# Patient Record
Sex: Male | Born: 1975 | Race: Black or African American | Hispanic: No | Marital: Single | Smoking: Former smoker
Health system: Southern US, Community
[De-identification: ages and names within clinical notes are randomized; demographics above are authoritative.]

## PROBLEM LIST (undated history)

## (undated) DIAGNOSIS — I639 Cerebral infarction, unspecified: Secondary | ICD-10-CM

## (undated) DIAGNOSIS — E119 Type 2 diabetes mellitus without complications: Secondary | ICD-10-CM

## (undated) DIAGNOSIS — I10 Essential (primary) hypertension: Secondary | ICD-10-CM

---

## 2019-08-06 ENCOUNTER — Inpatient Hospital Stay: Admission: RE | Admit: 2019-08-06 | Payer: Self-pay | Source: Ambulatory Visit

## 2019-08-21 ENCOUNTER — Encounter (INDEPENDENT_AMBULATORY_CARE_PROVIDER_SITE_OTHER): Payer: Self-pay | Admitting: Primary Care

## 2019-08-21 ENCOUNTER — Telehealth (INDEPENDENT_AMBULATORY_CARE_PROVIDER_SITE_OTHER): Payer: Self-pay | Admitting: Primary Care

## 2019-08-21 ENCOUNTER — Other Ambulatory Visit: Payer: Self-pay

## 2019-08-21 ENCOUNTER — Ambulatory Visit (INDEPENDENT_AMBULATORY_CARE_PROVIDER_SITE_OTHER): Payer: Self-pay | Admitting: Primary Care

## 2019-08-21 ENCOUNTER — Other Ambulatory Visit (INDEPENDENT_AMBULATORY_CARE_PROVIDER_SITE_OTHER): Payer: Self-pay | Admitting: Primary Care

## 2019-08-21 VITALS — BP 201/145 | HR 97 | Temp 98.2°F | Ht 72.0 in | Wt 273.2 lb

## 2019-08-21 DIAGNOSIS — Z6835 Body mass index (BMI) 35.0-35.9, adult: Secondary | ICD-10-CM

## 2019-08-21 DIAGNOSIS — Z13220 Encounter for screening for lipoid disorders: Secondary | ICD-10-CM

## 2019-08-21 DIAGNOSIS — Z125 Encounter for screening for malignant neoplasm of prostate: Secondary | ICD-10-CM

## 2019-08-21 DIAGNOSIS — Z23 Encounter for immunization: Secondary | ICD-10-CM

## 2019-08-21 DIAGNOSIS — Z7689 Persons encountering health services in other specified circumstances: Secondary | ICD-10-CM

## 2019-08-21 DIAGNOSIS — Z716 Tobacco abuse counseling: Secondary | ICD-10-CM

## 2019-08-21 DIAGNOSIS — I10 Essential (primary) hypertension: Secondary | ICD-10-CM

## 2019-08-21 DIAGNOSIS — F329 Major depressive disorder, single episode, unspecified: Secondary | ICD-10-CM

## 2019-08-21 DIAGNOSIS — Z0000 Encounter for general adult medical examination without abnormal findings: Secondary | ICD-10-CM

## 2019-08-21 DIAGNOSIS — E6609 Other obesity due to excess calories: Secondary | ICD-10-CM

## 2019-08-21 DIAGNOSIS — F32A Depression, unspecified: Secondary | ICD-10-CM

## 2019-08-21 DIAGNOSIS — E669 Obesity, unspecified: Secondary | ICD-10-CM | POA: Insufficient documentation

## 2019-08-21 MED ORDER — AMLODIPINE BESYLATE 10 MG PO TABS: 10.0000 mg | ORAL_TABLET | Freq: Every day | ORAL | 3 refills | Status: DC

## 2019-08-21 MED ORDER — LOSARTAN POTASSIUM-HCTZ 100-25 MG PO TABS: 1.0000 | ORAL_TABLET | Freq: Every day | ORAL | 3 refills | Status: DC

## 2019-08-21 NOTE — Patient Instructions (Signed)
   Managing Your Hypertension Hypertension is commonly called high blood pressure. This is when the force of your blood pressing against the walls of your arteries is too strong. Arteries are blood vessels that carry blood from your heart throughout your body. Hypertension forces the heart to work harder to pump blood, and may cause the arteries to become narrow or stiff. Having untreated or uncontrolled hypertension can cause heart attack, stroke, kidney disease, and other problems. What are blood pressure readings? A blood pressure reading consists of a higher number over a lower number. Ideally, your blood pressure should be below 120/80. The first ("top") number is called the systolic pressure. It is a measure of the pressure in your arteries as your heart beats. The second ("bottom") number is called the diastolic pressure. It is a measure of the pressure in your arteries as the heart relaxes. What does my blood pressure reading mean? Blood pressure is classified into four stages. Based on your blood pressure reading, your health care provider may use the following stages to determine what type of treatment you need, if any. Systolic pressure and diastolic pressure are measured in a unit called mm Hg. Normal  Systolic pressure: below 120.  Diastolic pressure: below 80. Elevated  Systolic pressure: 120-129.  Diastolic pressure: below 80. Hypertension stage 1  Systolic pressure: 130-139.  Diastolic pressure: 80-89. Hypertension stage 2  Systolic pressure: 140 or above.  Diastolic pressure: 90 or above. What health risks are associated with hypertension? Managing your hypertension is an important responsibility. Uncontrolled hypertension can lead to:  A heart attack.  A stroke.  A weakened blood vessel (aneurysm).  Heart failure.  Kidney damage.  Eye damage.  Metabolic syndrome.  Memory and concentration problems. What changes can I make to manage my  hypertension? Hypertension can be managed by making lifestyle changes and possibly by taking medicines. Your health care provider will help you make a plan to bring your blood pressure within a normal range. Eating and drinking   Eat a diet that is high in fiber and potassium, and low in salt (sodium), added sugar, and fat. An example eating plan is called the DASH (Dietary Approaches to Stop Hypertension) diet. To eat this way: ? Eat plenty of fresh fruits and vegetables. Try to fill half of your plate at each meal with fruits and vegetables. ? Eat whole grains, such as whole wheat pasta, brown rice, or whole grain bread. Fill about one quarter of your plate with whole grains. ? Eat low-fat diary products. ? Avoid fatty cuts of meat, processed or cured meats, and poultry with skin. Fill about one quarter of your plate with lean proteins such as fish, chicken without skin, beans, eggs, and tofu. ? Avoid premade and processed foods. These tend to be higher in sodium, added sugar, and fat.  Reduce your daily sodium intake. Most people with hypertension should eat less than 1,500 mg of sodium a day.  Limit alcohol intake to no more than 1 drink a day for nonpregnant women and 2 drinks a day for men. One drink equals 12 oz of beer, 5 oz of wine, or 1 oz of hard liquor. Lifestyle  Work with your health care provider to maintain a healthy body weight, or to lose weight. Ask what an ideal weight is for you.  Get at least 30 minutes of exercise that causes your heart to beat faster (aerobic exercise) most days of the week. Activities may include walking, swimming, or biking.    Include exercise to strengthen your muscles (resistance exercise), such as weight lifting, as part of your weekly exercise routine. Try to do these types of exercises for 30 minutes at least 3 days a week.  Do not use any products that contain nicotine or tobacco, such as cigarettes and e-cigarettes. If you need help quitting,  ask your health care provider.  Control any long-term (chronic) conditions you have, such as high cholesterol or diabetes. Monitoring  Monitor your blood pressure at home as told by your health care provider. Your personal target blood pressure may vary depending on your medical conditions, your age, and other factors.  Have your blood pressure checked regularly, as often as told by your health care provider. Working with your health care provider  Review all the medicines you take with your health care provider because there may be side effects or interactions.  Talk with your health care provider about your diet, exercise habits, and other lifestyle factors that may be contributing to hypertension.  Visit your health care provider regularly. Your health care provider can help you create and adjust your plan for managing hypertension. Will I need medicine to control my blood pressure? Your health care provider may prescribe medicine if lifestyle changes are not enough to get your blood pressure under control, and if:  Your systolic blood pressure is 130 or higher.  Your diastolic blood pressure is 80 or higher. Take medicines only as told by your health care provider. Follow the directions carefully. Blood pressure medicines must be taken as prescribed. The medicine does not work as well when you skip doses. Skipping doses also puts you at risk for problems. Contact a health care provider if:  You think you are having a reaction to medicines you have taken.  You have repeated (recurrent) headaches.  You feel dizzy.  You have swelling in your ankles.  You have trouble with your vision. Get help right away if:  You develop a severe headache or confusion.  You have unusual weakness or numbness, or you feel faint.  You have severe pain in your chest or abdomen.  You vomit repeatedly.  You have trouble breathing. Summary  Hypertension is when the force of blood pumping  through your arteries is too strong. If this condition is not controlled, it may put you at risk for serious complications.  Your personal target blood pressure may vary depending on your medical conditions, your age, and other factors. For most people, a normal blood pressure is less than 120/80.  Hypertension is managed by lifestyle changes, medicines, or both. Lifestyle changes include weight loss, eating a healthy, low-sodium diet, exercising more, and limiting alcohol. This information is not intended to replace advice given to you by your health care provider. Make sure you discuss any questions you have with your health care provider. Document Revised: 04/26/2018 Document Reviewed: 12/01/2015 Elsevier Patient Education  2020 [employer]  

## 2019-08-21 NOTE — Telephone Encounter (Signed)
Medications too expensive  for patient .

## 2019-08-21 NOTE — Telephone Encounter (Signed)
Copied from CRM [id number]. Topic: General - Other >> Aug 21, 2019 10:14 AM [name] wrote: Reason for CRM: Patient called to inform the nurse or doctor that when he went to get his medication from the pharmacy, it was over $300.  He stated he has never paid that much for his medication before.  Please advise and call patient to discuss at [phone]

## 2019-08-21 NOTE — Progress Notes (Signed)
New Patient Office Visit  Subjective:  Patient ID: Nathaniel Spears, male    DOB: 03/03/1975  Age: 44 y.o. MRN: [id number]  CC:  Chief Complaint  Patient presents with  . New Patient (Initial Visit)    hypertension   . Cough    HPI [name] is 44 year old obese male who presents for establishment of care and management of high blood pressure. He denies shortness of breath, headaches, chest pain or lower extremity edema History reviewed. No pertinent past medical history.  History reviewed. No pertinent family history.  Social History   Socioeconomic History  . Marital status: Divorced    Spouse name: Not on file  . Number of children: Not on file  . Years of education: Not on file  . Highest education level: Not on file  Occupational History  . Not on file  Tobacco Use  . Smoking status: Current Every Day Smoker  . Smokeless tobacco: Never Used  Substance and Sexual Activity  . Alcohol use: Not Currently  . Drug use: Never  . Sexual activity: Not Currently  Other Topics Concern  . Not on file  Social History Narrative  . Not on file   Social Determinants of Health   Financial Resource Strain:   . Difficulty of Paying Living Expenses:   Food Insecurity:   . Worried About Charity fundraiser in the Last Year:   . Arboriculturist in the Last Year:   Transportation Needs:   . Film/video editor (Medical):   Marland Kitchen Lack of Transportation (Non-Medical):   Physical Activity:   . Days of Exercise per Week:   . Minutes of Exercise per Session:   Stress:   . Feeling of Stress :   Social Connections:   . Frequency of Communication with Friends and Family:   . Frequency of Social Gatherings with Friends and Family:   . Attends Religious Services:   . Active Member of Clubs or Organizations:   . Attends Archivist Meetings:   Marland Kitchen Marital Status:   Intimate Partner Violence:   . Fear of Current or Ex-Partner:   . Emotionally Abused:   Marland Kitchen Physically  Abused:   . Sexually Abused:     ROS Review of Systems  All other systems reviewed and are negative.   Objective:  BP (!) 201/145 (BP Location: Right Arm, Patient Position: Sitting, Cuff Size: Large)   Pulse 97   Temp 98.2 F (36.8 C) (Oral)   Ht 6' (1.829 m)   Wt 273 lb 3.2 oz (123.9 kg)   SpO2 94%   BMI 37.05 kg/m    Physical Exam Vitals reviewed.  Constitutional:      Appearance: He is obese.  HENT:     Head: Normocephalic.     Right Ear: Tympanic membrane normal.     Left Ear: Tympanic membrane normal.     Nose: Nose normal.  Cardiovascular:     Rate and Rhythm: Normal rate and regular rhythm.     Pulses: Normal pulses.     Heart sounds: Normal heart sounds.  Pulmonary:     Effort: Pulmonary effort is normal.     Breath sounds: Normal breath sounds.  Abdominal:     General: Bowel sounds are normal. There is distension.  Musculoskeletal:        General: Normal range of motion.     Cervical back: Normal range of motion and neck supple.  Skin:    General:  Skin is warm and dry.  Neurological:     Mental Status: He is alert and oriented to person, place, and time.    Assessment & Plan:  Nathaniel Spears was seen today for new patient (initial visit) and cough.  Diagnoses and all orders for this visit:  Encounter to establish care Juluis Mire, NP-C will be your  (PCP) she is mastered prepared . Able to diagnosed and treatment also  answer health concern as well as continuing care of varied medical conditions, not limited by cause, organ system, or diagnosis.   Need for Tdap vaccination -     Cancel: Tdap vaccine greater than or equal to 7yo IM  Depression, unspecified depression type Recently divorces and moved from [place]. Transition alone and new environment . He has a job interview this week he is looking forward too   Office Visit from 08/21/2019 in [facility]  PHQ-9 Total Score 18      Lipid screening -     Lipid Panel;  Future   Screening PSA (prostate specific antigen Prostrate Cancer Screening For men aged 44 to 56 years, the decision to undergo periodic prostate-specific antigen (PSA)-based screening for prostate cancer  -     PSA; Future   Essential hypertension Counseled on blood pressure goal of less than 130/80, low-sodium, DASH diet, medication compliance, 150 minutes of moderate intensity exercise per week. Discussed medication compliance, adverse effects. -     CBC with Differential; Future -     CMP14+EGFR; Future -     amLODipine (NORVASC) 10 MG tablet; Take 1 tablet (10 mg total) by mouth daily. -     losartan-hydrochlorothiazide (HYZAAR) 100-25 MG tablet; Take 1 tablet by mouth daily. -     CMP14+EGFR -     CBC with Differential  Tobacco abuse counseling  He is aware of Increased risk for lung cancer and other respiratory diseases recommend cessation.  This will be reminded at each clinical visit. Not helping with uncontrolled Bp either   Healthcare maintenance -     Hepatitis C Antibody; Future -     HIV Antibody (routine testing w rflx); Future -     HIV Antibody (routine testing w rflx) -     Hepatitis C Antibody  Class 2 obesity due to excess calories without serious comorbidity with body mass index (BMI) of 35.0 to 35.9 in adult Obesity is 30-39 indicating an excess in caloric intake or underlining conditions. This may lead to other co-morbidities. Lifestyle modifications of diet and exercise may reduce obesity.  AA, male, obese uncontrolled HTN risk for heart attack or stroke or both    Follow-up: Return in about 8 weeks (around 10/16/2019) for in person BP/fasting labs.   Kerin Perna, NP

## 2019-08-22 ENCOUNTER — Other Ambulatory Visit (INDEPENDENT_AMBULATORY_CARE_PROVIDER_SITE_OTHER): Payer: Self-pay | Admitting: Primary Care

## 2019-08-22 DIAGNOSIS — I10 Essential (primary) hypertension: Secondary | ICD-10-CM

## 2019-08-22 MED ORDER — HYDROCHLOROTHIAZIDE 25 MG PO TABS: 25.0000 mg | ORAL_TABLET | Freq: Every day | ORAL | 3 refills | Status: DC

## 2019-08-22 MED ORDER — LOSARTAN POTASSIUM 100 MG PO TABS: 100.0000 mg | ORAL_TABLET | Freq: Every day | ORAL | 3 refills | Status: DC

## 2019-08-25 MED FILL — AMLODIPINE BESYLATE 10 MG T: 10 | 30 days supply | Qty: 30 | Fill #0

## 2019-08-25 MED FILL — LOSARTAN-HCTZ 100-25 MG TAB: 100-25 | 30 days supply | Qty: 30 | Fill #0

## 2019-08-25 NOTE — Telephone Encounter (Signed)
Patient contacted [facility] and had them transfer prescriptions to [facility].

## 2019-10-01 MED FILL — LOSARTAN-HCTZ 100-25 MG TAB: 100-25 | 30 days supply | Qty: 30 | Fill #1

## 2019-10-01 MED FILL — AMLODIPINE BESYLATE 10 MG T: 10 | 30 days supply | Qty: 30 | Fill #1

## 2019-10-08 MED FILL — LOSARTAN-HCTZ 100-25 MG TAB: 100-25 | 30 days supply | Qty: 30 | Fill #1

## 2019-10-08 MED FILL — AMLODIPINE BESYLATE 10 MG T: 10 | 30 days supply | Qty: 30 | Fill #1

## 2019-10-16 ENCOUNTER — Ambulatory Visit (INDEPENDENT_AMBULATORY_CARE_PROVIDER_SITE_OTHER): Payer: Self-pay | Admitting: Primary Care

## 2019-10-28 ENCOUNTER — Ambulatory Visit (INDEPENDENT_AMBULATORY_CARE_PROVIDER_SITE_OTHER): Payer: Self-pay | Admitting: Primary Care

## 2019-12-16 MED FILL — LOSARTAN POTASSIUM-HCTZ 100: 100-25 | 30 days supply | Qty: 30 | Fill #2

## 2019-12-16 MED FILL — AMLODIPINE BESYLATE 10 MG T: 10 | 30 days supply | Qty: 30 | Fill #2

## 2020-01-27 MED FILL — LOSARTAN POTASSIUM-HCTZ 100: 100-25 | 30 days supply | Qty: 30 | Fill #3

## 2020-01-27 MED FILL — AMLODIPINE BESYLATE 10 MG T: 10 | 30 days supply | Qty: 30 | Fill #3

## 2020-02-10 ENCOUNTER — Emergency Department (HOSPITAL_COMMUNITY): Payer: No Typology Code available for payment source

## 2020-02-10 ENCOUNTER — Emergency Department (HOSPITAL_COMMUNITY)
Admission: EM | Admit: 2020-02-10 | Discharge: 2020-02-11 | Disposition: A | Discharge status: Home / Self Care | Payer: No Typology Code available for payment source | Attending: Emergency Medicine | Admitting: Emergency Medicine

## 2020-02-10 ENCOUNTER — Other Ambulatory Visit: Payer: Self-pay

## 2020-02-10 DIAGNOSIS — Z5321 Procedure and treatment not carried out due to patient leaving prior to being seen by health care provider: Secondary | ICD-10-CM | POA: Insufficient documentation

## 2020-02-10 DIAGNOSIS — R42 Dizziness and giddiness: Secondary | ICD-10-CM | POA: Diagnosis not present

## 2020-02-10 DIAGNOSIS — R519 Headache, unspecified: Secondary | ICD-10-CM | POA: Diagnosis not present

## 2020-02-10 LAB — CBC
HCT: 47.1 % (ref 39.0–52.0)
Hemoglobin: 14.9 g/dL (ref 13.0–17.0)
MCH: 26.2 pg (ref 26.0–34.0)
MCHC: 31.6 g/dL (ref 30.0–36.0)
MCV: 82.9 fL (ref 80.0–100.0)
Platelets: 219 10*3/uL (ref 150–400)
RBC: 5.68 MIL/uL (ref 4.22–5.81)
RDW: 12.9 % (ref 11.5–15.5)
WBC: 4.9 10*3/uL (ref 4.0–10.5)
nRBC: 0 % (ref 0.0–0.2)

## 2020-02-10 LAB — COMPREHENSIVE METABOLIC PANEL
ALT: 20 U/L (ref 0–44)
AST: 20 U/L (ref 15–41)
Albumin: 3.8 g/dL (ref 3.5–5.0)
Alkaline Phosphatase: 74 U/L (ref 38–126)
Anion gap: 12 (ref 5–15)
BUN: 8 mg/dL (ref 6–20)
CO2: 25 mmol/L (ref 22–32)
Calcium: 9 mg/dL (ref 8.9–10.3)
Chloride: 103 mmol/L (ref 98–111)
Creatinine, Ser: 1.08 mg/dL (ref 0.61–1.24)
GFR, Estimated: >60 mL/min (ref >60)
Glucose, Bld: 191 mg/dL — ABNORMAL HIGH (ref 70–99)
Potassium: 3.3 mmol/L — ABNORMAL LOW (ref 3.5–5.1)
Sodium: 140 mmol/L (ref 135–145)
Total Bilirubin: 0.5 mg/dL (ref 0.3–1.2)
Total Protein: 7.4 g/dL (ref 6.5–8.1)

## 2020-02-10 LAB — APTT: aPTT: 27 seconds (ref 24–36)

## 2020-02-10 LAB — DIFFERENTIAL
Abs Immature Granulocytes: 0.01 10*3/uL (ref 0.00–0.07)
Basophils Absolute: 0 10*3/uL (ref 0.0–0.1)
Basophils Relative: 0 %
Eosinophils Absolute: 0.1 10*3/uL (ref 0.0–0.5)
Eosinophils Relative: 2 %
Immature Granulocytes: 0 %
Lymphocytes Relative: 54 %
Lymphs Abs: 2.6 10*3/uL (ref 0.7–4.0)
Monocytes Absolute: 0.3 10*3/uL (ref 0.1–1.0)
Monocytes Relative: 7 %
Neutro Abs: 1.8 10*3/uL (ref 1.7–7.7)
Neutrophils Relative %: 37 %

## 2020-02-10 LAB — PROTIME-INR
INR: 1 (ref 0.8–1.2)
Prothrombin Time: 12.5 seconds (ref 11.4–15.2)

## 2020-02-10 IMAGING — CT CT HEAD W/O CM
3 series · 15 of 47 positions shown, 18 images · non-contrast
Comparison: None.

CLINICAL DATA: Headache, dizziness

EXAM:
CT HEAD WITHOUT CONTRAST
TECHNIQUE: Contiguous axial images were obtained from the base of the skull
through the vertex without intravenous contrast.

[Series 3: head 5.0 h30s · axial · 0.44mm/px · z∈[-344,-209]mm · 9 of 33 slices shown, 12 images]
[im 3/33  brain]
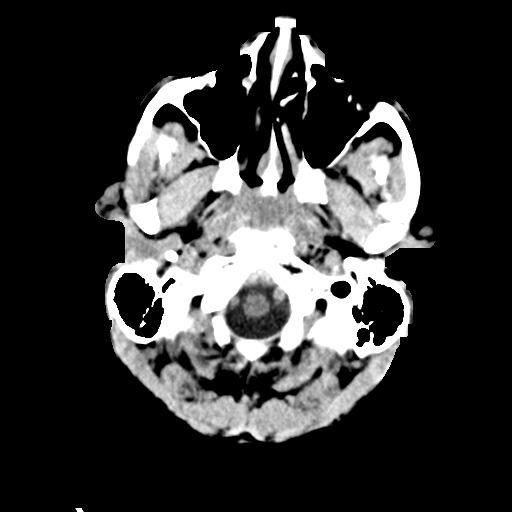
[im 3/33  bone]
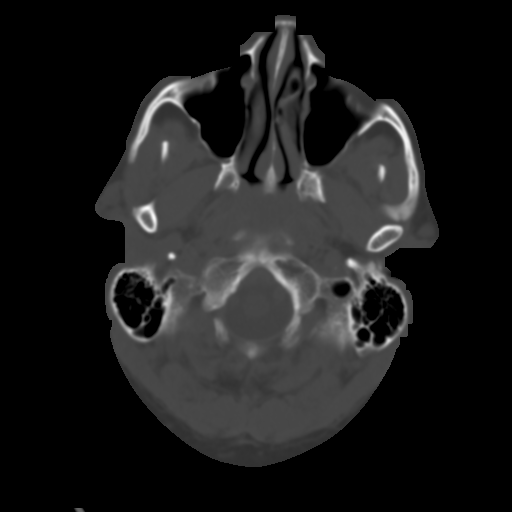
[im 6/33  brain]
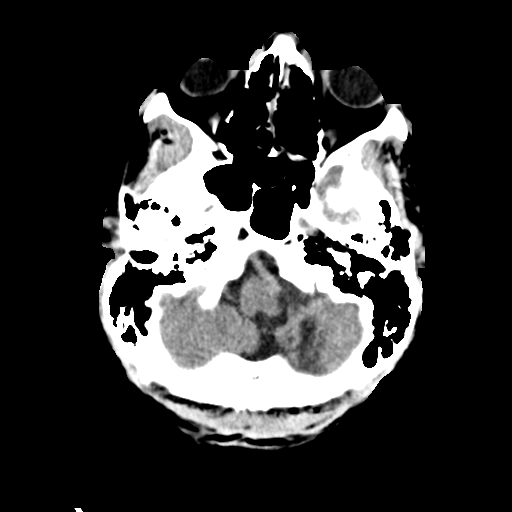
[im 9/33  brain]
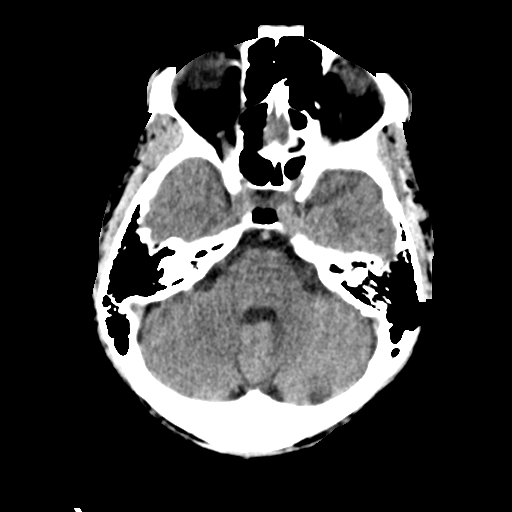
[im 13/33  brain]
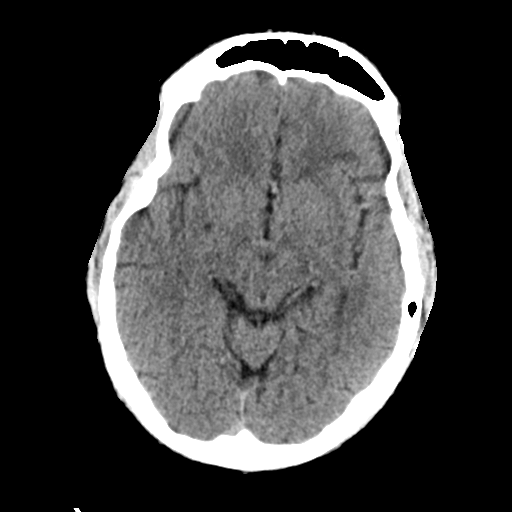
[im 17/33  brain]
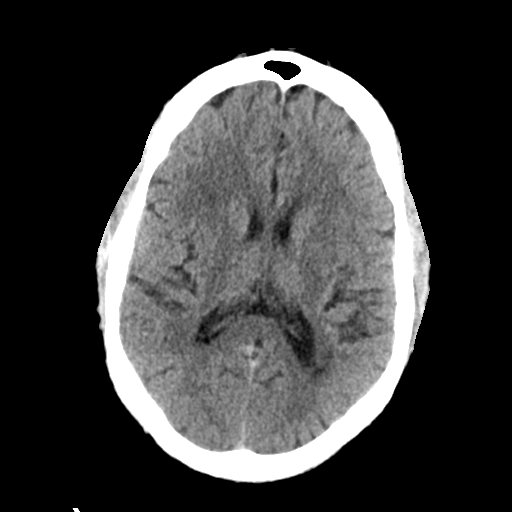
[im 17/33  bone]
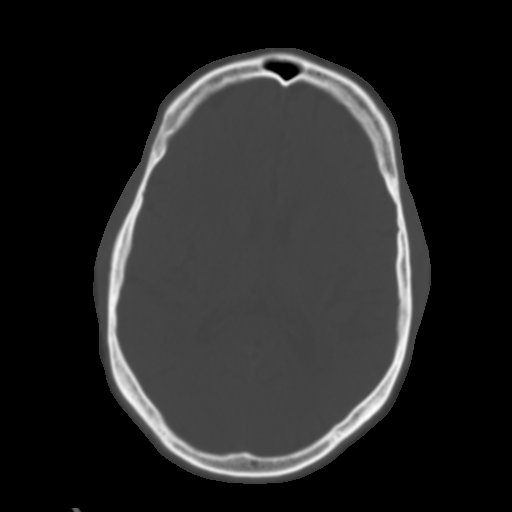
[im 20/33  brain]
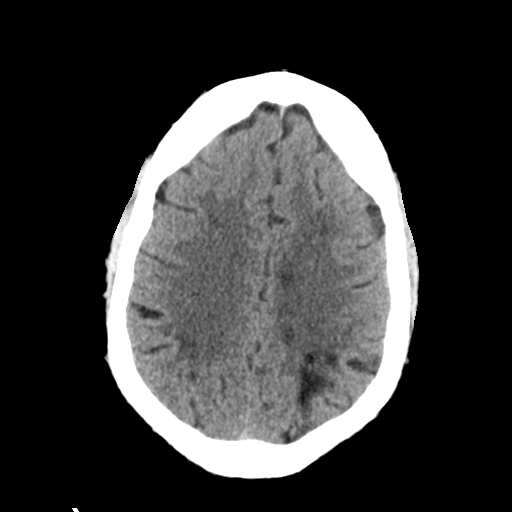
[im 24/33  brain]
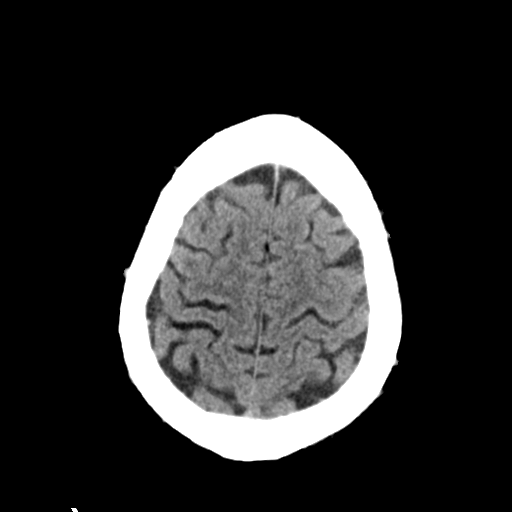
[im 27/33  brain]
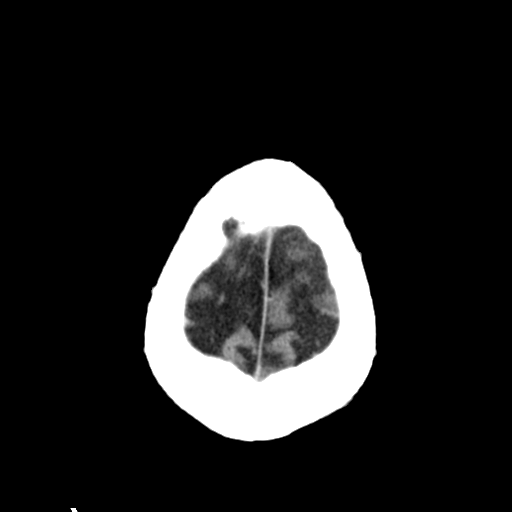
[im 30/33  brain]
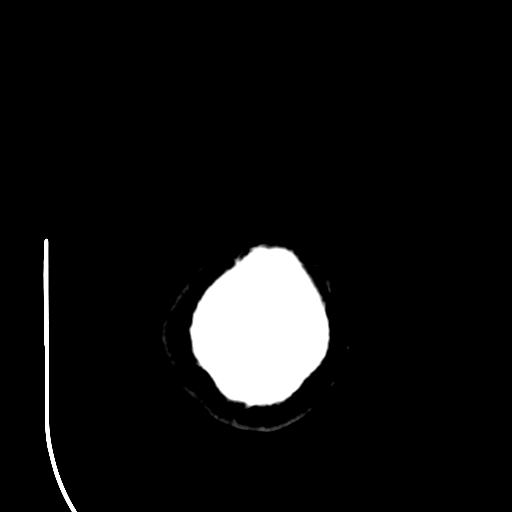
[im 30/33  bone]
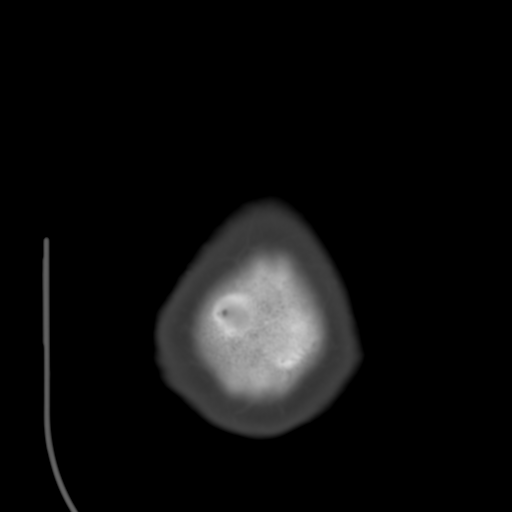

[Series 5: head 3.0 mpr cor · coronal · 0.31mm/px · 3 of 75 slices shown]
[im 25/75  brain]
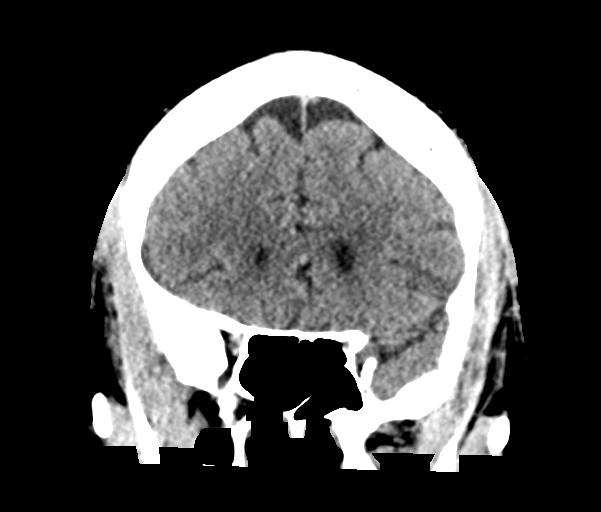
[im 33/75  brain]
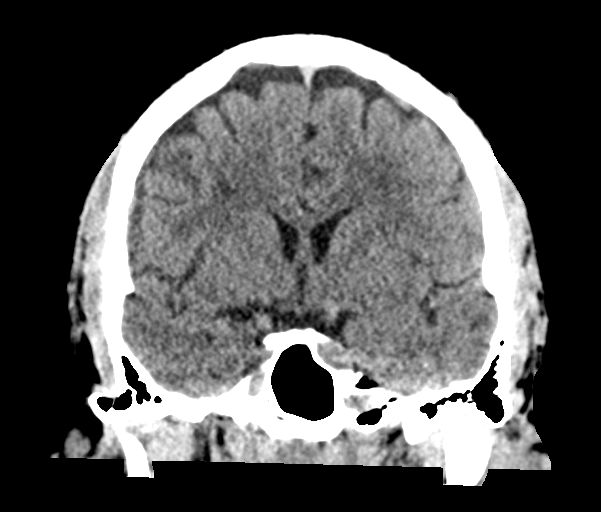
[im 42/75  brain]
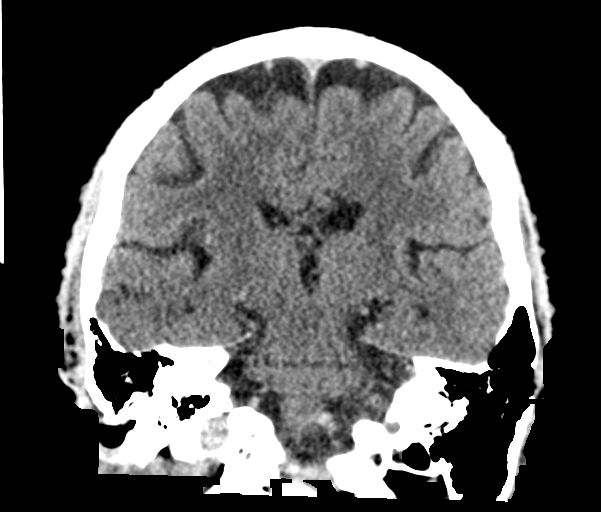

[Series 6: head 3.0 mpr sag · sagittal · 0.31mm/px · 3 of 64 slices shown]
[im 22/64  brain]
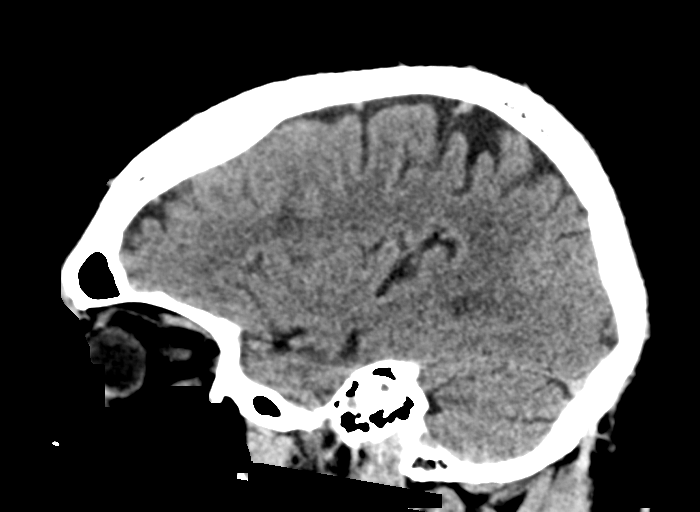
[im 32/64  brain]
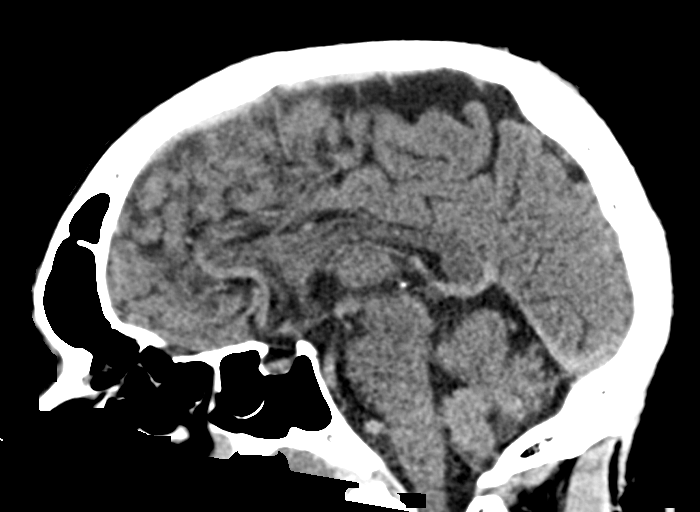
[im 43/64  brain]
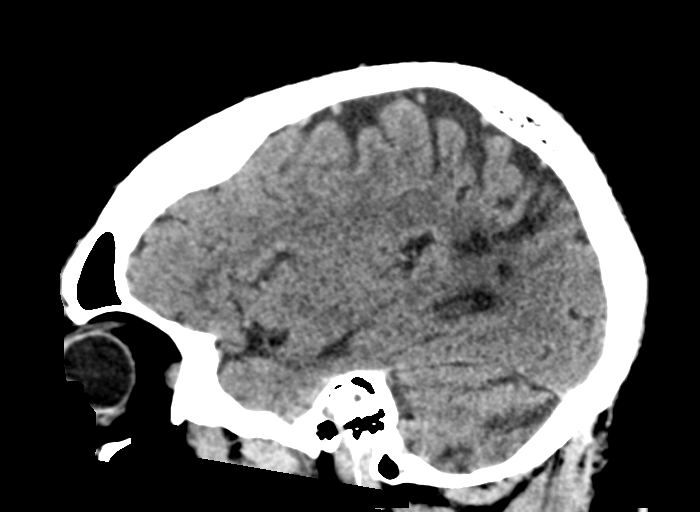

[15 of 47 positions shown; findings below may reference images not displayed]

FINDINGS: Brain: Old left posterior parietal infarct. Bilateral
periventricular lacunar infarcts. Old left cerebellar infarct.
Chronic small vessel disease throughout the deep white matter. No
acute intracranial abnormality. Specifically, no hemorrhage,
hydrocephalus, mass lesion, acute infarction, or significant
intracranial injury.

Vascular: No hyperdense vessel or unexpected calcification.

Skull: No acute calvarial abnormality.

Sinuses/Orbits: Visualized paranasal sinuses and mastoids clear.
Orbital soft tissues unremarkable.

Other: None
IMPRESSION: Old left posterior parietal infarct, left cerebellar infarct and
chronic bilateral periventricular lacunar infarcts.

Chronic small vessel disease.

No acute intracranial abnormality.

## 2020-02-10 MED ORDER — SODIUM CHLORIDE 0.9% FLUSH: 3.0000 mL | Freq: Once | INTRAVENOUS | Status: DC

## 2020-02-10 NOTE — ED Triage Notes (Addendum)
Pt c/o headache and dizziness that started last night before going to bed. Pt also states he feels like his speech is abnormal for him and that he bit his tongue at some point in the night. A&O x 4, no weakness, facial droop noted at this time. Hypertensive in triage, denies hx seizures.

## 2020-02-11 NOTE — ED Notes (Signed)
Patient called twice for vitals recheck with no response

## 2020-02-13 ENCOUNTER — Encounter (INDEPENDENT_AMBULATORY_CARE_PROVIDER_SITE_OTHER): Payer: Self-pay | Admitting: Primary Care

## 2020-02-13 ENCOUNTER — Telehealth (INDEPENDENT_AMBULATORY_CARE_PROVIDER_SITE_OTHER): Payer: No Typology Code available for payment source | Admitting: Primary Care

## 2020-02-13 ENCOUNTER — Other Ambulatory Visit: Payer: Self-pay

## 2020-02-13 DIAGNOSIS — R519 Headache, unspecified: Secondary | ICD-10-CM | POA: Diagnosis not present

## 2020-02-13 NOTE — Progress Notes (Signed)
Telephone Note  I connected with Nathaniel Spears on 02/13/20 at 10:30 AM EST by telephone and verified that I am speaking with the correct person using two identifiers.  Location: Patient: home Provider: Grayce Sessions @[facility]    I discussed the limitations, risks, security and privacy concerns of performing an evaluation and management service by telephone and the availability of in person appointments. I also discussed with the patient that there may be a patient responsible charge related to this service. The patient expressed understanding and agreed to proceed.   History of Present Illness: [name] is a 45 year old male who has been having intense headaches frontal the worst ever had he presented to the ED stayed 16 hrs and left. Asked patient who was he hurting he still has the same problem. He has complaints for 3 days of headache, dizzy , new resent symptoms started with headache drooling on self and continue of intentional biting of his tongue.   Location of pain is frontal 5/10 sent to ED. He has a hx of HTN.   No past medical history on file.  Current Outpatient Medications on File Prior to Visit  Medication Sig Dispense Refill  . amLODipine (NORVASC) 10 MG tablet Take 1 tablet (10 mg total) by mouth daily. 90 tablet 3  . losartan-hydrochlorothiazide (HYZAAR) 100-25 MG tablet Take 1 tablet by mouth daily.     No current facility-administered medications on file prior to visit.   Observations/Objective: There were no vitals taken for this visit. Pertinent positive and negative are noted in HPI   Assessment and Plan: Nathaniel Spears was seen today for aphasia.  Diagnoses and all orders for this visit:  Nonintractable headache, unspecified chronicity pattern, unspecified headache type New onset of headaches for 3 days with new symptoms of drooling on self and biting of his tone. Unclear if neurological underlying causes TIA/Stroke. SENT TO ED and voiced concerns of  TIA/Strokes/seizures new onset of symptoms.   Follow Up Instructions:    I discussed the assessment and treatment plan with the patient. The patient was provided an opportunity to ask questions and all were answered. The patient agreed with the plan and demonstrated an understanding of the instructions.   The patient was advised to call back or seek an in-person evaluation if the symptoms worsen or if the condition fails to improve as anticipated.  I provided 10 minutes of non-face-to-face time during this encounter.   59, NP

## 2020-02-13 NOTE — Progress Notes (Signed)
Drooling on self  Keeps biting tongue  Denies pain

## 2020-02-25 MED FILL — AMLODIPINE BESYLATE 10 MG T: 10 | 30 days supply | Qty: 30 | Fill #3

## 2020-02-25 MED FILL — LOSARTAN POTASSIUM-HCTZ 100: 100-25 | 30 days supply | Qty: 30 | Fill #3

## 2020-04-17 ENCOUNTER — Other Ambulatory Visit: Payer: Self-pay

## 2020-04-20 ENCOUNTER — Encounter (HOSPITAL_COMMUNITY): Payer: Self-pay

## 2020-04-20 ENCOUNTER — Emergency Department (HOSPITAL_COMMUNITY): Payer: Medicaid Other

## 2020-04-20 ENCOUNTER — Inpatient Hospital Stay (HOSPITAL_COMMUNITY)
Admission: EM | Admit: 2020-04-20 | Discharge: 2020-04-23 | DRG: 065 | Disposition: A | Discharge status: Home Health | Payer: Medicaid Other | Attending: Internal Medicine | Admitting: Internal Medicine

## 2020-04-20 ENCOUNTER — Other Ambulatory Visit: Payer: Self-pay

## 2020-04-20 DIAGNOSIS — R29708 NIHSS score 8: Secondary | ICD-10-CM | POA: Diagnosis not present

## 2020-04-20 DIAGNOSIS — I639 Cerebral infarction, unspecified: Secondary | ICD-10-CM | POA: Diagnosis not present

## 2020-04-20 DIAGNOSIS — E119 Type 2 diabetes mellitus without complications: Secondary | ICD-10-CM

## 2020-04-20 DIAGNOSIS — E669 Obesity, unspecified: Secondary | ICD-10-CM | POA: Diagnosis not present

## 2020-04-20 DIAGNOSIS — E1165 Type 2 diabetes mellitus with hyperglycemia: Secondary | ICD-10-CM | POA: Diagnosis not present

## 2020-04-20 DIAGNOSIS — E785 Hyperlipidemia, unspecified: Secondary | ICD-10-CM | POA: Diagnosis not present

## 2020-04-20 DIAGNOSIS — Z20822 Contact with and (suspected) exposure to covid-19: Secondary | ICD-10-CM | POA: Diagnosis not present

## 2020-04-20 DIAGNOSIS — R29705 NIHSS score 5: Secondary | ICD-10-CM | POA: Diagnosis present

## 2020-04-20 DIAGNOSIS — Z6832 Body mass index (BMI) 32.0-32.9, adult: Secondary | ICD-10-CM

## 2020-04-20 DIAGNOSIS — F1410 Cocaine abuse, uncomplicated: Secondary | ICD-10-CM | POA: Diagnosis not present

## 2020-04-20 DIAGNOSIS — F17210 Nicotine dependence, cigarettes, uncomplicated: Secondary | ICD-10-CM | POA: Diagnosis not present

## 2020-04-20 DIAGNOSIS — E876 Hypokalemia: Secondary | ICD-10-CM | POA: Diagnosis present

## 2020-04-20 DIAGNOSIS — I63512 Cerebral infarction due to unspecified occlusion or stenosis of left middle cerebral artery: Secondary | ICD-10-CM | POA: Diagnosis not present

## 2020-04-20 DIAGNOSIS — I161 Hypertensive emergency: Secondary | ICD-10-CM | POA: Diagnosis present

## 2020-04-20 DIAGNOSIS — I10 Essential (primary) hypertension: Secondary | ICD-10-CM | POA: Diagnosis present

## 2020-04-20 DIAGNOSIS — E1169 Type 2 diabetes mellitus with other specified complication: Secondary | ICD-10-CM

## 2020-04-20 DIAGNOSIS — F1910 Other psychoactive substance abuse, uncomplicated: Secondary | ICD-10-CM

## 2020-04-20 DIAGNOSIS — R4701 Aphasia: Secondary | ICD-10-CM | POA: Diagnosis present

## 2020-04-20 DIAGNOSIS — Z79899 Other long term (current) drug therapy: Secondary | ICD-10-CM

## 2020-04-20 DIAGNOSIS — R29810 Facial weakness: Secondary | ICD-10-CM | POA: Diagnosis not present

## 2020-04-20 DIAGNOSIS — R29706 NIHSS score 6: Secondary | ICD-10-CM | POA: Diagnosis not present

## 2020-04-20 DIAGNOSIS — Z794 Long term (current) use of insulin: Secondary | ICD-10-CM

## 2020-04-20 HISTORY — DX: Essential (primary) hypertension: I10

## 2020-04-20 LAB — CBC
HCT: 46.2 % (ref 39.0–52.0)
HCT: 44.4 % (ref 39.0–52.0)
Hemoglobin: 15.2 g/dL (ref 13.0–17.0)
Hemoglobin: 14.5 g/dL (ref 13.0–17.0)
MCH: 26.9 pg (ref 26.0–34.0)
MCH: 26.3 pg (ref 26.0–34.0)
MCHC: 32.9 g/dL (ref 30.0–36.0)
MCHC: 32.7 g/dL (ref 30.0–36.0)
MCV: 81.8 fL (ref 80.0–100.0)
MCV: 80.6 fL (ref 80.0–100.0)
Platelets: 206 10*3/uL (ref 150–400)
Platelets: 179 10*3/uL (ref 150–400)
RBC: 5.65 MIL/uL (ref 4.22–5.81)
RBC: 5.51 MIL/uL (ref 4.22–5.81)
RDW: 12.5 % (ref 11.5–15.5)
RDW: 12.3 % (ref 11.5–15.5)
WBC: 7.9 10*3/uL (ref 4.0–10.5)
WBC: 8 10*3/uL (ref 4.0–10.5)
nRBC: 0 % (ref 0.0–0.2)
nRBC: 0 % (ref 0.0–0.2)

## 2020-04-20 LAB — URINALYSIS, ROUTINE W REFLEX MICROSCOPIC
Bacteria, UA: NONE SEEN
Bilirubin Urine: NEGATIVE
Glucose, UA: >=500 mg/dL — AB
Hgb urine dipstick: NEGATIVE
Ketones, ur: 80 mg/dL — AB
Leukocytes,Ua: NEGATIVE
Nitrite: NEGATIVE
Protein, ur: 30 mg/dL — AB
Specific Gravity, Urine: 1.026 (ref 1.005–1.030)
pH: 6 (ref 5.0–8.0)

## 2020-04-20 LAB — BASIC METABOLIC PANEL
Anion gap: 8 (ref 5–15)
BUN: 9 mg/dL (ref 6–20)
CO2: 25 mmol/L (ref 22–32)
Calcium: 8.5 mg/dL — ABNORMAL LOW (ref 8.9–10.3)
Chloride: 102 mmol/L (ref 98–111)
Creatinine, Ser: 0.86 mg/dL (ref 0.61–1.24)
GFR, Estimated: >60 mL/min (ref >60)
Glucose, Bld: 195 mg/dL — ABNORMAL HIGH (ref 70–99)
Potassium: 3.1 mmol/L — ABNORMAL LOW (ref 3.5–5.1)
Sodium: 135 mmol/L (ref 135–145)

## 2020-04-20 LAB — COMPREHENSIVE METABOLIC PANEL
ALT: 18 U/L (ref 0–44)
AST: 18 U/L (ref 15–41)
Albumin: 4.1 g/dL (ref 3.5–5.0)
Alkaline Phosphatase: 86 U/L (ref 38–126)
Anion gap: 12 (ref 5–15)
BUN: 11 mg/dL (ref 6–20)
CO2: 28 mmol/L (ref 22–32)
Calcium: 8.8 mg/dL — ABNORMAL LOW (ref 8.9–10.3)
Chloride: 99 mmol/L (ref 98–111)
Creatinine, Ser: 1.1 mg/dL (ref 0.61–1.24)
GFR, Estimated: >60 mL/min (ref >60)
Glucose, Bld: 267 mg/dL — ABNORMAL HIGH (ref 70–99)
Potassium: 3.3 mmol/L — ABNORMAL LOW (ref 3.5–5.1)
Sodium: 139 mmol/L (ref 135–145)
Total Bilirubin: 1.2 mg/dL (ref 0.3–1.2)
Total Protein: 7.7 g/dL (ref 6.5–8.1)

## 2020-04-20 LAB — LIPID PANEL
Cholesterol: 103 mg/dL (ref 0–200)
HDL: 26 mg/dL — ABNORMAL LOW (ref >40)
LDL Cholesterol: 53 mg/dL (ref 0–99)
Total CHOL/HDL Ratio: 4 RATIO
Triglycerides: 118 mg/dL (ref <150)
VLDL: 24 mg/dL (ref 0–40)

## 2020-04-20 LAB — DIFFERENTIAL
Abs Immature Granulocytes: 0.02 10*3/uL (ref 0.00–0.07)
Basophils Absolute: 0 10*3/uL (ref 0.0–0.1)
Basophils Relative: 0 %
Eosinophils Absolute: 0.1 10*3/uL (ref 0.0–0.5)
Eosinophils Relative: 2 %
Immature Granulocytes: 0 %
Lymphocytes Relative: 30 %
Lymphs Abs: 2.4 10*3/uL (ref 0.7–4.0)
Monocytes Absolute: 0.6 10*3/uL (ref 0.1–1.0)
Monocytes Relative: 8 %
Neutro Abs: 4.8 10*3/uL (ref 1.7–7.7)
Neutrophils Relative %: 60 %

## 2020-04-20 LAB — HEMOGLOBIN A1C
Hgb A1c MFr Bld: 11.1 % — ABNORMAL HIGH (ref 4.8–5.6)
Mean Plasma Glucose: 271.87 mg/dL

## 2020-04-20 LAB — RAPID URINE DRUG SCREEN, HOSP PERFORMED
Amphetamines: NOT DETECTED
Barbiturates: NOT DETECTED
Benzodiazepines: NOT DETECTED
Cocaine: POSITIVE — AB
Opiates: NOT DETECTED
Tetrahydrocannabinol: NOT DETECTED

## 2020-04-20 LAB — ETHANOL: Alcohol, Ethyl (B): <10 mg/dL (ref <10)

## 2020-04-20 LAB — RESP PANEL BY RT-PCR (FLU A&B, COVID) ARPGX2
Influenza A by PCR: NEGATIVE
Influenza B by PCR: NEGATIVE
SARS Coronavirus 2 by RT PCR: NEGATIVE

## 2020-04-20 LAB — PROTIME-INR
INR: 1.1 (ref 0.8–1.2)
Prothrombin Time: 13.3 seconds (ref 11.4–15.2)

## 2020-04-20 LAB — APTT: aPTT: 28 seconds (ref 24–36)

## 2020-04-20 LAB — AMMONIA: Ammonia: 20 umol/L (ref 9–35)

## 2020-04-20 LAB — MAGNESIUM: Magnesium: 1.8 mg/dL (ref 1.7–2.4)

## 2020-04-20 IMAGING — MR MR MRA HEAD W/O CM
3 series · 22 of 48 positions shown · non-contrast
Comparison: CT head [DATE]

CLINICAL DATA: Acute neuro deficit.  Aphasia

EXAM:
MRI HEAD WITHOUT CONTRAST
MRA HEAD WITHOUT CONTRAST
TECHNIQUE: Multiplanar, multiecho pulse sequences of the brain and surrounding
structures were obtained without intravenous contrast. Angiographic
images of the head were obtained using MRA technique without
contrast.

[Series 12: vessel_scout_head_msum · sagittal · 6.0mm · 0.59mm/px · 3 of 14 slices shown]
[im 1/14]
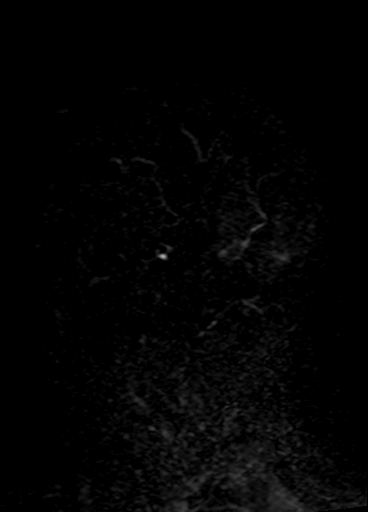
[im 7/14]
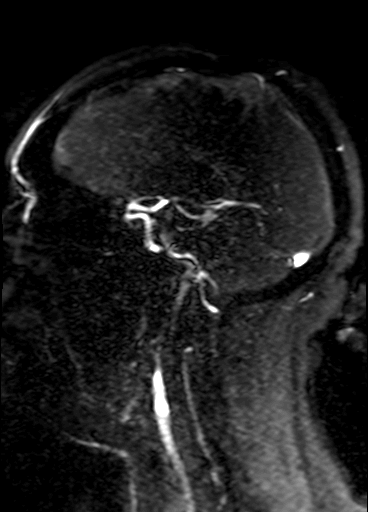
[im 14/14]
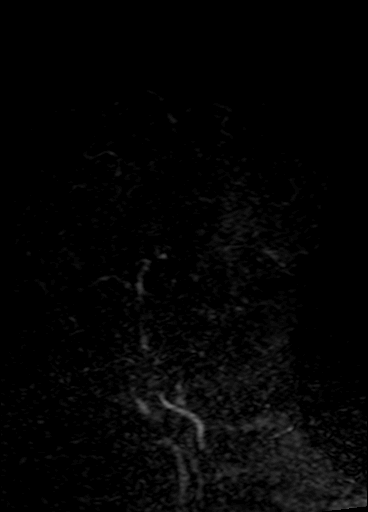

[Series 15: TOF · axial · 0.6mm · 0.35mm/px · z∈[-22,+76]mm · 18 of 172 slices shown (1 of 2)]
[im 1/172]
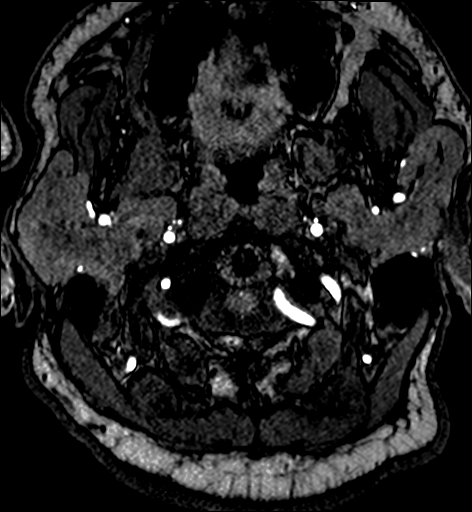
[im 4/172]
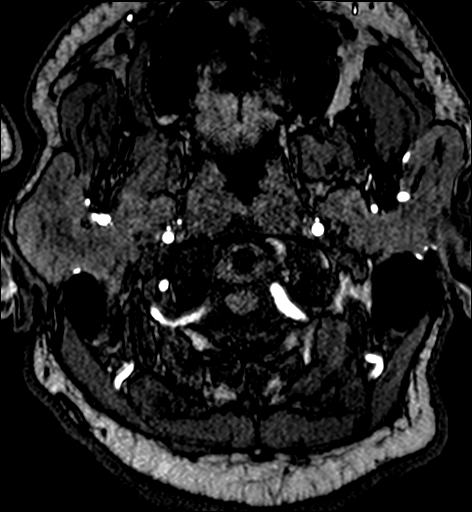
[im 8/172]
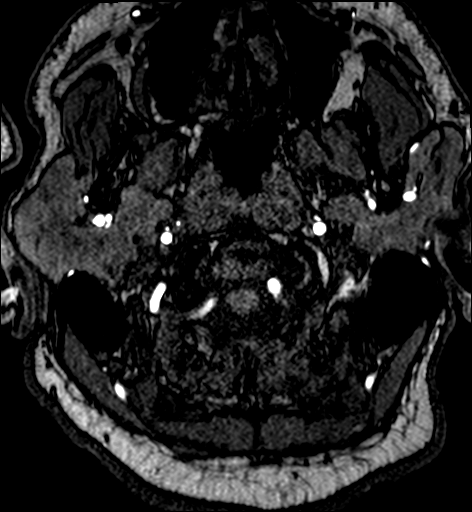
[im 12/172]
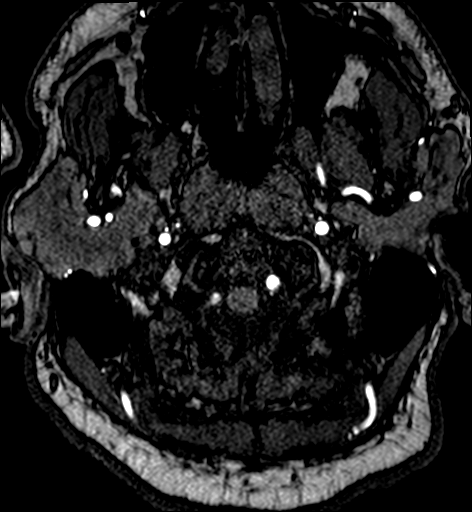
[im 16/172]
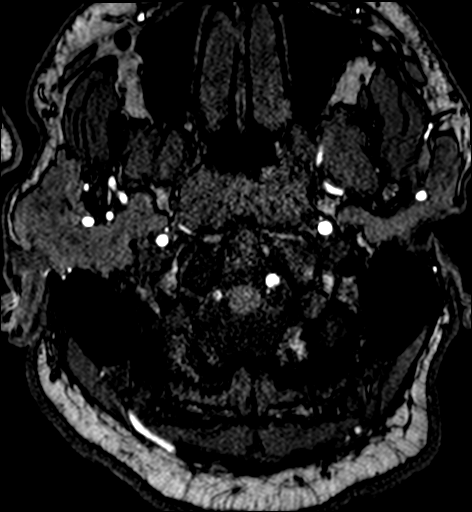
[im 20/172]
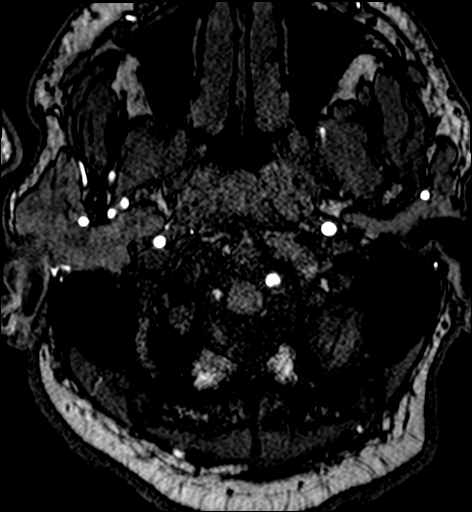
[im 24/172]
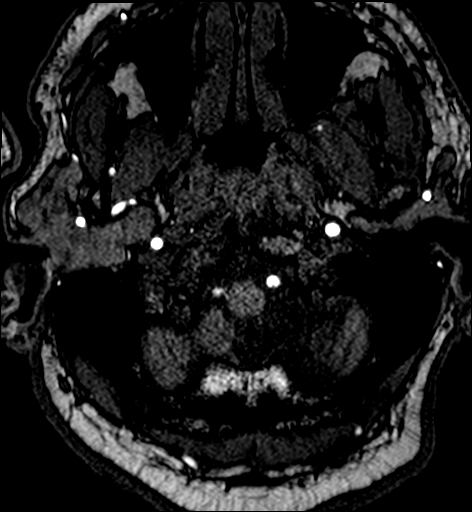
[im 28/172]
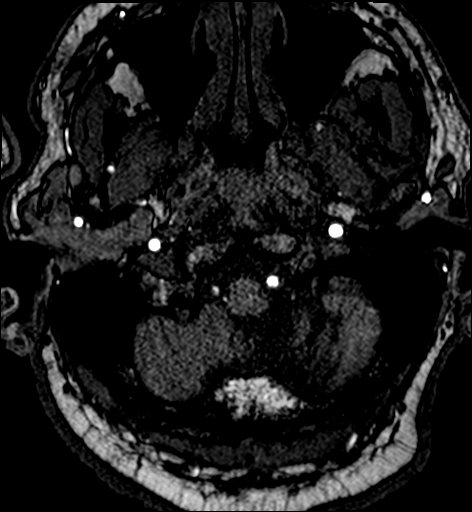
[im 32/172]
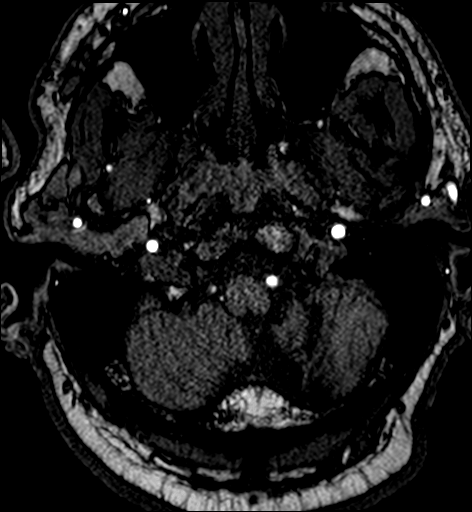
[im 36/172]
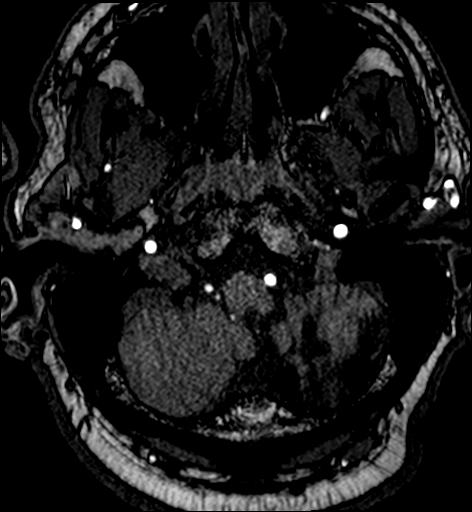
[im 52/172]
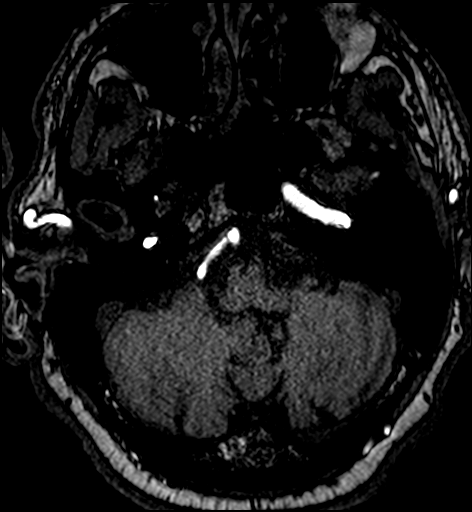
[im 76/172]
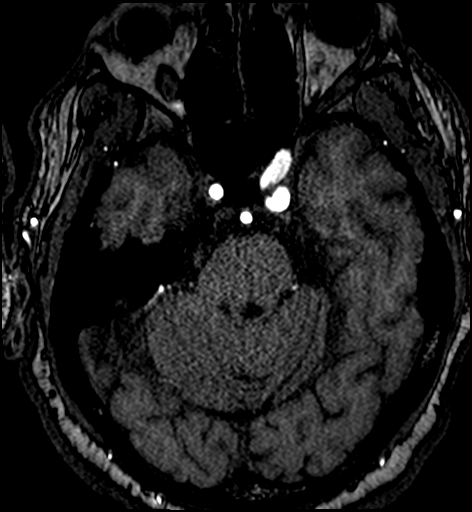
[im 88/172]
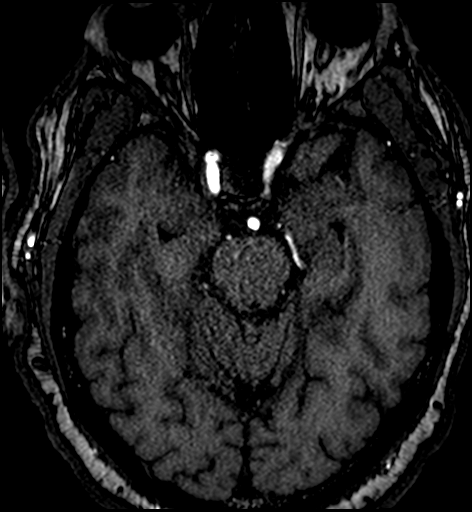
[im 96/172]
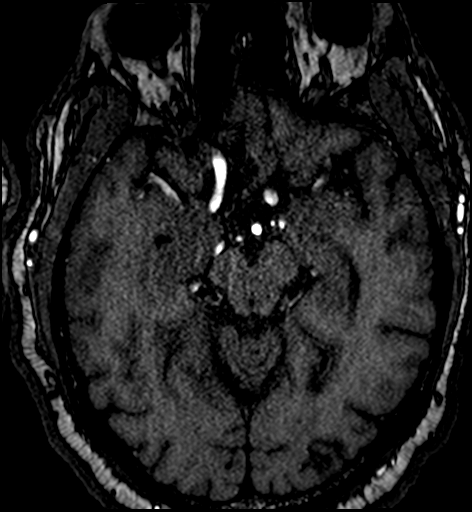
[im 120/172]
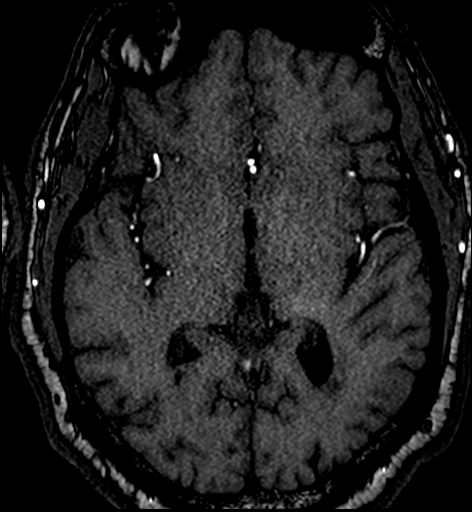
[im 140/172]
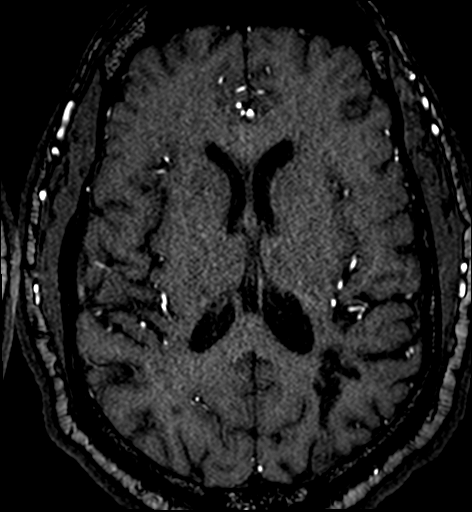
[im 144/172]
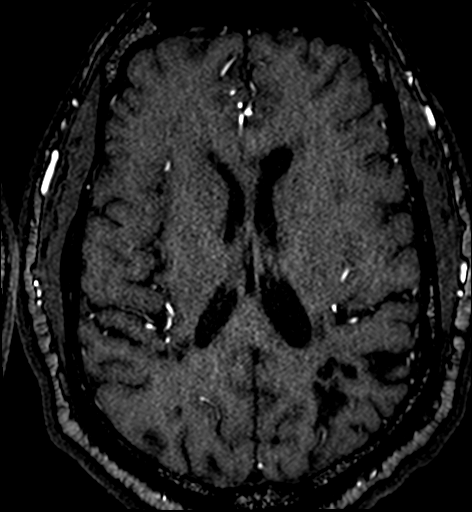
[im 164/172]
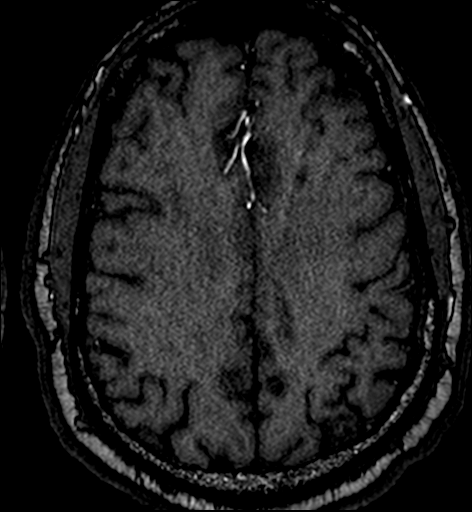

[Series 18: TOF · axial · 103.2mm · 0.35mm/px · 1 of 1 slices shown (2 of 2)]
[im 1/1]
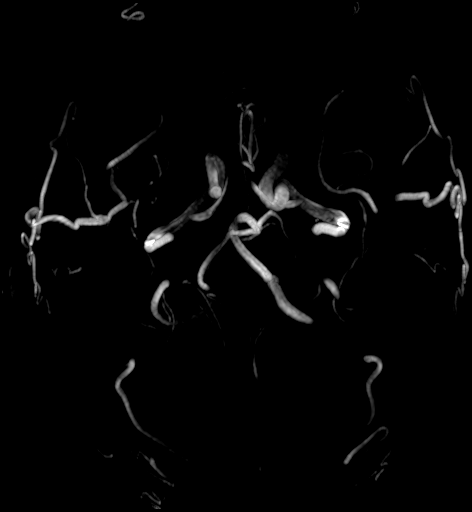

[22 of 48 positions shown; findings below may reference images not displayed]

FINDINGS: MRI HEAD FINDINGS

Brain: Acute infarct in the left middle frontal lobe extending to
the operculum. Multiple small areas of infarct are present in the
white matter and cortex in this area. No other acute infarct

Chronic infarct left cerebellum. Chronic infarct left parietal lobe.
Patchy white matter hyperintensity bilaterally compatible chronic
microvascular ischemia. Negative for hemorrhage, mass, or
hydrocephalus.

Vascular: Normal arterial flow voids.

Skull and upper cervical spine: Negative

Sinuses/Orbits: Negative

Other: None

MRA HEAD FINDINGS

Tortuosity of the intracranial circulation causes loss of signal
particularly in the middle cerebral arteries bilaterally. This is
symmetric and is felt to be artifact. Similar findings are present
in the proximal anterior cerebral arteries bilaterally.

No intracranial stenosis or large vessel occlusion.  No aneurysm.
IMPRESSION: Acute infarct left middle frontal lobe without hemorrhage.

Chronic infarct left cerebellum, left parietal lobe and moderate
chronic microvascular ischemic change in the white matter

Tortuous intracranial circulation causing artifact in the circle
Willis. Allowing for this, no intracranial stenosis or large vessel
occlusion is identified.

## 2020-04-20 IMAGING — CT CT HEAD W/O CM
2 of 6 series · 13 of 47 positions shown, 16 images · non-contrast
Comparison: [DATE]

CLINICAL DATA: Aphasia, left-sided facial droop starting yesterday.

EXAM:
CT HEAD WITHOUT CONTRAST
TECHNIQUE: Contiguous axial images were obtained from the base of the skull
through the vertex without intravenous contrast.

[Series 2: head wo · axial · 0.47mm/px · z∈[-103,+32]mm · 10 of 32 slices shown, 13 images]
[im 3/32  brain]
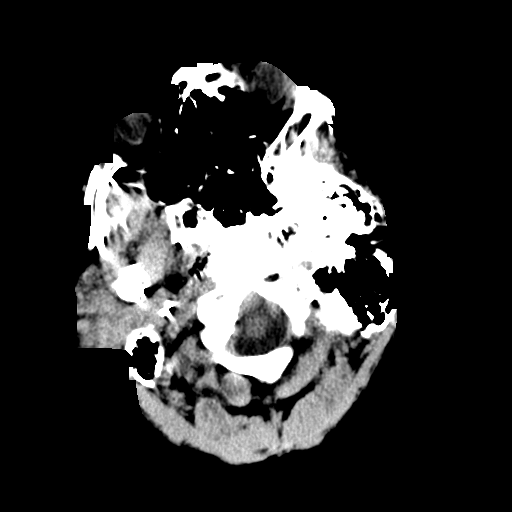
[im 3/32  bone]
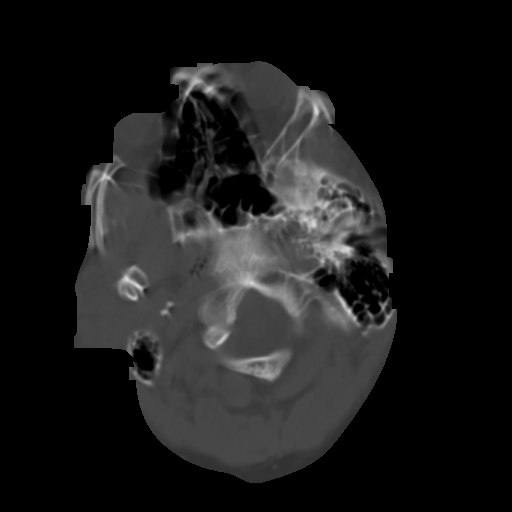
[im 6/32  brain]
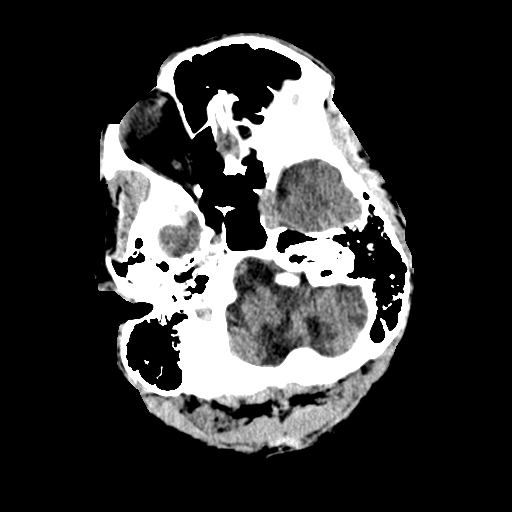
[im 9/32  brain]
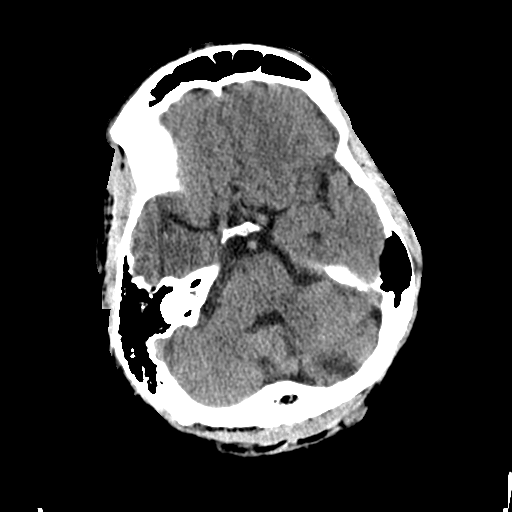
[im 12/32  brain]
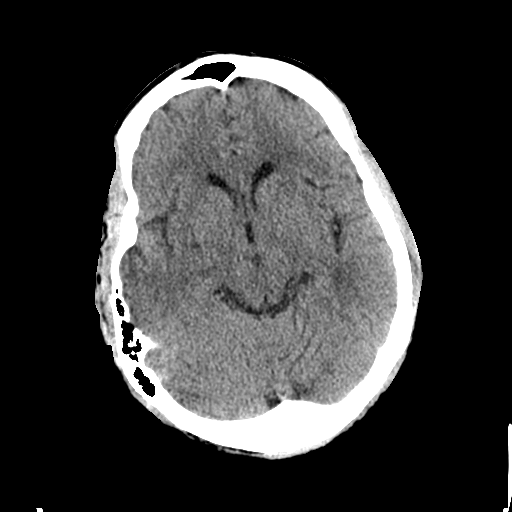
[im 15/32  brain]
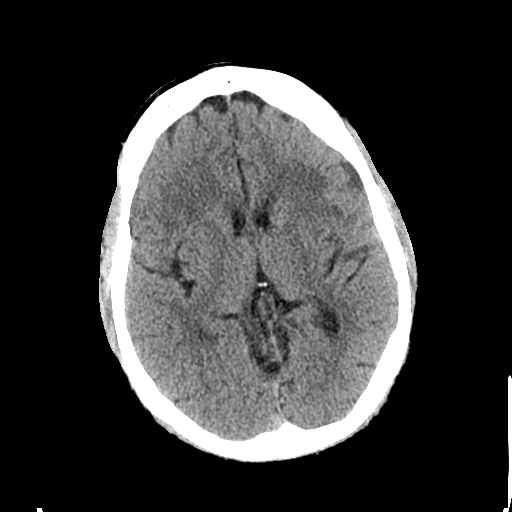
[im 15/32  bone]
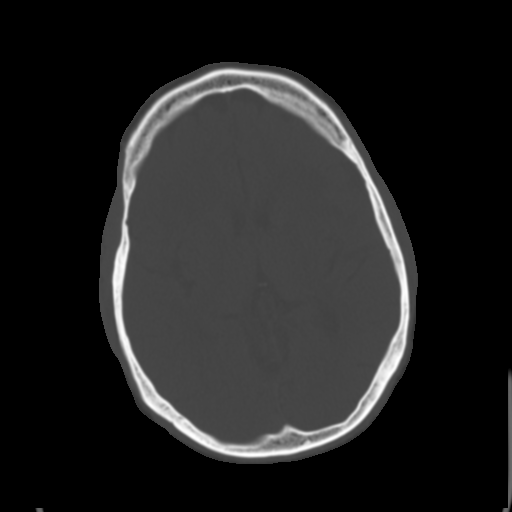
[im 18/32  brain]
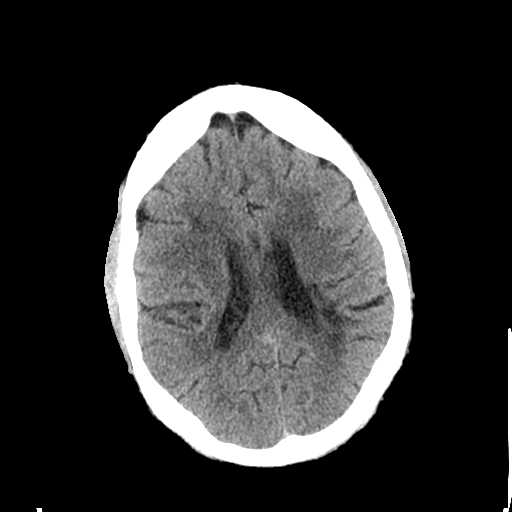
[im 21/32  brain]
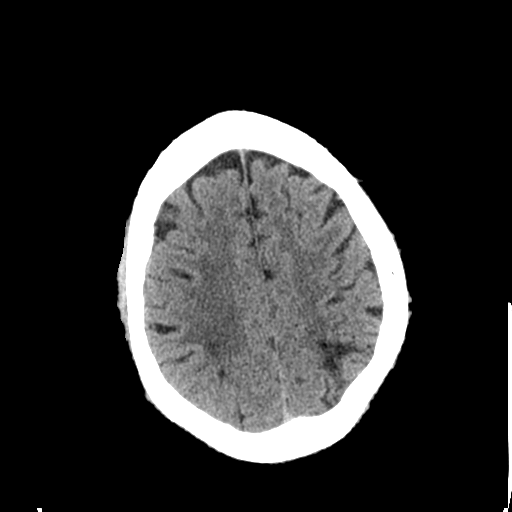
[im 24/32  brain]
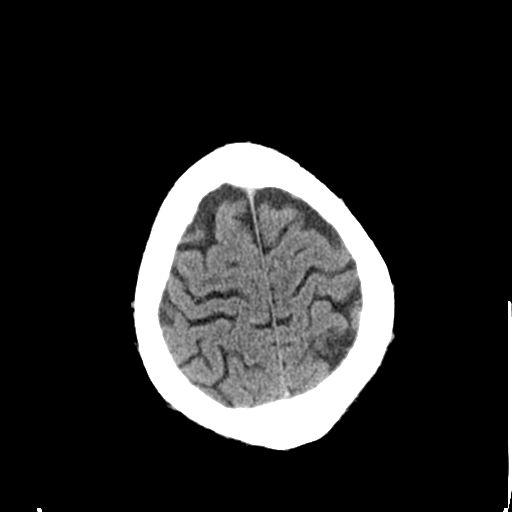
[im 27/32  brain]
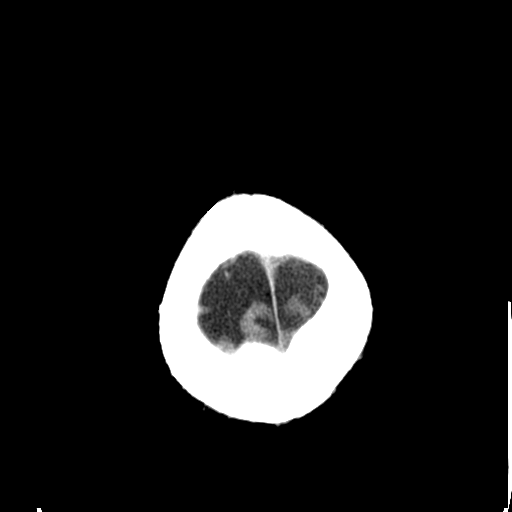
[im 27/32  bone]
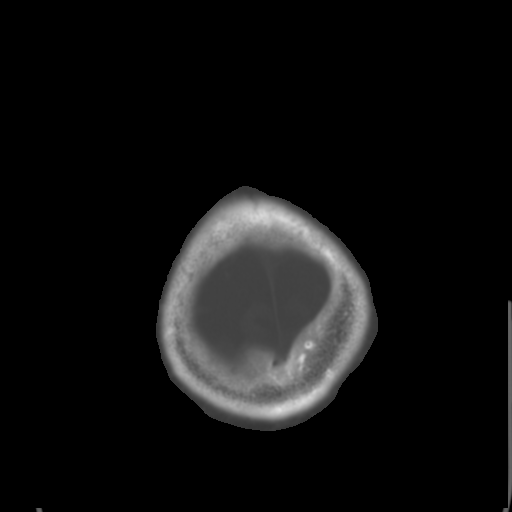
[im 30/32  brain]
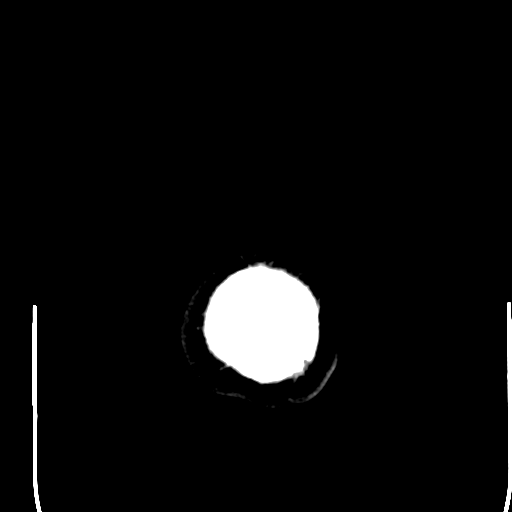

[Series 11: sagittal soft tissue · coronal · 0.18mm/px · 3 of 84 slices shown]
[im 21/84  brain]
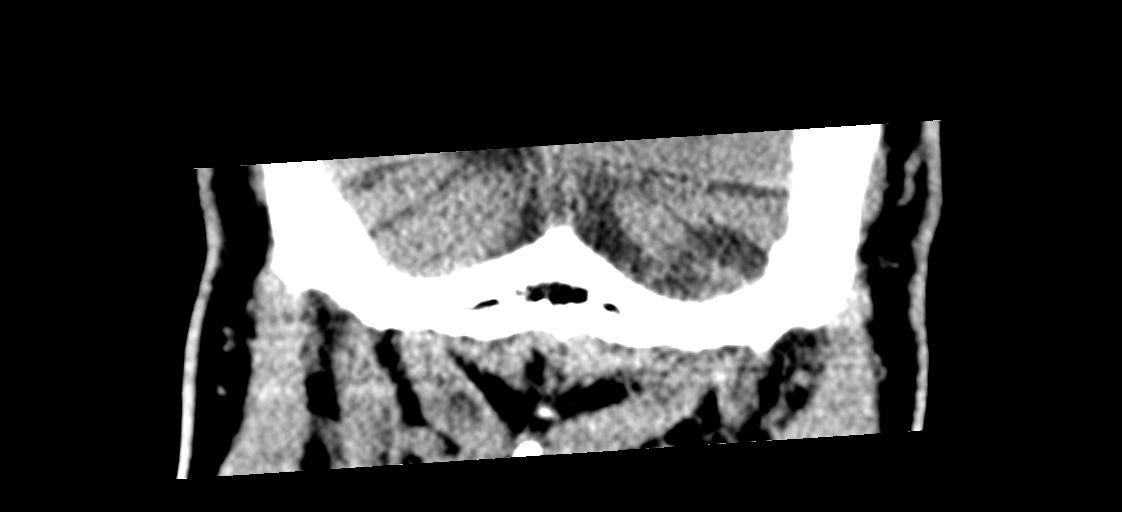
[im 42/84  brain]
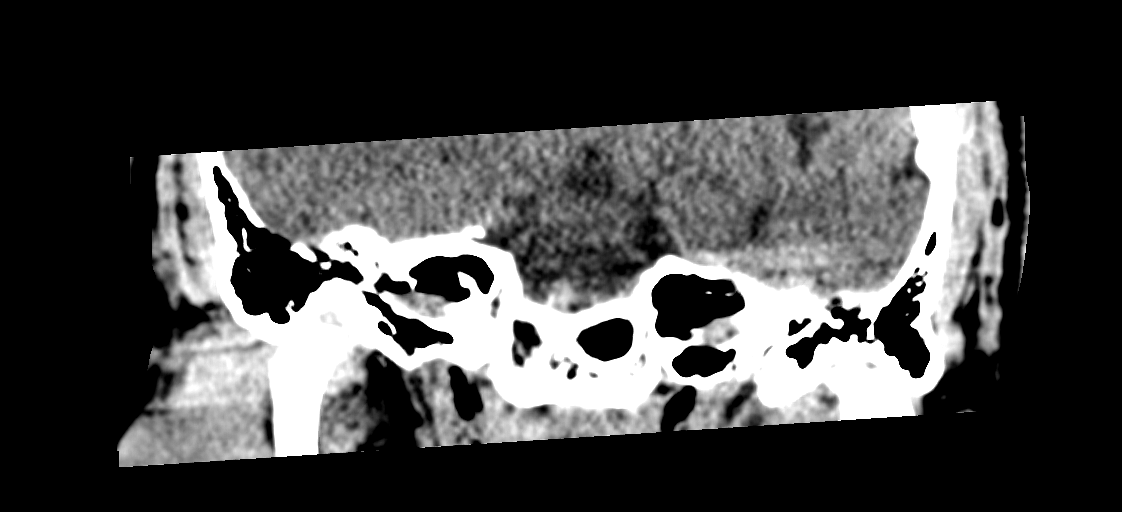
[im 63/84  brain]
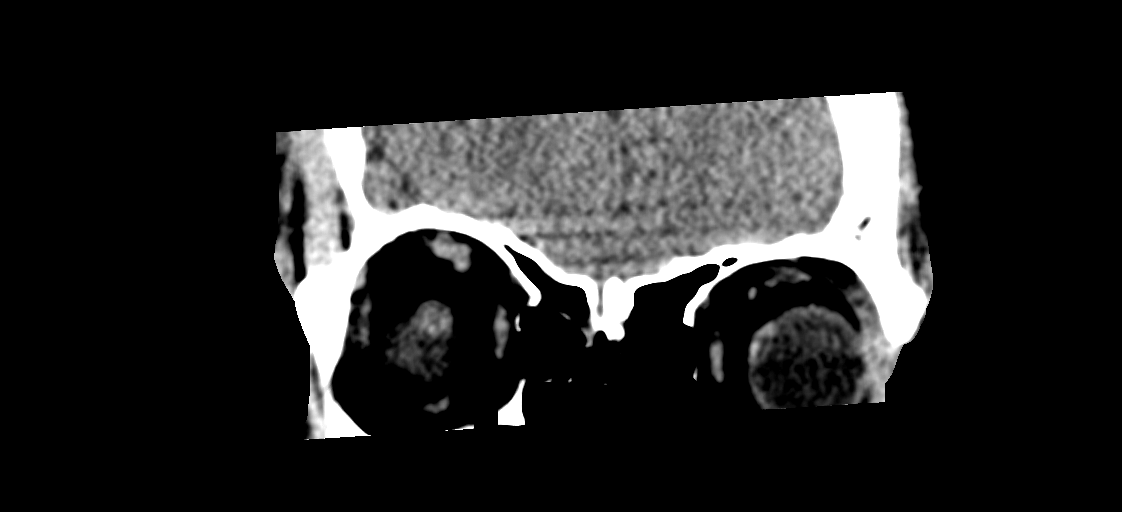

[13 of 47 positions shown; findings below may reference images not displayed]

FINDINGS: Brain: Remote infarcts of the left cerebellum and left parietal
lobe. Periventricular white matter and corona radiata hypodensities
favor chronic ischemic microvascular white matter disease.

Otherwise, the brainstem, cerebellum, cerebral peduncles, thalamus,
basal ganglia, basilar cisterns, and ventricular system appear
within normal limits. No intracranial hemorrhage, mass lesion, or
acute CVA.

Vascular: Unremarkable

Skull: Unremarkable

Sinuses/Orbits: Suspected mucosal swelling in the nasal cavity.
Otherwise unremarkable.

Other: No supplemental non-categorized findings.
IMPRESSION: 1. Remote infarcts of the left cerebellum and left parietal lobe.
Periventricular white matter and corona radiata hypodensities favor
chronic ischemic microvascular white matter disease.
2. No acute intracranial findings.

## 2020-04-20 IMAGING — MR MR HEAD W/O CM
10 series · 48 of 48 positions shown · non-contrast
Comparison: CT head [DATE]

CLINICAL DATA: Acute neuro deficit.  Aphasia

EXAM:
MRI HEAD WITHOUT CONTRAST
MRA HEAD WITHOUT CONTRAST
TECHNIQUE: Multiplanar, multiecho pulse sequences of the brain and surrounding
structures were obtained without intravenous contrast. Angiographic
images of the head were obtained using MRA technique without
contrast.

[Series 5: DWI · axial · 3.0mm · 1.36mm/px · z∈[-13,+128]mm · 9 of 96 slices shown (1 of 2)]
[im 1/96]
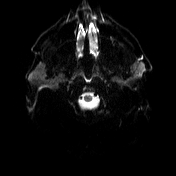
[im 12/96]
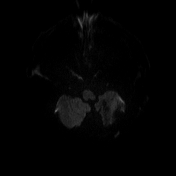
[im 24/96]
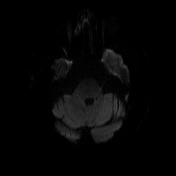
[im 36/96]
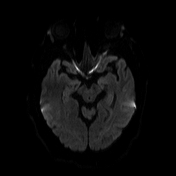
[im 48/96]
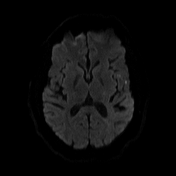
[im 60/96]
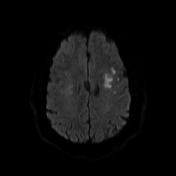
[im 72/96]
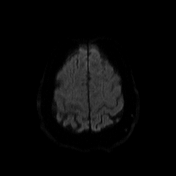
[im 84/96]
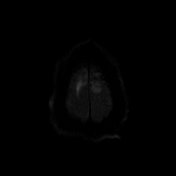
[im 96/96]
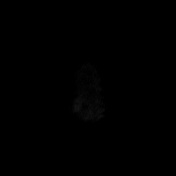

[Series 6: DWI · axial · 3.0mm · 1.36mm/px · z∈[-13,+128]mm · 4 of 48 slices shown (2 of 2)]
[im 1/48]
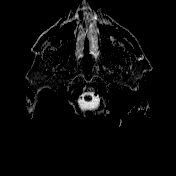
[im 16/48]
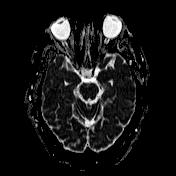
[im 32/48]
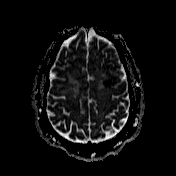
[im 48/48]
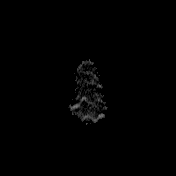

[Series 7: T1 · sagittal · 5.0mm · 0.75mm/px · 2 of 24 slices shown (1 of 2)]
[im 1/24]
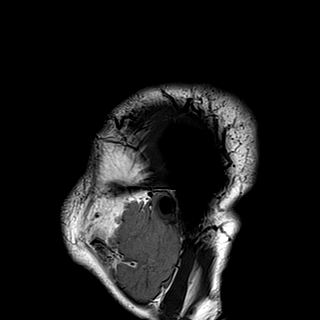
[im 24/24]
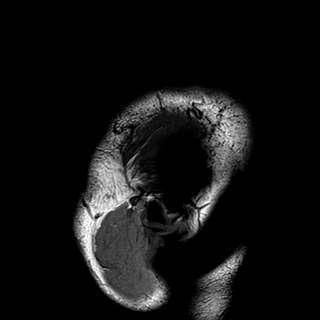

[Series 8: T2 · axial · 5.0mm · 0.62mm/px · z∈[-23,+139]mm · 2 of 26 slices shown (1 of 2)]
[im 1/26]
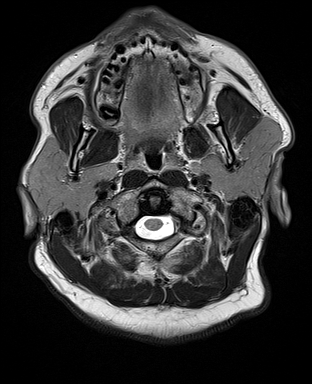
[im 26/26]
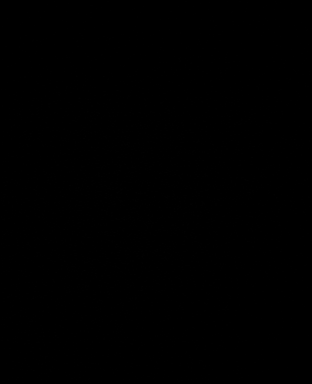

[Series 10: swi_images · axial · 3.0mm · 0.75mm/px · z∈[-48,+164]mm · 6 of 72 slices shown]
[im 1/72]
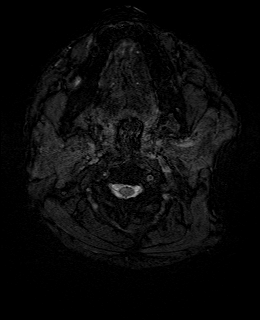
[im 15/72]
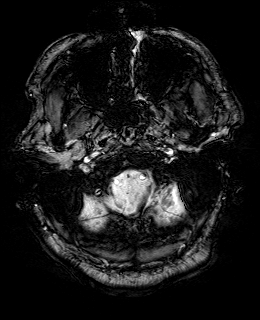
[im 29/72]
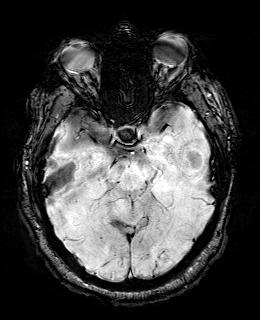
[im 43/72]
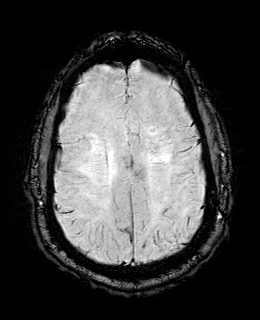
[im 57/72]
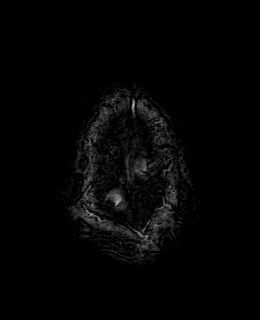
[im 72/72]
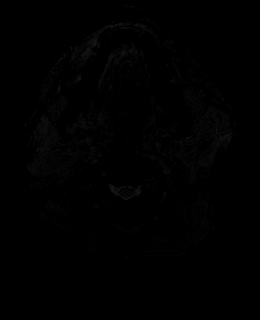

[Series 11: FLAIR · axial · 3.0mm · 0.75mm/px · z∈[-18,+134]mm · 4 of 52 slices shown]
[im 1/52]
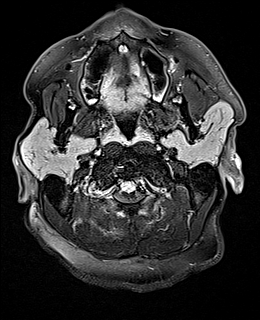
[im 18/52]
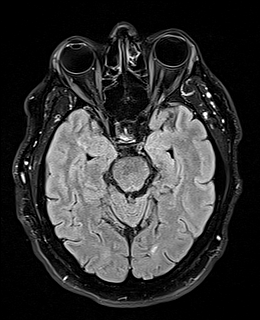
[im 35/52]
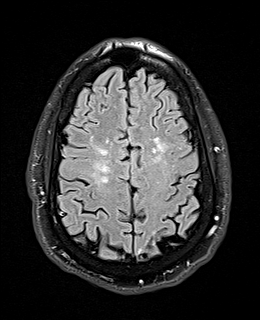
[im 52/52]
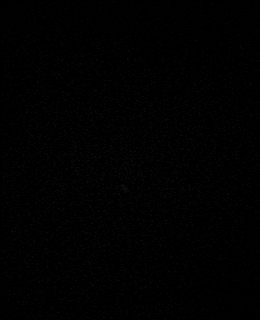

[Series 20: T1 · axial · 1.0mm · 0.94mm/px · z∈[-13,+129]mm · 12 of 144 slices shown (2 of 2)]
[im 1/144]
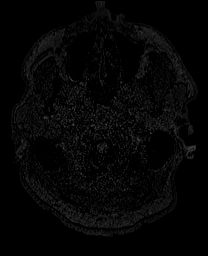
[im 14/144]
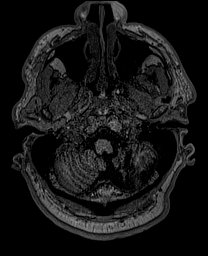
[im 27/144]
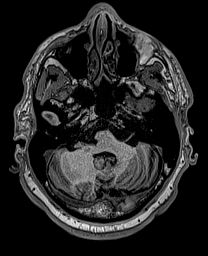
[im 40/144]
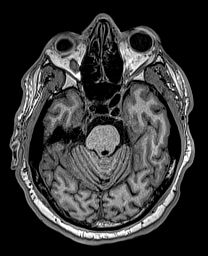
[im 53/144]
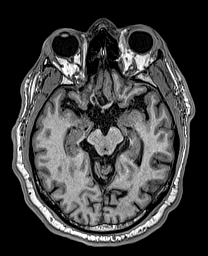
[im 66/144]
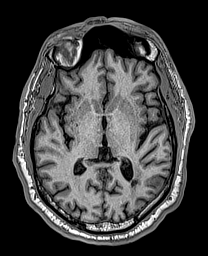
[im 79/144]
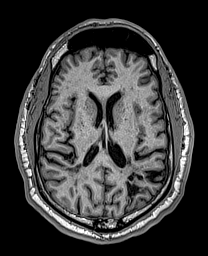
[im 92/144]
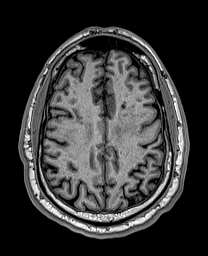
[im 105/144]
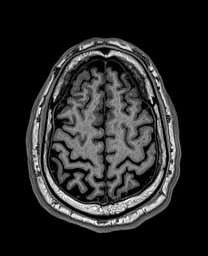
[im 118/144]
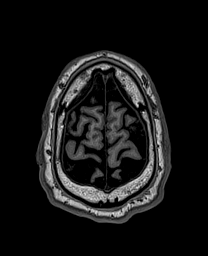
[im 131/144]
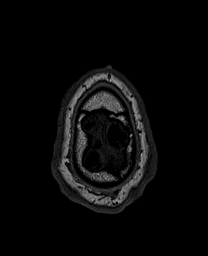
[im 144/144]
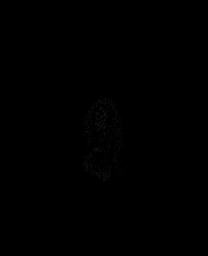

[Series 21: cor dwi_tracew · coronal · 5.0mm · 1.53mm/px · 5 of 54 slices shown]
[im 1/54]
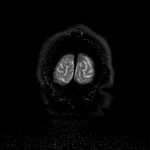
[im 14/54]
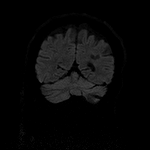
[im 27/54]
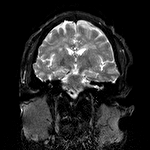
[im 40/54]
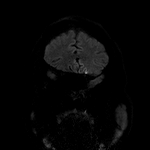
[im 54/54]
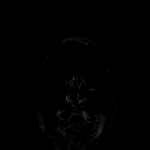

[Series 22: cor dwi_adc · coronal · 5.0mm · 1.53mm/px · 2 of 27 slices shown]
[im 1/27]
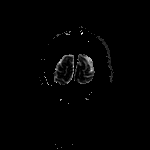
[im 27/27]
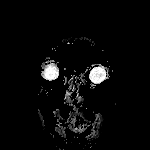

[Series 23: T2 · coronal · 5.0mm · 0.57mm/px · 2 of 27 slices shown (2 of 2)]
[im 1/27]
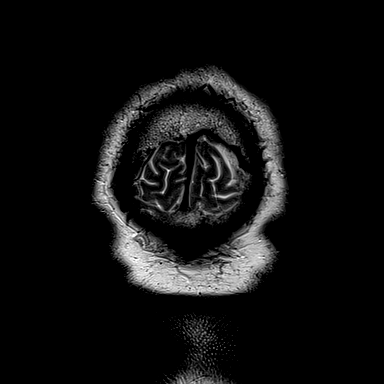
[im 27/27]
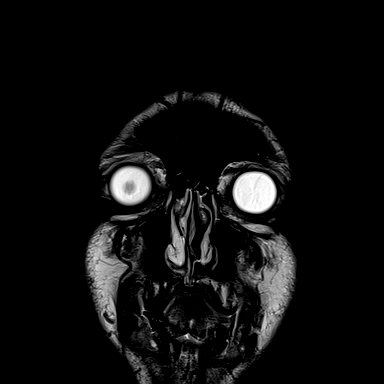

[48 of 48 positions shown; findings below may reference images not displayed]

FINDINGS: MRI HEAD FINDINGS

Brain: Acute infarct in the left middle frontal lobe extending to
the operculum. Multiple small areas of infarct are present in the
white matter and cortex in this area. No other acute infarct

Chronic infarct left cerebellum. Chronic infarct left parietal lobe.
Patchy white matter hyperintensity bilaterally compatible chronic
microvascular ischemia. Negative for hemorrhage, mass, or
hydrocephalus.

Vascular: Normal arterial flow voids.

Skull and upper cervical spine: Negative

Sinuses/Orbits: Negative

Other: None

MRA HEAD FINDINGS

Tortuosity of the intracranial circulation causes loss of signal
particularly in the middle cerebral arteries bilaterally. This is
symmetric and is felt to be artifact. Similar findings are present
in the proximal anterior cerebral arteries bilaterally.

No intracranial stenosis or large vessel occlusion.  No aneurysm.
IMPRESSION: Acute infarct left middle frontal lobe without hemorrhage.

Chronic infarct left cerebellum, left parietal lobe and moderate
chronic microvascular ischemic change in the white matter

Tortuous intracranial circulation causing artifact in the circle
Willis. Allowing for this, no intracranial stenosis or large vessel
occlusion is identified.

## 2020-04-20 MED ORDER — ASPIRIN 325 MG PO TABS
325.0000 mg | ORAL_TABLET | Freq: Every day | ORAL | Status: DC
  Administered 2020-04-20: 325 mg via ORAL
  Filled 2020-04-20: qty 1

## 2020-04-20 MED ORDER — ONDANSETRON HCL 4 MG/2ML IJ SOLN: 4.0000 mg | Freq: Four times a day (QID) | INTRAMUSCULAR | Status: DC | PRN

## 2020-04-20 MED ORDER — INSULIN ASPART 100 UNIT/ML ~~LOC~~ SOLN
0.0000 [IU] | Freq: Every day | SUBCUTANEOUS | Status: DC
  Administered 2020-04-22: 2 [IU] via SUBCUTANEOUS

## 2020-04-20 MED ORDER — LABETALOL HCL 5 MG/ML IV SOLN: 10.0000 mg | INTRAVENOUS | Status: DC | PRN

## 2020-04-20 MED ORDER — SENNOSIDES-DOCUSATE SODIUM 8.6-50 MG PO TABS: 1.0000 | ORAL_TABLET | Freq: Every evening | ORAL | Status: DC | PRN

## 2020-04-20 MED ORDER — ASPIRIN 300 MG RE SUPP: 300.0000 mg | Freq: Every day | RECTAL | Status: DC

## 2020-04-20 MED ORDER — STROKE: EARLY STAGES OF RECOVERY BOOK
Freq: Once | Status: AC
  Filled 2020-04-20: qty 1

## 2020-04-20 MED ORDER — LORAZEPAM 2 MG/ML IJ SOLN: 0.5000 mg | INTRAMUSCULAR | Status: DC | PRN

## 2020-04-20 MED ORDER — ENOXAPARIN SODIUM 40 MG/0.4ML ~~LOC~~ SOLN
40.0000 mg | SUBCUTANEOUS | Status: DC
  Administered 2020-04-20 – 2020-04-22 (×3): 40 mg via SUBCUTANEOUS
  Filled 2020-04-20 (×3): qty 0.4

## 2020-04-20 MED ORDER — POTASSIUM CHLORIDE IN NACL 40-0.9 MEQ/L-% IV SOLN
INTRAVENOUS | Status: AC
  Filled 2020-04-20 (×2): qty 1000

## 2020-04-20 MED ORDER — SODIUM CHLORIDE 0.9 % IV SOLN
100.0000 mL/h | INTRAVENOUS | Status: DC
  Administered 2020-04-20: 100 mL/h via INTRAVENOUS

## 2020-04-20 MED ORDER — ACETAMINOPHEN 160 MG/5ML PO SOLN: 650.0000 mg | ORAL | Status: DC | PRN

## 2020-04-20 MED ORDER — MAGNESIUM SULFATE IN D5W 1-5 GM/100ML-% IV SOLN
1.0000 g | Freq: Once | INTRAVENOUS | Status: AC
  Administered 2020-04-21: 1 g via INTRAVENOUS
  Filled 2020-04-20: qty 100

## 2020-04-20 MED ORDER — ACETAMINOPHEN 325 MG PO TABS
650.0000 mg | ORAL_TABLET | ORAL | Status: DC | PRN
  Administered 2020-04-20: 650 mg via ORAL
  Filled 2020-04-20: qty 2

## 2020-04-20 MED ORDER — INSULIN ASPART 100 UNIT/ML ~~LOC~~ SOLN
0.0000 [IU] | Freq: Three times a day (TID) | SUBCUTANEOUS | Status: DC
  Administered 2020-04-21: 2 [IU] via SUBCUTANEOUS
  Administered 2020-04-21: 3 [IU] via SUBCUTANEOUS
  Administered 2020-04-21 – 2020-04-22 (×2): 5 [IU] via SUBCUTANEOUS
  Administered 2020-04-22: 3 [IU] via SUBCUTANEOUS
  Administered 2020-04-22 – 2020-04-23 (×2): 5 [IU] via SUBCUTANEOUS
  Administered 2020-04-23: 2 [IU] via SUBCUTANEOUS

## 2020-04-20 MED ORDER — NICOTINE 21 MG/24HR TD PT24
21.0000 mg | MEDICATED_PATCH | Freq: Every day | TRANSDERMAL | Status: DC
  Administered 2020-04-20 – 2020-04-23 (×4): 21 mg via TRANSDERMAL
  Filled 2020-04-20 (×4): qty 1

## 2020-04-20 MED ORDER — POTASSIUM CHLORIDE 20 MEQ PO PACK
60.0000 meq | PACK | Freq: Once | ORAL | Status: AC
  Administered 2020-04-21: 60 meq via ORAL
  Filled 2020-04-20: qty 3

## 2020-04-20 MED ORDER — SODIUM CHLORIDE 0.9 % IV BOLUS
500.0000 mL | Freq: Once | INTRAVENOUS | Status: AC
  Administered 2020-04-20: 500 mL via INTRAVENOUS

## 2020-04-20 MED ORDER — ACETAMINOPHEN 650 MG RE SUPP: 650.0000 mg | RECTAL | Status: DC | PRN

## 2020-04-20 MED ORDER — HYDRALAZINE HCL 20 MG/ML IJ SOLN: 10.0000 mg | INTRAMUSCULAR | Status: DC | PRN

## 2020-04-20 MED ORDER — LORAZEPAM 1 MG PO TABS: 1.0000 mg | ORAL_TABLET | ORAL | Status: DC | PRN

## 2020-04-20 NOTE — ED Notes (Signed)
Report given to [facility]

## 2020-04-20 NOTE — ED Notes (Signed)
Assumed care of patient.

## 2020-04-20 NOTE — ED Provider Notes (Signed)
[facility]-EMERGENCY DEPT Provider Note   CSN: [id number] Arrival date & time: 04/20/20  1234     History Chief Complaint  Patient presents with  . Aphasia    Aphasia, L sided facial droop since yesterday, as per girlfriend pts last known well 9am yesterday.     Nathaniel Spears is a 45 y.o. male.  HPI   Patient presented to the emergency room for evaluation of inability to speak.  History is primarily primarily by the patient's girlfriend.  Patient is having difficulty speaking to me.  Patient was last known normal about 9 AM yesterday.  Ever since then he has had gradually worsening symptoms throughout the day until today.  He is having difficulty speaking at all.  She also notes somewhat of a left-sided facial droop.  No known trouble with weakness in his arms or legs.  Patient is able to look at me and follow commands but he is not able to speak and answer any questions  History reviewed. No pertinent past medical history.  Patient Active Problem List   Diagnosis Date Noted  . Essential hypertension 08/21/2019  . Obesity 08/21/2019    History reviewed. No pertinent surgical history.     History reviewed. No pertinent family history.  Social History   Tobacco Use  . Smoking status: Current Every Day Smoker  . Smokeless tobacco: Never Used  Substance Use Topics  . Alcohol use: Not Currently  . Drug use: Never    Home Medications Prior to Admission medications   Medication Sig Start Date End Date Taking? Authorizing Provider  amLODipine (NORVASC) 10 MG tablet TAKE 1 TABLET BY MOUTH DAILY. 08/21/19 08/20/20  Grayce Sessions, NP  losartan-hydrochlorothiazide (HYZAAR) 100-25 MG tablet Take 1 tablet by mouth daily. 01/27/20   [provider]  losartan-hydrochlorothiazide (HYZAAR) 100-25 MG tablet TAKE 1 TABLET BY MOUTH DAILY. 08/21/19 08/20/20  Grayce Sessions, NP    Allergies    Patient has no known allergies.  Review of Systems    Review of Systems  All other systems reviewed and are negative.   Physical Exam Updated Vital Signs BP (!) 159/101   Pulse 87   Temp 98.8 F (37.1 C) (Oral)   Resp (!) 23   Ht 1.854 m (6\' 1" )   Wt 113.4 kg   SpO2 99%   BMI 32.98 kg/m   Physical Exam Vitals and nursing note reviewed.  Constitutional:      General: He is not in acute distress.    Appearance: He is well-developed.  HENT:     Head: Normocephalic and atraumatic.     Right Ear: External ear normal.     Left Ear: External ear normal.  Eyes:     General: No scleral icterus.       Right eye: No discharge.        Left eye: No discharge.     Conjunctiva/sclera: Conjunctivae normal.  Neck:     Trachea: No tracheal deviation.  Cardiovascular:     Rate and Rhythm: Normal rate and regular rhythm.  Pulmonary:     Effort: Pulmonary effort is normal. No respiratory distress.     Breath sounds: Normal breath sounds. No stridor. No wheezing or rales.  Abdominal:     General: Bowel sounds are normal. There is no distension.     Palpations: Abdomen is soft.     Tenderness: There is no abdominal tenderness. There is no guarding or rebound.  Musculoskeletal:  General: No tenderness.     Cervical back: Neck supple.  Skin:    General: Skin is warm and dry.     Findings: No rash.  Neurological:     Mental Status: He is alert and oriented to person, place, and time.     Cranial Nerves: No cranial nerve deficit ( tongue midline  , aphasia).     Sensory: No sensory deficit.     Motor: No abnormal muscle tone or seizure activity.     Coordination: Coordination normal.     Comments: No pronator drift bilateral upper extrem, able to hold both legs off bed for 5 seconds, sensation intact in all extremities,  no left or right sided neglect,  ?left facial droop, , no nystagmus noted      ED Results / Procedures / Treatments   Labs (all labs ordered are listed, but only abnormal results are displayed) Labs Reviewed   COMPREHENSIVE METABOLIC PANEL - Abnormal; Notable for the following components:      Result Value   Potassium 3.3 (*)    Glucose, Bld 267 (*)    Calcium 8.8 (*)    All other components within normal limits  RESP PANEL BY RT-PCR (FLU A&B, COVID) ARPGX2  ETHANOL  PROTIME-INR  APTT  CBC  DIFFERENTIAL  RAPID URINE DRUG SCREEN, HOSP PERFORMED  URINALYSIS, ROUTINE W REFLEX MICROSCOPIC  AMMONIA    EKG EKG Interpretation  Date/Time:  Tuesday April 20 2020 12:57:35 EDT Ventricular Rate:  89 PR Interval:  156 QRS Duration: 97 QT Interval:  403 QTC Calculation: 491 R Axis:   11 Text Interpretation: Sinus rhythm Probable left atrial enlargement Nonspecific T abnormalities, lateral leads ST elev, probable normal early repol pattern Borderline prolonged QT interval No significant change since last tracing Confirmed by Linwood Dibbles [phone]) on 04/20/2020 1:34:42 PM   Radiology CT HEAD WO CONTRAST  Result Date: 04/20/2020 CLINICAL DATA:  Aphasia, left-sided facial droop starting yesterday. EXAM: CT HEAD WITHOUT CONTRAST TECHNIQUE: Contiguous axial images were obtained from the base of the skull through the vertex without intravenous contrast. COMPARISON:  02/10/2020 FINDINGS: Brain: Remote infarcts of the left cerebellum and left parietal lobe. Periventricular white matter and corona radiata hypodensities favor chronic ischemic microvascular white matter disease. Otherwise, the brainstem, cerebellum, cerebral peduncles, thalamus, basal ganglia, basilar cisterns, and ventricular system appear within normal limits. No intracranial hemorrhage, mass lesion, or acute CVA. Vascular: Unremarkable Skull: Unremarkable Sinuses/Orbits: Suspected mucosal swelling in the nasal cavity. Otherwise unremarkable. Other: No supplemental non-categorized findings. IMPRESSION: 1. Remote infarcts of the left cerebellum and left parietal lobe. Periventricular white matter and corona radiata hypodensities favor chronic ischemic  microvascular white matter disease. 2. No acute intracranial findings. Electronically Signed   By: Gaylyn Rong M.D.   On: 04/20/2020 14:48    Procedures Procedures   Medications Ordered in ED Medications  sodium chloride 0.9 % bolus 500 mL (0 mLs Intravenous Stopped 04/20/20 1444)    Followed by  0.9 %  sodium chloride infusion (100 mL/hr Intravenous New Bag/Given 04/20/20 1333)  LORazepam (ATIVAN) tablet 1 mg (has no administration in time range)    ED Course  I have reviewed the triage vital signs and the nursing notes.  Pertinent labs & imaging results that were available during my care of the patient were reviewed by me and considered in my medical decision making (see chart for details).  Clinical Course as of 04/20/20 1528  Tue Apr 20, 2020  1425 Initial labs are unremarkable.  CBC and metabolic panel are normal. [JK]  1425 Covid panel is negative.  Hyperglycemia is noted but doubt clinically significant [JK]  1511 CT scan shows remote infarcts but no acute findings [JK]    Clinical Course User Index [JK] Linwood Dibbles, MD   MDM Rules/Calculators/A&P                          Patient presented to the ED for evaluation of aphasia.  On exam has findings concerning for possible stroke.  He does have some left-sided subtle facial droop and clear aphasia.  [name] CT scan does not show any acute stroke but does show evidence of prior strokes.  His laboratory tests are otherwise unremarkable.  Patient is outside of acute TPA window.  He is also outside of any vascular intervention window.  I have ordered an MRI.  Care will be turned over to oncoming team. Final Clinical Impression(s) / ED Diagnoses Final diagnoses:  Aphasia      Linwood Dibbles, MD 04/20/20 1528

## 2020-04-20 NOTE — ED Notes (Signed)
[facility] transport is here.

## 2020-04-20 NOTE — ED Triage Notes (Addendum)
This Clinical research associate did a quick assessment.  Facial symmetry noted (smile was more of a cringe).  No weakness noted. Last seen normal was last night.  Pt crying during assessment. Denies pain.

## 2020-04-20 NOTE — ED Notes (Signed)
Report given to [facility] RN.  

## 2020-04-20 NOTE — Progress Notes (Signed)
Pt Nathaniel Spears girlfriend of pt contact  tel # [phone]

## 2020-04-20 NOTE — ED Notes (Signed)
Notified MD Jeraldine Loots of pts symptoms and LKW time.

## 2020-04-20 NOTE — ED Provider Notes (Signed)
Patient care assumed at 1530. Patient with history of CVA here for evaluation of dense aphasia. Last known well at 9 AM yesterday. MRI is pending.  MRI is significant for acute CVA. Discussed with patient findings of studies. Discussed with Dr. Wilford Corner with neurology. He recommends permissive hypertension with transfer to [facility] and admission to the hospitalist service. Hospitalist consulted for admission.   Tilden Fossa, MD 04/21/20 0003

## 2020-04-20 NOTE — ED Notes (Signed)
Patient cannot verbalize some words, but can answer yes and no questions appropriately. Patient swallowed small sips of water without difficulty.

## 2020-04-20 NOTE — ED Notes (Signed)
Patient transported with [facility] 

## 2020-04-20 NOTE — H&P (Signed)
History and Physical    Nathaniel Spears PHX:[id number] DOB: 1975/09/07 DOA: 04/20/2020  PCP: Grayce Sessions, NP  Patient coming from: Home  I have personally briefly reviewed patient's old medical records in [facility]  Chief Complaint: Aphasia  HPI: Nathaniel Spears is a 45 y.o. male with medical history significant for hypertension and tobacco use who presents to the ED for evaluation of aphasia.  History is limited from patient due to expressive aphasia and is otherwise obtained from EDP and chart review.  Patient was reportedly last known normal 9 AM yesterday.  Patient indicates that yesterday around 5-6 PM is when his symptoms of aphasia began.  He is now having difficulty speaking at all.  He does appear to understand when spoken to and nods his head yes/no on questioning.  He denied any associated headache, change in vision, nausea, vomiting, new weakness in his extremities, numbness/tingling, chest pain, palpitations, or dyspnea.  He is a current smoker and indicates that he smokes 1 pack/day, he cannot express how long he has been smoking.  ED Course:  Initial vitals showed BP 167/105, pulse 88, RR 23, temp 98.8 F, SPO2 100% on room air.  Labs show sodium 139, potassium 3.3, bicarb 28, BUN 11, creatinine 1.10, serum glucose 267, LFTs within normal limits, WBC 7.9, hemoglobin 15.2, platelets 206,000, serum ethanol <10.  SARS-CoV-2 PCR negative.  Influenza A/B PCR negative.  CT head without contrast shows remote infarcts of the left cerebellum and left parietal lobe.  No acute intracranial findings.  MRI brain and MRA head without contrast show acute infarct left middle frontal lobe without hemorrhage.  Chronic infarct left cerebellum, left parietal lobe and moderate chronic microvascular ischemic changes in the white matter noted.  Tortuous intracranial circulation causing artifact in the circle of Willis is reported without evidence of intracranial stenosis or large vessel  occlusion.  Patient was given 500 cc normal saline.  EDP discussed with on-call neurology who recommended allowing permissive hypertension and medical admission to [facility].  The hospitalist service was consulted to admit for further evaluation and management.  Review of Systems:  All systems reviewed and are negative except as documented in history of present illness above.   Past Medical History:  Diagnosis Date  . Hypertension     History reviewed. No pertinent surgical history.  Social History:  reports that he has been smoking. He has never used smokeless tobacco. He reports previous alcohol use. He reports that he does not use drugs.  No Known Allergies  History reviewed. No pertinent family history.   Prior to Admission medications   Medication Sig Start Date End Date Taking? Authorizing Provider  amLODipine (NORVASC) 10 MG tablet TAKE 1 TABLET BY MOUTH DAILY. 08/21/19 08/20/20 Yes Grayce Sessions, NP  losartan-hydrochlorothiazide (HYZAAR) 100-25 MG tablet Take 1 tablet by mouth daily. 01/27/20  Yes [provider]  losartan-hydrochlorothiazide (HYZAAR) 100-25 MG tablet TAKE 1 TABLET BY MOUTH DAILY. Patient not taking: No sig reported 08/21/19 08/20/20  Grayce Sessions, NP    Physical Exam: Vitals:   04/20/20 1530 04/20/20 1600 04/20/20 1733 04/20/20 1830  BP: (!) 167/106 (!) 174/111 (!) 181/101 (!) 175/108  Pulse: 88 89 95 92  Resp: (!) 26 18 (!) 27 (!) 24  Temp:      TempSrc:      SpO2: 96% 100% 100% 98%  Weight:      Height:       Constitutional: Resting supine in bed, NAD, calm,  comfortable Eyes: Right pupil slightly smaller when compared to left otherwise reactive to light, EOMI, lids and conjunctivae normal ENMT: Mucous membranes are dry, halitosis. Posterior pharynx clear of any exudate or lesions.Normal dentition.  Neck: normal, supple, no masses. Respiratory: clear to auscultation bilaterally, no wheezing, no crackles. Normal  respiratory effort. No accessory muscle use.  Cardiovascular: Regular rate and rhythm, no murmurs / rubs / gallops. No extremity edema. 2+ pedal pulses. Abdomen: no tenderness, no masses palpated. No hepatosplenomegaly. Bowel sounds positive.  Musculoskeletal: no clubbing / cyanosis. No joint deformity upper and lower extremities. Good ROM, no contractures. Normal muscle tone.  Skin: no rashes, lesions, ulcers. No induration Neurologic: Dense expressive aphasia otherwise CN 2-12 grossly intact. Sensation intact. Strength 5/5 in all 4.  Psychiatric: Awake, alert, and appears to understand when spoken to but cannot communicate verbally.  Labs on Admission: I have personally reviewed following labs and imaging studies  CBC: Recent Labs  Lab 04/20/20 1321  WBC 7.9  NEUTROABS 4.8  HGB 15.2  HCT 46.2  MCV 81.8  PLT 206   Basic Metabolic Panel: Recent Labs  Lab 04/20/20 1321  NA 139  K 3.3*  CL 99  CO2 28  GLUCOSE 267*  BUN 11  CREATININE 1.10  CALCIUM 8.8*   GFR: Estimated Creatinine Clearance: 111.9 mL/min (by C-G formula based on SCr of 1.1 mg/dL). Liver Function Tests: Recent Labs  Lab 04/20/20 1321  AST 18  ALT 18  ALKPHOS 86  BILITOT 1.2  PROT 7.7  ALBUMIN 4.1   No results for input(s): LIPASE, AMYLASE in the last 168 hours. No results for input(s): AMMONIA in the last 168 hours. Coagulation Profile: Recent Labs  Lab 04/20/20 1321  INR 1.1   Cardiac Enzymes: No results for input(s): CKTOTAL, CKMB, CKMBINDEX, TROPONINI in the last 168 hours. BNP (last 3 results) No results for input(s): PROBNP in the last 8760 hours. HbA1C: No results for input(s): HGBA1C in the last 72 hours. CBG: No results for input(s): GLUCAP in the last 168 hours. Lipid Profile: No results for input(s): CHOL, HDL, LDLCALC, TRIG, CHOLHDL, LDLDIRECT in the last 72 hours. Thyroid Function Tests: No results for input(s): TSH, T4TOTAL, FREET4, T3FREE, THYROIDAB in the last 72  hours. Anemia Panel: No results for input(s): VITAMINB12, FOLATE, FERRITIN, TIBC, IRON, RETICCTPCT in the last 72 hours. Urine analysis: No results found for: COLORURINE, APPEARANCEUR, LABSPEC, PHURINE, GLUCOSEU, HGBUR, BILIRUBINUR, KETONESUR, PROTEINUR, UROBILINOGEN, NITRITE, LEUKOCYTESUR  Radiological Exams on Admission: CT HEAD WO CONTRAST  Result Date: 04/20/2020 CLINICAL DATA:  Aphasia, left-sided facial droop starting yesterday. EXAM: CT HEAD WITHOUT CONTRAST TECHNIQUE: Contiguous axial images were obtained from the base of the skull through the vertex without intravenous contrast. COMPARISON:  02/10/2020 FINDINGS: Brain: Remote infarcts of the left cerebellum and left parietal lobe. Periventricular white matter and corona radiata hypodensities favor chronic ischemic microvascular white matter disease. Otherwise, the brainstem, cerebellum, cerebral peduncles, thalamus, basal ganglia, basilar cisterns, and ventricular system appear within normal limits. No intracranial hemorrhage, mass lesion, or acute CVA. Vascular: Unremarkable Skull: Unremarkable Sinuses/Orbits: Suspected mucosal swelling in the nasal cavity. Otherwise unremarkable. Other: No supplemental non-categorized findings. IMPRESSION: 1. Remote infarcts of the left cerebellum and left parietal lobe. Periventricular white matter and corona radiata hypodensities favor chronic ischemic microvascular white matter disease. 2. No acute intracranial findings. Electronically Signed   By: Gaylyn Rong M.D.   On: 04/20/2020 14:48   MR ANGIO HEAD WO CONTRAST  Result Date: 04/20/2020 CLINICAL DATA:  Acute neuro  deficit.  Aphasia EXAM: MRI HEAD WITHOUT CONTRAST MRA HEAD WITHOUT CONTRAST TECHNIQUE: Multiplanar, multiecho pulse sequences of the brain and surrounding structures were obtained without intravenous contrast. Angiographic images of the head were obtained using MRA technique without contrast. COMPARISON:  CT head 04/20/2020 FINDINGS: MRI  HEAD FINDINGS Brain: Acute infarct in the left middle frontal lobe extending to the operculum. Multiple small areas of infarct are present in the white matter and cortex in this area. No other acute infarct 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 Patchy white matter hyperintensity bilaterally compatible chronic microvascular ischemia. Negative for hemorrhage, mass, or hydrocephalus. Vascular: Normal arterial flow voids. Skull and upper cervical spine: Negative Sinuses/Orbits: Negative Other: None MRA HEAD FINDINGS Tortuosity of the intracranial circulation causes loss of signal particularly in the middle cerebral arteries bilaterally. This is symmetric and is felt to be artifact. Similar findings are present in the proximal anterior cerebral arteries bilaterally. No intracranial stenosis or large vessel occlusion.  No aneurysm. IMPRESSION: Acute infarct left middle frontal lobe without hemorrhage. Chronic infarct left cerebellum, left parietal lobe and moderate chronic microvascular ischemic change in the white matter Tortuous intracranial circulation causing artifact in the circle Willis. Allowing for this, no intracranial stenosis or large vessel occlusion is identified. Electronically Signed   By: Marlan Palau M.D.   On: 04/20/2020 17:37   MR BRAIN WO CONTRAST  Result Date: 04/20/2020 CLINICAL DATA:  Acute neuro deficit.  Aphasia EXAM: MRI HEAD WITHOUT CONTRAST MRA HEAD WITHOUT CONTRAST TECHNIQUE: Multiplanar, multiecho pulse sequences of the brain and surrounding structures were obtained without intravenous contrast. Angiographic images of the head were obtained using MRA technique without contrast. COMPARISON:  CT head 04/20/2020 FINDINGS: MRI HEAD FINDINGS Brain: Acute infarct in the left middle frontal lobe extending to the operculum. Multiple small areas of infarct are present in the white matter and cortex in this area. No other acute infarct Chronic infarct left  cerebellum. Chronic infarct left parietal lobe. Patchy white matter hyperintensity bilaterally compatible chronic microvascular ischemia. Negative for hemorrhage, mass, or hydrocephalus. Vascular: Normal arterial flow voids. Skull and upper cervical spine: Negative Sinuses/Orbits: Negative Other: None MRA HEAD FINDINGS Tortuosity of the intracranial circulation causes loss of signal particularly in the middle cerebral arteries bilaterally. This is symmetric and is felt to be artifact. Similar findings are present in the proximal anterior cerebral arteries bilaterally. No intracranial stenosis or large vessel occlusion.  No aneurysm. IMPRESSION: Acute infarct left middle frontal lobe without hemorrhage. Chronic infarct left cerebellum, left parietal lobe and moderate chronic microvascular ischemic change in the white matter Tortuous intracranial circulation causing artifact in the circle Willis. Allowing for this, no intracranial stenosis or large vessel occlusion is identified. Electronically Signed   By: Marlan Palau M.D.   On: 04/20/2020 17:37    EKG: Personally reviewed. Sinus rhythm, no acute ischemic changes, QTC 491.  Not significantly changed when compared to prior.  Assessment/Plan Principal Problem:   Acute CVA (cerebrovascular accident) [facility]) Active Problems:   Essential hypertension   Nathaniel Spears is a 45 y.o. male with medical history significant for hypertension and tobacco use who is admitted with dense expressive aphasia due to acute CVA.  Expressive aphasia due to acute infarct left middle frontal lobe Chronic infarct left cerebellum, left parietal lobe: Above findings seen on MRI brain.  MRA head limited by artifact without identified intracranial stenosis or LVO.  Neurology were consulted and recommended admission to [facility] for further stroke work-up. -Start aspirin 300  mg rectally or 325 mg orally if passes stroke swallow screen -Obtain echocardiogram, carotid  Dopplers -Check A1c, lipid panel -Monitor on telemetry, continue neurochecks -PT/OT/SLP eval -Allow permissive hypertension up to 220/120 for now, use IV labetalol as needed  Hypertension: Home amlodipine and losartan-HCTZ on hold to allow for permissive hypertension as above.  Hypokalemia: Add supplemental potassium to IV fluids.  Check magnesium.  Tobacco use: Reports smoking 1 pack/day.  Nicotine patch ordered.  DVT prophylaxis: Lovenox Code Status: Full code Family Communication: Discussed with patient Disposition Plan: From home, dispo pending further stroke work-up and PT/OT/SLP eval Consults called: Neurology Level of care: Telemetry Medical Admission status:  Status is: Observation  The patient remains OBS appropriate and will d/c before 2 midnights.  Dispo: The patient is from: Home              Anticipated d/c is to: Home versus SNF versus CIR              Patient currently is not medically stable to d/c.   Nathaniel McleanVishal Trayvond Viets MD [facility]  If 7PM-7AM, please contact night-coverage [email or web address]  04/20/2020, 7:18 PM

## 2020-04-20 NOTE — Consult Note (Signed)
Neurology Consultation Reason for Consult: L MCA stroke on MRI  Requesting Physician: Zada Finders  CC:   History is obtained from:   HPI: Nathaniel Spears is a 45 y.o. male with a past medical history significant for hypertension, ongoing smoking  Per chart review he had symptoms of headache, dizziness, abnormal speech on 02/10/2020 and was noted to be hypertensive but left without being seen due to long wait time.  He has been evaluated via telephone by internal medicine nurse practitioner on 1/28 and was noted to be having the same persistent symptoms x3 days additionally with "intentional biting of his tongue."  He was instructed to present to the ED for evaluation but there are no records of him having presented. Per girlfriend, he was having difficulty with his walking but this improved after a few days.   Monday night he was starting to slur his words but wanted to watch the [employer]. Tuesday morning he was still talking okay, Tuesday evening he could shake his head (yes/no) but longer sentences were blurred.  Words were mumble-jumbled last night  At this time history from the patient is limited due to severe aphasia. Per girlfriend, he has been stressed as girlfriend is supporting the family, he is helpful at home but stressed about not having a job. Drinks alcohol (not daily, 1-2 beers a few times a week). Smokes cigarettes (1-1.5 ppd), no other drug use known to his partner.   Has been complaining of blurry vision (had to get close to read cereal labels at the grocery store). Bites tongue when eating (worsening since January). Left leg weakness hasn't been noticed by partner but gait has been off -- last 1 week or so.   Partner is unsure if the following is true but states he reported diagnosis of cancer at [facility] and told her he was taking a pill for chemo but he won't give her information on this. She notes no infusion chemotherapy type visits.  No supportive records for  this found in the EMR  LKW: Monday morning (04/19/2020) tPA given?: No, due to out of the window Premorbid modified rankin scale:     1 - No significant disability. Able to carry out all usual activities, despite some symptoms.  ROS: Unable to obtain due to aphasia, but obtained from wife as above  Past Medical History:  Diagnosis Date  . Hypertension    History reviewed. No pertinent surgical history.  Current Outpatient Medications  Medication Instructions  . amLODipine (NORVASC) 10 MG tablet TAKE 1 TABLET BY MOUTH DAILY.  Marland Kitchen losartan-hydrochlorothiazide (HYZAAR) 100-25 MG tablet 1 tablet, Oral, Daily  . losartan-hydrochlorothiazide (HYZAAR) 100-25 MG tablet TAKE 1 TABLET BY MOUTH DAILY.   History reviewed. No pertinent family history. Unable to reliably obtain given aphasia  Social History:  reports that he has been smoking. He has never used smokeless tobacco. He reports previous alcohol use. He reports that he does not use drugs.  Initially when asked about cocaine use he denies it, but when told urine was positive he endorses use and endorses understanding that he needs to stop using this to reduce the risk of future stroke  Exam: Current vital signs: BP (!) 173/107 (BP Location: Left Arm)   Pulse 91   Temp 98.2 F (36.8 C) (Oral)   Resp 20   Ht '6\' 1"'  (1.854 m)   Wt 113.3 kg   SpO2 99%   BMI 32.95 kg/m  Vital signs in last 24 hours:  Temp:  [98.2 F (36.8 C)-98.8 F (37.1 C)] 98.2 F (36.8 C) (04/05 2210) Pulse Rate:  [84-96] 91 (04/05 2210) Resp:  [15-39] 20 (04/05 2210) BP: (146-181)/(87-122) 173/107 (04/05 2210) SpO2:  [96 %-100 %] 99 % (04/05 2210) Weight:  [113.3 kg-113.4 kg] 113.3 kg (04/05 2210)   Physical Exam  Constitutional: Appears well-developed and well-nourished.  Psych: Affect appropriate to situation, calm and cooperative, interestingly minimally frustrated by his speech issues Eyes: No scleral injection HENT: No oropharyngeal obstruction.   MSK: no joint deformities.  Cardiovascular: Perfusing extremities well, regular rate Respiratory: Effort normal, non-labored breathing GI: Soft.  No distension. There is no tenderness.  Skin: Warm dry and intact visible skin  Neuro: Mental Status: Patient is awake, alert, oriented to person, month by choice Patient is nonverbal but appropriately shakes his head yes/no to most questions.  Occasionally have some difficulty following a command or is slow to process the command but overall correctly perform all tasks requested No evidence of neglect Cranial Nerves: II: Visual Fields are full to orienting to wiggling fingers. Pupils are equal, round, and reactive to light 5 to 3 mm bilaterally III,IV, VI: EOMI without ptosis or diploplia though pursuits are saccadic.  V: Facial sensation is symmetric to temperature VII: Facial movement is symmetric.  VIII: hearing is intact to voice X: Uvula elevates symmetrically XI: Head turn is symmetric. XII: tongue is midline without atrophy or fasciculations.  Motor: Bulk is normal. 5/5 strength was present in all four extremities other than left hip flexor weakness 4/5 which she reports is his baseline Sensory: Sensation is symmetric to light touch and temperature in the arms and legs.  There is mild length dependent loss of temperature sensation in legs Deep Tendon Reflexes: Subtle but 2+ symmetric in the biceps and patellae.  Cerebellar: FNF and HKS are intact bilaterally  NIHSS total 7 Score breakdown: 2 points for not answering questions correctly, 3 points for being mute: 2 points for severe dysarthria   I have reviewed labs in epic and the results pertinent to this consultation are: Normal CBC.  CMP notable for mild hypokalemia at 3.3, hypocalcemia at 8.8, normal creatinine at 0.86  UDS positive for cocaine Lab Results  Component Value Date   CHOL 103 04/20/2020   HDL 26 (L) 04/20/2020   LDLCALC 53 04/20/2020   TRIG 118 04/20/2020    CHOLHDL 4.0 04/20/2020   Lab Results  Component Value Date   HGBA1C 11.1 (H) 04/20/2020     I have personally reviewed the images obtained: Head CT without clear acute intracranial process though multiple remote infarcts and significant chronic microvascular changes MRI brain with left MCA distribution patchy strokes MRA with bilateral narrowing of the middle cerebral arteries felt to be artifactual secondary to vessel tortuosity by radiology   Impression: This is a 45 year old male with a past medical history significant for hypertension, new diagnosis of diabetes, ongoing tobacco abuse, cocaine abuse, presenting with a left MCA territory stroke.  Suspect cardioembolic versus atheroembolic given pattern of prior strokes.  Cocaine induced vasculopathy is also possibility.  Given possible history of malignancy will additionally obtain an ESR and if this is elevated would consider CT chest abdomen pelvis for malignancy screening  Recommendations:  # Left MCA territory stroke - Stroke labs TSH, ESR, HgbA1c, fasting lipid panel - MRI brain  - MRA limited secondary to artifact so will obtain CTA  - Frequent neuro checks - Echocardiogram - Prophylactic therapy-Antiplatelet med: Aspirin - dose 367m PO  or 354m PR, followed by 81 mg daily  - Consider Plavix 300 mg load with 75 mg daily for 21 - 90 day course pending [facility]CHO [facility] - Risk factor modification, counseling on cocaine and tobacco cessation, diet, exercise, weight loss, medication compliance - Telemetry monitoring - Blood pressure goal   - Permissive hypertension overnight while awaiting further vessel imaging; systolic blood pressure less than 180 given patient has been tolerating this without worsening examination - PT consult, OT consult, Speech consult - Stroke team to follow  # New diagnosis of diabetes -Appreciate primary team management, given high A1c may need insulin on discharge and  significant education which will be extra challenging in the setting of his aphasia   SLesleigh NoeMD-PhD [facility] [phone] 7 PM to 7 AM, outside of these hours please call Neurologist on call as listed on Amion.

## 2020-04-20 NOTE — ED Notes (Signed)
[facility] ETA 15 minutes

## 2020-04-20 NOTE — ED Triage Notes (Signed)
Aphasia, L sided facial droop since yesterday, as per girlfriend pts last known well 9am yesterday.

## 2020-04-21 ENCOUNTER — Observation Stay (HOSPITAL_COMMUNITY): Payer: Medicaid Other

## 2020-04-21 DIAGNOSIS — R4701 Aphasia: Secondary | ICD-10-CM

## 2020-04-21 DIAGNOSIS — I161 Hypertensive emergency: Secondary | ICD-10-CM | POA: Diagnosis present

## 2020-04-21 DIAGNOSIS — R29810 Facial weakness: Secondary | ICD-10-CM | POA: Diagnosis present

## 2020-04-21 DIAGNOSIS — F1910 Other psychoactive substance abuse, uncomplicated: Secondary | ICD-10-CM

## 2020-04-21 DIAGNOSIS — I63512 Cerebral infarction due to unspecified occlusion or stenosis of left middle cerebral artery: Secondary | ICD-10-CM | POA: Diagnosis present

## 2020-04-21 DIAGNOSIS — F17210 Nicotine dependence, cigarettes, uncomplicated: Secondary | ICD-10-CM | POA: Diagnosis present

## 2020-04-21 DIAGNOSIS — R29706 NIHSS score 6: Secondary | ICD-10-CM | POA: Diagnosis not present

## 2020-04-21 DIAGNOSIS — E876 Hypokalemia: Secondary | ICD-10-CM | POA: Diagnosis not present

## 2020-04-21 DIAGNOSIS — Z20822 Contact with and (suspected) exposure to covid-19: Secondary | ICD-10-CM | POA: Diagnosis present

## 2020-04-21 DIAGNOSIS — E119 Type 2 diabetes mellitus without complications: Secondary | ICD-10-CM | POA: Diagnosis not present

## 2020-04-21 DIAGNOSIS — I6389 Other cerebral infarction: Secondary | ICD-10-CM

## 2020-04-21 DIAGNOSIS — E1165 Type 2 diabetes mellitus with hyperglycemia: Secondary | ICD-10-CM

## 2020-04-21 DIAGNOSIS — I10 Essential (primary) hypertension: Secondary | ICD-10-CM

## 2020-04-21 DIAGNOSIS — F1410 Cocaine abuse, uncomplicated: Secondary | ICD-10-CM | POA: Diagnosis present

## 2020-04-21 DIAGNOSIS — R29705 NIHSS score 5: Secondary | ICD-10-CM | POA: Diagnosis present

## 2020-04-21 DIAGNOSIS — I639 Cerebral infarction, unspecified: Secondary | ICD-10-CM

## 2020-04-21 DIAGNOSIS — Z79899 Other long term (current) drug therapy: Secondary | ICD-10-CM | POA: Diagnosis not present

## 2020-04-21 DIAGNOSIS — E785 Hyperlipidemia, unspecified: Secondary | ICD-10-CM | POA: Diagnosis present

## 2020-04-21 DIAGNOSIS — Z6832 Body mass index (BMI) 32.0-32.9, adult: Secondary | ICD-10-CM | POA: Diagnosis not present

## 2020-04-21 DIAGNOSIS — K5901 Slow transit constipation: Secondary | ICD-10-CM | POA: Diagnosis not present

## 2020-04-21 DIAGNOSIS — R29708 NIHSS score 8: Secondary | ICD-10-CM | POA: Diagnosis not present

## 2020-04-21 DIAGNOSIS — E669 Obesity, unspecified: Secondary | ICD-10-CM | POA: Diagnosis present

## 2020-04-21 LAB — ECHOCARDIOGRAM COMPLETE
AR max vel: 1.76 cm2
AV Area VTI: 1.72 cm2
AV Area mean vel: 1.78 cm2
AV Mean grad: 4 mmHg
AV Peak grad: 6.9 mmHg
Ao pk vel: 1.31 m/s
Area-P 1/2: 3.46 cm2
Height: 73 in
MV VTI: 1.34 cm2
S' Lateral: 3.5 cm
Weight: 3996.5 oz

## 2020-04-21 LAB — GLUCOSE, CAPILLARY
Glucose-Capillary: 180 mg/dL — ABNORMAL HIGH (ref 70–99)
Glucose-Capillary: 190 mg/dL — ABNORMAL HIGH (ref 70–99)
Glucose-Capillary: 200 mg/dL — ABNORMAL HIGH (ref 70–99)
Glucose-Capillary: 215 mg/dL — ABNORMAL HIGH (ref 70–99)
Glucose-Capillary: 290 mg/dL — ABNORMAL HIGH (ref 70–99)

## 2020-04-21 LAB — BASIC METABOLIC PANEL
Anion gap: 9 (ref 5–15)
BUN: 6 mg/dL (ref 6–20)
CO2: 26 mmol/L (ref 22–32)
Calcium: 8.4 mg/dL — ABNORMAL LOW (ref 8.9–10.3)
Chloride: 103 mmol/L (ref 98–111)
Creatinine, Ser: 0.9 mg/dL (ref 0.61–1.24)
GFR, Estimated: >60 mL/min (ref >60)
Glucose, Bld: 197 mg/dL — ABNORMAL HIGH (ref 70–99)
Potassium: 3.5 mmol/L (ref 3.5–5.1)
Sodium: 138 mmol/L (ref 135–145)

## 2020-04-21 LAB — HIV ANTIBODY (ROUTINE TESTING W REFLEX): HIV Screen 4th Generation wRfx: NONREACTIVE

## 2020-04-21 LAB — MAGNESIUM: Magnesium: 1.9 mg/dL (ref 1.7–2.4)

## 2020-04-21 LAB — TSH: TSH: 1.156 u[IU]/mL (ref 0.350–4.500)

## 2020-04-21 LAB — SEDIMENTATION RATE: Sed Rate: 3 mm/hr (ref 0–16)

## 2020-04-21 IMAGING — CT CT ANGIO HEAD
2 of 11 series · 7 of 33 positions shown · IV contrast (OMNI 350)
Comparison: Previous MRI from [DATE]

CLINICAL DATA: Follow-up examination for acute stroke.



[Series 7: cta neck · axial · 0.51mm/px · z∈[+1151,+1279]mm · 2 of 192 slices shown]
[im 64/192  soft-tissue]
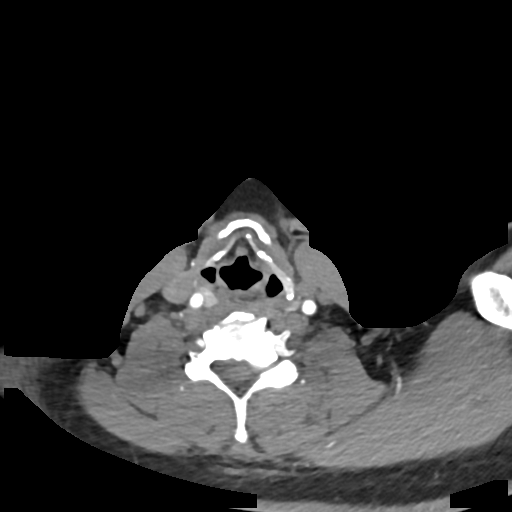
[im 128/192  soft-tissue]
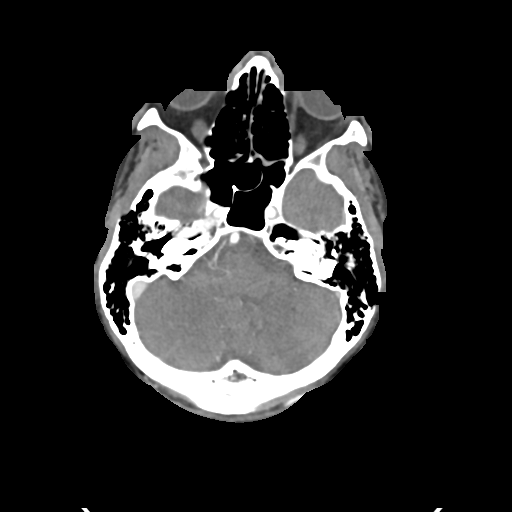

[Series 9: cta neck axial · axial · 0.39mm/px · z∈[+1089,+1344]mm · 5 of 383 slices shown]
[im 64/383  soft-tissue]
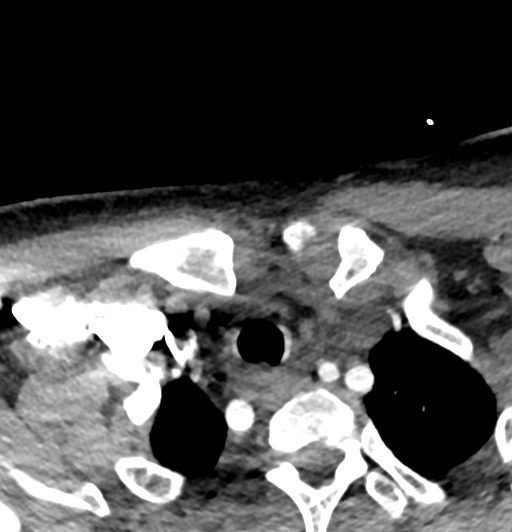
[im 128/383  bone]
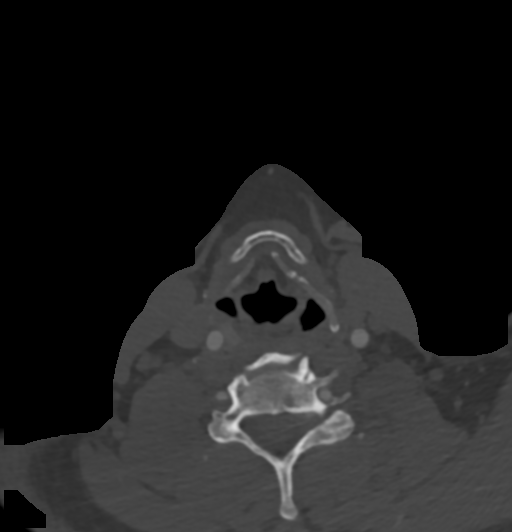
[im 192/383  soft-tissue]
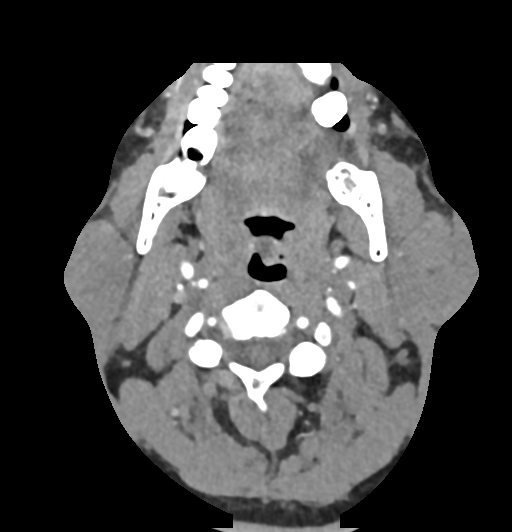
[im 255/383  bone]
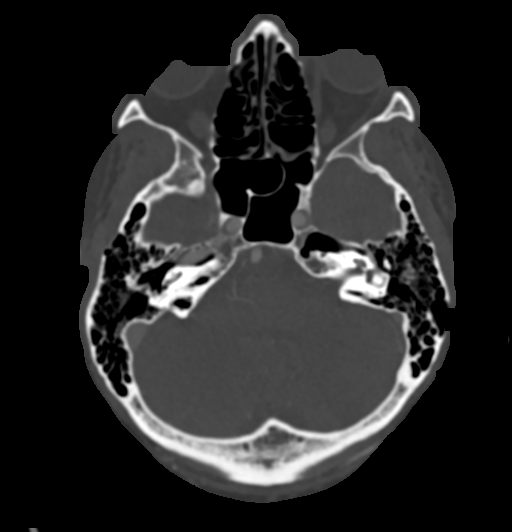
[im 319/383  soft-tissue]
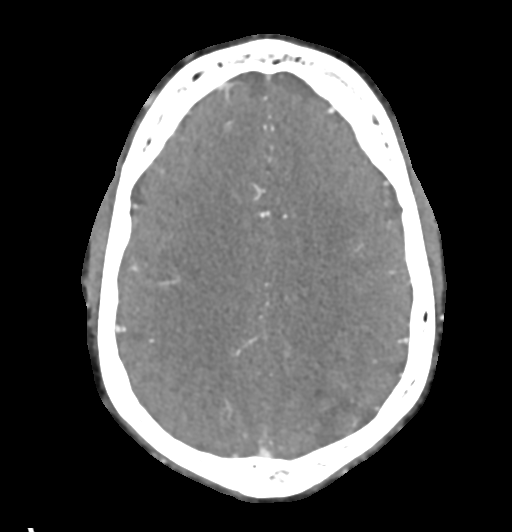

[7 of 33 positions shown; findings below may reference images not displayed]

FINDINGS: CT HEAD FINDINGS

Brain: Age-related cerebral atrophy with chronic small vessel
ischemic disease. Chronic ischemic infarcts involving the left
parietal lobe and left cerebellum noted. Previously identified
patchy small volume infarcts involving the left frontal operculum
are grossly stable in size and distribution from previous, better
evaluated on prior brain MRI. No evidence for hemorrhagic
transformation, mass effect, or other complication. No other acute
large vessel territory infarct or hemorrhage. No mass lesion or
midline shift. No hydrocephalus or extra-axial fluid collection.

Vascular: No hyperdense vessel.

Skull: Scalp soft tissues and calvarium within normal limits.

Sinuses: Paranasal sinuses and mastoid air cells are clear.

Orbits: Globes and orbital soft tissues demonstrate no acute
finding.

Review of the MIP images confirms the above findings

CTA NECK FINDINGS

Aortic arch: Visualized aortic arch normal in caliber. Aberrant
right subclavian artery coursing posteriorly to the esophagus noted.
No hemodynamically significant stenosis about the origin of the
great vessels.

Right carotid system: Right common and internal carotid arteries
widely patent without stenosis, dissection or occlusion.

Left carotid system: Left common and internal carotid arteries
widely patent without stenosis, dissection or occlusion.

Vertebral arteries: Both vertebral arteries arise from the
subclavian arteries. No proximal subclavian artery stenosis. Both
vertebral arteries widely patent without stenosis, dissection or
occlusion.

Skeleton: No visible acute osseous abnormality. No discrete or
worrisome osseous lesions. Moderate cervical spondylosis noted at
C5-6 and C6-7 without high-grade stenosis.

Other neck: No other acute soft tissue abnormality within the neck.
8 mm nodule present at the inferior left thyroid lobe, felt to be of
doubtful significance given size and patient age, no follow-up
imaging recommended (ref: [HOSPITAL]. [DATE]):
143-50).No other mass or adenopathy. Esophageal prominence at the
level of the aberrant right subclavian artery noted.

Upper chest: Visualized upper chest demonstrates no acute finding.
Partially visualized lungs are clear.

Review of the MIP images confirms the above findings

CTA HEAD FINDINGS

Anterior circulation: Petrous, cavernous, and supraclinoid segments
widely patent without stenosis or other abnormality. A1 segments
widely patent. Normal anterior communicating artery complex.
Anterior cerebral arteries patent to their distal aspects without
stenosis. No M1 stenosis or occlusion. Normal MCA bifurcations.
There is age advanced severe atheromatous disease involving the MCA
branches bilaterally. Associated multifocal moderate to severe
proximal M2 stenoses (series 9, images 112, 114 on the left, series
9, images 105, 101 on the right. Additional moderate to severe
atheromatous changes seen distally within the MCA branches as well,
with particular note made of a severe distal left M3 stenosis
(series 9, image 85) as well as a severe distal right M2/M3 stenosis
(series 9, image 98). MCA branches remain perfused and grossly
symmetric at this time.

Posterior circulation: Both V4 segments patent to the
vertebrobasilar junction without stenosis. Both PICA origins patent
and normal. Basilar patent to its distal aspect without stenosis.
Superior cerebellar arteries patent bilaterally. Both PCAs primarily
supplied via the basilar. Mild atheromatous irregularity within the
P2 segments without high-grade stenosis.

Venous sinuses: Grossly patent allowing for timing the contrast
bolus.

Anatomic variants: None significant.  No aneurysm.

Review of the MIP images confirms the above findings
IMPRESSION: CT HEAD IMPRESSION:

1. Continued interval evolution in patchy small volume acute left
MCA distribution infarcts, grossly stable and better evaluated on
recent MRI from [DATE]. No evidence for hemorrhagic
transformation or other complication.
2. No other new acute intracranial abnormality.
3. Chronic left parietal and left cerebellar infarcts.
4. Underlying moderate to severe chronic microvascular ischemic
disease for age.

CTA HEAD AND NECK IMPRESSION:

1. Negative CTA for large vessel occlusion.
2. Severe atheromatous disease involving the distal MCA branches
bilaterally with associated moderate to severe multifocal bilateral
M2 and M3 stenoses as above. The severely diseased MCA branches
remain perfused and grossly symmetric at this time.
3. Otherwise wide patency of the major arterial vasculature of the
head and neck. No other proximal high-grade or correctable stenosis.
4. Aberrant right subclavian artery.

## 2020-04-21 MED ORDER — ASPIRIN 300 MG RE SUPP: 300.0000 mg | Freq: Every day | RECTAL | Status: DC

## 2020-04-21 MED ORDER — CLOPIDOGREL BISULFATE 75 MG PO TABS
75.0000 mg | ORAL_TABLET | Freq: Every day | ORAL | Status: DC
  Administered 2020-04-21 – 2020-04-23 (×3): 75 mg via ORAL
  Filled 2020-04-21 (×3): qty 1

## 2020-04-21 MED ORDER — ATORVASTATIN CALCIUM 80 MG PO TABS
80.0000 mg | ORAL_TABLET | Freq: Every day | ORAL | Status: DC
  Administered 2020-04-21: 80 mg via ORAL
  Filled 2020-04-21: qty 1

## 2020-04-21 MED ORDER — INSULIN STARTER KIT- PEN NEEDLES (ENGLISH)
1.0000 | Freq: Once | Status: AC
  Administered 2020-04-21: 1
  Filled 2020-04-21: qty 1

## 2020-04-21 MED ORDER — ASPIRIN 81 MG PO CHEW: 81.0000 mg | CHEWABLE_TABLET | Freq: Every day | ORAL | Status: DC

## 2020-04-21 MED ORDER — PERFLUTREN LIPID MICROSPHERE
1.0000 mL | INTRAVENOUS | Status: AC | PRN
  Administered 2020-04-21: 3 mL via INTRAVENOUS
  Filled 2020-04-21: qty 10

## 2020-04-21 MED ORDER — LIVING WELL WITH DIABETES BOOK
Freq: Once | Status: AC
  Filled 2020-04-21: qty 1

## 2020-04-21 MED ORDER — IOHEXOL 350 MG/ML SOLN
80.0000 mL | Freq: Once | INTRAVENOUS | Status: AC | PRN
  Administered 2020-04-21: 80 mL via INTRAVENOUS

## 2020-04-21 MED ORDER — ASPIRIN EC 325 MG PO TBEC
325.0000 mg | DELAYED_RELEASE_TABLET | Freq: Every day | ORAL | Status: DC
  Administered 2020-04-21 – 2020-04-23 (×3): 325 mg via ORAL
  Filled 2020-04-21 (×3): qty 1

## 2020-04-21 MED ORDER — ATORVASTATIN CALCIUM 40 MG PO TABS
40.0000 mg | ORAL_TABLET | Freq: Every day | ORAL | Status: DC
  Administered 2020-04-22 – 2020-04-23 (×2): 40 mg via ORAL
  Filled 2020-04-21 (×2): qty 1

## 2020-04-21 NOTE — Progress Notes (Signed)
Rehab Admissions Coordinator Note:  Patient was screened by Clois Dupes for appropriateness for an Inpatient Acute Rehab Consult per therapy recs. .  At this time, we are recommending Inpatient Rehab consult. I will place order per protocol.  Clois Dupes RN MSN 04/21/2020, 2:08 PM  I can be reached at [phone].

## 2020-04-21 NOTE — Evaluation (Signed)
Occupational Therapy Evaluation Patient Details Name: Nathaniel Spears MRN: [id number] DOB: [birth year] Today's Date: 04/21/2020    History of Present Illness This is a 45 year old male with a past medical history significant for hypertension, new diagnosis of diabetes, ongoing tobacco abuse, cocaine abuse, presenting with a left MCA territory stroke.   Clinical Impression   Pt is typically Independent in ADL and mobility. Today presents with expressive difficulties, apraxia, visual deficits, balance deficits, and decreased independence. Overall min guard to mod A for ADL. Pt encouraged to attempt to vocalize during session - largely communicating through head nods/shakes. Did better vocalizing when given two choices. "Is your favorite food meatloaf or pizza?" When at the sink required cues for finding grooming tools. Pt reporting blurred vision and double vision. (see visual assessment below). Pt will benefit from skilled OT in the acute setting as well as afterwards at the CIR level to maximize safety and independence in ADL and functional transfers, as well as vision and expressive language. Next session to focus on apraxia during functional ADL tasks and vision.     Follow Up Recommendations  CIR    Equipment Recommendations  Other (comment) (defer to next venue of care)    Recommendations for Other Services Rehab consult     Precautions / Restrictions Precautions Precautions: Fall Restrictions Weight Bearing Restrictions: No      Mobility Bed Mobility               General bed mobility comments: OOB in chair at beginning and end of session    Transfers Overall transfer level: Needs assistance Equipment used: 1 person hand held assist Transfers: Sit to/from Stand;Stand Pivot Transfers Sit to Stand: Min assist Stand pivot transfers: Min assist       General transfer comment: Min A for balance    Balance Overall balance assessment: Needs assistance Sitting-balance  support: No upper extremity supported;Feet supported Sitting balance-Leahy Scale: Good Sitting balance - Comments: unchallenged   Standing balance support: Single extremity supported;No upper extremity supported;During functional activity Standing balance-Leahy Scale: Fair Standing balance comment: better with at least SUE support                           ADL either performed or assessed with clinical judgement   ADL Overall ADL's : Needs assistance/impaired Eating/Feeding: Minimal assistance;Sitting Eating/Feeding Details (indicate cue type and reason): to open some containers Grooming: Minimal assistance;Sitting;Wash/dry face;Cueing for sequencing Grooming Details (indicate cue type and reason): cues to visually find tools, sequence, remember to turn off water Upper Body Bathing: Minimal assistance   Lower Body Bathing: Minimal assistance   Upper Body Dressing : Min guard;Sitting Upper Body Dressing Details (indicate cue type and reason): extra time and effort due to apraxic movements Lower Body Dressing: Moderate assistance;Sit to/from stand Lower Body Dressing Details (indicate cue type and reason): extra time and effort due to apraxic movements Toilet Transfer: Minimal assistance;Ambulation   Toileting- Clothing Manipulation and Hygiene: Minimal assistance;Sit to/from stand       Functional mobility during ADLs: Minimal assistance;Cueing for safety       Vision Baseline Vision/History: No visual deficits Patient Visual Report: Blurring of vision;Diplopia Vision Assessment?: Yes Eye Alignment: Impaired (comment) (slight disconjugate gaze) Ocular Range of Motion: Within Functional Limits Alignment/Gaze Preference: Within Defined Limits Tracking/Visual Pursuits: Impaired - to be further tested in functional context (inconsistent, sometimes turns head (to R)) Convergence: Within functional limits Diplopia Assessment: Disappears with one eye closed Depth  Perception: Overshoots (also with apraxia - so mixture of vision/motor?) Additional Comments: difficult to assess with expression difficulties     Perception     Praxis      Pertinent Vitals/Pain Pain Assessment: No/denies pain     Hand Dominance Left   Extremity/Trunk Assessment Upper Extremity Assessment Upper Extremity Assessment: Generalized weakness (Notable apraxic and dyscoordinated movements)   Lower Extremity Assessment Lower Extremity Assessment: Defer to PT evaluation   Cervical / Trunk Assessment Cervical / Trunk Assessment: Normal   Communication Communication Communication: Expressive difficulties   Cognition Arousal/Alertness: Awake/alert Behavior During Therapy: WFL for tasks assessed/performed Overall Cognitive Status: Difficult to assess                                 General Comments: encouraged Pt to use voice/words throughout (tends to want to shake head yes or no) unable to word find when asked open ended questions, but can choose given 2 choices.   General Comments  girlfriend present during session    Exercises     Shoulder Instructions      Home Living Family/patient expects to be discharged to:: Private residence Living Arrangements: Spouse/significant other Available Help at Discharge: Family Type of Home: House Home Access: Stairs to enter [employer] of Steps:  (Not entirely clear)   Home Layout: Two level;Bed/bath upstairs Alternate Level Stairs-Number of Steps: Flight                 Additional Comments: impaired communication      Prior Functioning/Environment Level of Independence: Independent                 OT Problem List: Impaired balance (sitting and/or standing);Impaired vision/perception;Decreased coordination;Decreased cognition;Decreased safety awareness;Obesity;Impaired UE functional use      OT Treatment/Interventions: Self-care/ADL training;Therapeutic exercise;DME and/or  AE instruction;Therapeutic activities;Cognitive remediation/compensation;Visual/perceptual remediation/compensation;Balance training;Patient/family education    OT Goals(Current goals can be found in the care plan section) Acute Rehab OT Goals Patient Stated Goal: talk better OT Goal Formulation: With patient/family Time For Goal Achievement: 05/05/20 Potential to Achieve Goals: Good ADL Goals Pt Will Perform Grooming: with modified independence;standing Pt Will Perform Upper Body Dressing: with modified independence;sitting Pt Will Perform Lower Body Dressing: with modified independence;sit to/from stand Pt Will Transfer to Toilet: with modified independence;ambulating Pt Will Perform Toileting - Clothing Manipulation and hygiene: with modified independence;sit to/from stand  OT Frequency: Min 2X/week   Barriers to D/C:            Co-evaluation              AM-PAC OT "6 Clicks" Daily Activity     Outcome Measure Help from another person eating meals?: A Little Help from another person taking care of personal grooming?: A Little Help from another person toileting, which includes using toliet, bedpan, or urinal?: A Little Help from another person bathing (including washing, rinsing, drying)?: A Little Help from another person to put on and taking off regular upper body clothing?: A Little Help from another person to put on and taking off regular lower body clothing?: A Little 6 Click Score: 18   End of Session Equipment Utilized During Treatment: Gait belt Nurse Communication: Mobility status;Precautions  Activity Tolerance: Patient tolerated treatment well Patient left: in chair;with call bell/phone within reach;with family/visitor present  OT Visit Diagnosis: Other abnormalities of gait and mobility (R26.89);Muscle weakness (generalized) (M62.81);Apraxia (R48.2);Other symptoms and signs involving the nervous system (  R29.898);Cognitive communication deficit  (R41.841) Symptoms and signs involving cognitive functions: Cerebral infarction                Time: 0109-3235 OT Time Calculation (min): 30 min Charges:  OT General Charges $OT Visit: 1 Visit OT Evaluation $OT Eval Moderate Complexity: 1 Mod OT Treatments $Self Care/Home Management : 8-22 mins  Nyoka Cowden OTR/L Acute Rehabilitation Services Pager: [phone] Office: [phone]  Evern Bio Roberto Hlavaty 04/21/2020, 1:33 PM

## 2020-04-21 NOTE — Evaluation (Signed)
Physical Therapy Evaluation Patient Details Name: Nathaniel Spears MRN: [id number] DOB: 1975-06-28 Today's Date: 04/21/2020   History of Present Illness  This is a 45 year old male with a past medical history significant for hypertension, new diagnosis of diabetes, ongoing tobacco abuse, cocaine abuse, presenting with a left MCA territory stroke.  Clinical Impression   Pt admitted with above diagnosis. Lives at home with significant other in a two level home with bed and bathroom upstairs; Independent at baseline; Presents to PT with decr mobility, decr balance, increased fall risk, and decr coordination; Expressive deficits as well; Following simple, one-step commands well; Notable apraxic and dyscoordinated movements with attempts at MMT; able to verbalize "bathroom", and "tar"/"tarheels", in a low voice, otherwise nodded or shook head, and used facial expressions for communication; Pt currently with functional limitations due to the deficits listed below (see PT Problem List). Pt will benefit from skilled PT to increase their independence and safety with mobility to allow discharge to the venue listed below.    Pt is young, is participating well with PT, and showing good motivation to work back to independence; Highly recommend CIR for intensive post-acute therapies.     Follow Up Recommendations CIR    Equipment Recommendations  Other (comment) (to be determined)    Recommendations for Other Services Rehab consult     Precautions / Restrictions Precautions Precautions: Fall Restrictions Weight Bearing Restrictions: No      Mobility  Bed Mobility Overal bed mobility: Modified Independent             General bed mobility comments: Incr time and use of rails    Transfers Overall transfer level: Needs assistance Equipment used: 1 person hand held assist Transfers: Sit to/from Stand Sit to Stand: Min assist Stand pivot transfers: Min assist       General transfer  comment: Min A for balance  Ambulation/Gait Ambulation/Gait assistance: Min guard (with and without physical contact) Gait Distance (Feet):  (Progressive amb in hallway) Assistive device: 1 person hand held assist;None Gait Pattern/deviations: Decreased step length - right;Decreased step length - left;Decreased stride length;Wide base of support     General Gait Details: Overall stiff gait with wide BOS/step width; notably decr trunk movement and decr arm swing noted, especially with RUE  Stairs            Wheelchair Mobility    Modified Rankin (Stroke Patients Only)       Balance Overall balance assessment: Needs assistance Sitting-balance support: No upper extremity supported;Feet supported Sitting balance-Leahy Scale: Good Sitting balance - Comments: unchallenged   Standing balance support: Single extremity supported;No upper extremity supported;During functional activity Standing balance-Leahy Scale: Fair Standing balance comment: better with at least SUE support                             Pertinent Vitals/Pain Pain Assessment: No/denies pain    Home Living Family/patient expects to be discharged to:: Private residence Living Arrangements: Spouse/significant other Available Help at Discharge: Family Type of Home: House Home Access: Stairs to enter   [employer] of Steps:  (Not entirely clear) Home Layout: Two level;Bed/bath upstairs   Additional Comments: impaired communication    Prior Function Level of Independence: Independent               Hand Dominance   Dominant Hand: Left    Extremity/Trunk Assessment   Upper Extremity Assessment Upper Extremity Assessment: Generalized weakness (Notable apraxic and dyscoordinated  movements)    Lower Extremity Assessment Lower Extremity Assessment: Generalized weakness;RLE deficits/detail;LLE deficits/detail RLE Deficits / Details: Notably slightly weaker than LLE with hip  flexion, knee flexion, ankle dorsiflexion; dedr coordination with heel to shin RLE Coordination: decreased gross motor LLE Deficits / Details: decr coordination with heel to shin LLE Coordination: decreased gross motor    Cervical / Trunk Assessment Cervical / Trunk Assessment: Normal  Communication   Communication: Expressive difficulties  Cognition Arousal/Alertness: Awake/alert Behavior During Therapy: WFL for tasks assessed/performed Overall Cognitive Status: Difficult to assess                                 General Comments: encouraged Pt to use voice/words throughout (tends to want to shake head yes or no) unable to word find when asked open ended questions, but can choose given 2 choices.      General Comments General comments (skin integrity, edema, etc.): girlfriend present during session    Exercises     Assessment/Plan    PT Assessment Patient needs continued PT services  PT Problem List Decreased strength;Decreased balance;Decreased mobility;Decreased coordination;Decreased knowledge of use of DME;Decreased safety awareness;Decreased knowledge of precautions       PT Treatment Interventions DME instruction;Gait training;Stair training;Functional mobility training;Therapeutic activities;Balance training;Therapeutic exercise;Neuromuscular re-education;Cognitive remediation;Patient/family education    PT Goals (Current goals can be found in the Care Plan section)  Acute Rehab PT Goals Patient Stated Goal: talk better PT Goal Formulation: With patient Time For Goal Achievement: 05/05/20 Potential to Achieve Goals: Good Additional Goals Additional Goal #1: Pt will score more than 49/56 on teh Berg Balance Assessment and more than 21/24 on DGI to demontraed decr fall risk    Frequency Min 4X/week   Barriers to discharge        Co-evaluation               AM-PAC PT "6 Clicks" Mobility  Outcome Measure Help needed turning from your back to  your side while in a flat bed without using bedrails?: None Help needed moving from lying on your back to sitting on the side of a flat bed without using bedrails?: None Help needed moving to and from a bed to a chair (including a wheelchair)?: A Little Help needed standing up from a chair using your arms (e.g., wheelchair or bedside chair)?: A Little Help needed to walk in hospital room?: A Lot Help needed climbing 3-5 steps with a railing? : Total 6 Click Score: 17    End of Session Equipment Utilized During Treatment: Gait belt Activity Tolerance: Patient tolerated treatment well Patient left: in chair;with call bell/phone within reach;with chair alarm set Nurse Communication: Mobility status;Other (comment) (REc for CIR) PT Visit Diagnosis: Unsteadiness on feet (R26.81);Other abnormalities of gait and mobility (R26.89);Ataxic gait (R26.0);Apraxia (R48.2)    Time: 9242-6834 PT Time Calculation (min) (ACUTE ONLY): 29 min   Charges:   PT Evaluation $PT Eval Moderate Complexity: 1 Mod PT Treatments $Gait Training: 8-22 mins        Van Clines, PT  Acute Rehabilitation Services Pager [phone] Office [phone]   Levi Aland 04/21/2020, 2:22 PM

## 2020-04-21 NOTE — Progress Notes (Signed)
Inpatient Diabetes Program Recommendations  AACE/ADA: New Consensus Statement on Inpatient Glycemic Control   Target Ranges:  Prepandial:   less than 140 mg/dL      Peak postprandial:   less than 180 mg/dL (1-2 hours)      Critically ill patients:  140 - 180 mg/dL  Results for ANTWAIN, CALIENDO (MRN [id number]) as of 04/21/2020 09:36  Ref. Range 04/21/2020 00:49 04/21/2020 06:15  Glucose-Capillary Latest Ref Range: 70 - 99 mg/dL 190 (H) 180 (H)   Results for KELLIE, CHISOLM (MRN [id number]) as of 04/21/2020 09:36  Ref. Range 02/10/2020 18:58 04/20/2020 13:21 04/20/2020 22:47  Glucose Latest Ref Range: 70 - 99 mg/dL 191 (H) 267 (H) 195 (H)  Hemoglobin A1C Latest Ref Range: 4.8 - 5.6 %   11.1 (H)   Review of Glycemic Control  Diabetes history: NO Outpatient Diabetes medications: NA Current orders for Inpatient glycemic control: Novolog 0-9 units TID with meals, Novolog 0-5 units QHS  Inpatient Diabetes Program Recommendations:    HbgA1C: A1C 11.1% on 04/20/20 indicating an average glucose of 272 mg/dl over the past 2-3 months.  NOTE: Diabetes Coordinator working remotely from [employer] today. Per chart, patient has no prior DM hx. Noted patient came to ED on 02/10/20 with complaints of headache, dizziness, and abnormal speech but he left without being seen. Labs on 02/10/20 with glucose of 191 mg/dl.  Patient admitted 04/20/20 with acute CVA with expressive aphasia and hypertension. Per chart, patient is being newly dx with DM this admission. Ordered Living Well with DM book, insulin starter kit (pen), RD consult for diet education, and patient education by bedside RNs. Will ask diabetes coordinator on [place] to see patient today.  Thanks, Barnie Alderman, RN, MSN, CDE Diabetes Coordinator Inpatient Diabetes Program [phone] (Team Pager from 8am to 5pm)

## 2020-04-21 NOTE — Progress Notes (Addendum)
Inpatient Diabetes Program Recommendations  AACE/ADA: New Consensus Statement on Inpatient Glycemic Control (2015)  Target Ranges:  Prepandial:   less than 140 mg/dL      Peak postprandial:   less than 180 mg/dL (1-2 hours)      Critically ill patients:  140 - 180 mg/dL   New Diagnosis of Diabetes made this admission    Met with pt and his Girlfriend (GF) [name] at bedside.  Pt with aphasia but able to say some words.  Has some weakness on right side.  Pt was Alert and oriented and able to follow conversation.  GF at bedside very supportive and told me she has Type 2 DM (diet controlled and has lost 200#) and GF also has experience with giving insulin as she had a Daughter with Type 1 Diabetes (daughter has since passed away) and GF told me she went to diabetes classes with her daughter when her daughter was diagnosed.    Reviewed several pages of the Living well with diabetes book that was at bedside.  Note RD has met w/ pt.  Plan to ask RNs working with pt to begin insulin education and CBG meter teaching.  Spoke with pt and his GF about new diagnosis.  Discussed A1C results with them and explained what an A1C is, basic pathophysiology of DM Type 2, basic home care, basic diabetes diet nutrition principles, importance of checking CBGs and maintaining good CBG control to prevent long-term and short-term complications.  Reviewed signs and symptoms of hyperglycemia and hypoglycemia and how to treat hypoglycemia at home.  Also reviewed blood sugar goals and A1c goals for home.    Also demonstrated how to use Insulin pen at home with both GF and patient.  GF very familiar with Insulin pen use and was able to verbalize all steps while I demo'ed the pen.  Insulin Pen teaching kit at bedside.    Per GF, pt may go to [facility] for further care.  Have ordered OP DM education consult, however, I have asked the OP center to wait to make an appt until after pt discharged home.    --Will follow patient  during hospitalization--  Wyn Quaker RN, MSN, CDE Diabetes Coordinator Inpatient Glycemic Control Team Team Pager: [phone] ([id number])

## 2020-04-21 NOTE — Progress Notes (Addendum)
STROKE TEAM PROGRESS NOTE   INTERVAL HISTORY No acute events since arrival Sitting up in chair at bedside eating breakfast in NAD.  Obvious frustration with word finding difficulties. Not able to state name but able to name objects and appropriately indicate yes/no with head nods/shakes.   Stroke diagnosis and ongoing work up explained. Risk factors discussed. Advised to stop smoking, using cocaine, THC. He indicated he did not have PCP. Advised to closely follow with PCP to control multiple stroke risk factors. He nodded understanding.   Vitals:   04/21/20 0410 04/21/20 0614 04/21/20 0809 04/21/20 1227  BP: (!) 149/103 (!) 153/103 (!) 155/103 (!) 143/97  Pulse: 83 86 84 99  Resp: Temp: 98.7 F (37.1 C) 98.7 F (37.1 C) 98.4 F (36.9 C) 98.8 F (37.1 C)  TempSrc: Oral Oral Oral Oral  SpO2: 98% 94% 95% 97%  Weight:      Height:       CBC:  Recent Labs  Lab 04/20/20 1321 04/20/20 2247  WBC 7.9 8.0  NEUTROABS 4.8  --   HGB 15.2 14.5  HCT 46.2 44.4  MCV 81.8 80.6  PLT 206 179   Basic Metabolic Panel:  Recent Labs  Lab 04/20/20 2247 04/21/20 0842  NA 135 138  K 3.1* 3.5  CL 102 103  CO2 25 26  GLUCOSE 195* 197*  BUN 9 6  CREATININE 0.86 0.90  CALCIUM 8.5* 8.4*  MG 1.8 1.9   Lipid Panel:  Recent Labs  Lab 04/20/20 2247  CHOL 103  TRIG 118  HDL 26*  CHOLHDL 4.0  VLDL 24  LDLCALC 53   HgbA1c:  Recent Labs  Lab 04/20/20 2247  HGBA1C 11.1*   Urine Drug Screen:  Recent Labs  Lab 04/20/20 1939  LABOPIA NONE DETECTED  COCAINSCRNUR POSITIVE*  LABBENZ NONE DETECTED  AMPHETMU NONE DETECTED  THCU NONE DETECTED  LABBARB NONE DETECTED    Alcohol Level  Recent Labs  Lab 04/20/20 1321  ETH <10    IMAGING past 24 hours CT ANGIO HEAD W OR WO CONTRAST  Result Date: 04/21/2020 CLINICAL DATA:  Follow-up examination for acute stroke. EXAM: CT ANGIOGRAPHY HEAD AND NECK TECHNIQUE: Multidetector CT imaging of the head and neck was performed using  the standard protocol during bolus administration of intravenous contrast. Multiplanar CT image reconstructions and MIPs were obtained to evaluate the vascular anatomy. Carotid stenosis measurements (when applicable) are obtained utilizing NASCET criteria, using the distal internal carotid diameter as the denominator. CONTRAST:  80mL OMNIPAQUE IOHEXOL 350 MG/ML SOLN COMPARISON:  Previous MRI from 04/20/2020 FINDINGS: CT HEAD FINDINGS Brain: Age-related cerebral atrophy with chronic small vessel ischemic disease. Chronic ischemic infarcts involving the left parietal lobe and left cerebellum noted. Previously identified patchy small volume infarcts involving the left frontal operculum are grossly stable in size and distribution from previous, better evaluated on prior brain MRI. No evidence for hemorrhagic transformation, mass effect, or other complication. No other acute large vessel territory infarct or hemorrhage. No mass lesion or midline shift. No hydrocephalus or extra-axial fluid collection. Vascular: No hyperdense vessel. Skull: Scalp soft tissues and calvarium within normal limits. Sinuses: Paranasal sinuses and mastoid air cells are clear. Orbits: Globes and orbital soft tissues demonstrate no acute finding. Review of the MIP images confirms the above findings CTA NECK FINDINGS Aortic arch: Visualized aortic arch normal in caliber. Aberrant right subclavian artery coursing posteriorly to the esophagus noted. No hemodynamically significant stenosis about the origin of the great  vessels. Right carotid system: Right common and internal carotid arteries widely patent without stenosis, dissection or occlusion. Left carotid system: Left common and internal carotid arteries widely patent without stenosis, dissection or occlusion. Vertebral arteries: Both vertebral arteries arise from the subclavian arteries. No proximal subclavian artery stenosis. Both vertebral arteries widely patent without stenosis, dissection  or occlusion. Skeleton: No visible acute osseous abnormality. No discrete or worrisome osseous lesions. Moderate cervical spondylosis noted at C5-6 and C6-7 without high-grade stenosis. Other neck: No other acute soft tissue abnormality within the neck. 8 mm nodule present at the inferior left thyroid lobe, felt to be of doubtful significance given size and patient age, no follow-up imaging recommended (ref: J Am Coll Radiol. 2015 Feb;12(2): 143-50).No other mass or adenopathy. Esophageal prominence at the level of the aberrant right subclavian artery noted. Upper chest: Visualized upper chest demonstrates no acute finding. Partially visualized lungs are clear. Review of the MIP images confirms the above findings CTA HEAD FINDINGS Anterior circulation: Petrous, cavernous, and supraclinoid segments widely patent without stenosis or other abnormality. A1 segments widely patent. Normal anterior communicating artery complex. Anterior cerebral arteries patent to their distal aspects without stenosis. No M1 stenosis or occlusion. Normal MCA bifurcations. There is age advanced severe atheromatous disease involving the MCA branches bilaterally. Associated multifocal moderate to severe proximal M2 stenoses (series 9, images 112, 114 on the left, series 9, images 105, 101 on the right. Additional moderate to severe atheromatous changes seen distally within the MCA branches as well, with particular note made of a severe distal left M3 stenosis (series 9, image 85) as well as a severe distal right M2/M3 stenosis (series 9, image 98). MCA branches remain perfused and grossly symmetric at this time. Posterior circulation: Both V4 segments patent to the vertebrobasilar junction without stenosis. Both PICA origins patent and normal. Basilar patent to its distal aspect without stenosis. Superior cerebellar arteries patent bilaterally. Both PCAs primarily supplied via the basilar. Mild atheromatous irregularity within the P2 segments  without high-grade stenosis. Venous sinuses: Grossly patent allowing for timing the contrast bolus. Anatomic variants: None significant.  No aneurysm. Review of the MIP images confirms the above findings IMPRESSION: CT HEAD IMPRESSION: 1. Continued interval evolution in patchy small volume acute left MCA distribution infarcts, grossly stable and better evaluated on recent MRI from 04/20/2020. No evidence for hemorrhagic transformation or other complication. 2. No other new acute intracranial abnormality. 3. 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 4. Underlying moderate to severe chronic microvascular ischemic disease for age. CTA HEAD AND NECK IMPRESSION: 1. Negative CTA for large vessel occlusion. 2. 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 The severely diseased MCA branches remain perfused and grossly symmetric at this time. 3. Otherwise wide patency of the major arterial vasculature of the head and neck. No other proximal high-grade or correctable stenosis. 4. Aberrant right subclavian artery. Electronically Signed   By: Rise Mu M.D.   On: 04/21/2020 04:12   CT HEAD WO CONTRAST  Result Date: 04/20/2020 CLINICAL DATA:  Aphasia, left-sided facial droop starting yesterday. EXAM: CT HEAD WITHOUT CONTRAST TECHNIQUE: Contiguous axial images were obtained from the base of the skull through the vertex without intravenous contrast. COMPARISON:  02/10/2020 FINDINGS: Brain: Remote infarcts of the left cerebellum and left parietal lobe. Periventricular white matter and corona radiata hypodensities favor chronic ischemic microvascular white matter disease. Otherwise, the brainstem, cerebellum, cerebral peduncles, thalamus, basal  ganglia, basilar cisterns, and ventricular system appear within normal limits. No intracranial hemorrhage, mass lesion, or acute CVA. Vascular: Unremarkable  Skull: Unremarkable Sinuses/Orbits: Suspected mucosal swelling in the nasal cavity. Otherwise unremarkable. Other: No supplemental non-categorized findings. IMPRESSION: 1. Remote infarcts of the left cerebellum and left parietal lobe. Periventricular white matter and corona radiata hypodensities favor chronic ischemic microvascular white matter disease. 2. No acute intracranial findings. Electronically Signed   By: Gaylyn Rong M.D.   On: 04/20/2020 14:48   CT ANGIO NECK W OR WO CONTRAST  Result Date: 04/21/2020 CLINICAL DATA:  Follow-up examination for acute stroke. EXAM: CT ANGIOGRAPHY HEAD AND NECK TECHNIQUE: Multidetector CT imaging of the head and neck was performed using the standard protocol during bolus administration of intravenous contrast. Multiplanar CT image reconstructions and MIPs were obtained to evaluate the vascular anatomy. Carotid stenosis measurements (when applicable) are obtained utilizing NASCET criteria, using the distal internal carotid diameter as the denominator. CONTRAST:  12mL OMNIPAQUE IOHEXOL 350 MG/ML SOLN COMPARISON:  Previous MRI from 04/20/2020 FINDINGS: CT HEAD FINDINGS Brain: Age-related cerebral atrophy with chronic small vessel ischemic disease. Chronic ischemic infarcts involving the left parietal lobe and left cerebellum noted. Previously identified patchy small volume infarcts involving the left frontal operculum are grossly stable in size and distribution from previous, better evaluated on prior brain MRI. No evidence for hemorrhagic transformation, mass effect, or other complication. No other acute large vessel territory infarct or hemorrhage. No mass lesion or midline shift. No hydrocephalus or extra-axial fluid collection. Vascular: No hyperdense vessel. Skull: Scalp soft tissues and calvarium within normal limits. Sinuses: Paranasal sinuses and mastoid air cells are clear. Orbits: Globes and orbital soft tissues demonstrate no acute finding. Review of the MIP  images confirms the above findings CTA NECK FINDINGS Aortic arch: Visualized aortic arch normal in caliber. Aberrant right subclavian artery coursing posteriorly to the esophagus noted. No hemodynamically significant stenosis about the origin of the great vessels. Right carotid system: Right common and internal carotid arteries widely patent without stenosis, dissection or occlusion. Left carotid system: Left common and internal carotid arteries widely patent without stenosis, dissection or occlusion. Vertebral arteries: Both vertebral arteries arise from the subclavian arteries. No proximal subclavian artery stenosis. Both vertebral arteries widely patent without stenosis, dissection or occlusion. Skeleton: No visible acute osseous abnormality. No discrete or worrisome osseous lesions. Moderate cervical spondylosis noted at C5-6 and C6-7 without high-grade stenosis. Other neck: No other acute soft tissue abnormality within the neck. 8 mm nodule present at the inferior left thyroid lobe, felt to be of doubtful significance given size and patient age, no follow-up imaging recommended (ref: J Am Coll Radiol. 2015 Feb;12(2): 143-50).No other mass or adenopathy. Esophageal prominence at the level of the aberrant right subclavian artery noted. Upper chest: Visualized upper chest demonstrates no acute finding. Partially visualized lungs are clear. Review of the MIP images confirms the above findings CTA HEAD FINDINGS Anterior circulation: Petrous, cavernous, and supraclinoid segments widely patent without stenosis or other abnormality. A1 segments widely patent. Normal anterior communicating artery complex. Anterior cerebral arteries patent to their distal aspects without stenosis. No M1 stenosis or occlusion. Normal MCA bifurcations. There is age advanced severe atheromatous disease involving the MCA branches bilaterally. Associated multifocal moderate to severe proximal M2 stenoses (series 9, images 112, 114 on the  left, series 9, images 105, 101 on the right. Additional moderate to severe atheromatous changes seen distally within the MCA branches as well, with particular note made of a severe distal  left M3 stenosis (series 9, image 85) as well as a severe distal right M2/M3 stenosis (series 9, image 98). MCA branches remain perfused and grossly symmetric at this time. Posterior circulation: Both V4 segments patent to the vertebrobasilar junction without stenosis. Both PICA origins patent and normal. Basilar patent to its distal aspect without stenosis. Superior cerebellar arteries patent bilaterally. Both PCAs primarily supplied via the basilar. Mild atheromatous irregularity within the P2 segments without high-grade stenosis. Venous sinuses: Grossly patent allowing for timing the contrast bolus. Anatomic variants: None significant.  No aneurysm. Review of the MIP images confirms the above findings IMPRESSION: CT HEAD IMPRESSION: 1. Continued interval evolution in patchy small volume acute left MCA distribution infarcts, grossly stable and better evaluated on recent MRI from 04/20/2020. No evidence for hemorrhagic transformation or other complication. 2. No other new acute intracranial abnormality. 3. 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 4. Underlying moderate to severe chronic microvascular ischemic disease for age. CTA HEAD AND NECK IMPRESSION: 1. Negative CTA for large vessel occlusion. 2. 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 The severely diseased MCA branches remain perfused and grossly symmetric at this time. 3. Otherwise wide patency of the major arterial vasculature of the head and neck. No other proximal high-grade or correctable stenosis. 4. Aberrant right subclavian artery. Electronically Signed   By: Rise Mu M.D.   On: 04/21/2020 04:12   MR ANGIO HEAD WO  CONTRAST  Result Date: 04/20/2020 CLINICAL DATA:  Acute neuro deficit.  Aphasia EXAM: MRI HEAD WITHOUT CONTRAST MRA HEAD WITHOUT CONTRAST TECHNIQUE: Multiplanar, multiecho pulse sequences of the brain and surrounding structures were obtained without intravenous contrast. Angiographic images of the head were obtained using MRA technique without contrast. COMPARISON:  CT head 04/20/2020 FINDINGS: MRI HEAD FINDINGS Brain: Acute infarct in the left middle frontal lobe extending to the operculum. Multiple small areas of infarct are present in the white matter and cortex in this area. No other acute infarct 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 Patchy white matter hyperintensity bilaterally compatible chronic microvascular ischemia. Negative for hemorrhage, mass, or hydrocephalus. Vascular: Normal arterial flow voids. Skull and upper cervical spine: Negative Sinuses/Orbits: Negative Other: None MRA HEAD FINDINGS Tortuosity of the intracranial circulation causes loss of signal particularly in the middle cerebral arteries bilaterally. This is symmetric and is felt to be artifact. Similar findings are present in the proximal anterior cerebral arteries bilaterally. No intracranial stenosis or large vessel occlusion.  No aneurysm. IMPRESSION: Acute infarct left middle frontal lobe without hemorrhage. Chronic infarct left cerebellum, left parietal lobe and moderate chronic microvascular ischemic change in the white matter Tortuous intracranial circulation causing artifact in the circle Willis. Allowing for this, no intracranial stenosis or large vessel occlusion is identified. Electronically Signed   By: Marlan Palau M.D.   On: 04/20/2020 17:37   MR BRAIN WO CONTRAST  Result Date: 04/20/2020 CLINICAL DATA:  Acute neuro deficit.  Aphasia EXAM: MRI HEAD WITHOUT CONTRAST MRA HEAD WITHOUT CONTRAST TECHNIQUE: Multiplanar, multiecho pulse sequences of the brain and surrounding structures were  obtained without intravenous contrast. Angiographic images of the head were obtained using MRA technique without contrast. COMPARISON:  CT head 04/20/2020 FINDINGS: MRI HEAD FINDINGS Brain: Acute infarct in the left middle frontal lobe extending to the operculum. Multiple small areas of infarct are present in the white matter and cortex in this area. No other acute infarct Chronic  infarct left cerebellum. Chronic infarct left parietal lobe. Patchy white matter hyperintensity bilaterally compatible chronic microvascular ischemia. Negative for hemorrhage, mass, or hydrocephalus. Vascular: Normal arterial flow voids. Skull and upper cervical spine: Negative Sinuses/Orbits: Negative Other: None MRA HEAD FINDINGS Tortuosity of the intracranial circulation causes loss of signal particularly in the middle cerebral arteries bilaterally. This is symmetric and is felt to be artifact. Similar findings are present in the proximal anterior cerebral arteries bilaterally. No intracranial stenosis or large vessel occlusion.  No aneurysm. IMPRESSION: Acute infarct left middle frontal lobe without hemorrhage. Chronic infarct left cerebellum, left parietal lobe and moderate chronic microvascular ischemic change in the white matter Tortuous intracranial circulation causing artifact in the circle Willis. Allowing for this, no intracranial stenosis or large vessel occlusion is identified. Electronically Signed   By: Marlan Palauharles  Clark M.D.   On: 04/20/2020 17:37   PHYSICAL EXAM Constitutional: Appears well-developed and well-nourished.  Psych: Affect appropriate to situation, mildly anxious and frustrated with speaking attempts.   Eyes: No scleral injection HENT: No oropharyngeal obstruction.  MSK: no joint deformities.  Cardiovascular: Perfusing extremities well, regular rate Respiratory: Effort normal, non-labored breathing GI: Soft.  No distension. There is no tenderness.  Skin: Warm dry and intact visible  skin  Neuro: Mental Status: Patient is awake, alert, oriented to place, month but not year by choice. Attempts to write responses were not appropriate. For the year he wrote "5".  Reliably shakes his head yes/no to most questions.  ccasionally have some difficulty following a command or is slow to process the command but overall correctly perform all tasks requested. He names 2/3 object with comprehensible but dysarthric speech. Unable to repeat 3 word phrase.  No evidence of neglect Cranial Nerves: II: Visual Fields Full Pupils are equal, round, and reactive to light 5 to 3 mm bilaterally III,IV, VI: EOMI intact V: Facial sensation is symmetric to light touch V1-V3 VII: Facial movement is symmetric.  VIII: hearing is intact to voice X: Uvula elevates symmetrically XI: Head turn is symmetric. XII: tongue is midline without atrophy or fasciculations.  Motor: Bulk is normal. 5/5 strength was present in all four extremities other than left hip flexor weakness 4/5 which she reports is his baseline Sensory: Sensation is symmetric to light touch and temperature in the arms and legs.   Cerebellar: FNF and HKS are intact bilaterally   ASSESSMENT/PLAN Nathaniel Spears is a 45 y.o. male with history of smoking, HTN, obesity with new DM2, cocaine abuse, who presented to the ED with slurring words x2 days, biting tongue x 2 days, blurred vision of uncertain duration, left leg weakness with walking difficulty x 1 week. Code stroke work up undertaken.   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  Head CT multiple remote infarcts and significant chronic microvascular changes  MRI left MCA distribution patchy strokes  MRA bilateral narrowing of the middle  cerebral arteries  CTA head & neck - Severe atheromatous disease involving the distal MCA branches bilaterally with associated moderate to severe multifocal bilateral M2 and M3 stenoses. The severely diseased MCA branches remain perfused and grossly symmetric at this time.  2D Echo EF 60-65% +regional wall motion abnormalities, Moderate LVH   LDL 53  HgbA1c 11.1  VTE prophylaxis - Lovenox    No antiplatelet/anticoagulatn prior  to admission, now on ASA 325mg  and Plavix 75mg  x 3 months and then ASA alone.  Therapy recommendations:  CIR, pending progress per PM&R team  Disposition:  pending  Multifocal intracranial stenosis  MRA bilateral narrowing of the middle cerebral arteries  CTA head & neck - Severe atheromatous disease involving the distal MCA branches bilaterally with associated moderate to severe multifocal bilateral M2 and M3 stenoses. The severely diseased MCA branches remain perfused and grossly symmetric at this time.  The current left MCA infarct likely related to left MCA branch stenosis  On DAPT  Hypertension  Stable . Avoid low BP . Long-term BP goal 130-150 given severe intracranial stenosis  Hyperlipidemia  Home meds: None  LDL 53, at goal < 70  Lipitor 40mg  added   Continue statin at discharge  Diabetes type II Uncontrolled  New diagnosis   HgbA1c 11.1, goal < 7.0  CBGs  SSI  Management per primary team.   DM education appreciated  Will need to establish with PCP if not already on board  and have close follow up.  Tobacco abuse  Current smoker  Smoking cessation counseling provided  Nicotine patch provided  Pt is willing to quit  Cocaine abuse   UDS positive for cocaine  Cessation education provided  Pt is willing to quit  Other Stroke Risk Factors  Advanced Age >/= 65   ETOH use, alcohol level <10, advised to drink no more than 1 drink(s) a day  Obesity, Body mass index is 32.95 kg/m., BMI >/= 30 associated with  increased stroke risk, recommend weight loss, diet and exercise as appropriate   Hx stroke/TIA, chronic old left cerebellar and left parietooccipital on imaging   Other Active Problems  Hypokalemia - K 3.1- supplement   Hospital day # 0 This plan of care was directed by Dr. .  , NP-C   ATTENDING NOTE: I reviewed above note and agree with the assessment and plan. Pt was seen and examined.   45 yo M with Hx of HTN, smoker admitted for aphasia.  For the last 1 to 2 months, patient had intermittent symptoms of headache, dizziness and difficulty walking.  CT head showed chronic left cerebellum and left parieto-occipital infarcts.  MRI showed left MCA patchy infarcts.  MRA showed bilateral narrowing of MCA branches, which confirmed by CTA head and neck with multifocal bilateral MCA branches high-grade stenosis.  EF 60 to 65%, LDL 53, A1c 11.1.  UDS showed positive cocaine.  Creatinine 0.90.  Diabetes diagnosis is new, currently diabetic educator is working with him for diabetic diet and home management.  On exam, patient sitting in chair, girlfriend at bedside, patient eyes open, awake alert, orientated to self, but difficulty answer questions of place, time or people.  Intermittent become tearful.  However, patient was able to name and repeat with intermittent paraphasic errors.  Decreased spontaneous speech, however follow simple commands.  Visual field full, no gaze palsy, right mild facial droop.  Bilateral upper and lower extremity equal strengths, no ataxia, sensation symmetrical.  Etiology for patient stroke likely due to multifocal intracranial severe stenosis.  The stroke risk factor including smoking, new diagnosis of diabetes, uncontrolled hypertension, cocaine use and obesity.  Recommend aspirin 325 and Plavix 75 DAPT for 3 months and then aspirin alone.  Continue Lipitor 40.  Stroke risk factor modification.  Smoking and cocaine cessation education provided.  PT/OT  evaluation.  For detailed assessment and plan, please refer to above as I have made  changes wherever appropriate.   Neurology will sign off. Please call with questions. Pt will follow up with stroke clinic NP at [facility] in about 4 weeks. Thanks for the consult.   Marvel Plan, MD PhD Stroke Neurology 04/21/2020 5:24 PM  I discussed with Dr. Ella Jubilee. I spent  35 minutes in total face-to-face time with the patient, more than 50% of which was spent in counseling and coordination of care, reviewing test results, images and medication, and discussing the diagnosis, treatment plan and potential prognosis. This patient's care requiresreview of multiple databases, neurological assessment, discussion with family, other specialists and medical decision making of high complexity. I had long discussion with girlfriend and patient at bedside, updated pt current condition, treatment plan and potential prognosis, and answered all the questions.  They expressed understanding and [email or web address]          To contact Stroke Continuity provider, please refer to [email or web address]. After hours, contact General Neurology

## 2020-04-21 NOTE — Consult Note (Signed)
Physical Medicine and Rehabilitation Consult Reason for Consult: Dizziness headache with gait disorder and aphasia Referring Physician: Dr.Arrien   HPI: Nathaniel Spears is a 45 y.o. right-handed male with history of hypertension tobacco use as well as alcohol.  History taken from chart review and patient due to aphasia.  Patient lives with his girlfriend, who is available 24/7 at discharge.  Two-level home bed and bath upstairs independent prior to admission.  He presented on 04/20/2020 with aphasia as well as reported headache and dizziness.  CT/MRI showed acute infarct left middle frontal lobe without hemorrhage.  Chronic infarct left cerebellum, left parietal lobe and moderate chronic microvascular ischemic change in the white matter.  MRA with no intracranial stenosis or large vessel occlusion identified.  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  CTA of neck negative.  Admission chemistries unremarkable except potassium 3.3, glucose 267, urine drug screen positive cocaine, hemoglobin A1c 11.1.  Currently maintained on aspirin for CVA prophylaxis.  Subcutaneous Lovenox for DVT prophylaxis.  Insulin therapy initiated for new diagnosis of diabetes mellitus.  Tolerating a regular consistency diet.  Therapy evaluations completed due to patient's aphasia decreased functional ability recommendations of physical medicine rehab consult.   Review of Systems  Constitutional: Negative for chills and fever.  HENT: Negative for hearing loss.   Eyes: Positive for blurred vision. Negative for double vision.  Respiratory: Negative for cough.   Cardiovascular: Negative for chest pain and palpitations.  Gastrointestinal: Negative for heartburn.  Genitourinary: Negative for dysuria, flank pain and hematuria.  Skin: Negative for rash.  Neurological: Positive  for dizziness, speech change and headaches. Negative for sensory change and weakness.  All other systems reviewed and are negative.  Past Medical History:  Diagnosis Date  . Hypertension    Past medical history: Hypertension Family history: Unable to obtain due to patient's aphasia Social History:  reports that he has been smoking. He has never used smokeless tobacco. He reports previous alcohol use. He reports that he does not use drugs. Allergies: No Known Allergies Medications Prior to Admission  Medication Sig Dispense Refill  . amLODipine (NORVASC) 10 MG tablet TAKE 1 TABLET BY MOUTH DAILY. 90 tablet 3  . losartan-hydrochlorothiazide (HYZAAR) 100-25 MG tablet Take 1 tablet by mouth daily.    Marland Kitchen losartan-hydrochlorothiazide (HYZAAR) 100-25 MG tablet TAKE 1 TABLET BY MOUTH DAILY. (Patient not taking: No sig reported) 90 tablet 3    Home: Home Living Family/patient expects to be discharged to:: Private residence Living Arrangements: Spouse/significant other Available Help at Discharge: Family Type of Home: House Home Access: Stairs to enter [employer] of Steps:  (Not entirely clear) Home Layout: Two level,Bed/bath upstairs Alternate Level Stairs-Number of Steps: Flight Additional Comments: impaired communication  Functional History: Prior Function Level of Independence: Independent Functional Status:  Mobility: Bed Mobility Overal bed mobility: Modified Independent General bed mobility comments: Incr time and use of rails Transfers Overall transfer level: Needs assistance Equipment used: 1 person hand held assist Transfers: Sit to/from Stand Sit to Stand: Min assist Stand pivot transfers: Min assist General transfer comment: Min A for balance Ambulation/Gait Ambulation/Gait assistance: Min guard (with and without physical contact) Gait Distance (Feet):  (Progressive amb in hallway) Assistive device: 1 person hand held assist,None Gait Pattern/deviations:  Decreased step length - right,Decreased step length - left,Decreased stride length,Wide base of support General Gait Details: Overall  stiff gait with wide BOS/step width; notably decr trunk movement and decr arm swing noted, especially with RUE    ADL: ADL Overall ADL's : Needs assistance/impaired Eating/Feeding: Minimal assistance,Sitting Eating/Feeding Details (indicate cue type and reason): to open some containers Grooming: Minimal assistance,Sitting,Wash/dry face,Cueing for sequencing Grooming Details (indicate cue type and reason): cues to visually find tools, sequence, remember to turn off water Upper Body Bathing: Minimal assistance Lower Body Bathing: Minimal assistance Upper Body Dressing : Min guard,Sitting Upper Body Dressing Details (indicate cue type and reason): extra time and effort due to apraxic movements Lower Body Dressing: Moderate assistance,Sit to/from stand Lower Body Dressing Details (indicate cue type and reason): extra time and effort due to apraxic movements Toilet Transfer: Minimal assistance,Ambulation Toileting- Clothing Manipulation and Hygiene: Minimal assistance,Sit to/from stand Functional mobility during ADLs: Minimal assistance,Cueing for safety  Cognition: Cognition Overall Cognitive Status: Difficult to assess Orientation Level: Oriented X4 Cognition Arousal/Alertness: Awake/alert Behavior During Therapy: WFL for tasks assessed/performed Overall Cognitive Status: Difficult to assess General Comments: encouraged Pt to use voice/words throughout (tends to want to shake head yes or no) unable to word find when asked open ended questions, but can choose given 2 choices. Difficult to assess due to: Impaired communication  Blood pressure (!) 143/97, pulse 99, temperature 98.8 F (37.1 C), temperature source Oral, resp. rate 16, height  (1.854 m), weight 113.3 kg, SpO2 97 %. Physical Exam Vitals reviewed.  Constitutional:      General: He is  not in acute distress.    Appearance: He is obese. He is not ill-appearing.  HENT:     Head: Normocephalic and atraumatic.     Right Ear: External ear normal.     Left Ear: External ear normal.     Nose: Nose normal.  Eyes:     General:        Right eye: No discharge.        Left eye: No discharge.     Extraocular Movements: Extraocular movements intact.  Cardiovascular:     Rate and Rhythm: Normal rate and regular rhythm.  Pulmonary:     Effort: Pulmonary effort is normal. No respiratory distress.     Breath sounds: No stridor.  Abdominal:     General: Abdomen is flat. Bowel sounds are normal. There is no distension.  Musculoskeletal:     Cervical back: Normal range of motion and neck supple.     Comments: No edema or tenderness in extremities  Skin:    General: Skin is warm and dry.  Neurological:     Mental Status: He is alert.     Comments: Alert Makes eye contact with examiner.   Provides name and age with some delay in processing.  Expressive aphasia  Psychiatric:     Comments: Limited due to aphasia     Results for orders placed or performed during the hospital encounter of 04/20/20 (from the past 24 hour(s))  Urine rapid drug screen (hosp performed)not at [facility]     Status: Abnormal   Collection Time: 04/20/20  7:39 PM  Result Value Ref Range   Opiates NONE DETECTED NONE DETECTED   Cocaine POSITIVE (A) NONE DETECTED   Benzodiazepines NONE DETECTED NONE DETECTED   Amphetamines NONE DETECTED NONE DETECTED   Tetrahydrocannabinol NONE DETECTED NONE DETECTED   Barbiturates NONE DETECTED NONE DETECTED  Urinalysis, Routine w reflex microscopic     Status: Abnormal   Collection Time: 04/20/20  7:39 PM  Result Value Ref Range  Color, Urine YELLOW YELLOW   APPearance CLEAR CLEAR   Specific Gravity, Urine 1.026 1.005 - 1.030   pH 6.0 5.0 - 8.0   Glucose, UA >=500 (A) NEGATIVE mg/dL   Hgb urine dipstick NEGATIVE NEGATIVE   Bilirubin Urine NEGATIVE NEGATIVE   Ketones,  ur 80 (A) NEGATIVE mg/dL   Protein, ur 30 (A) NEGATIVE mg/dL   Nitrite NEGATIVE NEGATIVE   Leukocytes,Ua NEGATIVE NEGATIVE   RBC / HPF 0-5 0 - 5 RBC/hpf   WBC, UA 0-5 0 - 5 WBC/hpf   Bacteria, UA NONE SEEN NONE SEEN   Mucus PRESENT   Ammonia     Status: None   Collection Time: 04/20/20 10:47 PM  Result Value Ref Range   Ammonia 20 9 - 35 umol/L  Magnesium     Status: None   Collection Time: 04/20/20 10:47 PM  Result Value Ref Range   Magnesium 1.8 1.7 - 2.4 mg/dL  HIV Antibody (routine testing w rflx)     Status: None   Collection Time: 04/20/20 10:47 PM  Result Value Ref Range   HIV Screen 4th Generation wRfx Non Reactive Non Reactive  Lipid panel     Status: Abnormal   Collection Time: 04/20/20 10:47 PM  Result Value Ref Range   Cholesterol 103 0 - 200 mg/dL   Triglycerides 035 <009 mg/dL   HDL 26 (L) >38 mg/dL   Total CHOL/HDL Ratio 4.0 RATIO   VLDL 24 0 - 40 mg/dL   LDL Cholesterol 53 0 - 99 mg/dL  Basic metabolic panel     Status: Abnormal   Collection Time: 04/20/20 10:47 PM  Result Value Ref Range   Sodium 135 135 - 145 mmol/L   Potassium 3.1 (L) 3.5 - 5.1 mmol/L   Chloride 102 98 - 111 mmol/L   CO2 25 22 - 32 mmol/L   Glucose, Bld 195 (H) 70 - 99 mg/dL   BUN 9 6 - 20 mg/dL   Creatinine, Ser 1.82 0.61 - 1.24 mg/dL   Calcium 8.5 (L) 8.9 - 10.3 mg/dL   GFR, Estimated >99 >37 mL/min   Anion gap 8 5 - 15  CBC     Status: None   Collection Time: 04/20/20 10:47 PM  Result Value Ref Range   WBC 8.0 4.0 - 10.5 K/uL   RBC 5.51 4.22 - 5.81 MIL/uL   Hemoglobin 14.5 13.0 - 17.0 g/dL   HCT 16.9 67.8 - 93.8 %   MCV 80.6 80.0 - 100.0 fL   MCH 26.3 26.0 - 34.0 pg   MCHC 32.7 30.0 - 36.0 g/dL   RDW 10.1 75.1 - 02.5 %   Platelets 179 150 - 400 K/uL   nRBC 0.0 0.0 - 0.2 %  Hemoglobin A1c     Status: Abnormal   Collection Time: 04/20/20 10:47 PM  Result Value Ref Range   Hgb A1c MFr Bld 11.1 (H) 4.8 - 5.6 %   Mean Plasma Glucose 271.87 mg/dL  Glucose, capillary      Status: Abnormal   Collection Time: 04/21/20 12:49 AM  Result Value Ref Range   Glucose-Capillary 190 (H) 70 - 99 mg/dL  Glucose, capillary     Status: Abnormal   Collection Time: 04/21/20  6:15 AM  Result Value Ref Range   Glucose-Capillary 180 (H) 70 - 99 mg/dL  Basic metabolic panel     Status: Abnormal   Collection Time: 04/21/20  8:42 AM  Result Value Ref Range   Sodium 138 135 -  145 mmol/L   Potassium 3.5 3.5 - 5.1 mmol/L   Chloride 103 98 - 111 mmol/L   CO2 26 22 - 32 mmol/L   Glucose, Bld 197 (H) 70 - 99 mg/dL   BUN 6 6 - 20 mg/dL   Creatinine, Ser 1.61 0.61 - 1.24 mg/dL   Calcium 8.4 (L) 8.9 - 10.3 mg/dL   GFR, Estimated >09 >60 mL/min   Anion gap 9 5 - 15  Magnesium     Status: None   Collection Time: 04/21/20  8:42 AM  Result Value Ref Range   Magnesium 1.9 1.7 - 2.4 mg/dL  Sedimentation rate     Status: None   Collection Time: 04/21/20  8:42 AM  Result Value Ref Range   Sed Rate 3 0 - 16 mm/hr  TSH     Status: None   Collection Time: 04/21/20  8:42 AM  Result Value Ref Range   TSH 1.156 0.350 - 4.500 uIU/mL  Glucose, capillary     Status: Abnormal   Collection Time: 04/21/20 11:13 AM  Result Value Ref Range   Glucose-Capillary 290 (H) 70 - 99 mg/dL   Comment 1 Notify RN    CT ANGIO HEAD W OR WO CONTRAST  Result Date: 04/21/2020 CLINICAL DATA:  Follow-up examination for acute stroke. EXAM: CT ANGIOGRAPHY HEAD AND NECK TECHNIQUE: Multidetector CT imaging of the head and neck was performed using the standard protocol during bolus administration of intravenous contrast. Multiplanar CT image reconstructions and MIPs were obtained to evaluate the vascular anatomy. Carotid stenosis measurements (when applicable) are obtained utilizing NASCET criteria, using the distal internal carotid diameter as the denominator. CONTRAST:  80mL OMNIPAQUE IOHEXOL 350 MG/ML SOLN COMPARISON:  Previous MRI from 04/20/2020 FINDINGS: CT HEAD FINDINGS Brain: Age-related cerebral atrophy with  chronic small vessel ischemic disease. Chronic ischemic infarcts involving the left parietal lobe and left cerebellum noted. Previously identified patchy small volume infarcts involving the left frontal operculum are grossly stable in size and distribution from previous, better evaluated on prior brain MRI. No evidence for hemorrhagic transformation, mass effect, or other complication. No other acute large vessel territory infarct or hemorrhage. No mass lesion or midline shift. No hydrocephalus or extra-axial fluid collection. Vascular: No hyperdense vessel. Skull: Scalp soft tissues and calvarium within normal limits. Sinuses: Paranasal sinuses and mastoid air cells are clear. Orbits: Globes and orbital soft tissues demonstrate no acute finding. Review of the MIP images confirms the above findings CTA NECK FINDINGS Aortic arch: Visualized aortic arch normal in caliber. Aberrant right subclavian artery coursing posteriorly to the esophagus noted. No hemodynamically significant stenosis about the origin of the great vessels. Right carotid system: Right common and internal carotid arteries widely patent without stenosis, dissection or occlusion. Left carotid system: Left common and internal carotid arteries widely patent without stenosis, dissection or occlusion. Vertebral arteries: Both vertebral arteries arise from the subclavian arteries. No proximal subclavian artery stenosis. Both vertebral arteries widely patent without stenosis, dissection or occlusion. Skeleton: No visible acute osseous abnormality. No discrete or worrisome osseous lesions. Moderate cervical spondylosis noted at C5-6 and C6-7 without high-grade stenosis. Other neck: No other acute soft tissue abnormality within the neck. 8 mm nodule present at the inferior left thyroid lobe, felt to be of doubtful significance given size and patient age, no follow-up imaging recommended (ref: J Am Coll Radiol. 2015 Feb;12(2): 143-50).No other mass or  adenopathy. Esophageal prominence at the level of the aberrant right subclavian artery noted. Upper chest: Visualized upper chest  demonstrates no acute finding. Partially visualized lungs are clear. Review of the MIP images confirms the above findings CTA HEAD FINDINGS Anterior circulation: Petrous, cavernous, and supraclinoid segments widely patent without stenosis or other abnormality. A1 segments widely patent. Normal anterior communicating artery complex. Anterior cerebral arteries patent to their distal aspects without stenosis. No M1 stenosis or occlusion. Normal MCA bifurcations. There is age advanced severe atheromatous disease involving the MCA branches bilaterally. Associated multifocal moderate to severe proximal M2 stenoses (series 9, images 112, 114 on the left, series 9, images 105, 101 on the right. Additional moderate to severe atheromatous changes seen distally within the MCA branches as well, with particular note made of a severe distal left M3 stenosis (series 9, image 85) as well as a severe distal right M2/M3 stenosis (series 9, image 98). MCA branches remain perfused and grossly symmetric at this time. Posterior circulation: Both V4 segments patent to the vertebrobasilar junction without stenosis. Both PICA origins patent and normal. Basilar patent to its distal aspect without stenosis. Superior cerebellar arteries patent bilaterally. Both PCAs primarily supplied via the basilar. Mild atheromatous irregularity within the P2 segments without high-grade stenosis. Venous sinuses: Grossly patent allowing for timing the contrast bolus. Anatomic variants: None significant.  No aneurysm. Review of the MIP images confirms the above findings IMPRESSION: CT HEAD IMPRESSION: 1. Continued interval evolution in patchy small volume acute left MCA distribution infarcts, grossly stable and better evaluated on recent MRI from 04/20/2020. No evidence for hemorrhagic transformation or other complication. 2. No  other new acute intracranial abnormality. 3. 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 4. Underlying moderate to severe chronic microvascular ischemic disease for age. CTA HEAD AND NECK IMPRESSION: 1. Negative CTA for large vessel occlusion. 2. 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 The severely diseased MCA branches remain perfused and grossly symmetric at this time. 3. Otherwise wide patency of the major arterial vasculature of the head and neck. No other proximal high-grade or correctable stenosis. 4. Aberrant right subclavian artery. Electronically Signed   By: Rise MuBenjamin  McClintock M.D.   On: 04/21/2020 04:12   CT HEAD WO CONTRAST  Result Date: 04/20/2020 CLINICAL DATA:  Aphasia, left-sided facial droop starting yesterday. EXAM: CT HEAD WITHOUT CONTRAST TECHNIQUE: Contiguous axial images were obtained from the base of the skull through the vertex without intravenous contrast. COMPARISON:  02/10/2020 FINDINGS: Brain: Remote infarcts of the left cerebellum and left parietal lobe. Periventricular white matter and corona radiata hypodensities favor chronic ischemic microvascular white matter disease. Otherwise, the brainstem, cerebellum, cerebral peduncles, thalamus, basal ganglia, basilar cisterns, and ventricular system appear within normal limits. No intracranial hemorrhage, mass lesion, or acute CVA. Vascular: Unremarkable Skull: Unremarkable Sinuses/Orbits: Suspected mucosal swelling in the nasal cavity. Otherwise unremarkable. Other: No supplemental non-categorized findings. IMPRESSION: 1. Remote infarcts of the left cerebellum and left parietal lobe. Periventricular white matter and corona radiata hypodensities favor chronic ischemic microvascular white matter disease. 2. No acute intracranial findings. Electronically Signed   By: Gaylyn RongWalter  Liebkemann M.D.   On: 04/20/2020 14:48    CT ANGIO NECK W OR WO CONTRAST  Result Date: 04/21/2020 CLINICAL DATA:  Follow-up examination for acute stroke. EXAM: CT ANGIOGRAPHY HEAD AND NECK TECHNIQUE: Multidetector CT imaging of the head and neck was performed using the standard protocol during bolus administration of intravenous contrast. Multiplanar CT image reconstructions and MIPs were obtained to evaluate the vascular anatomy. Carotid stenosis measurements (  when applicable) are obtained utilizing NASCET criteria, using the distal internal carotid diameter as the denominator. CONTRAST:  80mL OMNIPAQUE IOHEXOL 350 MG/ML SOLN COMPARISON:  Previous MRI from 04/20/2020 FINDINGS: CT HEAD FINDINGS Brain: Age-related cerebral atrophy with chronic small vessel ischemic disease. Chronic ischemic infarcts involving the left parietal lobe and left cerebellum noted. Previously identified patchy small volume infarcts involving the left frontal operculum are grossly stable in size and distribution from previous, better evaluated on prior brain MRI. No evidence for hemorrhagic transformation, mass effect, or other complication. No other acute large vessel territory infarct or hemorrhage. No mass lesion or midline shift. No hydrocephalus or extra-axial fluid collection. Vascular: No hyperdense vessel. Skull: Scalp soft tissues and calvarium within normal limits. Sinuses: Paranasal sinuses and mastoid air cells are clear. Orbits: Globes and orbital soft tissues demonstrate no acute finding. Review of the MIP images confirms the above findings CTA NECK FINDINGS Aortic arch: Visualized aortic arch normal in caliber. Aberrant right subclavian artery coursing posteriorly to the esophagus noted. No hemodynamically significant stenosis about the origin of the great vessels. Right carotid system: Right common and internal carotid arteries widely patent without stenosis, dissection or occlusion. Left carotid system: Left common and internal carotid arteries widely patent  without stenosis, dissection or occlusion. Vertebral arteries: Both vertebral arteries arise from the subclavian arteries. No proximal subclavian artery stenosis. Both vertebral arteries widely patent without stenosis, dissection or occlusion. Skeleton: No visible acute osseous abnormality. No discrete or worrisome osseous lesions. Moderate cervical spondylosis noted at C5-6 and C6-7 without high-grade stenosis. Other neck: No other acute soft tissue abnormality within the neck. 8 mm nodule present at the inferior left thyroid lobe, felt to be of doubtful significance given size and patient age, no follow-up imaging recommended (ref: J Am Coll Radiol. 2015 Feb;12(2): 143-50).No other mass or adenopathy. Esophageal prominence at the level of the aberrant right subclavian artery noted. Upper chest: Visualized upper chest demonstrates no acute finding. Partially visualized lungs are clear. Review of the MIP images confirms the above findings CTA HEAD FINDINGS Anterior circulation: Petrous, cavernous, and supraclinoid segments widely patent without stenosis or other abnormality. A1 segments widely patent. Normal anterior communicating artery complex. Anterior cerebral arteries patent to their distal aspects without stenosis. No M1 stenosis or occlusion. Normal MCA bifurcations. There is age advanced severe atheromatous disease involving the MCA branches bilaterally. Associated multifocal moderate to severe proximal M2 stenoses (series 9, images 112, 114 on the left, series 9, images 105, 101 on the right. Additional moderate to severe atheromatous changes seen distally within the MCA branches as well, with particular note made of a severe distal left M3 stenosis (series 9, image 85) as well as a severe distal right M2/M3 stenosis (series 9, image 98). MCA branches remain perfused and grossly symmetric at this time. Posterior circulation: Both V4 segments patent to the vertebrobasilar junction without stenosis. Both PICA  origins patent and normal. Basilar patent to its distal aspect without stenosis. Superior cerebellar arteries patent bilaterally. Both PCAs primarily supplied via the basilar. Mild atheromatous irregularity within the P2 segments without high-grade stenosis. Venous sinuses: Grossly patent allowing for timing the contrast bolus. Anatomic variants: None significant.  No aneurysm. Review of the MIP images confirms the above findings IMPRESSION: CT HEAD IMPRESSION: 1. Continued interval evolution in patchy small volume acute left MCA distribution infarcts, grossly stable and better evaluated on recent MRI from 04/20/2020. No evidence for hemorrhagic transformation or other complication. 2. No other new acute intracranial abnormality. 3.  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 4. Underlying moderate to severe chronic microvascular ischemic disease for age. CTA HEAD AND NECK IMPRESSION: 1. Negative CTA for large vessel occlusion. 2. 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 The severely diseased MCA branches remain perfused and grossly symmetric at this time. 3. Otherwise wide patency of the major arterial vasculature of the head and neck. No other proximal high-grade or correctable stenosis. 4. Aberrant right subclavian artery. Electronically Signed   By: Rise Mu M.D.   On: 04/21/2020 04:12   MR ANGIO HEAD WO CONTRAST  Result Date: 04/20/2020 CLINICAL DATA:  Acute neuro deficit.  Aphasia EXAM: MRI HEAD WITHOUT CONTRAST MRA HEAD WITHOUT CONTRAST TECHNIQUE: Multiplanar, multiecho pulse sequences of the brain and surrounding structures were obtained without intravenous contrast. Angiographic images of the head were obtained using MRA technique without contrast. COMPARISON:  CT head 04/20/2020 FINDINGS: MRI HEAD FINDINGS Brain: Acute infarct in the left middle frontal lobe extending  to the operculum. Multiple small areas of infarct are present in the white matter and cortex in this area. No other acute infarct 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 Patchy white matter hyperintensity bilaterally compatible chronic microvascular ischemia. Negative for hemorrhage, mass, or hydrocephalus. Vascular: Normal arterial flow voids. Skull and upper cervical spine: Negative Sinuses/Orbits: Negative Other: None MRA HEAD FINDINGS Tortuosity of the intracranial circulation causes loss of signal particularly in the middle cerebral arteries bilaterally. This is symmetric and is felt to be artifact. Similar findings are present in the proximal anterior cerebral arteries bilaterally. No intracranial stenosis or large vessel occlusion.  No aneurysm. IMPRESSION: Acute infarct left middle frontal lobe without hemorrhage. Chronic infarct left cerebellum, left parietal lobe and moderate chronic microvascular ischemic change in the white matter Tortuous intracranial circulation causing artifact in the circle Willis. Allowing for this, no intracranial stenosis or large vessel occlusion is identified. Electronically Signed   By: Marlan Palau M.D.   On: 04/20/2020 17:37   MR BRAIN WO CONTRAST  Result Date: 04/20/2020 CLINICAL DATA:  Acute neuro deficit.  Aphasia EXAM: MRI HEAD WITHOUT CONTRAST MRA HEAD WITHOUT CONTRAST TECHNIQUE: Multiplanar, multiecho pulse sequences of the brain and surrounding structures were obtained without intravenous contrast. Angiographic images of the head were obtained using MRA technique without contrast. COMPARISON:  CT head 04/20/2020 FINDINGS: MRI HEAD FINDINGS Brain: Acute infarct in the left middle frontal lobe extending to the operculum. Multiple small areas of infarct are present in the white matter and cortex in this area. No other acute infarct 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 Patchy white matter hyperintensity  bilaterally compatible chronic microvascular ischemia. Negative for hemorrhage, mass, or hydrocephalus. Vascular: Normal arterial flow voids. Skull and upper cervical spine: Negative Sinuses/Orbits: Negative Other: None MRA HEAD FINDINGS Tortuosity of the intracranial circulation causes loss of signal particularly in the middle cerebral arteries bilaterally. This is symmetric and is felt to be artifact. Similar findings are present in the proximal anterior cerebral arteries bilaterally. No intracranial stenosis or large vessel occlusion.  No aneurysm. IMPRESSION: Acute infarct left middle frontal lobe without hemorrhage. Chronic infarct left cerebellum, left parietal lobe and moderate chronic microvascular ischemic change in the white matter Tortuous intracranial circulation causing artifact in the circle Willis. Allowing for this, no intracranial stenosis or large vessel occlusion is identified. Electronically Signed   By: Marlan Palau M.D.   On: 04/20/2020  17:37    Assessment/Plan: Diagnosis: Left MCA infarct Stroke: Continue secondary stroke prophylaxis and Risk Factor Modification listed below:   Antiplatelet therapy:   Blood Pressure Management:  Continue current medication with prn's with permisive HTN per primary team Statin Agent:  2 Diabetes management:   Tobacco abuse:   PT/OT for mobility, ADL training  Labs independently reviewed.  Records reviewed and summated above.  1. Does the need for close, 24 hr/day medical supervision in concert with the patient's rehab needs make it unreasonable for this patient to be served in a less intensive setting? Potentially  2. Co-Morbidities requiring supervision/potential complications: HTN (monitor and provide prns in accordance with increased physical exertion and pain), tobacco, alcohol, cocaine abuse (NicoDerm, counsel), new diagnosis DM (Monitor in accordance with exercise and adjust meds as necessary), hypokalemia (continue to monitor and  replete as necessary) 3. Due to safety, disease management and patient education, does the patient require 24 hr/day rehab nursing? Potentially 4. Does the patient require coordinated care of a physician, rehab nurse, therapy disciplines of PT/OT/SLP to address physical and functional deficits in the context of the above medical diagnosis(es)? Potentially Addressing deficits in the following areas: balance, endurance, locomotion, strength, transferring, bathing, dressing, toileting, speech, language and psychosocial support 5. Can the patient actively participate in an intensive therapy program of at least 3 hrs of therapy per day at least 5 days per week? Yes 6. The potential for patient to make measurable gains while on inpatient rehab is good 7. Anticipated functional outcomes upon discharge from inpatient rehab are modified independent  with PT, modified independent with OT, modified independent with SLP. 8. Estimated rehab length of stay to reach the above functional goals is: 4-7 days. 9. Anticipated discharge destination: Home 10. Overall Rehab/Functional Prognosis: good  RECOMMENDATIONS: This patient's condition is appropriate for continued rehabilitative care in the following setting: Patient doing well on day of evaluation, likely will not require CIR.  We will continue to follow along for progress. Patient has agreed to participate in recommended program. Yes Note that insurance prior authorization may be required for reimbursement for recommended care.  Comment: Rehab Admissions Coordinator to follow up.  I have personally performed a face to face diagnostic evaluation, including, but not limited to relevant history and physical exam findings, of this patient and developed relevant assessment and plan.  Additionally, I have reviewed and concur with the physician assistant's documentation above.   Maryla Morrow, MD, ABPMR Mcarthur Rossetti Angiulli, PA-C 04/21/2020

## 2020-04-21 NOTE — Progress Notes (Signed)
  Echocardiogram 2D Echocardiogram has been performed with Definity.  Gerda Diss 04/21/2020, 2:36 PM

## 2020-04-21 NOTE — Progress Notes (Addendum)
PROGRESS NOTE    Nathaniel Spears  TIW:[id number] DOB: [birth year] DOA: 04/20/2020 PCP: Grayce Sessions, NP    Brief Narrative:  Nathaniel Spears was admitted with the working diagnosis of acute ischemic infarct left middle frontal lobe.   45 year old male with significant past medical history for hypertension and tobacco abuse who presented with aphasia.  Reported at his baseline mental suddenly developed aphasia about 12 hours prior to hospitalization.  Because of persistent symptoms he came to the hospital for further evaluation.  His blood pressure was 167/105, heart rate 88, respiratory rate 23, temperature 98.8, oxygenation 100% on room air.  His lungs were clear to auscultation bilaterally, heart S1-S2, present rhythmic, soft abdomen, no lower extremity edema.  Patient had significant expiration aphasia strength was preserved all 4 extremities.  Sodium 139, potassium 3.3, chloride 99, bicarb 28, glucose 267, BUN 11, creatinine 1.1, white count 7.9, hemoglobin 15.2, hematocrit 46.2, platelets 206. SARS COVID-19 negative. Urine analysis specific gravity 1.026, negative for infection, > 500 glucose. Toxicology positive for cocaine.  Head CT with remote infarcts left cerebellum and left parietal lobe. Brain MRI with acute infarct left middle frontal lobe without hemorrhage.  EKG 89 bpm, normal axis, normal intervals, sinus rhythm, no significant ST segment changes, negative T waves in lead I-aVL.  Assessment & Plan:   Principal Problem:   Acute CVA (cerebrovascular accident) [facility]) Active Problems:   Essential hypertension   CVA (cerebral vascular accident) (HCC)   1. 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 Patient is feeling better but not yet at his baseline, continue to have aphasia and difficulty ambulating.   CTA with no large vessel occlusion.  MRA with no intracranial stenosis or large vessel occlusion.  Echocardiogram with preserved LV systolic function 60 to 65% with  moderate LVH,   Continue dual antiplatelet therapy with aspirin 325 mg and clopidogrel, and add high dose, high potency stain. Blood pressure/ diabetic control, avoid cocaine  Case discussed with Dr Roda Shutters from neurology. LDL 53, HDL 26, Cholesterol 103, triglycerides 118.  Follow up with recommendations from PT and OT for inpatient rehab.   2. Uncontrolled HTN (hypertensive emergency). Blood pressure 155/103 to 143/97 mmHg.   Continue with permissive hypertension for the next 24 hrs, before starting oral antihypertensive regimen.   3. Hypokalemia  4. Tobacco and cocaine use. Continue smoking cessation, advice to avoid cocaine.   5. T2DM uncontrolled with HgbA1c 11.1. Fasting glucose this am is 197, continue glucose cover and monitoring with insulin sliding scale.    Status is: Inpatient  Remains inpatient appropriate because:Inpatient level of care appropriate due to severity of illness   Dispo: The patient is from: Home              Anticipated d/c is to: CIR              Patient currently is not medically stable to d/c.   Difficult to place patient No   DVT prophylaxis: Enoxaparin   Code Status:   full  Family Communication:  No family at the bedside       Consultants:   Neurology     Subjective: Patient is feeling better, but not yet back to baseline, no nausea or vomiting, continue to have aphasia and difficulty ambulating.   Objective: Vitals:   04/21/20 0410 04/21/20 0614 04/21/20 0809 04/21/20 1227  BP: (!) 149/103 (!) 153/103 (!) 155/103 (!) 143/97  Pulse: 83 86 84 99  Resp: 20 18 16 16   Temp: 98.7  F (37.1 C) 98.7 F (37.1 C) 98.4 F (36.9 C) 98.8 F (37.1 C)  TempSrc: Oral Oral Oral Oral  SpO2: 98% 94% 95% 97%  Weight:      Height:        Intake/Output Summary (Last 24 hours) at 04/21/2020 1450 Last data filed at 04/21/2020 1129 Gross per 24 hour  Intake 240 ml  Output --  Net 240 ml   Filed Weights   04/20/20 1259 04/20/20 2210  Weight:  113.4 kg 113.3 kg    Examination:   General: Not in pain or dyspnea, deconditioned  Neurology: Awake and alert, positive aphasia, strength is preserved.  E ENT: no pallor, no icterus, oral mucosa moist Cardiovascular: No JVD. S1-S2 present, rhythmic, no gallops, rubs, or murmurs. No lower extremity edema. Pulmonary: positive  breath sounds bilaterally, with no wheezing, rhonchi or rales. Gastrointestinal. Abdomen soft and non tender Skin. No rashes Musculoskeletal: no joint deformities     Data Reviewed: I have personally reviewed following labs and imaging studies  CBC: Recent Labs  Lab 04/20/20 1321 04/20/20 2247  WBC 7.9 8.0  NEUTROABS 4.8  --   HGB 15.2 14.5  HCT 46.2 44.4  MCV 81.8 80.6  PLT 206 179   Basic Metabolic Panel: Recent Labs  Lab 04/20/20 1321 04/20/20 2247 04/21/20 0842  NA 139 135 138  K 3.3* 3.1* 3.5  CL 99 102 103  CO2 28 25 26   GLUCOSE 267* 195* 197*  BUN 11 9 6   CREATININE 1.10 0.86 0.90  CALCIUM 8.8* 8.5* 8.4*  MG  --  1.8 1.9   GFR: Estimated Creatinine Clearance: 136.8 mL/min (by C-G formula based on SCr of 0.9 mg/dL). Liver Function Tests: Recent Labs  Lab 04/20/20 1321  AST 18  ALT 18  ALKPHOS 86  BILITOT 1.2  PROT 7.7  ALBUMIN 4.1   No results for input(s): LIPASE, AMYLASE in the last 168 hours. Recent Labs  Lab 04/20/20 2247  AMMONIA 20   Coagulation Profile: Recent Labs  Lab 04/20/20 1321  INR 1.1   Cardiac Enzymes: No results for input(s): CKTOTAL, CKMB, CKMBINDEX, TROPONINI in the last 168 hours. BNP (last 3 results) No results for input(s): PROBNP in the last 8760 hours. HbA1C: Recent Labs    04/20/20 2247  HGBA1C 11.1*   CBG: Recent Labs  Lab 04/21/20 0049 04/21/20 0615 04/21/20 1113  GLUCAP 190* 180* 290*   Lipid Profile: Recent Labs    04/20/20 2247  CHOL 103  HDL 26*  LDLCALC 53  TRIG 06/21/20  CHOLHDL 4.0   Thyroid Function Tests: Recent Labs    04/21/20 0842  TSH 1.156   Anemia  Panel: No results for input(s): VITAMINB12, FOLATE, FERRITIN, TIBC, IRON, RETICCTPCT in the last 72 hours.    Radiology Studies: I have reviewed all of the imaging during this hospital visit personally     Scheduled Meds: . aspirin EC  325 mg Oral Daily   Or  . aspirin  300 mg Rectal Daily  . enoxaparin (LOVENOX) injection  40 mg Subcutaneous Q24H  . insulin aspart  0-5 Units Subcutaneous QHS  . insulin aspart  0-9 Units Subcutaneous TID WC  . nicotine  21 mg Transdermal Daily   Continuous Infusions:   LOS: 0 days        Ivianna Notch 903, MD

## 2020-04-21 NOTE — Progress Notes (Signed)
Nutrition Education Note  RD consulted for nutrition education regarding new onset diabetes. Spoke with patient and his girlfriend at bedside. She was very appreciative of the information provided. She says that she has type 2 diabetes and will be able to help him with his diet at home. Patient was able to shake his head and answer yes to RD questions. He typically has a good appetite and eats well at home except for the past few days. Since admission he has been eating well. He didn't finish his lunch today because he got a late breakfast, then received a lunch tray shortly thereafter. Nutrition focused physical exam completed.  No muscle or subcutaneous fat depletion noticed.   Lab Results  Component Value Date   HGBA1C 11.1 (H) 04/20/2020    RD provided "Carbohydrate Counting for People with Diabetes" handout from the Academy of [employer] and Dietetics. Discussed different food groups and their effects on blood sugar, emphasizing carbohydrate-containing foods. Provided list of carbohydrates and recommended serving sizes of common foods.  Discussed importance of controlled and consistent carbohydrate intake throughout the day. Provided examples of ways to balance meals/snacks and encouraged intake of high-fiber, whole grain complex carbohydrates. Teach back method used.  Expect good compliance.  Body mass index is 32.95 kg/m. Pt meets criteria for obesity based on current BMI.  Current diet order is heart healthy carbohydrate modified, patient is consuming approximately 100% of meals at this time. Labs and medications reviewed. No further nutrition interventions warranted at this time. RD contact information provided. If additional nutrition issues arise, please re-consult RD.  Gabriel Rainwater, RD, LDN, CNSC Please refer to [facility] for contact information.

## 2020-04-22 LAB — GLUCOSE, CAPILLARY
Glucose-Capillary: 210 mg/dL — ABNORMAL HIGH (ref 70–99)
Glucose-Capillary: 263 mg/dL — ABNORMAL HIGH (ref 70–99)
Glucose-Capillary: 271 mg/dL — ABNORMAL HIGH (ref 70–99)
Glucose-Capillary: 210 mg/dL — ABNORMAL HIGH (ref 70–99)

## 2020-04-22 MED ORDER — LOSARTAN POTASSIUM 50 MG PO TABS
100.0000 mg | ORAL_TABLET | Freq: Every day | ORAL | Status: DC
  Administered 2020-04-22 – 2020-04-23 (×2): 100 mg via ORAL
  Filled 2020-04-22 (×2): qty 2

## 2020-04-22 MED ORDER — METFORMIN HCL 500 MG PO TABS
500.0000 mg | ORAL_TABLET | Freq: Two times a day (BID) | ORAL | Status: DC
  Administered 2020-04-23: 500 mg via ORAL
  Filled 2020-04-22: qty 1

## 2020-04-22 NOTE — Evaluation (Signed)
Speech Language Pathology Evaluation Patient Details Name: Nathaniel Spears MRN: [id number] DOB: 09-Jul-1975 Today's Date: 04/22/2020 Time: 6301-6010 SLP Time Calculation (min) (ACUTE ONLY): 34 min  Problem List:  Patient Active Problem List   Diagnosis Date Noted  . CVA (cerebral vascular accident) (HCC) 04/21/2020  . Aphasia   . Polysubstance abuse (HCC)   . Diabetes mellitus, new onset (HCC)   . Hypokalemia   . Acute CVA (cerebrovascular accident) (HCC) 04/20/2020  . Essential hypertension 08/21/2019  . Obesity 08/21/2019   Past Medical History:  Past Medical History:  Diagnosis Date  . Hypertension    Past Surgical History: History reviewed. No pertinent surgical history. HPI:  This is a 45 year old male with a past medical history significant for hypertension, new diagnosis of diabetes, ongoing tobacco abuse, cocaine abuse, presenting with a left MCA territory stroke.   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  Naming to confrontation for basic objects in room was 60% accurate; 40% required phrase cues or modeling of word.  He was able to state "I'm taking a bath," upon entering the room. He was able to repeat single words but there was phonemic inaccuracy when repeating sentences.  Comprehension of factual information as reflected by yes/no responses was 90% accurate.  He followed multi-step commands with 80% accuracy.  He demonstrated paragraphic errors in writing that mirrored his verbal output.  Given pt's youth and severity of aphasia, pt would benefit from intensive speech therapy; CIR would be preferable.    SLP Assessment  SLP Recommendation/Assessment: Patient needs continued Speech Lanaguage Pathology Services SLP Visit Diagnosis: Aphasia (R47.01)    Follow Up Recommendations  Inpatient Rehab    Frequency and Duration min 2x/week  1 week       SLP Evaluation Cognition  Overall Cognitive Status: Impaired/Different from baseline Arousal/Alertness: Awake/alert Orientation Level: Oriented to person;Oriented to place Attention: Selective       Comprehension  Auditory Comprehension Overall Auditory Comprehension: Impaired Yes/No Questions: Impaired Complex Questions: 75-100% accurate Paragraph Comprehension (via yes/no questions): 51-75% accurate Commands: Within Functional Limits Conversation: Simple Reading Comprehension Reading Status: Not tested    Expression Expression Primary Mode of Expression: Verbal Verbal Expression Overall Verbal Expression: Impaired Initiation: No impairment Automatic Speech: Name;Social Response Level of Generative/Spontaneous Verbalization: Phrase;Word Repetition: Impaired Level of Impairment: Phrase level Naming: Impairment Responsive: 26-50% accurate Confrontation: Impaired Convergent: 50-74% accurate Verbal Errors: Perseveration Pragmatics: No impairment Written Expression Dominant Hand: Left Written Expression: Exceptions to [facility] Dictation Ability: Letter   Oral / Motor  Oral Motor/Sensory Function Overall Oral Motor/Sensory Function: Mild impairment Motor Speech Overall Motor Speech: Impaired Respiration: Within functional limits Resonance: Within functional limits Intelligibility: Intelligible Motor Planning: Impaired   GO                    [name] 04/22/2020, 2:47 PM Juwan Vences L. Samson Frederic, MA CCC/SLP Acute Rehabilitation Services Office number [phone] Pager [phone]

## 2020-04-22 NOTE — Progress Notes (Signed)
Inpatient Rehabilitation Admissions Coordinator     I met at bedside with patient to discuss his potential rehab needs. He may progress to level to not need an inpt rehab admit as per Dr. Serita Grit consult 4/6. I will follow up tomorrow with his progress.  Danne Baxter, RN, MSN Rehab Admissions Coordinator [phone] 04/22/2020 1:26 PM

## 2020-04-22 NOTE — Progress Notes (Signed)
Inpatient Diabetes Program Recommendations  AACE/ADA: New Consensus Statement on Inpatient Glycemic Control (2015)  Target Ranges:  Prepandial:   less than 140 mg/dL      Peak postprandial:   less than 180 mg/dL (1-2 hours)      Critically ill patients:  140 - 180 mg/dL   Lab Results  Component Value Date   GLUCAP 210 (H) 04/22/2020   HGBA1C 11.1 (H) 04/20/2020    Review of Glycemic Control Results for [name], Nathaniel Spears (MRN [id number]) as of 04/22/2020 11:43  Ref. Range 04/21/2020 06:15 04/21/2020 11:13 04/21/2020 16:08 04/21/2020 21:03 04/22/2020 06:09  Glucose-Capillary Latest Ref Range: 70 - 99 mg/dL 299 (H) 371 (H) 696 (H) 200 (H) 210 (H)   Diabetes history: DM 2 new diagnosis Outpatient Diabetes medications:  Current orders for Inpatient glycemic control:  Novolog 0-9 units tid + hs  Inpatient Diabetes Program Recommendations:    -  Metformin 500 mg Daily -  Start Glipizide 5 mg bid  Glucose elevated very low 200 range. May can get by with orals. Start oral meds watch for GI side effects of metformin. Follow glucose trends for now.  Thanks,  Christena Deem RN, MSN, BC-ADM Inpatient Diabetes Coordinator Team Pager [phone] (8a-5p)

## 2020-04-22 NOTE — Progress Notes (Signed)
PROGRESS NOTE    Nathaniel Spears  JSH:[id number] DOB: [birth year] DOA: 04/20/2020 PCP: Grayce Sessions, NP    Brief Narrative:  Nathaniel Spears was admitted with the working diagnosis of acute ischemic infarct left middle frontal lobe.   45 year old male with significant past medical history for hypertension and tobacco abuse who presented with aphasia.  Reported at his baseline mental suddenly developed aphasia about 12 hours prior to hospitalization.  Because of persistent symptoms he came to the hospital for further evaluation.  His blood pressure was 167/105, heart rate 88, respiratory rate 23, temperature 98.8, oxygenation 100% on room air.  His lungs were clear to auscultation bilaterally, heart S1-S2, present rhythmic, soft abdomen, no lower extremity edema.  Patient had significant expiration aphasia strength was preserved all 4 extremities.  Sodium 139, potassium 3.3, chloride 99, bicarb 28, glucose 267, BUN 11, creatinine 1.1, white count 7.9, hemoglobin 15.2, hematocrit 46.2, platelets 206. SARS COVID-19 negative. Urine analysis specific gravity 1.026, negative for infection, > 500 glucose. Toxicology positive for cocaine.  Head CT with remote infarcts left cerebellum and left parietal lobe. Brain MRI with acute infarct left middle frontal lobe without hemorrhage.  EKG 89 bpm, normal axis, normal intervals, sinus rhythm, no significant ST segment changes, negative T waves in lead I-aVL.    Assessment & Plan:   Principal Problem:   Acute CVA (cerebrovascular accident) [facility]) Active Problems:   Essential hypertension   CVA (cerebral vascular accident) (HCC)   Aphasia   Polysubstance abuse (HCC)   Diabetes mellitus, new onset (HCC)   Hypokalemia   1. 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 Aphasia and difficulty ambulating slowly improving but not yet back to baseline.   CTA with no large vessel occlusion.  MRA with no intracranial stenosis or large vessel  occlusion.  Echocardiogram with preserved LV systolic function 60 to 65% with moderate LVH,   Tolerating well dual antiplatelet therapy with aspirin 325 mg and clopidogrel for 3 months and the continue aspirin alone., continue statin therapy. LDL 53, HDL 26, Cholesterol 103, triglycerides 118.  Pending transfer to inpatient rehab.   2. Uncontrolled HTN (hypertensive emergency).  Blood pressure 150/111 - 148/85 - 144/108.  Will resume antihypertensive regimen with losartan today and will add HCTZ in am for further blood pressure control.   3. Hypokalemia. K is 3,5 this am renal function stable with serum cr at 0.90 with bicarbonate at 26.   4. Tobacco and cocaine use. Smoking cessation, advice to avoid cocaine.   5. T2DM uncontrolled with HgbA1c 11.1.  capillary glucose this am 200, 201 and 263.  Continue with insulin sliding scale for glucose cover and monitoring.  Add metformin 500 mg bid.    Status is: Inpatient  Remains inpatient appropriate because:Inpatient level of care appropriate due to severity of illness   Dispo: The patient is from: Home              Anticipated d/c is to: CIR              Patient currently is not medically stable to d/c.   Difficult to place patient No   DVT prophylaxis: Enoxaparin   Code Status:   full  Family Communication:  No family at the bedside      Consultants:   Neurology       Subjective: Patient is feeling better but not yet back to baseline, continue to have aphasia and difficulty moving, no nausea or vomiting, no chest pain or dyspnea.  Objective: Vitals:   04/22/20 0345 04/22/20 0818 04/22/20 0900 04/22/20 1215  BP: (!) 161/99 (!) 150/111 (!) 148/85 (!) 144/108  Pulse: 88 92 94 90  Resp: 18 18  18   Temp: 98.2 F (36.8 C) 98.6 F (37 C)  98.7 F (37.1 C)  TempSrc: Oral Oral  Oral  SpO2: 97% 93% 94% 99%  Weight:      Height:        Intake/Output Summary (Last 24 hours) at 04/22/2020 1454 Last data filed  at 04/21/2020 2000 Gross per 24 hour  Intake 240 ml  Output --  Net 240 ml   Filed Weights   04/20/20 1259 04/20/20 2210  Weight: 113.4 kg 113.3 kg    Examination:   General: Not in pain or dyspnea, deconditioned  Neurology: Awake and alert, non focal  E ENT: mild pallor, no icterus, oral mucosa moist Cardiovascular: No JVD. S1-S2 present, rhythmic, no gallops, rubs, or murmurs. No lower extremity edema. Pulmonary: positive breath sounds bilaterally, with no wheezing, rhonchi or rales. Gastrointestinal. Abdomen soft and non tender Skin. No rashes Musculoskeletal: no joint deformities     Data Reviewed: I have personally reviewed following labs and imaging studies  CBC: Recent Labs  Lab 04/20/20 1321 04/20/20 2247  WBC 7.9 8.0  NEUTROABS 4.8  --   HGB 15.2 14.5  HCT 46.2 44.4  MCV 81.8 80.6  PLT 206 179   Basic Metabolic Panel: Recent Labs  Lab 04/20/20 1321 04/20/20 2247 04/21/20 0842  NA 139 135 138  K 3.3* 3.1* 3.5  CL 99 102 103  CO2 28 25 26   GLUCOSE 267* 195* 197*  BUN 11 9 6   CREATININE 1.10 0.86 0.90  CALCIUM 8.8* 8.5* 8.4*  MG  --  1.8 1.9   GFR: Estimated Creatinine Clearance: 136.8 mL/min (by C-G formula based on SCr of 0.9 mg/dL). Liver Function Tests: Recent Labs  Lab 04/20/20 1321  AST 18  ALT 18  ALKPHOS 86  BILITOT 1.2  PROT 7.7  ALBUMIN 4.1   No results for input(s): LIPASE, AMYLASE in the last 168 hours. Recent Labs  Lab 04/20/20 2247  AMMONIA 20   Coagulation Profile: Recent Labs  Lab 04/20/20 1321  INR 1.1   Cardiac Enzymes: No results for input(s): CKTOTAL, CKMB, CKMBINDEX, TROPONINI in the last 168 hours. BNP (last 3 results) No results for input(s): PROBNP in the last 8760 hours. HbA1C: Recent Labs    04/20/20 2247  HGBA1C 11.1*   CBG: Recent Labs  Lab 04/21/20 1113 04/21/20 1608 04/21/20 2103 04/22/20 0609 04/22/20 1149  GLUCAP 290* 215* 200* 210* 263*   Lipid Profile: Recent Labs     04/20/20 2247  CHOL 103  HDL 26*  LDLCALC 53  TRIG 06/22/20  CHOLHDL 4.0   Thyroid Function Tests: Recent Labs    04/21/20 0842  TSH 1.156   Anemia Panel: No results for input(s): VITAMINB12, FOLATE, FERRITIN, TIBC, IRON, RETICCTPCT in the last 72 hours.    Radiology Studies: I have reviewed all of the imaging during this hospital visit personally     Scheduled Meds: . aspirin EC  325 mg Oral Daily   Or  . aspirin  300 mg Rectal Daily  . atorvastatin  40 mg Oral Daily  . clopidogrel  75 mg Oral Daily  . enoxaparin (LOVENOX) injection  40 mg Subcutaneous Q24H  . insulin aspart  0-5 Units Subcutaneous QHS  . insulin aspart  0-9 Units Subcutaneous TID WC  . nicotine  21 mg Transdermal Daily   Continuous Infusions:   LOS: 1 day        Rayhana Slider Annett Gula, MD

## 2020-04-22 NOTE — Progress Notes (Signed)
Physical Therapy Treatment Patient Details Name: Nathaniel Spears MRN: [id number] DOB: [birth year] Today's Date: 04/22/2020    History of Present Illness This is a 45 year old male with a past medical history significant for hypertension, new diagnosis of diabetes, ongoing tobacco abuse, cocaine abuse, presenting with a left MCA territory stroke.    PT Comments    Pt is doing very well with mobility and balance.  Followed all instructions for balance testing. Do not feel he will need PT after DC.   Follow Up Recommendations  No PT follow up     Equipment Recommendations  None recommended by PT    Recommendations for Other Services       Precautions / Restrictions Precautions Precautions: None    Mobility  Bed Mobility Overal bed mobility: Independent                  Transfers Overall transfer level: Independent   Transfers: Sit to/from Stand Sit to Stand: Independent         General transfer comment: Pt steady to rise for multiple times for sit to stand  Ambulation/Gait Ambulation/Gait assistance: Independent Gait Distance (Feet): 500 Feet Assistive device: None Gait Pattern/deviations: Wide base of support Gait velocity: 4.46 ft/sec when asked to walk fast. 3.13 when walking at self selected pace   General Gait Details: Pt with steady gait without signs of instability. Pt has wide base of support but likely due to body habitus.   Stairs Stairs: Yes Stairs assistance: Supervision Stair Management: Two rails;Alternating pattern;Forwards Number of Stairs: 6 General stair comments: supervision for safety   Wheelchair Mobility    Modified Rankin (Stroke Patients Only)       Balance Overall balance assessment: Independent                               Standardized Balance Assessment Standardized Balance Assessment : Dynamic Gait Index   Dynamic Gait Index Level Surface: Normal Change in Gait Speed: Normal Gait with Horizontal  Head Turns: Normal Gait with Vertical Head Turns: Normal Gait and Pivot Turn: Normal Step Over Obstacle: Mild Impairment Step Around Obstacles: Normal Steps: Mild Impairment Total Score: 22      Cognition Arousal/Alertness: Awake/alert Behavior During Therapy: WFL for tasks assessed/performed Overall Cognitive Status: Difficult to assess                                 General Comments: Pt followed all commands      Exercises      General Comments        Pertinent Vitals/Pain Pain Assessment: No/denies pain    Home Living                      Prior Function            PT Goals (current goals can now be found in the care plan section) Progress towards PT goals: Progressing toward goals    Frequency    Min 3X/week      PT Plan Discharge plan needs to be updated;Frequency needs to be updated    Co-evaluation              AM-PAC PT "6 Clicks" Mobility   Outcome Measure  Help needed turning from your back to your side while in a flat bed without using bedrails?: None Help needed moving from  lying on your back to sitting on the side of a flat bed without using bedrails?: None Help needed moving to and from a bed to a chair (including a wheelchair)?: None Help needed standing up from a chair using your arms (e.g., wheelchair or bedside chair)?: None Help needed to walk in hospital room?: None Help needed climbing 3-5 steps with a railing? : A Little 6 Click Score: 23    End of Session Equipment Utilized During Treatment: Gait belt Activity Tolerance: Patient tolerated treatment well Patient left: with call bell/phone within reach;in bed Nurse Communication: Mobility status PT Visit Diagnosis: Other abnormalities of gait and mobility (R26.89)     Time: 0539-7673 PT Time Calculation (min) (ACUTE ONLY): 20 min  Charges:  $Gait Training: 8-22 mins                     [facility] PT Acute Rehabilitation Services Pager  [phone] Office [phone]    Angelina Ok [facility] 04/22/2020, 5:10 PM

## 2020-04-23 DIAGNOSIS — E119 Type 2 diabetes mellitus without complications: Secondary | ICD-10-CM

## 2020-04-23 LAB — GLUCOSE, CAPILLARY
Glucose-Capillary: 180 mg/dL — ABNORMAL HIGH (ref 70–99)
Glucose-Capillary: 252 mg/dL — ABNORMAL HIGH (ref 70–99)

## 2020-04-23 MED ORDER — ATORVASTATIN CALCIUM 40 MG PO TABS
40.0000 mg | ORAL_TABLET | Freq: Every day | ORAL | 0 refills | Status: DC
  Filled 2020-04-27: qty 30, 30d supply, fill #0

## 2020-04-23 MED ORDER — METFORMIN HCL 500 MG PO TABS
500.0000 mg | ORAL_TABLET | Freq: Two times a day (BID) | ORAL | 0 refills | Status: DC
  Filled 2020-04-27: qty 60, 30d supply, fill #0

## 2020-04-23 MED ORDER — LOSARTAN POTASSIUM-HCTZ 100-25 MG PO TABS
1.0000 | ORAL_TABLET | Freq: Every day | ORAL | 0 refills | Status: DC
  Filled 2020-04-27: qty 30, 30d supply, fill #0

## 2020-04-23 MED ORDER — HYDROCHLOROTHIAZIDE 25 MG PO TABS
25.0000 mg | ORAL_TABLET | Freq: Every day | ORAL | Status: DC
  Administered 2020-04-23: 25 mg via ORAL
  Filled 2020-04-23: qty 1

## 2020-04-23 MED ORDER — BLOOD GLUCOSE METER KIT: PACK | 0 refills | Status: DC

## 2020-04-23 MED ORDER — CLOPIDOGREL BISULFATE 75 MG PO TABS
75.0000 mg | ORAL_TABLET | Freq: Every day | ORAL | 0 refills | Status: DC
  Filled 2020-04-27: qty 30, 30d supply, fill #0

## 2020-04-23 MED ORDER — ASPIRIN 325 MG PO TBEC: 325.0000 mg | DELAYED_RELEASE_TABLET | Freq: Every day | ORAL | 0 refills | Status: DC

## 2020-04-23 NOTE — Progress Notes (Signed)
  Speech Language Pathology Treatment: Cognitive-Linquistic  Patient Details Name: Nathaniel Spears MRN: [id number] DOB: 10-08-1975 Today's Date: 04/23/2020 Time: 2694-8546 SLP Time Calculation (min) (ACUTE ONLY): 12 min  Assessment / Plan / Recommendation Clinical Impression  Pt preparing for D/C; his partner, Nathaniel Spears, was present for education.  Reviewed nature of Broca's aphasia, tips for communicating. [name] verbalized understanding. [name] speech output today is consistent with yesterday - he is naming to confrontation with cues needed to cease perseverations and cloze phrases needed 20% of the time.  He is able to produce short phrases of 3-4 words with pauses for word retrieval but no paraphasias.  First letter cueing on alphabet board does not appear to offer help at this time.  Provided encouragement.  Pt will be going to Neuro OP for SLP f/u.    HPI HPI: This is a 45 year old male with a past medical history significant for hypertension, new diagnosis of diabetes, ongoing tobacco abuse, cocaine abuse, presenting with a left MCA territory stroke.      SLP Plan  All goals met       Recommendations   OP SLP                Follow up Recommendations: Outpatient SLP SLP Visit Diagnosis: Aphasia (R47.01) Plan: All goals met       GO                Juan Quam Laurice 04/23/2020, 1:30 PM Estill Bamberg L. Tivis Ringer, [place] Office number [phone] Pager [phone]

## 2020-04-23 NOTE — Progress Notes (Addendum)
Inpatient Diabetes Program Recommendations  AACE/ADA: New Consensus Statement on Inpatient Glycemic Control (2015)  Target Ranges:  Prepandial:   less than 140 mg/dL      Peak postprandial:   less than 180 mg/dL (1-2 hours)      Critically ill patients:  140 - 180 mg/dL   Lab Results  Component Value Date   GLUCAP 180 (H) 04/23/2020   HGBA1C 11.1 (H) 04/20/2020    Review of Glycemic Control Results for Nathaniel Spears, Nathaniel Spears (MRN [id number]) as of 04/22/2020 11:43  Ref. Range 04/21/2020 06:15 04/21/2020 11:13 04/21/2020 16:08 04/21/2020 21:03 04/22/2020 06:09  Glucose-Capillary Latest Ref Range: 70 - 99 mg/dL 973 (H) 532 (H) 992 (H) 200 (H) 210 (H)  Results for Nathaniel Spears, Nathaniel Spears (MRN [id number]) as of 04/23/2020 10:01  Ref. Range 04/22/2020 06:09 04/22/2020 11:49 04/22/2020 16:30 04/22/2020 21:08 04/23/2020 06:14  Glucose-Capillary Latest Ref Range: 70 - 99 mg/dL 222 (H) 979 (H) 892 (H) 210 (H) 180 (H)   Diabetes history: DM 2 new diagnosis Outpatient Diabetes medications:  Current orders for Inpatient glycemic control:  Novolog 0-9 units tid + hs Metformin 500 mg bid  Inpatient Diabetes Program Recommendations:    -  Start Glipizide 5 mg Daily  Glucose elevated very low 200 range. May can get by with orals. Start oral meds watch for GI side effects of metformin. Follow glucose trends for now.  Thanks,  Christena Deem RN, MSN, BC-ADM Inpatient Diabetes Coordinator Team Pager [phone] (8a-5p)

## 2020-04-23 NOTE — Progress Notes (Addendum)
Physical Therapy Treatment/Discharge Patient Details Name: Nathaniel Spears MRN: [id number] DOB: June 25, 1975 Today's Date: 04/23/2020    History of Present Illness This is a 45 year old male with a past medical history significant for hypertension, new diagnosis of diabetes, ongoing tobacco abuse, cocaine abuse, presenting with a left MCA territory stroke.    PT Comments    Pt doing well with mobility and no further PT needed.  Ready for dc from PT standpoint.     Follow Up Recommendations  No PT follow up     Equipment Recommendations  None recommended by PT    Recommendations for Other Services       Precautions / Restrictions Precautions Precautions: None Precaution Comments: some R side inattention Restrictions Weight Bearing Restrictions: No    Mobility  Bed Mobility Overal bed mobility: Independent                  Transfers Overall transfer level: Independent   Transfers: Sit to/from Stand Sit to Stand: Independent         General transfer comment: Pt steady to rise for multiple times for sit to stand  Ambulation/Gait Ambulation/Gait assistance: Independent Gait Distance (Feet): 300 Feet Assistive device: None Gait Pattern/deviations: Wide base of support     General Gait Details: Pt with steady gait without signs of instability. Pt has wide base of support but likely due to body habitus.   Stairs   Stairs assistance: Modified independent (Device/Increase time) Stair Management: Alternating pattern;Forwards;One rail Left Number of Stairs: 12     Wheelchair Mobility    Modified Rankin (Stroke Patients Only)       Balance Overall balance assessment: Independent             Standing balance comment: no physical assist or LOB noted. Wide BOS and increased time when walking which may be his baseline                            Cognition Arousal/Alertness: Awake/alert Behavior During Therapy: WFL for tasks  assessed/performed Overall Cognitive Status: Difficult to assess Area of Impairment: Following commands;Problem solving                             General Comments: Pt followed all commands      Exercises      General Comments        Pertinent Vitals/Pain Pain Assessment: No/denies pain    Home Living                      Prior Function            PT Goals (current goals can now be found in the care plan section) Acute Rehab PT Goals Patient Stated Goal: talk better Progress towards PT goals: Goals met/education completed, patient discharged from PT    Frequency           PT Plan Current plan remains appropriate    Co-evaluation              AM-PAC PT "6 Clicks" Mobility   Outcome Measure  Help needed turning from your back to your side while in a flat bed without using bedrails?: None Help needed moving from lying on your back to sitting on the side of a flat bed without using bedrails?: None Help needed moving to and from a bed to a chair (including a  wheelchair)?: None Help needed standing up from a chair using your arms (e.g., wheelchair or bedside chair)?: None Help needed to walk in hospital room?: None Help needed climbing 3-5 steps with a railing? : None 6 Click Score: 24    End of Session Equipment Utilized During Treatment: Gait belt Activity Tolerance: Patient tolerated treatment well Patient left: with call bell/phone within reach;in bed;with family/visitor present   PT Visit Diagnosis: Other abnormalities of gait and mobility (R26.89)     Time: 2336-1224 PT Time Calculation (min) (ACUTE ONLY): 8 min  Charges:  $Gait Training: 8-22 mins                     [place] Pager [phone] Office [place] 04/23/2020, 11:41 AM

## 2020-04-23 NOTE — Discharge Summary (Signed)
Physician Discharge Summary  Nathaniel Spears XBJ:[id number] DOB: 07/16/1975 DOA: 04/20/2020  PCP: Nathaniel Perna, NP  Admit date: 04/20/2020 Discharge date: 04/23/2020  Admitted From: Home  Disposition:  Home   Recommendations for Outpatient Follow-up and new medication changes:  1. Follow up with Nathaniel Mire NP in 7 days.  2. Patient placed on aspirin and clopidogrel for 3 months then continue with aspirin alone. 3. Started on statin therapy 4. Placed on metformin and instructed to check capillary glucose before meals and keep a log. Received diabetic teaching. 5. Avoid cocaine   Home Health: yes   Equipment/Devices: walker    Discharge Condition: stable  CODE STATUS: full  Diet recommendation: heart healthy and diabetic prudent.   Brief/Interim Summary: Nathaniel Spears was admitted to the hospital with the working diagnosis of acute ischemic infarct left middle frontal lobe.   45 year old male with significant past medical history for hypertension and tobacco abuse who presented with aphasia.  Reported being at his baseline when suddenly developed aphasia about 12 hours prior to hospitalization.  Because of persistent symptoms he came to the hospital for further evaluation.  His blood pressure was 167/105, heart rate 88, respiratory rate 23, temperature 98.8, oxygenation 100% on room air.  His lungs were clear to auscultation bilaterally, heart S1-S2, present rhythmic, soft abdomen, no lower extremity edema.  Patient had significant expressive aphasia and his strength was preserved all 4 extremities.  Sodium 139, potassium 3.3, chloride 99, bicarb 28, glucose 267, BUN 11, creatinine 1.1, white count 7.9, hemoglobin 15.2, hematocrit 46.2, platelets 206. SARS COVID-19 negative. Urine analysis specific gravity 1.026, negative for infection, > 500 glucose. Toxicology positive for cocaine.  Head CT with remote infarcts left cerebellum and left parietal lobe. Brain MRI with acute  infarct left middle frontal lobe without hemorrhage.  Patchy stroke distribution.  EKG 89 bpm, normal axis, normal intervals, sinus rhythm, no significant ST segment changes, negative T waves in lead I-aVL.  He has been placed on dual antiplatelet and statin therapy.   Patient slowly recovering for neurologic deficit, plan to follow up with neurology as outpatient, continue PT/OT and speech therapy at home.  1.  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  Patient was admitted to the medical ward, he was placed on a remote telemetry monitor. He was placed on dual antiplatelet therapy with a 325 mg of aspirin and 75 mg of clopidogrel.  Further work-up with CT angiography with no large vessel occlusion, severe atheromatosis disease involving the distal MCA branches bilaterally with associated moderate to severe multifocal bilateral M2 and M3 stenosis.   Brain MRA with no intracranial stenosis or large vessel occlusion. Echocardiography with preserved LV systolic function 60 to 30% with moderate LVH. His LDL was 53, HDL 26, total cholesterol 103, triglycerides 118.  Patient was evaluated by neurology, recommendations to continue dual antiplatelet therapy for 3 months then continue aspirin alone.  Aggressive stroke risk factor modification, smoking and cocaine cessation.  2.  Uncontrolled hypertension, hypertensive emergency.  Patient was allowed to have permissive hypertension in the setting of acute ischemic stroke. Now his antihypertensive agents have been resumed, amlodipine, hydrochlorothiazide and losartan.  Follow-up as an outpatient.  3.  Hypokalemia.  Potassium was corrected potassium chloride with good toleration.  At discharge sodium 138, potassium 3.5, chloride 103, bicarb 26, glucose 197, BUN 6, creatinine 0.90.  4.  New onset type 2 diabetes mellitus, uncontrolled, hemoglobin A1c 11.1.  Patient was placed on insulin sliding scale during his hospitalization.  He has been  started on Metformin.  He received diabetic education, and a prescription for a glucometer, instructions to check capillary glucose before meals and keep a log.  5.  Tobacco and cocaine use.  Smoking cocaine cessation counseling. No cocaine intoxication on admission.   Discharge Diagnoses:  Principal Problem:   Acute CVA (cerebrovascular accident) [facility]) Active Problems:   Essential hypertension   CVA (cerebral vascular accident) ([place])   Aphasia   Polysubstance abuse ([place])   Diabetes mellitus, new onset ([place])   Hypokalemia    Discharge Instructions  Discharge Instructions    Ambulatory referral to Nutrition and Diabetic Education   Complete by: As directed    Admit with CVA and New diagnosis of DM.  A1c= 11.1%.  May need insulin for home.  May go to Inpatient Rehab after d/c from acute care so may want to wait to call pt for appt until after d/c home.  Has very supportive girlfriend involved in care and has Insurance thru [employer] as well.     Allergies as of 04/23/2020   No Known Allergies     Medication List    TAKE these medications   amLODipine 10 MG tablet Commonly known as: NORVASC TAKE 1 TABLET BY MOUTH DAILY.   aspirin 325 MG EC tablet Take 1 tablet (325 mg total) by mouth daily. Start taking on: April 24, 2020   atorvastatin 40 MG tablet Commonly known as: LIPITOR Take 1 tablet (40 mg total) by mouth daily. Start taking on: April 24, 2020   blood glucose meter kit and supplies Dispense based on patient and insurance preference. Use up to four times daily as directed. (FOR ICD-10 E10.9, E11.9).   clopidogrel 75 MG tablet Commonly known as: PLAVIX Take 1 tablet (75 mg total) by mouth daily. Start taking on: April 24, 2020   losartan-hydrochlorothiazide 100-25 MG tablet Commonly known as: HYZAAR Take 1 tablet by mouth daily. What changed: Another medication with the same name was removed. Continue taking this medication, and follow the directions you see here.    metFORMIN 500 MG tablet Commonly known as: GLUCOPHAGE Take 1 tablet (500 mg total) by mouth 2 (two) times daily with a meal.       No Known Allergies  Consultations:  Neurology    Procedures/Studies: CT ANGIO HEAD W OR WO CONTRAST  Result Date: 04/21/2020 CLINICAL DATA:  Follow-up examination for acute stroke. EXAM: CT ANGIOGRAPHY HEAD AND NECK TECHNIQUE: Multidetector CT imaging of the head and neck was performed using the standard protocol during bolus administration of intravenous contrast. Multiplanar CT image reconstructions and MIPs were obtained to evaluate the vascular anatomy. Carotid stenosis measurements (when applicable) are obtained utilizing NASCET criteria, using the distal internal carotid diameter as the denominator. CONTRAST:  15m OMNIPAQUE IOHEXOL 350 MG/ML SOLN COMPARISON:  Previous MRI from 04/20/2020 FINDINGS: CT HEAD FINDINGS Brain: Age-related cerebral atrophy with chronic small vessel ischemic disease. Chronic ischemic infarcts involving the left parietal lobe and left cerebellum noted. Previously identified patchy small volume infarcts involving the left frontal operculum are grossly stable in size and distribution from previous, better evaluated on prior brain MRI. No evidence for hemorrhagic transformation, mass effect, or other complication. No other acute large vessel territory infarct or hemorrhage. No mass lesion or midline shift. No hydrocephalus or extra-axial fluid collection. Vascular: No hyperdense vessel. Skull: Scalp soft tissues and calvarium within normal limits. Sinuses: Paranasal sinuses and mastoid air cells are clear. Orbits: Globes and orbital soft tissues demonstrate no  acute finding. Review of the MIP images confirms the above findings CTA NECK FINDINGS Aortic arch: Visualized aortic arch normal in caliber. Aberrant right subclavian artery coursing posteriorly to the esophagus noted. No hemodynamically significant stenosis about the origin of the  great vessels. Right carotid system: Right common and internal carotid arteries widely patent without stenosis, dissection or occlusion. Left carotid system: Left common and internal carotid arteries widely patent without stenosis, dissection or occlusion. Vertebral arteries: Both vertebral arteries arise from the subclavian arteries. No proximal subclavian artery stenosis. Both vertebral arteries widely patent without stenosis, dissection or occlusion. Skeleton: No visible acute osseous abnormality. No discrete or worrisome osseous lesions. Moderate cervical spondylosis noted at C5-6 and C6-7 without high-grade stenosis. Other neck: No other acute soft tissue abnormality within the neck. 8 mm nodule present at the inferior left thyroid lobe, felt to be of doubtful significance given size and patient age, no follow-up imaging recommended (ref: J Am Coll Radiol. 2015 Feb;12(2): 143-50).No other mass or adenopathy. Esophageal prominence at the level of the aberrant right subclavian artery noted. Upper chest: Visualized upper chest demonstrates no acute finding. Partially visualized lungs are clear. Review of the MIP images confirms the above findings CTA HEAD FINDINGS Anterior circulation: Petrous, cavernous, and supraclinoid segments widely patent without stenosis or other abnormality. A1 segments widely patent. Normal anterior communicating artery complex. Anterior cerebral arteries patent to their distal aspects without stenosis. No M1 stenosis or occlusion. Normal MCA bifurcations. There is age advanced severe atheromatous disease involving the MCA branches bilaterally. Associated multifocal moderate to severe proximal M2 stenoses (series 9, images 112, 114 on the left, series 9, images 105, 101 on the right. Additional moderate to severe atheromatous changes seen distally within the MCA branches as well, with particular note made of a severe distal left M3 stenosis (series 9, image 85) as well as a severe distal  right M2/M3 stenosis (series 9, image 98). MCA branches remain perfused and grossly symmetric at this time. Posterior circulation: Both V4 segments patent to the vertebrobasilar junction without stenosis. Both PICA origins patent and normal. Basilar patent to its distal aspect without stenosis. Superior cerebellar arteries patent bilaterally. Both PCAs primarily supplied via the basilar. Mild atheromatous irregularity within the P2 segments without high-grade stenosis. Venous sinuses: Grossly patent allowing for timing the contrast bolus. Anatomic variants: None significant.  No aneurysm. Review of the MIP images confirms the above findings IMPRESSION: CT HEAD IMPRESSION: 1. Continued interval evolution in patchy small volume acute left MCA distribution infarcts, grossly stable and better evaluated on recent MRI from 04/20/2020. No evidence for hemorrhagic transformation or other complication. 2. No other new acute intracranial abnormality. 3. 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 4. Underlying moderate to severe chronic microvascular ischemic disease for age. CTA HEAD AND NECK IMPRESSION: 1. Negative CTA for large vessel occlusion. 2. 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 The severely diseased MCA branches remain perfused and grossly symmetric at this time. 3. Otherwise wide patency of the major arterial vasculature of the head and neck. No other proximal high-grade or correctable stenosis. 4. Aberrant right subclavian artery. Electronically Signed   By: Jeannine Boga M.D.   On: 04/21/2020 04:12   CT HEAD WO CONTRAST  Result Date: 04/20/2020 CLINICAL DATA:  Aphasia, left-sided facial droop starting yesterday. EXAM: CT HEAD WITHOUT CONTRAST TECHNIQUE: Contiguous axial images were obtained from the base of the skull through  the vertex without intravenous contrast.  COMPARISON:  02/10/2020 FINDINGS: Brain: Remote infarcts of the left cerebellum and left parietal lobe. Periventricular white matter and corona radiata hypodensities favor chronic ischemic microvascular white matter disease. Otherwise, the brainstem, cerebellum, cerebral peduncles, thalamus, basal ganglia, basilar cisterns, and ventricular system appear within normal limits. No intracranial hemorrhage, mass lesion, or acute CVA. Vascular: Unremarkable Skull: Unremarkable Sinuses/Orbits: Suspected mucosal swelling in the nasal cavity. Otherwise unremarkable. Other: No supplemental non-categorized findings. IMPRESSION: 1. Remote infarcts of the left cerebellum and left parietal lobe. Periventricular white matter and corona radiata hypodensities favor chronic ischemic microvascular white matter disease. 2. No acute intracranial findings. Electronically Signed   By: Van Clines M.D.   On: 04/20/2020 14:48   CT ANGIO NECK W OR WO CONTRAST  Result Date: 04/21/2020 CLINICAL DATA:  Follow-up examination for acute stroke. EXAM: CT ANGIOGRAPHY HEAD AND NECK TECHNIQUE: Multidetector CT imaging of the head and neck was performed using the standard protocol during bolus administration of intravenous contrast. Multiplanar CT image reconstructions and MIPs were obtained to evaluate the vascular anatomy. Carotid stenosis measurements (when applicable) are obtained utilizing NASCET criteria, using the distal internal carotid diameter as the denominator. CONTRAST:  25m OMNIPAQUE IOHEXOL 350 MG/ML SOLN COMPARISON:  Previous MRI from 04/20/2020 FINDINGS: CT HEAD FINDINGS Brain: Age-related cerebral atrophy with chronic small vessel ischemic disease. Chronic ischemic infarcts involving the left parietal lobe and left cerebellum noted. Previously identified patchy small volume infarcts involving the left frontal operculum are grossly stable in size and distribution from previous, better evaluated on prior brain MRI. No  evidence for hemorrhagic transformation, mass effect, or other complication. No other acute large vessel territory infarct or hemorrhage. No mass lesion or midline shift. No hydrocephalus or extra-axial fluid collection. Vascular: No hyperdense vessel. Skull: Scalp soft tissues and calvarium within normal limits. Sinuses: Paranasal sinuses and mastoid air cells are clear. Orbits: Globes and orbital soft tissues demonstrate no acute finding. Review of the MIP images confirms the above findings CTA NECK FINDINGS Aortic arch: Visualized aortic arch normal in caliber. Aberrant right subclavian artery coursing posteriorly to the esophagus noted. No hemodynamically significant stenosis about the origin of the great vessels. Right carotid system: Right common and internal carotid arteries widely patent without stenosis, dissection or occlusion. Left carotid system: Left common and internal carotid arteries widely patent without stenosis, dissection or occlusion. Vertebral arteries: Both vertebral arteries arise from the subclavian arteries. No proximal subclavian artery stenosis. Both vertebral arteries widely patent without stenosis, dissection or occlusion. Skeleton: No visible acute osseous abnormality. No discrete or worrisome osseous lesions. Moderate cervical spondylosis noted at C5-6 and C6-7 without high-grade stenosis. Other neck: No other acute soft tissue abnormality within the neck. 8 mm nodule present at the inferior left thyroid lobe, felt to be of doubtful significance given size and patient age, no follow-up imaging recommended (ref: J Am Coll Radiol. 2015 Feb;12(2): 143-50).No other mass or adenopathy. Esophageal prominence at the level of the aberrant right subclavian artery noted. Upper chest: Visualized upper chest demonstrates no acute finding. Partially visualized lungs are clear. Review of the MIP images confirms the above findings CTA HEAD FINDINGS Anterior circulation: Petrous, cavernous, and  supraclinoid segments widely patent without stenosis or other abnormality. A1 segments widely patent. Normal anterior communicating artery complex. Anterior cerebral arteries patent to their distal aspects without stenosis. No M1 stenosis or occlusion. Normal MCA bifurcations. There is age advanced severe atheromatous disease involving the MCA branches bilaterally. Associated multifocal moderate to  severe proximal M2 stenoses (series 9, images 112, 114 on the left, series 9, images 105, 101 on the right. Additional moderate to severe atheromatous changes seen distally within the MCA branches as well, with particular note made of a severe distal left M3 stenosis (series 9, image 85) as well as a severe distal right M2/M3 stenosis (series 9, image 98). MCA branches remain perfused and grossly symmetric at this time. Posterior circulation: Both V4 segments patent to the vertebrobasilar junction without stenosis. Both PICA origins patent and normal. Basilar patent to its distal aspect without stenosis. Superior cerebellar arteries patent bilaterally. Both PCAs primarily supplied via the basilar. Mild atheromatous irregularity within the P2 segments without high-grade stenosis. Venous sinuses: Grossly patent allowing for timing the contrast bolus. Anatomic variants: None significant.  No aneurysm. Review of the MIP images confirms the above findings IMPRESSION: CT HEAD IMPRESSION: 1. Continued interval evolution in patchy small volume acute left MCA distribution infarcts, grossly stable and better evaluated on recent MRI from 04/20/2020. No evidence for hemorrhagic transformation or other complication. 2. No other new acute intracranial abnormality. 3. 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 4. Underlying moderate to severe chronic microvascular ischemic disease for age. CTA HEAD AND NECK IMPRESSION: 1. Negative CTA for large vessel occlusion. 2. Severe atheromatous disease involving the distal MCA branches  bilaterally with associated moderate to severe multifocal bilateral M2 and M3 stenoses as above. The severely diseased MCA branches remain perfused and grossly symmetric at this time. 3. Otherwise wide patency of the major arterial vasculature of the head and neck. No other proximal high-grade or correctable stenosis. 4. Aberrant right subclavian artery. Electronically Signed   By: Jeannine Boga M.D.   On: 04/21/2020 04:12   MR ANGIO HEAD WO CONTRAST  Result Date: 04/20/2020 CLINICAL DATA:  Acute neuro deficit.  Aphasia EXAM: MRI HEAD WITHOUT CONTRAST MRA HEAD WITHOUT CONTRAST TECHNIQUE: Multiplanar, multiecho pulse sequences of the brain and surrounding structures were obtained without intravenous contrast. Angiographic images of the head were obtained using MRA technique without contrast. COMPARISON:  CT head 04/20/2020 FINDINGS: MRI HEAD FINDINGS Brain: Acute infarct in the left middle frontal lobe extending to the operculum. Multiple small areas of infarct are present in the white matter and cortex in this area. No other acute infarct 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 Patchy white matter hyperintensity bilaterally compatible chronic microvascular ischemia. Negative for hemorrhage, mass, or hydrocephalus. Vascular: Normal arterial flow voids. Skull and upper cervical spine: Negative Sinuses/Orbits: Negative Other: None MRA HEAD FINDINGS Tortuosity of the intracranial circulation causes loss of signal particularly in the middle cerebral arteries bilaterally. This is symmetric and is felt to be artifact. Similar findings are present in the proximal anterior cerebral arteries bilaterally. No intracranial stenosis or large vessel occlusion.  No aneurysm. IMPRESSION: Acute infarct left middle frontal lobe without hemorrhage. Chronic infarct left cerebellum, left parietal lobe and moderate chronic microvascular ischemic change in the white matter Tortuous intracranial  circulation causing artifact in the circle Willis. Allowing for this, no intracranial stenosis or large vessel occlusion is identified. Electronically Signed   By: Franchot Gallo M.D.   On: 04/20/2020 17:37   MR BRAIN WO CONTRAST  Result Date: 04/20/2020 CLINICAL DATA:  Acute neuro deficit.  Aphasia EXAM: MRI HEAD WITHOUT CONTRAST MRA HEAD WITHOUT CONTRAST TECHNIQUE: Multiplanar, multiecho pulse sequences of the brain and surrounding structures were obtained without intravenous contrast. Angiographic images of the head were obtained using MRA technique without contrast. COMPARISON:  CT head 04/20/2020 FINDINGS: MRI HEAD FINDINGS Brain: Acute infarct in the left middle frontal lobe extending to the operculum. Multiple small areas of infarct are present in the white matter and cortex in this area. No other acute infarct 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 Patchy white matter hyperintensity bilaterally compatible chronic microvascular ischemia. Negative for hemorrhage, mass, or hydrocephalus. Vascular: Normal arterial flow voids. Skull and upper cervical spine: Negative Sinuses/Orbits: Negative Other: None MRA HEAD FINDINGS Tortuosity of the intracranial circulation causes loss of signal particularly in the middle cerebral arteries bilaterally. This is symmetric and is felt to be artifact. Similar findings are present in the proximal anterior cerebral arteries bilaterally. No intracranial stenosis or large vessel occlusion.  No aneurysm. IMPRESSION: Acute infarct left middle frontal lobe without hemorrhage. Chronic infarct left cerebellum, left parietal lobe and moderate chronic microvascular ischemic change in the white matter Tortuous intracranial circulation causing artifact in the circle Willis. Allowing for this, no intracranial stenosis or large vessel occlusion is identified. Electronically Signed   By: Franchot Gallo M.D.   On: 04/20/2020 17:37   ECHOCARDIOGRAM  COMPLETE  Result Date: 04/21/2020    ECHOCARDIOGRAM REPORT   Patient Name:   YASSIN SCALES Date of Exam: 04/21/2020 Medical Rec #:  [id number]      Height:       73.0 in Accession #:    [id number]     Weight:       249.8 lb Date of Birth:  08/10/1975       BSA:          2.365 m Patient Age:    56 years       BP:           153/103 mmHg Patient Gender: M              HR:           97 bpm. Exam Location:  Inpatient Procedure: 2D Echo, Cardiac Doppler, Color Doppler and Intracardiac            Opacification Agent Indications:    Stroke  History:        Patient has no prior history of Echocardiogram examinations.                 Risk Factors:Hypertension and Current Smoker.  Sonographer:    Clayton Lefort RDCS (AE) Referring Phys: [id number] [place]  1. Left ventricular ejection fraction, by estimation, is 60 to 65%. The left ventricle has normal function. The left ventricle demonstrates regional wall motion abnormalities (see scoring diagram/findings for description). There is moderate left ventricular hypertrophy. Left ventricular diastolic parameters are consistent with Grade I diastolic dysfunction (impaired relaxation).  2. Right ventricular systolic function is normal. The right ventricular size is normal.  3. The mitral valve is grossly normal. Trivial mitral valve regurgitation.  4. The aortic valve is tricuspid. Aortic valve regurgitation is not visualized.  5. The inferior vena cava is normal in size with greater than 50% respiratory variability, suggesting right atrial pressure of 3 mmHg. Comparison(s): No prior Echocardiogram. Conclusion(s)/Recommendation(s): No intracardiac source of embolism detected on this transthoracic study. A transesophageal echocardiogram is recommended to exclude cardiac source of embolism if clinically indicated. FINDINGS  Left Ventricle: Left ventricular ejection fraction, by estimation, is 60 to 65%. The left ventricle has normal function. The left ventricle demonstrates  regional wall motion abnormalities. Definity contrast agent was given IV to delineate the left ventricular endocardial borders. The  left ventricular internal cavity size was normal in size. There is moderate left ventricular hypertrophy. Left ventricular diastolic parameters are consistent with Grade I diastolic dysfunction (impaired relaxation). Indeterminate filling pressures. Right Ventricle: The right ventricular size is normal. No increase in right ventricular wall thickness. Right ventricular systolic function is normal. Left Atrium: Left atrial size was normal in size. Right Atrium: Right atrial size was normal in size. Pericardium: There is no evidence of pericardial effusion. Mitral Valve: The mitral valve is grossly normal. Trivial mitral valve regurgitation. MV peak gradient, 2.3 mmHg. The mean mitral valve gradient is 1.0 mmHg. Tricuspid Valve: The tricuspid valve is grossly normal. Tricuspid valve regurgitation is trivial. Aortic Valve: The aortic valve is tricuspid. Aortic valve regurgitation is not visualized. Aortic valve mean gradient measures 4.0 mmHg. Aortic valve peak gradient measures 6.9 mmHg. Aortic valve area, by VTI measures 1.72 cm. Pulmonic Valve: The pulmonic valve was normal in structure. Pulmonic valve regurgitation is not visualized. Aorta: The aortic root and ascending aorta are structurally normal, with no evidence of dilitation. Venous: The inferior vena cava is normal in size with greater than 50% respiratory variability, suggesting right atrial pressure of 3 mmHg. IAS/Shunts: No atrial level shunt detected by color flow Doppler.  LEFT VENTRICLE PLAX 2D LVIDd:         4.80 cm  Diastology LVIDs:         3.50 cm  LV e' medial:    6.20 cm/s LV PW:         1.50 cm  LV E/e' medial:  8.9 LV IVS:        1.60 cm  LV e' lateral:   6.74 cm/s LVOT diam:     1.80 cm  LV E/e' lateral: 8.2 LV SV:         33 LV SV Index:   14 LVOT Area:     2.54 cm  RIGHT VENTRICLE             IVC RV S prime:      10.40 cm/s  IVC diam: 0.80 cm TAPSE (M-mode): 2.3 cm LEFT ATRIUM             Index       RIGHT ATRIUM           Index LA diam:        3.00 cm 1.27 cm/m  RA Area:     13.00 cm LA Vol (A2C):   49.7 ml 21.02 ml/m RA Volume:   32.60 ml  13.79 ml/m LA Vol (A4C):   23.2 ml 9.81 ml/m LA Biplane Vol: 34.8 ml 14.72 ml/m  AORTIC VALVE AV Area (Vmax):    1.76 cm AV Area (Vmean):   1.78 cm AV Area (VTI):     1.72 cm AV Vmax:           131.00 cm/s AV Vmean:          90.300 cm/s AV VTI:            0.192 m AV Peak Grad:      6.9 mmHg AV Mean Grad:      4.0 mmHg LVOT Vmax:         90.70 cm/s LVOT Vmean:        63.000 cm/s LVOT VTI:          0.130 m LVOT/AV VTI ratio: 0.68  AORTA Ao Root diam: 3.30 cm Ao Asc diam:  3.20 cm MITRAL VALVE MV Area (PHT): 3.46 cm  SHUNTS MV Area VTI:   1.34 cm    Systemic VTI:  0.13 m MV Peak grad:  2.3 mmHg    Systemic Diam: 1.80 cm MV Mean grad:  1.0 mmHg MV Vmax:       0.76 m/s MV Vmean:      54.1 cm/s MV Decel Time: 219 msec MV E velocity: 55.20 cm/s MV A velocity: 63.80 cm/s MV E/A ratio:  0.87 Lyman Bishop MD Electronically signed by Lyman Bishop MD Signature Date/Time: 04/21/2020/2:54:43 PM    Final         Subjective: Patient is feeling better, but not at baseline continue to have aphasia and difficulty moving, no nausea or vomiting, no chest pain or dyspnea.   Discharge Exam: Vitals:   04/23/20 0418 04/23/20 0811  BP: (!) 148/111 (!) 148/106  Pulse: 83 88  Resp: 18 18  Temp: 98.2 F (36.8 C) 98.3 F (36.8 C)  SpO2: 93% 97%   Vitals:   04/22/20 2027 04/23/20 0004 04/23/20 0418 04/23/20 0811  BP: (!) 165/106 (!) 147/100 (!) 148/111 (!) 148/106  Pulse: 84 77 83 88  Resp: '16 18 18 18  ' Temp: 98.2 F (36.8 C) 98.7 F (37.1 C) 98.2 F (36.8 C) 98.3 F (36.8 C)  TempSrc: Oral Oral Oral Oral  SpO2: 100% 97% 93% 97%  Weight:      Height:        General: Not in pain or dyspnea.  Neurology: Awake and alert, positive expressive aphasia  E ENT: no pallor,  no icterus, oral mucosa moist Cardiovascular: No JVD. S1-S2 present, rhythmic, no gallops, rubs, or murmurs. No lower extremity edema. Pulmonary: positive breath sounds bilaterally, adequate air movement, no wheezing, rhonchi or rales. Gastrointestinal. Abdomen soft and non tender Skin. No rashes Musculoskeletal: no joint deformities   The results of significant diagnostics from this hospitalization (including imaging, microbiology, ancillary and laboratory) are listed below for reference.     Microbiology: Recent Results (from the past 240 hour(s))  Resp Panel by RT-PCR (Flu A&B, Covid) Nasopharyngeal Swab     Status: None   Collection Time: 04/20/20  1:21 PM   Specimen: Nasopharyngeal Swab; Nasopharyngeal(NP) swabs in vial transport medium  Result Value Ref Range Status   SARS Coronavirus 2 by RT PCR NEGATIVE NEGATIVE Final    Comment: (NOTE) SARS-CoV-2 target nucleic acids are NOT DETECTED.  The SARS-CoV-2 RNA is generally detectable in upper respiratory specimens during the acute phase of infection. The lowest concentration of SARS-CoV-2 viral copies this assay can detect is 138 copies/mL. A negative result does not preclude SARS-Cov-2 infection and should not be used as the sole basis for treatment or other patient management decisions. A negative result may occur with  improper specimen collection/handling, submission of specimen other than nasopharyngeal swab, presence of viral mutation(s) within the areas targeted by this assay, and inadequate number of viral copies(<138 copies/mL). A negative result must be combined with clinical observations, patient history, and epidemiological information. The expected result is Negative.  Fact Sheet for Patients:  EntrepreneurPulse.com.au  Fact Sheet for Healthcare Providers:  IncredibleEmployment.be  This test is no t yet approved or cleared by the [place] FDA and  has been authorized for  detection and/or diagnosis of SARS-CoV-2 by FDA under an Emergency Use Authorization (EUA). This EUA will remain  in effect (meaning this test can be used) for the duration of the COVID-19 declaration under Section 564(b)(1) of the Act, 21 U.S.C.section 360bbb-3(b)(1), unless the authorization is terminated  or revoked sooner.       Influenza A by PCR NEGATIVE NEGATIVE Final   Influenza B by PCR NEGATIVE NEGATIVE Final    Comment: (NOTE) The Xpert Xpress SARS-CoV-2/FLU/RSV plus assay is intended as an aid in the diagnosis of influenza from Nasopharyngeal swab specimens and should not be used as a sole basis for treatment. Nasal washings and aspirates are unacceptable for Xpert Xpress SARS-CoV-2/FLU/RSV testing.  Fact Sheet for Patients: EntrepreneurPulse.com.au  Fact Sheet for Healthcare Providers: IncredibleEmployment.be  This test is not yet approved or cleared by the [place] FDA and has been authorized for detection and/or diagnosis of SARS-CoV-2 by FDA under an Emergency Use Authorization (EUA). This EUA will remain in effect (meaning this test can be used) for the duration of the COVID-19 declaration under Section 564(b)(1) of the Act, 21 U.S.C. section 360bbb-3(b)(1), unless the authorization is terminated or revoked.  Performed at [facility], [place] [address] [place], [place] [address]      Labs: BNP (last 3 results) No results for input(s): BNP in the last 8760 hours. Basic Metabolic Panel: Recent Labs  Lab 04/20/20 1321 04/20/20 2247 04/21/20 0842  NA 139 135 138  K 3.3* 3.1* 3.5  CL 99 102 103  CO2 '28 25 26  ' GLUCOSE 267* 195* 197*  BUN '11 9 6  ' CREATININE 1.10 0.86 0.90  CALCIUM 8.8* 8.5* 8.4*  MG  --  1.8 1.9   Liver Function Tests: Recent Labs  Lab 04/20/20 1321  AST 18  ALT 18  ALKPHOS 86  BILITOT 1.2  PROT 7.7  ALBUMIN 4.1   No results for input(s): LIPASE, AMYLASE in the last  168 hours. Recent Labs  Lab 04/20/20 2247  AMMONIA 20   CBC: Recent Labs  Lab 04/20/20 1321 04/20/20 2247  WBC 7.9 8.0  NEUTROABS 4.8  --   HGB 15.2 14.5  HCT 46.2 44.4  MCV 81.8 80.6  PLT 206 179   Cardiac Enzymes: No results for input(s): CKTOTAL, CKMB, CKMBINDEX, TROPONINI in the last 168 hours. BNP: Invalid input(s): POCBNP CBG: Recent Labs  Lab 04/22/20 1149 04/22/20 1630 04/22/20 2108 04/23/20 0614 04/23/20 1113  GLUCAP 263* 271* 210* 180* 252*   D-Dimer No results for input(s): DDIMER in the last 72 hours. Hgb A1c Recent Labs    04/20/20 2247  HGBA1C 11.1*   Lipid Profile Recent Labs    04/20/20 2247  CHOL 103  HDL 26*  LDLCALC 53  TRIG 118  CHOLHDL 4.0   Thyroid function studies Recent Labs    04/21/20 0842  TSH 1.156   Anemia work up No results for input(s): VITAMINB12, FOLATE, FERRITIN, TIBC, IRON, RETICCTPCT in the last 72 hours. Urinalysis    Component Value Date/Time   COLORURINE YELLOW 04/20/2020 1939   APPEARANCEUR CLEAR 04/20/2020 1939   LABSPEC 1.026 04/20/2020 1939   PHURINE 6.0 04/20/2020 1939   GLUCOSEU >=500 (A) 04/20/2020 1939   HGBUR NEGATIVE 04/20/2020 [place] NEGATIVE 04/20/2020 1939   KETONESUR 80 (A) 04/20/2020 1939   PROTEINUR 30 (A) 04/20/2020 1939   NITRITE NEGATIVE 04/20/2020 1939   LEUKOCYTESUR NEGATIVE 04/20/2020 1939   Sepsis Labs Invalid input(s): PROCALCITONIN,  WBC,  LACTICIDVEN Microbiology Recent Results (from the past 240 hour(s))  Resp Panel by RT-PCR (Flu A&B, Covid) Nasopharyngeal Swab     Status: None   Collection Time: 04/20/20  1:21 PM   Specimen: Nasopharyngeal Swab; Nasopharyngeal(NP) swabs in vial transport medium  Result Value Ref Range  Status   SARS Coronavirus 2 by RT PCR NEGATIVE NEGATIVE Final    Comment: (NOTE) SARS-CoV-2 target nucleic acids are NOT DETECTED.  The SARS-CoV-2 RNA is generally detectable in upper respiratory specimens during the acute phase of  infection. The lowest concentration of SARS-CoV-2 viral copies this assay can detect is 138 copies/mL. A negative result does not preclude SARS-Cov-2 infection and should not be used as the sole basis for treatment or other patient management decisions. A negative result may occur with  improper specimen collection/handling, submission of specimen other than nasopharyngeal swab, presence of viral mutation(s) within the areas targeted by this assay, and inadequate number of viral copies(<138 copies/mL). A negative result must be combined with clinical observations, patient history, and epidemiological information. The expected result is Negative.  Fact Sheet for Patients:  EntrepreneurPulse.com.au  Fact Sheet for Healthcare Providers:  IncredibleEmployment.be  This test is no t yet approved or cleared by the [place] FDA and  has been authorized for detection and/or diagnosis of SARS-CoV-2 by FDA under an Emergency Use Authorization (EUA). This EUA will remain  in effect (meaning this test can be used) for the duration of the COVID-19 declaration under Section 564(b)(1) of the Act, 21 U.S.C.section 360bbb-3(b)(1), unless the authorization is terminated  or revoked sooner.       Influenza A by PCR NEGATIVE NEGATIVE Final   Influenza B by PCR NEGATIVE NEGATIVE Final    Comment: (NOTE) The Xpert Xpress SARS-CoV-2/FLU/RSV plus assay is intended as an aid in the diagnosis of influenza from Nasopharyngeal swab specimens and should not be used as a sole basis for treatment. Nasal washings and aspirates are unacceptable for Xpert Xpress SARS-CoV-2/FLU/RSV testing.  Fact Sheet for Patients: EntrepreneurPulse.com.au  Fact Sheet for Healthcare Providers: IncredibleEmployment.be  This test is not yet approved or cleared by the [place] FDA and has been authorized for detection and/or diagnosis of SARS-CoV-2  by FDA under an Emergency Use Authorization (EUA). This EUA will remain in effect (meaning this test can be used) for the duration of the COVID-19 declaration under Section 564(b)(1) of the Act, 21 U.S.C. section 360bbb-3(b)(1), unless the authorization is terminated or revoked.  Performed at [facility], [place] [address], [place], [place] [address]      Time coordinating discharge: 45 minutes  SIGNED:   Tawni Millers, MD  [facility] 04/23/2020, 11:29 AM

## 2020-04-23 NOTE — Progress Notes (Addendum)
Occupational Therapy Treatment Patient Details Name: Nathaniel Spears MRN: [id number] DOB: 24-Jan-1975 Today's Date: 04/23/2020    History of present illness This is a 45 year old male with a past medical history significant for hypertension, new diagnosis of diabetes, ongoing tobacco abuse, cocaine abuse, presenting with a left MCA territory stroke.   OT comments  Pt progressing towards acute OT goals, remains with deficits in vision/perception, safety, and cognition impacting safety with ADLs and navigating crowded spaces. Pt with decreased functional tracking to his R side, R side inattention. Difficulty locating small and medium sized objects on this R side. Educated on using visual anchors and head turns. Difficult to fully assess cognition due to expressive difficulties. He was able to press buttons on the phone dial "911" when asked what number to call in an emergency. Feel he would benefit from intensive rehab at time of discharge, pt indicated this as well. If he ends up d/c home then recommend HH OT and [facility] nurse for assistance with setting up his medications.    Follow Up Recommendations  CIR    Equipment Recommendations  None recommended by OT    Recommendations for Other Services      Precautions / Restrictions Precautions Precautions: None Precaution Comments: some R side inattention Restrictions Weight Bearing Restrictions: No       Mobility Bed Mobility Overal bed mobility: Independent                  Transfers Overall transfer level: Independent   Transfers: Sit to/from Stand Sit to Stand: Independent              Balance Overall balance assessment: Mild deficits observed, not formally tested             Standing balance comment: no physical assist or LOB noted. Wide BOS and increased time when walking which may be his baseline                           ADL either performed or assessed with clinical judgement   ADL Overall ADL's :  Needs assistance/impaired     Grooming: Supervision/safety;Cueing for sequencing;Cueing for compensatory techniques;Standing Grooming Details (indicate cue type and reason): cues to visually locate items to the right side of a very small sink counter.                             Functional mobility during ADLs: Supervision/safety;Independent;Cueing for safety General ADL Comments: Decreased visual tracking to R side impacting ADLs and safety with mobility in a crowded space. Educated on using visual anchors.     Vision   Vision Assessment?: Yes Eye Alignment: Impaired (comment) Ocular Range of Motion: Other (comment);Within Functional Limits (increased effort/time to track to the R) Alignment/Gaze Preference: Within Defined Limits Tracking/Visual Pursuits: Decreased smoothness of eye movement to RIGHT inferior field;Other (comment) (increased effort/time to track to the R) Convergence: Within functional limits Diplopia Assessment: Other (comment) (pt reports and testing indicated diplopia has resolved) Depth Perception: Overshoots (also with apraxia - so mixture of vision/motor?)   Perception     Praxis      Cognition Arousal/Alertness: Awake/alert Behavior During Therapy: WFL for tasks assessed/performed Overall Cognitive Status: Impaired/Different from baseline Area of Impairment: Following commands;Problem solving                       Following Commands: Follows multi-step commands with  increased time     Problem Solving: Difficulty sequencing General Comments: some difficulty with directional cues and problem solving. Difficult to assess due to impaired communication.        Exercises     Shoulder Instructions       General Comments      Pertinent Vitals/ Pain       Pain Assessment: No/denies pain  Home Living                                          Prior Functioning/Environment              Frequency  Min  2X/week        Progress Toward Goals  OT Goals(current goals can now be found in the care plan section)  Progress towards OT goals: Progressing toward goals  Acute Rehab OT Goals Patient Stated Goal: talk better OT Goal Formulation: With patient/family Time For Goal Achievement: 05/05/20 Potential to Achieve Goals: Good ADL Goals Pt Will Perform Grooming: with modified independence;standing Pt Will Perform Upper Body Dressing: with modified independence;sitting Pt Will Perform Lower Body Dressing: with modified independence;sit to/from stand Pt Will Transfer to Toilet: with modified independence;ambulating Pt Will Perform Toileting - Clothing Manipulation and hygiene: with modified independence;sit to/from stand  Plan Discharge plan remains appropriate    Co-evaluation                 AM-PAC OT "6 Clicks" Daily Activity     Outcome Measure   Help from another person eating meals?: A Little Help from another person taking care of personal grooming?: A Little Help from another person toileting, which includes using toliet, bedpan, or urinal?: None Help from another person bathing (including washing, rinsing, drying)?: A Little Help from another person to put on and taking off regular upper body clothing?: None Help from another person to put on and taking off regular lower body clothing?: None 6 Click Score: 21    End of Session    OT Visit Diagnosis: Other abnormalities of gait and mobility (R26.89);Muscle weakness (generalized) (M62.81);Apraxia (R48.2);Other symptoms and signs involving the nervous system (R29.898);Cognitive communication deficit (R41.841) Symptoms and signs involving cognitive functions: Cerebral infarction   Activity Tolerance Patient tolerated treatment well   Patient Left in bed;with call bell/phone within reach   Nurse Communication          Time: 6503-5465 OT Time Calculation (min): 21 min  Charges: OT General Charges $OT Visit: 1  Visit OT Treatment $SelfCare/ADLs: 8-22 min  Raynald Kemp, OT Acute Rehabilitation Services Pager: [phone] Office: [phone]    Pilar Grammes 04/23/2020, 10:01 AM

## 2020-04-23 NOTE — TOC Initial Note (Signed)
Transition of Care [facility]) - Initial/Assessment Note    Patient Details  Name: Nathaniel Spears MRN: [id number] Date of Birth: 1976/01/13  Transition of [facility]) CM/SW Contact:    Kingsley Plan, RN Phone Number: 04/23/2020, 12:03 PM  Clinical Narrative:                  Spoke to patient and girl friend Nathaniel Spears at bedside. Confirmed face sheet information. Patient and Nathaniel Spears live together and ahe is available to provide assitance 24/7 at home.   Orders for home health OT SP.   No preference.   NCM sent referral to following agencies:   [facility], unable to accept due to staffing.   [facility]  unable to accept due to staffing.  [facility]  unable to accept due to staffing.  [facility]  unable to accept due to staffing.  [facility]  unable to accept due to staffing.  [facility]  unable to accept due to staffing.  [facility]  unable to accept due to staffing.  [facility]  unable to accept due to staffing.  [facility]  unable to accept due to staffing.  [facility]  unable to accept due to staffing.   NCM has exhausted home health agency  list for patient's address.   Nathaniel Spears is able to provide transportation [facility].    Expected Discharge Plan: Home w Home Health Services Barriers to Discharge: No Barriers Identified   Patient Goals and CMS Choice Patient states their goals for this hospitalization and ongoing recovery are:: to return to home CMS Medicare.gov Compare Post Acute Care list provided to:: Patient Choice offered to / list presented to : Patient Nathaniel Spears [phone])  Expected Discharge Plan and Services Expected Discharge Plan: Home w Home Health Services       Living arrangements for the past 2 months: Apartment Expected Discharge Date: 04/23/20               DME Arranged: N/A DME Agency: NA       HH Arranged: OT,Speech Therapy          Prior Living Arrangements/Services Living  arrangements for the past 2 months: Apartment Lives with:: Significant Other Patient language and need for interpreter reviewed:: Yes Do you feel safe going back to the place where you live?: Yes      Need for Family Participation in Patient Care: Yes (Comment) Care giver support system in place?: Yes (comment)   Criminal Activity/Legal Involvement Pertinent to Current Situation/Hospitalization: No - Comment as needed  Activities of Daily Living Home Assistive Devices/Equipment: None ADL Screening (condition at time of admission) [name] cognitive ability adequate to safely complete daily activities?: Yes Is the patient deaf or have difficulty hearing?: No Does the patient have difficulty seeing, even when wearing glasses/contacts?: No Does the patient have difficulty concentrating, remembering, or making decisions?: Yes (new today per pt, some questions had to be asked twice for him to nod appropriately on health hx but he nodded appropriately to most of them) Patient able to express need for assistance with ADLs?: No Does the patient have difficulty dressing or bathing?: No Independently performs ADLs?: Yes (appropriate for developmental age) Does the patient have difficulty walking or climbing stairs?: No Weakness of Legs: None Weakness of Arms/Hands: None  Permission Sought/Granted   Permission granted to share information with : Yes, Verbal Permission Granted  Share Information with NAME: Nathaniel Spears [phone]  Emotional Assessment Appearance:: Appears stated age Attitude/Demeanor/Rapport: Engaged Affect (typically observed): Accepting Orientation: : Oriented to Self,Oriented to Place,Oriented to  Time,Oriented to Situation Alcohol / Substance Use: Not Applicable Psych Involvement: No (comment)  Admission diagnosis:  Aphasia [R47.01] Acute CVA (cerebrovascular accident) (HCC) [I63.9] CVA (cerebral vascular accident) [facility]) [I63.9] Patient Active Problem  List   Diagnosis Date Noted  . CVA (cerebral vascular accident) (HCC) 04/21/2020  . Aphasia   . Polysubstance abuse (HCC)   . Diabetes mellitus, new onset (HCC)   . Hypokalemia   . Acute CVA (cerebrovascular accident) (HCC) 04/20/2020  . Essential hypertension 08/21/2019  . Obesity 08/21/2019   PCP:  Grayce Sessions, NP Pharmacy:   [facility] [phone] - [place], [place] - [address] AT [facility] [address] [address] [place] [address] Phone: [phone] Fax: [phone]  [facility] [address] [place] [address] Phone: [phone] Fax: [phone]     Social Determinants of Health (SDOH) Interventions    Readmission Risk Interventions No flowsheet data found.

## 2020-04-23 NOTE — Progress Notes (Signed)
Inpatient Rehabilitation Admissions Coordinator  I met with patient and his girlfriend at bedside. He lives with his girlfriend and she works from home and can provide 24/7 supervision. I discussed with Dr. Posey Pronto and patient is not in need of an intensive inpt rehab admit at his current functional level. I will alert acute team and TOC.We will sign off at this time.  Danne Baxter, RN, MSN Rehab Admissions Coordinator [phone] 04/23/2020 10:48 AM

## 2020-04-26 ENCOUNTER — Telehealth: Payer: Self-pay

## 2020-04-26 ENCOUNTER — Inpatient Hospital Stay (INDEPENDENT_AMBULATORY_CARE_PROVIDER_SITE_OTHER): Payer: Self-pay | Admitting: Primary Care

## 2020-04-26 NOTE — Telephone Encounter (Signed)
Transition Care Management Follow-up Telephone Call  Date of discharge and from where: 04/23/2020, [facility]   How have you been since you were released from the hospital? He said that he just had a stroke and could not talk. His friend, [name], was with him but on another phone.  He agreed to have her call this CM back.  Need to discuss scheduling follow up appointment with PCP.  He gave this CM permission to speak to [facility]

## 2020-04-27 ENCOUNTER — Other Ambulatory Visit: Payer: Self-pay

## 2020-04-27 ENCOUNTER — Ambulatory Visit: Payer: Medicaid Other | Attending: Primary Care

## 2020-04-27 DIAGNOSIS — R482 Apraxia: Secondary | ICD-10-CM | POA: Insufficient documentation

## 2020-04-27 DIAGNOSIS — R4701 Aphasia: Secondary | ICD-10-CM | POA: Insufficient documentation

## 2020-04-27 DIAGNOSIS — R41840 Attention and concentration deficit: Secondary | ICD-10-CM | POA: Insufficient documentation

## 2020-04-27 DIAGNOSIS — R41844 Frontal lobe and executive function deficit: Secondary | ICD-10-CM | POA: Diagnosis present

## 2020-04-27 DIAGNOSIS — R471 Dysarthria and anarthria: Secondary | ICD-10-CM | POA: Insufficient documentation

## 2020-04-27 DIAGNOSIS — I69353 Hemiplegia and hemiparesis following cerebral infarction affecting right non-dominant side: Secondary | ICD-10-CM | POA: Insufficient documentation

## 2020-04-27 DIAGNOSIS — R29818 Other symptoms and signs involving the nervous system: Secondary | ICD-10-CM | POA: Insufficient documentation

## 2020-04-27 DIAGNOSIS — R41842 Visuospatial deficit: Secondary | ICD-10-CM | POA: Diagnosis present

## 2020-04-27 MED FILL — Amlodipine Besylate Tab 10 MG (Base Equivalent): ORAL | 30 days supply | Qty: 30 | Fill #0 | Status: AC

## 2020-04-27 NOTE — Therapy (Signed)
[facility] [address] [place], [place], [address] Phone: [phone]   Fax:  [phone]  Speech Language Pathology Evaluation  Patient Details  Name: Nathaniel Spears MRN: [id number] Date of Birth: [birth year] Referring Provider (SLP): Arrien, York Ram, MD (Doc= Gwinda Passe NP- PCP)   Encounter Date: 04/27/2020   End of Session - 04/27/20 1721    Visit Number 1    Number of Visits 17    Date for SLP Re-Evaluation 07/26/20    Authorization Type self-pay, U[employer] pending?    SLP Start Time 1230    SLP Stop Time  1315    SLP Time Calculation (min) 45 min    Activity Tolerance Patient tolerated treatment well           Past Medical History:  Diagnosis Date  . Hypertension     History reviewed. No pertinent surgical history.  There were no vitals filed for this visit.       SLP Evaluation [facility] - 04/27/20 1235      SLP Visit Information   SLP Received On 04/23/20    Referring Provider (SLP) Arrien, York Ram, MD (Doc= Gwinda Passe NP- PCP)    Onset Date 04-20-20    Medical Diagnosis CVA      Subjective   Subjective to encourage him to be less frustrated when speaking    Patient/Family Stated Goal "um..um...um..Marland Kitchen I want to be able to hold a conversation with [name]"      General Information   HPI Nathaniel Spears is a 45 year old male with a past medical history significant for hypertension, new diagnosis of diabetes, ongoing tobacco abuse, cocaine abuse, presenting with a left MCA territory stroke.    Mobility Status walked independently      Balance Screen   Has the patient fallen in the past 6 months No    Has the patient had a decrease in activity level because of a fear of falling?  No    Is the patient reluctant to leave their home because of a fear of falling?  No      Prior Functional Status   Cognitive/Linguistic Baseline Within functional limits    Type of Home House     Lives  With Significant other    Available Support Family    Education college    Vocation On disability   internet security     Pain Assessment   Pain Assessment No/denies pain      Cognition   Overall Cognitive Status Difficult to assess    Difficult to assess due to Impaired communication      Auditory Comprehension   Overall Auditory Comprehension Impaired    Yes/No Questions Impaired    Complex Questions 75-100% accurate    Paragraph Comprehension (via yes/no questions) Not tested    Conversation Moderately complex    Interfering Components Working Sales promotion account executive time;Stressing words      Expression   Primary Mode of Expression Verbal      Verbal Expression   Overall Verbal Expression Impaired    Initiation Impaired    Automatic Speech Name;Social Response    Level of Generative/Spontaneous Verbalization Phrase;Word    Repetition Impaired    Level of Impairment Sentence level    Naming Impairment    Responsive 26-50% accurate    Confrontation 25-49% accurate    Convergent Not tested    Divergent Not tested    Verbal Errors Neologisms;Phonemic paraphasias;Perseveration  Pragmatics No impairment    Interfering Components Speech intelligibility    Non-Verbal Means of Communication Writing      Written Expression   Dominant Hand Left    Written Expression Exceptions to [facility]    Dictation Ability Word    Overall Writen Expression able to write some basic personal information with rare min A      Oral Motor/Sensory Function   Overall Oral Motor/Sensory Function Impaired    Labial ROM Within Functional Limits    Labial Symmetry Within Functional Limits    Labial Strength Reduced    Labial Coordination Reduced    Lingual ROM Within Functional Limits    Lingual Symmetry Within Functional Limits    Lingual Strength Reduced Left    Lingual Coordination Reduced      Motor Speech   Overall Motor Speech Impaired    Respiration Within  functional limits    Resonance Within functional limits    Articulation Impaired    Level of Impairment Phrase    Intelligibility Intelligibility reduced    Conversation 50-74% accurate    Motor Planning Impaired    Level of Impairment Word    Motor Speech Errors Inconsistent      Standardized Assessments   Standardized Assessments  Other Assessment   Quick Aphasia Battery= 6.22 (moderate)                          SLP Education - 04/27/20 1721    Education Details eval results, possible goals, word finding strats, multimodal comm    Person(s) Educated Patient;Caregiver(s)    Methods Explanation;Demonstration;Handout;Verbal cues    Comprehension Verbalized understanding;Returned demonstration;Need further instruction;Verbal cues required            SLP Short Term Goals - 04/27/20 1733      SLP SHORT TERM GOAL #1   Title Pt will name 10 personally relevant words/phrases with usual min A over 3 sessions    Time 4    Period Weeks    Status New      SLP SHORT TERM GOAL #2   Title Pt will verbalize 2-3 word sentences in response to simple conversational dialogue with usual min A over 3 sessions    Time 4    Period Weeks    Status New      SLP SHORT TERM GOAL #3   Title Pt will use mulitmodal communication (gesture, draw, write 1st letter etc) to augment verbal expression with usual min A over 3 sessions    Time 4    Period Weeks    Status New      SLP SHORT TERM GOAL #4   Title Pt will use external visual communication aids to express wants/needs for 4/5 opportunities with usual min A    Time 4    Period Weeks    Status New      SLP SHORT TERM GOAL #5   Title Pt/caregiver will complete communication effectivness QOL scale in first 2 sessions    Time 2    Period Weeks    Status New            SLP Long Term Goals - 04/27/20 1741      SLP LONG TERM GOAL #1   Title Pt will verbalize 4-5 word sentences in response to simple open ended questions  with usual min A over 2 sessions    Time 8    Period Weeks    Status  New      SLP LONG TERM GOAL #2   Title Pt will use mulitmodal communication (gesture, draw, write 1st letter etc) to augment verbal expression to meet needs at home with rare min A from family.    Time 8    Period Weeks    Status New      SLP LONG TERM GOAL #3   Title Pt/caregiver will report improvements in frustration level and communication effectivness via QOL scale by last ST session    Time 8    Period Weeks    Status New            Plan - 04/27/20 1722    Clinical Impression Statement Nathaniel Spears presents for OPST evaluation s/p left middle frontal lobe CVA on 04-20-2020. Pt has reportedly had multiple strokes; however, no previous deficits indicated. 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 In a low volume, Dereon stated he has "trouble speaking" with word finding difficulty and slurred speech indicated via yes/no responses. Given severity of communication deficits, pt relied on girlfriend [name] as primary historian. With prolonged processing time, pt stated "um..um...um..Marland Kitchen I want to be able to hold a conversation with [name]." That was longest utterance exhibited throughout evaluation, with limited verbal responses noted with frequent reliance on yes/no or one-word responses exhibited. No swallowing deficits indicated at this time, which had resolved during hospitalization. Reduced oral motor coordination exhibited with usual perseverations exhibited for speech sounds and lingual movements (ex: puck lips reverting to tongue out). SLP assessed writing, which appears to be relative strength despite occasional errors without cues. Skilled ST intervention is warranted to address moderate to severe expressive communication deficits impacting patient's ability to  participate in simple conversations and communicate wants and needs effectively.    Speech Therapy Frequency 2x / week    Duration 8 weeks   or 17 total visits   Treatment/Interventions Compensatory strategies;Cueing hierarchy;Functional tasks;Patient/family education;Cognitive reorganization;Multimodal communcation approach;Environmental controls;Compensatory techniques;Internal/external aids;SLP instruction and feedback;Language facilitation    Potential to Achieve Goals Fair    Potential Considerations Severity of impairments    SLP Home Exercise Plan provided    Consulted and Agree with Plan of Care Patient;Family member/caregiver    Family Member Consulted [name]           Patient will benefit from skilled therapeutic intervention in order to improve the following deficits and impairments:   Aphasia  Verbal apraxia  Dysarthria and anarthria    Problem List Patient Active Problem List   Diagnosis Date Noted  . CVA (cerebral vascular accident) (HCC) 04/21/2020  . Aphasia   . Polysubstance abuse (HCC)   . Diabetes mellitus, new onset (HCC)   . Hypokalemia   . Acute CVA (cerebrovascular accident) (HCC) 04/20/2020  . Essential hypertension 08/21/2019  . Obesity 08/21/2019    Janann Colonel, MA CCC-SLP 04/27/2020, 5:56 PM  [facility] [address] [place], [place], [address] Phone: [phone]   Fax:  [phone]  Name: Billie Intriago MRN: [id number] Date of Birth: 04-28-1975

## 2020-04-28 ENCOUNTER — Encounter: Payer: Self-pay | Attending: Neurology | Admitting: Dietician

## 2020-05-06 ENCOUNTER — Encounter: Payer: Self-pay | Admitting: Occupational Therapy

## 2020-05-06 ENCOUNTER — Other Ambulatory Visit: Payer: Self-pay

## 2020-05-06 ENCOUNTER — Ambulatory Visit: Payer: Medicaid Other | Admitting: Occupational Therapy

## 2020-05-06 ENCOUNTER — Ambulatory Visit: Payer: Medicaid Other

## 2020-05-06 DIAGNOSIS — R41842 Visuospatial deficit: Secondary | ICD-10-CM

## 2020-05-06 DIAGNOSIS — R29818 Other symptoms and signs involving the nervous system: Secondary | ICD-10-CM

## 2020-05-06 DIAGNOSIS — R41844 Frontal lobe and executive function deficit: Secondary | ICD-10-CM

## 2020-05-06 DIAGNOSIS — R471 Dysarthria and anarthria: Secondary | ICD-10-CM

## 2020-05-06 DIAGNOSIS — I69353 Hemiplegia and hemiparesis following cerebral infarction affecting right non-dominant side: Secondary | ICD-10-CM

## 2020-05-06 DIAGNOSIS — R4701 Aphasia: Secondary | ICD-10-CM | POA: Diagnosis not present

## 2020-05-06 DIAGNOSIS — R41840 Attention and concentration deficit: Secondary | ICD-10-CM

## 2020-05-06 DIAGNOSIS — R482 Apraxia: Secondary | ICD-10-CM

## 2020-05-06 NOTE — Therapy (Signed)
[facility] [address] [place], [place], [address] Phone: [phone]   Fax:  [phone]  Occupational Therapy Evaluation  Patient Details  Name: Nathaniel Spears MRN: [id number] Date of Birth: 11/16/1975 Referring Provider (OT): Mauricio Annett Gulaaniel Arrien, MD   Encounter Date: 05/06/2020   OT End of Session - 05/06/20 1706    Visit Number 1    Number of Visits 17    Date for OT Re-Evaluation 07/01/20    Authorization Type Self Pay    OT Start Time 1615    OT Stop Time 1700    OT Time Calculation (min) 45 min    Activity Tolerance Patient tolerated treatment well    Behavior During Therapy [facility] for tasks assessed/performed   emotional          Past Medical History:  Diagnosis Date  . Hypertension     History reviewed. No pertinent surgical history.  There were no vitals filed for this visit.   Subjective Assessment - 05/06/20 1643    Subjective  Pt is a 45 year old that presents to neuro OPOT s/p Left MCA CVA with primary concern being difficulty with speech. Pt reports "to be able to talk" as his primary goal.    Patient is accompanied by: Family member   significant other, [name]   Pertinent History HTN, Diabetes, Tobacco abuse, cocaine abuse    Limitations aphasia. apraxia. no driving.    Patient Stated Goals "to be able to talk"    Currently in Pain? No/denies             [facility] OT Assessment - 05/06/20 1622      Assessment   Medical Diagnosis L MCA CVA    Referring Provider (OT) Mauricio Annett Gulaaniel Arrien, MD    Onset Date/Surgical Date 04/21/19    Hand Dominance Left      Precautions   Precautions Fall    Precaution Comments Aphasia      Balance Screen   Has the patient fallen in the past 6 months No      Home  Environment   Family/patient expects to be discharged to: Private residence    Living Arrangements Spouse/significant other   daughter and granddaughter   Type of Home Aartment     Home Layout Two level    Bathroom Shower/Tub Tub/Shower unit    Journalist, newspaperhower/tub characteristics Curtain    Bathroom Accessibility Yes      Prior Function   Level of Independence Independent    Vocation Unemployed    Leisure video games      ADL   Eating/Feeding Modified independent    Grooming Independent    Upper Body Bathing Modified independent    Lower Body Bathing Modified independent    Upper Body Dressing Independent    Lower Body Dressing Modified independent    Toilet Transfer Modified independent    Toileting - Clothing Manipulation Modified independent    Toileting -  Hygiene Modified Independent    Tub/Shower Transfer Modified independent      IADL   Prior Level of Function Shopping independent    Shopping Assistance for transportation;Needs to be accompanied on any shopping trip    Prior Level of Function Light Housekeeping independent    Light Housekeeping Performs light daily tasks but cannot maintain acceptable level of cleanliness    Prior Level of Function Meal Prep independent    Meal Prep Does not utilize stove or oven    Prior Level of  Function Community Mobility independent - driving    Community Mobility Relies on family or friends for transportation    Prior Level of Function Medication Managment independent    Medication Management Takes responsibility if medication is prepared in advance in seperate dosage    Prior Level of Function Financial Management independent    Financial Management Dependent      Mobility   Mobility Status Independent      Written Expression   Dominant Hand Left      Vision - History   Baseline Vision No visual deficits    Additional Comments pt reports blurriness since the stroke.      Vision Assessment   Visual Fields No apparent deficits   right inattention but responded appropriately to all visual fields   Acuity Impaired - to be further tested in functional context   complaints of blurriness "near and far"      Cognition   Overall Cognitive Status Impaired/Different from baseline    Difficult to assess due to Impaired communication    Cognition Comments Aphasia and Apraxia      Observation/Other Assessments   Focus on Therapeutic Outcomes (FOTO)  72      Coordination   9 Hole Peg Test Right;Left    Right 9 Hole Peg Test 33.54s    Left 9 Hole Peg Test 27.66s    Box and Blocks R - 46 L - 49      Perception   Perception Impaired    Inattention/Neglect Does not attend to right visual field   continue to assess     Praxis   Praxis Impaired    Praxis Impairment Details Motor planning      ROM / Strength   AROM / PROM / Strength AROM;Strength      AROM   Overall AROM  Within functional limits for tasks performed    Overall AROM Comments difficult to assess d/t communication deficits      Strength   Overall Strength Within functional limits for tasks performed    Overall Strength Comments difficult to assess d/t communication deficits      Hand Function   Right Hand Gross Grasp Functional    Right Hand Grip (lbs) 56.6    Left Hand Gross Grasp Functional    Left Hand Grip (lbs) 64.1                           OT Education - 05/06/20 1708    Education Details Education on role and purpose of OT    Person(s) Educated Patient;Spouse    Methods Explanation    Comprehension Verbalized understanding            OT Short Term Goals - 05/06/20 1716      OT SHORT TERM GOAL #1   Title Pt will verbalize understanding of memory compensation strategies    Time 4    Period Weeks    Status New    Target Date 06/03/20      OT SHORT TERM GOAL #2   Title Pt will perform environmental scanning with 75% accuracy    Time 4    Period Weeks    Status New      OT SHORT TERM GOAL #3   Title Pt will perform simple warm meal prep and/or light housekeeping with min A and good safety awareness    Time 4    Period Weeks    Status New  OT SHORT TERM GOAL #4   Title Pt  will complete tabletop scanning with 75% accuracy or greater.    Baseline 55% accuracy with 88 cancellation 28M    Time 4    Period Weeks    Status New      OT SHORT TERM GOAL #5   Title Continue to assess visual deficits and write goal PRN    Time 4    Period Weeks    Status New             OT Long Term Goals - 05/06/20 1717      OT LONG TERM GOAL #1   Title Pt will verbalize understanding of adapted strategies for increasing independence and safety awareness with ADLs and IADLs (i.e. right inattention for cooking, laundry, motor planning for IADLs)    Time 8    Period Weeks    Status New    Target Date 07/01/20      OT LONG TERM GOAL #2   Title Pt perform environmental scanning with a physical component (i.e. tossing ball) with 90% accuracy or greater    Time 8    Period Weeks    Status New      OT LONG TERM GOAL #3   Title Pt will perform simple warm meal prep and/or light housekeeping with supervision and good safety awareness.    Time 8    Period Weeks    Status New      OT LONG TERM GOAL #4   Title Pt will complete a novel cognitive task with sustained attention for 15 minute sor greater with no cues for redirection.    Baseline difficulty with Trail Making A test and with increased time    Time 8    Period Weeks    Status New                 Plan - 05/06/20 1703    Clinical Impression Statement Pt is a 45 year old that presents to Neuro OPOT s/p left MCA CVA. Pt with PMH significant for HTN, Diabetes (recent diagnosis), tobacco abuse and cocaine abuse. Pt presents with deficits with attention to task, sequencing functional tasks, motor planning, complaints of blurry vision and right inattention. Skilled occupational therapy is recommended to target listed areas of deficit and increase independence with daily living tasks.    OT Occupational Profile and History Problem Focused Assessment - Including review of records relating to presenting problem     Occupational performance deficits (Please refer to evaluation for details): IADL's;ADL's;Leisure;Social Participation    Body Structure / Function / Physical Skills ADL;Coordination;Vision;IADL;UE functional use;Proprioception    Cognitive Skills Attention;Sequencing;Perception;Understand;Memory;Problem Solve;Safety Awareness    Rehab Potential Good    Clinical Decision Making Limited treatment options, no task modification necessary    Comorbidities Affecting Occupational Performance: None    Modification or Assistance to Complete Evaluation  No modification of tasks or assist necessary to complete eval    OT Frequency 2x / week    OT Duration 8 weeks   16 visits   OT Treatment/Interventions Self-care/ADL training;Cognitive remediation/compensation;Visual/perceptual remediation/compensation;Patient/family education;Therapeutic activities;Therapeutic exercise;Functional Mobility Training    Plan environmental scanning, sustained attention    Consulted and Agree with Plan of Care Patient;Family member/caregiver    Family Member Consulted significant other, [name]           Patient will benefit from skilled therapeutic intervention in order to improve the following deficits and impairments:   Body Structure / Function /  Physical Skills: ADL,Coordination,Vision,IADL,UE functional use,Proprioception Cognitive Skills: Attention,Sequencing,Perception,Understand,Memory,Problem Solve,Safety Awareness     Visit Diagnosis: Apraxia - Plan: Ot plan of care cert/re-cert  Attention and concentration deficit - Plan: Ot plan of care cert/re-cert  Frontal lobe and executive function deficit - Plan: Ot plan of care cert/re-cert  Other symptoms and signs involving the nervous system - Plan: Ot plan of care cert/re-cert  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 CVA (cerebral vascular accident) (HCC) 04/21/2020  . Aphasia   . Polysubstance abuse (HCC)   . Diabetes mellitus, new onset (HCC)   . Hypokalemia   . Acute CVA (cerebrovascular accident) (HCC) 04/20/2020  . Essential hypertension 08/21/2019  . Obesity 08/21/2019    Junious Dresser MOT, OTR/L  05/06/2020, 5:31 PM  [facility] [address] [place], [place], [address] Phone: [phone]   Fax:  [phone]  Name: Freedom Peddy MRN: [id number] Date of Birth: 02/22/1975

## 2020-05-06 NOTE — Therapy (Signed)
[facility] [address] [place], [place], [address] Phone: [phone]   Fax:  [phone]  Speech Language Pathology Treatment  Patient Details  Name: Nathaniel Spears MRN: [id number] Date of Birth: 18-Mar-1975 Referring Provider (SLP): Arrien, York Ram, MD (Doc= Gwinda Passe NP- PCP)   Encounter Date: 05/06/2020   End of Session - 05/06/20 2001    Visit Number 2    Number of Visits 17    Date for SLP Re-Evaluation 07/26/20    Authorization Type self-pay, UHC pending?    SLP Start Time 1530    SLP Stop Time  1615    SLP Time Calculation (min) 45 min    Activity Tolerance Patient tolerated treatment well           Past Medical History:  Diagnosis Date  . Hypertension     History reviewed. No pertinent surgical history.  There were no vitals filed for this visit.   Subjective Assessment - 05/06/20 1956    Subjective nodded head and smiled    Currently in Pain? No/denies                 ADULT SLP TREATMENT - 05/06/20 1535      General Information   Behavior/Cognition Alert;Cooperative;Pleasant mood      Treatment Provided   Treatment provided Cognitive-Linquistic      Cognitive-Linquistic Treatment   Treatment focused on Aphasia;Apraxia;Dysarthria;Patient/family/caregiver education    Skilled Treatment SLP targeted divergent naming and responsive naming of functional phrases relevant to everyday life. Pt able to generate 4 sentences with additional processing time and rare mod A. Some intermittent decreased speech intelligibility noted seemingly related to oral motor coordination. SLP demonstrated to pt's girlfriend how to appropriate cue patient and allowing him extra time to process. Pt benefited most from min to mod semantic cues and occasionally phonemic cues and carrier phrases. Pt able to manage frustration tolerance related to anomia well this session.      Assessment /  Recommendations / Plan   Plan Continue with current plan of care      Progression Toward Goals   Progression toward goals Progressing toward goals            SLP Education - 05/06/20 2001    Education Details compensations, cuing hierachy, functional phrases    Person(s) Educated Patient;Spouse    Methods Explanation;Demonstration;Handout;Verbal cues    Comprehension Verbalized understanding;Returned demonstration;Verbal cues required;Need further instruction            SLP Short Term Goals - 05/06/20 2002      SLP SHORT TERM GOAL #1   Title Pt will name 10 personally relevant words/phrases with usual min A over 3 sessions    Time 4    Period Weeks    Status On-going      SLP SHORT TERM GOAL #2   Title Pt will verbalize 2-3 word sentences in response to simple conversational dialogue with usual min A over 3 sessions    Time 4    Period Weeks    Status On-going      SLP SHORT TERM GOAL #3   Title Pt will use mulitmodal communication (gesture, draw, write 1st letter etc) to augment verbal expression with usual min A over 3 sessions    Time 4    Period Weeks    Status On-going      SLP SHORT TERM GOAL #4   Title Pt will use external visual communication aids to express  wants/needs for 4/5 opportunities with usual min A    Time 4    Period Weeks    Status On-going      SLP SHORT TERM GOAL #5   Title Pt/caregiver will complete communication effectivness QOL scale in first 2 sessions    Time 2    Period Weeks    Status On-going            SLP Long Term Goals - 05/06/20 2003      SLP LONG TERM GOAL #1   Title Pt will verbalize 4-5 word sentences in response to simple open ended questions with usual min A over 2 sessions    Time 8    Period Weeks    Status On-going      SLP LONG TERM GOAL #2   Title Pt will use mulitmodal communication (gesture, draw, write 1st letter etc) to augment verbal expression to meet needs at home with rare min A from family.    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 SLP targeted divergent naming in personally relevant categories and generation of functional phrases related to every day life. Pt able to generate sentences x4 with additional processing time and rare mod A. Pt able to read sentences back with min A for use of slow rate and loud volume. Pt benefited from semantic cues, phonemic cues, and carrier phrases. SLP educated pt's girlfriend on cueing hierachy and how to appropriately cue patient. Skilled ST intervention is warranted to address moderate to severe expressive communication deficits impacting patient's ability to participate in simple conversations and communicate wants and needs effectively.    Speech Therapy Frequency 2x / week    Duration 8 weeks   or 17 total visits   Treatment/Interventions Compensatory strategies;Cueing hierarchy;Functional tasks;Patient/family education;Cognitive reorganization;Multimodal communcation approach;Environmental controls;Compensatory techniques;Internal/external aids;SLP instruction and feedback;Language facilitation    Potential to Achieve Goals Fair    Potential Considerations Severity of impairments    SLP Home Exercise Plan provided    Consulted and Agree with Plan of Care Patient;Family member/caregiver           Patient will benefit from skilled therapeutic intervention in order to improve the following deficits and impairments:   Aphasia  Verbal apraxia  Dysarthria and anarthria    Problem List Patient Active Problem  List   Diagnosis Date Noted  . CVA (cerebral vascular accident) (HCC) 04/21/2020  . Aphasia   . Polysubstance abuse (HCC)   . Diabetes mellitus, new onset (HCC)   . Hypokalemia   . Acute CVA (cerebrovascular accident) (HCC) 04/20/2020  . Essential hypertension 08/21/2019  . Obesity 08/21/2019    Janann Colonel, MA CCC-SLP 05/06/2020, 8:07 PM  [facility] [address] [place], [place], [address] Phone: [phone]   Fax:  [phone]   Name: Nathaniel Spears MRN: [id number] Date of Birth: 06-22-1975

## 2020-05-06 NOTE — Patient Instructions (Addendum)
Practice these statements:  What's for dinner?  What's on tv?  I have to something to say.   I don't have much of an appetite.   I like to watch [employer] basketball.   I went to [place] for [employer].   We just got a new puppy named Milo.   When are we going grocery shopping?  You are so beautiful.   I am a Chief Financial Officer.

## 2020-05-11 ENCOUNTER — Ambulatory Visit: Payer: Medicaid Other | Admitting: Occupational Therapy

## 2020-05-11 ENCOUNTER — Encounter: Payer: Self-pay | Admitting: Occupational Therapy

## 2020-05-11 ENCOUNTER — Ambulatory Visit: Payer: Medicaid Other

## 2020-05-11 ENCOUNTER — Other Ambulatory Visit: Payer: Self-pay

## 2020-05-11 DIAGNOSIS — R29818 Other symptoms and signs involving the nervous system: Secondary | ICD-10-CM

## 2020-05-11 DIAGNOSIS — R482 Apraxia: Secondary | ICD-10-CM

## 2020-05-11 DIAGNOSIS — R4701 Aphasia: Secondary | ICD-10-CM | POA: Diagnosis not present

## 2020-05-11 DIAGNOSIS — R41840 Attention and concentration deficit: Secondary | ICD-10-CM

## 2020-05-11 DIAGNOSIS — R41842 Visuospatial deficit: Secondary | ICD-10-CM

## 2020-05-11 DIAGNOSIS — I69353 Hemiplegia and hemiparesis following cerebral infarction affecting right non-dominant side: Secondary | ICD-10-CM

## 2020-05-11 DIAGNOSIS — R471 Dysarthria and anarthria: Secondary | ICD-10-CM

## 2020-05-11 NOTE — Therapy (Signed)
[facility] [address] [place], [place], [address] Phone: [phone]   Fax:  [phone]  Speech Language Pathology Treatment  Patient Details  Name: Nathaniel Spears MRN: [id number] Date of Birth: November 13, 1975 Referring Provider (SLP): Arrien, York Ram, MD (Doc= Gwinda Passe NP- PCP)   Encounter Date: 05/11/2020   End of Session - 05/11/20 1231    Visit Number 3    Number of Visits 17    Date for SLP Re-Evaluation 07/26/20    Authorization Type self-pay, UHC pending?    SLP Start Time 1231    SLP Stop Time  1315    SLP Time Calculation (min) 44 min    Activity Tolerance Patient tolerated treatment well           Past Medical History:  Diagnosis Date  . Hypertension     History reviewed. No pertinent surgical history.  There were no vitals filed for this visit.   Subjective Assessment - 05/11/20 1306    Subjective "go..go...good"    Currently in Pain? No/denies                 ADULT SLP TREATMENT - 05/11/20 1307      General Information   Behavior/Cognition Alert;Cooperative;Pleasant mood      Treatment Provided   Treatment provided Cognitive-Linquistic      Cognitive-Linquistic Treatment   Treatment focused on Aphasia;Apraxia;Dysarthria;Patient/family/caregiver education    Skilled Treatment [name] girlfriend reported they have practiced the functional phrases with success. Divergent naming tasks were less successful. SLP reviewed functional phrases, with usual mod A required to slow rate and emphasize each word given usual perseverations and apraxic errors. Pt reportedly felt tired this session, which did appear to impact verbal output. SLP targeted functional naming of items in room, in which pt verbally ID'd 3 independently with semantic paraphasia x1. With occasional visual cues, semantic cues, and carrier phrases, pt named x14 items in total. Pt demo'd independent use of description  strategy x1. SLP introduced SFA analysis this session, in which pt able to provide descriptors with min to mod A. SLP recommended patient and girlfriend practice SFA at home.      Assessment / Recommendations / Plan   Plan Continue with current plan of care      Progression Toward Goals   Progression toward goals Progressing toward goals            SLP Education - 05/11/20 1426    Education Details SFA, functional practice    Person(s) Educated Patient;Spouse    Methods Explanation;Demonstration;Handout;Verbal cues    Comprehension Verbalized understanding;Returned demonstration;Verbal cues required;Need further instruction            SLP Short Term Goals - 05/11/20 1231      SLP SHORT TERM GOAL #1   Title Pt will name 10 personally relevant words/phrases with usual min A over 3 sessions    Time 3    Period Weeks    Status On-going      SLP SHORT TERM GOAL #2   Title Pt will verbalize 2-3 word sentences in response to simple conversational dialogue with usual min A over 3 sessions    Time 3    Period Weeks    Status On-going      SLP SHORT TERM GOAL #3   Title Pt will use mulitmodal communication (gesture, draw, write 1st letter etc) to augment verbal expression with usual min A over 3 sessions    Time 3  Period Weeks    Status On-going      SLP SHORT TERM GOAL #4   Title Pt will use external visual communication aids to express wants/needs for 4/5 opportunities with usual min A    Time 3    Period Weeks    Status On-going      SLP SHORT TERM GOAL #5   Title Pt/caregiver will complete communication effectivness QOL scale in first 2 sessions    Baseline CES=12 & SF=19    Time --    Period --    Status Achieved            SLP Long Term Goals - 05/11/20 1429      SLP LONG TERM GOAL #1   Title Pt will verbalize 4-5 word sentences in response to simple open ended questions with usual min A over 2 sessions    Time 7    Period Weeks    Status On-going       SLP LONG TERM GOAL #2   Title Pt will use mulitmodal communication (gesture, draw, write 1st letter etc) to augment verbal expression to meet needs at home with rare min A from family.    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 SLP targeted naming within current environment and verbalization of functional phrases related to every day life. Usual mod A required to name items in room and successfully verbalize sentences given frequency of perserverations and paraphasias. Pt independently utilized description strategy x1 for word finding. SLP introduced [employer] Analysis this session, with pt able to ID descriptive information with min to mod A. Skilled ST intervention is warranted to address moderate to severe expressive communication deficits impacting patient's ability to participate in simple conversations and communicate wants and needs effectively.    Speech Therapy Frequency 2x / week    Duration 8 weeks   or 17 total visits   Treatment/Interventions Compensatory strategies;Cueing hierarchy;Functional tasks;Patient/family education;Cognitive reorganization;Multimodal communcation approach;Environmental controls;Compensatory techniques;Internal/external aids;SLP instruction and feedback;Language facilitation    Potential to Achieve Goals Fair    Potential Considerations Severity of impairments    SLP Home Exercise Plan provided    Consulted and Agree with Plan of Care Patient;Family member/caregiver    Family Member Consulted Nathaniel Spears            Patient will benefit from skilled therapeutic intervention in order to improve the following deficits and impairments:   Aphasia  Verbal apraxia  Dysarthria and anarthria    Problem List Patient Active Problem List   Diagnosis Date Noted  . CVA (cerebral vascular accident) (HCC) 04/21/2020  . Aphasia   . Polysubstance abuse (HCC)   . Diabetes mellitus, new onset (HCC)   . Hypokalemia   . Acute CVA (cerebrovascular accident) (HCC) 04/20/2020  . Essential hypertension 08/21/2019  . Obesity 08/21/2019    Janann Colonel, MA CCC-SLP 05/11/2020, 3:44 PM  [facility] [address] [place], [place], [address] Phone: [phone]   Fax:  [phone]   Name: Nathaniel Spears MRN: [id number] Date of Birth: [birth year]

## 2020-05-11 NOTE — Therapy (Signed)
[facility] [address] [place], [place], [address] Phone: [phone]   Fax:  [phone]  Occupational Therapy Treatment  Patient Details  Name: Nathaniel Spears MRN: [id number] Date of Birth: October 15, 1975 Referring Provider (OT): Mauricio Annett Gula, MD   Encounter Date: 05/11/2020   OT End of Session - 05/11/20 1444    Visit Number 2    Number of Visits 17    Date for OT Re-Evaluation 07/01/20    Authorization Type Self Pay    OT Start Time 1145    OT Stop Time 1230    OT Time Calculation (min) 45 min           Past Medical History:  Diagnosis Date  . Hypertension     History reviewed. No pertinent surgical history.  There were no vitals filed for this visit.   Subjective Assessment - 05/11/20 1151    Subjective  Patient feels good about cooking last night - helped with dinner    Patient is accompanied by: Family member   significant other - [name]   Pertinent History HTN, Diabetes, Tobacco abuse, cocaine abuse    Limitations aphasia. apraxia. no driving.    Patient Stated Goals "to be able to talk"    Currently in Pain? No/denies    Pain Score 0-No pain                        OT Treatments/Exercises (OP) - 05/11/20 0001      ADLs   Bathing [name] girlfriend reports patient able to turn on water in shower, but then needed assistance to adjust water to showerhead.    Cooking Patient assisted with cooking dinner last night - jambalya.    ADL Comments Reviewed short and long term goals.  Patient and significant other in agreement      Cognitive Exercises   Other Cognitive Exercises 1 Locating speech therapy offices - needed prompting to slow down and locate correct office (on right side of hallway)      Neurological Re-education Exercises   Other Exercises 1 Patient with decreased attention to right hand, and decreased selective attention.  Patient abelt o follow demonstration cues  to fold paper airplane - bilateral task without difficulty.  Patient worked to sort cards from low to high - needed mod cueing and structure initially, but once pattern set - able to follow through with intermittent cueing.  At times holding card in right hand, yet sorting from deck in left hand.  Delayed processing, but if given time, able to complete task.                  OT Education - 05/11/20 1443    Education Details Goals for OT    Person(s) Educated Patient;Spouse   Sig other - [name]   Methods Explanation    Comprehension Verbalized understanding;Need further instruction            OT Short Term Goals - 05/11/20 1157      OT SHORT TERM GOAL #1   Title Pt will verbalize understanding of memory compensation strategies    Time 4    Period Weeks    Status On-going    Target Date 06/03/20      OT SHORT TERM GOAL #2   Title Pt will perform environmental scanning with 75% accuracy    Time 4    Period Weeks    Status On-going  OT SHORT TERM GOAL #3   Title Pt will perform simple warm meal prep and/or light housekeeping with min A and good safety awareness    Time 4    Period Weeks    Status On-going      OT SHORT TERM GOAL #4   Title Pt will complete tabletop scanning with 75% accuracy or greater.    Baseline 55% accuracy with 88 cancellation 37M    Time 4    Period Weeks    Status On-going      OT SHORT TERM GOAL #5   Title Continue to assess visual deficits and write goal PRN    Time 4    Period Weeks    Status On-going             OT Long Term Goals - 05/11/20 1200      OT LONG TERM GOAL #1   Title Pt will verbalize understanding of adapted strategies for increasing independence and safety awareness with ADLs and IADLs (i.e. right inattention for cooking, laundry, motor planning for IADLs)    Time 8    Period Weeks    Status On-going      OT LONG TERM GOAL #2   Title Pt perform environmental scanning with a physical component (i.e.  tossing ball) with 90% accuracy or greater    Time 8    Period Weeks    Status On-going      OT LONG TERM GOAL #3   Title Pt will perform simple warm meal prep and/or light housekeeping with supervision and good safety awareness.    Time 8    Period Weeks    Status On-going      OT LONG TERM GOAL #4   Title Pt will complete a novel cognitive task with sustained attention for 15 minute sor greater with no cues for redirection.    Baseline difficulty with Trail Making A test and with increased time    Time 8    Period Weeks    Status On-going                 Plan - 05/11/20 1445    Clinical Impression Statement Patient agreeable to OT goals.  Patient with limited initiation, selective attention, and right inattention, although both he and significant other report that things are going OK at home.  Patient is slowly returning to some familiar functional activities with support/ supervision.    OT Occupational Profile and History Problem Focused Assessment - Including review of records relating to presenting problem    Occupational performance deficits (Please refer to evaluation for details): IADL's;ADL's;Leisure;Social Participation    Body Structure / Function / Physical Skills ADL;Coordination;Vision;IADL;UE functional use;Proprioception    Cognitive Skills Attention;Sequencing;Perception;Understand;Memory;Problem Solve;Safety Awareness    Rehab Potential Good    Clinical Decision Making Limited treatment options, no task modification necessary    Comorbidities Affecting Occupational Performance: None    Modification or Assistance to Complete Evaluation  No modification of tasks or assist necessary to complete eval    OT Frequency 2x / week    OT Duration 8 weeks   16 visits   OT Treatment/Interventions Self-care/ADL training;Cognitive remediation/compensation;Visual/perceptual remediation/compensation;Patient/family education;Therapeutic activities;Therapeutic  exercise;Functional Mobility Training    Plan environmental scanning, sustained attention - familiar functional tasks - cooking simple meal- frying egg, etc,    Consulted and Agree with Plan of Care Patient;Family member/caregiver    Family Member Consulted significant other, [name]  Patient will benefit from skilled therapeutic intervention in order to improve the following deficits and impairments:   Body Structure / Function / Physical Skills: ADL,Coordination,Vision,IADL,UE functional use,Proprioception Cognitive Skills: Attention,Sequencing,Perception,Understand,Memory,Problem Solve,Safety Awareness     Visit Diagnosis: Attention and concentration deficit  Other symptoms and signs involving the nervous system  Hemiplegia and hemiparesis following cerebral infarction affecting right non-dominant side [facility])  Visuospatial deficit  Apraxia    Problem List Patient Active Problem List   Diagnosis Date Noted  . CVA (cerebral vascular accident) (HCC) 04/21/2020  . Aphasia   . Polysubstance abuse (HCC)   . Diabetes mellitus, new onset (HCC)   . Hypokalemia   . Acute CVA (cerebrovascular accident) (HCC) 04/20/2020  . Essential hypertension 08/21/2019  . Obesity 08/21/2019    Collier Salina, OTR/L 05/11/2020, 2:50 PM  [facility] [address] [place], [place], [address] Phone: [phone]   Fax:  [phone]  Name: Nathaniel Spears MRN: [id number] Date of Birth: September 07, 1975

## 2020-05-13 ENCOUNTER — Ambulatory Visit: Payer: Medicaid Other | Admitting: Occupational Therapy

## 2020-05-13 ENCOUNTER — Encounter: Payer: Self-pay | Admitting: Occupational Therapy

## 2020-05-13 ENCOUNTER — Ambulatory Visit: Payer: Medicaid Other

## 2020-05-13 ENCOUNTER — Other Ambulatory Visit: Payer: Self-pay

## 2020-05-13 DIAGNOSIS — R482 Apraxia: Secondary | ICD-10-CM

## 2020-05-13 DIAGNOSIS — R41842 Visuospatial deficit: Secondary | ICD-10-CM

## 2020-05-13 DIAGNOSIS — R41840 Attention and concentration deficit: Secondary | ICD-10-CM

## 2020-05-13 DIAGNOSIS — I69353 Hemiplegia and hemiparesis following cerebral infarction affecting right non-dominant side: Secondary | ICD-10-CM

## 2020-05-13 DIAGNOSIS — R29818 Other symptoms and signs involving the nervous system: Secondary | ICD-10-CM

## 2020-05-13 DIAGNOSIS — R471 Dysarthria and anarthria: Secondary | ICD-10-CM

## 2020-05-13 DIAGNOSIS — R4701 Aphasia: Secondary | ICD-10-CM

## 2020-05-13 NOTE — Therapy (Signed)
[facility] [address] [place], [place], [address] Phone: [phone]   Fax:  [phone]  Speech Language Pathology Treatment  Patient Details  Name: Nathaniel Spears MRN: [id number] Date of Birth: January 06, 1976 Referring Provider (SLP): Arrien, York Ram, MD (Doc= Gwinda Passe NP- PCP)   Encounter Date: 05/13/2020   End of Session - 05/13/20 1644    Visit Number 4    Number of Visits 17    Date for SLP Re-Evaluation 07/26/20    Authorization Type self-pay, UHC pending?    SLP Start Time 1533    SLP Stop Time  1615    SLP Time Calculation (min) 42 min    Activity Tolerance Patient tolerated treatment well           Past Medical History:  Diagnosis Date  . Hypertension     History reviewed. No pertinent surgical history.  There were no vitals filed for this visit.   Subjective Assessment - 05/13/20 1537    Subjective "No" (re: pain)    Currently in Pain? No/denies                 ADULT SLP TREATMENT - 05/13/20 1537      General Information   Behavior/Cognition Alert;Cooperative;Pleasant mood      Treatment Provided   Treatment provided Cognitive-Linquistic      Cognitive-Linquistic Treatment   Treatment focused on Aphasia;Apraxia;Dysarthria;Patient/family/caregiver education    Skilled Treatment SLP educated pt on anomia compensations (See "pt instructions"), adn provided example of each for pt. As pt had dificulty producing the word during the session SLP highlihghted one of the compensations pt could use. He was reluctant to use drawing but when SLP asked him to pt did so with min cues to make drawing larger and more detailed (baseball - SLP added laces). With responsive naming (cloze phrases) pt 95% successful. Divergent naming -simple categories- pt named <50% items spontaneously, others req'd mod-max A.      Assessment / Recommendations / Plan   Plan Continue with current plan of  care      Progression Toward Goals   Progression toward goals Progressing toward goals            SLP Education - 05/13/20 1550    Education Details compensations for anomia    Person(s) Educated Patient    Methods Explanation;Handout    Comprehension Verbalized understanding;Need further instruction            SLP Short Term Goals - 05/13/20 1645      SLP SHORT TERM GOAL #1   Title Pt will name 10 personally relevant words/phrases with usual min A over 3 sessions    Time 3    Period Weeks    Status On-going      SLP SHORT TERM GOAL #2   Title Pt will verbalize 2-3 word sentences in response to simple conversational dialogue with usual min A over 3 sessions    Time 3    Period Weeks    Status On-going      SLP SHORT TERM GOAL #3   Title Pt will use mulitmodal communication (gesture, draw, write 1st letter etc) to augment verbal expression with usual min A over 3 sessions    Time 3    Period Weeks    Status On-going      SLP SHORT TERM GOAL #4   Title Pt will use external visual communication aids to express wants/needs for 4/5 opportunities with usual  min A    Time 3    Period Weeks    Status On-going      SLP SHORT TERM GOAL #5   Title Pt/caregiver will complete communication effectivness QOL scale in first 2 sessions    Baseline CES=12 & SF=19    Status Achieved            SLP Long Term Goals - 05/13/20 1645      SLP LONG TERM GOAL #1   Title Pt will verbalize 4-5 word sentences in response to simple open ended questions with usual min A over 2 sessions    Time 7    Period Weeks    Status On-going      SLP LONG TERM GOAL #2   Title Pt will use mulitmodal communication (gesture, draw, write 1st letter etc) to augment verbal expression to meet needs at home with rare min A from family.    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 SLP targeted naming today and educated re: compensations for anomia/verbal apraxia.  Skilled ST intervention is warranted to address moderate to severe expressive communication deficits impacting patient's ability to participate in simple conversations and communicate wants and needs effectively.    Speech Therapy Frequency 2x / week    Duration 8 weeks   or 17 total visits   Treatment/Interventions Compensatory strategies;Cueing hierarchy;Functional tasks;Patient/family education;Cognitive reorganization;Multimodal communcation approach;Environmental controls;Compensatory techniques;Internal/external aids;SLP instruction and feedback;Language facilitation    Potential to Achieve Goals Fair    Potential Considerations Severity of impairments    SLP Home Exercise Plan provided    Consulted and Agree with Plan of Care Patient;Family member/caregiver    Family Member Consulted Nathaniel Spears           Patient will benefit from skilled therapeutic intervention in order to improve the following deficits and impairments:   Aphasia  Verbal apraxia  Dysarthria and anarthria    Problem List Patient Active Problem List   Diagnosis Date Noted  . CVA (cerebral vascular accident) (HCC) 04/21/2020  . Aphasia   . Polysubstance abuse (HCC)   . Diabetes mellitus, new onset (HCC)   . Hypokalemia   . Acute CVA (cerebrovascular accident) (HCC) 04/20/2020  . Essential hypertension 08/21/2019  . Obesity 08/21/2019    [facility] ,MS, CCC-SLP  05/13/2020, 4:46 PM  [facility] [address] [place], [place],  [address] Phone: [phone]   Fax:  [phone]   Name: Nathaniel Spears MRN: [id number] Date of Birth: 1975/01/21

## 2020-05-13 NOTE — Patient Instructions (Addendum)
  Please complete the assigned speech therapy homework prior to your next session and return it to the speech therapist at your next visit. ============================================================  When you have trouble saying the word you want to say:  1)  Describe it! Describe the size, color, shape, function, composition (what it's made of), and/or location to be able to have the word come sooner, or to have your listener help you out  2) "talk around the word" (say it a totally different way) -get your point out using different words than the one/ones you can't think of  3) Use a synonym - think of another word that means the exact same thing  4) DRAW! You can draw some things you want to say in order to give your listener a hint about what you're talking about  5)  Gesture- make motions to help your listener understand what you are trying to communicate  6) Write down the word, if you can - or the first letter or letters, to help you say the word or to give your listener a hint about what you're trying to say .stohm

## 2020-05-13 NOTE — Therapy (Signed)
[facility] [address] [place], [place], [address] Phone: [phone]   Fax:  [phone]  Occupational Therapy Treatment  Patient Details  Name: Nathaniel Spears MRN: [id number] Date of Birth: Mar 04, 1975 Referring Provider (OT): Mauricio Annett Gula, MD   Encounter Date: 05/13/2020   OT End of Session - 05/13/20 1448    Visit Number 3    Number of Visits 17    Date for OT Re-Evaluation 07/01/20    Authorization Type Self Pay    OT Start Time 1447    OT Stop Time 1530    OT Time Calculation (min) 43 min    Activity Tolerance Patient tolerated treatment well    Behavior During Therapy [facility] for tasks assessed/performed           Past Medical History:  Diagnosis Date  . Hypertension     History reviewed. No pertinent surgical history.  There were no vitals filed for this visit.   Subjective Assessment - 05/13/20 1448    Subjective  "About the same". Pt denies any pain.    Patient is accompanied by: Family member   significant other - [name]   Pertinent History HTN, Diabetes, Tobacco abuse, cocaine abuse    Limitations aphasia. apraxia. no driving.    Patient Stated Goals "to be able to talk"    Currently in Pain? No/denies             Cooking - scrambling egg. Pt with max difficulty with sequencing, problem solving and locating correct knobs for turning stove on. Pt required max assistance for gathering items, turning stove on, and identifying and problem solving. Poor initiation with task and required max cues for initiation of steps with cooking.   Parquetry Copying pattern (flower) with color tiles - pt required max verbal and visual cues for organizing thoughts and identifying pieces needed for pattern.   Sorting blink cards by color and then by shape. Pt did well with sorting by color after grasping procedure and pattern with min cueing. Pt required max cues and had max difficulty for switching  categories and matching by shape.   Medium pegs sorting by color with min difficulty/errors - pulling out pegs with following auditory instructions (1 and 2 step) with min difficulty.                      OT Short Term Goals - 05/11/20 1157      OT SHORT TERM GOAL #1   Title Pt will verbalize understanding of memory compensation strategies    Time 4    Period Weeks    Status On-going    Target Date 06/03/20      OT SHORT TERM GOAL #2   Title Pt will perform environmental scanning with 75% accuracy    Time 4    Period Weeks    Status On-going      OT SHORT TERM GOAL #3   Title Pt will perform simple warm meal prep and/or light housekeeping with min A and good safety awareness    Time 4    Period Weeks    Status On-going      OT SHORT TERM GOAL #4   Title Pt will complete tabletop scanning with 75% accuracy or greater.    Baseline 55% accuracy with 88 cancellation 62M    Time 4    Period Weeks    Status On-going      OT SHORT TERM GOAL #5  Title Continue to assess visual deficits and write goal PRN    Time 4    Period Weeks    Status On-going             OT Long Term Goals - 05/11/20 1200      OT LONG TERM GOAL #1   Title Pt will verbalize understanding of adapted strategies for increasing independence and safety awareness with ADLs and IADLs (i.e. right inattention for cooking, laundry, motor planning for IADLs)    Time 8    Period Weeks    Status On-going      OT LONG TERM GOAL #2   Title Pt perform environmental scanning with a physical component (i.e. tossing ball) with 90% accuracy or greater    Time 8    Period Weeks    Status On-going      OT LONG TERM GOAL #3   Title Pt will perform simple warm meal prep and/or light housekeeping with supervision and good safety awareness.    Time 8    Period Weeks    Status On-going      OT LONG TERM GOAL #4   Title Pt will complete a novel cognitive task with sustained attention for 15 minute  sor greater with no cues for redirection.    Baseline difficulty with Trail Making A test and with increased time    Time 8    Period Weeks    Status On-going                 Plan - 05/13/20 1534    Clinical Impression Statement Pt with poor initiation with cooking task this day. Pt did well with simple one step sorting (cards by color).    OT Occupational Profile and History Problem Focused Assessment - Including review of records relating to presenting problem    Occupational performance deficits (Please refer to evaluation for details): IADL's;ADL's;Leisure;Social Participation    Body Structure / Function / Physical Skills ADL;Coordination;Vision;IADL;UE functional use;Proprioception    Cognitive Skills Attention;Sequencing;Perception;Understand;Memory;Problem Solve;Safety Awareness    Rehab Potential Good    Clinical Decision Making Limited treatment options, no task modification necessary    Comorbidities Affecting Occupational Performance: None    Modification or Assistance to Complete Evaluation  No modification of tasks or assist necessary to complete eval    OT Frequency 2x / week    OT Duration 8 weeks   16 visits   OT Treatment/Interventions Self-care/ADL training;Cognitive remediation/compensation;Visual/perceptual remediation/compensation;Patient/family education;Therapeutic activities;Therapeutic exercise;Functional Mobility Training    Plan environmental scanning, sustained attention - familiar functional tasks - folding laundry?    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 CVA (cerebral vascular accident) (HCC) 04/21/2020  . Aphasia   . Polysubstance abuse (HCC)   . Diabetes mellitus, new onset (HCC)   . Hypokalemia   . Acute CVA (cerebrovascular accident) (HCC) 04/20/2020  .  Essential hypertension 08/21/2019  . Obesity 08/21/2019    Junious Dresser MOT, OTR/L  05/13/2020, 3:35 PM  [place] [facility] [address] [place], [place], [address] Phone: [phone]   Fax:  [phone]  Name: Lemoyne Nestor MRN: [id number] Date of Birth: March 18, 1975

## 2020-05-18 ENCOUNTER — Other Ambulatory Visit: Payer: Self-pay

## 2020-05-18 ENCOUNTER — Ambulatory Visit (INDEPENDENT_AMBULATORY_CARE_PROVIDER_SITE_OTHER): Payer: Medicaid Other | Admitting: Primary Care

## 2020-05-18 ENCOUNTER — Encounter: Payer: Self-pay | Admitting: Occupational Therapy

## 2020-05-18 ENCOUNTER — Ambulatory Visit: Payer: Medicaid Other | Admitting: Occupational Therapy

## 2020-05-18 ENCOUNTER — Encounter (INDEPENDENT_AMBULATORY_CARE_PROVIDER_SITE_OTHER): Payer: Self-pay | Admitting: Primary Care

## 2020-05-18 ENCOUNTER — Ambulatory Visit: Payer: Medicaid Other | Attending: Primary Care

## 2020-05-18 ENCOUNTER — Other Ambulatory Visit: Payer: Self-pay | Admitting: Pharmacist

## 2020-05-18 VITALS — BP 127/91 | HR 86 | Temp 97.3°F | Ht 73.0 in | Wt 252.6 lb

## 2020-05-18 DIAGNOSIS — E119 Type 2 diabetes mellitus without complications: Secondary | ICD-10-CM

## 2020-05-18 DIAGNOSIS — R29818 Other symptoms and signs involving the nervous system: Secondary | ICD-10-CM | POA: Insufficient documentation

## 2020-05-18 DIAGNOSIS — R41844 Frontal lobe and executive function deficit: Secondary | ICD-10-CM | POA: Insufficient documentation

## 2020-05-18 DIAGNOSIS — I69353 Hemiplegia and hemiparesis following cerebral infarction affecting right non-dominant side: Secondary | ICD-10-CM | POA: Diagnosis present

## 2020-05-18 DIAGNOSIS — R482 Apraxia: Secondary | ICD-10-CM | POA: Diagnosis present

## 2020-05-18 DIAGNOSIS — R41842 Visuospatial deficit: Secondary | ICD-10-CM

## 2020-05-18 DIAGNOSIS — R4701 Aphasia: Secondary | ICD-10-CM | POA: Insufficient documentation

## 2020-05-18 DIAGNOSIS — R41840 Attention and concentration deficit: Secondary | ICD-10-CM | POA: Insufficient documentation

## 2020-05-18 DIAGNOSIS — Z09 Encounter for follow-up examination after completed treatment for conditions other than malignant neoplasm: Secondary | ICD-10-CM

## 2020-05-18 MED ORDER — TRUE METRIX BLOOD GLUCOSE TEST VI STRP
ORAL_STRIP | 2 refills | Status: DC
  Filled 2020-05-18: qty 100, 33d supply, fill #0

## 2020-05-18 MED ORDER — TRUE METRIX METER W/DEVICE KIT
PACK | 0 refills | Status: DC
  Filled 2020-05-18: qty 1, 365d supply, fill #0

## 2020-05-18 MED ORDER — BASAGLAR KWIKPEN 100 UNIT/ML ~~LOC~~ SOPN
10.0000 [IU] | PEN_INJECTOR | Freq: Every day | SUBCUTANEOUS | 2 refills | Status: DC
Start: 2020-05-18 — End: 2020-06-06
  Filled 2020-05-18: qty 3, 30d supply, fill #0

## 2020-05-18 MED ORDER — METFORMIN HCL 1000 MG PO TABS
1000.0000 mg | ORAL_TABLET | Freq: Two times a day (BID) | ORAL | 3 refills | Status: DC
  Filled 2020-05-18: qty 60, 30d supply, fill #0

## 2020-05-18 MED ORDER — TRUEPLUS LANCETS 28G MISC
2 refills | Status: DC
  Filled 2020-05-18: qty 100, 33d supply, fill #0

## 2020-05-18 NOTE — Therapy (Signed)
[facility] [address] [place], [place], [address] Phone: [phone]   Fax:  [phone]  Speech Language Pathology Treatment  Patient Details  Name: Nathaniel Spears MRN: [id number] Date of Birth: [birth year] Referring Provider (SLP): Nathaniel Spears, York Ram, MD (Doc= Gwinda Passe NP- PCP)   Encounter Date: 05/18/2020   End of Session - 05/18/20 1125    Visit Number 5    Number of Visits 17    Date for SLP Re-Evaluation 07/26/20    Authorization Type self-pay, UHC pending?    SLP Start Time 1101    SLP Stop Time  1145    SLP Time Calculation (min) 44 min    Activity Tolerance Patient tolerated treatment well           Past Medical History:  Diagnosis Date  . Hypertension     History reviewed. No pertinent surgical history.  There were no vitals filed for this visit.   Subjective Assessment - 05/18/20 1105    Subjective "with my primary" referring to MD apt    Currently in Pain? No/denies                 ADULT SLP TREATMENT - 05/18/20 1110      General Information   Behavior/Cognition Alert;Cooperative;Pleasant mood      Treatment Provided   Treatment provided Cognitive-Linquistic      Cognitive-Linquistic Treatment   Treatment focused on Aphasia;Apraxia;Dysarthria;Patient/family/caregiver education    Skilled Treatment Pt able to describe this morning MD appointment as "with my primary care" and "diabetes" with additonal processing time. SLP re-educated pt on anomia strategies from last session, as pt verbalized "take your time" as strategy. SLP targeted use of strategies given prompted word, in which pt able to generate verbal description x1 which improved with usual use of carrier phrases. Pt able to independently draw x1 with otherwise usual mod to max prompting to draw. Pt stated "I can see it but I can't draw it." Perseverations noted for drawings x3. SLP trialed gestures, which were  ineffective as pt unable to demo despite modeling. SLP trialed association word pairs, in which pt able to completed with 52% accuracy independently. Usual mod A to ID associations via phonemic cues, semantic cues, and carrier phrases. Pt unable to read phrases independently this session.      Assessment / Recommendations / Plan   Plan Continue with current plan of care      Progression Toward Goals   Progression toward goals Progressing toward goals            SLP Education - 05/18/20 1110    Education Details word finding strategies, functional practice    Person(s) Educated Patient    Methods Handout;Explanation;Demonstration;Verbal cues    Comprehension Verbalized understanding;Returned demonstration;Need further instruction;Verbal cues required            SLP Short Term Goals - 05/18/20 1126      SLP SHORT TERM GOAL #1   Title Pt will name 10 personally relevant words/phrases with usual min A over 3 sessions    Time 2    Period Weeks    Status On-going      SLP SHORT TERM GOAL #2   Title Pt will verbalize 2-3 word sentences in response to simple conversational dialogue with usual min A over 3 sessions    Time 2    Period Weeks    Status On-going      SLP SHORT TERM GOAL #3  Title Pt will use mulitmodal communication (gesture, draw, write 1st letter etc) to augment verbal expression with usual min A over 3 sessions    Time 2    Period Weeks    Status On-going      SLP SHORT TERM GOAL #4   Title Pt will use external visual communication aids to express wants/needs for 4/5 opportunities with usual min A    Time 2    Period Weeks    Status On-going      SLP SHORT TERM GOAL #5   Title Pt/caregiver will complete communication effectivness QOL scale in first 2 sessions    Baseline CES=12 & SF=19    Status Achieved            SLP Long Term Goals - 05/18/20 1126      SLP LONG TERM GOAL #1   Title Pt will verbalize 4-5 word sentences in response to simple open  ended questions with usual min A over 2 sessions    Time 6    Period Weeks    Status On-going      SLP LONG TERM GOAL #2   Title Pt will use mulitmodal communication (gesture, draw, write 1st letter etc) to augment verbal expression to meet needs at home with rare min A from family.    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 SLP targeted responsive naming today and re-educated patient re: compensations for anomia/verbal apraxia. Usual moderate semantic cues, phonemic cues, and carrier phrases required to complete structured tasks. Skilled ST intervention is warranted to address moderate to severe expressive communication deficits impacting patient's ability to participate in simple conversations and communicate wants and needs effectively.    Speech Therapy Frequency 2x / week    Duration 8 weeks   or 17 total visits   Treatment/Interventions Compensatory strategies;Cueing hierarchy;Functional tasks;Patient/family education;Cognitive reorganization;Multimodal communcation approach;Environmental controls;Compensatory techniques;Internal/external aids;SLP instruction and feedback;Language facilitation    Potential to Achieve Goals Fair    Potential Considerations Severity of impairments    SLP Home Exercise Plan provided    Consulted and Agree with Plan of Care Patient           Patient will benefit from skilled therapeutic intervention in order to improve the following deficits and impairments:   Aphasia  Verbal apraxia    Problem List Patient  Active Problem List   Diagnosis Date Noted  . CVA (cerebral vascular accident) (HCC) 04/21/2020  . Aphasia   . Polysubstance abuse (HCC)   . Diabetes mellitus, new onset (HCC)   . Hypokalemia   . Acute CVA (cerebrovascular accident) (HCC) 04/20/2020  . Essential hypertension 08/21/2019  . Obesity 08/21/2019    Janann Colonel, MA CCC-SLP 05/18/2020, 2:18 PM  [place] [facility] [address] [place], [place], [address] Phone: [phone]   Fax:  [phone]   Name: Nathaniel Spears MRN: [id number] Date of Birth: 1975-10-01

## 2020-05-18 NOTE — Progress Notes (Signed)
CC: HFU  HPI  [name] 45 y.o.male presents for follow up from the hospital.  Patient presented to the emergency room on 04/20/2020 with symptoms he was having the day before of slurred speech and drooping mouth. He had previously had the same symptoms earlier this year and left emergency room after waking 16 hours.  At that time I explained to him with his symptoms he needed to stay and been evaluated.  Those were his warning signs now he understands. He was admitted to the hospital on 04/20/20, patient was discharged from the hospital on 04/23/20, patient was admitted for: Essential hypertension, Acute CVA (cerebrovascular accident) [facility]), CVA (cerebral vascular accident) ([place]), Aphasia, Polysubstance abuse ([place]). Diabetes mellitus, new onset ([place]) and Hypokalemia.    Past Medical History:  Diagnosis Date  . Hypertension      No Known Allergies    Current Outpatient Medications on File Prior to Visit  Medication Sig Dispense Refill  . amLODipine (NORVASC) 10 MG tablet TAKE 1 TABLET BY MOUTH DAILY. 90 tablet 3  . aspirin EC 325 MG EC tablet Take 1 tablet (325 mg total) by mouth daily. 30 tablet 0  . atorvastatin (LIPITOR) 40 MG tablet Take 1 tablet (40 mg total) by mouth daily. 30 tablet 0  . blood glucose meter kit and supplies Dispense based on patient and insurance preference. Use up to four times daily as directed. (FOR ICD-10 E10.9, E11.9). 1 each 0  . clopidogrel (PLAVIX) 75 MG tablet Take 1 tablet (75 mg total) by mouth daily. 30 tablet 0  . losartan-hydrochlorothiazide (HYZAAR) 100-25 MG tablet Take 1 tablet by mouth daily. 30 tablet 0  . metFORMIN (GLUCOPHAGE) 500 MG tablet Take 1 tablet (500 mg total) by mouth 2 (two) times daily with a meal. 60 tablet 0   No current facility-administered medications on file prior to visit.    ROS: all negative except above.   Physical Exam: Filed Weights   05/18/20 0939  Weight: 252 lb 9.6 oz (114.6 kg)   BP (!) 127/91 (BP  Location: Left Arm, Patient Position: Sitting, Cuff Size: Large)   Pulse 86   Temp (!) 97.3 F (36.3 C) (Temporal)   Ht _0  (1.854 m)   Wt 252 lb 9.6 oz (114.6 kg)   SpO2 92%   BMI 33.33 kg/m  General Appearance: Well nourished,obese morbid in no apparent distress. Eyes: PERRLA, EOMs, conjunctiva no swelling or erythema Sinuses: No Frontal/maxillary tenderness ENT/Mouth: Ext aud canals clear, TMs without erythema, bulging. No erythema, swelling, or exudate on post pharynx.  Tonsils not swollen or erythematous. Hearing normal.  Neck: Supple, thyroid normal.  Respiratory: Respiratory effort normal, BS equal bilaterally without rales, rhonchi, wheezing or stridor.  Cardio: RRR with no MRGs. Brisk peripheral pulses without edema.  Abdomen: Soft, + BS.  Non tender, no guarding, rebound, hernias, masses. Lymphatics: Non tender without lymphadenopathy.  Musculoskeletal: Full ROM, 5/5 strength, normal gait.  Skin: Warm, dry without rashes, lesions, ecchymosis.  Neuro: Cranial nerves intact. Normal muscle tone, no cerebellar symptoms. Sensation intact.  Psych: Awake and oriented X 3, normal affect, Insight and Judgment appropriate.    Diabetes mellitus, new onset [facility]) Nathaniel Spears was seen today for hospitalization follow-up.  Diagnoses and all orders for this visit:  Diabetes mellitus, new onset ([place]) Discussed  co- morbidities with uncontrol diabetes  Complications -diabetic retinopathy, (close your eyes ? What do you see nothing) nephropathy decrease in kidney function- can lead to dialysis-on a machine 3 days a  week to filter your kidney, neuropathy- numbness and tinging in your hands and feet,  increase risk of heart attack and stroke, and amputation due to decrease wound healing and circulation. Decrease your risk by taking medication daily as prescribed, monitor carbohydrates- foods that are high in carbohydrates are the following rice, potatoes, breads, sugars, and pastas.  Reduction in the  intake (eating) will assist in lowering your blood sugars. Exercise daily at least 30 minutes daily. -     Insulin Glargine (BASAGLAR KWIKPEN) 100 UNIT/ML; Inject 10 Units into the skin daily. -     metFORMIN (GLUCOPHAGE) 1000 MG tablet; Take 1 tablet (1,000 mg total) by mouth 2 (two) times daily with a meal.  Hospital discharge follow-up 1 Follow up with Nathaniel Lamas [place] Cage, NP (Internal Medicine)- continue  2 Follow up with [place] (Rehabilitation)-  3 Continue PT/OT/ST s/p acute CVA-asphasia     Nathaniel Perna, NP 9:47 AM

## 2020-05-18 NOTE — Patient Instructions (Signed)
Diabetes Mellitus and Nutrition, Adult When you have diabetes, or diabetes mellitus, it is very important to have healthy eating habits because your blood sugar (glucose) levels are greatly affected by what you eat and drink. Eating healthy foods in the right amounts, at about the same times every day, can help you:  Control your blood glucose.  Lower your risk of heart disease.  Improve your blood pressure.  Reach or maintain a healthy weight. What can affect my meal plan? Every person with diabetes is different, and each person has different needs for a meal plan. Your health care provider may recommend that you work with a dietitian to make a meal plan that is best for you. Your meal plan may vary depending on factors such as:  The calories you need.  The medicines you take.  Your weight.  Your blood glucose, blood pressure, and cholesterol levels.  Your activity level.  Other health conditions you have, such as heart or kidney disease. How do carbohydrates affect me? Carbohydrates, also called carbs, affect your blood glucose level more than any other type of food. Eating carbs naturally raises the amount of glucose in your blood. Carb counting is a method for keeping track of how many carbs you eat. Counting carbs is important to keep your blood glucose at a healthy level, especially if you use insulin or take certain oral diabetes medicines. It is important to know how many carbs you can safely have in each meal. This is different for every person. Your dietitian can help you calculate how many carbs you should have at each meal and for each snack. How does alcohol affect me? Alcohol can cause a sudden decrease in blood glucose (hypoglycemia), especially if you use insulin or take certain oral diabetes medicines. Hypoglycemia can be a life-threatening condition. Symptoms of hypoglycemia, such as sleepiness, dizziness, and confusion, are similar to symptoms of having too much  alcohol.  Do not drink alcohol if: ? Your health care provider tells you not to drink. ? You are pregnant, may be pregnant, or are planning to become pregnant.  If you drink alcohol: ? Do not drink on an empty stomach. ? Limit how much you use to:  0-1 drink a day for women.  0-2 drinks a day for men. ? Be aware of how much alcohol is in your drink. In the [place] one drink equals one 12 oz bottle of beer (355 mL), one 5 oz glass of wine (148 mL), or one 1 oz glass of hard liquor (44 mL). ? Keep yourself hydrated with water, diet soda, or unsweetened iced tea.  Keep in mind that regular soda, juice, and other mixers may contain a lot of sugar and must be counted as carbs. What are tips for following this plan? Reading food labels  Start by checking the serving size on the "Nutrition Facts" label of packaged foods and drinks. The amount of calories, carbs, fats, and other nutrients listed on the label is based on one serving of the item. Many items contain more than one serving per package.  Check the total grams (g) of carbs in one serving. You can calculate the number of servings of carbs in one serving by dividing the total carbs by 15. For example, if a food has 30 g of total carbs per serving, it would be equal to 2 servings of carbs.  Check the number of grams (g) of saturated fats and trans fats in one serving. Choose foods that have   a low amount or none of these fats.  Check the number of milligrams (mg) of salt (sodium) in one serving. Most people should limit total sodium intake to less than 2,300 mg per day.  Always check the nutrition information of foods labeled as "low-fat" or "nonfat." These foods may be higher in added sugar or refined carbs and should be avoided.  Talk to your dietitian to identify your daily goals for nutrients listed on the label. Shopping  Avoid buying canned, pre-made, or processed foods. These foods tend to be high in fat, sodium, and added  sugar.  Shop around the outside edge of the grocery store. This is where you will most often find fresh fruits and vegetables, bulk grains, fresh meats, and fresh dairy. Cooking  Use low-heat cooking methods, such as baking, instead of high-heat cooking methods like deep frying.  Cook using healthy oils, such as olive, canola, or sunflower oil.  Avoid cooking with butter, cream, or high-fat meats. Meal planning  Eat meals and snacks regularly, preferably at the same times every day. Avoid going long periods of time without eating.  Eat foods that are high in fiber, such as fresh fruits, vegetables, beans, and whole grains. Talk with your dietitian about how many servings of carbs you can eat at each meal.  Eat 4-6 oz (112-168 g) of lean protein each day, such as lean meat, chicken, fish, eggs, or tofu. One ounce (oz) of lean protein is equal to: ? 1 oz (28 g) of meat, chicken, or fish. ? 1 egg. ?  cup (62 g) of tofu.  Eat some foods each day that contain healthy fats, such as avocado, nuts, seeds, and fish.   What foods should I eat? Fruits Berries. Apples. Oranges. Peaches. Apricots. Plums. Grapes. Mango. Papaya. Pomegranate. Kiwi. Cherries. Vegetables Lettuce. Spinach. Leafy greens, including kale, chard, collard greens, and mustard greens. Beets. Cauliflower. Cabbage. Broccoli. Carrots. Green beans. Tomatoes. Peppers. Onions. Cucumbers. Brussels sprouts. Grains Whole grains, such as whole-wheat or whole-grain bread, crackers, tortillas, cereal, and pasta. Unsweetened oatmeal. Quinoa. Brown or wild rice. Meats and other proteins Seafood. Poultry without skin. Lean cuts of poultry and beef. Tofu. Nuts. Seeds. Dairy Low-fat or fat-free dairy products such as milk, yogurt, and cheese. The items listed above may not be a complete list of foods and beverages you can eat. Contact a dietitian for more information. What foods should I avoid? Fruits Fruits canned with  syrup. Vegetables Canned vegetables. Frozen vegetables with butter or cream sauce. Grains Refined white flour and flour products such as bread, pasta, snack foods, and cereals. Avoid all processed foods. Meats and other proteins Fatty cuts of meat. Poultry with skin. Breaded or fried meats. Processed meat. Avoid saturated fats. Dairy Full-fat yogurt, cheese, or milk. Beverages Sweetened drinks, such as soda or iced tea. The items listed above may not be a complete list of foods and beverages you should avoid. Contact a dietitian for more information. Questions to ask a health care provider  Do I need to meet with a diabetes educator?  Do I need to meet with a dietitian?  What number can I call if I have questions?  When are the best times to check my blood glucose? Where to find more information:  American Diabetes Association: diabetes.org  Academy of Nutrition and Dietetics: www.eatright.org  National Institute of Diabetes and Digestive and Kidney Diseases: www.niddk.nih.gov  Association of Diabetes Care and Education Specialists: www.diabeteseducator.org Summary  It is important to have healthy eating   habits because your blood sugar (glucose) levels are greatly affected by what you eat and drink.  A healthy meal plan will help you control your blood glucose and maintain a healthy lifestyle.  Your health care provider may recommend that you work with a dietitian to make a meal plan that is best for you.  Keep in mind that carbohydrates (carbs) and alcohol have immediate effects on your blood glucose levels. It is important to count carbs and to use alcohol carefully. This information is not intended to replace advice given to you by your health care provider. Make sure you discuss any questions you have with your health care provider. Document Revised: 12/10/2018 Document Reviewed: 12/10/2018 Elsevier Patient Education  2021 Elsevier Inc.  

## 2020-05-18 NOTE — Therapy (Signed)
[facility] [address] [place], [place], [address] Phone: [phone]   Fax:  [phone]  Occupational Therapy Treatment  Patient Details  Name: Christpher Stogsdill MRN: [id number] Date of Birth: [birth year] Referring Provider (OT): Mauricio Annett Gula, MD   Encounter Date: 05/18/2020   OT End of Session - 05/18/20 1146    Visit Number 4    Number of Visits 17    Date for OT Re-Evaluation 07/01/20    Authorization Type Self Pay    OT Start Time 1145    OT Stop Time 1225    OT Time Calculation (min) 40 min    Activity Tolerance Patient tolerated treatment well    Behavior During Therapy [facility] for tasks assessed/performed           Past Medical History:  Diagnosis Date  . Hypertension     History reviewed. No pertinent surgical history.  There were no vitals filed for this visit.   Subjective Assessment - 05/18/20 1147    Subjective  Pt denies any pain or changes. "I forgot how to shower this morning"    Pertinent History HTN, Diabetes, Tobacco abuse, cocaine abuse    Limitations aphasia. apraxia. no driving.    Patient Stated Goals "to be able to talk"    Currently in Pain? No/denies            Folding Towels with automatic movements - pt with freezing on 2-3 occasions d/t apraxia but did well with taking extra time and regrouping without cues.   Connect Four simple 2 step pattern - followed pattern with no errors and minimal increased time.  Gross Motor Coordination with tossing medium size ball back and forth, up and catching to self (with no difficulty), 2 step toss with tossing up and clapping in between with increased difficulty and increased time to plan movements. Pt successfully did movement x 4 with cues for tossing big. Tossed small ball back and forth and up in air in one hand, bilaterally, with no difficulty.   Copying Small Peg Design with mod cues. Right inattention evident as patient with  difficulty scanning to the right side of the board.                OT Short Term Goals - 05/11/20 1157      OT SHORT TERM GOAL #1   Title Pt will verbalize understanding of memory compensation strategies    Time 4    Period Weeks    Status On-going    Target Date 06/03/20      OT SHORT TERM GOAL #2   Title Pt will perform environmental scanning with 75% accuracy    Time 4    Period Weeks    Status On-going      OT SHORT TERM GOAL #3   Title Pt will perform simple warm meal prep and/or light housekeeping with min A and good safety awareness    Time 4    Period Weeks    Status On-going      OT SHORT TERM GOAL #4   Title Pt will complete tabletop scanning with 75% accuracy or greater.    Baseline 55% accuracy with 88 cancellation 84M    Time 4    Period Weeks    Status On-going      OT SHORT TERM GOAL #5   Title Continue to assess visual deficits and write goal PRN    Time 4    Period Weeks  Status On-going             OT Long Term Goals - 05/11/20 1200      OT LONG TERM GOAL #1   Title Pt will verbalize understanding of adapted strategies for increasing independence and safety awareness with ADLs and IADLs (i.e. right inattention for cooking, laundry, motor planning for IADLs)    Time 8    Period Weeks    Status On-going      OT LONG TERM GOAL #2   Title Pt perform environmental scanning with a physical component (i.e. tossing ball) with 90% accuracy or greater    Time 8    Period Weeks    Status On-going      OT LONG TERM GOAL #3   Title Pt will perform simple warm meal prep and/or light housekeeping with supervision and good safety awareness.    Time 8    Period Weeks    Status On-going      OT LONG TERM GOAL #4   Title Pt will complete a novel cognitive task with sustained attention for 15 minute sor greater with no cues for redirection.    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 CVA (cerebral vascular accident) (HCC) 04/21/2020  . Aphasia   . Polysubstance abuse (HCC)   . Diabetes mellitus, new onset (HCC)   . Hypokalemia   . Acute CVA (cerebrovascular accident) (HCC) 04/20/2020  . Essential hypertension 08/21/2019  . Obesity 08/21/2019    Junious Dresser 05/18/2020, 12:34 PM  [facility] [address] [place], [place], [address] Phone: [phone]   Fax:  [phone]  Name: Honest Vanleer MRN: [id number] Date of Birth: 03-Jan-1976

## 2020-05-20 ENCOUNTER — Encounter: Payer: Self-pay | Admitting: Occupational Therapy

## 2020-05-20 ENCOUNTER — Other Ambulatory Visit: Payer: Self-pay

## 2020-05-20 ENCOUNTER — Ambulatory Visit: Payer: Medicaid Other | Admitting: Occupational Therapy

## 2020-05-20 ENCOUNTER — Ambulatory Visit: Payer: Medicaid Other

## 2020-05-20 DIAGNOSIS — R41844 Frontal lobe and executive function deficit: Secondary | ICD-10-CM

## 2020-05-20 DIAGNOSIS — R29818 Other symptoms and signs involving the nervous system: Secondary | ICD-10-CM

## 2020-05-20 DIAGNOSIS — R4701 Aphasia: Secondary | ICD-10-CM

## 2020-05-20 DIAGNOSIS — R41840 Attention and concentration deficit: Secondary | ICD-10-CM

## 2020-05-20 DIAGNOSIS — R482 Apraxia: Secondary | ICD-10-CM

## 2020-05-20 DIAGNOSIS — R41842 Visuospatial deficit: Secondary | ICD-10-CM

## 2020-05-20 DIAGNOSIS — I69353 Hemiplegia and hemiparesis following cerebral infarction affecting right non-dominant side: Secondary | ICD-10-CM

## 2020-05-20 NOTE — Therapy (Signed)
[facility] [address] [place], [place], [address] Phone: [phone]   Fax:  [phone]  Speech Language Pathology Treatment  Patient Details  Name: Nathaniel Spears MRN: [id number] Date of Birth: 12/05/1975 Referring Provider (SLP): Arrien, York Ram, MD (Doc= Gwinda Passe NP- PCP)   Encounter Date: 05/20/2020   End of Session - 05/20/20 1222    Visit Number 6    Number of Visits 17    Date for SLP Re-Evaluation 07/26/20    Authorization Type self-pay, [employer] pending?    SLP Start Time 1145    SLP Stop Time  1230    SLP Time Calculation (min) 45 min    Activity Tolerance Patient tolerated treatment well           Past Medical History:  Diagnosis Date  . Hypertension     History reviewed. No pertinent surgical history.  There were no vitals filed for this visit.   Subjective Assessment - 05/20/20 1148    Subjective "shapes and...." re: OT apt    Currently in Pain? No/denies                 ADULT SLP TREATMENT - 05/20/20 1148      General Information   Behavior/Cognition Alert;Cooperative;Pleasant mood      Treatment Provided   Treatment provided Cognitive-Linquistic      Cognitive-Linquistic Treatment   Treatment focused on Aphasia;Apraxia;Dysarthria;Patient/family/caregiver education    Skilled Treatment Pt noted with improved responsive naming to SLP opening questions. SLP targeted convergent naming given choice of 2, in which pt correctly answered with 58% accuracy. Cues were occasionally effective due to significant perseverations. Occasional frustration noted this session, which improved with calming strategies and additional processing time. Apraxic errors x2 noted during structured task. SLP targeted generation of responses given picture stimuli with dialogue box, in which pt able to generate appropriate response with 75% accuracy given min prompting. Pt answered simple close-ended  questions with 83% accuracy with occasional cues.      Assessment / Recommendations / Plan   Plan Continue with current plan of care      Progression Toward Goals   Progression toward goals Progressing toward goals            SLP Education - 05/20/20 1223    Education Details functional practice, calming strategies    Person(s) Educated Patient    Methods Explanation;Demonstration;Handout;Verbal cues    Comprehension Verbalized understanding;Returned demonstration;Need further instruction;Verbal cues required            SLP Short Term Goals - 05/20/20 1222      SLP SHORT TERM GOAL #1   Title Pt will name 10 personally relevant words/phrases with usual min A over 3 sessions    Time 2    Period Weeks    Status On-going      SLP SHORT TERM GOAL #2   Title Pt will verbalize 2-3 word sentences in response to simple conversational dialogue with usual min A over 3 sessions    Baseline 05-20-20    Time 2    Period Weeks    Status On-going      SLP SHORT TERM GOAL #3   Title Pt will use mulitmodal communication (gesture, draw, write 1st letter etc) to augment verbal expression with usual min A over 3 sessions    Time 2    Period Weeks    Status On-going      SLP SHORT TERM GOAL #4  Title Pt will use external visual communication aids to express wants/needs for 4/5 opportunities with usual min A    Time 2    Period Weeks    Status On-going      SLP SHORT TERM GOAL #5   Title Pt/caregiver will complete communication effectivness QOL scale in first 2 sessions    Baseline CES=12 & SF=19    Status Achieved            SLP Long Term Goals - 05/20/20 1223      SLP LONG TERM GOAL #1   Title Pt will verbalize 4-5 word sentences in response to simple open ended questions with usual min A over 2 sessions    Time 6    Period Weeks    Status On-going      SLP LONG TERM GOAL #2   Title Pt will use mulitmodal communication (gesture, draw, write 1st letter etc) to augment  verbal expression to meet needs at home with rare min A from family.    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 SLP targeted responsive naming on structured tasks and in conversation. Occasional perseverations and apraxic errors noted, which resulted in patient frustration. Less frequent semantic cues, phonemic cues, and carrier phrases required to complete structured tasks this session. Pt benefited from additional processing time with occasional self-corrections exhibited. Skilled ST intervention is warranted to address moderate to severe expressive communication deficits impacting patient's ability to participate in simple conversations and communicate wants and needs effectively.    Speech Therapy Frequency 2x / week    Duration 8 weeks   or 17 total visits   Treatment/Interventions Compensatory strategies;Cueing hierarchy;Functional tasks;Patient/family education;Cognitive reorganization;Multimodal communcation approach;Environmental controls;Compensatory techniques;Internal/external aids;SLP instruction and feedback;Language facilitation    Potential to Achieve Goals Fair    Potential Considerations Severity of impairments    SLP Home Exercise Plan provided    Consulted and Agree with Plan of Care Patient           Patient will benefit from skilled therapeutic intervention in order to improve the following deficits and impairments:   Aphasia  Verbal apraxia    Problem List Patient Active Problem List   Diagnosis Date Noted  . CVA (cerebral  vascular accident) (HCC) 04/21/2020  . Aphasia   . Polysubstance abuse (HCC)   . Diabetes mellitus, new onset (HCC)   . Hypokalemia   . Acute CVA (cerebrovascular accident) (HCC) 04/20/2020  . Essential hypertension 08/21/2019  . Obesity 08/21/2019    Janann Colonel, MA CCC-SLP 05/20/2020, 2:20 PM  [facility] [address] [place], [place], [address] Phone: [phone]   Fax:  [phone]   Name: Nathaniel Spears MRN: [id number] Date of Birth: Apr 28, 1975

## 2020-05-20 NOTE — Therapy (Signed)
[facility] [address] [place], [place], [address] Phone: [phone]   Fax:  [phone]  Occupational Therapy Treatment  Patient Details  Name: Nathaniel Spears MRN: [id number] Date of Birth: [birth year] Referring Provider (OT): Mauricio Annett Gula, MD   Encounter Date: 05/20/2020   OT End of Session - 05/20/20 1102    Visit Number 5    Number of Visits 17    Date for OT Re-Evaluation 07/01/20    Authorization Type Self Pay    OT Start Time 1101    OT Stop Time 1145    OT Time Calculation (min) 44 min    Activity Tolerance Patient tolerated treatment well    Behavior During Therapy [facility] for tasks assessed/performed           Past Medical History:  Diagnosis Date  . Hypertension     History reviewed. No pertinent surgical history.  There were no vitals filed for this visit.   Subjective Assessment - 05/20/20 1102    Subjective  Pt denies any pain.    Pertinent History HTN, Diabetes, Tobacco abuse, cocaine abuse    Limitations aphasia. apraxia. no driving.    Patient Stated Goals "to be able to talk"    Currently in Pain? No/denies                Gross Motor with tossing small hacky sack between hands and up in one hand with no difficulty. Pt had increased difficulty with completing with 2 balls and tossing up with both hands simultaneously. Pt completed with min difficulty.   PVC pipe tree pt encouraged to separate pieces prior - pt req'd increased time to separate pieces and sort and min verbal and visual cues. Pt copied figure 4 with max A for getting started on pattern. Once started, patient was able to complete figure with mod difficulty.   ADLs with motor planning and object identification. Pt was able to pour liquid from one cup to another, spoon out beans, identify nail clippers when asked for something to cut nails, etc.   UNO sorting first by color with good attention and carry over  consistently in activity. Pt did not have any freezing during sorting by color in approx 3 minutes duration. Switched category to number and patient with mod difficulty with switching. Pt with freezing x 1 during sorting by number. Mod cues required for recalling category, scanning to right and min cues for attending to task. Pt then put them in sequential order with min verbal and visual cues.              OT Short Term Goals - 05/11/20 1157      OT SHORT TERM GOAL #1   Title Pt will verbalize understanding of memory compensation strategies    Time 4    Period Weeks    Status On-going    Target Date 06/03/20      OT SHORT TERM GOAL #2   Title Pt will perform environmental scanning with 75% accuracy    Time 4    Period Weeks    Status On-going      OT SHORT TERM GOAL #3   Title Pt will perform simple warm meal prep and/or light housekeeping with min A and good safety awareness    Time 4    Period Weeks    Status On-going      OT SHORT TERM GOAL #4   Title Pt will complete tabletop scanning with  75% accuracy or greater.    Baseline 55% accuracy with 88 cancellation 55M    Time 4    Period Weeks    Status On-going      OT SHORT TERM GOAL #5   Title Continue to assess visual deficits and write goal PRN    Time 4    Period Weeks    Status On-going             OT Long Term Goals - 05/11/20 1200      OT LONG TERM GOAL #1   Title Pt will verbalize understanding of adapted strategies for increasing independence and safety awareness with ADLs and IADLs (i.e. right inattention for cooking, laundry, motor planning for IADLs)    Time 8    Period Weeks    Status On-going      OT LONG TERM GOAL #2   Title Pt perform environmental scanning with a physical component (i.e. tossing ball) with 90% accuracy or greater    Time 8    Period Weeks    Status On-going      OT LONG TERM GOAL #3   Title Pt will perform simple warm meal prep and/or light housekeeping with  supervision and good safety awareness.    Time 8    Period Weeks    Status On-going      OT LONG TERM GOAL #4   Title Pt will complete a novel cognitive task with sustained attention for 15 minute sor greater with no cues for redirection.    Baseline difficulty with Trail Making A test and with increased time    Time 8    Period Weeks    Status On-going                 Plan - 05/20/20 1141    Clinical Impression Statement Pt is progressing in therapy. Pt with increased attention to task this day.    OT Occupational Profile and History Problem Focused Assessment - Including review of records relating to presenting problem    Occupational performance deficits (Please refer to evaluation for details): IADL's;ADL's;Leisure;Social Participation    Body Structure / Function / Physical Skills ADL;Coordination;Vision;IADL;UE functional use;Proprioception    Cognitive Skills Attention;Sequencing;Perception;Understand;Memory;Problem Solve;Safety Awareness    Rehab Potential Good    Clinical Decision Making Limited treatment options, no task modification necessary    Comorbidities Affecting Occupational Performance: None    Modification or Assistance to Complete Evaluation  No modification of tasks or assist necessary to complete eval    OT Frequency 2x / week    OT Duration 8 weeks   16 visits   OT Treatment/Interventions Self-care/ADL training;Cognitive remediation/compensation;Visual/perceptual remediation/compensation;Patient/family education;Therapeutic activities;Therapeutic exercise;Functional Mobility Training    Plan environmental scanning, sustained attention - familiar functional tasks    Consulted and Agree with Plan of Care Patient;Family member/caregiver    Family Member Consulted significant other, [name]           Patient will benefit from skilled therapeutic intervention in order to improve the following deficits and impairments:   Body Structure / Function /  Physical Skills: ADL,Coordination,Vision,IADL,UE functional use,Proprioception Cognitive Skills: Attention,Sequencing,Perception,Understand,Memory,Problem Solve,Safety Awareness     Visit Diagnosis: Attention and concentration deficit  Other symptoms and signs involving the nervous system  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  CVA (cerebral vascular accident) (HCC) 04/21/2020  . Aphasia   . Polysubstance abuse (HCC)   . Diabetes mellitus, new onset (HCC)   . Hypokalemia   . Acute CVA (cerebrovascular accident) (HCC) 04/20/2020  . Essential hypertension 08/21/2019  . Obesity 08/21/2019    Junious Dresser MOT, OTR/L  05/20/2020, 11:50 AM  [facility] [address] [place], [place], [address] Phone: [phone]   Fax:  [phone]  Name: Nathaniel Spears MRN: [id number] Date of Birth: [birth year]

## 2020-05-21 ENCOUNTER — Telehealth (INDEPENDENT_AMBULATORY_CARE_PROVIDER_SITE_OTHER): Payer: Self-pay | Admitting: Primary Care

## 2020-05-21 NOTE — Telephone Encounter (Signed)
[name] girlfriend called requesting needles for the insulin that was prescribed this week. Please advise   [facility]  [address] [place] [address]  Phone: [phone] Fax: [phone]

## 2020-05-21 NOTE — Telephone Encounter (Signed)
Please follow up

## 2020-05-23 ENCOUNTER — Other Ambulatory Visit (INDEPENDENT_AMBULATORY_CARE_PROVIDER_SITE_OTHER): Payer: Self-pay | Admitting: Primary Care

## 2020-05-23 DIAGNOSIS — E119 Type 2 diabetes mellitus without complications: Secondary | ICD-10-CM

## 2020-05-23 MED ORDER — TRUEPLUS LANCETS 28G MISC
2 refills | Status: DC
Start: 2020-05-23 — End: 2020-06-23
  Filled 2020-05-23: qty 100, 33d supply, fill #0

## 2020-05-23 NOTE — Telephone Encounter (Signed)
Dr. Alvis Lemmings refilled on 05/18/20 resent

## 2020-05-24 ENCOUNTER — Other Ambulatory Visit (INDEPENDENT_AMBULATORY_CARE_PROVIDER_SITE_OTHER): Payer: Self-pay | Admitting: Primary Care

## 2020-05-24 ENCOUNTER — Other Ambulatory Visit: Payer: Self-pay

## 2020-05-24 DIAGNOSIS — I639 Cerebral infarction, unspecified: Secondary | ICD-10-CM

## 2020-05-24 MED ORDER — CLOPIDOGREL BISULFATE 75 MG PO TABS
75.0000 mg | ORAL_TABLET | Freq: Every day | ORAL | 0 refills | Status: DC
Start: 2020-05-24 — End: 2020-06-23
  Filled 2020-05-24: qty 30, 30d supply, fill #0

## 2020-05-25 ENCOUNTER — Ambulatory Visit: Payer: Medicaid Other | Admitting: Occupational Therapy

## 2020-05-25 ENCOUNTER — Encounter: Payer: Self-pay | Admitting: Occupational Therapy

## 2020-05-25 ENCOUNTER — Ambulatory Visit: Payer: Medicaid Other

## 2020-05-25 ENCOUNTER — Other Ambulatory Visit: Payer: Self-pay

## 2020-05-25 DIAGNOSIS — R41840 Attention and concentration deficit: Secondary | ICD-10-CM

## 2020-05-25 DIAGNOSIS — R41844 Frontal lobe and executive function deficit: Secondary | ICD-10-CM

## 2020-05-25 DIAGNOSIS — R29818 Other symptoms and signs involving the nervous system: Secondary | ICD-10-CM

## 2020-05-25 DIAGNOSIS — R4701 Aphasia: Secondary | ICD-10-CM

## 2020-05-25 DIAGNOSIS — R482 Apraxia: Secondary | ICD-10-CM

## 2020-05-25 DIAGNOSIS — I69353 Hemiplegia and hemiparesis following cerebral infarction affecting right non-dominant side: Secondary | ICD-10-CM

## 2020-05-25 DIAGNOSIS — R41842 Visuospatial deficit: Secondary | ICD-10-CM

## 2020-05-25 NOTE — Therapy (Signed)
[facility] [address] [place], [place], [address] Phone: [phone]   Fax:  [phone]  Speech Language Pathology Treatment  Patient Details  Name: Nathaniel Spears MRN: [id number] Date of Birth: 1975-08-17 Referring Provider (SLP): Arrien, York Ram, MD (Doc= Gwinda Passe NP- PCP)   Encounter Date: 05/25/2020   End of Session - 05/25/20 1014    Visit Number 7    Number of Visits 17    Date for SLP Re-Evaluation 07/26/20    Authorization Type self-pay    SLP Start Time 1015    SLP Stop Time  1100    SLP Time Calculation (min) 45 min    Activity Tolerance Patient tolerated treatment well           Past Medical History:  Diagnosis Date  . Hypertension     History reviewed. No pertinent surgical history.  There were no vitals filed for this visit.   Subjective Assessment - 05/25/20 1015    Subjective "okay"    Currently in Pain? No/denies                 ADULT SLP TREATMENT - 05/25/20 1013      General Information   Behavior/Cognition Alert;Cooperative;Pleasant mood;Requires cueing      Treatment Provided   Treatment provided Cognitive-Linquistic      Cognitive-Linquistic Treatment   Treatment focused on Aphasia;Apraxia;Dysarthria;Patient/family/caregiver education    Skilled Treatment Pt appeared frustrated upon entrance, which impacted his ability to express himself accurately. Pt inaccurately answered yes/no questions x3 in inital conversation, with questioning and clarifying cues required to correct. SLP targeted divergent naming of concrete categories, with usual min-mod semantic cues and occasional phonemic cues required. Apraxic errors noted x2. SLP targeted picture description task for 3 photo sequences, in which pt required usual cues to expand MLU and name subject as well as ID correct photo actually described. Occasional perseverations exhibited, in which pt required new  carrier phrases to correct perseverations.      Assessment / Recommendations / Plan   Plan Continue with current plan of care      Progression Toward Goals   Progression toward goals Progressing toward goals            SLP Education - 05/25/20 1234    Education Details functional practice, compensations    Person(s) Educated Patient    Methods Explanation;Demonstration;Handout;Verbal cues    Comprehension Verbalized understanding;Returned demonstration;Verbal cues required;Need further instruction            SLP Short Term Goals - 05/25/20 1014      SLP SHORT TERM GOAL #1   Title Pt will name 10 personally relevant words/phrases with usual min A over 3 sessions    Baseline 05-25-20    Time 1    Period Weeks    Status On-going      SLP SHORT TERM GOAL #2   Title Pt will verbalize 2-3 word sentences in response to simple conversational dialogue with usual min A over 3 sessions    Baseline 05-20-20    Time 1    Period Weeks    Status On-going      SLP SHORT TERM GOAL #3   Title Pt will use mulitmodal communication (gesture, draw, write 1st letter etc) to augment verbal expression with usual min A over 3 sessions    Time 1    Period Weeks    Status On-going      SLP SHORT TERM GOAL #4  Title Pt will use external visual communication aids to express wants/needs for 4/5 opportunities with usual min A    Time 1    Period Weeks    Status On-going      SLP SHORT TERM GOAL #5   Title Pt/caregiver will complete communication effectivness QOL scale in first 2 sessions    Baseline CES=12 & SF=19    Status Achieved            SLP Long Term Goals - 05/25/20 1014      SLP LONG TERM GOAL #1   Title Pt will verbalize 4-5 word sentences in response to simple open ended questions with usual min A over 2 sessions    Time 5    Period Weeks    Status On-going      SLP LONG TERM GOAL #2   Title Pt will use mulitmodal communication (gesture, draw, write 1st letter etc) to  augment verbal expression to meet needs at home with rare min A from family.    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 SLP targeted divergent naming and responsive naming to picture stimuli. Usual semantic and occasional phonemic cues required for anomia. Rare apraxic errors noted. Pt benefited from additional processing time with occasional self-corrections exhibited. Skilled ST intervention is warranted to address moderate to severe expressive communication deficits impacting patient's ability to participate in simple conversations and communicate wants and needs effectively.    Speech Therapy Frequency 2x / week    Duration 8 weeks   or 17 total visits   Treatment/Interventions Compensatory strategies;Cueing hierarchy;Functional tasks;Patient/family education;Cognitive reorganization;Multimodal communcation approach;Environmental controls;Compensatory techniques;Internal/external aids;SLP instruction and feedback;Language facilitation    Potential to Achieve Goals Fair    Potential Considerations Severity of impairments    SLP Home Exercise Plan provided    Consulted and Agree with Plan of Care Patient           Patient will benefit from skilled therapeutic intervention in order to improve the following deficits and impairments:   Aphasia  Verbal apraxia    Problem List Patient Active Problem List   Diagnosis Date Noted  . CVA (cerebral vascular accident) (HCC) 04/21/2020  . Aphasia   . Polysubstance abuse (HCC)   . Diabetes mellitus, new  onset (HCC)   . Hypokalemia   . Acute CVA (cerebrovascular accident) (HCC) 04/20/2020  . Essential hypertension 08/21/2019  . Obesity 08/21/2019    Janann Colonel, MA CCC-SLP 05/25/2020, 12:38 PM  [facility] [address] [place], [place], [address] Phone: [phone]   Fax:  [phone]   Name: Nathaniel Spears MRN: [id number] Date of Birth: 05-Apr-1975

## 2020-05-25 NOTE — Therapy (Signed)
[facility] [address] [place], [place], [address] Phone: [phone]   Fax:  [phone]  Occupational Therapy Treatment  Patient Details  Name: Nathaniel Spears MRN: [id number] Date of Birth: 11/03/1975 Referring Provider (OT): Mauricio Annett Gula, MD   Encounter Date: 05/25/2020   OT End of Session - 05/25/20 1148    Visit Number 6    Number of Visits 17    Date for OT Re-Evaluation 07/01/20    Authorization Type Self Pay    OT Start Time 1148    OT Stop Time 1230    OT Time Calculation (min) 42 min    Activity Tolerance Patient tolerated treatment well    Behavior During Therapy [facility] for tasks assessed/performed           Past Medical History:  Diagnosis Date  . Hypertension     History reviewed. No pertinent surgical history.  There were no vitals filed for this visit.   Subjective Assessment - 05/25/20 1148    Subjective  Pt denies any pain.    Pertinent History HTN, Diabetes, Tobacco abuse, cocaine abuse    Limitations aphasia. apraxia. no driving.    Patient Stated Goals "to be able to talk"    Currently in Pain? No/denies               Constant Therapy Find Same Symbol level 1 with 86% accuracy and 12.9s response time. Cues for scanning to the right. Errors to the right. Level 2 with 91% accuracy and 27.73s response time.   Pt able to write his first name upon command with no difficulty but unable to write numbers with being called out. When asked to write his birthdate, pt required moderate assistance. Pt asked to trace numbers on a clock that OT drew and patient required hand over hand assistance x 2 for numbers before able to trace numbers. Pt with increased difficulty with completing motor tasks today and processing tasks and activities.   Digital/Analog Clocks pt able to identify 3 correctly with only 6 cards on table.  Pt froze after the initial 3 and unable to continue task and  process needs for finding additional times. OT observed that times that were more difficult had numbers on the right (I.e. 4:30, etc)   Continue to assess visual field vs inattention to right.                   OT Short Term Goals - 05/11/20 1157      OT SHORT TERM GOAL #1   Title Pt will verbalize understanding of memory compensation strategies    Time 4    Period Weeks    Status On-going    Target Date 06/03/20      OT SHORT TERM GOAL #2   Title Pt will perform environmental scanning with 75% accuracy    Time 4    Period Weeks    Status On-going      OT SHORT TERM GOAL #3   Title Pt will perform simple warm meal prep and/or light housekeeping with min A and good safety awareness    Time 4    Period Weeks    Status On-going      OT SHORT TERM GOAL #4   Title Pt will complete tabletop scanning with 75% accuracy or greater.    Baseline 55% accuracy with 88 cancellation 37M    Time 4    Period Weeks    Status On-going  OT SHORT TERM GOAL #5   Title Continue to assess visual deficits and write goal PRN    Time 4    Period Weeks    Status On-going             OT Long Term Goals - 05/11/20 1200      OT LONG TERM GOAL #1   Title Pt will verbalize understanding of adapted strategies for increasing independence and safety awareness with ADLs and IADLs (i.e. right inattention for cooking, laundry, motor planning for IADLs)    Time 8    Period Weeks    Status On-going      OT LONG TERM GOAL #2   Title Pt perform environmental scanning with a physical component (i.e. tossing ball) with 90% accuracy or greater    Time 8    Period Weeks    Status On-going      OT LONG TERM GOAL #3   Title Pt will perform simple warm meal prep and/or light housekeeping with supervision and good safety awareness.    Time 8    Period Weeks    Status On-going      OT LONG TERM GOAL #4   Title Pt will complete a novel cognitive task with sustained attention for 15  minute sor greater with no cues for redirection.    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 CVA (cerebral vascular accident) (HCC) 04/21/2020  . Aphasia   . Polysubstance abuse (HCC)   . Diabetes mellitus, new onset (HCC)   . Hypokalemia   . Acute CVA (cerebrovascular accident) (HCC) 04/20/2020  . Essential hypertension 08/21/2019  . Obesity 08/21/2019    Junious Dresser 05/25/2020, 11:50 AM  [facility] [address] [place], [place], [address] Phone: [phone]   Fax:  [phone]  Name: Nathaniel Spears MRN: [id number] Date of Birth: 1975/07/23

## 2020-05-25 NOTE — Patient Instructions (Signed)
  Remember to bring back your homework so you can review it with SLP

## 2020-05-27 ENCOUNTER — Ambulatory Visit: Payer: Medicaid Other

## 2020-05-27 ENCOUNTER — Other Ambulatory Visit: Payer: Self-pay

## 2020-05-27 ENCOUNTER — Ambulatory Visit: Payer: Medicaid Other | Admitting: Occupational Therapy

## 2020-05-27 ENCOUNTER — Encounter: Payer: Self-pay | Admitting: Occupational Therapy

## 2020-05-27 DIAGNOSIS — I69353 Hemiplegia and hemiparesis following cerebral infarction affecting right non-dominant side: Secondary | ICD-10-CM

## 2020-05-27 DIAGNOSIS — R41844 Frontal lobe and executive function deficit: Secondary | ICD-10-CM

## 2020-05-27 DIAGNOSIS — R41840 Attention and concentration deficit: Secondary | ICD-10-CM

## 2020-05-27 DIAGNOSIS — R482 Apraxia: Secondary | ICD-10-CM

## 2020-05-27 DIAGNOSIS — R4701 Aphasia: Secondary | ICD-10-CM | POA: Diagnosis not present

## 2020-05-27 DIAGNOSIS — R41842 Visuospatial deficit: Secondary | ICD-10-CM

## 2020-05-27 NOTE — Therapy (Signed)
[facility] [address] [place], [place], [address] Phone: [phone]   Fax:  [phone]  Speech Language Pathology Treatment  Patient Details  Name: Nathaniel Spears MRN: [id number] Date of Birth: April 12, 1975 Referring Provider (SLP): Arrien, Jimmy Picket, MD (Doc= Juluis Mire NP- PCP)   Encounter Date: 05/27/2020   End of Session - 05/27/20 1602    Visit Number 8    Number of Visits 17    Date for SLP Re-Evaluation 07/26/20    Authorization Type self-pay    SLP Start Time 1450    SLP Stop Time  1530    SLP Time Calculation (min) 40 min    Activity Tolerance Patient tolerated treatment well           Past Medical History:  Diagnosis Date  . Hypertension     History reviewed. No pertinent surgical history.  There were no vitals filed for this visit.   Subjective Assessment - 05/27/20 1445    Subjective "Um - [name]." (re: SLP question who was in the lobby with him)    Currently in Pain? No/denies                 ADULT SLP TREATMENT - 05/27/20 1451      General Information   Behavior/Cognition Alert;Cooperative;Pleasant mood;Requires cueing      Treatment Provided   Treatment provided Cognitive-Linquistic      Cognitive-Linquistic Treatment   Treatment focused on Aphasia;Apraxia;Dysarthria    Skilled Treatment SLP asked pt to categorize 12 cards - after 90 seconds pt without any deliniation of categories. SLP provided the category names again, x2, in 30 seconds and pt without any notable changes in categorizing. SLP then handed each card to pt and with inital min cues pt categorized drinks, fruits, adn vegetables, fading to indpendence. Diagnositic therapy - SLP asked pt what he liked to drink- pt did not use any compensations so SLP encouraged him to do so an dhe wrote with extra time "H2O". SLP congratulated pt on his means of communication and encouraged this for the future. SLP then  used these same item cards for confrontation naming at word level - using familiarity of the cards to hopefully foster accurate naming. Pt with 45% success with naming spontaneously - pt incr'd success with mod cues from SLP and to 95% with max cues. Spelling cues and phonemic cues were more successful than semantic cues. Pt noted with mostly semantic errors (e.g., "calcium" for "milk", "greens" for "peas") but also pausing and hesitation - assumed due to pt knowing he was going to produce apraxic errors. SLP then attempted to have pt write names of vegetables he just said x3, and organized into categories. Pt unable to do so  and did not use any compensations. SLP wrote initial letters and wrote 3/5 independently and the remaining two with mod-max cues. homework to circle items which belong in certain categories.      Assessment / Recommendations / Plan   Plan Continue with current plan of care;Goals updated      Progression Toward Goals   Progression toward goals Progressing toward goals              SLP Short Term Goals - 05/27/20 1603      SLP SHORT TERM GOAL #1   Title Pt will name 10 personally relevant words/phrases with usual min A over 3 sessions    Baseline 05-25-20    Status Partially Met  SLP SHORT TERM GOAL #2   Title Pt will verbalize 2-3 word sentences in response to simple conversational dialogue with usual min A over 3 sessions    Baseline 05-20-20    Status Partially Met      SLP SHORT TERM GOAL #3   Title Pt will use mulitmodal communication (gesture, draw, write 1st letter etc) to augment verbal expression with usual min A over 3 sessions    Status Not Met      SLP SHORT TERM GOAL #4   Title Pt will use external visual communication aids to express wants/needs for 4/5 opportunities with usual min A    Status Not Met      SLP SHORT TERM GOAL #5   Title Pt/caregiver will complete communication effectivness QOL scale in first 2 sessions    Baseline CES=12 & SF=19     Status Achieved            SLP Long Term Goals - 05/27/20 1604      SLP LONG TERM GOAL #1   Title Pt will verbalize 3-4 word sentences in response to simple open ended questions with usual min A over 2 sessions    Time 5    Period Weeks    Status Revised      SLP LONG TERM GOAL #2   Title Pt will use mulitmodal communication (gesture, draw, write 1st letter etc) to augment verbal expression to meet needs at home with occasional min A from family.    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 SLP targeted naming to picture stimuli.  Pt benefited from additional processing time with SLP cues necessary usually. See pt's LTGs for revised goals. Skilled ST intervention is warranted to address moderate to severe expressive communication deficits impacting patient's ability to participate in simple conversations and communicate wants and needs effectively.    Speech Therapy Frequency 2x / week    Duration 8 weeks   or 17 total visits   Treatment/Interventions Compensatory strategies;Cueing hierarchy;Functional tasks;Patient/family education;Cognitive reorganization;Multimodal communcation approach;Environmental controls;Compensatory techniques;Internal/external aids;SLP instruction and feedback;Language facilitation    Potential to Achieve Goals Fair    Potential Considerations Severity of impairments    SLP Home Exercise Plan provided    Consulted and Agree with Plan of Care Patient           Patient will benefit from skilled therapeutic intervention  in order to improve the following deficits and impairments:   Aphasia  Verbal apraxia    Problem List Patient Active Problem List   Diagnosis Date Noted  . CVA (cerebral vascular accident) ([facility]) 04/21/2020  . Aphasia   . Polysubstance abuse ([facility])   . Diabetes mellitus, new onset ([facility])   . Hypokalemia   . Acute CVA (cerebrovascular accident) ([facility]) 04/20/2020  . Essential hypertension 08/21/2019  . Obesity 08/21/2019    [place] ,[place], Elaine  05/27/2020, 4:06 PM  [facility] [phone]  [place], [place], [address] Phone: [phone]   Fax:  [phone]   Name: Nathaniel Spears MRN: [id number] Date of Birth: 1975/08/25

## 2020-05-27 NOTE — Therapy (Deleted)
[facility] [address] [place], [place], [address] Phone: [phone]   Fax:  [phone]  Patient Details  Name: Nathaniel Spears MRN: [id number] Date of Birth: [birth year] Referring Provider:  Grayce Sessions, NP  Encounter Date: 05/27/2020   [facility] 05/27/2020, 12:30 PM  [facility] [address] [place], [place], [address] Phone: [phone]   Fax:  [phone]

## 2020-05-27 NOTE — Patient Instructions (Signed)
  Please complete the assigned speech therapy homework prior to your next session and return it to the speech therapist at your next visit.  

## 2020-05-27 NOTE — Therapy (Addendum)
[facility] [address] [place], [place], [address] Phone: [phone]   Fax:  [phone]  Occupational Therapy Treatment  Patient Details  Name: Nathaniel Spears MRN: [id number] Date of Birth: [birth year] Referring Provider (OT): Mauricio Annett Gula, MD   Encounter Date: 05/27/2020   OT End of Session - 05/27/20 1535    Visit Number 7    Number of Visits 17    Date for OT Re-Evaluation 07/01/20    Authorization Type Self Pay    OT Start Time 1534    OT Stop Time 1615    OT Time Calculation (min) 41 min    Activity Tolerance Patient tolerated treatment well    Behavior During Therapy [facility] for tasks assessed/performed           Past Medical History:  Diagnosis Date  . Hypertension     History reviewed. No pertinent surgical history.  There were no vitals filed for this visit.   Subjective Assessment - 05/27/20 1536    Subjective  Pt denies any pain or changes.    Pertinent History HTN, Diabetes, Tobacco abuse, cocaine abuse    Limitations aphasia. apraxia. no driving.    Patient Stated Goals "to be able to talk"    Currently in Pain? No/denies             Environmental Scanning with 8/16 accuracy = 50% on first pass. OT gave patient cue of missed cards on right- pt found 6/8 remaining numbers on second pass and the remaining 2 on the third pass.  All errors were on right.  Perfection Board with mod assistance and cues for scanning the entire board and cues for "top row", etc. Pt consistently missing ones on the right. Pt responded well to cues for scanning to the right but with limited carryover.   ADLs folding laundry and automatic and daily task. Pt had max difficulty at first with motor planning for folding items (hoodie) but with increased skill and planning with tshirts and towels and pillow cases as he got going with activity.  Tabletop Scanning (double numbers 1.51M) with approx 70% accuracy - pt  with most errors on right but some on left as well.                  OT Education - 05/27/20 1618    Education Details Education on right inattention and placing items on right, sitting to right side of patient, etc.    Person(s) Educated Patient;Spouse   Sig other - [name]   Methods Explanation    Comprehension Verbalized understanding;Need further instruction            OT Short Term Goals - 05/11/20 1157      OT SHORT TERM GOAL #1   Title Pt will verbalize understanding of memory compensation strategies    Time 4    Period Weeks    Status On-going    Target Date 06/03/20      OT SHORT TERM GOAL #2   Title Pt will perform environmental scanning with 75% accuracy    Time 4    Period Weeks    Status On-going      OT SHORT TERM GOAL #3   Title Pt will perform simple warm meal prep and/or light housekeeping with min A and good safety awareness    Time 4    Period Weeks    Status On-going      OT SHORT TERM GOAL #4  Title Pt will complete tabletop scanning with 75% accuracy or greater.    Baseline 55% accuracy with 88 cancellation 51M    Time 4    Period Weeks    Status On-going      OT SHORT TERM GOAL #5   Title Continue to assess visual deficits and write goal PRN    Time 4    Period Weeks    Status On-going             OT Long Term Goals - 05/11/20 1200      OT LONG TERM GOAL #1   Title Pt will verbalize understanding of adapted strategies for increasing independence and safety awareness with ADLs and IADLs (i.e. right inattention for cooking, laundry, motor planning for IADLs)    Time 8    Period Weeks    Status On-going      OT LONG TERM GOAL #2   Title Pt perform environmental scanning with a physical component (i.e. tossing ball) with 90% accuracy or greater    Time 8    Period Weeks    Status On-going      OT LONG TERM GOAL #3   Title Pt will perform simple warm meal prep and/or light housekeeping with supervision and good safety  awareness.    Time 8    Period Weeks    Status On-going      OT LONG TERM GOAL #4   Title Pt will complete a novel cognitive task with sustained attention for 15 minute sor greater with no cues for redirection.    Baseline difficulty with Trail Making A test and with increased time    Time 8    Period Weeks    Status On-going                 Plan - 05/27/20 1550    Clinical Impression Statement Pt with significant right inattention this day. Continue to work on visual scanning and attention to right.    OT Occupational Profile and History Problem Focused Assessment - Including review of records relating to presenting problem    Occupational performance deficits (Please refer to evaluation for details): IADL's;ADL's;Leisure;Social Participation    Body Structure / Function / Physical Skills ADL;Coordination;Vision;IADL;UE functional use;Proprioception    Cognitive Skills Attention;Sequencing;Perception;Understand;Memory;Problem Solve;Safety Awareness    Rehab Potential Good    Clinical Decision Making Limited treatment options, no task modification necessary    Comorbidities Affecting Occupational Performance: None    Modification or Assistance to Complete Evaluation  No modification of tasks or assist necessary to complete eval    OT Frequency 2x / week    OT Duration 8 weeks   16 visits   OT Treatment/Interventions Self-care/ADL training;Cognitive remediation/compensation;Visual/perceptual remediation/compensation;Patient/family education;Therapeutic activities;Therapeutic exercise;Functional Mobility Training    Plan environmental scanning, sustained attention - familiar functional tasks, visual scanning to right    Consulted and Agree with Plan of Care Patient;Family member/caregiver    Family Member Consulted significant other, [name]           Patient will benefit from skilled therapeutic intervention in order to improve the following deficits and impairments:   Body  Structure / Function / Physical Skills: ADL,Coordination,Vision,IADL,UE functional use,Proprioception Cognitive Skills: Attention,Sequencing,Perception,Understand,Memory,Problem Solve,Safety Awareness     Visit Diagnosis: Visuospatial deficit  Frontal lobe and executive function deficit  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)  Attention and concentration deficit  Apraxia    Problem List Patient Active Problem List  Diagnosis Date Noted  . CVA (cerebral vascular accident) ([facility]) 04/21/2020  . Aphasia   . Polysubstance abuse ([facility])   . Diabetes mellitus, new onset ([facility])   . Hypokalemia   . Acute CVA (cerebrovascular accident) ([facility]) 04/20/2020  . Essential hypertension 08/21/2019  . Obesity 08/21/2019    Junious Dresser MOT, OTR/L  05/27/2020, 4:20 PM  [facility] [address] [place], [place], [address] Phone: [phone]   Fax:  [phone]  Name: Aarik Blank MRN: [id number] Date of Birth: 1975/11/16

## 2020-05-30 ENCOUNTER — Emergency Department (HOSPITAL_COMMUNITY): Payer: Medicaid Other

## 2020-05-30 ENCOUNTER — Inpatient Hospital Stay (HOSPITAL_COMMUNITY)
Admission: EM | Admit: 2020-05-30 | Discharge: 2020-06-03 | DRG: 065 | Disposition: A | Discharge status: Home Health | Payer: Medicaid Other | Attending: Internal Medicine | Admitting: Internal Medicine

## 2020-05-30 ENCOUNTER — Other Ambulatory Visit: Payer: Self-pay

## 2020-05-30 DIAGNOSIS — E785 Hyperlipidemia, unspecified: Secondary | ICD-10-CM | POA: Diagnosis present

## 2020-05-30 DIAGNOSIS — R4586 Emotional lability: Secondary | ICD-10-CM | POA: Diagnosis present

## 2020-05-30 DIAGNOSIS — Z79899 Other long term (current) drug therapy: Secondary | ICD-10-CM | POA: Diagnosis not present

## 2020-05-30 DIAGNOSIS — Z6833 Body mass index (BMI) 33.0-33.9, adult: Secondary | ICD-10-CM | POA: Diagnosis not present

## 2020-05-30 DIAGNOSIS — G8194 Hemiplegia, unspecified affecting left nondominant side: Secondary | ICD-10-CM | POA: Diagnosis present

## 2020-05-30 DIAGNOSIS — R29703 NIHSS score 3: Secondary | ICD-10-CM | POA: Diagnosis present

## 2020-05-30 DIAGNOSIS — R4182 Altered mental status, unspecified: Secondary | ICD-10-CM | POA: Diagnosis present

## 2020-05-30 DIAGNOSIS — I69322 Dysarthria following cerebral infarction: Secondary | ICD-10-CM

## 2020-05-30 DIAGNOSIS — E1165 Type 2 diabetes mellitus with hyperglycemia: Secondary | ICD-10-CM | POA: Diagnosis not present

## 2020-05-30 DIAGNOSIS — E6609 Other obesity due to excess calories: Secondary | ICD-10-CM | POA: Diagnosis not present

## 2020-05-30 DIAGNOSIS — I69320 Aphasia following cerebral infarction: Secondary | ICD-10-CM | POA: Diagnosis not present

## 2020-05-30 DIAGNOSIS — E669 Obesity, unspecified: Secondary | ICD-10-CM | POA: Diagnosis not present

## 2020-05-30 DIAGNOSIS — I10 Essential (primary) hypertension: Secondary | ICD-10-CM | POA: Diagnosis not present

## 2020-05-30 DIAGNOSIS — I69390 Apraxia following cerebral infarction: Secondary | ICD-10-CM

## 2020-05-30 DIAGNOSIS — I639 Cerebral infarction, unspecified: Secondary | ICD-10-CM | POA: Diagnosis present

## 2020-05-30 DIAGNOSIS — D509 Iron deficiency anemia, unspecified: Secondary | ICD-10-CM | POA: Diagnosis not present

## 2020-05-30 DIAGNOSIS — Z87891 Personal history of nicotine dependence: Secondary | ICD-10-CM | POA: Diagnosis not present

## 2020-05-30 DIAGNOSIS — F17200 Nicotine dependence, unspecified, uncomplicated: Secondary | ICD-10-CM | POA: Diagnosis not present

## 2020-05-30 DIAGNOSIS — Z8673 Personal history of transient ischemic attack (TIA), and cerebral infarction without residual deficits: Secondary | ICD-10-CM | POA: Diagnosis not present

## 2020-05-30 DIAGNOSIS — Z6835 Body mass index (BMI) 35.0-35.9, adult: Secondary | ICD-10-CM | POA: Diagnosis not present

## 2020-05-30 DIAGNOSIS — I6359 Cerebral infarction due to unspecified occlusion or stenosis of other cerebral artery: Secondary | ICD-10-CM | POA: Diagnosis not present

## 2020-05-30 DIAGNOSIS — Z7982 Long term (current) use of aspirin: Secondary | ICD-10-CM

## 2020-05-30 DIAGNOSIS — I672 Cerebral atherosclerosis: Secondary | ICD-10-CM | POA: Diagnosis not present

## 2020-05-30 DIAGNOSIS — Z7984 Long term (current) use of oral hypoglycemic drugs: Secondary | ICD-10-CM | POA: Diagnosis not present

## 2020-05-30 DIAGNOSIS — E119 Type 2 diabetes mellitus without complications: Secondary | ICD-10-CM

## 2020-05-30 DIAGNOSIS — R4781 Slurred speech: Secondary | ICD-10-CM | POA: Diagnosis not present

## 2020-05-30 DIAGNOSIS — R29810 Facial weakness: Secondary | ICD-10-CM | POA: Diagnosis not present

## 2020-05-30 DIAGNOSIS — F1410 Cocaine abuse, uncomplicated: Secondary | ICD-10-CM | POA: Diagnosis not present

## 2020-05-30 DIAGNOSIS — F1411 Cocaine abuse, in remission: Secondary | ICD-10-CM | POA: Diagnosis not present

## 2020-05-30 DIAGNOSIS — R0902 Hypoxemia: Secondary | ICD-10-CM | POA: Diagnosis not present

## 2020-05-30 DIAGNOSIS — E7800 Pure hypercholesterolemia, unspecified: Secondary | ICD-10-CM | POA: Diagnosis not present

## 2020-05-30 DIAGNOSIS — Z20822 Contact with and (suspected) exposure to covid-19: Secondary | ICD-10-CM | POA: Diagnosis present

## 2020-05-30 DIAGNOSIS — Z794 Long term (current) use of insulin: Secondary | ICD-10-CM

## 2020-05-30 DIAGNOSIS — Z7902 Long term (current) use of antithrombotics/antiplatelets: Secondary | ICD-10-CM | POA: Diagnosis not present

## 2020-05-30 DIAGNOSIS — R4789 Other speech disturbances: Secondary | ICD-10-CM | POA: Diagnosis present

## 2020-05-30 DIAGNOSIS — E876 Hypokalemia: Secondary | ICD-10-CM | POA: Diagnosis present

## 2020-05-30 LAB — DIFFERENTIAL
Abs Immature Granulocytes: 0.02 10*3/uL (ref 0.00–0.07)
Basophils Absolute: 0 10*3/uL (ref 0.0–0.1)
Basophils Relative: 0 %
Eosinophils Absolute: 0.1 10*3/uL (ref 0.0–0.5)
Eosinophils Relative: 2 %
Immature Granulocytes: 0 %
Lymphocytes Relative: 23 %
Lymphs Abs: 1.7 10*3/uL (ref 0.7–4.0)
Monocytes Absolute: 0.6 10*3/uL (ref 0.1–1.0)
Monocytes Relative: 8 %
Neutro Abs: 5 10*3/uL (ref 1.7–7.7)
Neutrophils Relative %: 67 %

## 2020-05-30 LAB — CBC
HCT: 38.7 % — ABNORMAL LOW (ref 39.0–52.0)
Hemoglobin: 12.7 g/dL — ABNORMAL LOW (ref 13.0–17.0)
MCH: 26.1 pg (ref 26.0–34.0)
MCHC: 32.8 g/dL (ref 30.0–36.0)
MCV: 79.5 fL — ABNORMAL LOW (ref 80.0–100.0)
Platelets: 175 10*3/uL (ref 150–400)
RBC: 4.87 MIL/uL (ref 4.22–5.81)
RDW: 12.5 % (ref 11.5–15.5)
WBC: 7.5 10*3/uL (ref 4.0–10.5)
nRBC: 0 % (ref 0.0–0.2)

## 2020-05-30 LAB — TROPONIN I (HIGH SENSITIVITY): Troponin I (High Sensitivity): 6 ng/L (ref <18)

## 2020-05-30 LAB — ETHANOL: Alcohol, Ethyl (B): <10 mg/dL (ref <10)

## 2020-05-30 LAB — I-STAT CHEM 8, ED
BUN: 17 mg/dL (ref 6–20)
Calcium, Ion: 1.11 mmol/L — ABNORMAL LOW (ref 1.15–1.40)
Chloride: 99 mmol/L (ref 98–111)
Creatinine, Ser: 1.5 mg/dL — ABNORMAL HIGH (ref 0.61–1.24)
Glucose, Bld: 215 mg/dL — ABNORMAL HIGH (ref 70–99)
HCT: 37 % — ABNORMAL LOW (ref 39.0–52.0)
Hemoglobin: 12.6 g/dL — ABNORMAL LOW (ref 13.0–17.0)
Potassium: 3.4 mmol/L — ABNORMAL LOW (ref 3.5–5.1)
Sodium: 142 mmol/L (ref 135–145)
TCO2: 29 mmol/L (ref 22–32)

## 2020-05-30 LAB — URINALYSIS, ROUTINE W REFLEX MICROSCOPIC
Bacteria, UA: NONE SEEN
Bilirubin Urine: NEGATIVE
Glucose, UA: >=500 mg/dL — AB
Hgb urine dipstick: NEGATIVE
Ketones, ur: NEGATIVE mg/dL
Leukocytes,Ua: NEGATIVE
Nitrite: NEGATIVE
Protein, ur: NEGATIVE mg/dL
Specific Gravity, Urine: 1.016 (ref 1.005–1.030)
pH: 7 (ref 5.0–8.0)

## 2020-05-30 LAB — RESP PANEL BY RT-PCR (FLU A&B, COVID) ARPGX2
Influenza A by PCR: NEGATIVE
Influenza B by PCR: NEGATIVE
SARS Coronavirus 2 by RT PCR: NEGATIVE

## 2020-05-30 LAB — COMPREHENSIVE METABOLIC PANEL
ALT: 14 U/L (ref 0–44)
AST: 14 U/L — ABNORMAL LOW (ref 15–41)
Albumin: 3.7 g/dL (ref 3.5–5.0)
Alkaline Phosphatase: 69 U/L (ref 38–126)
Anion gap: 6 (ref 5–15)
BUN: 19 mg/dL (ref 6–20)
CO2: 31 mmol/L (ref 22–32)
Calcium: 8.4 mg/dL — ABNORMAL LOW (ref 8.9–10.3)
Chloride: 103 mmol/L (ref 98–111)
Creatinine, Ser: 1.5 mg/dL — ABNORMAL HIGH (ref 0.61–1.24)
GFR, Estimated: 58 mL/min — ABNORMAL LOW (ref >60)
Glucose, Bld: 221 mg/dL — ABNORMAL HIGH (ref 70–99)
Potassium: 3.4 mmol/L — ABNORMAL LOW (ref 3.5–5.1)
Sodium: 140 mmol/L (ref 135–145)
Total Bilirubin: 0.4 mg/dL (ref 0.3–1.2)
Total Protein: 7.3 g/dL (ref 6.5–8.1)

## 2020-05-30 LAB — RAPID URINE DRUG SCREEN, HOSP PERFORMED
Amphetamines: NOT DETECTED
Barbiturates: NOT DETECTED
Benzodiazepines: NOT DETECTED
Cocaine: NOT DETECTED
Opiates: NOT DETECTED
Tetrahydrocannabinol: NOT DETECTED

## 2020-05-30 LAB — BLOOD GAS, VENOUS
Acid-Base Excess: 4.9 mmol/L — ABNORMAL HIGH (ref 0.0–2.0)
Bicarbonate: 30 mmol/L — ABNORMAL HIGH (ref 20.0–28.0)
O2 Saturation: 65.1 %
Patient temperature: 98.6
pCO2, Ven: 48.7 mmHg (ref 44.0–60.0)
pH, Ven: 7.406 (ref 7.250–7.430)
pO2, Ven: 35.1 mmHg (ref 32.0–45.0)

## 2020-05-30 LAB — APTT: aPTT: 30 seconds (ref 24–36)

## 2020-05-30 LAB — PROTIME-INR
INR: 1.1 (ref 0.8–1.2)
Prothrombin Time: 13.8 seconds (ref 11.4–15.2)

## 2020-05-30 LAB — CBG MONITORING, ED: Glucose-Capillary: 123 mg/dL — ABNORMAL HIGH (ref 70–99)

## 2020-05-30 LAB — AMMONIA: Ammonia: 38 umol/L — ABNORMAL HIGH (ref 9–35)

## 2020-05-30 IMAGING — MR MR HEAD W/O CM
10 of 11 series · 38 of 48 positions shown · non-contrast
Comparison: Noncontrast head CT performed earlier today [DATE].
CT angiogram head/neck [DATE]. Brain MRI [DATE].

CLINICAL DATA: Mental status change, unknown cause.

EXAM:
MRI HEAD WITHOUT CONTRAST
TECHNIQUE: Multiplanar, multiecho pulse sequences of the brain and surrounding
structures were obtained without intravenous contrast.

[Series 5: dwi_tracew · axial · 3.0mm · 1.08mm/px · z∈[-57,+91]mm · 8 of 102 slices shown]
[im 1/102]
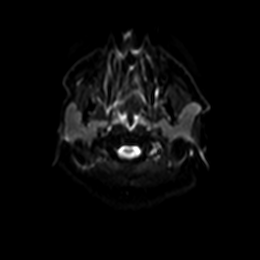
[im 15/102]
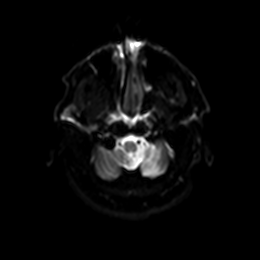
[im 29/102]
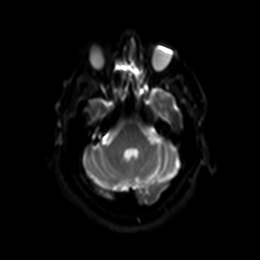
[im 44/102]
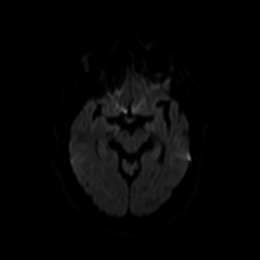
[im 58/102]
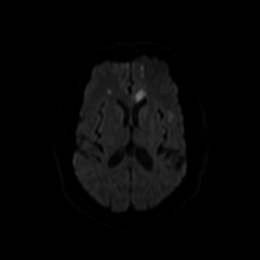
[im 73/102]
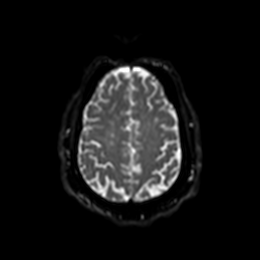
[im 87/102]
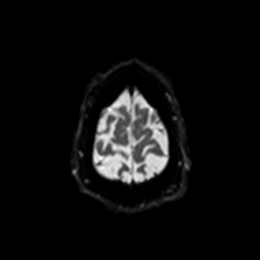
[im 102/102]
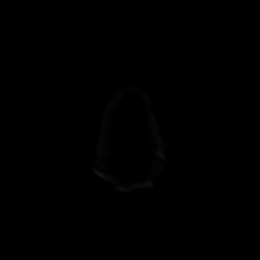

[Series 6: dwi_adc · axial · 3.0mm · 1.08mm/px · z∈[-57,+91]mm · 4 of 51 slices shown]
[im 1/51]
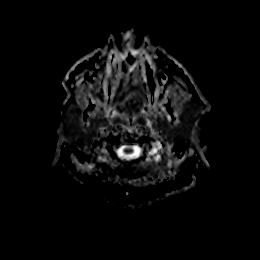
[im 17/51]
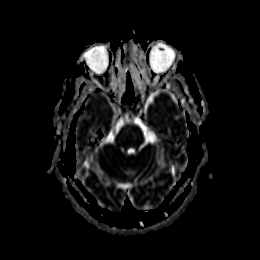
[im 34/51]
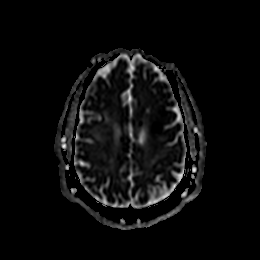
[im 51/51]
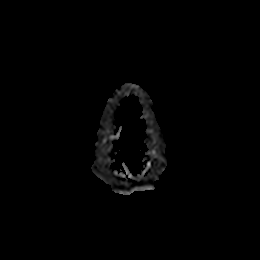

[Series 7: T2 · sagittal · 5.0mm · 0.47mm/px · 2 of 26 slices shown (1 of 3)]
[im 1/26]
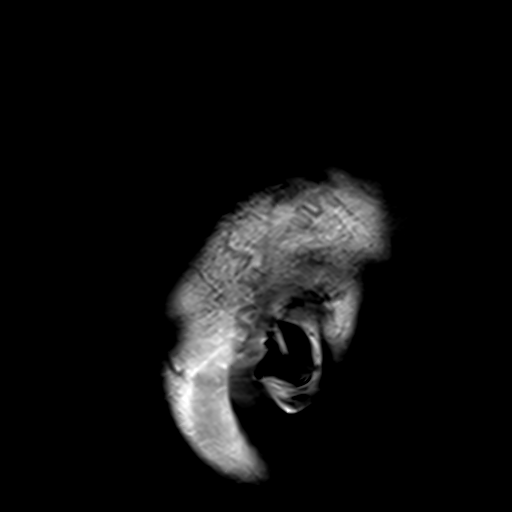
[im 26/26]
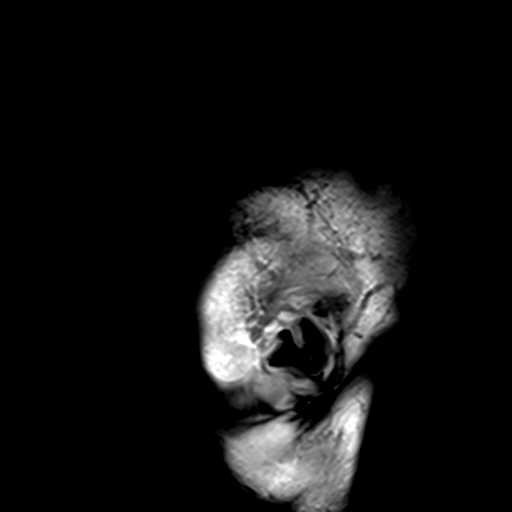

[Series 8: T2 · axial · 5.0mm · 0.47mm/px · z∈[-53,+101]mm · 2 of 25 slices shown (2 of 3)]
[im 1/25]
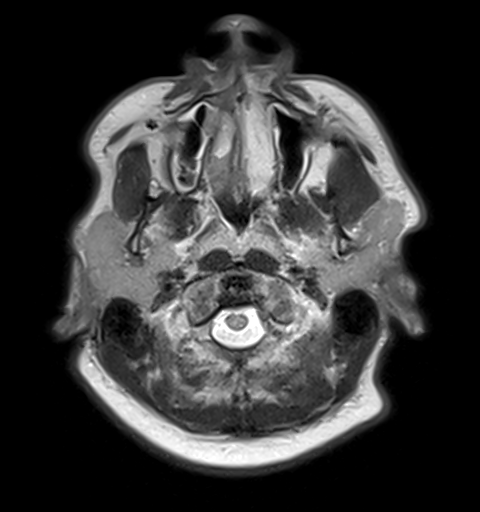
[im 25/25]
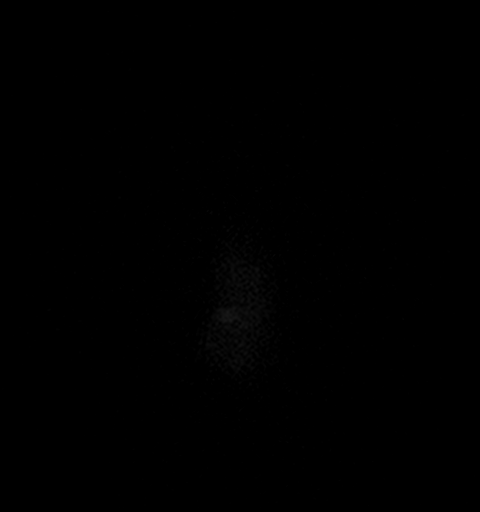

[Series 9: GRE · axial · 3.0mm · 0.45mm/px · z∈[-61,+87]mm · 4 of 51 slices shown]
[im 1/51]
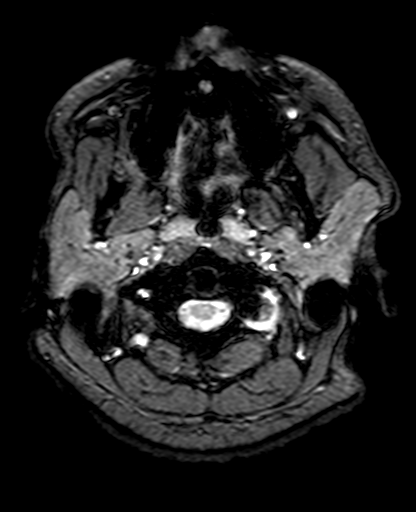
[im 17/51]
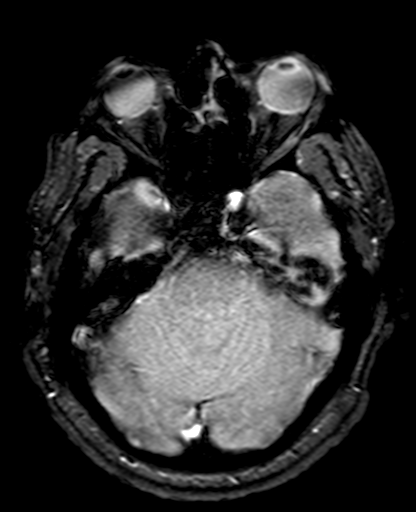
[im 34/51]
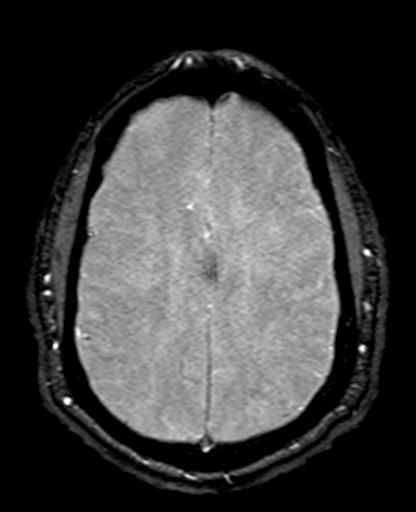
[im 51/51]
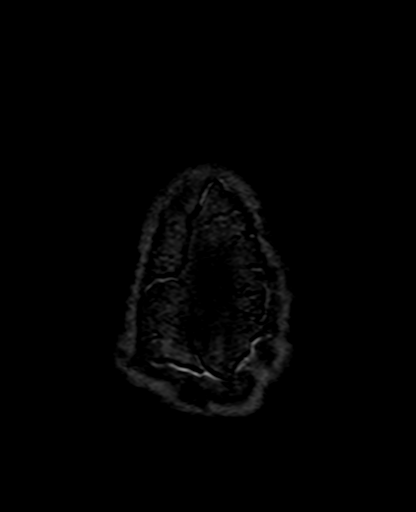

[Series 10: FLAIR · axial · 3.0mm · 0.86mm/px · z∈[-52,+96]mm · 4 of 51 slices shown]
[im 1/51]
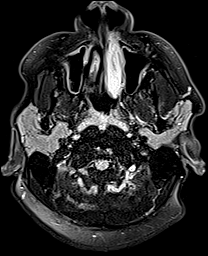
[im 17/51]
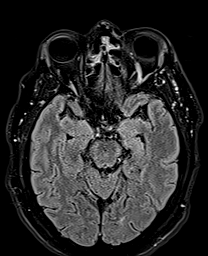
[im 34/51]
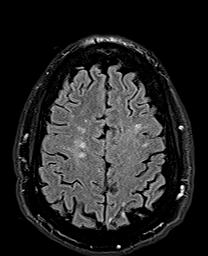
[im 51/51]
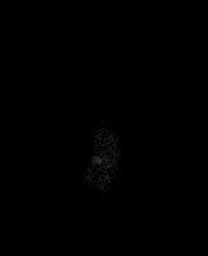

[Series 11: T1 · axial · 3.0mm · 0.45mm/px · z∈[-61,+87]mm · 4 of 51 slices shown]
[im 1/51]
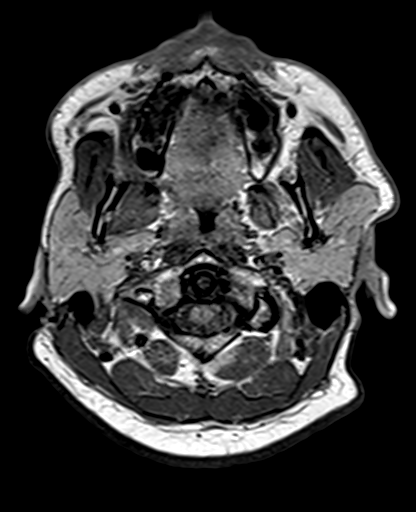
[im 17/51]
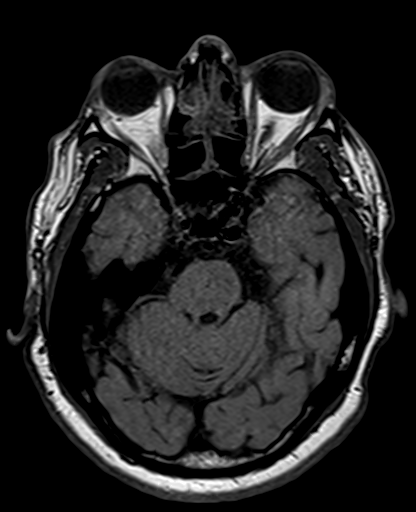
[im 34/51]
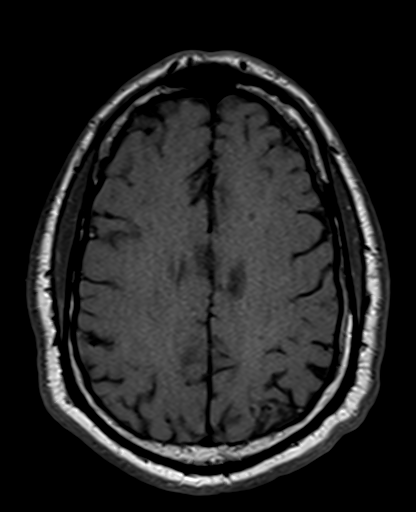
[im 51/51]
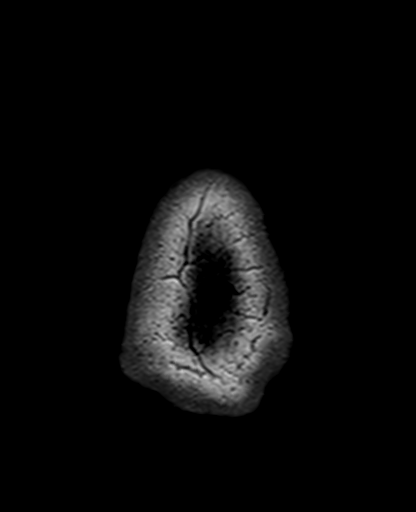

[Series 12: DWI · coronal · 5.0mm · 1.31mm/px · 5 of 64 slices shown (1 of 2)]
[im 1/64]
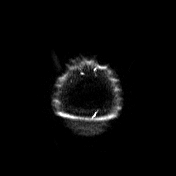
[im 16/64]
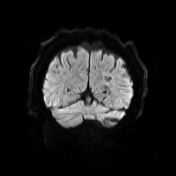
[im 32/64]
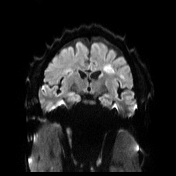
[im 48/64]
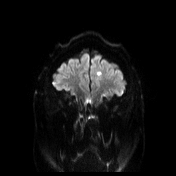
[im 64/64]
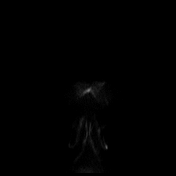

[Series 13: DWI · coronal · 5.0mm · 1.31mm/px · 3 of 32 slices shown (2 of 2)]
[im 1/32]
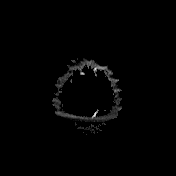
[im 16/32]
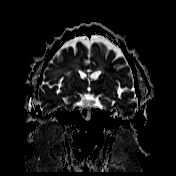
[im 32/32]
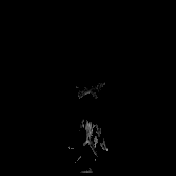

[Series 14: T2 · coronal · 5.0mm · 0.86mm/px · 2 of 31 slices shown (3 of 3)]
[im 1/31]
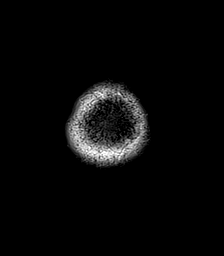
[im 31/31]
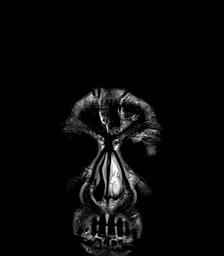

[38 of 48 positions shown; findings below may reference images not displayed]

FINDINGS: Brain:

Mild cerebral and cerebellar atrophy.

Redemonstrated patchy cortical/subcortical infarcts within the mid
left frontal lobe/frontal operculum, now subacute. However, new from
the prior brain MRI of [DATE], there are new acute infarcts
within the cortical and subcortical bilateral frontal lobes, and
within the left callosal body/genu (bilateral MCA and ACA
territories). The largest acute infarct is present within the left
callosal body/genu and measures 2 cm.

Redemonstrated chronic cortical/subcortical infarct within the left
parietal lobe.

Redemonstrated chronic infarct within the left cerebellar
hemisphere.

Background moderate severe multifocal T2/FLAIR hyperintensity within
the cerebral white matter, nonspecific but compatible with chronic
small vessel ischemic disease. This includes chronic lacunar
infarcts within the bilateral centrum semiovale and corona radiata.

Redemonstrated chronic microhemorrhages within the bilateral
parietal lobes.

No evidence of intracranial mass.

No extra-axial fluid collection.

No midline shift.

Vascular: Expected proximal arterial flow voids.

Skull and upper cervical spine: No focal marrow lesion.

Sinuses/Orbits: Visualized orbits show no acute finding. Mild
mucosal thickening within the bilateral frontal sinuses. Moderate
bilateral ethmoid sinus mucosal thickening. Mild bilateral maxillary
sinus mucosal thickening.
IMPRESSION: Redemonstrated patchy cortical/subcortical infarcts within the mid
left frontal lobe/frontal operculum, now subacute.

New from the prior brain MRI of [DATE], there are small acute
infarcts within the cortical and subcortical frontal lobes
bilaterally, and within the left callosal body/genu (bilateral MCA
and ACA territories). The largest acute infarct is present within
the left callosal body/genu, measuring 2 cm.

Redemonstrated chronic cortically based infarct within the left
parietal lobe and chronic infarct within the left cerebellar
hemisphere.

Stable background severe cerebral white matter chronic small vessel
ischemic disease.

Stable mild generalized parenchymal atrophy.

Paranasal sinus disease, as described.

## 2020-05-30 IMAGING — CT CT HEAD W/O CM
3 series · 14 of 47 positions shown, 16 images · non-contrast
Comparison: [DATE]

CLINICAL DATA: Mental status changes.

EXAM:
CT HEAD WITHOUT CONTRAST
TECHNIQUE: Contiguous axial images were obtained from the base of the skull
through the vertex without intravenous contrast.

[Series 2: head wo · axial · 0.47mm/px · z∈[+1330,+1465]mm · 8 of 33 slices shown, 10 images]
[im 3/33  brain]
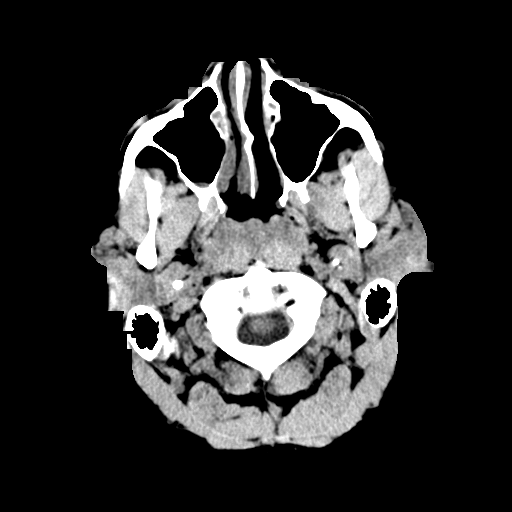
[im 3/33  bone]
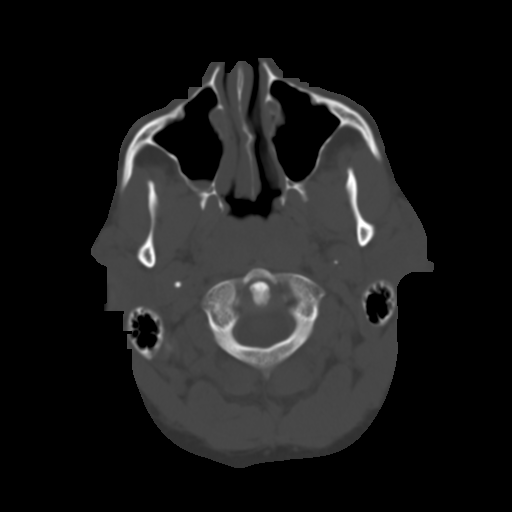
[im 7/33  brain]
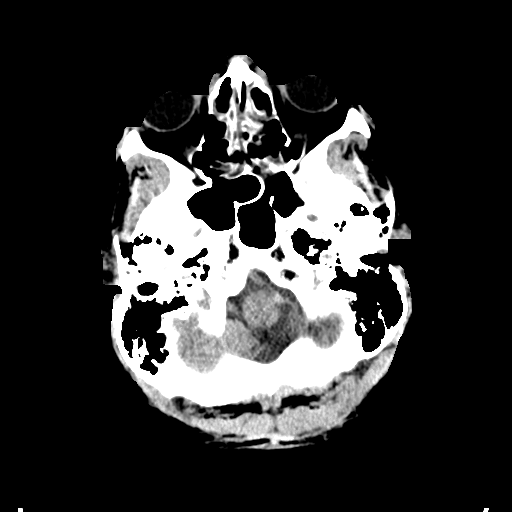
[im 10/33  brain]
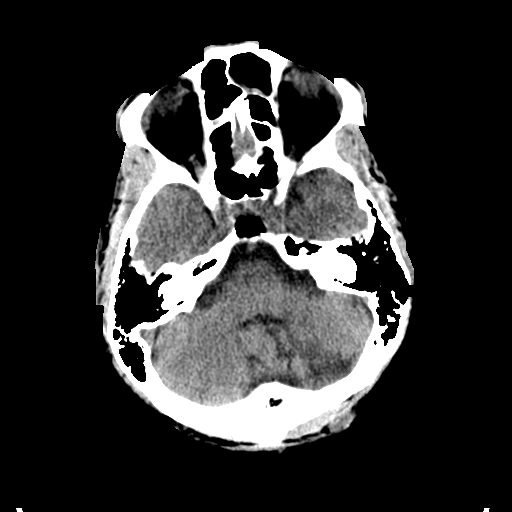
[im 15/33  brain]
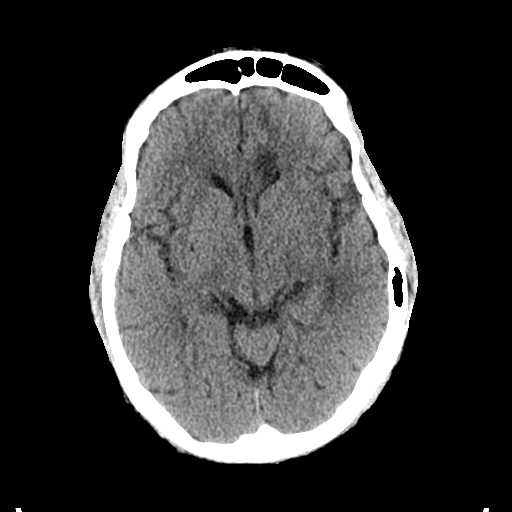
[im 18/33  brain]
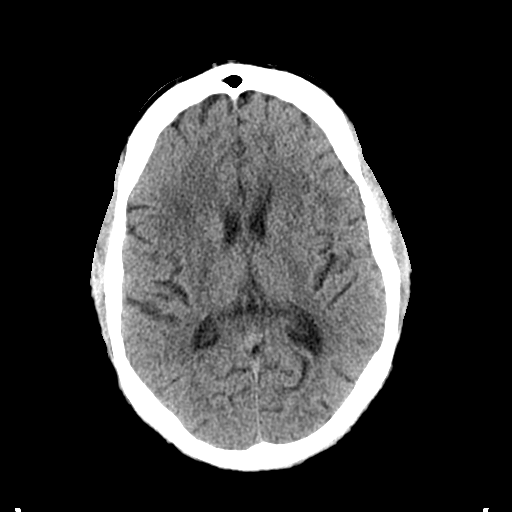
[im 18/33  bone]
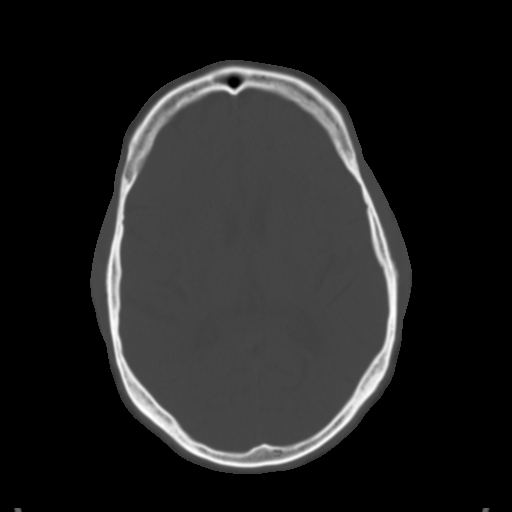
[im 23/33  brain]
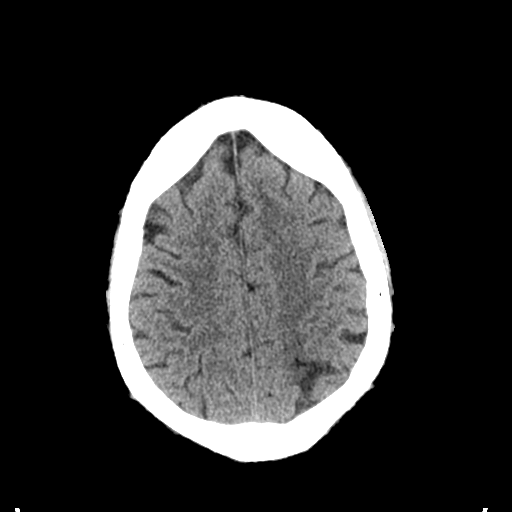
[im 26/33  brain]
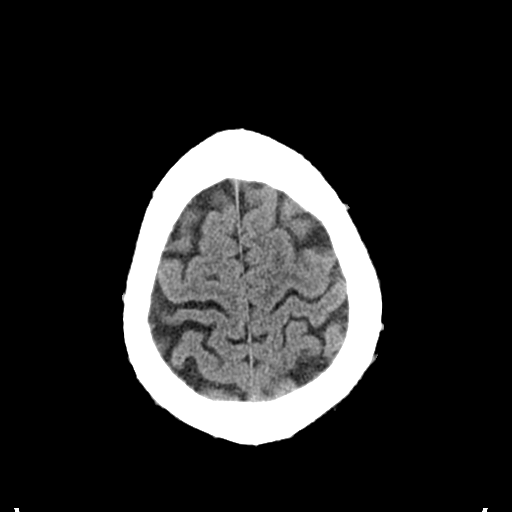
[im 30/33  brain]
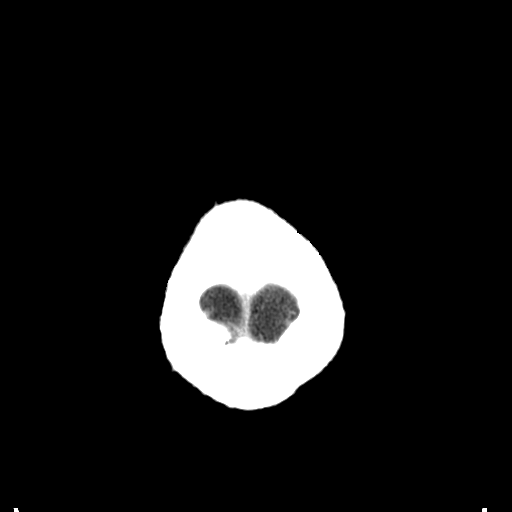

[Series 5: coronal soft tissue · coronal · 0.35mm/px · 3 of 74 slices shown]
[im 25/74  brain]
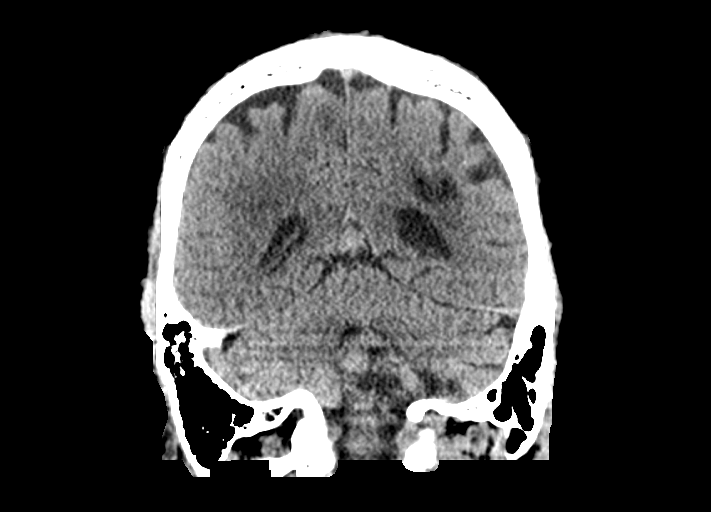
[im 33/74  brain]
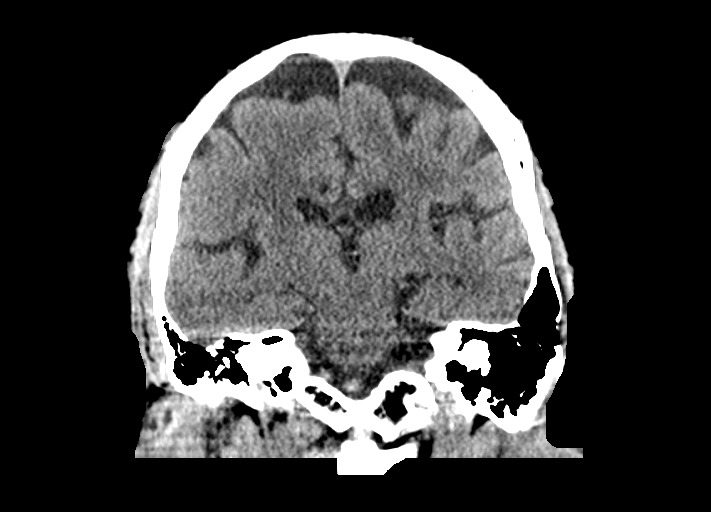
[im 41/74  brain]
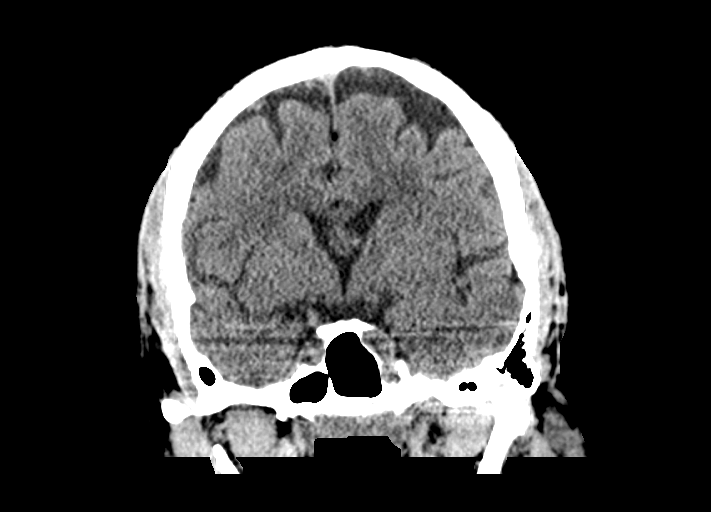

[Series 6: sagittal soft tissue · sagittal · 0.33mm/px · 3 of 60 slices shown]
[im 20/60  brain]
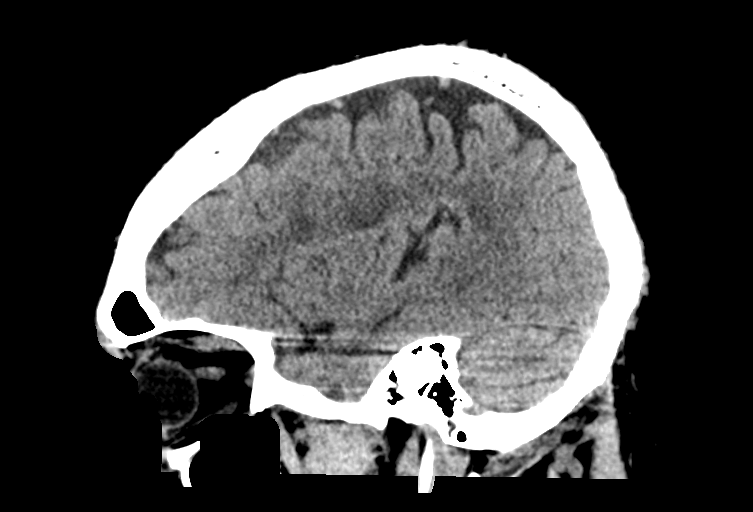
[im 30/60  brain]
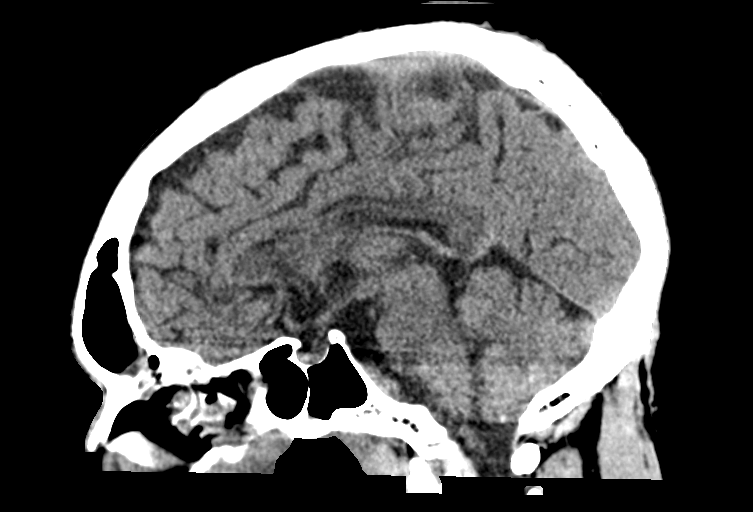
[im 40/60  brain]
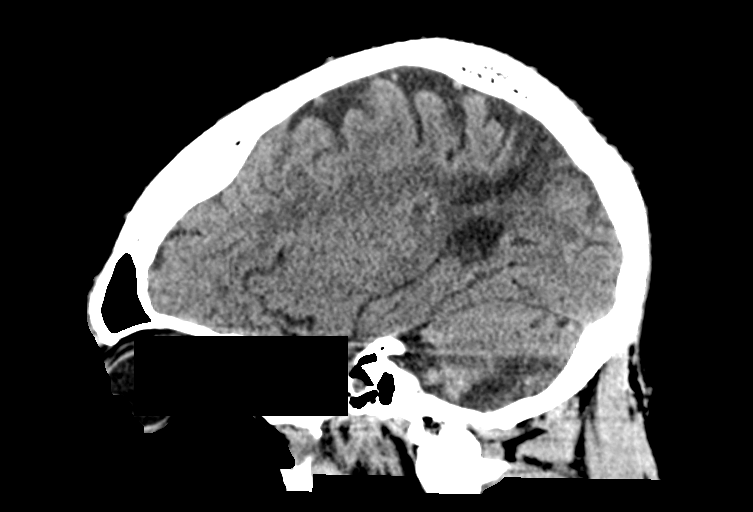

[14 of 47 positions shown; findings below may reference images not displayed]

FINDINGS: Brain: Periventricular white matter changes are consistent with
small vessel disease. Remote infarcts involving the LEFT frontal
lobe, LEFT posterior parietal lobe, LEFT cerebellum. Stable
appearance of patchy low-attenuation in the RIGHT corona radiata.
There is no intra or extra-axial fluid collection or mass lesion.
The basilar cisterns and ventricles have a normal appearance. There
is no CT evidence for acute infarction or hemorrhage.

Vascular: No hyperdense vessel or unexpected calcification.

Skull: Normal. Negative for fracture or focal lesion.

Sinuses/Orbits: Moderate mucosal thickening of the paranasal
sinuses. No air-fluid levels.

Other: None.
IMPRESSION: 1. Stable appearance of chronic LEFT infarcts.
2. Periventricular white matter changes are stable.
3.  No evidence for acute intracranial abnormality.
4. Chronic sinus changes.

## 2020-05-30 MED ORDER — INSULIN ASPART 100 UNIT/ML IJ SOLN
0.0000 [IU] | INTRAMUSCULAR | Status: DC
  Administered 2020-05-30: 3 [IU] via SUBCUTANEOUS
  Filled 2020-05-30: qty 0.2

## 2020-05-30 MED ORDER — ACETAMINOPHEN 325 MG PO TABS: 650.0000 mg | ORAL_TABLET | ORAL | Status: DC | PRN

## 2020-05-30 MED ORDER — ACETAMINOPHEN 160 MG/5ML PO SOLN: 650.0000 mg | ORAL | Status: DC | PRN

## 2020-05-30 MED ORDER — ATORVASTATIN CALCIUM 40 MG PO TABS
40.0000 mg | ORAL_TABLET | Freq: Every day | ORAL | Status: DC
  Administered 2020-05-30 – 2020-06-03 (×5): 40 mg via ORAL
  Filled 2020-05-30 (×5): qty 1

## 2020-05-30 MED ORDER — ACETAMINOPHEN 650 MG RE SUPP: 650.0000 mg | RECTAL | Status: DC | PRN

## 2020-05-30 MED ORDER — ASPIRIN EC 325 MG PO TBEC
325.0000 mg | DELAYED_RELEASE_TABLET | Freq: Every day | ORAL | Status: DC
  Administered 2020-05-31 – 2020-06-03 (×4): 325 mg via ORAL
  Filled 2020-05-30 (×5): qty 1

## 2020-05-30 MED ORDER — STROKE: EARLY STAGES OF RECOVERY BOOK
Freq: Once | Status: DC
  Filled 2020-05-30: qty 1

## 2020-05-30 MED ORDER — ENOXAPARIN SODIUM 40 MG/0.4ML IJ SOSY
40.0000 mg | PREFILLED_SYRINGE | INTRAMUSCULAR | Status: DC
  Administered 2020-05-30 – 2020-06-02 (×4): 40 mg via SUBCUTANEOUS
  Filled 2020-05-30 (×4): qty 0.4

## 2020-05-30 MED ORDER — CLOPIDOGREL BISULFATE 75 MG PO TABS
75.0000 mg | ORAL_TABLET | Freq: Every day | ORAL | Status: DC
  Administered 2020-05-31 – 2020-06-03 (×4): 75 mg via ORAL
  Filled 2020-05-30 (×5): qty 1

## 2020-05-30 MED ORDER — SODIUM CHLORIDE 0.9 % IV SOLN: INTRAVENOUS | Status: AC

## 2020-05-30 MED ORDER — LABETALOL HCL 5 MG/ML IV SOLN: 10.0000 mg | INTRAVENOUS | Status: DC | PRN

## 2020-05-30 MED ORDER — HYDRALAZINE HCL 20 MG/ML IJ SOLN: 10.0000 mg | INTRAMUSCULAR | Status: DC | PRN

## 2020-05-30 NOTE — ED Notes (Signed)
Called bed placement to see if there are any available beds for [facility] at [facility], states there are not available neuro beds at this time and patient will be holding.

## 2020-05-30 NOTE — ED Provider Notes (Signed)
Patient seen after prior ED provider.  MR demonstrates new CVA.  Neuro at [facility] is aware of case.  Patient is to be transferred to [facility] for further work-up and treatment.    Hospitalist service at [facility] is aware of case and will evaluate for admission and transfer to [facility].     Wynetta Fines, MD 05/30/20 [phone]

## 2020-05-30 NOTE — H&P (Signed)
History and Physical    Nathaniel Spears  DXA:[id number]  DOB: [birth year]  DOA: 05/30/2020  PCP: Kerin Perna, NP Patient coming from: home  Chief Complaint: weakness, dizziness, falling, trouble with speech  HPI:  [name] is a 45 yo male with PMH recent CVA (just discharged 04/23/20), HTN, substance use (cocaine positive last admission), DMII, obesity who presented with worsening left lower extremity weakness, dizziness, falling, and ongoing aphasia.  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  He has been working with PT, OT, SLP outpatient.  He continues to have difficulty with expressive and receptive aphasia as well as apraxia and occasional dysarthria.  The patient's wife noted that he has been acting more unusual the past couple days and along with his falling and weakness, he was brought to the ER for further evaluation. CT head showed stable chronic left infarcts and MRI brain was obtained which showed new acute infarcts involving the cortical and subcortical frontal lobes bilaterally.  He is admitted for repeat evaluation with neurology and possible further work-up given recurrent left-sided strokes and now a bilateral stroke concerning for embolic phenomenon.  When seen in the ER he again was noted to have receptive and expressive aphasia.  He did not have a significant amount of dysarthria.  Left-sided weakness was again appreciated in his arm and leg.   I have personally briefly reviewed patient's old medical records in [facility] and discussed patient with the ER provider when appropriate/indicated.  Assessment/Plan:   Acute CVA - it was reported to ER per wife that he had not taken medications for approximately 2 days prior to admission including blood pressure medications. - MRI brain now shows: new acute infarcts involving the cortical  and subcortical frontal lobes bilaterally (has known chronic left cerebellum, left parietal lobe, and moderate chronic microvascular ischemic changes seen on prior MRI brain).  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 No PFO -Plan per neurology last hospitalization was aspirin 325 mg daily and Plavix 75 mg daily for 3 months then aspirin alone - etiology of CVA last hospitalization presumed 2/2 MCA branch stenosis; given new bilateral CVA, may need to consider TEE to rule out cardioembolic phenomena as well - PT, OT, SLP evals  - NPO for now - neurology aware of patient via EDP; plan to transfer to [facility] for ongoing neuro eval and workup - asa and plavix continued - permissive HTN thru today; treat for SBP > 220/110 - follow up UDS: negative (last hospitalization cocaine positive) - continue NS @ 75 x 24 hours - A1c 11.1 % on 04/20/20; continue on q4h SSI while NPO  HTN - hold home meds - permissive HTN as noted above - check trop due to some TWI noted on EKG  DMII - A1c 11.1% on 04/20/20 - continue SSI - q4h for now until seen by SLP  Obesity Body mass index is 33.33 kg/m. - needs ongoing lifestyle modification   Code Status: Full  DVT Prophylaxis: Lovenox     Anticipated disposition is to: home  History: Past Medical History:  Diagnosis Date  . Hypertension  No past surgical history on file.   reports that he has been smoking. He has never used smokeless tobacco. He reports previous alcohol use. He reports that he does not use drugs.  No Known Allergies  Unable to obtain family history due to patient's aphasia  Home Medications: Prior to Admission medications   Medication Sig Start Date End Date  Taking? Authorizing Provider  amLODipine (NORVASC) 10 MG tablet TAKE 1 TABLET BY MOUTH DAILY. 08/21/19 08/20/20 Yes Kerin Perna, NP  aspirin EC 325 MG EC tablet Take 1 tablet (325 mg total) by mouth daily. 04/24/20  Yes Arrien, Jimmy Picket, MD  atorvastatin (LIPITOR) 40 MG tablet Take 40 mg by mouth daily.   Yes [provider]  clopidogrel (PLAVIX) 75 MG tablet Take 1 tablet (75 mg total) by mouth daily. 05/24/20 06/23/20 Yes Kerin Perna, NP  Insulin Glargine (BASAGLAR KWIKPEN) 100 UNIT/ML Inject 10 Units into the skin daily. 05/18/20  Yes Kerin Perna, NP  losartan-hydrochlorothiazide (HYZAAR) 100-25 MG tablet Take 1 tablet by mouth daily.   Yes [provider]  metFORMIN (GLUCOPHAGE) 1000 MG tablet Take 1 tablet (1,000 mg total) by mouth 2 (two) times daily with a meal. 05/18/20  Yes Kerin Perna, NP  atorvastatin (LIPITOR) 40 MG tablet Take 1 tablet (40 mg total) by mouth daily. 04/24/20 05/27/20  Arrien, Jimmy Picket, MD  blood glucose meter kit and supplies Dispense based on patient and insurance preference. Use up to four times daily as directed. (FOR ICD-10 E10.9, E11.9). 04/23/20   Arrien, Jimmy Picket, MD  Blood Glucose Monitoring Suppl (TRUE METRIX METER) w/Device KIT Use to check blood sugar TID. 05/18/20   Charlott Rakes, MD  glucose blood (TRUE METRIX BLOOD GLUCOSE TEST) test strip Use to check blood sugar TID. 05/18/20   Charlott Rakes, MD  losartan-hydrochlorothiazide (HYZAAR) 100-25 MG tablet Take 1 tablet by mouth daily. 04/23/20 05/27/20  Arrien, Jimmy Picket, MD  TRUEplus Lancets 28G MISC Use to check blood sugar 3 times daily. Patient taking differently: Use to check blood sugar 3 times daily. 05/23/20   Kerin Perna, NP    Review of Systems:   Review of systems not obtained due to patient factors. Due to aphasia  Physical Exam: Vitals:   05/30/20 1349 05/30/20 1500 05/30/20 1615 05/30/20 1700  BP:  (!) 163/105 (!) 198/115 (!)  149/97  Pulse:  75 82 84  Resp:  '18 16 13  ' SpO2:  98% 100% 100%  Weight: 114.6 kg     Height: '6\' 1"'  (1.854 m)      General appearance: NAD; obvious receptive and expressive aphasia Head: Normocephalic, without obvious abnormality, atraumatic Eyes: EOMI Lungs: clear to auscultation bilaterally  Neck: no obvious bruit Heart: regular rate and rhythm and S1, S2 normal Abdomen: normal findings: bowel sounds normal and soft, non-tender Extremities: no edema Skin: mobility and turgor normal Neurologic: difficulty following verbal commands; difficulty with fluent speech; 4/5 strength in LUE/LLE. No subjective paresthesia per patient. Gait and reflexes deferred.  No dysmetria with finger-to-nose  Labs on Admission:  I have personally reviewed following labs and imaging studies Results for orders placed or performed during the hospital encounter of 05/30/20 (from the past 24 hour(s))  Ethanol     Status: None   Collection Time: 05/30/20  1:26 PM  Result Value Ref Range   Alcohol, Ethyl (B) <10 <10 mg/dL  Protime-INR     Status: None   Collection Time: 05/30/20  1:26 PM  Result Value Ref Range   Prothrombin Time 13.8 11.4 - 15.2 seconds   INR 1.1 0.8 - 1.2  APTT     Status: None   Collection Time: 05/30/20  1:26 PM  Result Value Ref Range   aPTT 30 24 - 36 seconds  CBC     Status: Abnormal   Collection Time: 05/30/20  1:26 PM  Result Value Ref Range   WBC 7.5 4.0 - 10.5 K/uL   RBC 4.87 4.22 - 5.81 MIL/uL   Hemoglobin 12.7 (L) 13.0 - 17.0 g/dL   HCT 38.7 (L) 39.0 - 52.0 %   MCV 79.5 (L) 80.0 - 100.0 fL   MCH 26.1 26.0 - 34.0 pg   MCHC 32.8 30.0 - 36.0 g/dL   RDW 12.5 11.5 - 15.5 %   Platelets 175 150 - 400 K/uL   nRBC 0.0 0.0 - 0.2 %  Differential     Status: None   Collection Time: 05/30/20  1:26 PM  Result Value Ref Range   Neutrophils Relative % 67 %   Neutro Abs 5.0 1.7 - 7.7 K/uL   Lymphocytes Relative 23 %   Lymphs Abs 1.7 0.7 - 4.0 K/uL   Monocytes Relative 8 %    Monocytes Absolute 0.6 0.1 - 1.0 K/uL   Eosinophils Relative 2 %   Eosinophils Absolute 0.1 0.0 - 0.5 K/uL   Basophils Relative 0 %   Basophils Absolute 0.0 0.0 - 0.1 K/uL   Immature Granulocytes 0 %   Abs Immature Granulocytes 0.02 0.00 - 0.07 K/uL  Comprehensive metabolic panel     Status: Abnormal   Collection Time: 05/30/20  1:26 PM  Result Value Ref Range   Sodium 140 135 - 145 mmol/L   Potassium 3.4 (L) 3.5 - 5.1 mmol/L   Chloride 103 98 - 111 mmol/L   CO2 31 22 - 32 mmol/L   Glucose, Bld 221 (H) 70 - 99 mg/dL   BUN 19 6 - 20 mg/dL   Creatinine, Ser 1.50 (H) 0.61 - 1.24 mg/dL   Calcium 8.4 (L) 8.9 - 10.3 mg/dL   Total Protein 7.3 6.5 - 8.1 g/dL   Albumin 3.7 3.5 - 5.0 g/dL   AST 14 (L) 15 - 41 U/L   ALT 14 0 - 44 U/L   Alkaline Phosphatase 69 38 - 126 U/L   Total Bilirubin 0.4 0.3 - 1.2 mg/dL   GFR, Estimated 58 (L) >60 mL/min   Anion gap 6 5 - 15  Ammonia     Status: Abnormal   Collection Time: 05/30/20  1:28 PM  Result Value Ref Range   Ammonia 38 (H) 9 - 35 umol/L  Blood gas, venous     Status: Abnormal   Collection Time: 05/30/20  1:28 PM  Result Value Ref Range   pH, Ven 7.406 7.250 - 7.430   pCO2, Ven 48.7 44.0 - 60.0 mmHg   pO2, Ven 35.1 32.0 - 45.0 mmHg   Bicarbonate 30.0 (H) 20.0 - 28.0 mmol/L   Acid-Base Excess 4.9 (H) 0.0 - 2.0 mmol/L   O2 Saturation 65.1 %   Patient temperature 98.6   I-stat chem 8, ED     Status: Abnormal   Collection Time: 05/30/20  1:40 PM  Result Value Ref Range   Sodium 142 135 - 145 mmol/L   Potassium 3.4 (L) 3.5 - 5.1 mmol/L   Chloride 99 98 - 111 mmol/L   BUN 17 6 - 20 mg/dL   Creatinine, Ser 1.50 (  H) 0.61 - 1.24 mg/dL   Glucose, Bld 215 (H) 70 - 99 mg/dL   Calcium, Ion 1.11 (L) 1.15 - 1.40 mmol/L   TCO2 29 22 - 32 mmol/L   Hemoglobin 12.6 (L) 13.0 - 17.0 g/dL   HCT 37.0 (L) 39.0 - 52.0 %  Resp Panel by RT-PCR (Flu A&B, Covid) Nasopharyngeal Swab     Status: None   Collection Time: 05/30/20  1:55 PM   Specimen:  Nasopharyngeal Swab; Nasopharyngeal(NP) swabs in vial transport medium  Result Value Ref Range   SARS Coronavirus 2 by RT PCR NEGATIVE NEGATIVE   Influenza A by PCR NEGATIVE NEGATIVE   Influenza B by PCR NEGATIVE NEGATIVE  Urine rapid drug screen (hosp performed)     Status: None   Collection Time: 05/30/20  4:44 PM  Result Value Ref Range   Opiates NONE DETECTED NONE DETECTED   Cocaine NONE DETECTED NONE DETECTED   Benzodiazepines NONE DETECTED NONE DETECTED   Amphetamines NONE DETECTED NONE DETECTED   Tetrahydrocannabinol NONE DETECTED NONE DETECTED   Barbiturates NONE DETECTED NONE DETECTED  Urinalysis, Routine w reflex microscopic     Status: Abnormal   Collection Time: 05/30/20  4:44 PM  Result Value Ref Range   Color, Urine YELLOW YELLOW   APPearance CLEAR CLEAR   Specific Gravity, Urine 1.016 1.005 - 1.030   pH 7.0 5.0 - 8.0   Glucose, UA >=500 (A) NEGATIVE mg/dL   Hgb urine dipstick NEGATIVE NEGATIVE   Bilirubin Urine NEGATIVE NEGATIVE   Ketones, ur NEGATIVE NEGATIVE mg/dL   Protein, ur NEGATIVE NEGATIVE mg/dL   Nitrite NEGATIVE NEGATIVE   Leukocytes,Ua NEGATIVE NEGATIVE   RBC / HPF 0-5 0 - 5 RBC/hpf   WBC, UA 0-5 0 - 5 WBC/hpf   Bacteria, UA NONE SEEN NONE SEEN     Radiological Exams on Admission: CT HEAD WO CONTRAST  Result Date: 05/30/2020 CLINICAL DATA:  Mental status changes. EXAM: CT HEAD WITHOUT CONTRAST TECHNIQUE: Contiguous axial images were obtained from the base of the skull through the vertex without intravenous contrast. COMPARISON:  04/21/2020 FINDINGS: Brain: Periventricular white matter changes are consistent with small vessel disease. Remote infarcts involving the LEFT frontal lobe, LEFT posterior parietal lobe, LEFT cerebellum. Stable appearance of patchy low-attenuation in the RIGHT corona radiata. There is no intra or extra-axial fluid collection or mass lesion. The basilar cisterns and ventricles have a normal appearance. There is no CT evidence for  acute infarction or hemorrhage. Vascular: No hyperdense vessel or unexpected calcification. Skull: Normal. Negative for fracture or focal lesion. Sinuses/Orbits: Moderate mucosal thickening of the paranasal sinuses. No air-fluid levels. Other: None. IMPRESSION: 1. Stable appearance of chronic LEFT infarcts. 2. Periventricular white matter changes are stable. 3.  No evidence for acute intracranial abnormality. 4. Chronic sinus changes. Electronically Signed   By: Nolon Nations M.D.   On: 05/30/2020 14:34   MR Brain Wo Contrast (neuro protocol)  Result Date: 05/30/2020 CLINICAL DATA:  Mental status change, unknown cause. EXAM: MRI HEAD WITHOUT CONTRAST TECHNIQUE: Multiplanar, multiecho pulse sequences of the brain and surrounding structures were obtained without intravenous contrast. COMPARISON:  Noncontrast head CT performed earlier today 05/30/2020. CT angiogram head/neck 04/21/2020. Brain MRI 04/20/2020. FINDINGS: Brain: Mild cerebral and cerebellar atrophy. Redemonstrated patchy cortical/subcortical infarcts within the mid left frontal lobe/frontal operculum, now subacute. However, new from the prior brain MRI of 04/20/2020, there are new acute infarcts within the cortical and subcortical bilateral frontal lobes, and within the left callosal body/genu (bilateral MCA  and ACA territories). The largest acute infarct is present within the left callosal body/genu and measures 2 cm. Redemonstrated chronic cortical/subcortical infarct within the left parietal lobe. Redemonstrated chronic infarct within the left cerebellar hemisphere. Background moderate severe multifocal T2/FLAIR hyperintensity within the cerebral white matter, nonspecific but compatible with chronic small vessel ischemic disease. This includes chronic lacunar infarcts within the bilateral centrum semiovale and corona radiata. Redemonstrated chronic microhemorrhages within the bilateral parietal lobes. No evidence of intracranial mass. No  extra-axial fluid collection. No midline shift. Vascular: Expected proximal arterial flow voids. Skull and upper cervical spine: No focal marrow lesion. Sinuses/Orbits: Visualized orbits show no acute finding. Mild mucosal thickening within the bilateral frontal sinuses. Moderate bilateral ethmoid sinus mucosal thickening. Mild bilateral maxillary sinus mucosal thickening. IMPRESSION: Redemonstrated patchy cortical/subcortical infarcts within the mid left frontal lobe/frontal operculum, now subacute. New from the prior brain MRI of 04/20/2020, there are small acute infarcts within the cortical and subcortical frontal lobes bilaterally, and within the left callosal body/genu (bilateral MCA and ACA territories). The largest acute infarct is present within the left callosal body/genu, measuring 2 cm. 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 Stable background severe cerebral white matter chronic small vessel ischemic disease. Stable mild generalized parenchymal atrophy. Paranasal sinus disease, as described. Electronically Signed   By: Kellie Simmering DO   On: 05/30/2020 16:10   MR Brain Wo Contrast (neuro protocol)  Final Result    CT HEAD WO CONTRAST  Final Result      Consults called:  Neurology aware of patient transferring to [facility]; Follow up to confirm in morning   EKG: Independently reviewed. TWI noted in III, aVF, V5, V6 not seen on prior 4/5 EKG.    Dwyane Dee, MD [facility] 05/30/2020, 5:18 PM

## 2020-05-30 NOTE — ED Notes (Signed)
PT IS AWARE THAT A URINE SAMPLE IS NEEDED.

## 2020-05-30 NOTE — ED Triage Notes (Signed)
Pt arrived via POV from home with c/o weakness and aphasia. Per wife, pt fell in [employer]'s parking lot lastnight and noticed the pt has had increasing difficulty with getting in and out of the car since then. Wife reports the pt just had a stroke in April 2022 and has trouble with speech since then. Wife also reports pt has not been taken his medications for the past two days including blood pressure medication.

## 2020-05-30 NOTE — ED Provider Notes (Signed)
[facility] DEPT Provider Note   CSN: [id number] Arrival date & time: 05/30/20  1309     History Chief Complaint  Patient presents with  . Weakness  . Aphasia    Nathaniel Spears is a 45 y.o. male.  HPI Patient has history of prior stroke.  He is doing rehabilitation therapy.  [name] wife reports that he seemed like he was not himself starting Thursday, 3 days ago.  He seemed to be more confused and weaker doing normal activities.  Yesterday evening he fell in the parking lot at [employer].  At that time he did not want to come to the hospital.  Today he had trouble getting out of the car and seemed too weak to get out although she did not notice any focal unilateral weakness.  He did seem confused and not understanding commands appropriately.  Patient is a very vague historian.  [name] wife denies that he has been sick with fevers or chills.  She does report she made breakfast this morning and he ate per usual.  He is left-handed and she reports he ate using his left hand.    Past Medical History:  Diagnosis Date  . Hypertension     Patient Active Problem List   Diagnosis Date Noted  . CVA (cerebral vascular accident) ([place]) 04/21/2020  . Aphasia   . Polysubstance abuse ([place])   . Diabetes mellitus, new onset ([place])   . Hypokalemia   . Acute CVA (cerebrovascular accident) ([place]) 04/20/2020  . Essential hypertension 08/21/2019  . Obesity 08/21/2019    No past surgical history on file.     No family history on file.  Social History   Tobacco Use  . Smoking status: Current Every Day Smoker  . Smokeless tobacco: Never Used  Substance Use Topics  . Alcohol use: Not Currently  . Drug use: Never    Home Medications Prior to Admission medications   Medication Sig Start Date End Date Taking? Authorizing Provider  amLODipine (NORVASC) 10 MG tablet TAKE 1 TABLET BY MOUTH DAILY. 08/21/19 08/20/20  Kerin Perna, NP  aspirin EC 325 MG EC  tablet Take 1 tablet (325 mg total) by mouth daily. 04/24/20   Arrien, Jimmy Picket, MD  atorvastatin (LIPITOR) 40 MG tablet Take 1 tablet (40 mg total) by mouth daily. 04/24/20 05/27/20  Arrien, Jimmy Picket, MD  blood glucose meter kit and supplies Dispense based on patient and insurance preference. Use up to four times daily as directed. (FOR ICD-10 E10.9, E11.9). 04/23/20   Arrien, Jimmy Picket, MD  Blood Glucose Monitoring Suppl (TRUE METRIX METER) w/Device KIT Use to check blood sugar TID. 05/18/20   Charlott Rakes, MD  clopidogrel (PLAVIX) 75 MG tablet Take 1 tablet (75 mg total) by mouth daily. 05/24/20 06/23/20  Kerin Perna, NP  glucose blood (TRUE METRIX BLOOD GLUCOSE TEST) test strip Use to check blood sugar TID. 05/18/20   Charlott Rakes, MD  Insulin Glargine (BASAGLAR KWIKPEN) 100 UNIT/ML Inject 10 Units into the skin daily. 05/18/20   Kerin Perna, NP  losartan-hydrochlorothiazide (HYZAAR) 100-25 MG tablet Take 1 tablet by mouth daily. 04/23/20 05/27/20  Arrien, Jimmy Picket, MD  metFORMIN (GLUCOPHAGE) 1000 MG tablet Take 1 tablet (1,000 mg total) by mouth 2 (two) times daily with a meal. 05/18/20   Kerin Perna, NP  TRUEplus Lancets 28G MISC Use to check blood sugar 3 times daily. 05/23/20   Kerin Perna, NP    Allergies  Patient has no known allergies.  Review of Systems   Review of Systems Level 5 caveat cannot obtain review of systems due to patient condition Physical Exam Updated Vital Signs BP (!) 163/105   Pulse 75   Resp 18   Ht '6\' 1"'  (1.854 m)   Wt 114.6 kg   SpO2 98%   BMI 33.33 kg/m   Physical Exam Constitutional:      Comments: Patient seems slightly drowsy.  He is slow to answer questions.  No respiratory distress.  Nontoxic in appearance  HENT:     Head: Normocephalic and atraumatic.     Nose: Nose normal.     Mouth/Throat:     Mouth: Mucous membranes are moist.     Pharynx: Oropharynx is clear.  Eyes:     Extraocular Movements:  Extraocular movements intact.     Conjunctiva/sclera: Conjunctivae normal.     Pupils: Pupils are equal, round, and reactive to light.  Cardiovascular:     Rate and Rhythm: Normal rate and regular rhythm.  Pulmonary:     Effort: Pulmonary effort is normal.     Breath sounds: Normal breath sounds.  Abdominal:     General: There is no distension.     Palpations: Abdomen is soft.     Tenderness: There is no abdominal tenderness. There is no guarding.  Musculoskeletal:        General: No swelling or tenderness. Normal range of motion.  Skin:    General: Skin is warm and dry.  Neurological:     Comments: Patient does not answer questions in brisk fashion.  He is making some commentary but very slow to generate speech.  Also slow with comprehension.  Does not seem to understand hold hands out palms in front of them.  He keeps turning his hands over and looking at them.  No focal motor deficit he will perform grip strength bilaterally.  He can move both lower extremities.  There does not seem to be lateralizing weakness.     ED Results / Procedures / Treatments   Labs (all labs ordered are listed, but only abnormal results are displayed) Labs Reviewed  CBC - Abnormal; Notable for the following components:      Result Value   Hemoglobin 12.7 (*)    HCT 38.7 (*)    MCV 79.5 (*)    All other components within normal limits  COMPREHENSIVE METABOLIC PANEL - Abnormal; Notable for the following components:   Potassium 3.4 (*)    Glucose, Bld 221 (*)    Creatinine, Ser 1.50 (*)    Calcium 8.4 (*)    AST 14 (*)    GFR, Estimated 58 (*)    All other components within normal limits  AMMONIA - Abnormal; Notable for the following components:   Ammonia 38 (*)    All other components within normal limits  BLOOD GAS, VENOUS - Abnormal; Notable for the following components:   Bicarbonate 30.0 (*)    Acid-Base Excess 4.9 (*)    All other components within normal limits  I-STAT CHEM 8, ED -  Abnormal; Notable for the following components:   Potassium 3.4 (*)    Creatinine, Ser 1.50 (*)    Glucose, Bld 215 (*)    Calcium, Ion 1.11 (*)    Hemoglobin 12.6 (*)    HCT 37.0 (*)    All other components within normal limits  RESP PANEL BY RT-PCR (FLU A&B, COVID) ARPGX2  ETHANOL  PROTIME-INR  APTT  DIFFERENTIAL  RAPID URINE DRUG SCREEN, HOSP PERFORMED  URINALYSIS, ROUTINE W REFLEX MICROSCOPIC    EKG EKG Interpretation  Date/Time:  Sunday May 30 2020 14:01:10 EDT Ventricular Rate:  79 PR Interval:  143 QRS Duration: 102 QT Interval:  371 QTC Calculation: 426 R Axis:   23 Text Interpretation: Sinus rhythm Abnormal T, consider ischemia, diffuse leads agree, dynamic lateral t wave changes Confirmed by Charlesetta Shanks [phone]) on 05/30/2020 3:28:35 PM   Radiology CT HEAD WO CONTRAST  Result Date: 05/30/2020 CLINICAL DATA:  Mental status changes. EXAM: CT HEAD WITHOUT CONTRAST TECHNIQUE: Contiguous axial images were obtained from the base of the skull through the vertex without intravenous contrast. COMPARISON:  04/21/2020 FINDINGS: Brain: Periventricular white matter changes are consistent with small vessel disease. Remote infarcts involving the LEFT frontal lobe, LEFT posterior parietal lobe, LEFT cerebellum. Stable appearance of patchy low-attenuation in the RIGHT corona radiata. There is no intra or extra-axial fluid collection or mass lesion. The basilar cisterns and ventricles have a normal appearance. There is no CT evidence for acute infarction or hemorrhage. Vascular: No hyperdense vessel or unexpected calcification. Skull: Normal. Negative for fracture or focal lesion. Sinuses/Orbits: Moderate mucosal thickening of the paranasal sinuses. No air-fluid levels. Other: None. IMPRESSION: 1. Stable appearance of chronic LEFT infarcts. 2. Periventricular white matter changes are stable. 3.  No evidence for acute intracranial abnormality. 4. Chronic sinus changes. Electronically Signed    By: Nolon Nations M.D.   On: 05/30/2020 14:34    Procedures Procedures   Medications Ordered in ED Medications - No data to display  ED Course  I have reviewed the triage vital signs and the nursing notes.  Pertinent labs & imaging results that were available during my care of the patient were reviewed by me and considered in my medical decision making (see chart for details).    MDM Rules/Calculators/A&P                          Patient has history of prior stroke.  He is still undergoing physical therapy and seems to have cognitive delay.  [name] wife noted symptoms to worsen approximately 3 days ago.  He became more concerned yesterday when he fell in the parking lot at [employer].  That time he refused to come get treatment.  He reports today he was having difficulty getting out of the car although she cannot say there was any localizing left or right weakness.  Patient is out of the window for tPA.  He has a nonfocal neurologic exam.  This time we will proceed with stroke work-up.  We will also proceed with MRI now that CT is negative for any acute bleed. Final Clinical Impression(s) / ED Diagnoses Final diagnoses:  None    Rx / DC Orders ED Discharge Orders    None       Charlesetta Shanks, MD 05/30/20 1529

## 2020-05-31 ENCOUNTER — Inpatient Hospital Stay (HOSPITAL_COMMUNITY): Payer: Medicaid Other

## 2020-05-31 ENCOUNTER — Other Ambulatory Visit: Payer: Self-pay

## 2020-05-31 DIAGNOSIS — E6609 Other obesity due to excess calories: Secondary | ICD-10-CM

## 2020-05-31 DIAGNOSIS — Z6835 Body mass index (BMI) 35.0-35.9, adult: Secondary | ICD-10-CM

## 2020-05-31 DIAGNOSIS — Z8673 Personal history of transient ischemic attack (TIA), and cerebral infarction without residual deficits: Secondary | ICD-10-CM

## 2020-05-31 DIAGNOSIS — Z794 Long term (current) use of insulin: Secondary | ICD-10-CM

## 2020-05-31 DIAGNOSIS — I639 Cerebral infarction, unspecified: Secondary | ICD-10-CM

## 2020-05-31 LAB — BASIC METABOLIC PANEL
Anion gap: 9 (ref 5–15)
BUN: 14 mg/dL (ref 6–20)
CO2: 24 mmol/L (ref 22–32)
Calcium: 8.2 mg/dL — ABNORMAL LOW (ref 8.9–10.3)
Chloride: 107 mmol/L (ref 98–111)
Creatinine, Ser: 1.17 mg/dL (ref 0.61–1.24)
GFR, Estimated: >60 mL/min (ref >60)
Glucose, Bld: 120 mg/dL — ABNORMAL HIGH (ref 70–99)
Potassium: 3 mmol/L — ABNORMAL LOW (ref 3.5–5.1)
Sodium: 140 mmol/L (ref 135–145)

## 2020-05-31 LAB — CBC WITH DIFFERENTIAL/PLATELET
Abs Immature Granulocytes: 0.02 10*3/uL (ref 0.00–0.07)
Basophils Absolute: 0 10*3/uL (ref 0.0–0.1)
Basophils Relative: 0 %
Eosinophils Absolute: 0.2 10*3/uL (ref 0.0–0.5)
Eosinophils Relative: 3 %
HCT: 35.1 % — ABNORMAL LOW (ref 39.0–52.0)
Hemoglobin: 11.4 g/dL — ABNORMAL LOW (ref 13.0–17.0)
Immature Granulocytes: 0 %
Lymphocytes Relative: 35 %
Lymphs Abs: 2.4 10*3/uL (ref 0.7–4.0)
MCH: 26 pg (ref 26.0–34.0)
MCHC: 32.5 g/dL (ref 30.0–36.0)
MCV: 80 fL (ref 80.0–100.0)
Monocytes Absolute: 0.5 10*3/uL (ref 0.1–1.0)
Monocytes Relative: 8 %
Neutro Abs: 3.7 10*3/uL (ref 1.7–7.7)
Neutrophils Relative %: 54 %
Platelets: 174 10*3/uL (ref 150–400)
RBC: 4.39 MIL/uL (ref 4.22–5.81)
RDW: 12.3 % (ref 11.5–15.5)
WBC: 6.9 10*3/uL (ref 4.0–10.5)
nRBC: 0 % (ref 0.0–0.2)

## 2020-05-31 LAB — LIPID PANEL
Cholesterol: 74 mg/dL (ref 0–200)
HDL: 24 mg/dL — ABNORMAL LOW (ref >40)
LDL Cholesterol: 39 mg/dL (ref 0–99)
Total CHOL/HDL Ratio: 3.1 RATIO
Triglycerides: 55 mg/dL (ref <150)
VLDL: 11 mg/dL (ref 0–40)

## 2020-05-31 LAB — HEMOGLOBIN A1C
Hgb A1c MFr Bld: 10.3 % — ABNORMAL HIGH (ref 4.8–5.6)
Mean Plasma Glucose: 248.91 mg/dL

## 2020-05-31 LAB — MAGNESIUM: Magnesium: 1.5 mg/dL — ABNORMAL LOW (ref 1.7–2.4)

## 2020-05-31 LAB — CBG MONITORING, ED
Glucose-Capillary: 106 mg/dL — ABNORMAL HIGH (ref 70–99)
Glucose-Capillary: 121 mg/dL — ABNORMAL HIGH (ref 70–99)
Glucose-Capillary: 130 mg/dL — ABNORMAL HIGH (ref 70–99)

## 2020-05-31 LAB — GLUCOSE, CAPILLARY
Glucose-Capillary: 122 mg/dL — ABNORMAL HIGH (ref 70–99)
Glucose-Capillary: 148 mg/dL — ABNORMAL HIGH (ref 70–99)
Glucose-Capillary: 154 mg/dL — ABNORMAL HIGH (ref 70–99)
Glucose-Capillary: 137 mg/dL — ABNORMAL HIGH (ref 70–99)

## 2020-05-31 IMAGING — CT CT ANGIO HEAD-NECK (W OR W/O PERF)
2 of 11 series · 7 of 33 positions shown · IV contrast (APPLIED)
Comparison: MRI of the brain [DATE]

CLINICAL DATA: Focal neurological deficit CT, more than 6 hours,
stroke suspected.

EXAM:
CT ANGIOGRAPHY HEAD AND NECK
TECHNIQUE: Multidetector CT imaging of the head and neck was performed using
the standard protocol during bolus administration of intravenous
contrast. Multiplanar CT image reconstructions and MIPs were
obtained to evaluate the vascular anatomy. Carotid stenosis
measurements (when applicable) are obtained utilizing NASCET
criteria, using the distal internal carotid diameter as the
denominator.
CONTRAST:  50mL OMNIPAQUE IOHEXOL 350 MG/ML SOLN

[Series 9: cta neck/head · axial · 0.54mm/px · z∈[-252,-128]mm · 2 of 187 slices shown]
[im 63/187  soft-tissue]
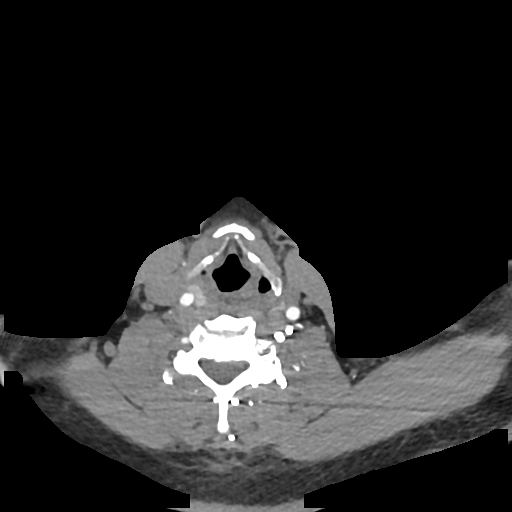
[im 125/187  soft-tissue]
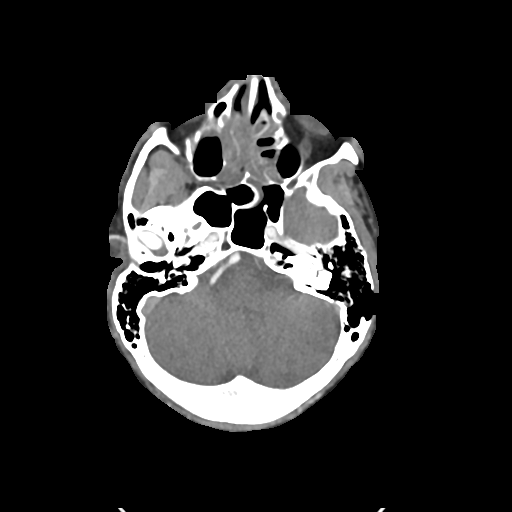

[Series 11: ax thins · axial · 0.39mm/px · z∈[-314,-66]mm · 5 of 373 slices shown]
[im 63/373  soft-tissue]
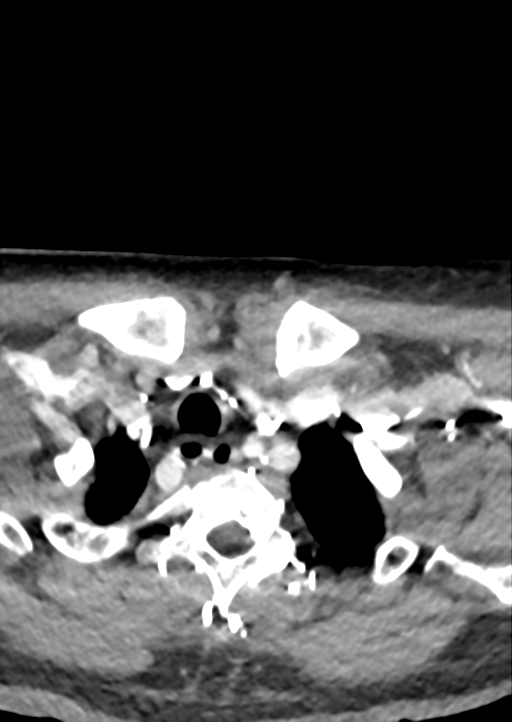
[im 125/373  bone]
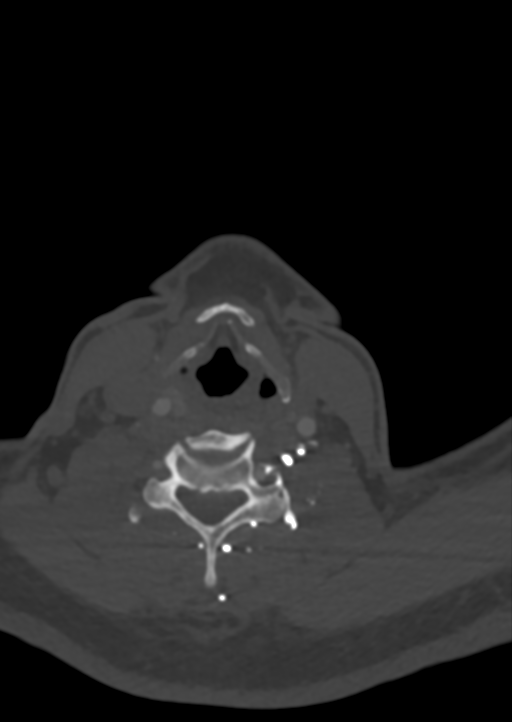
[im 187/373  soft-tissue]
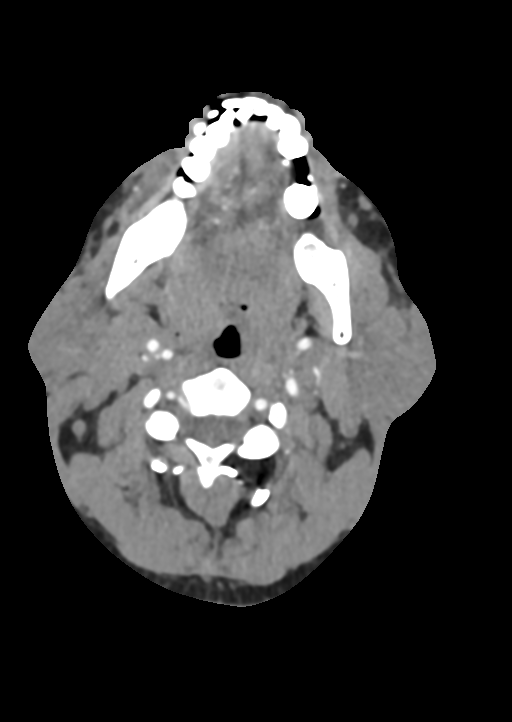
[im 249/373  bone]
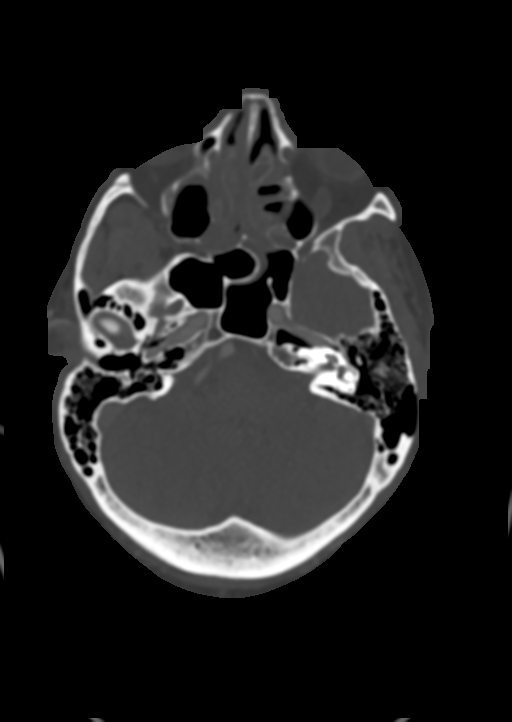
[im 311/373  soft-tissue]
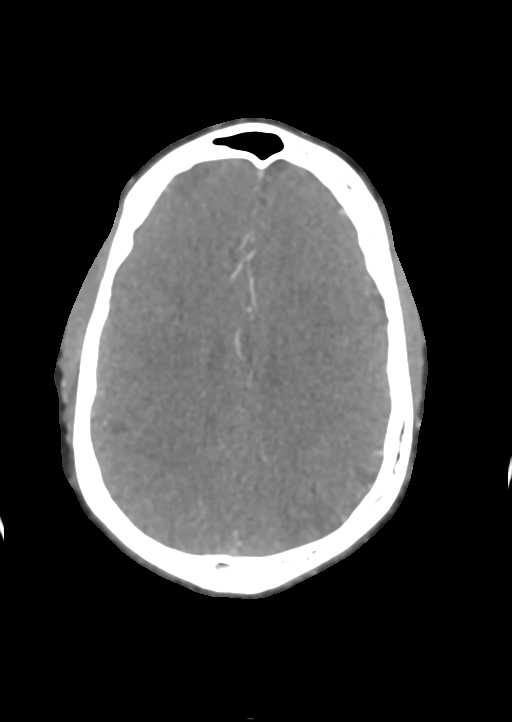

[7 of 33 positions shown; findings below may reference images not displayed]

FINDINGS: CT HEAD FINDINGS

Brain: Multiple hypodense foci are seen along the bilateral ACA and
left MCA territory, corresponding to infarct seen on recent MRI of
the brain. Multiple additional hypodense foci are seen in the
bilateral cerebral hemispheres, corresponding to chronic infarcts.
Area as of encephalomalacia and gliosis in the left parietooccipital
region and inferior left cerebellar hemisphere. No hemorrhage,
hydrocephalus, extra-axial collection or mass lesion.

Vascular: No hyperdense vessel.

Skull: Normal. Negative for fracture or focal lesion.

Sinuses: Mucosal thickening throughout the paranasal sinuses.

Orbits: No acute finding.

Review of the MIP images confirms the above findings

CTA NECK FINDINGS

Aortic arch: Common origin of the right and left common carotid
arteries from the aortic arch and an aberrant, retroesophageal right
subclavian artery are noted. Image portion shows no evidence of
aneurysm or dissection. No significant stenosis at the origin of the
major arch vessels.

Right carotid system: No evidence of dissection, stenosis or
occlusion.

Left carotid system: No evidence of dissection, stenosis or
occlusion.

Vertebral arteries: Left dominant. No evidence of dissection,
stenosis (50% or greater) or occlusion.

Skeleton: Degenerative changes of the cervical spine at C5-6 and
C6-7. No acute or aggressive process identified.

Other neck: Mildly prominent bilateral cervical lymph nodes,
nonspecific. May be reactive. Prominent adenoid tissue.

Upper chest: Negative.

Review of the MIP images confirms the above findings

CTA HEAD FINDINGS

Anterior circulation: Intracranial bilateral internal carotid
arteries abnormal course and caliber.

Bilateral A1 segments are patent noting a hypoplastic right A1/ACA.
Interval development of a new focal occlusion versus high-grade
stenosis at the mid A2 segment of the left anterior cerebral artery
with normal distal opacification. Luminal irregularity is noted
along the bilateral ACA vascular trees.

Interval worsening of bilateral multifocal areas of stenosis of the
bilateral MCA vascular tree preserving the bilateral M1 segments
with severe stenosis at the proximal left M2 segment and persistent
occlusion at the distal left M3/MCA superior division branch. Severe
stenosis at the proximal right M2/MCA superior division branch and
M3/MCA posterior division branches.

Posterior circulation: The intracranial bilateral vertebral arteries
and basilar artery are maintained. Luminal irregularity is seen
along the bilateral posterior cerebral arteries with multifocal
areas of mild stenosis.

Venous sinuses: Poorly opacified.

Review of the MIP images confirms the above findings
IMPRESSION: 1. Multiple hypodense foci along the bilateral ACA and left MCA
vascular trees correlating with acute/subacute infarcts described on
recent MRI.
2. Remote small lacunar infarct in the bilateral cerebral and left
cerebellar hemisphere, as described above.
3. No hemodynamically significant stenosis in the major neck
arteries.
4. Prominent luminal irregularity of the intracranial vasculature,
may represent advanced atherosclerotic disease versus vasculitis.
5. New occlusion versus high-grade stenosis at the left A2/ACA
segment.
6. Multifocal areas of high-grade stenosis the bilateral MCA
vascular tree, as described above, with persistent occlusion of the
distal left M3/MCA superior division branch and worsening stenosis
of right M3/M branches.

## 2020-05-31 MED ORDER — MAGNESIUM SULFATE 2 GM/50ML IV SOLN
2.0000 g | Freq: Once | INTRAVENOUS | Status: AC
  Administered 2020-05-31: 2 g via INTRAVENOUS
  Filled 2020-05-31: qty 50

## 2020-05-31 MED ORDER — IOHEXOL 350 MG/ML SOLN
50.0000 mL | Freq: Once | INTRAVENOUS | Status: AC | PRN
  Administered 2020-05-31: 50 mL via INTRAVENOUS

## 2020-05-31 MED ORDER — INSULIN ASPART 100 UNIT/ML IJ SOLN
0.0000 [IU] | Freq: Three times a day (TID) | INTRAMUSCULAR | Status: DC
  Administered 2020-05-31: 3 [IU] via SUBCUTANEOUS
  Administered 2020-05-31: 4 [IU] via SUBCUTANEOUS
  Administered 2020-05-31 – 2020-06-03 (×4): 3 [IU] via SUBCUTANEOUS
  Filled 2020-05-31: qty 0.2

## 2020-05-31 MED ORDER — MAGNESIUM OXIDE -MG SUPPLEMENT 400 (240 MG) MG PO TABS
800.0000 mg | ORAL_TABLET | Freq: Once | ORAL | Status: AC
  Administered 2020-05-31: 800 mg via ORAL
  Filled 2020-05-31: qty 2

## 2020-05-31 MED ORDER — POTASSIUM CHLORIDE CRYS ER 20 MEQ PO TBCR
40.0000 meq | EXTENDED_RELEASE_TABLET | ORAL | Status: AC
  Administered 2020-05-31 (×2): 40 meq via ORAL
  Filled 2020-05-31 (×2): qty 2

## 2020-05-31 NOTE — ED Notes (Signed)
[facility] called for transport. 

## 2020-05-31 NOTE — Progress Notes (Signed)
Progress Note    Nathaniel Spears   UXL:[id number]  DOB: 03/12/1975  DOA: 05/30/2020     1  PCP: Grayce Sessions, NP  CC: weakness, dizziness, falling, trouble with speech  Hospital Course: Nathaniel Spears is a 45 yo male with PMH recent CVA (just discharged 04/23/20), HTN, substance use (cocaine positive last admission), DMII, obesity who presented with worsening left lower extremity weakness, dizziness, falling, and ongoing aphasia.  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  He has been working with PT, OT, SLP outpatient.  He continues to have difficulty with expressive and receptive aphasia as well as apraxia and occasional dysarthria.  The patient's wife noted that he has been acting more unusual the past couple days and along with his falling and weakness, he was brought to the ER for further evaluation. CT head showed stable chronic left infarcts and MRI brain was obtained which showed new acute infarcts involving the cortical and subcortical frontal lobes bilaterally.  He is admitted for repeat evaluation with neurology and possible further work-up given recurrent left-sided strokes and now a bilateral stroke concerning for embolic phenomenon.  When seen in the ER he again was noted to have receptive and expressive aphasia.  He did not have a significant amount of dysarthria.  Left-sided weakness was again appreciated in his arm and leg.  Interval History:  No events overnight.  Seen this morning in the ER just prior to being transported to [facility].  Feeling about the same as yesterday.  Still having ongoing weakness on his left side along with his aphasia which is unchanged from yesterday.  ROS: Review of systems not obtained due to patient factors. Aphasia  Assessment & Plan: Acute CVA - it was reported to ER per wife that he had not taken medications  for approximately 2 days prior to admission including blood pressure medications. - MRI brain now shows: new acute infarcts involving the cortical and subcortical frontal lobes bilaterally (has known chronic left cerebellum, left parietal lobe, and moderate chronic microvascular ischemic changes seen on prior MRI brain).  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 No PFO -Plan per neurology last hospitalization was aspirin 325 mg daily and Plavix 75 mg daily for 3 months then aspirin alone; DAPT continued - etiology of CVA last hospitalization presumed 2/2 MCA branch stenosis; given new bilateral CVA, may need to consider TEE to rule out cardioembolic phenomena as well - PT, OT, SLP evals  -Patient passed bedside swallow screen.  Diet ordered - neurology aware of patient via EDP; plan to transfer to [facility] for ongoing neuro eval and workup - permissive HTN, can start to bring down later today or as per neuro; treat for SBP > 220/110 - follow up UDS: negative (last hospitalization cocaine positive) - continue NS @ 75 x 24 hours - A1c 10.3 % now; changing SSI to ACHS  HTN - hold home meds - permissive HTN as noted above - check trop due to some TWI noted on EKG: troponin negative, 6  DMII - A1c 10.3% - continue SSI - resume Lantus once oral intake increases  Obesity  Body mass index is 33.33 kg/m. - needs ongoing lifestyle modification   Old records reviewed in assessment of this patient  Antimicrobials:   DVT prophylaxis: enoxaparin (LOVENOX) injection 40 mg Start: 05/30/20 2200   Code Status:   Code Status: Full Code Family Communication:   Disposition Plan: Status is: Inpatient  Remains inpatient  appropriate because:Ongoing diagnostic testing needed not appropriate for outpatient work up and Inpatient level of care appropriate due to severity of illness   Dispo: The patient is from: Home              Anticipated d/c is to: pending PT eval              Patient currently is not medically stable to d/c.   Difficult to place patient No  Risk of unplanned readmission score: Unplanned Admission- Pilot do not use: 21.42   Objective: Blood pressure (!) 145/101, pulse 79, temperature 98.2 F (36.8 C), temperature source Oral, resp. rate 16, height 6\' 1"  (1.854 m), weight 114.6 kg, SpO2 97 %.  Examination: General appearance: NAD; obvious receptive and expressive aphasia Head: Normocephalic, without obvious abnormality, atraumatic Eyes: EOMI Lungs: clear to auscultation bilaterally  Neck: no obvious bruit Heart: regular rate and rhythm and S1, S2 normal Abdomen: normal findings: bowel sounds normal and soft, non-tender Extremities: no edema Skin: mobility and turgor normal Neurologic: difficulty following verbal commands; difficulty with fluent speech; 4/5 strength in LUE/LLE. No subjective paresthesia per patient. Gait and reflexes deferred.  No dysmetria with finger-to-nose  Consultants:   Neuro  Procedures:     Data Reviewed: I have personally reviewed following labs and imaging studies Results for orders placed or performed during the hospital encounter of 05/30/20 (from the past 24 hour(s))  Ethanol     Status: None   Collection Time: 05/30/20  1:26 PM  Result Value Ref Range   Alcohol, Ethyl (B) <10 <10 mg/dL  Protime-INR     Status: None   Collection Time: 05/30/20  1:26 PM  Result Value Ref Range   Prothrombin Time 13.8 11.4 - 15.2 seconds   INR 1.1 0.8 - 1.2  APTT     Status: None   Collection Time: 05/30/20  1:26 PM  Result Value Ref Range   aPTT 30 24 - 36 seconds  CBC     Status: Abnormal   Collection Time: 05/30/20  1:26 PM  Result Value Ref Range   WBC  7.5 4.0 - 10.5 K/uL   RBC 4.87 4.22 - 5.81 MIL/uL   Hemoglobin 12.7 (L) 13.0 - 17.0 g/dL   HCT 06/01/20 (L) 48.1 - 85.6 %   MCV 79.5 (L) 80.0 - 100.0 fL   MCH 26.1 26.0 - 34.0 pg   MCHC 32.8 30.0 - 36.0 g/dL   RDW 31.4 97.0 - 26.3 %   Platelets 175 150 - 400 K/uL   nRBC 0.0 0.0 - 0.2 %  Differential     Status: None   Collection Time: 05/30/20  1:26 PM  Result Value Ref Range   Neutrophils Relative % 67 %   Neutro Abs 5.0 1.7 - 7.7 K/uL   Lymphocytes Relative 23 %   Lymphs Abs 1.7 0.7 - 4.0 K/uL   Monocytes Relative 8 %   Monocytes Absolute 0.6 0.1 - 1.0 K/uL   Eosinophils Relative 2 %   Eosinophils Absolute 0.1 0.0 - 0.5 K/uL   Basophils Relative 0 %   Basophils Absolute 0.0 0.0 - 0.1 K/uL   Immature  Granulocytes 0 %   Abs Immature Granulocytes 0.02 0.00 - 0.07 K/uL  Comprehensive metabolic panel     Status: Abnormal   Collection Time: 05/30/20  1:26 PM  Result Value Ref Range   Sodium 140 135 - 145 mmol/L   Potassium 3.4 (L) 3.5 - 5.1 mmol/L   Chloride 103 98 - 111 mmol/L   CO2 31 22 - 32 mmol/L   Glucose, Bld 221 (H) 70 - 99 mg/dL   BUN 19 6 - 20 mg/dL   Creatinine, Ser 2.80 (H) 0.61 - 1.24 mg/dL   Calcium 8.4 (L) 8.9 - 10.3 mg/dL   Total Protein 7.3 6.5 - 8.1 g/dL   Albumin 3.7 3.5 - 5.0 g/dL   AST 14 (L) 15 - 41 U/L   ALT 14 0 - 44 U/L   Alkaline Phosphatase 69 38 - 126 U/L   Total Bilirubin 0.4 0.3 - 1.2 mg/dL   GFR, Estimated 58 (L) >60 mL/min   Anion gap 6 5 - 15  Ammonia     Status: Abnormal   Collection Time: 05/30/20  1:28 PM  Result Value Ref Range   Ammonia 38 (H) 9 - 35 umol/L  Blood gas, venous     Status: Abnormal   Collection Time: 05/30/20  1:28 PM  Result Value Ref Range   pH, Ven 7.406 7.250 - 7.430   pCO2, Ven 48.7 44.0 - 60.0 mmHg   pO2, Ven 35.1 32.0 - 45.0 mmHg   Bicarbonate 30.0 (H) 20.0 - 28.0 mmol/L   Acid-Base Excess 4.9 (H) 0.0 - 2.0 mmol/L   O2 Saturation 65.1 %   Patient temperature 98.6   I-stat chem 8, ED     Status: Abnormal    Collection Time: 05/30/20  1:40 PM  Result Value Ref Range   Sodium 142 135 - 145 mmol/L   Potassium 3.4 (L) 3.5 - 5.1 mmol/L   Chloride 99 98 - 111 mmol/L   BUN 17 6 - 20 mg/dL   Creatinine, Ser 0.34 (H) 0.61 - 1.24 mg/dL   Glucose, Bld 917 (H) 70 - 99 mg/dL   Calcium, Ion 9.15 (L) 1.15 - 1.40 mmol/L   TCO2 29 22 - 32 mmol/L   Hemoglobin 12.6 (L) 13.0 - 17.0 g/dL   HCT 05.6 (L) 97.9 - 48.0 %  Resp Panel by RT-PCR (Flu A&B, Covid) Nasopharyngeal Swab     Status: None   Collection Time: 05/30/20  1:55 PM   Specimen: Nasopharyngeal Swab; Nasopharyngeal(NP) swabs in vial transport medium  Result Value Ref Range   SARS Coronavirus 2 by RT PCR NEGATIVE NEGATIVE   Influenza A by PCR NEGATIVE NEGATIVE   Influenza B by PCR NEGATIVE NEGATIVE  Urine rapid drug screen (hosp performed)     Status: None   Collection Time: 05/30/20  4:44 PM  Result Value Ref Range   Opiates NONE DETECTED NONE DETECTED   Cocaine NONE DETECTED NONE DETECTED   Benzodiazepines NONE DETECTED NONE DETECTED   Amphetamines NONE DETECTED NONE DETECTED   Tetrahydrocannabinol NONE DETECTED NONE DETECTED   Barbiturates NONE DETECTED NONE DETECTED  Urinalysis, Routine w reflex microscopic     Status: Abnormal   Collection Time: 05/30/20  4:44 PM  Result Value Ref Range   Color, Urine YELLOW YELLOW   APPearance CLEAR CLEAR   Specific Gravity, Urine 1.016 1.005 - 1.030   pH 7.0 5.0 - 8.0   Glucose, UA >=500 (A) NEGATIVE mg/dL   Hgb urine dipstick NEGATIVE NEGATIVE  Bilirubin Urine NEGATIVE NEGATIVE   Ketones, ur NEGATIVE NEGATIVE mg/dL   Protein, ur NEGATIVE NEGATIVE mg/dL   Nitrite NEGATIVE NEGATIVE   Leukocytes,Ua NEGATIVE NEGATIVE   RBC / HPF 0-5 0 - 5 RBC/hpf   WBC, UA 0-5 0 - 5 WBC/hpf   Bacteria, UA NONE SEEN NONE SEEN  Troponin I (High Sensitivity)     Status: None   Collection Time: 05/30/20  5:51 PM  Result Value Ref Range   Troponin I (High Sensitivity) 6 <18 ng/L  CBG monitoring, ED     Status:  Abnormal   Collection Time: 05/30/20  7:23 PM  Result Value Ref Range   Glucose-Capillary 123 (H) 70 - 99 mg/dL  CBG monitoring, ED     Status: Abnormal   Collection Time: 05/31/20 12:11 AM  Result Value Ref Range   Glucose-Capillary 106 (H) 70 - 99 mg/dL  Hemoglobin Z6X     Status: Abnormal   Collection Time: 05/31/20  3:30 AM  Result Value Ref Range   Hgb A1c MFr Bld 10.3 (H) 4.8 - 5.6 %   Mean Plasma Glucose 248.91 mg/dL  Lipid panel     Status: Abnormal   Collection Time: 05/31/20  3:30 AM  Result Value Ref Range   Cholesterol 74 0 - 200 mg/dL   Triglycerides 55 <096 mg/dL   HDL 24 (L) >04 mg/dL   Total CHOL/HDL Ratio 3.1 RATIO   VLDL 11 0 - 40 mg/dL   LDL Cholesterol 39 0 - 99 mg/dL  Basic metabolic panel     Status: Abnormal   Collection Time: 05/31/20  3:30 AM  Result Value Ref Range   Sodium 140 135 - 145 mmol/L   Potassium 3.0 (L) 3.5 - 5.1 mmol/L   Chloride 107 98 - 111 mmol/L   CO2 24 22 - 32 mmol/L   Glucose, Bld 120 (H) 70 - 99 mg/dL   BUN 14 6 - 20 mg/dL   Creatinine, Ser 5.40 0.61 - 1.24 mg/dL   Calcium 8.2 (L) 8.9 - 10.3 mg/dL   GFR, Estimated >98 >11 mL/min   Anion gap 9 5 - 15  CBC with Differential/Platelet     Status: Abnormal   Collection Time: 05/31/20  3:30 AM  Result Value Ref Range   WBC 6.9 4.0 - 10.5 K/uL   RBC 4.39 4.22 - 5.81 MIL/uL   Hemoglobin 11.4 (L) 13.0 - 17.0 g/dL   HCT 91.4 (L) 78.2 - 95.6 %   MCV 80.0 80.0 - 100.0 fL   MCH 26.0 26.0 - 34.0 pg   MCHC 32.5 30.0 - 36.0 g/dL   RDW 21.3 08.6 - 57.8 %   Platelets 174 150 - 400 K/uL   nRBC 0.0 0.0 - 0.2 %   Neutrophils Relative % 54 %   Neutro Abs 3.7 1.7 - 7.7 K/uL   Lymphocytes Relative 35 %   Lymphs Abs 2.4 0.7 - 4.0 K/uL   Monocytes Relative 8 %   Monocytes Absolute 0.5 0.1 - 1.0 K/uL   Eosinophils Relative 3 %   Eosinophils Absolute 0.2 0.0 - 0.5 K/uL   Basophils Relative 0 %   Basophils Absolute 0.0 0.0 - 0.1 K/uL   Immature Granulocytes 0 %   Abs Immature Granulocytes  0.02 0.00 - 0.07 K/uL  Magnesium     Status: Abnormal   Collection Time: 05/31/20  3:30 AM  Result Value Ref Range   Magnesium 1.5 (L) 1.7 - 2.4 mg/dL  CBG monitoring, ED  Status: Abnormal   Collection Time: 05/31/20  3:30 AM  Result Value Ref Range   Glucose-Capillary 121 (H) 70 - 99 mg/dL    Recent Results (from the past 240 hour(s))  Resp Panel by RT-PCR (Flu A&B, Covid) Nasopharyngeal Swab     Status: None   Collection Time: 05/30/20  1:55 PM   Specimen: Nasopharyngeal Swab; Nasopharyngeal(NP) swabs in vial transport medium  Result Value Ref Range Status   SARS Coronavirus 2 by RT PCR NEGATIVE NEGATIVE Final    Comment: (NOTE) SARS-CoV-2 target nucleic acids are NOT DETECTED.  The SARS-CoV-2 RNA is generally detectable in upper respiratory specimens during the acute phase of infection. The lowest concentration of SARS-CoV-2 viral copies this assay can detect is 138 copies/mL. A negative result does not preclude SARS-Cov-2 infection and should not be used as the sole basis for treatment or other patient management decisions. A negative result may occur with  improper specimen collection/handling, submission of specimen other than nasopharyngeal swab, presence of viral mutation(s) within the areas targeted by this assay, and inadequate number of viral copies(<138 copies/mL). A negative result must be combined with clinical observations, patient history, and epidemiological information. The expected result is Negative.  Fact Sheet for Patients:  BloggerCourse.com  Fact Sheet for Healthcare Providers:  SeriousBroker.it  This test is no t yet approved or cleared by the [place] FDA and  has been authorized for detection and/or diagnosis of SARS-CoV-2 by FDA under an Emergency Use Authorization (EUA). This EUA will remain  in effect (meaning this test can be used) for the duration of the COVID-19 declaration under  Section 564(b)(1) of the Act, 21 U.S.C.section 360bbb-3(b)(1), unless the authorization is terminated  or revoked sooner.       Influenza A by PCR NEGATIVE NEGATIVE Final   Influenza B by PCR NEGATIVE NEGATIVE Final    Comment: (NOTE) The Xpert Xpress SARS-CoV-2/FLU/RSV plus assay is intended as an aid in the diagnosis of influenza from Nasopharyngeal swab specimens and should not be used as a sole basis for treatment. Nasal washings and aspirates are unacceptable for Xpert Xpress SARS-CoV-2/FLU/RSV testing.  Fact Sheet for Patients: BloggerCourse.com  Fact Sheet for Healthcare Providers: SeriousBroker.it  This test is not yet approved or cleared by the [place] FDA and has been authorized for detection and/or diagnosis of SARS-CoV-2 by FDA under an Emergency Use Authorization (EUA). This EUA will remain in effect (meaning this test can be used) for the duration of the COVID-19 declaration under Section 564(b)(1) of the Act, 21 U.S.C. section 360bbb-3(b)(1), unless the authorization is terminated or revoked.  Performed at [facility], [address], [place], [place] [address]      Radiology Studies: CT HEAD WO CONTRAST  Result Date: 05/30/2020 CLINICAL DATA:  Mental status changes. EXAM: CT HEAD WITHOUT CONTRAST TECHNIQUE: Contiguous axial images were obtained from the base of the skull through the vertex without intravenous contrast. COMPARISON:  04/21/2020 FINDINGS: Brain: Periventricular white matter changes are consistent with small vessel disease. Remote infarcts involving the LEFT frontal lobe, LEFT posterior parietal lobe, LEFT cerebellum. Stable appearance of patchy low-attenuation in the RIGHT corona radiata. There is no intra or extra-axial fluid collection or mass lesion. The basilar cisterns and ventricles have a normal appearance. There is no CT evidence for acute infarction or hemorrhage.  Vascular: No hyperdense vessel or unexpected calcification. Skull: Normal. Negative for fracture or focal lesion. Sinuses/Orbits: Moderate mucosal thickening of the paranasal sinuses. No air-fluid levels. Other: None.  IMPRESSION: 1. Stable appearance of chronic LEFT infarcts. 2. Periventricular white matter changes are stable. 3.  No evidence for acute intracranial abnormality. 4. Chronic sinus changes. Electronically Signed   By: Norva PavlovElizabeth  Brown M.D.   On: 05/30/2020 14:34   MR Brain Wo Contrast (neuro protocol)  Result Date: 05/30/2020 CLINICAL DATA:  Mental status change, unknown cause. EXAM: MRI HEAD WITHOUT CONTRAST TECHNIQUE: Multiplanar, multiecho pulse sequences of the brain and surrounding structures were obtained without intravenous contrast. COMPARISON:  Noncontrast head CT performed earlier today 05/30/2020. CT angiogram head/neck 04/21/2020. Brain MRI 04/20/2020. FINDINGS: Brain: Mild cerebral and cerebellar atrophy. Redemonstrated patchy cortical/subcortical infarcts within the mid left frontal lobe/frontal operculum, now subacute. However, new from the prior brain MRI of 04/20/2020, there are new acute infarcts within the cortical and subcortical bilateral frontal lobes, and within the left callosal body/genu (bilateral MCA and ACA territories). The largest acute infarct is present within the left callosal body/genu and measures 2 cm. Redemonstrated chronic cortical/subcortical infarct within the left parietal lobe. Redemonstrated chronic infarct within the left cerebellar hemisphere. Background moderate severe multifocal T2/FLAIR hyperintensity within the cerebral white matter, nonspecific but compatible with chronic small vessel ischemic disease. This includes chronic lacunar infarcts within the bilateral centrum semiovale and corona radiata. Redemonstrated chronic microhemorrhages within the bilateral parietal lobes. No evidence of intracranial mass. No extra-axial fluid collection. No midline  shift. Vascular: Expected proximal arterial flow voids. Skull and upper cervical spine: No focal marrow lesion. Sinuses/Orbits: Visualized orbits show no acute finding. Mild mucosal thickening within the bilateral frontal sinuses. Moderate bilateral ethmoid sinus mucosal thickening. Mild bilateral maxillary sinus mucosal thickening. IMPRESSION: Redemonstrated patchy cortical/subcortical infarcts within the mid left frontal lobe/frontal operculum, now subacute. New from the prior brain MRI of 04/20/2020, there are small acute infarcts within the cortical and subcortical frontal lobes bilaterally, and within the left callosal body/genu (bilateral MCA and ACA territories). The largest acute infarct is present within the left callosal body/genu, measuring 2 cm. 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 Stable background severe cerebral white matter chronic small vessel ischemic disease. Stable mild generalized parenchymal atrophy. Paranasal sinus disease, as described. Electronically Signed   By: Jackey LogeKyle  Golden DO   On: 05/30/2020 16:10   MR Brain Wo Contrast (neuro protocol)  Final Result    CT HEAD WO CONTRAST  Final Result    CT ANGIO HEAD NECK W WO CM    (Results Pending)    Scheduled Meds: .  stroke: mapping our early stages of recovery book   Does not apply Once  . aspirin  325 mg Oral Daily  . atorvastatin  40 mg Oral Daily  . clopidogrel  75 mg Oral Daily  . enoxaparin (LOVENOX) injection  40 mg Subcutaneous Q24H  . insulin aspart  0-20 Units Subcutaneous Q4H  . magnesium oxide  800 mg Oral Once  . potassium chloride  40 mEq Oral Q4H   PRN Meds: acetaminophen **OR** acetaminophen (TYLENOL) oral liquid 160 mg/5 mL **OR** acetaminophen, hydrALAZINE, labetalol Continuous Infusions: . sodium chloride 75 mL/hr at 05/31/20 0530     LOS: 1 day  Time spent: Greater than 50% of the 35 minute visit was  spent in counseling/coordination of care for the patient as laid out in the A&P.   Lewie Chamberavid Layce Sprung, MD [facility] 05/31/2020, 7:43 AM

## 2020-05-31 NOTE — TOC Progression Note (Signed)
Transition of Care [facility]) - Progression Note    Patient Details  Name: Nathaniel Spears MRN: [id number] Date of Birth: 02/20/1975  Transition of Care [facility]) CM/SW Contact  Lockie Pares, RN Phone Number: 05/31/2020, 1:24 PM  Clinical Narrative:     Patent requesting information and assistance with disability/Medicaid. Referred to financial counseling for discussions on these concerns.        Expected Discharge Plan and Services                                                 Social Determinants of Health (SDOH) Interventions    Readmission Risk Interventions No flowsheet data found.

## 2020-05-31 NOTE — Progress Notes (Addendum)
STROKE TEAM PROGRESS NOTE   INTERVAL HISTORY No acute events Today Mr. Sparacino remains aphasic with ability to answer some questions with non-fluent language. He denies new concerns today with had shake indicating "no".  Wife reports he quit smoking and has been with her all the time because she took FMLA to stay home for a bit. To her knowledge there has been no further cocaine abuse. UDS negative. Patient denies current use. She also relates she had a difficult time convincing him to return to hospital when he worsened.  We discussed stroke diagnosis, ongoing work up and plan of care. Wife's questions were answered. Patient appeared to be attentive to conversation but did not attempt ask questions.   Vitals:   05/31/20 0715 05/31/20 0735 05/31/20 0832 05/31/20 1100  BP: (!) 145/101 (!) 163/84 (!) 141/125 (!) 158/97  Pulse: 79 74 77 74  Resp: 16 16 14 20   Temp:   98.4 F (36.9 C) 98 F (36.7 C)  TempSrc:   Oral Axillary  SpO2: 97% 97% 97% 98%  Weight:      Height:       CBC:  Recent Labs  Lab 05/30/20 1326 05/30/20 1340 05/31/20 0330  WBC 7.5  --  6.9  NEUTROABS 5.0  --  3.7  HGB 12.7* 12.6* 11.4*  HCT 38.7* 37.0* 35.1*  MCV 79.5*  --  80.0  PLT 175  --  174   Basic Metabolic Panel:  Recent Labs  Lab 05/30/20 1326 05/30/20 1340 05/31/20 0330  NA 140 142 140  K 3.4* 3.4* 3.0*  CL 103 99 107  CO2 31  --  24  GLUCOSE 221* 215* 120*  BUN 19 17 14   CREATININE 1.50* 1.50* 1.17  CALCIUM 8.4*  --  8.2*  MG  --   --  1.5*   Lipid Panel:  Recent Labs  Lab 05/31/20 0330  CHOL 74  TRIG 55  HDL 24*  CHOLHDL 3.1  VLDL 11  LDLCALC 39   HgbA1c:  Recent Labs  Lab 05/31/20 0330  HGBA1C 10.3*   Urine Drug Screen:  Recent Labs  Lab 05/30/20 1644  LABOPIA NONE DETECTED  COCAINSCRNUR NONE DETECTED  LABBENZ NONE DETECTED  AMPHETMU NONE DETECTED  THCU NONE DETECTED  LABBARB NONE DETECTED    Alcohol Level  Recent Labs  Lab 05/30/20 1326  ETH <10    IMAGING  past 24 hours MR Brain Wo Contrast (neuro protocol)  Result Date: 05/30/2020 CLINICAL DATA:  Mental status change, unknown cause. EXAM: MRI HEAD WITHOUT CONTRAST TECHNIQUE: Multiplanar, multiecho pulse sequences of the brain and surrounding structures were obtained without intravenous contrast. COMPARISON:  Noncontrast head CT performed earlier today 05/30/2020. CT angiogram head/neck 04/21/2020. Brain MRI 04/20/2020. FINDINGS: Brain: Mild cerebral and cerebellar atrophy. Redemonstrated patchy cortical/subcortical infarcts within the mid left frontal lobe/frontal operculum, now subacute. However, new from the prior brain MRI of 04/20/2020, there are new acute infarcts within the cortical and subcortical bilateral frontal lobes, and within the left callosal body/genu (bilateral MCA and ACA territories). The largest acute infarct is present within the left callosal body/genu and measures 2 cm. Redemonstrated chronic cortical/subcortical infarct within the left parietal lobe. Redemonstrated chronic infarct within the left cerebellar hemisphere. Background moderate severe multifocal T2/FLAIR hyperintensity within the cerebral white matter, nonspecific but compatible with chronic small vessel ischemic disease. This includes chronic lacunar infarcts within the bilateral centrum semiovale and corona radiata. Redemonstrated chronic microhemorrhages within the bilateral parietal lobes. No evidence of intracranial mass.  No extra-axial fluid collection. No midline shift. Vascular: Expected proximal arterial flow voids. Skull and upper cervical spine: No focal marrow lesion. Sinuses/Orbits: Visualized orbits show no acute finding. Mild mucosal thickening within the bilateral frontal sinuses. Moderate bilateral ethmoid sinus mucosal thickening. Mild bilateral maxillary sinus mucosal thickening. IMPRESSION: Redemonstrated patchy cortical/subcortical infarcts within the mid left frontal lobe/frontal operculum, now subacute. New  from the prior brain MRI of 04/20/2020, there are small acute infarcts within the cortical and subcortical frontal lobes bilaterally, and within the left callosal body/genu (bilateral MCA and ACA territories). The largest acute infarct is present within the left callosal body/genu, measuring 2 cm. 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 Stable background severe cerebral white matter chronic small vessel ischemic disease. Stable mild generalized parenchymal atrophy. Paranasal sinus disease, as described. Electronically Signed   By: Jackey Loge DO   On: 05/30/2020 16:10   PHYSICAL EXAM Constitutional: Appears well-developed and well-nourished.  Psych: Affect appropriate to situation Eyes: No scleral injection HENT: No OP obstruction MSK: no joint deformities.  Cardiovascular: Normal rate and regular rhythm.  Respiratory: Effort normal, non-labored breathing GI: Soft.  No distension. There is no tenderness.  Skin: WDI  Neuro: Mental Status: Patient is awake, alert. Moderate severe expressive and receptive aphasia.  He is able to answer yes or no questions with head shakes and nods, follows some multi step commands without consistency.  Cranial Nerves: II: Visual Fields are full. Pupils are equal, round, and reactive to light.   III,IV, VI: EOMI without ptosis or diploplia.  V: Facial sensation is symmetric to temperature VII: Facial movement is symmetric.  VIII: hearing is intact to voice X: Uvula elevates symmetrically XI: Shoulder shrug is symmetric. XII: tongue is midline without atrophy or fasciculations.  Motor: Tone is normal. Bulk is normal. 5/5 strength was present in bilateral upper extremities, he has 4/5 weakness of his right lower extremity. Sensory: Sensation is symmetric to light touch and temperature in the arms and legs. Cerebellar: No clear ataxia but ?effort limited    ASSESSMENT/PLAN Mr. Baumler is a 45 yo male with PMH recent left MCA branch CVA a month ago in the setting of multiple uncontrolled stroke risk factors and multifocal ntracranial artherosclerosis. His PMH includes  HTN, substance use (cocaine positive last admission), DMII, and  Obesity. He presented with worsening left lower extremity weakness, dizziness, falling, and ongoing aphasia. Found to have new stroke in bilateral MCA and ACA territories.   Recurrent stroke with new acute infarcts within the cortical and subcortical bilateral frontal lobes, and within the left callosal body/genu (bilateral MCA and ACA territories) in the setting of recent Left MCA territory stroke last month,  multifocal intracranial stenosis in the setting of multiple stroke risk factors including cocaine abuse, uncontrolled hypertension and DM2, along with heavy tobacco abuse, multiple remote infarcts and significant chronic microvascular changes. Evaluation for embolic source of current CVA is underway.    HCT: No evidence for acute intracranial abnormality. Stable chronic left infarcts.   MRI: small acute infarcts within the cortical and subcortical frontal lobes bilaterally, and within the left callosal body/genu (bilateral MCA and ACA territories). The largest acute infarct is present within the left callosal body/genu, measuring 2 cm.  CTA: No LVO. 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 The severely diseased MCA branches remain perfused and grossly symmetric  at this time.  TEE is pending  A1c 10.3, LDL 39  Was on ASA 325mg  and Plavix 75mg  x 3 months prior to admission, continued here   Last month's stroke work up/plan:   04/21/20 MRA bilateral narrowing of the middle cerebral arteries  04/21/2020 CTA head & neck - Severe atheromatous disease involving the distal MCA branches bilaterally with  associated moderate to severe multifocal bilateral M2 and M3 stenoses. The severely diseased MCA branches remain perfused and grossly symmetric at this time.  04/21/20 2D Echo EF 60-65% +regional wall motion abnormalities, Moderate LVH, no shunt or thrombus  LDL 53  HgbA1c 11.1  Discharged on  DAPT with high dose ASA 325mg  and Plavix 75mg  x 3 months,   Therapy recommendations:  Disposition:  pending   Multifocal intracranial stenosis  MRA April 2022 and now with bilateral narrowing of the middle cerebral arteries  Severe atheromatous disease involving the distal MCA branches bilaterally with associated moderate to severe multifocal bilateral M2 and M3 stenoses. The severely diseased MCA branches remain perfused and grossly symmetric at the time.  The April 2022 left MCA infarct likely related to left MCA branch stenosis  On DAPT prior to admission  Hypertension  Stable  Avoid low BP  Long-term BP goal 130-150 given severe intracranial stenosis  Hyperlipidemia  Home meds: Lipitor 40mg    LDL 53, at goal < 70   Continue statin at discharge  Diabetes type II Uncontrolled  New diagnosis last month   HgbA1c 10.3, goal < 7.0  Received DM education and support from DM coordinator last month  Management per primary team.   Tobacco abuse  Has quit since last admission  Cocaine abuse   UDS positive for cocaine last month   He reports he quit    Other Stroke Risk Factors  ETOH use, alcohol level <10, advised to drink no more than 1 drink(s) a day  Obesity, Body mass index is 32.95 kg/m., BMI >/= 30 associated with increased stroke risk, recommend weight loss, diet and exercise as appropriate   Hx stroke/TIA, chronic old left cerebellar and left parietooccipital along with acute  Left MCA territory stroke noted on imaging 04/20/20  Other Active Problems Delila A Bailey-Modzik, NP-C   Hospital day # 1  ATTENDING NOTE: I reviewed above note and  agree with the assessment and plan. Pt was seen and examined.   45 year old male with history of hypertension, smoker, diabetes, obesity, cocaine use admitted for increased weakness and slurred speech.  Patient stroke in 04/2020 with aphasia, intermittent headache and dizziness.  CT showed old left cerebellum and left parieto-occipital infarct.  MRI showed left MCA patchy infarcts.  MRA and CTA head and neck showed bilateral MCA branch high-grade stenosis.  EF 60 to 65%.  LDL 53, A1c 11.1, UDS positive for cocaine.  Patient discharged with DAPT and Lipitor 40.  This admission patient MRI showed bilateral ACA scattered left more than right acute infarct.  Also had interval bilateral MCA small infarcts from last stroke.  CTA head and neck showed prominent intracranial atherosclerosis, new occlusion versus high-grade stenosis at the left A2.  High-grade stenosis bilateral MCA vascular tree with persistent occlusion of distal left M3 and worsening stenosis right M3.  A1c 10.3, LDL 39, UDS negative this time.  Creatinine 1.17.  On exam, wife at bedside, patient awake, alert, flat affect, eyes open, orientated to place and people, but not to age or month. No aphasia but paucity of speech, following all simple commands.  Able to name and repeat. Slight dysarthria. No gaze palsy, tracking bilaterally, visual field full on confrontation, PERRL. No facial droop. Tongue midline. Bilateral UEs proximal 4/5, and distal left 3/5 and right 4/5, no drift. Bilaterally LEs proximal 4+/5, and distal left 3-/5 and right 4+/5, no drift. Sensation symmetrical bilaterally, b/l FTN intact, gait not tested.   Etiology per patient stroke still more consistent with multifocal intracranial atherosclerosis in the setting of uncontrolled diabetes and hypertension, smoker and drug use.  However, throughout cardioembolic source, recommend TEE for further evaluation.  We also consider cerebral angiogram to rule out vasculitis, although low  yield.  Patient not a LP candidate at this moment given on Plavix.  May consider outpatient LP.  Continue aspirin Plavix and Lipitor at this time.  Permissive hypertension.  For detailed assessment and plan, please refer to above as I have made changes wherever appropriate.   Marvel Plan, MD PhD Stroke Neurology 05/31/2020 6:24 PM     To contact Stroke Continuity provider, please refer to [email or web address]. After hours, contact General Neurology

## 2020-05-31 NOTE — Progress Notes (Signed)
Reached out to Dr. Ferne Reus via Chat Msg: Patient just received 800 mg of magnesium po, do you still want the 2gram IV of mag hung?  He responded, yes.  Will hang IV mag.

## 2020-05-31 NOTE — ED Notes (Signed)
Report given to [facility]. 

## 2020-05-31 NOTE — ED Notes (Signed)
Pt ambulatory to restroom with standby assist

## 2020-05-31 NOTE — Evaluation (Signed)
Physical Therapy Evaluation Patient Details Name: Nathaniel Spears MRN: [id number] DOB: [birth year] Today's Date: 05/31/2020   History of Present Illness  Nathaniel Spears is a 45 yo male who presented with worsening left lower extremity weakness, dizziness, and ongoing aphasia. MRI shows new acute infarcts involving the cortical and subcortical frontal lobes bilaterally . Recently hospitalized 4/5-4/8 after having acute L middle frontal lobe CVA. PMH: CVA, HTN, substance use (cocaine positive last admission), DMII, obesity.  Clinical Impression   Patient received in bed, pleasant and cooperative. Has some expressive and receptive aphasia, but responds well to simple cues for improved communication (for example, touch which leg feels weaker) and yes/no questions. Seems to have a bit of apraxia as well. Able to mobilize with min guard/no device, but able to tell me he does not feel at his baseline. Left up in recliner with all needs met, chair alarm active. Will benefit from skilled OP PT f/u after DC.     Follow Up Recommendations Outpatient PT;Supervision/Assistance - 24 hour (neuro outpatient PT)    Equipment Recommendations  None recommended by PT    Recommendations for Other Services       Precautions / Restrictions Precautions Precautions: Fall;Other (comment) Precaution Comments: aphasia (expressive and receptive), apraxia Restrictions Weight Bearing Restrictions: No      Mobility  Bed Mobility Overal bed mobility: Independent             General bed mobility comments: increased time and effort    Transfers Overall transfer level: Needs assistance Equipment used: None Transfers: Sit to/from Stand Sit to Stand: Min guard         General transfer comment: min guard for safety, no overt LOB  Ambulation/Gait Ambulation/Gait assistance: Min guard Gait Distance (Feet): 80 Feet Assistive device: None Gait Pattern/deviations: Step-through pattern;Drifts right/left;Wide base  of support Gait velocity: decreased   General Gait Details: slow and steady in hallway, but suspect he would struggle on uneven surfaces or with dynamic challenges. Really had to take his time when turning  Stairs            Wheelchair Mobility    Modified Rankin (Stroke Patients Only)       Balance Overall balance assessment: Needs assistance;History of Falls Sitting-balance support: No upper extremity supported;Feet supported Sitting balance-Leahy Scale: Good     Standing balance support: No upper extremity supported;During functional activity Standing balance-Leahy Scale: Good                               Pertinent Vitals/Pain Pain Assessment: No/denies pain Faces Pain Scale: No hurt    Home Living Family/patient expects to be discharged to:: Private residence Living Arrangements: Spouse/significant other Available Help at Discharge: Family Type of Home: House Home Access: Stairs to enter   [employer] of Steps:  (not entirely clear) Home Layout: Two level;Bed/bath upstairs Home Equipment: None Additional Comments: Wife typically works from home but has been on [employer] for past month to assist pt    Prior Function Level of Independence: Independent         Comments: Prior to CVA in April 2022, pt was Independent in all tasks. Now, pt requires supervision for ADLs/IADLs to ensure safety due to cognitive deficits from CVA. Pt with one recent fall in [employer] parking lot due to knee buckling per pt. Pt has been receiving OP OT/SLP services.     Hand Dominance   Dominant Hand: Left  Extremity/Trunk Assessment   Upper Extremity Assessment Upper Extremity Assessment: Defer to OT evaluation    Lower Extremity Assessment Lower Extremity Assessment: Generalized weakness    Cervical / Trunk Assessment Cervical / Trunk Assessment: Normal  Communication   Communication: Expressive difficulties;Receptive difficulties  Cognition  Arousal/Alertness: Awake/alert Behavior During Therapy: WFL for tasks assessed/performed;Flat affect Overall Cognitive Status: Impaired/Different from baseline Area of Impairment: Problem solving;Safety/judgement                         Safety/Judgement: Decreased awareness of safety   Problem Solving: Slow processing;Difficulty sequencing;Requires verbal cues General Comments: recent CVA in April 2022 with expressive and receptive aphasia afterwards; seems to have a bit of apraxia as well      General Comments General comments (skin integrity, edema, etc.): SIgnificant other present and supportive, assists in providing PLOF due to pt aphasia    Exercises     Assessment/Plan    PT Assessment Patient needs continued PT services  PT Problem List Decreased strength;Decreased cognition;Decreased activity tolerance;Decreased safety awareness;Decreased balance;Decreased mobility;Decreased coordination       PT Treatment Interventions DME instruction;Balance training;Gait training;Stair training;Cognitive remediation;Neuromuscular re-education;Functional mobility training;Patient/family education;Therapeutic activities;Therapeutic exercise    PT Goals (Current goals can be found in the Care Plan section)  Acute Rehab PT Goals Patient Stated Goal: to be able to walk again, get back to normal functioning PT Goal Formulation: With patient Time For Goal Achievement: 06/14/20 Potential to Achieve Goals: Good    Frequency Min 4X/week   Barriers to discharge        Co-evaluation               AM-PAC PT "6 Clicks" Mobility  Outcome Measure Help needed turning from your back to your side while in a flat bed without using bedrails?: None Help needed moving from lying on your back to sitting on the side of a flat bed without using bedrails?: None Help needed moving to and from a bed to a chair (including a wheelchair)?: A Little Help needed standing up from a chair using  your arms (e.g., wheelchair or bedside chair)?: A Little Help needed to walk in hospital room?: A Little Help needed climbing 3-5 steps with a railing? : A Lot 6 Click Score: 19    End of Session Equipment Utilized During Treatment: Gait belt Activity Tolerance: Patient tolerated treatment well Patient left: in chair;with call bell/phone within reach;with chair alarm set Nurse Communication: Mobility status PT Visit Diagnosis: Unsteadiness on feet (R26.81);Muscle weakness (generalized) (M62.81);Difficulty in walking, not elsewhere classified (R26.2);Other symptoms and signs involving the nervous system (R29.898);History of falling (Z91.81)    Time: 9702-6378 PT Time Calculation (min) (ACUTE ONLY): 27 min   Charges:   PT Evaluation $PT Eval Moderate Complexity: 1 Mod PT Treatments $Gait Training: 8-22 mins        Windell Norfolk, DPT, PN1   Supplemental Physical Therapist [place]    Pager [phone] Acute Rehab Office [phone]

## 2020-05-31 NOTE — TOC Initial Note (Addendum)
Transition of Care [facility]) - Initial/Assessment Note    Patient Details  Name: Nathaniel Spears MRN: [id number] Date of Birth: 1975-04-04  Transition of [facility]) CM/SW Contact:    Lockie Pares, RN Phone Number: 05/31/2020, 1:38 PM  Clinical Narrative:                 45 year old admitted with CVA, has previous CVA, diabetes. Had stopped taking medicines for a couple of days. HBg1C IN 10 RANGE.  Lives with wife. OT recommending outpatient therapy. Will await PT recommendations for any DME. Consulted fiancial services for patient requests of application for medicaid and disability services. Cm will follow for any needs.   Expected Discharge Plan: Home w Home Health Services Barriers to Discharge: Continued Medical Work up   Patient Goals and CMS Choice        Expected Discharge Plan and Services Expected Discharge Plan: Home w Home Health Services   Discharge Planning Services: CM Consult   Living arrangements for the past 2 months: Single Family Home                                      Prior Living Arrangements/Services Living arrangements for the past 2 months: Single Family Home Lives with:: Spouse Patient language and need for interpreter reviewed:: Yes        Need for Family Participation in Patient Care: Yes (Comment) Care giver support system in place?: Yes (comment)   Criminal Activity/Legal Involvement Pertinent to Current Situation/Hospitalization: No - Comment as needed  Activities of Daily Living      Permission Sought/Granted                  Emotional Assessment       Orientation: : Oriented to Self,Oriented to Place Alcohol / Substance Use: Not Applicable Psych Involvement: No (comment)  Admission diagnosis:  Acute CVA (cerebrovascular accident) (HCC) [I63.9] Cerebrovascular accident (CVA), unspecified mechanism (HCC) [I63.9] Patient Active Problem List   Diagnosis Date Noted  . CVA (cerebral vascular accident) (HCC)  04/21/2020  . Aphasia   . Polysubstance abuse (HCC)   . Type 2 diabetes mellitus without complication (HCC)   . Hypokalemia   . Acute CVA (cerebrovascular accident) (HCC) 04/20/2020  . Essential hypertension 08/21/2019  . Obesity 08/21/2019   PCP:  Grayce Sessions, NP Pharmacy:   [facility] [address] [place] [address] Phone: [phone] Fax: [phone]     Social Determinants of Health (SDOH) Interventions    Readmission Risk Interventions No flowsheet data found.

## 2020-05-31 NOTE — Progress Notes (Signed)
Today the patient's wife reported that since this 2nd stroke she is noticing that he is having eye/hand coordination difficulties.

## 2020-05-31 NOTE — Progress Notes (Signed)
    [facility] has been requested to perform a transesophageal echocardiogram on MR. Liebig for CVA.  After careful review of history and examination, the risks and benefits of transesophageal echocardiogram have been explained including risks of esophageal damage, perforation (1:10,000 risk), bleeding, pharyngeal hematoma as well as other potential complications associated with conscious sedation including aspiration, arrhythmia, respiratory failure and death. Alternatives to treatment were discussed, questions were answered. Patient is willing to proceed.  TEE - Dr. Bjorn Pippin @ 1pm.  NPO after midnight. Meds with sips.   Manson Passey, PA-C 05/31/2020 4:36 PM

## 2020-05-31 NOTE — Evaluation (Addendum)
Occupational Therapy Evaluation Patient Details Name: Nathaniel Spears MRN: [id number] DOB: 24-Aug-1975 Today's Date: 05/31/2020    History of Present Illness Nathaniel Spears is a 45 yo male who presented with worsening left lower extremity weakness, dizziness, and ongoing aphasia. MRI shows new acute infarcts involving the cortical and subcortical frontal lobes bilaterally . Recently hospitalized 4/5-4/8 after having acute L middle frontal lobe CVA. PMH: CVA, HTN, substance use (cocaine positive last admission), DMII, obesity.   Clinical Impression   PTA, pt lives with significant other and has been completing ADLs and mobility without AD at Supervision level since recent discharge for CVA due to cognitive deficits. Pt reports one recent fall in [employer] parking lot, citing knee buckling as cause of fall. Pt presents now with diagnoses above and presenting close to most recent baseline. Pt is active with OP SLP/OT services. B UE strength appears functional and symmetrical with triceps slightly weaker than shoulders/biceps. Coordination intact. Pt overall min guard for mobility to/from bathroom without AD and without overt LOB. Pt overall Supervision for UB/LB ADLs with intermittent cues needed due to getting "stuck" when sequencing multi step tasks. Will continue to follow pt at acute level and recommend continuation of OP neuro therapy at discharge.     Follow Up Recommendations  Outpatient OT;Supervision - Intermittent (continue neuro OP therapy services)    Equipment Recommendations  None recommended by OT    Recommendations for Other Services       Precautions / Restrictions Precautions Precautions: Fall;Other (comment) Precaution Comments: aphasia Restrictions Weight Bearing Restrictions: No      Mobility Bed Mobility Overal bed mobility: Independent                  Transfers Overall transfer level: Needs assistance Equipment used: None Transfers: Sit to/from Stand Sit to  Stand: Min guard         General transfer comment: min guard for safety, no overt LOB    Balance Overall balance assessment: Needs assistance Sitting-balance support: No upper extremity supported;Feet supported Sitting balance-Leahy Scale: Good     Standing balance support: No upper extremity supported;During functional activity Standing balance-Leahy Scale: Good                             ADL either performed or assessed with clinical judgement   ADL Overall ADL's : Needs assistance/impaired Eating/Feeding: Set up;Sitting   Grooming: Supervision/safety;Standing;Oral care Grooming Details (indicate cue type and reason): Supervision with min verbal cues for sequencing as pt appeared "stuck" at times, difficulty figuring out where to place toothpaste cap and initiating turning on water to rinse Upper Body Bathing: Supervision/ safety;Standing   Lower Body Bathing: Supervison/ safety;Sit to/from stand   Upper Body Dressing : Supervision/safety;Standing   Lower Body Dressing: Supervision/safety;Sit to/from stand   Toilet Transfer: Min guard;Ambulation;Regular Teacher, adult education Details (indicate cue type and reason): min guard for safety, no overt LOB without AD Toileting- Clothing Manipulation and Hygiene: Supervision/safety;Sit to/from stand Toileting - Clothing Manipulation Details (indicate cue type and reason): Supervision for safety with urination task in standing     Functional mobility during ADLs: Min guard General ADL Comments: Pt with most deficits noted in cognition and speech, benefits from cues for sequencing ADL tasks.     Vision Patient Visual Report: No change from baseline Vision Assessment?: Vision impaired- to be further tested in functional context Additional Comments: some difficulty locating items     Perception  Praxis      Pertinent Vitals/Pain Pain Assessment: Faces Faces Pain Scale: No hurt     Hand Dominance Left    Extremity/Trunk Assessment Upper Extremity Assessment Upper Extremity Assessment: Overall WFL for tasks assessed (Triceps and grip strength 4/5, biceps/shoulder 4+/5, coordination WFL)   Lower Extremity Assessment Lower Extremity Assessment: Defer to PT evaluation   Cervical / Trunk Assessment Cervical / Trunk Assessment: Normal   Communication Communication Communication: Expressive difficulties   Cognition Arousal/Alertness: Awake/alert Behavior During Therapy: WFL for tasks assessed/performed Overall Cognitive Status: Impaired/Different from baseline Area of Impairment: Problem solving                             Problem Solving: Slow processing;Difficulty sequencing;Requires verbal cues General Comments: Pt with recent CVA 4/21 with aphasia and deficits in sequencing/problem solving. Pt appears "stuck" at times when completing ADLs, quickly gets back on track with verbal cues   General Comments  SIgnificant other present and supportive, assists in providing PLOF due to pt aphasia    Exercises     Shoulder Instructions      Home Living Family/patient expects to be discharged to:: Private residence Living Arrangements: Spouse/significant other Available Help at Discharge: Family Type of Home: House Home Access: Stairs to enter     Home Layout: Two level;Bed/bath upstairs Alternate Level Stairs-Number of Steps: Flight   Bathroom Shower/Tub: Chief Strategy Officer: Standard     Home Equipment: None   Additional Comments: Wife typically works from home but has been on [employer] for past month to assist pt      Prior Functioning/Environment Level of Independence: Independent        Comments: Prior to CVA in April 2022, pt was Independent in all tasks. Now, pt requires supervision for ADLs/IADLs to ensure safety due to cognitive deficits from CVA. Pt with one recent fall in [employer] parking lot due to knee buckling per pt. Pt has been receiving OP  OT/SLP services.        OT Problem List: Decreased strength;Decreased activity tolerance;Decreased coordination;Decreased cognition;Impaired vision/perception      OT Treatment/Interventions: Self-care/ADL training;Therapeutic exercise;DME and/or AE instruction;Therapeutic activities;Patient/family education;Balance training    OT Goals(Current goals can be found in the care plan section) Acute Rehab OT Goals Patient Stated Goal: to be able to walk again, get back to normal functioning OT Goal Formulation: With patient/family Time For Goal Achievement: 06/14/20 Potential to Achieve Goals: Good ADL Goals Pt Will Perform Grooming: standing;Independently Pt Will Perform Lower Body Dressing: sit to/from stand;Independently Pt Will Transfer to Toilet: Independently;ambulating Pt Will Perform Tub/Shower Transfer: Tub transfer;Independently;ambulating Additional ADL Goal #1: Pt to demonstrate ability to complete 2-3 step ADLs without verbal cues for sequencing  OT Frequency: Min 2X/week   Barriers to D/C:            Co-evaluation              AM-PAC OT "6 Clicks" Daily Activity     Outcome Measure Help from another person eating meals?: A Little Help from another person taking care of personal grooming?: A Little Help from another person toileting, which includes using toliet, bedpan, or urinal?: A Little Help from another person bathing (including washing, rinsing, drying)?: A Little Help from another person to put on and taking off regular upper body clothing?: A Little Help from another person to put on and taking off regular lower body clothing?: A Little 6  Click Score: 18   End of Session Equipment Utilized During Treatment: Gait belt Nurse Communication: Mobility status  Activity Tolerance: Patient tolerated treatment well Patient left: in bed;with call bell/phone within reach;with bed alarm set;with family/visitor present  OT Visit Diagnosis: Muscle weakness  (generalized) (M62.81);Other symptoms and signs involving cognitive function;Cognitive communication deficit (R41.841) Symptoms and signs involving cognitive functions: Cerebral infarction                Time: 0350-0938 OT Time Calculation (min): 19 min Charges:  OT General Charges $OT Visit: 1 Visit OT Evaluation $OT Eval Low Complexity: 1 Low  Bradd Canary, OTR/L Acute Rehab Services Office: [phone]  Lorre Munroe 05/31/2020, 1:21 PM

## 2020-05-31 NOTE — Consult Note (Signed)
Neurology Consultation Reason for Consult: Stroke Referring Physician: Giurguis, S  CC: Stroke  History is obtained from: Patient, chart  HPI: Nathaniel Spears is a 45 y.o. male with a history of previous stroke that occurred early in April.  He had a left MCA branch stroke in the setting of cocaine use.  He was discharged on aspirin and Plavix.  He had multifocal stenosis on his CT angio of his head and this was felt to be due to intracranial atherosclerosis.  He now returns with increasing weakness and more difficulty with speech.  Due to this an MRI was obtained which now demonstrates ischemia in the ACA distribution as well as his previous MCA stroke.   LKW: Unclear, several days ago tpa given?: no, outside of window, recent stroke    ROS:  Unable to obtain due to altered mental status.   Past Medical History:  Diagnosis Date  . Hypertension      FHx:  Unable to obtain due to altered mental status.  Social History: He denies any cocaine use since discharge   Exam: Current vital signs: BP (!) 165/99   Pulse 80   Temp 98.5 F (36.9 C) (Oral)   Resp 18   Ht 6\' 1"  (1.854 m)   Wt 114.6 kg   SpO2 97%   BMI 33.33 kg/m  Vital signs in last 24 hours: Temp:  [98.5 F (36.9 C)] 98.5 F (36.9 C) (05/16 09-02-2004) Pulse Rate:  [72-84] 80 (05/16 0608) Resp:  [13-26] 18 (05/16 0608) BP: (148-198)/(97-115) 165/99 (05/16 0608) SpO2:  [94 %-100 %] 97 % (05/16 09-02-2004) Weight:  [114.6 kg] 114.6 kg (05/15 1349)   Physical Exam  Constitutional: Appears well-developed and well-nourished.  Psych: Affect appropriate to situation Eyes: No scleral injection HENT: No OP obstruction MSK: no joint deformities.  Cardiovascular: Normal rate and regular rhythm.  Respiratory: Effort normal, non-labored breathing GI: Soft.  No distension. There is no tenderness.  Skin: WDI  Neuro: Mental Status: Patient is awake, alert, he has a fairly significant aphasia.  He has perseveration.  He is able  to answer yes or no questions with head shakes and nods, but with some perseveration. Cranial Nerves: II: Visual Fields are full. Pupils are equal, round, and reactive to light.   III,IV, VI: EOMI without ptosis or diploplia.  V: Facial sensation is symmetric to temperature VII: Facial movement is symmetric.  VIII: hearing is intact to voice X: Uvula elevates symmetrically XI: Shoulder shrug is symmetric. XII: tongue is midline without atrophy or fasciculations.  Motor: Tone is normal. Bulk is normal. 5/5 strength was present in bilateral upper extremities, he has 4/5 weakness of his right lower extremity. Sensory: Sensation is symmetric to light touch and temperature in the arms and legs. Deep Tendon Reflexes: 2+ and symmetric in the biceps and patellae.  Cerebellar: No clear ataxia   I have reviewed labs in epic and the results pertinent to this consultation are: BMP-potassium 3.0, otherwise unremarkable LDL 39 .  I have reviewed the images obtained:MRI brain - subacute MCA and acute ACA ischemia on the left. Also some smaller other infarcts.   Impression: 45 yo M with recurrent ischemia. 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 I would favor being able to directly compare with the same modality and therefore recommend getting a CTA head and neck.   Recommendations: - HgbA1c, fasting lipid panel -  Frequent neuro checks - Echocardiogram - CTA head and neck . - Prophylactic therapy-continue dual antiplatelets for now. - Risk factor modification - Telemetry monitoring - PT consult, OT consult, Speech consult - Stroke team to follow    Ritta Slot, MD [facility] [phone]  If 7pm- 7am, please page neurology on call as listed in AMION.

## 2020-06-01 ENCOUNTER — Inpatient Hospital Stay (HOSPITAL_COMMUNITY): Payer: Medicaid Other | Admitting: Anesthesiology

## 2020-06-01 ENCOUNTER — Inpatient Hospital Stay (HOSPITAL_COMMUNITY): Payer: Medicaid Other

## 2020-06-01 ENCOUNTER — Ambulatory Visit: Payer: Medicaid Other

## 2020-06-01 ENCOUNTER — Encounter (HOSPITAL_COMMUNITY): Payer: Self-pay | Admitting: Internal Medicine

## 2020-06-01 ENCOUNTER — Encounter (HOSPITAL_COMMUNITY)
Admission: EM | Disposition: A | Discharge status: Home Health | Payer: Self-pay | Source: Home / Self Care | Attending: Internal Medicine

## 2020-06-01 ENCOUNTER — Ambulatory Visit: Payer: Medicaid Other | Admitting: Occupational Therapy

## 2020-06-01 DIAGNOSIS — I639 Cerebral infarction, unspecified: Secondary | ICD-10-CM

## 2020-06-01 DIAGNOSIS — E119 Type 2 diabetes mellitus without complications: Secondary | ICD-10-CM

## 2020-06-01 DIAGNOSIS — I10 Essential (primary) hypertension: Secondary | ICD-10-CM

## 2020-06-01 HISTORY — PX: TEE WITHOUT CARDIOVERSION: SHX5443

## 2020-06-01 HISTORY — PX: BUBBLE STUDY: SHX6837

## 2020-06-01 LAB — GLUCOSE, CAPILLARY
Glucose-Capillary: 129 mg/dL — ABNORMAL HIGH (ref 70–99)
Glucose-Capillary: 99 mg/dL (ref 70–99)
Glucose-Capillary: 112 mg/dL — ABNORMAL HIGH (ref 70–99)
Glucose-Capillary: 91 mg/dL (ref 70–99)

## 2020-06-01 LAB — CBC WITH DIFFERENTIAL/PLATELET
Abs Immature Granulocytes: 0.01 10*3/uL (ref 0.00–0.07)
Basophils Absolute: 0 10*3/uL (ref 0.0–0.1)
Basophils Relative: 1 %
Eosinophils Absolute: 0.2 10*3/uL (ref 0.0–0.5)
Eosinophils Relative: 3 %
HCT: 36.8 % — ABNORMAL LOW (ref 39.0–52.0)
Hemoglobin: 11.8 g/dL — ABNORMAL LOW (ref 13.0–17.0)
Immature Granulocytes: 0 %
Lymphocytes Relative: 37 %
Lymphs Abs: 2.5 10*3/uL (ref 0.7–4.0)
MCH: 25.6 pg — ABNORMAL LOW (ref 26.0–34.0)
MCHC: 32.1 g/dL (ref 30.0–36.0)
MCV: 79.8 fL — ABNORMAL LOW (ref 80.0–100.0)
Monocytes Absolute: 0.6 10*3/uL (ref 0.1–1.0)
Monocytes Relative: 8 %
Neutro Abs: 3.5 10*3/uL (ref 1.7–7.7)
Neutrophils Relative %: 51 %
Platelets: 200 10*3/uL (ref 150–400)
RBC: 4.61 MIL/uL (ref 4.22–5.81)
RDW: 12.3 % (ref 11.5–15.5)
WBC: 6.8 10*3/uL (ref 4.0–10.5)
nRBC: 0 % (ref 0.0–0.2)

## 2020-06-01 LAB — BASIC METABOLIC PANEL
Anion gap: 7 (ref 5–15)
BUN: 10 mg/dL (ref 6–20)
CO2: 25 mmol/L (ref 22–32)
Calcium: 8.5 mg/dL — ABNORMAL LOW (ref 8.9–10.3)
Chloride: 108 mmol/L (ref 98–111)
Creatinine, Ser: 1.12 mg/dL (ref 0.61–1.24)
GFR, Estimated: >60 mL/min (ref >60)
Glucose, Bld: 117 mg/dL — ABNORMAL HIGH (ref 70–99)
Potassium: 3.3 mmol/L — ABNORMAL LOW (ref 3.5–5.1)
Sodium: 140 mmol/L (ref 135–145)

## 2020-06-01 LAB — MAGNESIUM: Magnesium: 1.8 mg/dL (ref 1.7–2.4)

## 2020-06-01 SURGERY — ECHOCARDIOGRAM, TRANSESOPHAGEAL
Anesthesia: Monitor Anesthesia Care

## 2020-06-01 MED ORDER — GLYCOPYRROLATE 0.2 MG/ML IJ SOLN
INTRAMUSCULAR | Status: DC | PRN
  Administered 2020-06-01 (×2): .1 mg via INTRAVENOUS

## 2020-06-01 MED ORDER — SODIUM CHLORIDE 0.9 % IV SOLN: INTRAVENOUS | Status: DC | PRN

## 2020-06-01 MED ORDER — SODIUM CHLORIDE 0.9 % IV SOLN: INTRAVENOUS | Status: DC

## 2020-06-01 MED ORDER — POTASSIUM CHLORIDE CRYS ER 20 MEQ PO TBCR
40.0000 meq | EXTENDED_RELEASE_TABLET | ORAL | Status: AC
  Administered 2020-06-01 (×2): 40 meq via ORAL
  Filled 2020-06-01 (×2): qty 2

## 2020-06-01 MED ORDER — PROPOFOL 10 MG/ML IV BOLUS
INTRAVENOUS | Status: DC | PRN
  Administered 2020-06-01: 30 mg via INTRAVENOUS
  Administered 2020-06-01: 20 mg via INTRAVENOUS

## 2020-06-01 MED ORDER — PROPOFOL 500 MG/50ML IV EMUL
INTRAVENOUS | Status: DC | PRN
  Administered 2020-06-01: 125 ug/kg/min via INTRAVENOUS

## 2020-06-01 MED ORDER — BUTAMBEN-TETRACAINE-BENZOCAINE 2-2-14 % EX AERO
INHALATION_SPRAY | CUTANEOUS | Status: DC | PRN
  Administered 2020-06-01: 2 via TOPICAL

## 2020-06-01 NOTE — Interval H&P Note (Signed)
History and Physical Interval Note:  06/01/2020 12:40 PM  Nathaniel Spears  has presented today for surgery, with the diagnosis of STROKE.  The various methods of treatment have been discussed with the patient and family. After consideration of risks, benefits and other options for treatment, the patient has consented to  Procedure(s): TRANSESOPHAGEAL ECHOCARDIOGRAM (TEE) (N/A) as a surgical intervention.  The patient's history has been reviewed, patient examined, no change in status, stable for surgery.  I have reviewed the patient's chart and labs.  Questions were answered to the patient's satisfaction.     Little Ishikawa

## 2020-06-01 NOTE — CV Procedure (Addendum)
     TRANSESOPHAGEAL ECHOCARDIOGRAM   NAME:  Nathaniel Spears   MRN: [id number] DOB:  11-Apr-1975   ADMIT DATE: 05/30/2020  INDICATIONS: CVA  PROCEDURE:   Informed consent was obtained prior to the procedure. The risks, benefits and alternatives for the procedure were discussed and the patient comprehended these risks.  Risks include, but are not limited to, cough, sore throat, vomiting, nausea, somnolence, esophageal and stomach trauma or perforation, bleeding, low blood pressure, aspiration, pneumonia, infection, trauma to the teeth and death.    After a procedural time-out, the oropharynx was anesthetized and the patient was sedated by the anesthesia service. The transesophageal probe was inserted in the esophagus and stomach without difficulty and multiple views were obtained. Anesthesia was monitored by Dr Malen Gauze and Janann Colonel, CRNA.    COMPLICATIONS:    Noted to have oropharyngeal bleeding during procedure that was suctioned.  No further bleeding noted in recovery area. There were otherwise no immediate complications.  FINDINGS:  No cardiac source of embolism seen.  Negative bubble study.  Epifanio Lesches MD [facility]  [address] [place], [place] [address] [phone]   1:37 PM

## 2020-06-01 NOTE — Evaluation (Signed)
Speech Language Pathology Evaluation Patient Details Name: Nathaniel Spears MRN: [id number] DOB: 07-Nov-1975 Today's Date: 06/01/2020 Time: 1683-7290 SLP Time Calculation (min) (ACUTE ONLY): 15 min  Problem List:  Patient Active Problem List   Diagnosis Date Noted  . CVA (cerebral vascular accident) (HCC) 04/21/2020  . Aphasia   . Polysubstance abuse (HCC)   . Type 2 diabetes mellitus without complication (HCC)   . Hypokalemia   . Acute CVA (cerebrovascular accident) (HCC) 04/20/2020  . Essential hypertension 08/21/2019  . Obesity 08/21/2019   Past Medical History:  Past Medical History:  Diagnosis Date  . Hypertension    Past Surgical History: History reviewed. No pertinent surgical history. HPI:  New from the prior brain MRI of 04/20/2020, there are small acute  infarcts within the cortical and subcortical frontal lobes  bilaterally, and within the left callosal body/genu (bilateral MCA  and ACA territories). The largest acute infarct is present within  the left callosal body/genu, measuring 2 cm   Assessment / Plan / Recommendation Clinical Impression  Pt with residual Broca's aphasia following recent previous stroke (04/2020). He was receiving outpatient SLP therapy prior to this admission. Pt continues to exhibit deficits in both receptive and expressive aphasia consistent with Broca's aphasia. Pt has exhibited some changes in cognitive skills this admission including reduced safety awarenss, reduced thought organization, and decreased problem solving necessitating use of bed alarm and increased support from staff.  No apparent dysarthria of speech was exhibited. Pt with supportive significant other and brother at bedside. Ongoing ST evalution and tx indicated as evaluation was cut short for pt procedure. SLP to follow up.    SLP Assessment  SLP Recommendation/Assessment: Patient needs continued Speech Lanaguage Pathology Services SLP Visit Diagnosis: Aphasia (R47.01);Cognitive  communication deficit (R41.841)    Follow Up Recommendations  Outpatient SLP    Frequency and Duration min 2x/week  2 weeks      SLP Evaluation Cognition  Overall Cognitive Status: Impaired/Different from baseline Arousal/Alertness: Awake/alert Orientation Level: Oriented X4 Problem Solving: Impaired Executive Function: Organizing;Sequencing Sequencing: Impaired Organizing: Impaired Behaviors: Lability Safety/Judgment: Impaired       Comprehension  Auditory Comprehension Overall Auditory Comprehension: Impaired at baseline EffectiveTechniques: Slowed speech;Pausing;Extra processing time;Repetition    Expression Expression Primary Mode of Expression: Other (comment) (combination of verbal and nonverbal) Verbal Expression Overall Verbal Expression: Impaired at baseline Initiation: Impaired Level of Generative/Spontaneous Verbalization: Phrase Repetition: Impaired Level of Impairment: Phrase level Naming: Impairment Verbal Errors: Neologisms;Aware of errors;Semantic paraphasias;Phonemic paraphasias Effective Techniques: Sentence completion;Open ended questions;Phonemic cues;Written cues Written Expression Dominant Hand: Left   Oral / Motor  Motor Speech Overall Motor Speech: Appears within functional limits for tasks assessed   GO                    Ardyth Gal MA, CCC-SLP Acute Rehabilitation Services   06/01/2020, 1:02 PM

## 2020-06-01 NOTE — Progress Notes (Signed)
Dr. Jerene Pitch in to look at patient. No more bleeding from the mouth or throat. Dr. Jerene Pitch attempted to look in back of throat. No problems seen at this time.

## 2020-06-01 NOTE — Progress Notes (Addendum)
[facility] PROGRESS NOTE   Nathaniel Spears ONG:[id number] DOB: October 31, 1975 DOA: 05/30/2020  PCP: Grayce Sessions, NP  Brief History/Interval Summary: 45 yo male with PMH recent CVA (just discharged 04/23/20), HTN, substance use (cocaine positive last admission), DMII, obesitywho presented with worsening left lower extremity weakness, dizziness, falling, and ongoing aphasia. 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 Patient was hospitalized for further management.  Seen by neurology.  Plan is for TEE today.  Reason for Visit: Acute stroke  Consultants: Neurology  Procedures: TEE is planned for today  Antibiotics: Anti-infectives (From admission, onward)   None      Subjective/Interval History: Patient noted to be emotional this morning.  States that he does not feel well but unable to specify what it is that is bothering him.  Denies any pain anywhere.  Per nursing aide patient has been a bit more confused.    Assessment/Plan:  Acute stroke Patient with recent hospitalization with acute stroke.  MRI brain done during this admission again shows new infarcts involving the cortical and subcortical frontal lobes.  Some emotional lability noted today which could be due to these infarcts. Patient also underwent CTA.  Vascular irregularities noted.  Will defer to neurology Neurology was consulted.  Plan is for TEE today.  Patient remains on aspirin and Plavix. LDL 39.  Remains on statin.  HbA1c 10.3. PT OT SLP.  Urine drug screen was negative. Echocardiogram done in April showed normal systolic function with grade 1 diastolic dysfunction.  Hypokalemia This will be repleted.  Magnesium 1.8.  Essential hypertension Permissive hypertension allowed.  Monitor blood pressures closely.  Holding antihypertensives.  Diabetes mellitus type 2, uncontrolled  with hyperglycemia HbA1c 10.3.  Continue SSI.  Lantus is currently on hold.  CBGs are reasonably well controlled.  Normocytic anemia No evidence of overt bleeding.  Continue to monitor.  Outpatient evaluation.  Obesity Estimated body mass index is 33.33 kg/m as calculated from the following:   Height as of this encounter:  (1.854 m).   Weight as of this encounter: 114.6 kg.   DVT Prophylaxis: Lovenox Code Status: Full code Family Communication: His fiance is not at the bedside today Disposition Plan: Hopefully return home when improved  Status is: Inpatient  Remains inpatient appropriate because:Ongoing diagnostic testing needed not appropriate for outpatient work up and Inpatient level of care appropriate due to severity of illness   Dispo:  Patient From: Home  Planned Disposition: Home  Medically stable for discharge: No         Medications:  Scheduled: .  stroke: mapping our early stages of recovery book   Does not apply Once  . aspirin  325 mg Oral Daily  . atorvastatin  40 mg Oral Daily  . clopidogrel  75 mg Oral Daily  . enoxaparin (LOVENOX) injection  40 mg Subcutaneous Q24H  . insulin aspart  0-20 Units Subcutaneous TID AC & HS  . potassium chloride  40 mEq Oral Q4H   Continuous:  GMW:NUUVOZDGUYQIH **OR** acetaminophen (TYLENOL) oral liquid 160 mg/5 mL **OR** acetaminophen, hydrALAZINE, labetalol   Objective:  Vital Signs  Vitals:   05/31/20 1647 05/31/20 2009 06/01/20 0050 06/01/20 0404  BP: (!) 124/99 (!) 154/99 (!) 160/104 (!) 156/94  Pulse: 76 73 74 72  Resp: Temp:  98.7 F (37.1 C) 98 F (36.7 C)  98.1 F (36.7 C)  TempSrc:  Oral Oral Oral  SpO2: 98%  98% 93%  Weight:      Height:        Intake/Output Summary (Last 24 hours) at 06/01/2020 0858 Last data filed at 05/31/2020 2218 Gross per 24 hour  Intake 1863.9 ml  Output --  Net 1863.9 ml   Filed Weights   05/30/20 1349  Weight: 114.6 kg    General appearance:  Awake alert.  In no distress.  Noted to be anxious and crying this morning Resp: Clear to auscultation bilaterally.  Normal effort Cardio: S1-S2 is normal regular.  No S3-S4.  No rubs murmurs or bruit GI: Abdomen is soft.  Nontender nondistended.  Bowel sounds are present normal.  No masses organomegaly Extremities: No edema.  Full range of motion of lower extremities. Neurologic: Expressive aphasia noted.  Able to move all of his extremities without difficulty   Lab Results:  Data Reviewed: I have personally reviewed following labs and imaging studies  CBC: Recent Labs  Lab 05/30/20 1326 05/30/20 1340 05/31/20 0330 06/01/20 0059  WBC 7.5  --  6.9 6.8  NEUTROABS 5.0  --  3.7 3.5  HGB 12.7* 12.6* 11.4* 11.8*  HCT 38.7* 37.0* 35.1* 36.8*  MCV 79.5*  --  80.0 79.8*  PLT 175  --  174 200    Basic Metabolic Panel: Recent Labs  Lab 05/30/20 1326 05/30/20 1340 05/31/20 0330 06/01/20 0059  NA 140 142 140 140  K 3.4* 3.4* 3.0* 3.3*  CL 103 99 107 108  CO2 31  --  24 25  GLUCOSE 221* 215* 120* 117*  BUN 19 17 14 10   CREATININE 1.50* 1.50* 1.17 1.12  CALCIUM 8.4*  --  8.2* 8.5*  MG  --   --  1.5* 1.8    GFR: Estimated Creatinine Clearance: 110.5 mL/min (by C-G formula based on SCr of 1.12 mg/dL).  Liver Function Tests: Recent Labs  Lab 05/30/20 1326  AST 14*  ALT 14  ALKPHOS 69  BILITOT 0.4  PROT 7.3  ALBUMIN 3.7     Recent Labs  Lab 05/30/20 1328  AMMONIA 38*    Coagulation Profile: Recent Labs  Lab 05/30/20 1326  INR 1.1     HbA1C: Recent Labs    05/31/20 0330  HGBA1C 10.3*    CBG: Recent Labs  Lab 05/31/20 0859 05/31/20 1150 05/31/20 1708 05/31/20 2005 06/01/20 0746  GLUCAP 122* 148* 154* 137* 129*    Lipid Profile: Recent Labs    05/31/20 0330  CHOL 74  HDL 24*  LDLCALC 39  TRIG 55  CHOLHDL 3.1      Recent Results (from the past 240 hour(s))  Resp Panel by RT-PCR (Flu A&B, Covid) Nasopharyngeal Swab     Status: None    Collection Time: 05/30/20  1:55 PM   Specimen: Nasopharyngeal Swab; Nasopharyngeal(NP) swabs in vial transport medium  Result Value Ref Range Status   SARS Coronavirus 2 by RT PCR NEGATIVE NEGATIVE Final    Comment: (NOTE) SARS-CoV-2 target nucleic acids are NOT DETECTED.  The SARS-CoV-2 RNA is generally detectable in upper respiratory specimens during the acute phase of infection. The lowest concentration of SARS-CoV-2 viral copies this assay can detect is 138 copies/mL. A negative result does not preclude SARS-Cov-2 infection and should not be used as the sole basis for treatment or other patient management decisions. A negative result may occur with  improper specimen collection/handling, submission of specimen other than nasopharyngeal swab,  presence of viral mutation(s) within the areas targeted by this assay, and inadequate number of viral copies(<138 copies/mL). A negative result must be combined with clinical observations, patient history, and epidemiological information. The expected result is Negative.  Fact Sheet for Patients:  BloggerCourse.com  Fact Sheet for Healthcare Providers:  SeriousBroker.it  This test is no t yet approved or cleared by the [place] FDA and  has been authorized for detection and/or diagnosis of SARS-CoV-2 by FDA under an Emergency Use Authorization (EUA). This EUA will remain  in effect (meaning this test can be used) for the duration of the COVID-19 declaration under Section 564(b)(1) of the Act, 21 U.S.C.section 360bbb-3(b)(1), unless the authorization is terminated  or revoked sooner.       Influenza A by PCR NEGATIVE NEGATIVE Final   Influenza B by PCR NEGATIVE NEGATIVE Final    Comment: (NOTE) The Xpert Xpress SARS-CoV-2/FLU/RSV plus assay is intended as an aid in the diagnosis of influenza from Nasopharyngeal swab specimens and should not be used as a sole basis for treatment.  Nasal washings and aspirates are unacceptable for Xpert Xpress SARS-CoV-2/FLU/RSV testing.  Fact Sheet for Patients: BloggerCourse.com  Fact Sheet for Healthcare Providers: SeriousBroker.it  This test is not yet approved or cleared by the [place] FDA and has been authorized for detection and/or diagnosis of SARS-CoV-2 by FDA under an Emergency Use Authorization (EUA). This EUA will remain in effect (meaning this test can be used) for the duration of the COVID-19 declaration under Section 564(b)(1) of the Act, 21 U.S.C. section 360bbb-3(b)(1), unless the authorization is terminated or revoked.  Performed at [facility], [address], [place], [place] [address]       Radiology Studies: CT ANGIO HEAD NECK W WO CM  Result Date: 05/31/2020 CLINICAL DATA:  Focal neurological deficit CT, more than 6 hours, stroke suspected. EXAM: CT ANGIOGRAPHY HEAD AND NECK TECHNIQUE: Multidetector CT imaging of the head and neck was performed using the standard protocol during bolus administration of intravenous contrast. Multiplanar CT image reconstructions and MIPs were obtained to evaluate the vascular anatomy. Carotid stenosis measurements (when applicable) are obtained utilizing NASCET criteria, using the distal internal carotid diameter as the denominator. CONTRAST:  11mL OMNIPAQUE IOHEXOL 350 MG/ML SOLN COMPARISON:  MRI of the brain May 30, 2020 FINDINGS: CT HEAD FINDINGS Brain: Multiple hypodense foci are seen along the bilateral ACA and left MCA territory, corresponding to infarct seen on recent MRI of the brain. Multiple additional hypodense foci are seen in the bilateral cerebral hemispheres, corresponding to chronic infarcts. Area as of encephalomalacia and gliosis in the left parietooccipital region and inferior left cerebellar hemisphere. No hemorrhage, hydrocephalus, extra-axial collection or mass lesion. Vascular: No  hyperdense vessel. Skull: Normal. Negative for fracture or focal lesion. Sinuses: Mucosal thickening throughout the paranasal sinuses. Orbits: No acute finding. 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 Image portion shows no evidence of aneurysm or dissection. No significant stenosis at the origin of the major arch vessels. Right carotid system: No evidence of dissection, stenosis or occlusion. Left carotid system: No evidence of dissection, stenosis or occlusion. Vertebral arteries: Left dominant. No evidence of dissection, stenosis (50% or greater) or occlusion. Skeleton: Degenerative changes of the cervical spine at C5-6 and C6-7. No acute or aggressive process identified. Other neck: Mildly prominent  bilateral cervical lymph nodes, nonspecific. May be reactive. Prominent adenoid tissue. Upper chest: Negative. Review of the MIP images confirms the above findings CTA HEAD FINDINGS Anterior circulation: Intracranial bilateral internal carotid arteries abnormal course and caliber. Bilateral A1 segments are patent noting a hypoplastic right A1/ACA. Interval development of a new focal occlusion versus high-grade stenosis at the mid A2 segment of the left anterior cerebral artery with normal distal opacification. Luminal irregularity is noted along the bilateral ACA vascular trees. Interval worsening of bilateral multifocal areas of stenosis of the bilateral MCA vascular tree preserving the bilateral M1 segments with severe stenosis at the proximal left M2 segment and persistent occlusion at the distal left M3/MCA superior division branch. Severe stenosis at the proximal right M2/MCA superior division branch and M3/MCA posterior division branches. Posterior circulation: The intracranial bilateral vertebral arteries and basilar  artery are maintained. Luminal irregularity is seen along the bilateral posterior cerebral arteries with multifocal areas of mild stenosis. Venous sinuses: Poorly opacified. Review of the MIP images confirms the above findings IMPRESSION: 1. Multiple hypodense foci along the bilateral ACA and left MCA vascular trees correlating with acute/subacute infarcts described on recent MRI. 2. Remote small lacunar infarct in the bilateral cerebral and left cerebellar hemisphere, as described above. 3. No hemodynamically significant stenosis in the major neck arteries. 4. Prominent luminal irregularity of the intracranial vasculature, may represent advanced atherosclerotic disease versus vasculitis. 5. New occlusion versus high-grade stenosis at the left A2/ACA segment. 6. Multifocal areas of high-grade stenosis the bilateral MCA vascular tree, as described above, with persistent occlusion of the distal left M3/MCA superior division branch and worsening stenosis of right M3/M branches. Electronically Signed   By: Baldemar Lenis M.D.   On: 05/31/2020 18:11   CT HEAD WO CONTRAST  Result Date: 05/30/2020 CLINICAL DATA:  Mental status changes. EXAM: CT HEAD WITHOUT CONTRAST TECHNIQUE: Contiguous axial images were obtained from the base of the skull through the vertex without intravenous contrast. COMPARISON:  04/21/2020 FINDINGS: Brain: Periventricular white matter changes are consistent with small vessel disease. Remote infarcts involving the LEFT frontal lobe, LEFT posterior parietal lobe, LEFT cerebellum. Stable appearance of patchy low-attenuation in the RIGHT corona radiata. There is no intra or extra-axial fluid collection or mass lesion. The basilar cisterns and ventricles have a normal appearance. There is no CT evidence for acute infarction or hemorrhage. Vascular: No hyperdense vessel or unexpected calcification. Skull: Normal. Negative for fracture or focal lesion. Sinuses/Orbits: Moderate mucosal  thickening of the paranasal sinuses. No air-fluid levels. Other: None. IMPRESSION: 1. Stable appearance of chronic LEFT infarcts. 2. Periventricular white matter changes are stable. 3.  No evidence for acute intracranial abnormality. 4. Chronic sinus changes. Electronically Signed   By: Norva Pavlov M.D.   On: 05/30/2020 14:34   MR Brain Wo Contrast (neuro protocol)  Result Date: 05/30/2020 CLINICAL DATA:  Mental status change, unknown cause. EXAM: MRI HEAD WITHOUT CONTRAST TECHNIQUE: Multiplanar, multiecho pulse sequences of the brain and surrounding structures were obtained without intravenous contrast. COMPARISON:  Noncontrast head CT performed earlier today 05/30/2020. CT angiogram head/neck 04/21/2020. Brain MRI 04/20/2020. FINDINGS: Brain: Mild cerebral and cerebellar atrophy. Redemonstrated patchy cortical/subcortical infarcts within the mid left frontal lobe/frontal operculum, now subacute. However, new from the prior brain MRI of 04/20/2020, there are new acute infarcts within the cortical and subcortical bilateral frontal lobes, and within the left callosal body/genu (bilateral MCA and ACA territories). The largest acute infarct is present within the left callosal body/genu and measures  2 cm. Redemonstrated chronic cortical/subcortical infarct within the left parietal lobe. Redemonstrated chronic infarct within the left cerebellar hemisphere. Background moderate severe multifocal T2/FLAIR hyperintensity within the cerebral white matter, nonspecific but compatible with chronic small vessel ischemic disease. This includes chronic lacunar infarcts within the bilateral centrum semiovale and corona radiata. Redemonstrated chronic microhemorrhages within the bilateral parietal lobes. No evidence of intracranial mass. No extra-axial fluid collection. No midline shift. Vascular: Expected proximal arterial flow voids. Skull and upper cervical spine: No focal marrow lesion. Sinuses/Orbits: Visualized orbits  show no acute finding. Mild mucosal thickening within the bilateral frontal sinuses. Moderate bilateral ethmoid sinus mucosal thickening. Mild bilateral maxillary sinus mucosal thickening. IMPRESSION: Redemonstrated patchy cortical/subcortical infarcts within the mid left frontal lobe/frontal operculum, now subacute. New from the prior brain MRI of 04/20/2020, there are small acute infarcts within the cortical and subcortical frontal lobes bilaterally, and within the left callosal body/genu (bilateral MCA and ACA territories). The largest acute infarct is present within the left callosal body/genu, measuring 2 cm. 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 Stable background severe cerebral white matter chronic small vessel ischemic disease. Stable mild generalized parenchymal atrophy. Paranasal sinus disease, as described. Electronically Signed   By: Jackey Loge DO   On: 05/30/2020 16:10       LOS: 2 days   Osvaldo Shipper  [facility] Pager on [email or web address]  06/01/2020, 8:58 AM

## 2020-06-01 NOTE — H&P (View-Only) (Signed)
 [facility] PROGRESS NOTE   Nathaniel Spears MRN:[id number] DOB: 10/09/1975 DOA: 05/30/2020  PCP: Edwards, Michelle P, NP  Brief History/Interval Summary: 45 yo male with PMH recent CVA (just discharged 04/23/20), HTN, substance use (cocaine positive last admission), DMII, obesitywho presented with worsening left lower extremity weakness, dizziness, falling, and ongoing aphasia. 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 Patient was hospitalized for further management.  Seen by neurology.  Plan is for TEE today.  Reason for Visit: Acute stroke  Consultants: Neurology  Procedures: TEE is planned for today  Antibiotics: Anti-infectives (From admission, onward)   None      Subjective/Interval History: Patient noted to be emotional this morning.  States that he does not feel well but unable to specify what it is that is bothering him.  Denies any pain anywhere.  Per nursing aide patient has been a bit more confused.    Assessment/Plan:  Acute stroke Patient with recent hospitalization with acute stroke.  MRI brain done during this admission again shows new infarcts involving the cortical and subcortical frontal lobes.  Some emotional lability noted today which could be due to these infarcts. Patient also underwent CTA.  Vascular irregularities noted.  Will defer to neurology Neurology was consulted.  Plan is for TEE today.  Patient remains on aspirin and Plavix. LDL 39.  Remains on statin.  HbA1c 10.3. PT OT SLP.  Urine drug screen was negative. Echocardiogram done in April showed normal systolic function with grade 1 diastolic dysfunction.  Hypokalemia This will be repleted.  Magnesium 1.8.  Essential hypertension Permissive hypertension allowed.  Monitor blood pressures closely.  Holding antihypertensives.  Diabetes mellitus type 2, uncontrolled  with hyperglycemia HbA1c 10.3.  Continue SSI.  Lantus is currently on hold.  CBGs are reasonably well controlled.  Normocytic anemia No evidence of overt bleeding.  Continue to monitor.  Outpatient evaluation.  Obesity Estimated body mass index is 33.33 kg/m as calculated from the following:   Height as of this encounter: 6' 1" (1.854 m).   Weight as of this encounter: 114.6 kg.   DVT Prophylaxis: Lovenox Code Status: Full code Family Communication: His fiance is not at the bedside today Disposition Plan: Hopefully return home when improved  Status is: Inpatient  Remains inpatient appropriate because:Ongoing diagnostic testing needed not appropriate for outpatient work up and Inpatient level of care appropriate due to severity of illness   Dispo:  Patient From: Home  Planned Disposition: Home  Medically stable for discharge: No         Medications:  Scheduled: .  stroke: mapping our early stages of recovery book   Does not apply Once  . aspirin  325 mg Oral Daily  . atorvastatin  40 mg Oral Daily  . clopidogrel  75 mg Oral Daily  . enoxaparin (LOVENOX) injection  40 mg Subcutaneous Q24H  . insulin aspart  0-20 Units Subcutaneous TID AC & HS  . potassium chloride  40 mEq Oral Q4H   Continuous:  PRN:acetaminophen **OR** acetaminophen (TYLENOL) oral liquid 160 mg/5 mL **OR** acetaminophen, hydrALAZINE, labetalol   Objective:  Vital Signs  Vitals:   05/31/20 1647 05/31/20 2009 06/01/20 0050 06/01/20 0404  BP: (!) 124/99 (!) 154/99 (!) 160/104 (!) 156/94  Pulse: 76 73 74 72  Resp: 13 17 18 20  Temp:  98.7 F (37.1 C) 98 F (36.7 C)   98.1 F (36.7 C)  TempSrc:  Oral Oral Oral  SpO2: 98%  98% 93%  Weight:      Height:        Intake/Output Summary (Last 24 hours) at 06/01/2020 0858 Last data filed at 05/31/2020 2218 Gross per 24 hour  Intake 1863.9 ml  Output --  Net 1863.9 ml   Filed Weights   05/30/20 1349  Weight: 114.6 kg    General appearance:  Awake alert.  In no distress.  Noted to be anxious and crying this morning Resp: Clear to auscultation bilaterally.  Normal effort Cardio: S1-S2 is normal regular.  No S3-S4.  No rubs murmurs or bruit GI: Abdomen is soft.  Nontender nondistended.  Bowel sounds are present normal.  No masses organomegaly Extremities: No edema.  Full range of motion of lower extremities. Neurologic: Expressive aphasia noted.  Able to move all of his extremities without difficulty   Lab Results:  Data Reviewed: I have personally reviewed following labs and imaging studies  CBC: Recent Labs  Lab 05/30/20 1326 05/30/20 1340 05/31/20 0330 06/01/20 0059  WBC 7.5  --  6.9 6.8  NEUTROABS 5.0  --  3.7 3.5  HGB 12.7* 12.6* 11.4* 11.8*  HCT 38.7* 37.0* 35.1* 36.8*  MCV 79.5*  --  80.0 79.8*  PLT 175  --  174 200    Basic Metabolic Panel: Recent Labs  Lab 05/30/20 1326 05/30/20 1340 05/31/20 0330 06/01/20 0059  NA 140 142 140 140  K 3.4* 3.4* 3.0* 3.3*  CL 103 99 107 108  CO2 31  --  24 25  GLUCOSE 221* 215* 120* 117*  BUN 19 17 14 10   CREATININE 1.50* 1.50* 1.17 1.12  CALCIUM 8.4*  --  8.2* 8.5*  MG  --   --  1.5* 1.8    GFR: Estimated Creatinine Clearance: 110.5 mL/min (by C-G formula based on SCr of 1.12 mg/dL).  Liver Function Tests: Recent Labs  Lab 05/30/20 1326  AST 14*  ALT 14  ALKPHOS 69  BILITOT 0.4  PROT 7.3  ALBUMIN 3.7     Recent Labs  Lab 05/30/20 1328  AMMONIA 38*    Coagulation Profile: Recent Labs  Lab 05/30/20 1326  INR 1.1     HbA1C: Recent Labs    05/31/20 0330  HGBA1C 10.3*    CBG: Recent Labs  Lab 05/31/20 0859 05/31/20 1150 05/31/20 1708 05/31/20 2005 06/01/20 0746  GLUCAP 122* 148* 154* 137* 129*    Lipid Profile: Recent Labs    05/31/20 0330  CHOL 74  HDL 24*  LDLCALC 39  TRIG 55  CHOLHDL 3.1      Recent Results (from the past 240 hour(s))  Resp Panel by RT-PCR (Flu A&B, Covid) Nasopharyngeal Swab     Status: None    Collection Time: 05/30/20  1:55 PM   Specimen: Nasopharyngeal Swab; Nasopharyngeal(NP) swabs in vial transport medium  Result Value Ref Range Status   SARS Coronavirus 2 by RT PCR NEGATIVE NEGATIVE Final    Comment: (NOTE) SARS-CoV-2 target nucleic acids are NOT DETECTED.  The SARS-CoV-2 RNA is generally detectable in upper respiratory specimens during the acute phase of infection. The lowest concentration of SARS-CoV-2 viral copies this assay can detect is 138 copies/mL. A negative result does not preclude SARS-Cov-2 infection and should not be used as the sole basis for treatment or other patient management decisions. A negative result may occur with  improper specimen collection/handling, submission of specimen other than nasopharyngeal swab,  presence of viral mutation(s) within the areas targeted by this assay, and inadequate number of viral copies(<138 copies/mL). A negative result must be combined with clinical observations, patient history, and epidemiological information. The expected result is Negative.  Fact Sheet for Patients:  BloggerCourse.com  Fact Sheet for Healthcare Providers:  SeriousBroker.it  This test is no t yet approved or cleared by the [place] FDA and  has been authorized for detection and/or diagnosis of SARS-CoV-2 by FDA under an Emergency Use Authorization (EUA). This EUA will remain  in effect (meaning this test can be used) for the duration of the COVID-19 declaration under Section 564(b)(1) of the Act, 21 U.S.C.section 360bbb-3(b)(1), unless the authorization is terminated  or revoked sooner.       Influenza A by PCR NEGATIVE NEGATIVE Final   Influenza B by PCR NEGATIVE NEGATIVE Final    Comment: (NOTE) The Xpert Xpress SARS-CoV-2/FLU/RSV plus assay is intended as an aid in the diagnosis of influenza from Nasopharyngeal swab specimens and should not be used as a sole basis for treatment.  Nasal washings and aspirates are unacceptable for Xpert Xpress SARS-CoV-2/FLU/RSV testing.  Fact Sheet for Patients: BloggerCourse.com  Fact Sheet for Healthcare Providers: SeriousBroker.it  This test is not yet approved or cleared by the [place] FDA and has been authorized for detection and/or diagnosis of SARS-CoV-2 by FDA under an Emergency Use Authorization (EUA). This EUA will remain in effect (meaning this test can be used) for the duration of the COVID-19 declaration under Section 564(b)(1) of the Act, 21 U.S.C. section 360bbb-3(b)(1), unless the authorization is terminated or revoked.  Performed at [facility], [address], [place], [place] [address]       Radiology Studies: CT ANGIO HEAD NECK W WO CM  Result Date: 05/31/2020 CLINICAL DATA:  Focal neurological deficit CT, more than 6 hours, stroke suspected. EXAM: CT ANGIOGRAPHY HEAD AND NECK TECHNIQUE: Multidetector CT imaging of the head and neck was performed using the standard protocol during bolus administration of intravenous contrast. Multiplanar CT image reconstructions and MIPs were obtained to evaluate the vascular anatomy. Carotid stenosis measurements (when applicable) are obtained utilizing NASCET criteria, using the distal internal carotid diameter as the denominator. CONTRAST:  11mL OMNIPAQUE IOHEXOL 350 MG/ML SOLN COMPARISON:  MRI of the brain May 30, 2020 FINDINGS: CT HEAD FINDINGS Brain: Multiple hypodense foci are seen along the bilateral ACA and left MCA territory, corresponding to infarct seen on recent MRI of the brain. Multiple additional hypodense foci are seen in the bilateral cerebral hemispheres, corresponding to chronic infarcts. Area as of encephalomalacia and gliosis in the left parietooccipital region and inferior left cerebellar hemisphere. No hemorrhage, hydrocephalus, extra-axial collection or mass lesion. Vascular: No  hyperdense vessel. Skull: Normal. Negative for fracture or focal lesion. Sinuses: Mucosal thickening throughout the paranasal sinuses. Orbits: No acute finding. 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 Image portion shows no evidence of aneurysm or dissection. No significant stenosis at the origin of the major arch vessels. Right carotid system: No evidence of dissection, stenosis or occlusion. Left carotid system: No evidence of dissection, stenosis or occlusion. Vertebral arteries: Left dominant. No evidence of dissection, stenosis (50% or greater) or occlusion. Skeleton: Degenerative changes of the cervical spine at C5-6 and C6-7. No acute or aggressive process identified. Other neck: Mildly prominent  bilateral cervical lymph nodes, nonspecific. May be reactive. Prominent adenoid tissue. Upper chest: Negative. Review of the MIP images confirms the above findings CTA HEAD FINDINGS Anterior circulation: Intracranial bilateral internal carotid arteries abnormal course and caliber. Bilateral A1 segments are patent noting a hypoplastic right A1/ACA. Interval development of a new focal occlusion versus high-grade stenosis at the mid A2 segment of the left anterior cerebral artery with normal distal opacification. Luminal irregularity is noted along the bilateral ACA vascular trees. Interval worsening of bilateral multifocal areas of stenosis of the bilateral MCA vascular tree preserving the bilateral M1 segments with severe stenosis at the proximal left M2 segment and persistent occlusion at the distal left M3/MCA superior division branch. Severe stenosis at the proximal right M2/MCA superior division branch and M3/MCA posterior division branches. Posterior circulation: The intracranial bilateral vertebral arteries and basilar  artery are maintained. Luminal irregularity is seen along the bilateral posterior cerebral arteries with multifocal areas of mild stenosis. Venous sinuses: Poorly opacified. Review of the MIP images confirms the above findings IMPRESSION: 1. Multiple hypodense foci along the bilateral ACA and left MCA vascular trees correlating with acute/subacute infarcts described on recent MRI. 2. Remote small lacunar infarct in the bilateral cerebral and left cerebellar hemisphere, as described above. 3. No hemodynamically significant stenosis in the major neck arteries. 4. Prominent luminal irregularity of the intracranial vasculature, may represent advanced atherosclerotic disease versus vasculitis. 5. New occlusion versus high-grade stenosis at the left A2/ACA segment. 6. Multifocal areas of high-grade stenosis the bilateral MCA vascular tree, as described above, with persistent occlusion of the distal left M3/MCA superior division branch and worsening stenosis of right M3/M branches. Electronically Signed   By: Baldemar Lenis M.D.   On: 05/31/2020 18:11   CT HEAD WO CONTRAST  Result Date: 05/30/2020 CLINICAL DATA:  Mental status changes. EXAM: CT HEAD WITHOUT CONTRAST TECHNIQUE: Contiguous axial images were obtained from the base of the skull through the vertex without intravenous contrast. COMPARISON:  04/21/2020 FINDINGS: Brain: Periventricular white matter changes are consistent with small vessel disease. Remote infarcts involving the LEFT frontal lobe, LEFT posterior parietal lobe, LEFT cerebellum. Stable appearance of patchy low-attenuation in the RIGHT corona radiata. There is no intra or extra-axial fluid collection or mass lesion. The basilar cisterns and ventricles have a normal appearance. There is no CT evidence for acute infarction or hemorrhage. Vascular: No hyperdense vessel or unexpected calcification. Skull: Normal. Negative for fracture or focal lesion. Sinuses/Orbits: Moderate mucosal  thickening of the paranasal sinuses. No air-fluid levels. Other: None. IMPRESSION: 1. Stable appearance of chronic LEFT infarcts. 2. Periventricular white matter changes are stable. 3.  No evidence for acute intracranial abnormality. 4. Chronic sinus changes. Electronically Signed   By: Norva Pavlov M.D.   On: 05/30/2020 14:34   MR Brain Wo Contrast (neuro protocol)  Result Date: 05/30/2020 CLINICAL DATA:  Mental status change, unknown cause. EXAM: MRI HEAD WITHOUT CONTRAST TECHNIQUE: Multiplanar, multiecho pulse sequences of the brain and surrounding structures were obtained without intravenous contrast. COMPARISON:  Noncontrast head CT performed earlier today 05/30/2020. CT angiogram head/neck 04/21/2020. Brain MRI 04/20/2020. FINDINGS: Brain: Mild cerebral and cerebellar atrophy. Redemonstrated patchy cortical/subcortical infarcts within the mid left frontal lobe/frontal operculum, now subacute. However, new from the prior brain MRI of 04/20/2020, there are new acute infarcts within the cortical and subcortical bilateral frontal lobes, and within the left callosal body/genu (bilateral MCA and ACA territories). The largest acute infarct is present within the left callosal body/genu and measures  2 cm. Redemonstrated chronic cortical/subcortical infarct within the left parietal lobe. Redemonstrated chronic infarct within the left cerebellar hemisphere. Background moderate severe multifocal T2/FLAIR hyperintensity within the cerebral white matter, nonspecific but compatible with chronic small vessel ischemic disease. This includes chronic lacunar infarcts within the bilateral centrum semiovale and corona radiata. Redemonstrated chronic microhemorrhages within the bilateral parietal lobes. No evidence of intracranial mass. No extra-axial fluid collection. No midline shift. Vascular: Expected proximal arterial flow voids. Skull and upper cervical spine: No focal marrow lesion. Sinuses/Orbits: Visualized orbits  show no acute finding. Mild mucosal thickening within the bilateral frontal sinuses. Moderate bilateral ethmoid sinus mucosal thickening. Mild bilateral maxillary sinus mucosal thickening. IMPRESSION: Redemonstrated patchy cortical/subcortical infarcts within the mid left frontal lobe/frontal operculum, now subacute. New from the prior brain MRI of 04/20/2020, there are small acute infarcts within the cortical and subcortical frontal lobes bilaterally, and within the left callosal body/genu (bilateral MCA and ACA territories). The largest acute infarct is present within the left callosal body/genu, measuring 2 cm. 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 Stable background severe cerebral white matter chronic small vessel ischemic disease. Stable mild generalized parenchymal atrophy. Paranasal sinus disease, as described. Electronically Signed   By: Jackey Loge DO   On: 05/30/2020 16:10       LOS: 2 days   Osvaldo Shipper  [facility] Pager on [email or web address]  06/01/2020, 8:58 AM

## 2020-06-01 NOTE — Progress Notes (Signed)
  Echocardiogram Echocardiogram Transesophageal has been performed.  Nathaniel Spears 06/01/2020, 1:58 PM

## 2020-06-01 NOTE — Anesthesia Procedure Notes (Signed)
Procedure Name: MAC Date/Time: 06/01/2020 12:56 PM Performed by: Kathryne Hitch, CRNA Pre-anesthesia Checklist: Patient identified, Emergency Drugs available, Suction available and Patient being monitored Patient Re-evaluated:Patient Re-evaluated prior to induction Oxygen Delivery Method: Nasal cannula Preoxygenation: Pre-oxygenation with 100% oxygen Induction Type: IV induction Placement Confirmation: positive ETCO2 Dental Injury: Teeth and Oropharynx as per pre-operative assessment

## 2020-06-01 NOTE — Progress Notes (Signed)
STROKE TEAM PROGRESS NOTE   INTERVAL HISTORY No family at bedside.  Patient lying in bed, TEE done today showed no source of emboli.  Discussed with cerebral angiogram, patient willing to proceed.  Vitals:   06/01/20 1343 06/01/20 1345 06/01/20 1354 06/01/20 1419  BP:   (!) 133/95 (!) 149/106  Pulse: 96 95 90 84  Resp: (!) 21 (!) 25 (!) 23 15  Temp:    98.7 F (37.1 C)  TempSrc:    Oral  SpO2: 99% 98% 100% 100%  Weight:      Height:       CBC:  Recent Labs  Lab 05/31/20 0330 06/01/20 0059  WBC 6.9 6.8  NEUTROABS 3.7 3.5  HGB 11.4* 11.8*  HCT 35.1* 36.8*  MCV 80.0 79.8*  PLT 174 200   Basic Metabolic Panel:  Recent Labs  Lab 05/31/20 0330 06/01/20 0059  NA 140 140  K 3.0* 3.3*  CL 107 108  CO2 24 25  GLUCOSE 120* 117*  BUN 14 10  CREATININE 1.17 1.12  CALCIUM 8.2* 8.5*  MG 1.5* 1.8   Lipid Panel:  Recent Labs  Lab 05/31/20 0330  CHOL 74  TRIG 55  HDL 24*  CHOLHDL 3.1  VLDL 11  LDLCALC 39   HgbA1c:  Recent Labs  Lab 05/31/20 0330  HGBA1C 10.3*   Urine Drug Screen:  Recent Labs  Lab 05/30/20 1644  LABOPIA NONE DETECTED  COCAINSCRNUR NONE DETECTED  LABBENZ NONE DETECTED  AMPHETMU NONE DETECTED  THCU NONE DETECTED  LABBARB NONE DETECTED    Alcohol Level  Recent Labs  Lab 05/30/20 1326  ETH <10    IMAGING past 24 hours No results found.   PHYSICAL EXAM Constitutional: Appears well-developed and well-nourished.  Psych: Affect appropriate to situation Eyes: No scleral injection HENT: No OP obstruction MSK: no joint deformities.  Cardiovascular: Normal rate and regular rhythm.  Respiratory: Effort normal, non-labored breathing GI: Soft.  No distension. There is no tenderness.  Skin: WDI Neuro: awake, alert,flat affect,eyes open, orientated to place andpeople, but not to age or month. No aphasiabut paucity of speech, following all simple commands. Able to name and repeat.Slight dysarthria.No gaze palsy, tracking bilaterally,  visual field full on confrontation, PERRL. No facial droop. Tongue midline. Bilateral UEsproximal 4/5, and distal left 3/5 and right 4/5, no drift. Bilaterally LEsproximal 4+/5,and distal left 3-/5 and right 4+/5, nodrift. Sensation symmetrical bilaterally, b/l FTN intact, gait not tested.   ASSESSMENT/PLAN Nathaniel Spears is a 45 yo male with PMH recent left MCA branch CVA a month ago in the setting of multiple uncontrolled stroke risk factors and multifocal ntracranial artherosclerosis. His PMH includes  HTN, substance use (cocaine positive last admission), DMII, and  Obesity. He presented with worsening left lower extremity weakness, dizziness, falling, and ongoing aphasia. Found to have new stroke in bilateral MCA and ACA territories.   Stroke - recurrent stroke with new acute infarcts within bilateral MCA and ACA in the setting of multifocal intracranial stenosis, cocaine abuse, uncontrolled hypertension and DM2, along with heavy tobacco abuse.   HCT: No evidence for acute intracranial abnormality. Stable chronic left infarcts.   MRI: small acute infarcts within the cortical and subcortical frontal lobes bilaterally, and within the left callosal body/genu (bilateral MCA and ACA territories). The largest acute infarct is present within the left callosal body/genu, measuring 2 cm.  CTA: No LVO. Severe atheromatous disease involving the distal MCA branches bilaterally with associated moderate to severe multifocal bilateral M2 and M3  stenoses as above. The severely diseased MCA branches remain perfused and grossly symmetric at this time.  TEE no source of emboli, no PFO  Cerebral angio pending  Patient not a LP candidate at this moment given on Plavix.  May consider outpatient LP to rule out CNS vasculitis but felt less likely at this time.   A1c 10.3  LDL 39  Was on ASA 325mg  and Plavix 75mg  x 3 months prior to admission, may consider ASA and brilinta combination   History of  stroke  Patient stroke in 04/2020 with aphasia, intermittent headache and dizziness.  CT showed old left cerebellum and left parieto-occipital infarct.  MRI showed left MCA patchy infarcts.  MRA and CTA head and neck showed bilateral MCA branch high-grade stenosis.  EF 60 to 65%.  LDL 53, A1c 11.1, UDS positive for cocaine.  Patient discharged with DAPT and Lipitor 40.  Multifocal intracranial stenosis  MRA April 2022 and now with bilateral narrowing of the middle cerebral arteries  Severe atheromatous disease involving the distal MCA branches bilaterally with associated moderate to severe multifocal bilateral M2 and M3 stenoses. The severely diseased MCA branches remain perfused and grossly symmetric at the time.  The April 2022 left MCA infarct likely related to left MCA branch stenosis  On DAPT prior to admission  Hypertension  Stable  Avoid low BP  Long-term BP goal 130-150 given severe intracranial stenosis  Hyperlipidemia  Home meds: Lipitor 40mg    LDL 53, at goal < 70   lipitor 40 resumed  Continue statin at discharge  Diabetes type II Uncontrolled  New diagnosis last month   HgbA1c 10.3, goal < 7.0  Received DM education and support from DM coordinator last month  SSI  CBGs  Management per primary team.   Tobacco abuse  Has quit since last admission  Cocaine abuse   UDS positive for cocaine last month   He reports he quit   Other Stroke Risk Factors  ETOH use, alcohol level <10, advised to drink no more than 1 drink(s) a day  Obesity, Body mass index is 32.95 kg/m., BMI >/= 30 associated with increased stroke risk, recommend weight loss, diet and exercise as appropriate   Other Active Problems    Hospital day # 17 May 2020, MD PhD Stroke Neurology 06/01/2020 5:56 PM     To contact Stroke Continuity provider, please refer to . After hours, contact General Neurology

## 2020-06-01 NOTE — Anesthesia Preprocedure Evaluation (Addendum)
Anesthesia Evaluation  Patient identified by MRN, date of birth, ID band Patient awake    Reviewed: Allergy & Precautions, NPO status , [name] Chart, lab work & pertinent test results  Airway Mallampati: III  TM Distance: >3 FB Neck ROM: Full    Dental  (+) Poor Dentition   Pulmonary Current Smoker,    Pulmonary exam normal breath sounds clear to auscultation       Cardiovascular hypertension, Pt. on medications  Rhythm:Regular Rate:Tachycardia     Neuro/Psych CVA (on ASA/Plavix)    GI/Hepatic negative GI ROS, (+)     substance abuse  ,   Endo/Other  diabetes, Type 2, Oral Hypoglycemic Agents, Insulin Dependent  Renal/GU   negative genitourinary   Musculoskeletal negative musculoskeletal ROS (+)   Abdominal (+) + obese,   Peds  Hematology  (+) anemia ,   Anesthesia Other Findings   Reproductive/Obstetrics negative OB ROS                            Anesthesia Physical Anesthesia Plan  ASA: III  Anesthesia Plan: MAC   Post-op Pain Management:    Induction: Intravenous  PONV Risk Score and Plan: Propofol infusion, TIVA and Treatment may vary due to age or medical condition  Airway Management Planned: Natural Airway and Nasal Cannula  Additional Equipment:   Intra-op Plan:   Post-operative Plan:   Informed Consent: I have reviewed the patients History and Physical, chart, labs and discussed the procedure including the risks, benefits and alternatives for the proposed anesthesia with the patient or authorized representative who has indicated his/her understanding and acceptance.     Dental advisory given  Plan Discussed with: CRNA and Anesthesiologist  Anesthesia Plan Comments:        Anesthesia Quick Evaluation

## 2020-06-01 NOTE — Transfer of Care (Signed)
Immediate Anesthesia Transfer of Care Note  Patient: Regan Medford  Procedure(s) Performed: TRANSESOPHAGEAL ECHOCARDIOGRAM (TEE) (N/A ) BUBBLE STUDY  Patient Location: Endoscopy Unit  Anesthesia Type:MAC  Level of Consciousness: drowsy and patient cooperative  Airway & Oxygen Therapy: Patient Spontanous Breathing and Patient connected to nasal cannula oxygen  Post-op Assessment: Report given to RN and Post -op Vital signs reviewed and stable  Post vital signs: Reviewed and stable  Last Vitals:  Vitals Value Taken Time  BP 123/72 06/01/20 1332  Temp    Pulse 99 06/01/20 1332  Resp 29 06/01/20 1333  SpO2 100 % 06/01/20 1332  Vitals shown include unvalidated device data.  Last Pain:  Vitals:   06/01/20 1220  TempSrc: Temporal  PainSc: 0-No pain         Complications: No complications documented.

## 2020-06-01 NOTE — Progress Notes (Signed)
PT Cancellation Note  Patient Details Name: Nathaniel Spears MRN: [id number] DOB: 04/03/1975   Cancelled Treatment:    Reason Eval/Treat Not Completed: Patient at procedure or test/unavailable OTF at procedure. Will attempt to return later if time/schedule allow.    Madelaine Etienne, DPT, PN1   Supplemental Physical Therapist [facility]    Pager [phone] Acute Rehab Office [phone]

## 2020-06-02 ENCOUNTER — Inpatient Hospital Stay (HOSPITAL_COMMUNITY): Payer: Medicaid Other

## 2020-06-02 DIAGNOSIS — Z87891 Personal history of nicotine dependence: Secondary | ICD-10-CM

## 2020-06-02 DIAGNOSIS — F1411 Cocaine abuse, in remission: Secondary | ICD-10-CM

## 2020-06-02 DIAGNOSIS — E7800 Pure hypercholesterolemia, unspecified: Secondary | ICD-10-CM

## 2020-06-02 HISTORY — PX: IR ANGIO INTRA EXTRACRAN SEL COM CAROTID INNOMINATE BILAT MOD SED: IMG5360

## 2020-06-02 HISTORY — PX: IR ANGIO VERTEBRAL SEL SUBCLAVIAN INNOMINATE BILAT MOD SED: IMG5366

## 2020-06-02 LAB — BASIC METABOLIC PANEL
Anion gap: 7 (ref 5–15)
BUN: 7 mg/dL (ref 6–20)
CO2: 27 mmol/L (ref 22–32)
Calcium: 8.7 mg/dL — ABNORMAL LOW (ref 8.9–10.3)
Chloride: 105 mmol/L (ref 98–111)
Creatinine, Ser: 1.24 mg/dL (ref 0.61–1.24)
GFR, Estimated: >60 mL/min (ref >60)
Glucose, Bld: 99 mg/dL (ref 70–99)
Potassium: 3.5 mmol/L (ref 3.5–5.1)
Sodium: 139 mmol/L (ref 135–145)

## 2020-06-02 LAB — CBC WITH DIFFERENTIAL/PLATELET
Abs Immature Granulocytes: 0.02 10*3/uL (ref 0.00–0.07)
Basophils Absolute: 0 10*3/uL (ref 0.0–0.1)
Basophils Relative: 0 %
Eosinophils Absolute: 0.1 10*3/uL (ref 0.0–0.5)
Eosinophils Relative: 1 %
HCT: 37.6 % — ABNORMAL LOW (ref 39.0–52.0)
Hemoglobin: 12.2 g/dL — ABNORMAL LOW (ref 13.0–17.0)
Immature Granulocytes: 0 %
Lymphocytes Relative: 31 %
Lymphs Abs: 2.3 10*3/uL (ref 0.7–4.0)
MCH: 25.6 pg — ABNORMAL LOW (ref 26.0–34.0)
MCHC: 32.4 g/dL (ref 30.0–36.0)
MCV: 79 fL — ABNORMAL LOW (ref 80.0–100.0)
Monocytes Absolute: 0.6 10*3/uL (ref 0.1–1.0)
Monocytes Relative: 8 %
Neutro Abs: 4.3 10*3/uL (ref 1.7–7.7)
Neutrophils Relative %: 60 %
Platelets: 222 10*3/uL (ref 150–400)
RBC: 4.76 MIL/uL (ref 4.22–5.81)
RDW: 12.2 % (ref 11.5–15.5)
WBC: 7.4 10*3/uL (ref 4.0–10.5)
nRBC: 0 % (ref 0.0–0.2)

## 2020-06-02 LAB — GLUCOSE, CAPILLARY
Glucose-Capillary: 121 mg/dL — ABNORMAL HIGH (ref 70–99)
Glucose-Capillary: 128 mg/dL — ABNORMAL HIGH (ref 70–99)
Glucose-Capillary: 89 mg/dL (ref 70–99)
Glucose-Capillary: 97 mg/dL (ref 70–99)

## 2020-06-02 LAB — MAGNESIUM: Magnesium: 1.6 mg/dL — ABNORMAL LOW (ref 1.7–2.4)

## 2020-06-02 IMAGING — XA IR ANGIO INTRA EXTRACRAN SEL COM CAROTID INNOMINATE BILAT MOD SE
1 series · 12 of 24 positions shown · IV contrast (IODINE)
Comparison: MRI MRA of the brain [DATE], and CT angiogram of
the head and neck [DATE].

CLINICAL DATA: Recurred ischemic strokes involving the anterior
circulation.

Abnormal CT angiogram of the head and neck.
EXAM:
BILATERAL COMMON CAROTID AND INNOMINATE ANGIOGRAPHY
TECHNIQUE: Informed written consent was obtained from the patient after a
thorough discussion of the procedural risks, benefits and
alternatives. All questions were addressed. Maximal Sterile Barrier
Technique was utilized including caps, mask, sterile gowns, sterile
gloves, sterile drape, hand hygiene and skin antiseptic. A timeout
was performed prior to the initiation of the procedure.

[Series 300: ir angio external carotid sel ext caroti · 12 of 215 slices shown]
[im 10/215]
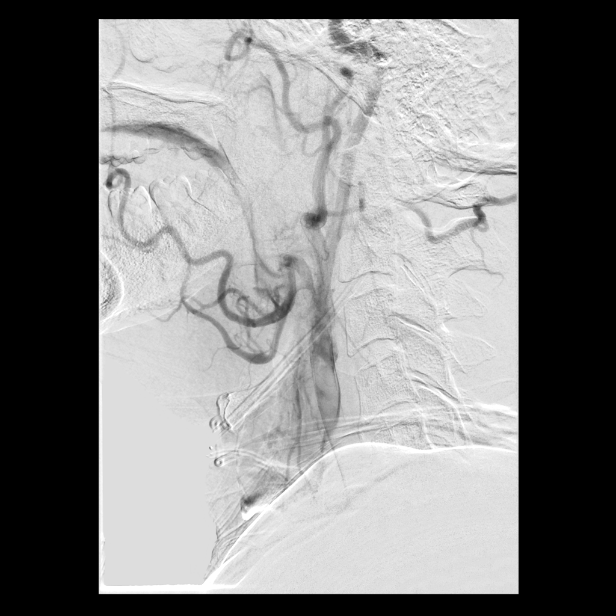
[im 28/215]
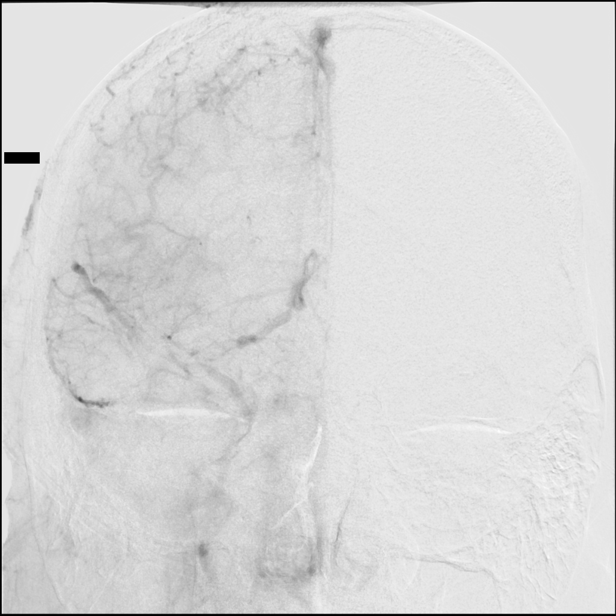
[im 47/215]
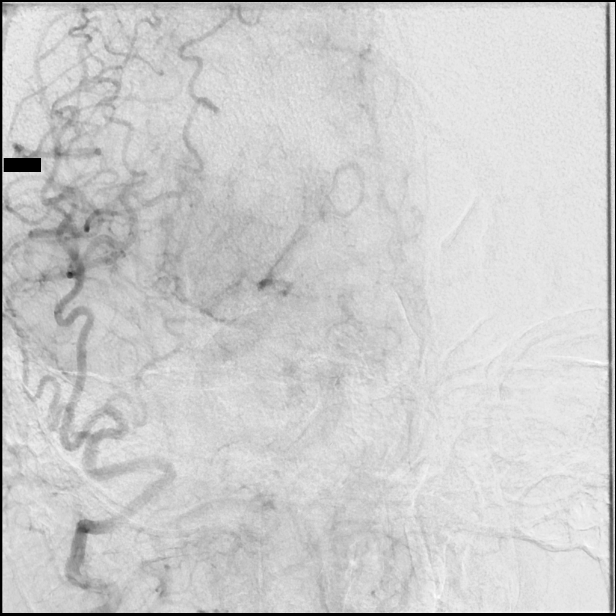
[im 66/215]
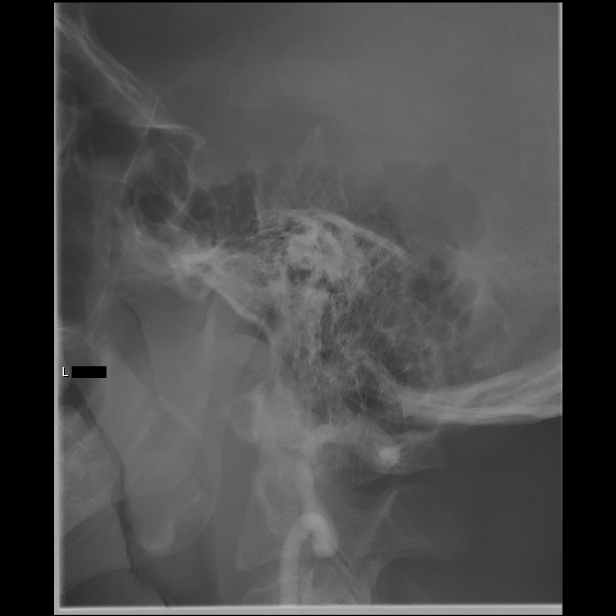
[im 84/215]
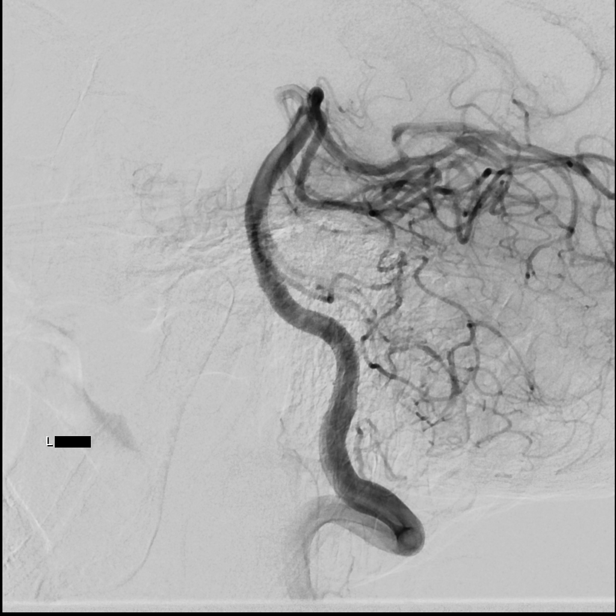
[im 103/215]
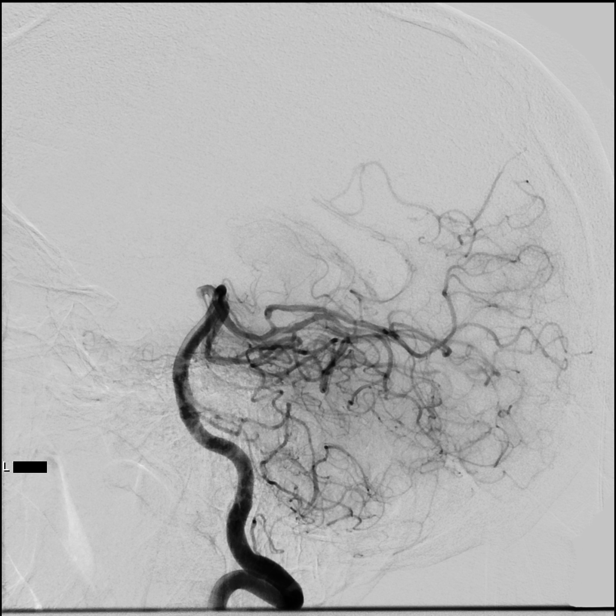
[im 121/215]
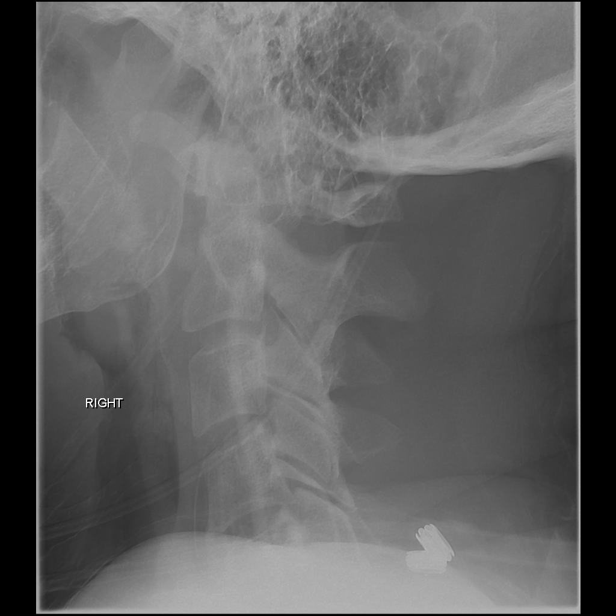
[im 140/215]
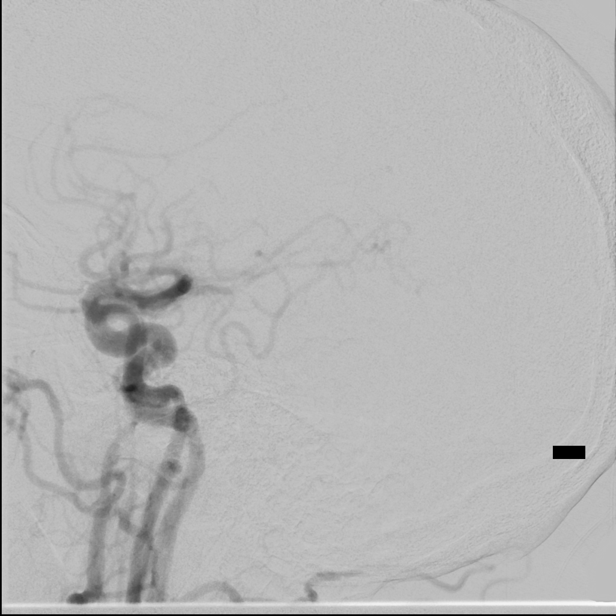
[im 159/215]
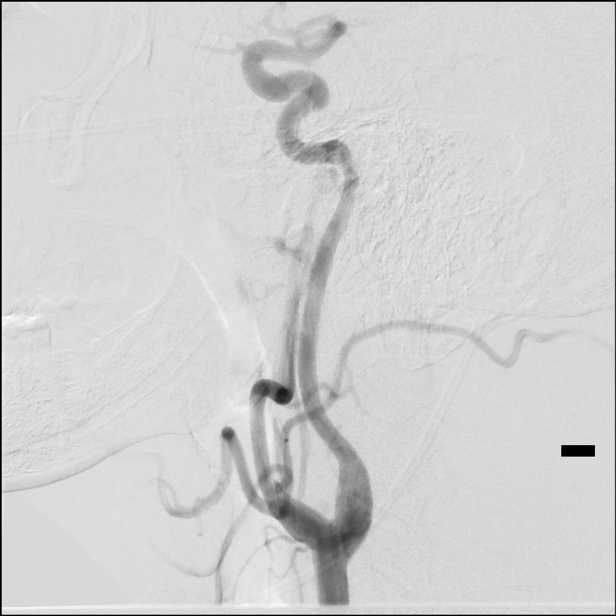
[im 177/215]
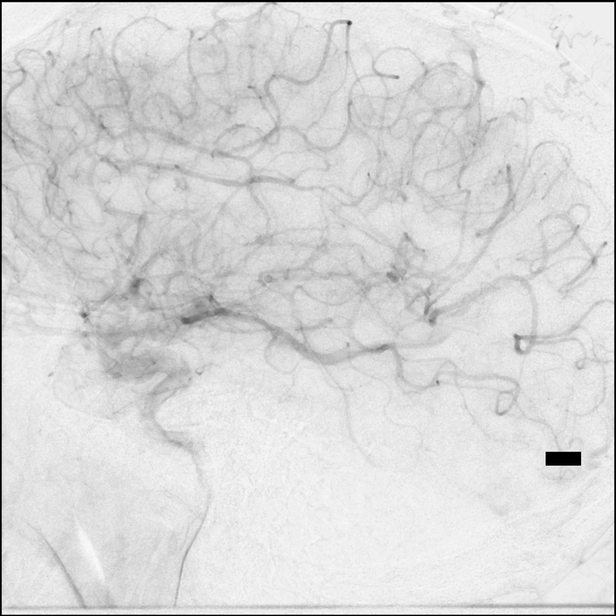
[im 196/215]
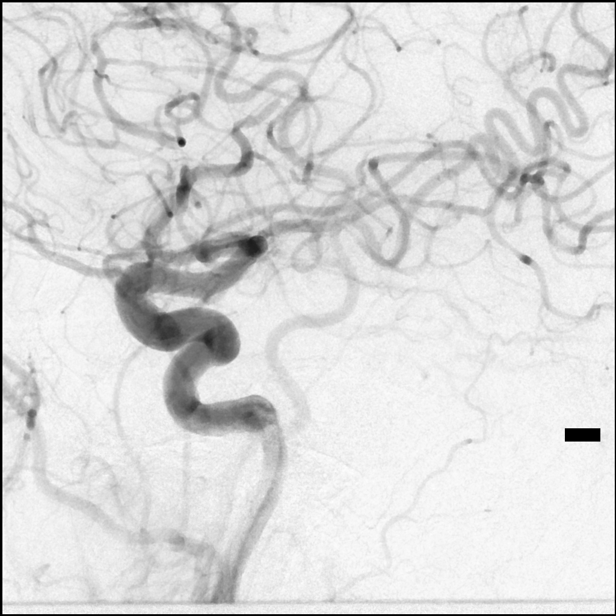
[im 215/215]
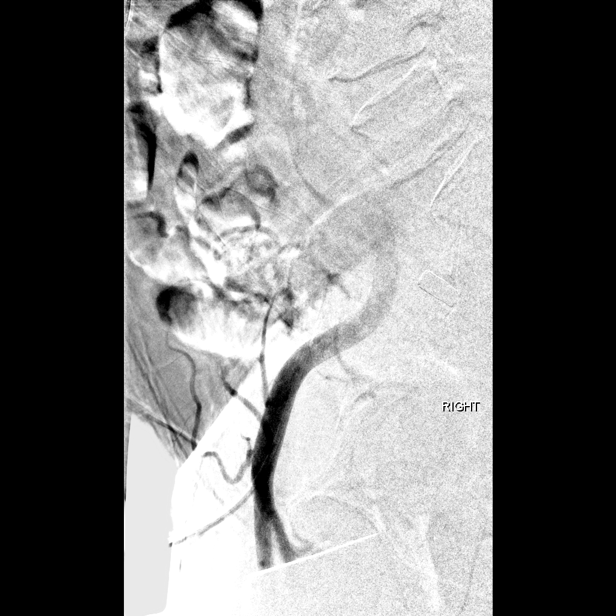

[12 of 24 positions shown; findings below may reference images not displayed]

MEDICATIONS:
Heparin [F1] units IV. None antibiotic was administered within 1
hour of the procedure.

ANESTHESIA/SEDATION:
Versed 1 mg IV; Fentanyl 25 mcg IV

Moderate Sedation Time:  46 minutes

The patient was continuously monitored during the procedure by the
interventional radiology nurse under my direct supervision.

CONTRAST:  Isovue 300 approximately 65 mL.

FLUOROSCOPY TIME:  Fluoroscopy Time: 20 minutes 6 seconds ([F1]
mGy).

COMPLICATIONS:
None immediate.
The right groin was prepped and draped in the usual sterile fashion.
Thereafter using modified Seldinger technique, transfemoral access
into the right common femoral artery was obtained without
difficulty. Over a 0.035 inch guidewire, a 5 French Pinnacle sheath
was inserted. Through this, and also over 0.035 inch guidewire, a 5
French JB 1 catheter was advanced to the aortic arch region and
selectively positioned in the right common carotid artery, right
subclavian artery, the left common carotid artery and the left
vertebral artery.
FINDINGS: The right common carotid arteriogram demonstrates the right external
carotid artery and its major branches to be widely patent.

The right internal carotid artery at the bulb to the cranial skull
base opacifies widely.

The petrous, cavernous and supraclinoid segments are widely patent.

The right middle cerebral artery M1 segment is widely patent. There
is a mild to moderate stenosis at the distal right M1 segment.

The right middle cerebral artery superior division demonstrates
significant proximal stenosis, as does the parietal branch from the
inferior division.

Distally the vessel is seen to opacify into the capillary and venous
phases.

The right anterior cerebral artery has high-grade tandem stenosis of
the distal A1 segment, and the right A1 A2 junction.

The left vertebral artery origin is widely patent.

The vessel opacify to the cranial skull base. Patency is seen of the
left vertebrobasilar junction and the left posterior-inferior
cerebellar artery.

The left anterior-inferior cerebellar artery/left posterior-inferior
cerebellar artery complex is seen.

The basilar artery, the posterior cerebral arteries, the superior
cerebellar arteries and the anterior-inferior cerebellar arteries
opacify into the capillary and venous phases. Unopacified blood is
seen in the basilar artery from the contralateral vertebral artery.

There is approximately 60% stenosis of the left P1 distally.

The left common carotid arteriogram demonstrates the left external
carotid artery and its major branches to be widely patent.

The left internal carotid artery at the bulb to the cranial skull
base is widely patent.

The petrous, the cavernous and the supraclinoid segments are widely
patent.

The left middle cerebral artery demonstrates a mild tapered
narrowing of the distal M1 segment.

The inferior division demonstrates a moderate stenosis proximally.

The superior division demonstrates occlusion in the distal M2
segment.

The left anterior cerebral artery A2 segment demonstrates
significant segmental stenosis of the proximal A2 segment.

More distally focal areas of caliber irregularity with narrowing are
seen involving the callosal marginal, and the pericallosal branches.

The right subclavian artery is the last branch arising from the
aortic arch. This projects medially centrally and then towards the
right subclavian region, consistent with aberrant origin of the
right subclavian artery, a developmental variation.

The right vertebral artery origin from the aberrant right subclavian
artery appears widely patent proximally to the cranial skull base.

Patency is seen to the right vertebrobasilar junction with patency
seen of the right posterior-inferior cerebellar artery.
IMPRESSION: Moderate stenosis of the right middle cerebral artery distal M1
segment.

Severe segmental stenosis of the superior division of the right
middle cerebral artery proximally, and of a parietal branch of the
inferior division of the right middle cerebral artery.

Severe tandem segmental stenosis of the right anterior cerebral
artery of the distal A1 segment and at the A1 A2 junction.

Moderate stenosis of the inferior division of the left middle
cerebral artery proximally.

The superior division demonstrates occlusion in the distal M2
region.

Left anterior cerebral artery significant segmental stenosis of the
proximal A2 segment.

Focal areas of segmental stenosis of the proximal left pericallosal,
and the callosal marginal arteries.

Approximately 60% stenosis of the left posterior cerebral artery
distal P1 segment.

PLAN:
Findings discussed with the patient, and with the referring ERNESTINAS.

## 2020-06-02 MED ORDER — VERAPAMIL HCL 2.5 MG/ML IV SOLN
INTRAVENOUS | Status: AC
  Filled 2020-06-02: qty 2

## 2020-06-02 MED ORDER — POTASSIUM CHLORIDE CRYS ER 20 MEQ PO TBCR
40.0000 meq | EXTENDED_RELEASE_TABLET | Freq: Two times a day (BID) | ORAL | Status: AC
  Administered 2020-06-02 (×2): 40 meq via ORAL
  Filled 2020-06-02 (×2): qty 2

## 2020-06-02 MED ORDER — IOHEXOL 300 MG/ML  SOLN
100.0000 mL | Freq: Once | INTRAMUSCULAR | Status: AC | PRN
  Administered 2020-06-02: 65 mL via INTRA_ARTERIAL

## 2020-06-02 MED ORDER — MIDAZOLAM HCL 2 MG/2ML IJ SOLN
INTRAMUSCULAR | Status: AC | PRN
  Administered 2020-06-02: 1 mg via INTRAVENOUS

## 2020-06-02 MED ORDER — IOHEXOL 300 MG/ML  SOLN
50.0000 mL | Freq: Once | INTRAMUSCULAR | Status: AC | PRN
  Administered 2020-06-02: 15 mL via INTRA_ARTERIAL

## 2020-06-02 MED ORDER — NITROGLYCERIN 1 MG/10 ML FOR IR/CATH LAB
INTRA_ARTERIAL | Status: AC
  Filled 2020-06-02: qty 10

## 2020-06-02 MED ORDER — LIDOCAINE HCL 1 % IJ SOLN
INTRAMUSCULAR | Status: AC
  Filled 2020-06-02: qty 20

## 2020-06-02 MED ORDER — HYDRALAZINE HCL 20 MG/ML IJ SOLN
INTRAMUSCULAR | Status: AC
  Filled 2020-06-02: qty 1

## 2020-06-02 MED ORDER — SODIUM CHLORIDE 0.9 % IV SOLN: INTRAVENOUS | Status: AC

## 2020-06-02 MED ORDER — HEPARIN SODIUM (PORCINE) 1000 UNIT/ML IJ SOLN
INTRAMUSCULAR | Status: AC
  Filled 2020-06-02: qty 1

## 2020-06-02 MED ORDER — HYDRALAZINE HCL 20 MG/ML IJ SOLN
INTRAMUSCULAR | Status: AC | PRN
  Administered 2020-06-02 (×2): 5 mg via INTRAVENOUS

## 2020-06-02 MED ORDER — HEPARIN SODIUM (PORCINE) 1000 UNIT/ML IJ SOLN
INTRAMUSCULAR | Status: AC | PRN
  Administered 2020-06-02: 1000 [IU] via INTRAVENOUS

## 2020-06-02 MED ORDER — FENTANYL CITRATE (PF) 100 MCG/2ML IJ SOLN
INTRAMUSCULAR | Status: AC
  Filled 2020-06-02: qty 4

## 2020-06-02 MED ORDER — MAGNESIUM SULFATE 2 GM/50ML IV SOLN
2.0000 g | Freq: Once | INTRAVENOUS | Status: AC
  Administered 2020-06-02: 2 g via INTRAVENOUS
  Filled 2020-06-02: qty 50

## 2020-06-02 MED ORDER — FENTANYL CITRATE (PF) 100 MCG/2ML IJ SOLN
INTRAMUSCULAR | Status: AC | PRN
  Administered 2020-06-02: 25 ug via INTRAVENOUS

## 2020-06-02 MED ORDER — MIDAZOLAM HCL 2 MG/2ML IJ SOLN
INTRAMUSCULAR | Status: AC
  Filled 2020-06-02: qty 4

## 2020-06-02 MED ORDER — LIDOCAINE HCL (PF) 1 % IJ SOLN
INTRAMUSCULAR | Status: AC | PRN
  Administered 2020-06-02: 30 mL

## 2020-06-02 NOTE — Sedation Documentation (Signed)
Right femoral sheath removed and 65F Exoseal deployed.

## 2020-06-02 NOTE — Consult Note (Signed)
Chief Complaint: Patient was seen in consultation today for CVA/diagnostic cerebral arteriogram.  Referring Physician(s): Rosalin Hawking (neurology)  Supervising Physician: Luanne Bras  Patient Status: [name] - In-pt  History of Present Illness: Nathaniel Spears is a 45 y.o. male with a past medical history of hypertension, CVA (04/2020), diabetes mellitus, obesity, and substance abuse (cocaine positive last admission but negative this admission, heavy tobacco use). Of note, patient was recently admitted to [facility] 04/20/2020 to 04/23/2020 for management of acute CVA (acute infarct of left middle frontal lobe). He was discharged home in stable condition on DAPT (Plavix/Aspirin) and rehabilitation therapy (OT/SLP). He presented to [facility] ED with complaints of generalized weakness, dizziness, falling, and speech difficulty. In ED, MR brain revealed acute CVA (acute infarcts within bilateral MCA/ACA). He was transferred and admitted to [facility] for further management. Further imaging revealed multifocal intracranial stenosis. Neurology was consulted who recommended NIR consult for possible diagnostic cerebral arteriogram for further evaluation.  CTA head/neck 05/31/2020: 1. Multiple hypodense foci along the bilateral ACA and left MCA vascular trees correlating with acute/subacute infarcts described on recent MRI. 2. Remote small lacunar infarct in the bilateral cerebral and left cerebellar hemisphere, as described above. 3. No hemodynamically significant stenosis in the major neck arteries. 4. Prominent luminal irregularity of the intracranial vasculature, may represent advanced atherosclerotic disease versus vasculitis. 5. New occlusion versus high-grade stenosis at the left A2/ACA segment. 6. Multifocal areas of high-grade stenosis the bilateral MCA vascular tree, as described above, with persistent occlusion of the distal left M3/MCA superior division branch and worsening stenosis of right M3/M branches.  MR  brain 05/30/2020: 1. Redemonstrated patchy cortical/subcortical infarcts within the mid left frontal lobe/frontal operculum, now subacute. 2. New from the prior brain MRI of 04/20/2020, there are small acute infarcts within the cortical and subcortical frontal lobes bilaterally, and within the left callosal body/genu (bilateral MCA and ACA territories). The largest acute infarct is present within the left callosal body/genu, measuring 2 cm. 3. 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 4. Stable background severe cerebral white matter chronic small vessel ischemic disease. 5. Stable mild generalized parenchymal atrophy. 6. Paranasal sinus disease, as described.  NIR consulted by Dr. Erlinda Hong for possible image-guided diagnostic cerebral arteriogram. Patient awake and alert laying in bed with no complaints at this time. States symptoms on ED presentation (generalized weakness, dizziness, falling, and speech difficulty) have subsided at this time. Denies fever, chills, chest pain, dyspnea, abdominal pain, or headache.  On DAPT (Plavix 75 mg/Aspirin 325 mg) once daily. On Lovenox SQ- LD 06/01/2020 at 2225.   Past Medical History:  Diagnosis Date  . Hypertension     Past Surgical History:  Procedure Laterality Date  . BUBBLE STUDY  06/01/2020   Procedure: BUBBLE STUDY;  Surgeon: Donato Heinz, MD;  Location: [place];  Service: Cardiovascular;;  . TEE WITHOUT CARDIOVERSION N/A 06/01/2020   Procedure: TRANSESOPHAGEAL ECHOCARDIOGRAM (TEE);  Surgeon: Donato Heinz, MD;  Location: [facility] ENDOSCOPY;  Service: Cardiovascular;  Laterality: N/A;    Allergies: Patient has no known allergies.  Medications: Prior to Admission medications   Medication Sig Start Date End Date Taking? Authorizing Provider  amLODipine (NORVASC) 10 MG tablet TAKE 1 TABLET BY MOUTH DAILY. 08/21/19 08/20/20 Yes Kerin Perna, NP  aspirin EC 325 MG EC tablet Take 1 tablet (325 mg total) by mouth daily. 04/24/20  Yes Arrien, Jimmy Picket, MD  atorvastatin (LIPITOR) 40 MG tablet  Take 40 mg by mouth daily.   Yes [provider]  clopidogrel (PLAVIX) 75 MG tablet Take 1 tablet (75 mg total) by mouth daily. 05/24/20 06/23/20 Yes Kerin Perna, NP  Insulin Glargine (BASAGLAR KWIKPEN) 100 UNIT/ML Inject 10 Units into the skin daily. 05/18/20  Yes Kerin Perna, NP  losartan-hydrochlorothiazide (HYZAAR) 100-25 MG tablet Take 1 tablet by mouth daily.   Yes [provider]  metFORMIN (GLUCOPHAGE) 1000 MG tablet Take 1 tablet (1,000 mg total) by mouth 2 (two) times daily with a meal. 05/18/20  Yes Kerin Perna, NP  atorvastatin (LIPITOR) 40 MG tablet Take 1 tablet (40 mg total) by mouth daily. 04/24/20 05/27/20  Arrien, Jimmy Picket, MD  blood glucose meter kit and supplies Dispense based on patient and insurance preference. Use up to four times daily as directed. (FOR ICD-10 E10.9, E11.9). 04/23/20   Arrien, Jimmy Picket, MD  Blood Glucose Monitoring Suppl (TRUE METRIX METER) w/Device KIT Use to check blood sugar TID. 05/18/20   Charlott Rakes, MD  glucose blood (TRUE METRIX BLOOD GLUCOSE TEST) test strip Use to check blood sugar TID. 05/18/20   Charlott Rakes, MD  losartan-hydrochlorothiazide (HYZAAR) 100-25 MG tablet Take 1 tablet by mouth daily. 04/23/20 05/27/20  Arrien, Jimmy Picket, MD  TRUEplus Lancets 28G MISC Use to check blood sugar 3 times daily. Patient taking differently: Use to check blood sugar 3 times daily. 05/23/20   Kerin Perna, NP     History reviewed. No pertinent family history.  Social History   Socioeconomic History  . Marital status: Single    Spouse name: Not on file  . Number of children: Not on file  . Years of education: Not on file  . Highest education level: Not on file  Occupational History  . Not on file  Tobacco Use  . Smoking status:  Current Every Day Smoker  . Smokeless tobacco: Never Used  Substance and Sexual Activity  . Alcohol use: Not Currently  . Drug use: Never  . Sexual activity: Not Currently  Other Topics Concern  . Not on file  Social History Narrative  . Not on file   Social Determinants of Health   Financial Resource Strain: Not on file  Food Insecurity: Not on file  Transportation Needs: Not on file  Physical Activity: Not on file  Stress: Not on file  Social Connections: Not on file     Review of Systems: A 12 point ROS discussed and pertinent positives are indicated in the HPI above.  All other systems are negative.  Review of Systems  Constitutional: Negative for chills and fever.  Respiratory: Negative for shortness of breath and wheezing.   Cardiovascular: Negative for chest pain and palpitations.  Gastrointestinal: Negative for abdominal pain.  Neurological: Negative for headaches.  Psychiatric/Behavioral: Negative for behavioral problems and confusion.    Vital Signs: BP (!) 153/105 (BP Location: Right Arm)   Pulse 89   Temp 98.4 F (36.9 C) (Oral)   Resp 20   Ht '6\' 1"'  (1.854 m)   Wt 230 lb (104.3 kg)   SpO2 98%   BMI 30.34 kg/m   Physical Exam Vitals and nursing note reviewed.  Constitutional:      General: He is not in acute distress. Cardiovascular:     Rate and Rhythm: Normal rate and regular rhythm.     Heart sounds: Normal heart sounds. No murmur heard.   Pulmonary:     Effort: Pulmonary effort is normal. No  respiratory distress.     Breath sounds: Normal breath sounds. No wheezing.  Skin:    General: Skin is warm and dry.  Neurological:     Mental Status: He is alert.     Comments: Moving all extremities.      MD Evaluation Airway: WNL Heart: WNL Abdomen: WNL Chest/ Lungs: WNL ASA  Classification: 3 Mallampati/Airway Score: Two   Imaging: CT ANGIO HEAD NECK W WO CM  Result Date: 05/31/2020 CLINICAL DATA:  Focal neurological deficit CT, more  than 6 hours, stroke suspected. EXAM: CT ANGIOGRAPHY HEAD AND NECK TECHNIQUE: Multidetector CT imaging of the head and neck was performed using the standard protocol during bolus administration of intravenous contrast. Multiplanar CT image reconstructions and MIPs were obtained to evaluate the vascular anatomy. Carotid stenosis measurements (when applicable) are obtained utilizing NASCET criteria, using the distal internal carotid diameter as the denominator. CONTRAST:  53m OMNIPAQUE IOHEXOL 350 MG/ML SOLN COMPARISON:  MRI of the brain May 30, 2020 FINDINGS: CT HEAD FINDINGS Brain: Multiple hypodense foci are seen along the bilateral ACA and left MCA territory, corresponding to infarct seen on recent MRI of the brain. Multiple additional hypodense foci are seen in the bilateral cerebral hemispheres, corresponding to chronic infarcts. Area as of encephalomalacia and gliosis in the left parietooccipital region and inferior left cerebellar hemisphere. No hemorrhage, hydrocephalus, extra-axial collection or mass lesion. Vascular: No hyperdense vessel. Skull: Normal. Negative for fracture or focal lesion. Sinuses: Mucosal thickening throughout the paranasal sinuses. Orbits: No acute finding. 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 Image portion shows no evidence of aneurysm or dissection. No significant stenosis at the origin of the major arch vessels. Right carotid system: No evidence of dissection, stenosis or occlusion. Left carotid system: No evidence of dissection, stenosis or occlusion. Vertebral arteries: Left dominant. No evidence of dissection, stenosis (50% or greater) or occlusion. Skeleton: Degenerative changes of the cervical spine at C5-6 and C6-7. No acute or aggressive process identified. Other neck: Mildly  prominent bilateral cervical lymph nodes, nonspecific. May be reactive. Prominent adenoid tissue. Upper chest: Negative. Review of the MIP images confirms the above findings CTA HEAD FINDINGS Anterior circulation: Intracranial bilateral internal carotid arteries abnormal course and caliber. Bilateral A1 segments are patent noting a hypoplastic right A1/ACA. Interval development of a new focal occlusion versus high-grade stenosis at the mid A2 segment of the left anterior cerebral artery with normal distal opacification. Luminal irregularity is noted along the bilateral ACA vascular trees. Interval worsening of bilateral multifocal areas of stenosis of the bilateral MCA vascular tree preserving the bilateral M1 segments with severe stenosis at the proximal left M2 segment and persistent occlusion at the distal left M3/MCA superior division branch. Severe stenosis at the proximal right M2/MCA superior division branch and M3/MCA posterior division branches. Posterior circulation: The intracranial bilateral vertebral arteries and basilar artery are maintained. Luminal irregularity is seen along the bilateral posterior cerebral arteries with multifocal areas of mild stenosis. Venous sinuses: Poorly opacified. Review of the MIP images confirms the above findings IMPRESSION: 1. Multiple hypodense foci along the bilateral ACA and left MCA vascular trees correlating with acute/subacute infarcts described on recent MRI. 2. Remote small lacunar infarct in the bilateral cerebral and left cerebellar hemisphere, as described above. 3. No hemodynamically significant stenosis in the major neck arteries. 4. Prominent  luminal irregularity of the intracranial vasculature, may represent advanced atherosclerotic disease versus vasculitis. 5. New occlusion versus high-grade stenosis at the left A2/ACA segment. 6. Multifocal areas of high-grade stenosis the bilateral MCA vascular tree, as described above, with persistent occlusion of the  distal left M3/MCA superior division branch and worsening stenosis of right M3/M branches. Electronically Signed   By: Pedro Earls M.D.   On: 05/31/2020 18:11   CT HEAD WO CONTRAST  Result Date: 05/30/2020 CLINICAL DATA:  Mental status changes. EXAM: CT HEAD WITHOUT CONTRAST TECHNIQUE: Contiguous axial images were obtained from the base of the skull through the vertex without intravenous contrast. COMPARISON:  04/21/2020 FINDINGS: Brain: Periventricular white matter changes are consistent with small vessel disease. Remote infarcts involving the LEFT frontal lobe, LEFT posterior parietal lobe, LEFT cerebellum. Stable appearance of patchy low-attenuation in the RIGHT corona radiata. There is no intra or extra-axial fluid collection or mass lesion. The basilar cisterns and ventricles have a normal appearance. There is no CT evidence for acute infarction or hemorrhage. Vascular: No hyperdense vessel or unexpected calcification. Skull: Normal. Negative for fracture or focal lesion. Sinuses/Orbits: Moderate mucosal thickening of the paranasal sinuses. No air-fluid levels. Other: None. IMPRESSION: 1. Stable appearance of chronic LEFT infarcts. 2. Periventricular white matter changes are stable. 3.  No evidence for acute intracranial abnormality. 4. Chronic sinus changes. Electronically Signed   By: Nolon Nations M.D.   On: 05/30/2020 14:34   MR Brain Wo Contrast (neuro protocol)  Result Date: 05/30/2020 CLINICAL DATA:  Mental status change, unknown cause. EXAM: MRI HEAD WITHOUT CONTRAST TECHNIQUE: Multiplanar, multiecho pulse sequences of the brain and surrounding structures were obtained without intravenous contrast. COMPARISON:  Noncontrast head CT performed earlier today 05/30/2020. CT angiogram head/neck 04/21/2020. Brain MRI 04/20/2020. FINDINGS: Brain: Mild cerebral and cerebellar atrophy. Redemonstrated patchy cortical/subcortical infarcts within the mid left frontal lobe/frontal  operculum, now subacute. However, new from the prior brain MRI of 04/20/2020, there are new acute infarcts within the cortical and subcortical bilateral frontal lobes, and within the left callosal body/genu (bilateral MCA and ACA territories). The largest acute infarct is present within the left callosal body/genu and measures 2 cm. Redemonstrated chronic cortical/subcortical infarct within the left parietal lobe. Redemonstrated chronic infarct within the left cerebellar hemisphere. Background moderate severe multifocal T2/FLAIR hyperintensity within the cerebral white matter, nonspecific but compatible with chronic small vessel ischemic disease. This includes chronic lacunar infarcts within the bilateral centrum semiovale and corona radiata. Redemonstrated chronic microhemorrhages within the bilateral parietal lobes. No evidence of intracranial mass. No extra-axial fluid collection. No midline shift. Vascular: Expected proximal arterial flow voids. Skull and upper cervical spine: No focal marrow lesion. Sinuses/Orbits: Visualized orbits show no acute finding. Mild mucosal thickening within the bilateral frontal sinuses. Moderate bilateral ethmoid sinus mucosal thickening. Mild bilateral maxillary sinus mucosal thickening. IMPRESSION: Redemonstrated patchy cortical/subcortical infarcts within the mid left frontal lobe/frontal operculum, now subacute. New from the prior brain MRI of 04/20/2020, there are small acute infarcts within the cortical and subcortical frontal lobes bilaterally, and within the left callosal body/genu (bilateral MCA and ACA territories). The largest acute infarct is present within the left callosal body/genu, measuring 2 cm. 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 Stable background severe cerebral white matter chronic small vessel ischemic disease. Stable mild generalized parenchymal atrophy.  Paranasal sinus disease, as described. Electronically Signed   By: Kellie Simmering DO   On: 05/30/2020  16:10   ECHO TEE  Result Date: 06/01/2020    TRANSESOPHOGEAL ECHO REPORT   Patient Name:   Nathaniel Spears Date of Exam: 06/01/2020 Medical Rec #:  [id number]      Height:       73.0 in Accession #:    [id number]     Weight:       230.0 lb Date of Birth:  03/19/1975       BSA:          2.283 m Patient Age:    38 years       BP:           185/111 mmHg Patient Gender: M              HR:           101 bpm. Exam Location:  Inpatient Procedure: Transesophageal Echo, Color Doppler, Cardiac Doppler and Saline            Contrast Bubble Study Indications:     Stroke  History:         Patient has prior history of Echocardiogram examinations, most                  recent 04/21/2020. Risk Factors:Hypertension.  Sonographer:     Bernadene Person RDCS Referring Phys:  [id number] Leanor Kail Diagnosing Phys: Oswaldo Milian MD PROCEDURE: After discussion of the risks and benefits of a TEE, an informed consent was obtained from the patient. The transesophogeal probe was passed without difficulty through the esophogus of the patient. Local oropharyngeal anesthetic was provided with Cetacaine. Sedation performed by different physician. The patient was monitored while under deep sedation. Anesthestetic sedation was provided intravenously by Anesthesiology: 477.7m of Propofol. The patient developed Oropharyngeal bleeding during  the procedure but no further bleeding was noted during recovery. IMPRESSIONS  1. Left ventricular ejection fraction, by estimation, is 60 to 65%. The left ventricle has normal function. The left ventricle has no regional wall motion abnormalities. There is severe left ventricular hypertrophy.  2. Right ventricular systolic function is normal. The right ventricular size is normal.  3. No left atrial/left atrial appendage thrombus was detected.  4. The mitral valve is normal in structure. Trivial mitral  valve regurgitation.  5. The aortic valve is tricuspid. Aortic valve regurgitation is trivial.  6. Agitated saline contrast bubble study was negative, with no evidence of any interatrial shunt. Conclusion(s)/Recommendation(s): No LA/LAA thrombus identified. Negative bubble study for interatrial shunt. No intracardiac source of embolism detected on this on this transesophageal echocardiogram. FINDINGS  Left Ventricle: Left ventricular ejection fraction, by estimation, is 60 to 65%. The left ventricle has normal function. The left ventricle has no regional wall motion abnormalities. The left ventricular internal cavity size was normal in size. There is  severe left ventricular hypertrophy. Right Ventricle: The right ventricular size is normal. No increase in right ventricular wall thickness. Right ventricular systolic function is normal. Left Atrium: Left atrial size was normal in size. No left atrial/left atrial appendage thrombus was detected. Right Atrium: Right atrial size was normal in size. Pericardium: There is no evidence of pericardial effusion. Mitral Valve: The mitral valve is normal in structure. Trivial mitral valve regurgitation. Tricuspid Valve: The tricuspid valve is normal in structure. Tricuspid valve regurgitation is trivial. Aortic Valve: The aortic valve is tricuspid. Aortic valve regurgitation is trivial. Pulmonic Valve: The pulmonic valve was grossly normal. Pulmonic valve regurgitation is not visualized. Aorta: The aortic root is normal in size and  structure. IAS/Shunts: No atrial level shunt detected by color flow Doppler. Agitated saline contrast was given intravenously to evaluate for intracardiac shunting. Agitated saline contrast bubble study was negative, with no evidence of any interatrial shunt. Oswaldo Milian MD Electronically signed by Oswaldo Milian MD Signature Date/Time: 06/01/2020/6:21:55 PM    Final     Labs:  CBC: Recent Labs    05/30/20 1326 05/30/20 1340  05/31/20 0330 06/01/20 0059 06/02/20 0038  WBC 7.5  --  6.9 6.8 7.4  HGB 12.7* 12.6* 11.4* 11.8* 12.2*  HCT 38.7* 37.0* 35.1* 36.8* 37.6*  PLT 175  --  174 200 222    COAGS: Recent Labs    02/10/20 1858 04/20/20 1321 05/30/20 1326  INR 1.0 1.1 1.1  APTT '27 28 30    ' BMP: Recent Labs    05/30/20 1326 05/30/20 1340 05/31/20 0330 06/01/20 0059 06/02/20 0038  NA 140 142 140 140 139  K 3.4* 3.4* 3.0* 3.3* 3.5  CL 103 99 107 108 105  CO2 31  --  '24 25 27  ' GLUCOSE 221* 215* 120* 117* 99  BUN '19 17 14 10 7  ' CALCIUM 8.4*  --  8.2* 8.5* 8.7*  CREATININE 1.50* 1.50* 1.17 1.12 1.24  GFRNONAA 58*  --  >60 >60 >60    LIVER FUNCTION TESTS: Recent Labs    02/10/20 1858 04/20/20 1321 05/30/20 1326  BILITOT 0.5 1.2 0.4  AST 20 18 14*  ALT '20 18 14  ' ALKPHOS 74 86 69  PROT 7.4 7.7 7.3  ALBUMIN 3.8 4.1 3.7     Assessment and Plan:  Recurrent CVA (acute infarct of left middle frontal lobe 04/20/2020; new acute infarcts within bilateral MCA/ACA this admission) in setting of multifocal intracranial stenosis, cocaine abuse (UDS positive 1 month ago, negative this admission), uncontrolled hypertension/diabetes mellitus type II, and heavy tobacco abuse. Plan for image-guided diagnostic cerebral arteriogram in IR with Dr. Estanislado Pandy today pending IR schedule. Patient is NPO. Afebrile and WBCs WNL. Ok to proceed with Plavix/Aspirin/Lovenox per Dr. Estanislado Pandy. INR 1.1 05/30/2020.  Risks and benefits of diagnostic cerebral angiogram were discussed with the patient including, but not limited to bleeding, infection, vascular injury, stroke, or contrast induced renal failure. This interventional procedure involves the use of X-rays and because of the nature of the planned procedure, it is possible that we will have prolonged use of X-ray fluoroscopy. Potential radiation risks to you include (but are not limited to) the following: - A slightly elevated risk for cancer  several years later in  life. This risk is typically less than 0.5% percent. This risk is low in comparison to the normal incidence of human cancer, which is 33% for women and 50% for men according to the [place]. - Radiation induced injury can include skin redness, resembling a rash, tissue breakdown / ulcers and hair loss (which can be temporary or permanent).  The likelihood of either of these occurring depends on the difficulty of the procedure and whether you are sensitive to radiation due to previous procedures, disease, or genetic conditions.  IF your procedure requires a prolonged use of radiation, you will be notified and given written instructions for further action.  It is your responsibility to monitor the irradiated area for the 2 weeks following the procedure and to notify your physician if you are concerned that you have suffered a radiation induced injury.   All of the patient's questions were answered, patient is agreeable to proceed. Consent signed and in IR  control room.   Thank you for this interesting consult.  I greatly enjoyed meeting [place] and look forward to participating in their care.  A copy of this report was sent to the requesting provider on this date.  Electronically Signed: Earley Abide, PA-C 06/02/2020, 9:45 AM   I spent a total of 20 Minutes in face to face in clinical consultation, greater than 50% of which was counseling/coordinating care for CVA/diagnostic cerebral arteriogram.

## 2020-06-02 NOTE — TOC Initial Note (Signed)
Transition of Care [facility]) - Initial/Assessment Note    Patient Details  Name: Nathaniel Spears MRN: [id number] Date of Birth: 07/17/1975  Transition of Care [facility] CM/SW Contact:    Kingsley Plan, RN Phone Number: 06/02/2020, 11:54 AM  Clinical Narrative:                 Spoke to patient's girl friend [name] at bedside. Patient has PCP Dr Gwinda Passe. Patient already going to Neuro rehab for OT,SP. Will enter a continuation of care order.   [name] provides 24 hour care at home. Discussed medicaid process.   Expected Discharge Plan: Home/Self Care Barriers to Discharge: Continued Medical Work up   Patient Goals and CMS Choice        Expected Discharge Plan and Services Expected Discharge Plan: Home/Self Care   Discharge Planning Services: CM Consult   Living arrangements for the past 2 months: Single Family Home                 DME Arranged: N/A         HH Arranged: NA          Prior Living Arrangements/Services Living arrangements for the past 2 months: Single Family Home Lives with:: Significant Other Patient language and need for interpreter reviewed:: Yes        Need for Family Participation in Patient Care: Yes (Comment) Care giver support system in place?: Yes (comment)   Criminal Activity/Legal Involvement Pertinent to Current Situation/Hospitalization: No - Comment as needed  Activities of Daily Living      Permission Sought/Granted   Permission granted to share information with : Yes, Verbal Permission Granted  Share Information with NAME: [name] girl friend           Emotional Assessment       Orientation: : Oriented to Self,Oriented to Place Alcohol / Substance Use: Not Applicable Psych Involvement: No (comment)  Admission diagnosis:  Acute CVA (cerebrovascular accident) (HCC) [I63.9] Cerebrovascular accident (CVA), unspecified mechanism (HCC) [I63.9] Patient Active Problem List   Diagnosis Date Noted  . CVA (cerebral  vascular accident) (HCC) 04/21/2020  . Aphasia   . Polysubstance abuse (HCC)   . Type 2 diabetes mellitus without complication (HCC)   . Hypokalemia   . Acute CVA (cerebrovascular accident) (HCC) 04/20/2020  . Essential hypertension 08/21/2019  . Obesity 08/21/2019   PCP:  Grayce Sessions, NP Pharmacy:   [facility] [address] [place] [address] Phone: [phone] Fax: [phone]  [facility] [address] [place] [address] Phone: [phone] Fax: [phone]     Social Determinants of Health (SDOH) Interventions    Readmission Risk Interventions No flowsheet data found.

## 2020-06-02 NOTE — Progress Notes (Signed)
STROKE TEAM PROGRESS NOTE   INTERVAL HISTORY Fiance is at the bedside. Pt lying in bed with leg extended post angio today. The angio showed multifocal athero at left MCA and b/l ACAs. No vasculitis seen.   Vitals:   06/02/20 1220 06/02/20 1225 06/02/20 1235 06/02/20 1339  BP: (!) 169/99 (!) 151/99 (!) 154/92 (!) 153/89  Pulse: 92 92 90 96  Resp: 19 20 18  (!) 21  Temp:    99.8 F (37.7 C)  TempSrc:    Oral  SpO2: 100% 98% 98% 97%  Weight:      Height:       CBC:  Recent Labs  Lab 06/01/20 0059 06/02/20 0038  WBC 6.8 7.4  NEUTROABS 3.5 4.3  HGB 11.8* 12.2*  HCT 36.8* 37.6*  MCV 79.8* 79.0*  PLT 200 222   Basic Metabolic Panel:  Recent Labs  Lab 06/01/20 0059 06/02/20 0038  NA 140 139  K 3.3* 3.5  CL 108 105  CO2 25 27  GLUCOSE 117* 99  BUN 10 7  CREATININE 1.12 1.24  CALCIUM 8.5* 8.7*  MG 1.8 1.6*   Lipid Panel:  Recent Labs  Lab 05/31/20 0330  CHOL 74  TRIG 55  HDL 24*  CHOLHDL 3.1  VLDL 11  LDLCALC 39   HgbA1c:  Recent Labs  Lab 05/31/20 0330  HGBA1C 10.3*   Urine Drug Screen:  Recent Labs  Lab 05/30/20 1644  LABOPIA NONE DETECTED  COCAINSCRNUR NONE DETECTED  LABBENZ NONE DETECTED  AMPHETMU NONE DETECTED  THCU NONE DETECTED  LABBARB NONE DETECTED    Alcohol Level  Recent Labs  Lab 05/30/20 1326  ETH <10    IMAGING past 24 hours  Diagnostic cerebral angiogram 1.Occluded Lt . MCA  Sup division in mid to distal M2 seg. 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 3. Mod severe focal  stenosis of Lt MCA inf division parietal branch. 4. Focal stenosis of Lt ACA prox pericallosal branches.  PHYSICAL EXAM Constitutional: Appears well-developed and well-nourished.  Psych: Affect appropriate to situation Eyes: No scleral injection HENT: No OP obstruction MSK: no joint deformities.  Cardiovascular: Normal rate and regular rhythm.  Respiratory: Effort normal, non-labored breathing GI: Soft.  No distension. There is no  tenderness.  Skin: WDI Neuro: awake, alert,flat affect,eyes open, orientated to place andpeople, but not to age or month. No aphasiabut paucity of speech, following all simple commands. Able to name and repeat.Slight dysarthria.No gaze palsy, tracking bilaterally, visual field full on confrontation, PERRL. No facial droop. Tongue midline. Bilateral UEsproximal 4/5, and distal left 3/5 and right 4/5, no drift. Bilaterally LEsproximal 4+/5,and distal left 3-/5 and right 4+/5, nodrift. Sensation symmetrical bilaterally, b/l FTN intact, gait not tested.   ASSESSMENT/PLAN Nathaniel Spears is a 45 yo male with PMH recent left MCA branch CVA a month ago in the setting of multiple uncontrolled stroke risk factors and multifocal ntracranial artherosclerosis. His PMH includes  HTN, substance use (cocaine positive last admission), DMII, and  Obesity. He presented with worsening left lower extremity weakness, dizziness, falling, and ongoing aphasia. Found to have new stroke in bilateral MCA and ACA territories.   Stroke - recurrent stroke with new acute infarcts within bilateral MCA and ACA in the setting of multifocal intracranial stenosis, cocaine abuse, uncontrolled hypertension and DM2, along with heavy tobacco abuse.   HCT: No evidence for acute intracranial abnormality. Stable chronic left infarcts.   MRI: small acute infarcts within the cortical and subcortical frontal lobes  bilaterally, and within the left callosal body/genu (bilateral MCA and ACA territories). The largest acute infarct is present within the left callosal body/genu, measuring 2 cm.  CTA: No LVO. Severe atheromatous disease involving the distal MCA branches bilaterally with associated moderate to severe multifocal bilateral M2 and M3 stenoses. The severely diseased MCA branches remain perfused and grossly symmetric at this time.  TEE no source of emboli, no PFO  Cerebral angio Occluded Lt . MCA  Sup division in mid to distal M2 seg.  Severe tandem stenosis of RTACA distal A2 and prox A3. Mod severe focal  stenosis of Lt MCA inf division parietal branch. Focal stenosis of Lt ACA prox pericallosal branches.  Discussed with Dr. Corliss Skains, angiogram not consistent with vasculitis pattern  A1c 10.3  LDL 39  Was on ASA 325mg  and Plavix 75mg  x 3 months prior to admission, continue the same for 3 months and then plavix alone  History of stroke  Patient stroke in 04/2020 with aphasia, intermittent headache and dizziness.  CT showed old left cerebellum and left parieto-occipital infarct.  MRI showed left MCA patchy infarcts.  MRA and CTA head and neck showed bilateral MCA branch high-grade stenosis.  EF 60 to 65%.  LDL 53, A1c 11.1, UDS positive for cocaine.  Patient discharged with DAPT and Lipitor 40.  Multifocal intracranial stenosis  MRA April 2022 and now with bilateral narrowing of the middle cerebral arteries  This admission CTA Severe atheromatous disease involving the distal MCA branches bilaterally with associated moderate to severe multifocal bilateral M2 and M3 stenoses. The severely diseased MCA branches remain perfused and grossly symmetric at the time.  The April 2022 left MCA infarct likely related to left MCA branch stenosis  Cerebral angio Occluded Lt . MCA  Sup division in mid to distal M2 seg. Severe tandem stenosis of RTACA distal A2 and prox A3. Mod severe focal  stenosis of Lt MCA inf division parietal branch. Focal stenosis of Lt ACA prox pericallosal branches.  Consistent with atherosclerosis  On DAPT prior to admission  Hypertension  Stable  Avoid low BP  Long-term BP goal 130-150 given severe intracranial stenosis  Hyperlipidemia  Home meds: Lipitor 40mg    LDL 53, at goal < 70   lipitor 40 resumed  Continue statin at discharge  Diabetes type II Uncontrolled  New diagnosis last month   HgbA1c 10.3, goal < 7.0  Received DM education and support from DM coordinator last  month  SSI  CBGs  Management per primary team.   Tobacco abuse  Has quit since last admission  Cocaine abuse   UDS positive for cocaine last month   He reports he quit   Other Stroke Risk Factors  ETOH use, alcohol level <10, advised to drink no more than 1 drink(s) a day  Obesity, Body mass index is 32.95 kg/m., BMI >/= 30 associated with increased stroke risk, recommend weight loss, diet and exercise as appropriate   Other Active Problems    Hospital day # 3  Neurology will sign off. Please call with questions. Pt will follow up with stroke clinic Dr. May 2022 at [facility] in about 4 weeks. Thanks for the consult.   , MD PhD Stroke Neurology 06/02/2020 2:41 PM     To contact Stroke Continuity provider, please refer to [facility]. After hours, contact General Neurology

## 2020-06-02 NOTE — Progress Notes (Signed)
OT Cancellation Note  Patient Details Name: Nathaniel Spears MRN: [id number] DOB: 02/03/1975   Cancelled Treatment:    Reason Eval/Treat Not Completed: Medical issues which prohibited therapy;Other (comment) Pt with active besdrest order post Right femoral sheath removal. Will check back as pt medically appropriate.   Pollyann Glen K., COTA/L Acute Rehabilitation Services [phone]   Barron Schmid 06/02/2020, 2:13 PM

## 2020-06-02 NOTE — Anesthesia Postprocedure Evaluation (Addendum)
Anesthesia Post Note  Patient: Nathaniel Spears  Procedure(s) Performed: TRANSESOPHAGEAL ECHOCARDIOGRAM (TEE) (N/A ) BUBBLE STUDY     Patient location during evaluation: Endoscopy Anesthesia Type: MAC Level of consciousness: awake and alert Pain management: pain level controlled Vital Signs Assessment: post-procedure vital signs reviewed and stable Respiratory status: spontaneous breathing, nonlabored ventilation and respiratory function stable Cardiovascular status: blood pressure returned to baseline and stable Postop Assessment: no apparent nausea or vomiting Anesthetic complications: no   No complications documented.  Last Vitals:  Vitals:   06/02/20 1339 06/02/20 1716  BP: (!) 153/89 (!) 141/103  Pulse: 96 96  Resp: (!) 21 (!) 22  Temp: 37.7 C 37.3 C  SpO2: 97% 96%    Last Pain:  Vitals:   06/02/20 1716  TempSrc: Oral  PainSc:    Pain Goal:                   Nathaniel Spears

## 2020-06-02 NOTE — Progress Notes (Signed)
PROGRESS NOTE    Isadore Palecek  HKV:[id number] DOB: Mar 16, 1980 DOA: 05/30/2020 PCP: Grayce Sessions, NP   Brief Narrative:  Is an obese African-American male with a past medical history significant for but not limited to hypertension, cocaine abuse, diabetes mellitus type 2, obesity as well as a recent CVA who was just discharged on 04/23/2020 who presented with worsening left lower extremity weakness, dizziness, falling and ongoing aphasia.  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  He was hospitalized for further management and seen by neurology.  He underwent TEE on 06/01/2020 which showed no cardiac source of embolism seen and negative bubble study.  Today on 06/02/2020 he underwent a cerebral arteriogram which showed multiple vessel arthrosclerosis but no evidence of vasculitis.  Neurology recommends continuing lifestyle modification as well as compliance with medications.  He is now on aspirin 325 daily as well as Plavix 75 mg daily as well as a statin.  Neurology recommends observing 1 more night and discharging home in the morning.  Assessment & Plan:   Principal Problem:   Acute CVA (cerebrovascular accident) [facility]) Active Problems:   Essential hypertension   Obesity   Type 2 diabetes mellitus without complication (HCC)  Acute CVA -Recently hospitalized last month with acute CVA at that time as well -Admission Head CT Scan showed "Stable appearance of chronic LEFT infarcts. Periventricular white matter changes are stable. No evidence for acute intracranial abnormality. Chronic sinus changes." -CTA Head and Neck showed "Multiple hypodense foci along the bilateral ACA and left MCA vascular trees correlating with acute/subacute infarcts described on recent MRI. Remote small lacunar infarct in the bilateral cerebral and left cerebellar  hemisphere, as described above. No hemodynamically significant stenosis in the major neck arteries. Prominent luminal irregularity of the intracranial vasculature, may represent advanced atherosclerotic disease versus vasculitis. New occlusion versus high-grade stenosis at the left A2/ACA segment. Multifocal areas of high-grade stenosis the bilateral MCA vascular tree, as described above, with persistent occlusion of the distal left M3/MCA superior division branch and worsening stenosis of right M3/M branches" -Repeat brain MRI done this admission showed "Redemonstrated patchy cortical/subcortical infarcts within the mid left frontal lobe/frontal operculum, now subacute. New from the prior brain MRI of 04/20/2020, there are small acute infarcts within the cortical and subcortical frontal lobes bilaterally, and within the left callosal body/genu (bilateral MCA and ACA territories). The largest acute infarct is present within the left callosal body/genu, measuring 2 cm. 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 Stable background severe cerebral white matter chronic small vessel ischemic disease. Stable mild generalized parenchymal atrophy.Paranasal sinus disease, as described." -Patient underwent a TTE which showed no cardiac source of emboli and there is no PFO given a normal bubble study -Patient underwent a cerebral angiogram which showed "Occluded Lt . MCA  Sup division in mid to distal M2 seg. Severe tandem stenosis of RTACA distal A2 and prox A3.  Mod severe focal  stenosis of Lt MCA inf division parietal branch. Focal stenosis of Lt ACA prox pericallosal branches." -Currently not a LP candidate given that he is on Plavix and neurology recommending outpatient LP if necessary -Neurology recommending continuing aspirin 325 mg p.o. daily as well as Plavix 75 mg p.o. daily for 3 months and recommending considering a  second  bottle into combination as an outpatient -Recently had a CVA in 04/2020 with aphasia, intermittent headache and dizziness.  At that time he was discharged on dual antiplatelet therapy and Lipitor 40 mg which will be continued at discharge -Hemoglobin A1c is 10.3 -Lipid panel done this admission showed a total cholesterol/HDL ratio 3.1, cholesterol level 74, HDL of 24, LDL 39, triglycerides of 55, VLDL of 11 -Neurology recommends medication compliance as well as lifestyle changes -PT/OT and SLP recommending outpatient Therapies   Multifocal intracranial stenosis -Per neurology -He has severe atherectomized disease involving the distal MCA branches bilaterally with associated moderate to severe multifocal bilateral M2 M3 stenosis -Neurology recommends dual antiplatelet therapy with aspirin 325 mg daily as well as clopidogrel 75 mg p.o. daily  Hypokalemia -[name] K+ was 3.5 -Replete with po KCl 40 mEQ BID x2 -Mag Level was 1.6 and will replete with IV Mag Sulfate 2 grams -Continue to Monitor and Replete as Necessary -Repeat CMP in the AM  Hypomagnesemia -[name] Mag Level was 1.6 -Replete with IV Mag Sulfate 2 grams -Continue to Monitor and Replete as Necessary  -Repeat Mag Level in the AM   Hypertension -Neurology recommending avoiding low blood pressure and recommending keeping a blood pressure goal systolic between 621 and 150 given his severe intracranial stenosis to prevent hypoperfusion -C/w Permissive TN  -Current blood pressure reading was 153/89 and within goal -Continue with labetalol 10 mg IV every 4 as needed for systolic blood pressure greater than 220 or diastolic blood pressure greater than 120 and continue with hydralazine 10 mg IV every 4 as needed second if unable to give labetalol for systolic blood pressure greater than 220 or diastolic blood pressure below 308 -Continue to monitor blood pressures per protocol  Tobacco Abuse -Smoking cessation counseling  given  Cocaine Abuse -Tested positive for cocaine on UDS last admission -Continue monitor and counseling was given  Uncontrolled Diabetes Mellitus Type 2 -Recent hemoglobin A1c was 10.3 -Goal is less than 7.0 continue carb modified diet -Continue with resistant NovoLog/scale insulin AC; Lantus currently on hold -We will consult diabetes education coordinator for further assistance -CBGs ranging from 89-121 and reasonably well controlled   Microcytic Anemia -Relatively stable. [name] Hgb/Hct went from 11.4/35.1 -> 11.8/79.8 -> 12.2/37.6 -Check Anemia Panel in the AM -Continue to Monitor for S/Sx of Bleeding; Currently no overt bleeding noted -Repeat CBC in the AM   Obesity -Complicates overall prognosis and care -Estimated body mass index is 30.34 kg/m as calculated from the following:   Height as of this encounter:  (1.854 m).   Weight as of this encounter: 104.3 kg. -Weight Loss and Dietary Counseling given  DVT prophylaxis: Enoxaparin 40 over subcu q. 24 Code Status: FULL CODE  Family Communication: Discussed with Girlfriend at bedside  Disposition Plan: Pending further neuro clearance and evaluation by PT and OT.  Neurology recommends observing 1 more night today given his recent cerebral angiogram and discharging with outpatient therapies in the morning  Status is: Inpatient  Remains inpatient appropriate because:Unsafe d/c plan, IV treatments appropriate due to intensity of illness or inability to take PO and Inpatient level of care appropriate due to severity of illness   Dispo:  Patient From: Home  Planned Disposition: Home  Medically stable for discharge: No     Consultants:   Neurology   Neuro-Ingerventional Radiology    Procedures:  TEE  Cerebral Arteriogram Findings. 1.Occluded Lt . MCA  Sup division in mid to distal M2 seg. 2.Severe tandem stenosis  of RTACA distal A2 and prox A3. 3. Mod severe focal  stenosis of Lt MCA inf division  parietal branch. 4. Focal stenosis of Lt ACA prox pericallosal branches.  Antimicrobials:  Anti-infectives (From admission, onward)   None        Subjective: Seen and examined at bedside and he was feeling okay.  States he slept fairly well.  No nausea or vomiting.  Denies any lightheadedness or dizziness.  Still feels some weakness.  No other concerns or complaints at this time.  Objective: Vitals:   06/02/20 1220 06/02/20 1225 06/02/20 1235 06/02/20 1339  BP: (!) 169/99 (!) 151/99 (!) 154/92 (!) 153/89  Pulse: 92 92 90 96  Resp: 19 20 18  (!) 21  Temp:    99.8 F (37.7 C)  TempSrc:    Oral  SpO2: 100% 98% 98% 97%  Weight:      Height:        Intake/Output Summary (Last 24 hours) at 06/02/2020 1418 Last data filed at 06/02/2020 0809 Gross per 24 hour  Intake 120 ml  Output 110 ml  Net 10 ml   Filed Weights   05/30/20 1349 06/01/20 1220  Weight: 114.6 kg 104.3 kg   Examination: Physical Exam:  Constitutional: WN/WD obese AAM in NAD and appears calm and comfortable Eyes: Lids and conjunctivae normal, sclerae anicteric  ENMT: External Ears, Nose appear normal. Grossly normal hearing.  Neck: Appears normal, supple, no cervical masses, normal ROM, no appreciable thyromegaly; no JVD Respiratory: Diminished to auscultation bilaterally, no wheezing, rales, rhonchi or crackles. Normal respiratory effort and patient is not tachypenic. No accessory muscle use.  Unlabored breathing Cardiovascular: RRR, no murmurs / rubs / gallops. S1 and S2 auscultated.  Minimal extremity edema Abdomen: Soft, non-tender, non-distended. Bowel sounds positive.  GU: Deferred. Musculoskeletal: No clubbing / cyanosis of digits/nails. No joint deformity upper and lower extremities.  Skin: No rashes, lesions, ulcers on limited skin evaluation. No induration; Warm and dry.  Neurologic: CN 2-12 grossly intact with no focal deficits. Romberg sign and cerebellar reflexes not assessed.  Psychiatric: Normal  judgment and insight. Alert and oriented x 3. Normal mood and appropriate affect.   Data Reviewed: I have personally reviewed following labs and imaging studies  CBC: Recent Labs  Lab 05/30/20 1326 05/30/20 1340 05/31/20 0330 06/01/20 0059 06/02/20 0038  WBC 7.5  --  6.9 6.8 7.4  NEUTROABS 5.0  --  3.7 3.5 4.3  HGB 12.7* 12.6* 11.4* 11.8* 12.2*  HCT 38.7* 37.0* 35.1* 36.8* 37.6*  MCV 79.5*  --  80.0 79.8* 79.0*  PLT 175  --  174 200 222   Basic Metabolic Panel: Recent Labs  Lab 05/30/20 1326 05/30/20 1340 05/31/20 0330 06/01/20 0059 06/02/20 0038  NA 140 142 140 140 139  K 3.4* 3.4* 3.0* 3.3* 3.5  CL 103 99 107 108 105  CO2 31  --  24 25 27   GLUCOSE 221* 215* 120* 117* 99  BUN 19 17 14 10 7   CREATININE 1.50* 1.50* 1.17 1.12 1.24  CALCIUM 8.4*  --  8.2* 8.5* 8.7*  MG  --   --  1.5* 1.8 1.6*   GFR: Estimated Creatinine Clearance: 95.4 mL/min (by C-G formula based on SCr of 1.24 mg/dL). Liver Function Tests: Recent Labs  Lab 05/30/20 1326  AST 14*  ALT 14  ALKPHOS 69  BILITOT 0.4  PROT 7.3  ALBUMIN 3.7   No results for input(s): LIPASE, AMYLASE in the last 168 hours. Recent Labs  Lab 05/30/20 1328  AMMONIA 38*   Coagulation Profile: Recent Labs  Lab 05/30/20 1326  INR 1.1   Cardiac Enzymes: No results for input(s): CKTOTAL, CKMB, CKMBINDEX, TROPONINI in the last 168 hours. BNP (last 3 results) No results for input(s): PROBNP in the last 8760 hours. HbA1C: Recent Labs    05/31/20 0330  HGBA1C 10.3*   CBG: Recent Labs  Lab 06/01/20 1201 06/01/20 1702 06/01/20 2103 06/02/20 0808 06/02/20 1323  GLUCAP 99 112* 91 121* 89   Lipid Profile: Recent Labs    05/31/20 0330  CHOL 74  HDL 24*  LDLCALC 39  TRIG 55  CHOLHDL 3.1   Thyroid Function Tests: No results for input(s): TSH, T4TOTAL, FREET4, T3FREE, THYROIDAB in the last 72 hours. Anemia Panel: No results for input(s): VITAMINB12, FOLATE, FERRITIN, TIBC, IRON, RETICCTPCT in the last  72 hours. Sepsis Labs: No results for input(s): PROCALCITON, LATICACIDVEN in the last 168 hours.  Recent Results (from the past 240 hour(s))  Resp Panel by RT-PCR (Flu A&B, Covid) Nasopharyngeal Swab     Status: None   Collection Time: 05/30/20  1:55 PM   Specimen: Nasopharyngeal Swab; Nasopharyngeal(NP) swabs in vial transport medium  Result Value Ref Range Status   SARS Coronavirus 2 by RT PCR NEGATIVE NEGATIVE Final    Comment: (NOTE) SARS-CoV-2 target nucleic acids are NOT DETECTED.  The SARS-CoV-2 RNA is generally detectable in upper respiratory specimens during the acute phase of infection. The lowest concentration of SARS-CoV-2 viral copies this assay can detect is 138 copies/mL. A negative result does not preclude SARS-Cov-2 infection and should not be used as the sole basis for treatment or other patient management decisions. A negative result may occur with  improper specimen collection/handling, submission of specimen other than nasopharyngeal swab, presence of viral mutation(s) within the areas targeted by this assay, and inadequate number of viral copies(<138 copies/mL). A negative result must be combined with clinical observations, patient history, and epidemiological information. The expected result is Negative.  Fact Sheet for Patients:  BloggerCourse.com  Fact Sheet for Healthcare Providers:  SeriousBroker.it  This test is no t yet approved or cleared by the [place] FDA and  has been authorized for detection and/or diagnosis of SARS-CoV-2 by FDA under an Emergency Use Authorization (EUA). This EUA will remain  in effect (meaning this test can be used) for the duration of the COVID-19 declaration under Section 564(b)(1) of the Act, 21 U.S.C.section 360bbb-3(b)(1), unless the authorization is terminated  or revoked sooner.       Influenza A by PCR NEGATIVE NEGATIVE Final   Influenza B by PCR NEGATIVE  NEGATIVE Final    Comment: (NOTE) The Xpert Xpress SARS-CoV-2/FLU/RSV plus assay is intended as an aid in the diagnosis of influenza from Nasopharyngeal swab specimens and should not be used as a sole basis for treatment. Nasal washings and aspirates are unacceptable for Xpert Xpress SARS-CoV-2/FLU/RSV testing.  Fact Sheet for Patients: BloggerCourse.com  Fact Sheet for Healthcare Providers: SeriousBroker.it  This test is not yet approved or cleared by the [place] FDA and has been authorized for detection and/or diagnosis of SARS-CoV-2 by FDA under an Emergency Use Authorization (EUA). This EUA will remain in effect (meaning this test can be used) for the duration of the COVID-19 declaration under Section 564(b)(1) of the Act, 21 U.S.C. section 360bbb-3(b)(1), unless the authorization is terminated or revoked.  Performed at [facility], [address] [place], [place] [address]     RN Pressure  Injury Documentation:     Estimated body mass index is 30.34 kg/m as calculated from the following:   Height as of this encounter:  (1.854 m).   Weight as of this encounter: 104.3 kg.  Malnutrition Type:   Malnutrition Characteristics:   Nutrition Interventions:    Radiology Studies: CT ANGIO HEAD NECK W WO CM  Result Date: 05/31/2020 CLINICAL DATA:  Focal neurological deficit CT, more than 6 hours, stroke suspected. EXAM: CT ANGIOGRAPHY HEAD AND NECK TECHNIQUE: Multidetector CT imaging of the head and neck was performed using the standard protocol during bolus administration of intravenous contrast. Multiplanar CT image reconstructions and MIPs were obtained to evaluate the vascular anatomy. Carotid stenosis measurements (when applicable) are obtained utilizing NASCET criteria, using the distal internal carotid diameter as the denominator. CONTRAST:  50mL OMNIPAQUE IOHEXOL 350 MG/ML SOLN COMPARISON:   MRI of the brain May 30, 2020 FINDINGS: CT HEAD FINDINGS Brain: Multiple hypodense foci are seen along the bilateral ACA and left MCA territory, corresponding to infarct seen on recent MRI of the brain. Multiple additional hypodense foci are seen in the bilateral cerebral hemispheres, corresponding to chronic infarcts. Area as of encephalomalacia and gliosis in the left parietooccipital region and inferior left cerebellar hemisphere. No hemorrhage, hydrocephalus, extra-axial collection or mass lesion. Vascular: No hyperdense vessel. Skull: Normal. Negative for fracture or focal lesion. Sinuses: Mucosal thickening throughout the paranasal sinuses. Orbits: No acute finding. 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 Image portion shows no evidence of aneurysm or dissection. No significant stenosis at the origin of the major arch vessels. Right carotid system: No evidence of dissection, stenosis or occlusion. Left carotid system: No evidence of dissection, stenosis or occlusion. Vertebral arteries: Left dominant. No evidence of dissection, stenosis (50% or greater) or occlusion. Skeleton: Degenerative changes of the cervical spine at C5-6 and C6-7. No acute or aggressive process identified. Other neck: Mildly prominent bilateral cervical lymph nodes, nonspecific. May be reactive. Prominent adenoid tissue. Upper chest: Negative. Review of the MIP images confirms the above findings CTA HEAD FINDINGS Anterior circulation: Intracranial bilateral internal carotid arteries abnormal course and caliber. Bilateral A1 segments are patent noting a hypoplastic right A1/ACA. Interval development of a new focal occlusion versus high-grade stenosis at the mid A2 segment of the left anterior cerebral artery with normal distal opacification. Luminal  irregularity is noted along the bilateral ACA vascular trees. Interval worsening of bilateral multifocal areas of stenosis of the bilateral MCA vascular tree preserving the bilateral M1 segments with severe stenosis at the proximal left M2 segment and persistent occlusion at the distal left M3/MCA superior division branch. Severe stenosis at the proximal right M2/MCA superior division branch and M3/MCA posterior division branches. Posterior circulation: The intracranial bilateral vertebral arteries and basilar artery are maintained. Luminal irregularity is seen along the bilateral posterior cerebral arteries with multifocal areas of mild stenosis. Venous sinuses: Poorly opacified. Review of the MIP images confirms the above findings IMPRESSION: 1. Multiple hypodense foci along the bilateral ACA and left MCA vascular trees correlating with acute/subacute infarcts described on recent MRI. 2. Remote small lacunar infarct in the bilateral cerebral and left cerebellar hemisphere, as described above. 3. No hemodynamically significant stenosis in the major neck arteries. 4. Prominent luminal irregularity of the intracranial vasculature, may represent advanced atherosclerotic disease versus vasculitis. 5. New  occlusion versus high-grade stenosis at the left A2/ACA segment. 6. Multifocal areas of high-grade stenosis the bilateral MCA vascular tree, as described above, with persistent occlusion of the distal left M3/MCA superior division branch and worsening stenosis of right M3/M branches. Electronically Signed   By: Baldemar Lenis M.D.   On: 05/31/2020 18:11   ECHO TEE  Result Date: 06/01/2020    TRANSESOPHOGEAL ECHO REPORT   Patient Name:   NEWELL WAFER Date of Exam: 06/01/2020 Medical Rec #:  [id number]      Height:       73.0 in Accession #:    [id number]     Weight:       230.0 lb Date of Birth:  06/18/1975       BSA:          2.283 m Patient Age:    45 years       BP:           185/111 mmHg Patient  Gender: M              HR:           101 bpm. Exam Location:  Inpatient Procedure: Transesophageal Echo, Color Doppler, Cardiac Doppler and Saline            Contrast Bubble Study Indications:     Stroke  History:         Patient has prior history of Echocardiogram examinations, most                  recent 04/21/2020. Risk Factors:Hypertension.  Sonographer:     Eulah Pont RDCS Referring Phys:  [id number] Manson Passey Diagnosing Phys: Epifanio Lesches MD PROCEDURE: After discussion of the risks and benefits of a TEE, an informed consent was obtained from the patient. The transesophogeal probe was passed without difficulty through the esophogus of the patient. Local oropharyngeal anesthetic was provided with Cetacaine. Sedation performed by different physician. The patient was monitored while under deep sedation. Anesthestetic sedation was provided intravenously by Anesthesiology: 477.63mg  of Propofol. The patient developed Oropharyngeal bleeding during  the procedure but no further bleeding was noted during recovery. IMPRESSIONS  1. Left ventricular ejection fraction, by estimation, is 60 to 65%. The left ventricle has normal function. The left ventricle has no regional wall motion abnormalities. There is severe left ventricular hypertrophy.  2. Right ventricular systolic function is normal. The right ventricular size is normal.  3. No left atrial/left atrial appendage thrombus was detected.  4. The mitral valve is normal in structure. Trivial mitral valve regurgitation.  5. The aortic valve is tricuspid. Aortic valve regurgitation is trivial.  6. Agitated saline contrast bubble study was negative, with no evidence of any interatrial shunt. Conclusion(s)/Recommendation(s): No LA/LAA thrombus identified. Negative bubble study for interatrial shunt. No intracardiac source of embolism detected on this on this transesophageal echocardiogram. FINDINGS  Left Ventricle: Left ventricular ejection fraction, by  estimation, is 60 to 65%. The left ventricle has normal function. The left ventricle has no regional wall motion abnormalities. The left ventricular internal cavity size was normal in size. There is  severe left ventricular hypertrophy. Right Ventricle: The right ventricular size is normal. No increase in right ventricular wall thickness. Right ventricular systolic function is normal. Left Atrium: Left atrial size was normal in size. No left atrial/left atrial appendage thrombus was detected. Right Atrium: Right atrial size was normal in size. Pericardium: There is no evidence of pericardial effusion. Mitral Valve: The mitral valve  is normal in structure. Trivial mitral valve regurgitation. Tricuspid Valve: The tricuspid valve is normal in structure. Tricuspid valve regurgitation is trivial. Aortic Valve: The aortic valve is tricuspid. Aortic valve regurgitation is trivial. Pulmonic Valve: The pulmonic valve was grossly normal. Pulmonic valve regurgitation is not visualized. Aorta: The aortic root is normal in size and structure. IAS/Shunts: No atrial level shunt detected by color flow Doppler. Agitated saline contrast was given intravenously to evaluate for intracardiac shunting. Agitated saline contrast bubble study was negative, with no evidence of any interatrial shunt. Epifanio Lesches MD Electronically signed by Epifanio Lesches MD Signature Date/Time: 06/01/2020/6:21:55 PM    Final    Scheduled Meds: .  stroke: mapping our early stages of recovery book   Does not apply Once  . aspirin  325 mg Oral Daily  . atorvastatin  40 mg Oral Daily  . clopidogrel  75 mg Oral Daily  . enoxaparin (LOVENOX) injection  40 mg Subcutaneous Q24H  . fentaNYL      . heparin sodium (porcine)      . heparin sodium (porcine)      . hydrALAZINE      . insulin aspart  0-20 Units Subcutaneous TID AC & HS  . lidocaine      . midazolam       Continuous Infusions: . sodium chloride 50 mL/hr at 06/02/20 1328  .  sodium chloride 75 mL/hr at 06/02/20 1329    LOS: 3 days   Merlene Laughter, DO [facility] PAGER is on AMION  If 7PM-7AM, please contact night-coverage [email or web address]

## 2020-06-02 NOTE — Procedures (Addendum)
S/P 4 vessel cerebral artrrigram RT CFA approach. Findings. 1.Occluded Lt . MCA  Sup division in mid to distal M2 seg. 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 3. Mod severe focal  stenosis of Lt MCA inf division parietal branch. 4. Focal stenosis of Lt ACA prox pericallosal branches. S.Caron Tardif MD

## 2020-06-02 NOTE — Progress Notes (Signed)
PT Cancellation Note  Patient Details Name: Nathaniel Spears MRN: [id number] DOB: 03-11-1975   Cancelled Treatment:    Reason Eval/Treat Not Completed: Medical issues which prohibited therapy on bedrest after femoral sheath removal. Will follow up as able.    Madelaine Etienne, DPT, PN1   Supplemental Physical Therapist [facility]    Pager [phone] Acute Rehab Office [phone]

## 2020-06-02 NOTE — Sedation Documentation (Signed)
Transported patient with transported assessed right femoral dressing intact no sign of hematoma and pedal pulses +2.

## 2020-06-03 ENCOUNTER — Emergency Department (HOSPITAL_COMMUNITY)
Admission: EM | Admit: 2020-06-03 | Discharge: 2020-06-03 | Disposition: A | Discharge status: Home / Self Care | Payer: Medicaid Other | Attending: Emergency Medicine | Admitting: Emergency Medicine

## 2020-06-03 ENCOUNTER — Other Ambulatory Visit: Payer: Self-pay

## 2020-06-03 ENCOUNTER — Encounter: Payer: Self-pay | Admitting: Occupational Therapy

## 2020-06-03 ENCOUNTER — Emergency Department (HOSPITAL_COMMUNITY): Payer: Medicaid Other

## 2020-06-03 ENCOUNTER — Encounter (HOSPITAL_COMMUNITY): Payer: Self-pay

## 2020-06-03 DIAGNOSIS — Z79899 Other long term (current) drug therapy: Secondary | ICD-10-CM | POA: Insufficient documentation

## 2020-06-03 DIAGNOSIS — Z794 Long term (current) use of insulin: Secondary | ICD-10-CM | POA: Insufficient documentation

## 2020-06-03 DIAGNOSIS — Z7984 Long term (current) use of oral hypoglycemic drugs: Secondary | ICD-10-CM | POA: Insufficient documentation

## 2020-06-03 DIAGNOSIS — Z7982 Long term (current) use of aspirin: Secondary | ICD-10-CM | POA: Insufficient documentation

## 2020-06-03 DIAGNOSIS — R4789 Other speech disturbances: Secondary | ICD-10-CM | POA: Insufficient documentation

## 2020-06-03 DIAGNOSIS — E119 Type 2 diabetes mellitus without complications: Secondary | ICD-10-CM | POA: Insufficient documentation

## 2020-06-03 DIAGNOSIS — F17200 Nicotine dependence, unspecified, uncomplicated: Secondary | ICD-10-CM | POA: Insufficient documentation

## 2020-06-03 DIAGNOSIS — I10 Essential (primary) hypertension: Secondary | ICD-10-CM | POA: Insufficient documentation

## 2020-06-03 DIAGNOSIS — Z7902 Long term (current) use of antithrombotics/antiplatelets: Secondary | ICD-10-CM | POA: Insufficient documentation

## 2020-06-03 HISTORY — DX: Cerebral infarction, unspecified: I63.9

## 2020-06-03 LAB — MAGNESIUM: Magnesium: 1.9 mg/dL (ref 1.7–2.4)

## 2020-06-03 LAB — COMPREHENSIVE METABOLIC PANEL
ALT: 14 U/L (ref 0–44)
AST: 13 U/L — ABNORMAL LOW (ref 15–41)
Albumin: 3.2 g/dL — ABNORMAL LOW (ref 3.5–5.0)
Alkaline Phosphatase: 62 U/L (ref 38–126)
Anion gap: 8 (ref 5–15)
BUN: 16 mg/dL (ref 6–20)
CO2: 24 mmol/L (ref 22–32)
Calcium: 8.8 mg/dL — ABNORMAL LOW (ref 8.9–10.3)
Chloride: 108 mmol/L (ref 98–111)
Creatinine, Ser: 1.47 mg/dL — ABNORMAL HIGH (ref 0.61–1.24)
GFR, Estimated: 60 mL/min — ABNORMAL LOW (ref >60)
Glucose, Bld: 104 mg/dL — ABNORMAL HIGH (ref 70–99)
Potassium: 3.9 mmol/L (ref 3.5–5.1)
Sodium: 140 mmol/L (ref 135–145)
Total Bilirubin: 0.7 mg/dL (ref 0.3–1.2)
Total Protein: 7.2 g/dL (ref 6.5–8.1)

## 2020-06-03 LAB — CBC WITH DIFFERENTIAL/PLATELET
Abs Immature Granulocytes: 0.02 10*3/uL (ref 0.00–0.07)
Basophils Absolute: 0 10*3/uL (ref 0.0–0.1)
Basophils Relative: 0 %
Eosinophils Absolute: 0.1 10*3/uL (ref 0.0–0.5)
Eosinophils Relative: 2 %
HCT: 36.5 % — ABNORMAL LOW (ref 39.0–52.0)
Hemoglobin: 11.9 g/dL — ABNORMAL LOW (ref 13.0–17.0)
Immature Granulocytes: 0 %
Lymphocytes Relative: 29 %
Lymphs Abs: 2 10*3/uL (ref 0.7–4.0)
MCH: 25.6 pg — ABNORMAL LOW (ref 26.0–34.0)
MCHC: 32.6 g/dL (ref 30.0–36.0)
MCV: 78.5 fL — ABNORMAL LOW (ref 80.0–100.0)
Monocytes Absolute: 0.6 10*3/uL (ref 0.1–1.0)
Monocytes Relative: 10 %
Neutro Abs: 4 10*3/uL (ref 1.7–7.7)
Neutrophils Relative %: 59 %
Platelets: 232 10*3/uL (ref 150–400)
RBC: 4.65 MIL/uL (ref 4.22–5.81)
RDW: 12.2 % (ref 11.5–15.5)
WBC: 6.8 10*3/uL (ref 4.0–10.5)
nRBC: 0 % (ref 0.0–0.2)

## 2020-06-03 LAB — VITAMIN B12: Vitamin B-12: 349 pg/mL (ref 180–914)

## 2020-06-03 LAB — CBC
HCT: 38.6 % — ABNORMAL LOW (ref 39.0–52.0)
Hemoglobin: 12.3 g/dL — ABNORMAL LOW (ref 13.0–17.0)
MCH: 25.8 pg — ABNORMAL LOW (ref 26.0–34.0)
MCHC: 31.9 g/dL (ref 30.0–36.0)
MCV: 81.1 fL (ref 80.0–100.0)
Platelets: 239 10*3/uL (ref 150–400)
RBC: 4.76 MIL/uL (ref 4.22–5.81)
RDW: 12.3 % (ref 11.5–15.5)
WBC: 7.5 10*3/uL (ref 4.0–10.5)
nRBC: 0 % (ref 0.0–0.2)

## 2020-06-03 LAB — GLUCOSE, CAPILLARY: Glucose-Capillary: 149 mg/dL — ABNORMAL HIGH (ref 70–99)

## 2020-06-03 LAB — RETICULOCYTES
Immature Retic Fract: 8.5 % (ref 2.3–15.9)
RBC.: 4.71 MIL/uL (ref 4.22–5.81)
Retic Count, Absolute: 36.7 10*3/uL (ref 19.0–186.0)
Retic Ct Pct: 0.8 % (ref 0.4–3.1)

## 2020-06-03 LAB — BASIC METABOLIC PANEL
Anion gap: 9 (ref 5–15)
BUN: 9 mg/dL (ref 6–20)
CO2: 23 mmol/L (ref 22–32)
Calcium: 8.7 mg/dL — ABNORMAL LOW (ref 8.9–10.3)
Chloride: 106 mmol/L (ref 98–111)
Creatinine, Ser: 1.22 mg/dL (ref 0.61–1.24)
GFR, Estimated: >60 mL/min (ref >60)
Glucose, Bld: 98 mg/dL (ref 70–99)
Potassium: 3.8 mmol/L (ref 3.5–5.1)
Sodium: 138 mmol/L (ref 135–145)

## 2020-06-03 LAB — FERRITIN: Ferritin: 302 ng/mL (ref 24–336)

## 2020-06-03 LAB — IRON AND TIBC
Iron: 26 ug/dL — ABNORMAL LOW (ref 45–182)
Saturation Ratios: 9 % — ABNORMAL LOW (ref 17.9–39.5)
TIBC: 301 ug/dL (ref 250–450)
UIBC: 275 ug/dL

## 2020-06-03 LAB — FOLATE: Folate: 19.8 ng/mL (ref >5.9)

## 2020-06-03 IMAGING — CT CT HEAD W/O CM
4 series · 16 of 47 positions shown, 18 images · non-contrast
Comparison: [DATE].

CLINICAL DATA: Transient ischemic attack.

EXAM:
CT HEAD WITHOUT CONTRAST
TECHNIQUE: Contiguous axial images were obtained from the base of the skull
through the vertex without intravenous contrast.

[Series 3: head wo · axial · 0.48mm/px · z∈[-132,-17]mm · 7 of 31 slices shown, 9 images]
[im 4/31  brain]
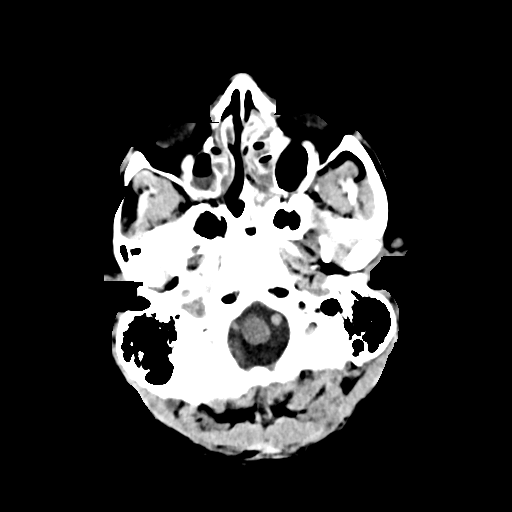
[im 4/31  bone]
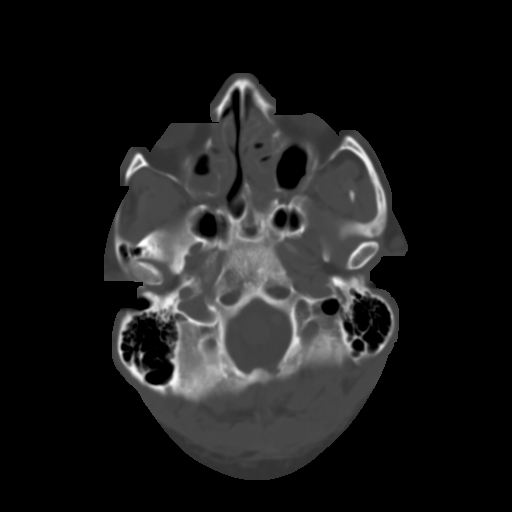
[im 8/31  brain]
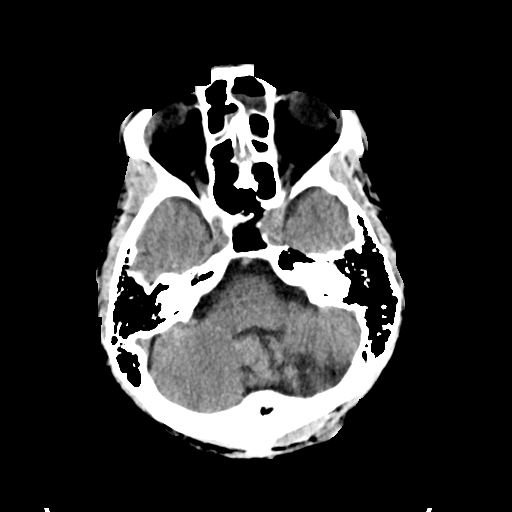
[im 12/31  brain]
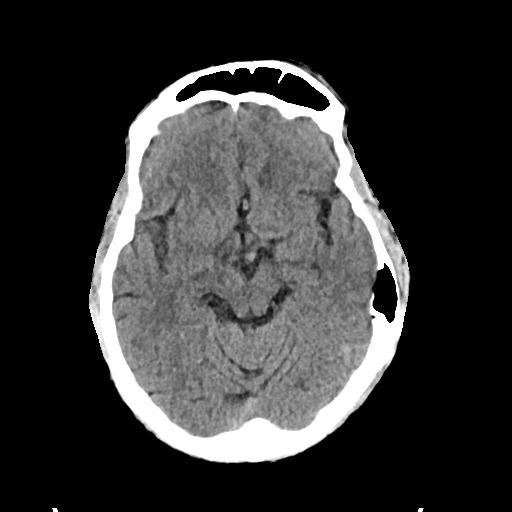
[im 16/31  brain]
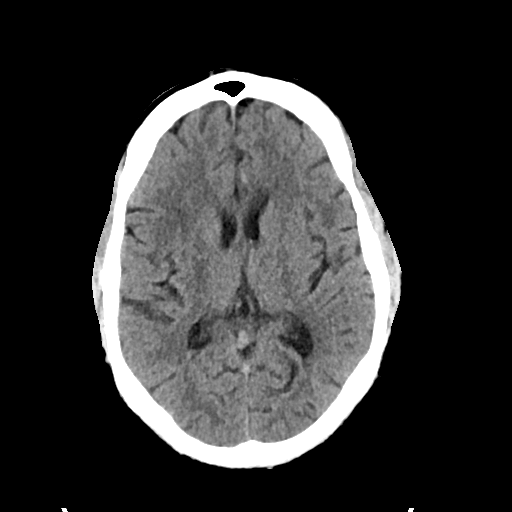
[im 19/31  brain]
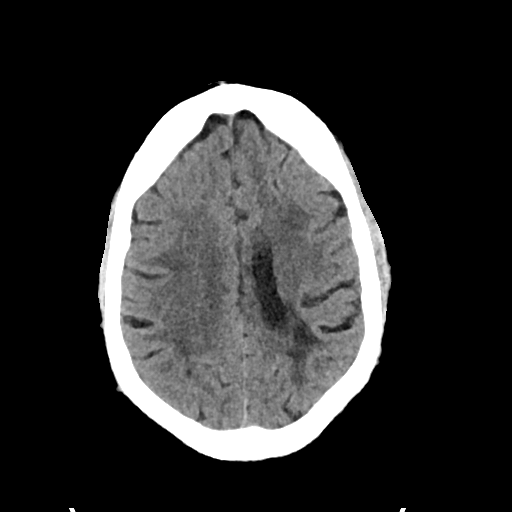
[im 19/31  bone]
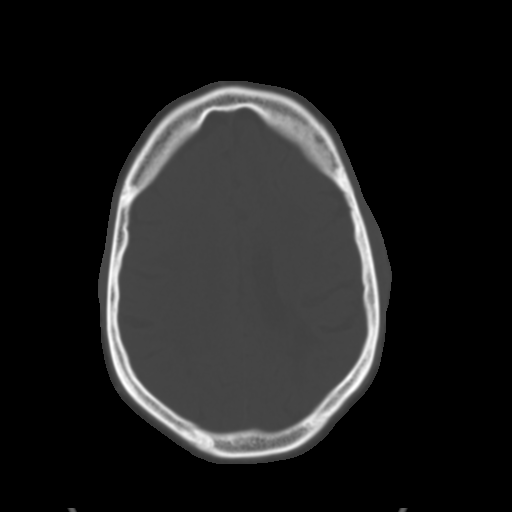
[im 23/31  brain]
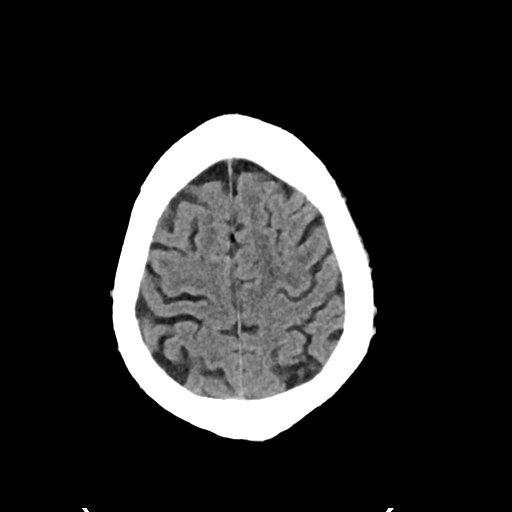
[im 27/31  brain]
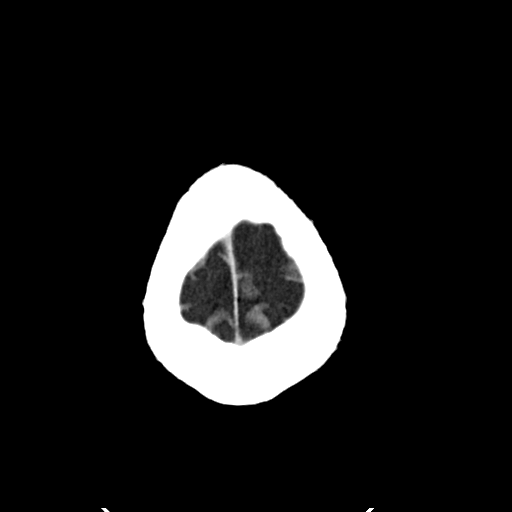

[Series 4: head bone · axial · 0.48mm/px · z∈[-133,-101]mm · 3 of 78 slices shown]
[im 8/78  bone]
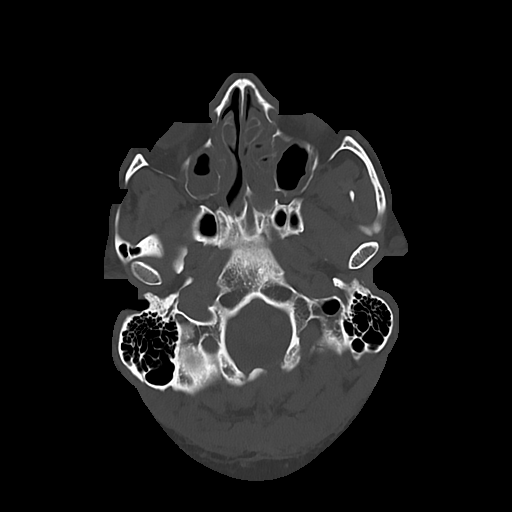
[im 16/78  bone]
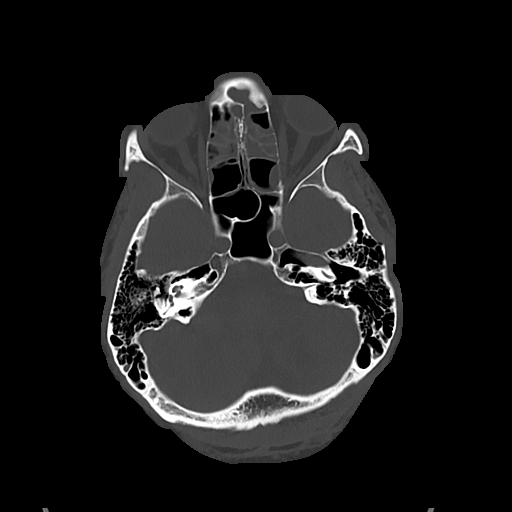
[im 24/78  bone]
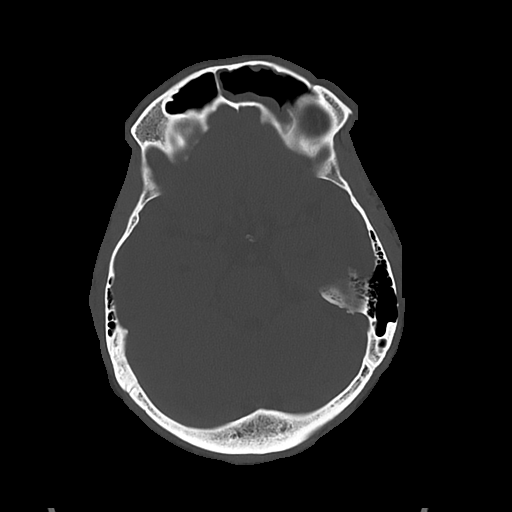

[Series 5: cor soft · coronal · 0.31mm/px · 3 of 78 slices shown]
[im 26/78  brain]
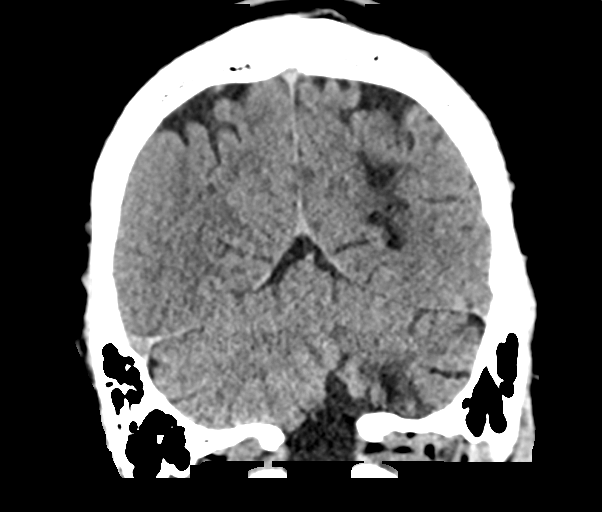
[im 35/78  brain]
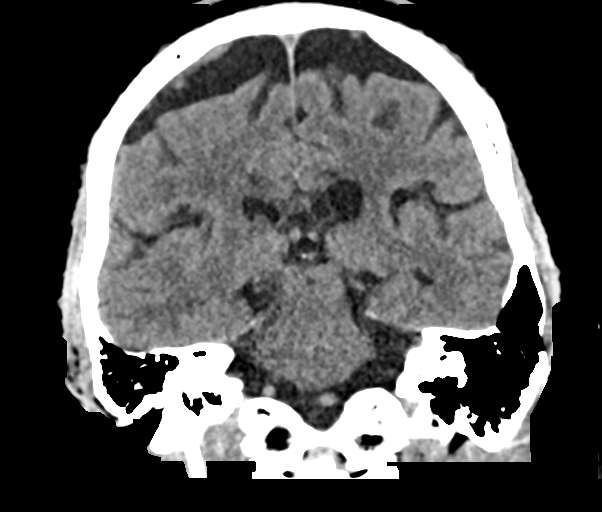
[im 43/78  brain]
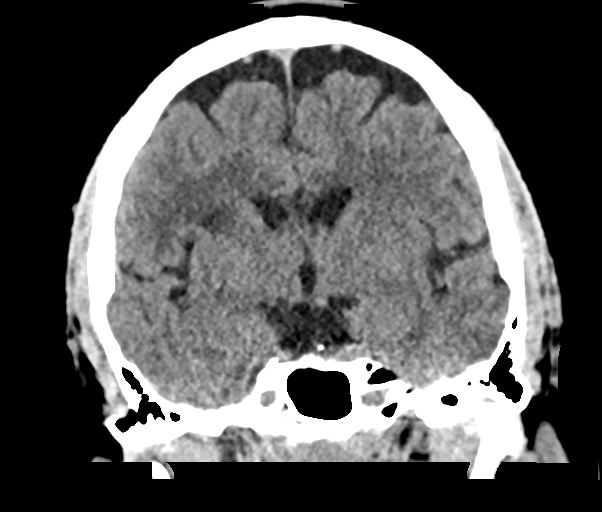

[Series 6: sag soft · sagittal · 0.32mm/px · 3 of 63 slices shown]
[im 21/63  brain]
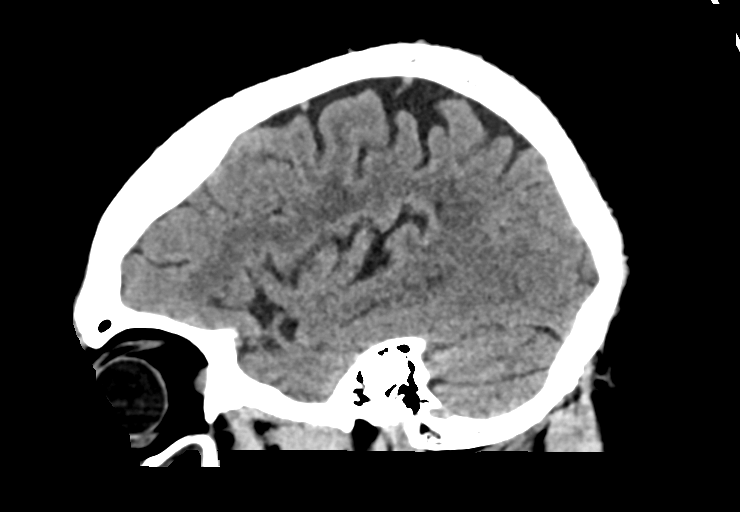
[im 32/63  brain]
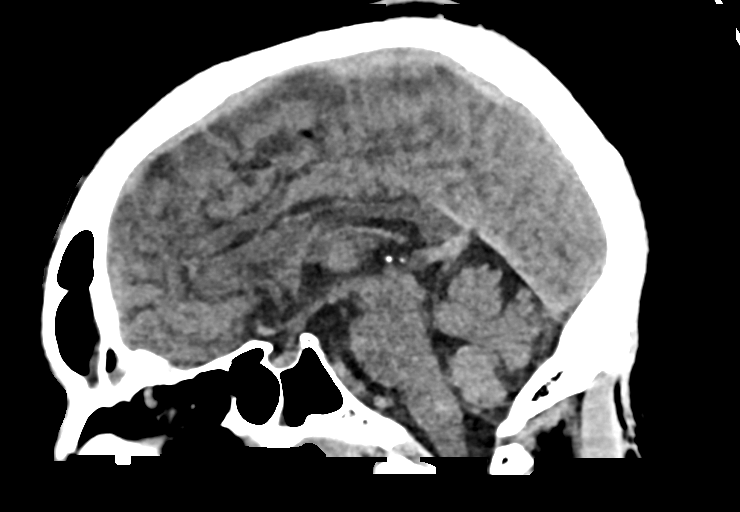
[im 42/63  brain]
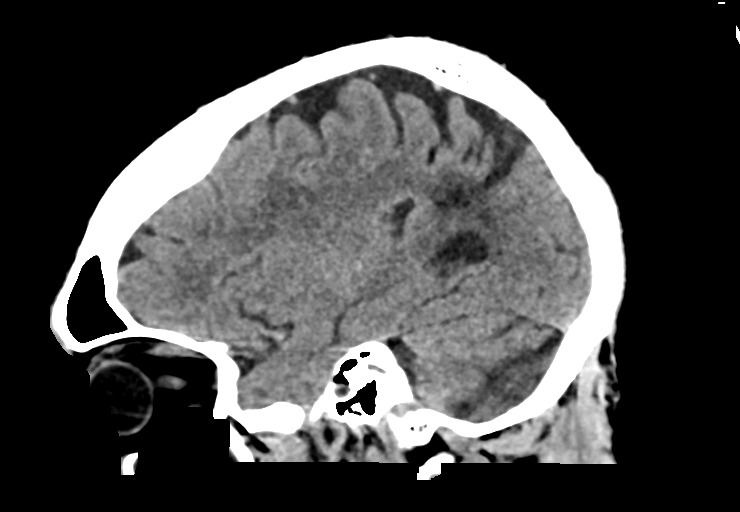

[16 of 47 positions shown; findings below may reference images not displayed]

FINDINGS: Brain: Mild chronic ischemic white matter disease is noted. Old left
posterior parietal infarction is noted. Stable left frontal low
density is noted consistent with acute to subacute infarction as
noted on prior MRI. No hemorrhage is noted. No mass effect or
midline shift is noted. Ventricular size is within normal limits.
Old left cerebellar infarction is noted.

Vascular: No hyperdense vessel or unexpected calcification.

Skull: Normal. Negative for fracture or focal lesion.

Sinuses/Orbits: Bilateral ethmoid and maxillary sinusitis is noted.

Other: None.
IMPRESSION: Stable left frontal low density is noted consistent with acute to
subacute infarction as noted on recent MRI. Old left cerebellar and
posterior parietal infarctions are noted. No significant changes
noted compared to prior exam.

## 2020-06-03 MED ORDER — ASPIRIN 325 MG PO TBEC
325.0000 mg | DELAYED_RELEASE_TABLET | Freq: Every day | ORAL | 2 refills | Status: DC
  Filled 2020-06-03: qty 30, 30d supply, fill #0

## 2020-06-03 MED ORDER — LEVETIRACETAM 500 MG PO TABS
500.0000 mg | ORAL_TABLET | Freq: Two times a day (BID) | ORAL | 0 refills | Status: DC
  Filled 2020-06-03: qty 60, 30d supply, fill #0

## 2020-06-03 MED ORDER — ACETAMINOPHEN 325 MG PO TABS
650.0000 mg | ORAL_TABLET | ORAL | 0 refills | Status: DC | PRN
  Filled 2020-06-03: qty 20, 2d supply, fill #0

## 2020-06-03 MED ORDER — LEVETIRACETAM IN NACL 1000 MG/100ML IV SOLN
1000.0000 mg | Freq: Once | INTRAVENOUS | Status: AC
  Administered 2020-06-03: 1000 mg via INTRAVENOUS
  Filled 2020-06-03: qty 100

## 2020-06-03 NOTE — Progress Notes (Signed)
Patient discharging home. Vital signs stable at time of discharge as reflected in discharge summary. Discharge instructions given and verbal understanding returned. No questions at this time. Patient to follow up with PCP within 2 weeks.

## 2020-06-03 NOTE — ED Provider Notes (Signed)
[place] [place] EMERGENCY DEPARTMENT Provider Note   CSN: [id number] Arrival date & time: 06/03/20  1524     History Chief Complaint  Patient presents with  . Stroke Symptoms    Nathaniel Spears is a 45 y.o. male with recent diagnosis of recurrent CVA presents the emerge department today for evaluation of paucity of speech.  Per chart review patient was seen in April for CVA a month ago in the setting of multiple uncontrolled stroke risk factors and multifocal intracranial atherosclerosis.  His past medical history includes hypertension, substance abuse, diabetes and obesity.  He presented to the emergency department last week for worsening left lower extremity weakness dizziness and falling and ongoing aphasia, was found to have a new stroke in the bilateral MCA and ACA territories.  He was discharged today, EMS states that around 2 PM his family members noted that he was acting differently.  They state that he would not respond to them and he would just gaze to the right side, and not follow commands.  At the time EMS came that his symptoms had completely resolved.  Was able to speak to girlfriend who was someone driving.  She states that he was discharged around 2, they were on their way home when they were try to get out of the car, girlfriend states that she was asking him questions and noted that he was not answering, she looked over to him and he was unable to answer any of her questions or move or get out of the car.  States that this lasted a couple seconds, and then when EMS came it resolved.  She did not mention anything about gaze, states that he is actually looking straight ahead, and when she got to the passenger side of the car he looked directly at her and was able to move his head but was still verbally unresponsive.  Patient currently denies any symptoms, no chest pain or shortness of breath, no weakness or neglect that is new.  No vision changes or headache.  States that  he feels fine now.  HPI     Past Medical History:  Diagnosis Date  . Hypertension   . Stroke [facility])     Patient Active Problem List   Diagnosis Date Noted  . CVA (cerebral vascular accident) ([place]) 04/21/2020  . Aphasia   . Polysubstance abuse ([place])   . Type 2 diabetes mellitus without complication ([place])   . Hypokalemia   . Acute CVA (cerebrovascular accident) ([place]) 04/20/2020  . Essential hypertension 08/21/2019  . Obesity 08/21/2019    Past Surgical History:  Procedure Laterality Date  . BUBBLE STUDY  06/01/2020   Procedure: BUBBLE STUDY;  Surgeon: Donato Heinz, MD;  Location: [place];  Service: Cardiovascular;;  . TEE WITHOUT CARDIOVERSION N/A 06/01/2020   Procedure: TRANSESOPHAGEAL ECHOCARDIOGRAM (TEE);  Surgeon: Donato Heinz, MD;  Location: [facility] ENDOSCOPY;  Service: Cardiovascular;  Laterality: N/A;       History reviewed. No pertinent family history.  Social History   Tobacco Use  . Smoking status: Current Every Day Smoker  . Smokeless tobacco: Never Used  Substance Use Topics  . Alcohol use: Not Currently  . Drug use: Never    Home Medications Prior to Admission medications   Medication Sig Start Date End Date Taking? Authorizing Provider  levETIRAcetam (KEPPRA) 500 MG tablet Take 1 tablet (500 mg total) by mouth 2 (two) times daily. 06/03/20 07/03/20 Yes Alfredia Client, PA-C  acetaminophen (TYLENOL) 325 MG tablet  Take 2 tablets (650 mg total) by mouth every 4 (four) hours as needed for mild pain (or temp > 37.5 C (99.5 F)). 06/03/20   Sheikh, Omair Latif, DO  amLODipine (NORVASC) 10 MG tablet TAKE 1 TABLET BY MOUTH DAILY. 08/21/19 08/20/20  Kerin Perna, NP  aspirin 325 MG EC tablet Take 1 tablet (325 mg total) by mouth daily. 06/03/20   Raiford Noble Latif, DO  atorvastatin (LIPITOR) 40 MG tablet Take 1 tablet (40 mg total) by mouth daily. 04/24/20 05/27/20  Arrien, Jimmy Picket, MD  atorvastatin (LIPITOR) 40 MG tablet Take 40 mg by  mouth daily.    [provider]  blood glucose meter kit and supplies Dispense based on patient and insurance preference. Use up to four times daily as directed. (FOR ICD-10 E10.9, E11.9). 04/23/20   Arrien, Jimmy Picket, MD  Blood Glucose Monitoring Suppl (TRUE METRIX METER) w/Device KIT Use to check blood sugar TID. 05/18/20   Charlott Rakes, MD  clopidogrel (PLAVIX) 75 MG tablet Take 1 tablet (75 mg total) by mouth daily. 05/24/20 06/23/20  Kerin Perna, NP  glucose blood (TRUE METRIX BLOOD GLUCOSE TEST) test strip Use to check blood sugar TID. 05/18/20   Charlott Rakes, MD  Insulin Glargine (BASAGLAR KWIKPEN) 100 UNIT/ML Inject 10 Units into the skin daily. 05/18/20   Kerin Perna, NP  losartan-hydrochlorothiazide (HYZAAR) 100-25 MG tablet Take 1 tablet by mouth daily.    [provider]  metFORMIN (GLUCOPHAGE) 1000 MG tablet Take 1 tablet (1,000 mg total) by mouth 2 (two) times daily with a meal. 05/18/20   Kerin Perna, NP  TRUEplus Lancets 28G MISC Use to check blood sugar 3 times daily. Patient taking differently: Use to check blood sugar 3 times daily. 05/23/20   Kerin Perna, NP    Allergies    Patient has no known allergies.  Review of Systems   Review of Systems  Constitutional: Negative for chills, diaphoresis, fatigue and fever.  HENT: Negative for congestion, sore throat and trouble swallowing.   Eyes: Negative for pain and visual disturbance.  Respiratory: Negative for cough, shortness of breath and wheezing.   Cardiovascular: Negative for chest pain, palpitations and leg swelling.  Gastrointestinal: Negative for abdominal distention, abdominal pain, diarrhea, nausea and vomiting.  Genitourinary: Negative for difficulty urinating.  Musculoskeletal: Negative for back pain, neck pain and neck stiffness.  Skin: Negative for pallor.  Neurological: Positive for speech difficulty. Negative for dizziness, weakness and headaches.   Psychiatric/Behavioral: Negative for confusion.    Physical Exam Updated Vital Signs BP (!) 152/103   Pulse 83   Temp 98.3 F (36.8 C) (Oral)   Resp (!) 21   SpO2 100%   Physical Exam Constitutional:      General: He is not in acute distress.    Appearance: Normal appearance. He is not ill-appearing, toxic-appearing or diaphoretic.  HENT:     Mouth/Throat:     Mouth: Mucous membranes are moist.     Pharynx: Oropharynx is clear.  Eyes:     General: No scleral icterus.    Extraocular Movements: Extraocular movements intact.     Pupils: Pupils are equal, round, and reactive to light.  Cardiovascular:     Rate and Rhythm: Normal rate and regular rhythm.     Pulses: Normal pulses.     Heart sounds: Normal heart sounds.  Pulmonary:     Effort: Pulmonary effort is normal. No respiratory distress.     Breath sounds: Normal  breath sounds. No stridor. No wheezing, rhonchi or rales.  Chest:     Chest wall: No tenderness.  Abdominal:     General: Abdomen is flat. There is no distension.     Palpations: Abdomen is soft.     Tenderness: There is no abdominal tenderness. There is no guarding or rebound.  Musculoskeletal:        General: No swelling or tenderness. Normal range of motion.     Cervical back: Normal range of motion and neck supple. No rigidity.     Right lower leg: No edema.     Left lower leg: No edema.  Skin:    General: Skin is warm and dry.     Capillary Refill: Capillary refill takes less than 2 seconds.     Coloration: Skin is not pale.  Neurological:     General: No focal deficit present.     Mental Status: He is alert and oriented to person, place, and time.     Comments: Alert.  Speech is clear, however mildly aphasic at times with paucity of speech. No facial droop. CNIII-XII grossly intact. Bilateral upper and lower extremities' sensation grossly intact. 5/5 symmetric strength with grip strength and with plantar and dorsi flexion bilaterally. Normal finger  to nose bilaterally. Negative pronator drift.  Gait normal.   Psychiatric:        Mood and Affect: Mood normal.        Behavior: Behavior normal.     ED Results / Procedures / Treatments   Labs (all labs ordered are listed, but only abnormal results are displayed) Labs Reviewed  CBC - Abnormal; Notable for the following components:      Result Value   Hemoglobin 12.3 (*)    HCT 38.6 (*)    MCH 25.8 (*)    All other components within normal limits  COMPREHENSIVE METABOLIC PANEL - Abnormal; Notable for the following components:   Glucose, Bld 104 (*)    Creatinine, Ser 1.47 (*)    Calcium 8.8 (*)    Albumin 3.2 (*)    AST 13 (*)    GFR, Estimated 60 (*)    All other components within normal limits    EKG None  Radiology CT HEAD WO CONTRAST  Result Date: 06/03/2020 CLINICAL DATA:  Transient ischemic attack. EXAM: CT HEAD WITHOUT CONTRAST TECHNIQUE: Contiguous axial images were obtained from the base of the skull through the vertex without intravenous contrast. COMPARISON:  May 30, 2020. FINDINGS: Brain: Mild chronic ischemic white matter disease is noted. Old left posterior parietal infarction is noted. Stable left frontal low density is noted consistent with acute to subacute infarction as noted on prior MRI. No hemorrhage is noted. No mass effect or midline shift is noted. Ventricular size is within normal limits. Old left cerebellar infarction is noted. Vascular: No hyperdense vessel or unexpected calcification. Skull: Normal. Negative for fracture or focal lesion. Sinuses/Orbits: Bilateral ethmoid and maxillary sinusitis is noted. Other: None. IMPRESSION: Stable left frontal low density is noted consistent with acute to subacute infarction as noted on recent MRI. Old left cerebellar and posterior parietal infarctions are noted. No significant changes noted compared to prior exam. Electronically Signed   By: Marijo Conception M.D.   On: 06/03/2020 16:16    Procedures Procedures    Medications Ordered in ED Medications  levETIRAcetam (KEPPRA) IVPB 1000 mg/100 mL premix (has no administration in time range)    ED Course  I have reviewed the triage vital  signs and the nursing notes.  Pertinent labs & imaging results that were available during my care of the patient were reviewed by me and considered in my medical decision making (see chart for details).    MDM Rules/Calculators/A&P                          Nathaniel Spears is a 45 y.o. male with recent diagnosis of recurrent CVA presents the emerge department today for evaluation of paucity of speech. Appears as if this was extremely brief, patient is back to baseline at this time.  No new neurodeficits noted on exam.  Patient states that he is completely asymptomatic now, nontoxic-appearing.  Continues to have aphasia with paucity of speech, otherwise no abnormalities.  EKG without any acute changes, CT head without any acute intra cranial abnormality beside stroke seen for his ago.  CBC and CMP unremarkable.  Spoke to Dr. Malen Gauze, neurology who does not recommend any further imaging.  Does recommend loading dose of Keppra at this time for seizure prophylaxis and starting seizure medications 500 Keppra twice daily.  Does not recommend any other medication management, states that this is most likely due to his prior stroke, does have history of paucity due to prior stroke and this is most likely normal for patient.  Patient can be discharged at this time with prior neurology recommendations.  Patient and girlfriend agreeable with plan.  Doubt need for further emergent work up at this time. I explained the diagnosis and have given explicit precautions to return to the ER including for any other new or worsening symptoms. The patient understands and accepts the medical plan as it's been dictated and I have answered their questions. Discharge instructions concerning home care and prescriptions have been given. The patient is  STABLE and is discharged to home in good condition  I discussed this case with my attending physician who cosigned this note including patient's presenting symptoms, physical exam, and planned diagnostics and interventions. Attending physician stated agreement with plan or made changes to plan which were implemented.   Final Clinical Impression(s) / ED Diagnoses Final diagnoses:  Other speech disturbance    Rx / DC Orders ED Discharge Orders         Ordered    levETIRAcetam (KEPPRA) 500 MG tablet  2 times daily        06/03/20 1743           Alfredia Client, PA-C 06/03/20 1833    Arnaldo Natal, MD 06/03/20 2200

## 2020-06-03 NOTE — ED Triage Notes (Signed)
Pt BIB [facility] from home c/o a possible TIA. Pt was just discharged at 2pm from inpatient for a stroke. Pt has right sided weakness and sensory perception due to stroke. When family got home with pt and tried to get pt out of the car, pt was staring to the right and would not answer any questions so they called EMS. By time EMS arrived symptoms had resolved and pt was able to get out of the car.

## 2020-06-03 NOTE — Progress Notes (Signed)
Inpatient Diabetes Program Recommendations  AACE/ADA: New Consensus Statement on Inpatient Glycemic Control (2015)  Target Ranges:  Prepandial:   less than 140 mg/dL      Peak postprandial:   less than 180 mg/dL (1-2 hours)      Critically ill patients:  140 - 180 mg/dL   Lab Results  Component Value Date   GLUCAP 97 06/02/2020   HGBA1C 10.3 (H) 05/31/2020    Review of Glycemic Control Results for Nathaniel Spears, Nathaniel Spears (MRN [id number]) as of 06/03/2020 07:50  Ref. Range 06/01/2020 21:03 06/02/2020 08:08 06/02/2020 13:23 06/02/2020 19:57 06/02/2020 23:44  Glucose-Capillary Latest Ref Range: 70 - 99 mg/dL 91 389 (H) 89 373 (H) 97   Diabetes history: DM 2 Outpatient Diabetes medications: Basaglar 10 units Daily, Metformin 1000 mg bid Current orders for Inpatient glycemic control:  Novolog 0-20 units tid and hs  Inpatient Diabetes Program Recommendations:     Consult for elevated A1c and recent strokes. Pt has been in the hospital frequently recently. DM Coordinator spoke with pt and girlfriend at length last admission and showed them how to use the insulin pen. Pt was referred to outpatient education at that time as well. See note from 4/6.  Thanks,  Christena Deem RN, MSN, BC-ADM Inpatient Diabetes Coordinator Team Pager [phone] (8a-5p)

## 2020-06-03 NOTE — Progress Notes (Signed)
This chaplain responded to family member's request by phone for Pt. prayer. The Pt. girlfriend-[name] is at the bedside and the Pt. is accepting of the chaplain's invitation for prayer.   The chaplain listened as Nathaniel Spears shared the Pt. events of the day and the uncertainty in the unanswered questions.  The chaplain is available for F/U spiritual care as needed.

## 2020-06-03 NOTE — Progress Notes (Signed)
Physical Therapy Treatment Patient Details Name: Nathaniel Spears MRN: [id number] DOB: 03/18/1975 Today's Date: 06/03/2020    History of Present Illness Mr. Karel is a 45 yo male who presented with worsening left lower extremity weakness, dizziness, and ongoing aphasia. MRI shows new acute infarcts involving the cortical and subcortical frontal lobes bilaterally . Recently hospitalized 4/5-4/8 after having acute L middle frontal lobe CVA. s/p angio on 5/18, showing multifocal athero at left MCA and b/l ACAs. PMH: CVA, HTN, substance use (cocaine positive last admission), DMII, obesity.    PT Comments    Pt demonstrating improved tolerance for activity today, ambulating great hallway distance and navigating steps without PT physical assist. Pt does show mild imbalance and difficulty when challenge is introduced, I.e. cognitive tasks like pathfinding and step over objects. PT explained the importance of pt supervision at d/c given deficits, Pt is aware of deficits as are family. Pt continues to demonstrate expressive difficulty, but pt and family reports it is improving. PT to continue to follow acutely.     Follow Up Recommendations  Outpatient PT;Supervision/Assistance - 24 hour (neuro outpatient PT)     Equipment Recommendations  None recommended by PT    Recommendations for Other Services       Precautions / Restrictions Precautions Precautions: Fall Precaution Comments: expressive aphasia Restrictions Weight Bearing Restrictions: No    Mobility  Bed Mobility Overal bed mobility: Modified Independent                  Transfers Overall transfer level: Modified independent               General transfer comment: increased time to rise and steady, no physical assist needed  Ambulation/Gait Ambulation/Gait assistance: Supervision Gait Distance (Feet): 200 Feet Assistive device: None Gait Pattern/deviations: Step-through pattern;Decreased stride length;Trunk  flexed Gait velocity: decr   General Gait Details: supervsion for safety, verbal cuing for hallway navigation and finding his way back to room. slowed gait when challenged (step over object, directional changes, step navigation)   Stairs Stairs: Yes Stairs assistance: Min guard Stair Management: One rail Right;Alternating pattern;Step to pattern;Forwards Number of Stairs: 12 General stair comments: min guard for safety, verbal cuing for step-to gait as needed which pt utilized descending, reiterated pt will need family/girlfriend with him when performing step navigation at home   Wheelchair Mobility    Modified Rankin (Stroke Patients Only) Modified Rankin (Stroke Patients Only) Pre-Morbid Rankin Score: Moderate disability Modified Rankin: Moderately severe disability     Balance Overall balance assessment: Needs assistance;History of Falls Sitting-balance support: No upper extremity supported;Feet supported Sitting balance-Leahy Scale: Good     Standing balance support: No upper extremity supported;During functional activity Standing balance-Leahy Scale: Good               High level balance activites: Direction changes;Other (comment) High Level Balance Comments: mild impairment (slowed speed, mild unsteadiness corrected by self): directional changes, looking superiorly, stairs, step over object            Cognition Arousal/Alertness: Awake/alert Behavior During Therapy: WFL for tasks assessed/performed;Flat affect Overall Cognitive Status: Impaired/Different from baseline Area of Impairment: Problem solving;Safety/judgement                         Safety/Judgement: Decreased awareness of safety   Problem Solving: Slow processing;Requires verbal cues;Difficulty sequencing General Comments: pt demonstrates pathfinding difficulty, gets lost going back to room. Pt with expressive difficulty today, gets frustated and benefits  from being given choices of  answers.      Exercises      General Comments        Pertinent Vitals/Pain Pain Assessment: No/denies pain    Home Living                      Prior Function            PT Goals (current goals can now be found in the care plan section) Acute Rehab PT Goals Patient Stated Goal: to be able to walk again, get back to normal functioning PT Goal Formulation: With patient Time For Goal Achievement: 06/14/20 Potential to Achieve Goals: Good Progress towards PT goals: Progressing toward goals    Frequency    Min 4X/week      PT Plan Current plan remains appropriate    Co-evaluation              AM-PAC PT "6 Clicks" Mobility   Outcome Measure  Help needed turning from your back to your side while in a flat bed without using bedrails?: None Help needed moving from lying on your back to sitting on the side of a flat bed without using bedrails?: None Help needed moving to and from a bed to a chair (including a wheelchair)?: A Little Help needed standing up from a chair using your arms (e.g., wheelchair or bedside chair)?: A Little Help needed to walk in hospital room?: A Little Help needed climbing 3-5 steps with a railing? : A Little 6 Click Score: 20    End of Session Equipment Utilized During Treatment: Gait belt Activity Tolerance: Patient tolerated treatment well Patient left: with call bell/phone within reach;in bed;with bed alarm set;with family/visitor present Nurse Communication: Mobility status PT Visit Diagnosis: Unsteadiness on feet (R26.81);Muscle weakness (generalized) (M62.81);Difficulty in walking, not elsewhere classified (R26.2);Other symptoms and signs involving the nervous system (R29.898);History of falling (Z91.81)     Time: 4098-1191 PT Time Calculation (min) (ACUTE ONLY): 23 min  Charges:  $Gait Training: 23-37 mins                     Marye Round, PT DPT Acute Rehabilitation Services Pager [phone]  Office  [phone]    Rocio Wolak E Christain [place] 06/03/2020, 11:13 AM

## 2020-06-03 NOTE — Discharge Summary (Addendum)
Physician Discharge Summary  Nathaniel Spears VVO:[id number] DOB: 04/05/1975 DOA: 05/30/2020  PCP: Kerin Perna, NP  Admit date: 05/30/2020 Discharge date: 06/03/2020  Admitted From: Home Disposition: Home  Recommendations for Outpatient Follow-up:  1. Follow up with PCP in 1-2 weeks 2. Follow up with Neurology in the outpatient setting  3. Please obtain CMP/CBC, Mag, Phos in one week 4. Please follow up on the following pending results:  Home Health: No; Referral for Outpatient PT/OT/SLP made Equipment/Devices: None   Discharge Condition: Stable CODE STATUS: FULL CODE Diet recommendation: Heart Healthy Carb Modified Diet  Brief/Interim Summary: The patient is an obese African-American male with a past medical history significant for but not limited to hypertension, cocaine abuse, diabetes mellitus type 2, obesity as well as a recent CVA who was just discharged on 04/23/2020 who presented with worsening left lower extremity weakness, dizziness, falling and ongoing aphasia.  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  He was hospitalized for further management and seen by neurology.  He underwent TEE on 06/01/2020 which showed no cardiac source of embolism seen and negative bubble study.  Today on 06/02/2020 he underwent a cerebral arteriogram which showed multiple vessel arthrosclerosis but no evidence of vasculitis.  Neurology recommends continuing lifestyle modification as well as compliance with medications.  He is now on aspirin 325 daily as well as Plavix 75 mg daily as well as a statin.  Neurology recommended observing 1 more night and since he was stable with no Issues recommending D/C home on DAPT for 3 months with ASA 325 mg po Daily and Plavix 75 mg po Daily and then Plavix Monotherapy. He will need to follow up with PCP   Discharge  Diagnoses:  Principal Problem:   Acute CVA (cerebrovascular accident) [facility]) Active Problems:   Essential hypertension   Obesity   Type 2 diabetes mellitus without complication ([place])  Acute CVA -Recently hospitalized last month with acute CVA at that time as well -Admission Head CT Scan showed "Stable appearance of chronic LEFT infarcts. Periventricular white matter changes are stable. No evidence for acute intracranial abnormality. Chronic sinus changes." -CTA Head and Neck showed "Multiple hypodense foci along the bilateral ACA and left MCA vascular trees correlating with acute/subacute infarcts described on recent MRI. Remote small lacunar infarct in the bilateral cerebral and left cerebellar hemisphere, as described above. No hemodynamically significant stenosis in the major neck arteries. Prominent luminal irregularity of the intracranial vasculature, may represent advanced atherosclerotic disease versus vasculitis. New occlusion versus high-grade stenosis at the left A2/ACA segment. Multifocal areas of high-grade stenosis the bilateral MCA vascular tree, as described above, with persistent occlusion of the distal left M3/MCA superior division branch and worsening stenosis of right M3/M branches" -Repeat brain MRI done this admission showed "Redemonstrated patchy cortical/subcortical infarcts within the mid left frontal lobe/frontal operculum, now subacute. New from the prior brain MRI of 04/20/2020, there are small acute infarcts within the cortical and subcortical frontal lobes bilaterally, and within the left callosal body/genu (bilateral MCA and ACA territories). The largest acute infarct is present within the left callosal body/genu, measuring 2 cm. 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 Stable background severe cerebral white matter chronic small vessel ischemic disease. Stable mild generalized  parenchymal atrophy.Paranasal sinus disease, as described." -  Patient underwent a TTE which showed no cardiac source of emboli and there is no PFO given a normal bubble study -Patient underwent a cerebral angiogram which showed "Occluded Lt. MCA Sup division in mid to distal M2 seg. Severe tandem stenosis of RTACA distal A2 and prox A3.  Mod severe focal stenosis of Lt MCA inf division parietal branch. Focal stenosis of Lt ACA prox pericallosal branches." -Currently not a LP candidate given that he is on Plavix and neurology recommending outpatient LP if necessary -[name] cath site looks clean and he is stable to be discharged -Neurology recommending continuing aspirin 325 mg p.o. daily as well as Plavix 75 mg p.o. daily for 3 months and then recommending monotherapy with just Plavix alone -Recently had a CVA in 04/2020 with aphasia, intermittent headache and dizziness.  At that time he was discharged on dual antiplatelet therapy and Lipitor 40 mg which will be continued at discharge -Hemoglobin A1c is 10.3 -Lipid panel done this admission showed a total cholesterol/HDL ratio 3.1, cholesterol level 74, HDL of 24, LDL 39, triglycerides of 55, VLDL of 11 -Neurology recommends medication compliance as well as lifestyle changes -PT/OT and SLP recommending outpatient Therapies  and he stable be discharged  Multifocal intracranial stenosis -Per neurology -He has severe atherectomized disease involving the distal MCA branches bilaterally with associated moderate to severe multifocal bilateral M2 M3 stenosis -Neurology recommends dual antiplatelet therapy with aspirin 325 mg daily as well as clopidogrel 75 mg p.o. daily for 3 months and then just Plavix alone monotherapy  Hypokalemia -[name] K+ was 3.8 -Mag Level was 1.9 -Continue to Monitor and Replete as Necessary -Repeat CMP in the AM  Hypomagnesemia -[name] Mag Level was 1.6 -Replete with IV Mag Sulfate 2 grams -Continue to Monitor  and Replete as Necessary  -Repeat Mag Level in the AM   Hypertension -Neurology recommending avoiding low blood pressure and recommending keeping a blood pressure goal systolic between 208 and 022 given his severe intracranial stenosis to prevent hypoperfusion -C/w Permissive HTN  -Current blood pressure reading prior to discharge was 143/100 -Continue with labetalol 10 mg IV every 4 as needed for systolic blood pressure greater than 336 or diastolic blood pressure greater than 120 and continue with hydralazine 10 mg IV every 4 as needed second if unable to give labetalol for systolic blood pressure greater than 122 or diastolic blood pressure below 120 -Continue to monitor blood pressures per protocol and follow-up with PCP  Tobacco Abuse -Smoking cessation counseling given  Cocaine Abuse -Tested positive for cocaine on UDS last admission -Continue monitor and counseling was given  Uncontrolled Diabetes Mellitus Type 2 -Recent hemoglobin A1c was 10.3 -Goal is less than 7.0 continue carb modified diet -Continue with resistant NovoLog/scale insulin AC; Lantus currently on hold but will resume at discharge -We will consult diabetes education coordinator for further assistance -CBGs ranging from 89-149 and reasonably well controlled   Microcytic Anemia -Relatively stable. [name] Hgb/Hct went from 11.4/35.1 -> 11.8/79.8 -> 12.2/37.6 and today it is 11.9/36.5 -Checked Anemia Panel iron level of 26, U IBC 275, TIBC 3 1, saturation ratios 9%, ferritin level 302, folate of 19.8, vitamin B12 level 329 -Continue to Monitor for S/Sx of Bleeding; Currently no overt bleeding noted -Repeat CBC in the AM   Obesity -Complicates overall prognosis and care -Estimated body mass index is 30.34 kg/m as calculated from the following:   Height as of this encounter: _0  (1.854 m).   Weight as of this encounter: 104.3 kg. -  Weight Loss and Dietary Counseling given  Discharge  Instructions  Discharge Instructions    Ambulatory referral to Neurology   Complete by: As directed    Follow up with Dr. Leonie Man at [facility] in 4 weeks. Too complicated for NP to follow. Thanks.   Ambulatory referral to Occupational Therapy   Complete by: As directed    Ambulatory referral to Physical Therapy   Complete by: As directed    Ambulatory referral to Speech Therapy   Complete by: As directed    Call MD for:  difficulty breathing, headache or visual disturbances   Complete by: As directed    Call MD for:  extreme fatigue   Complete by: As directed    Call MD for:  hives   Complete by: As directed    Call MD for:  persistant dizziness or light-headedness   Complete by: As directed    Call MD for:  persistant nausea and vomiting   Complete by: As directed    Call MD for:  redness, tenderness, or signs of infection (pain, swelling, redness, odor or green/yellow discharge around incision site)   Complete by: As directed    Call MD for:  severe uncontrolled pain   Complete by: As directed    Call MD for:  temperature >100.4   Complete by: As directed    Diet - low sodium heart healthy   Complete by: As directed    Discharge instructions   Complete by: As directed    You were cared for by a hospitalist during your hospital stay. If you have any questions about your discharge medications or the care you received while you were in the hospital after you are discharged, you can call the unit and ask to speak with the hospitalist on call if the hospitalist that took care of you is not available. Once you are discharged, your primary care physician will handle any further medical issues. Please note that NO REFILLS for any discharge medications will be authorized once you are discharged, as it is imperative that you return to your primary care physician (or establish a relationship with a primary care physician if you do not have one) for your aftercare needs so that they can reassess your  need for medications and monitor your lab values.  Follow up with PCP and Neurology as an outpatient within 1-2 weeks. Take all medications as prescribed. If symptoms change or worsen please return to the ED for evaluation   Discharge wound care:   Complete by: As directed    Redress with Transparent Dressing.   Increase activity slowly   Complete by: As directed      Allergies as of 06/03/2020   No Known Allergies     Medication List    TAKE these medications   acetaminophen 325 MG tablet Commonly known as: TYLENOL Take 2 tablets (650 mg total) by mouth every 4 (four) hours as needed for mild pain (or temp > 37.5 C (99.5 F)).   amLODipine 10 MG tablet Commonly known as: NORVASC TAKE 1 TABLET BY MOUTH DAILY.   aspirin 325 MG EC tablet Take 1 tablet (325 mg total) by mouth daily.   atorvastatin 40 MG tablet Commonly known as: LIPITOR Take 40 mg by mouth daily.   atorvastatin 40 MG tablet Commonly known as: LIPITOR Take 1 tablet (40 mg total) by mouth daily.   Basaglar KwikPen 100 UNIT/ML Inject 10 Units into the skin daily.   blood glucose meter kit and  supplies Dispense based on patient and insurance preference. Use up to four times daily as directed. (FOR ICD-10 E10.9, E11.9).   clopidogrel 75 MG tablet Commonly known as: PLAVIX Take 1 tablet (75 mg total) by mouth daily.   losartan-hydrochlorothiazide 100-25 MG tablet Commonly known as: HYZAAR Take 1 tablet by mouth daily. What changed: Another medication with the same name was removed. Continue taking this medication, and follow the directions you see here.   metFORMIN 1000 MG tablet Commonly known as: GLUCOPHAGE Take 1 tablet (1,000 mg total) by mouth 2 (two) times daily with a meal.   True Metrix Blood Glucose Test test strip Generic drug: glucose blood Use to check blood sugar TID.   True Metrix Meter w/Device Kit Use to check blood sugar TID.   TRUEplus Lancets 28G Misc Use to check blood sugar 3  times daily.            Discharge Care Instructions  (From admission, onward)         Start     Ordered   06/03/20 0000  Discharge wound care:       Comments: Redress with Transparent Dressing.   06/03/20 1118          Follow-up Information    Kerin Perna, NP. Call.   Specialty: Internal Medicine Why: Follow up within 1-2 weeks Contact information: [place] [address] [place] Follow up.   Specialty: Rehabilitation Contact information: [address] [place] [id number] [place]       Garvin Fila, MD. Schedule an appointment as soon as possible for a visit in 4 week(s).   Specialties: Neurology, Radiology Contact information: [address] [place] [address] [phone]              No Known Allergies  Consultations:  Neurology  Neuro-Interventional Radiology  Procedures/Studies: CT ANGIO HEAD NECK W WO CM  Result Date: 05/31/2020 CLINICAL DATA:  Focal neurological deficit CT, more than 6 hours, stroke suspected. EXAM: CT ANGIOGRAPHY HEAD AND NECK TECHNIQUE: Multidetector CT imaging of the head and neck was performed using the standard protocol during bolus administration of intravenous contrast. Multiplanar CT image reconstructions and MIPs were obtained to evaluate the vascular anatomy. Carotid stenosis measurements (when applicable) are obtained utilizing NASCET criteria, using the distal internal carotid diameter as the denominator. CONTRAST:  11m OMNIPAQUE IOHEXOL 350 MG/ML SOLN COMPARISON:  MRI of the brain May 30, 2020 FINDINGS: CT HEAD FINDINGS Brain: Multiple hypodense foci are seen along the bilateral ACA and left MCA territory, corresponding to infarct seen on recent MRI of the brain. Multiple additional hypodense foci are seen in the bilateral cerebral hemispheres, corresponding to  chronic infarcts. Area as of encephalomalacia and gliosis in the left parietooccipital region and inferior left cerebellar hemisphere. No hemorrhage, hydrocephalus, extra-axial collection or mass lesion. Vascular: No hyperdense vessel. Skull: Normal. Negative for fracture or focal lesion. Sinuses: Mucosal thickening throughout the paranasal sinuses. Orbits: No acute finding. 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 Image portion shows no evidence of aneurysm or dissection. No significant stenosis at the origin of the major arch vessels. Right carotid system: No evidence of dissection, stenosis or occlusion. Left carotid system: No evidence of  dissection, stenosis or occlusion. Vertebral arteries: Left dominant. No evidence of dissection, stenosis (50% or greater) or occlusion. Skeleton: Degenerative changes of the cervical spine at C5-6 and C6-7. No acute or aggressive process identified. Other neck: Mildly prominent bilateral cervical lymph nodes, nonspecific. May be reactive. Prominent adenoid tissue. Upper chest: Negative. Review of the MIP images confirms the above findings CTA HEAD FINDINGS Anterior circulation: Intracranial bilateral internal carotid arteries abnormal course and caliber. Bilateral A1 segments are patent noting a hypoplastic right A1/ACA. Interval development of a new focal occlusion versus high-grade stenosis at the mid A2 segment of the left anterior cerebral artery with normal distal opacification. Luminal irregularity is noted along the bilateral ACA vascular trees. Interval worsening of bilateral multifocal areas of stenosis of the bilateral MCA vascular tree preserving the bilateral M1 segments with severe stenosis at the proximal left M2 segment and persistent occlusion at the distal left M3/MCA  superior division branch. Severe stenosis at the proximal right M2/MCA superior division branch and M3/MCA posterior division branches. Posterior circulation: The intracranial bilateral vertebral arteries and basilar artery are maintained. Luminal irregularity is seen along the bilateral posterior cerebral arteries with multifocal areas of mild stenosis. Venous sinuses: Poorly opacified. Review of the MIP images confirms the above findings IMPRESSION: 1. Multiple hypodense foci along the bilateral ACA and left MCA vascular trees correlating with acute/subacute infarcts described on recent MRI. 2. Remote small lacunar infarct in the bilateral cerebral and left cerebellar hemisphere, as described above. 3. No hemodynamically significant stenosis in the major neck arteries. 4. Prominent luminal irregularity of the intracranial vasculature, may represent advanced atherosclerotic disease versus vasculitis. 5. New occlusion versus high-grade stenosis at the left A2/ACA segment. 6. Multifocal areas of high-grade stenosis the bilateral MCA vascular tree, as described above, with persistent occlusion of the distal left M3/MCA superior division branch and worsening stenosis of right M3/M branches. Electronically Signed   By: Pedro Earls M.D.   On: 05/31/2020 18:11   CT HEAD WO CONTRAST  Result Date: 06/03/2020 CLINICAL DATA:  Transient ischemic attack. EXAM: CT HEAD WITHOUT CONTRAST TECHNIQUE: Contiguous axial images were obtained from the base of the skull through the vertex without intravenous contrast. COMPARISON:  May 30, 2020. FINDINGS: Brain: Mild chronic ischemic white matter disease is noted. Old left posterior parietal infarction is noted. Stable left frontal low density is noted consistent with acute to subacute infarction as noted on prior MRI. No hemorrhage is noted. No mass effect or midline shift is noted. Ventricular size is within normal limits. Old left cerebellar infarction is noted.  Vascular: No hyperdense vessel or unexpected calcification. Skull: Normal. Negative for fracture or focal lesion. Sinuses/Orbits: Bilateral ethmoid and maxillary sinusitis is noted. Other: None. IMPRESSION: Stable left frontal low density is noted consistent with acute to subacute infarction as noted on recent MRI. Old left cerebellar and posterior parietal infarctions are noted. No significant changes noted compared to prior exam. Electronically Signed   By: Marijo Conception M.D.   On: 06/03/2020 16:16   CT HEAD WO CONTRAST  Result Date: 05/30/2020 CLINICAL DATA:  Mental status changes. EXAM: CT HEAD WITHOUT CONTRAST TECHNIQUE: Contiguous axial images were obtained from the base of the skull through the vertex without intravenous contrast. COMPARISON:  04/21/2020 FINDINGS: Brain: Periventricular white matter changes are consistent with small vessel disease. Remote infarcts involving the LEFT frontal lobe, LEFT posterior parietal lobe, LEFT cerebellum. Stable appearance of patchy low-attenuation in the RIGHT corona radiata. There is no intra  or extra-axial fluid collection or mass lesion. The basilar cisterns and ventricles have a normal appearance. There is no CT evidence for acute infarction or hemorrhage. Vascular: No hyperdense vessel or unexpected calcification. Skull: Normal. Negative for fracture or focal lesion. Sinuses/Orbits: Moderate mucosal thickening of the paranasal sinuses. No air-fluid levels. Other: None. IMPRESSION: 1. Stable appearance of chronic LEFT infarcts. 2. Periventricular white matter changes are stable. 3.  No evidence for acute intracranial abnormality. 4. Chronic sinus changes. Electronically Signed   By: Nolon Nations M.D.   On: 05/30/2020 14:34   MR Brain Wo Contrast (neuro protocol)  Result Date: 05/30/2020 CLINICAL DATA:  Mental status change, unknown cause. EXAM: MRI HEAD WITHOUT CONTRAST TECHNIQUE: Multiplanar, multiecho pulse sequences of the brain and surrounding  structures were obtained without intravenous contrast. COMPARISON:  Noncontrast head CT performed earlier today 05/30/2020. CT angiogram head/neck 04/21/2020. Brain MRI 04/20/2020. FINDINGS: Brain: Mild cerebral and cerebellar atrophy. Redemonstrated patchy cortical/subcortical infarcts within the mid left frontal lobe/frontal operculum, now subacute. However, new from the prior brain MRI of 04/20/2020, there are new acute infarcts within the cortical and subcortical bilateral frontal lobes, and within the left callosal body/genu (bilateral MCA and ACA territories). The largest acute infarct is present within the left callosal body/genu and measures 2 cm. Redemonstrated chronic cortical/subcortical infarct within the left parietal lobe. Redemonstrated chronic infarct within the left cerebellar hemisphere. Background moderate severe multifocal T2/FLAIR hyperintensity within the cerebral white matter, nonspecific but compatible with chronic small vessel ischemic disease. This includes chronic lacunar infarcts within the bilateral centrum semiovale and corona radiata. Redemonstrated chronic microhemorrhages within the bilateral parietal lobes. No evidence of intracranial mass. No extra-axial fluid collection. No midline shift. Vascular: Expected proximal arterial flow voids. Skull and upper cervical spine: No focal marrow lesion. Sinuses/Orbits: Visualized orbits show no acute finding. Mild mucosal thickening within the bilateral frontal sinuses. Moderate bilateral ethmoid sinus mucosal thickening. Mild bilateral maxillary sinus mucosal thickening. IMPRESSION: Redemonstrated patchy cortical/subcortical infarcts within the mid left frontal lobe/frontal operculum, now subacute. New from the prior brain MRI of 04/20/2020, there are small acute infarcts within the cortical and subcortical frontal lobes bilaterally, and within the left callosal body/genu (bilateral MCA and ACA territories). The largest acute infarct is  present within the left callosal body/genu, measuring 2 cm. 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 Stable background severe cerebral white matter chronic small vessel ischemic disease. Stable mild generalized parenchymal atrophy. Paranasal sinus disease, as described. Electronically Signed   By: Kellie Simmering DO   On: 05/30/2020 16:10   ECHO TEE  Result Date: 06/01/2020    TRANSESOPHOGEAL ECHO REPORT   Patient Name:   Nathaniel Spears Date of Exam: 06/01/2020 Medical Rec #:  [id number]      Height:       73.0 in Accession #:    [id number]     Weight:       230.0 lb Date of Birth:  [birth year]       BSA:          2.283 m Patient Age:    11 years       BP:           185/111 mmHg Patient Gender: M              HR:           101 bpm. Exam Location:  Inpatient Procedure: Transesophageal Echo, Color Doppler, Cardiac Doppler  and Saline            Contrast Bubble Study Indications:     Stroke  History:         Patient has prior history of Echocardiogram examinations, most                  recent 04/21/2020. Risk Factors:Hypertension.  Sonographer:     Bernadene Person RDCS Referring Phys:  [id number] Leanor Kail Diagnosing Phys: Oswaldo Milian MD PROCEDURE: After discussion of the risks and benefits of a TEE, an informed consent was obtained from the patient. The transesophogeal probe was passed without difficulty through the esophogus of the patient. Local oropharyngeal anesthetic was provided with Cetacaine. Sedation performed by different physician. The patient was monitored while under deep sedation. Anesthestetic sedation was provided intravenously by Anesthesiology: 477.88m of Propofol. The patient developed Oropharyngeal bleeding during  the procedure but no further bleeding was noted during recovery. IMPRESSIONS  1. Left ventricular ejection fraction, by estimation, is 60 to 65%. The left ventricle has normal  function. The left ventricle has no regional wall motion abnormalities. There is severe left ventricular hypertrophy.  2. Right ventricular systolic function is normal. The right ventricular size is normal.  3. No left atrial/left atrial appendage thrombus was detected.  4. The mitral valve is normal in structure. Trivial mitral valve regurgitation.  5. The aortic valve is tricuspid. Aortic valve regurgitation is trivial.  6. Agitated saline contrast bubble study was negative, with no evidence of any interatrial shunt. Conclusion(s)/Recommendation(s): No LA/LAA thrombus identified. Negative bubble study for interatrial shunt. No intracardiac source of embolism detected on this on this transesophageal echocardiogram. FINDINGS  Left Ventricle: Left ventricular ejection fraction, by estimation, is 60 to 65%. The left ventricle has normal function. The left ventricle has no regional wall motion abnormalities. The left ventricular internal cavity size was normal in size. There is  severe left ventricular hypertrophy. Right Ventricle: The right ventricular size is normal. No increase in right ventricular wall thickness. Right ventricular systolic function is normal. Left Atrium: Left atrial size was normal in size. No left atrial/left atrial appendage thrombus was detected. Right Atrium: Right atrial size was normal in size. Pericardium: There is no evidence of pericardial effusion. Mitral Valve: The mitral valve is normal in structure. Trivial mitral valve regurgitation. Tricuspid Valve: The tricuspid valve is normal in structure. Tricuspid valve regurgitation is trivial. Aortic Valve: The aortic valve is tricuspid. Aortic valve regurgitation is trivial. Pulmonic Valve: The pulmonic valve was grossly normal. Pulmonic valve regurgitation is not visualized. Aorta: The aortic root is normal in size and structure. IAS/Shunts: No atrial level shunt detected by color flow Doppler. Agitated saline contrast was given  intravenously to evaluate for intracardiac shunting. Agitated saline contrast bubble study was negative, with no evidence of any interatrial shunt. COswaldo MilianMD Electronically signed by COswaldo MilianMD Signature Date/Time: 06/01/2020/6:21:55 PM    Final      Cerebral Arteriogram Findings. 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 3. Mod severe focal stenosis of Lt MCA inf division parietal branch. 4. Focal stenosis of Lt ACA prox pericallosal branches.  Subjective: Seen and examined at bedside and he is doing fairly well.  He denies any chest pain, shortness breath or lightheadedness or dizziness.  No nausea or vomiting.  Felt little bit better today and is stable to be discharged home and follow-up with outpatient therapies.  Discharge Exam: Vitals:   06/03/20 0759 06/03/20 1117  BP: (!) 146/98 (!) 143/100  Pulse: 100 98  Resp: 16 (!) 22  Temp: 98.3 F (36.8 C) 98.7 F (37.1 C)  SpO2: 95% 97%   Vitals:   06/02/20 1958 06/03/20 0458 06/03/20 0759 06/03/20 1117  BP: (!) 152/104 133/88 (!) 146/98 (!) 143/100  Pulse: 96 94 100 98  Resp: _0 (!) 22  Temp: 98.9 F (37.2 C) 98.2 F (36.8 C) 98.3 F (36.8 C) 98.7 F (37.1 C)  TempSrc: Oral Oral Oral Oral  SpO2: 96% 93% 95% 97%  Weight:      Height:       General: Pt is alert, awake, not in acute distress Cardiovascular: RRR, S1/S2 +, no rubs, no gallops Respiratory: Diminished bilaterally, no wheezing, no rhonchi Abdominal: Soft, NT, Distended 2/2 body habitus, bowel sounds + Extremities: no edema, no cyanosis  The results of significant diagnostics from this hospitalization (including imaging, microbiology, ancillary and laboratory) are listed below for reference.    Microbiology: Recent Results (from the past 240 hour(s))  Resp Panel by RT-PCR (Flu A&B, Covid) Nasopharyngeal Swab     Status: None   Collection Time: 05/30/20   1:55 PM   Specimen: Nasopharyngeal Swab; Nasopharyngeal(NP) swabs in vial transport medium  Result Value Ref Range Status   SARS Coronavirus 2 by RT PCR NEGATIVE NEGATIVE Final    Comment: (NOTE) SARS-CoV-2 target nucleic acids are NOT DETECTED.  The SARS-CoV-2 RNA is generally detectable in upper respiratory specimens during the acute phase of infection. The lowest concentration of SARS-CoV-2 viral copies this assay can detect is 138 copies/mL. A negative result does not preclude SARS-Cov-2 infection and should not be used as the sole basis for treatment or other patient management decisions. A negative result may occur with  improper specimen collection/handling, submission of specimen other than nasopharyngeal swab, presence of viral mutation(s) within the areas targeted by this assay, and inadequate number of viral copies(<138 copies/mL). A negative result must be combined with clinical observations, patient history, and epidemiological information. The expected result is Negative.  Fact Sheet for Patients:  EntrepreneurPulse.com.au  Fact Sheet for Healthcare Providers:  IncredibleEmployment.be  This test is no t yet approved or cleared by the [place] FDA and  has been authorized for detection and/or diagnosis of SARS-CoV-2 by FDA under an Emergency Use Authorization (EUA). This EUA will remain  in effect (meaning this test can be used) for the duration of the COVID-19 declaration under Section 564(b)(1) of the Act, 21 U.S.C.section 360bbb-3(b)(1), unless the authorization is terminated  or revoked sooner.       Influenza A by PCR NEGATIVE NEGATIVE Final   Influenza B by PCR NEGATIVE NEGATIVE Final    Comment: (NOTE) The Xpert Xpress SARS-CoV-2/FLU/RSV plus assay is intended as an aid in the diagnosis of influenza from Nasopharyngeal swab specimens and should not be used as a sole basis for treatment. Nasal washings and aspirates  are unacceptable for Xpert Xpress SARS-CoV-2/FLU/RSV testing.  Fact Sheet for Patients: EntrepreneurPulse.com.au  Fact Sheet for Healthcare Providers: IncredibleEmployment.be  This test is not yet approved or cleared by the [place] FDA and has been authorized for detection and/or diagnosis of SARS-CoV-2 by FDA under an Emergency Use Authorization (EUA). This EUA will remain in effect (meaning this test can be used) for the duration of the COVID-19 declaration under Section 564(b)(1) of the Act, 21 U.S.C. section 360bbb-3(b)(1), unless the authorization is terminated or revoked.  Performed at [facility], [place] [address], [place], [place] [address]     Labs: BNP (last 3 results) No results for input(s): BNP in the last 8760 hours. Basic Metabolic Panel: Recent Labs  Lab 05/31/20 0330 06/01/20 0059 06/02/20 0038 06/03/20 0129 06/03/20 1540  NA 140 140 139 138 140  K 3.0* 3.3* 3.5 3.8 3.9  CL 107 108 105 106 108  CO2 _0 GLUCOSE 120* 117* 99 98 104*  BUN _1 CREATININE 1.17 1.12 1.24 1.22 1.47*  CALCIUM 8.2* 8.5* 8.7* 8.7* 8.8*  MG 1.5* 1.8 1.6* 1.9  --    Liver Function Tests: Recent Labs  Lab 05/30/20 1326 06/03/20 1540  AST 14* 13*  ALT 14 14  ALKPHOS 69 62  BILITOT 0.4 0.7  PROT 7.3 7.2  ALBUMIN 3.7 3.2*   No results for input(s): LIPASE, AMYLASE in the last 168 hours. Recent Labs  Lab 05/30/20 1328  AMMONIA 38*   CBC: Recent Labs  Lab 05/30/20 1326 05/30/20 1340 05/31/20 0330 06/01/20 0059 06/02/20 0038 06/03/20 0129 06/03/20 1540  WBC 7.5  --  6.9 6.8 7.4 6.8 7.5  NEUTROABS 5.0  --  3.7 3.5 4.3 4.0  --   HGB 12.7*   < > 11.4* 11.8* 12.2* 11.9* 12.3*  HCT 38.7*   < > 35.1* 36.8* 37.6* 36.5* 38.6*  MCV 79.5*  --  80.0 79.8* 79.0* 78.5* 81.1  PLT 175  --  174 200 222 232 239   < > = values in this interval not displayed.   Cardiac Enzymes: No results for  input(s): CKTOTAL, CKMB, CKMBINDEX, TROPONINI in the last 168 hours. BNP: Invalid input(s): POCBNP CBG: Recent Labs  Lab 06/02/20 0808 06/02/20 1323 06/02/20 1957 06/02/20 2344 06/03/20 1119  GLUCAP 121* 89 128* 97 149*   D-Dimer No results for input(s): DDIMER in the last 72 hours. Hgb A1c No results for input(s): HGBA1C in the last 72 hours. Lipid Profile No results for input(s): CHOL, HDL, LDLCALC, TRIG, CHOLHDL, LDLDIRECT in the last 72 hours. Thyroid function studies No results for input(s): TSH, T4TOTAL, T3FREE, THYROIDAB in the last 72 hours.  Invalid input(s): FREET3 Anemia work up Recent Labs    06/03/20 0129  VITAMINB12 349  FOLATE 19.8  FERRITIN 302  TIBC 301  IRON 26*  RETICCTPCT 0.8   Urinalysis    Component Value Date/Time   COLORURINE YELLOW 05/30/2020 [place] 05/30/2020 1644   LABSPEC 1.016 05/30/2020 1644   PHURINE 7.0 05/30/2020 1644   GLUCOSEU >=500 (A) 05/30/2020 1644   HGBUR NEGATIVE 05/30/2020 1644   BILIRUBINUR NEGATIVE 05/30/2020 [place] 05/30/2020 1644   PROTEINUR NEGATIVE 05/30/2020 1644   NITRITE NEGATIVE 05/30/2020 1644   LEUKOCYTESUR NEGATIVE 05/30/2020 1644   Sepsis Labs Invalid input(s): PROCALCITONIN,  WBC,  LACTICIDVEN Microbiology Recent Results (from the past 240 hour(s))  Resp Panel by RT-PCR (Flu A&B, Covid) Nasopharyngeal Swab     Status: None   Collection Time: 05/30/20  1:55 PM   Specimen: Nasopharyngeal Swab; Nasopharyngeal(NP) swabs in vial transport medium  Result Value Ref Range Status   SARS Coronavirus 2 by RT PCR NEGATIVE NEGATIVE Final    Comment: (NOTE) SARS-CoV-2 target nucleic acids are NOT DETECTED.  The SARS-CoV-2 RNA is generally detectable in upper respiratory specimens during the acute phase of infection. The lowest concentration of SARS-CoV-2 viral copies this assay can detect is 138 copies/mL. A  negative result does not preclude SARS-Cov-2 infection and should  not be used as the sole basis for treatment or other patient management decisions. A negative result may occur with  improper specimen collection/handling, submission of specimen other than nasopharyngeal swab, presence of viral mutation(s) within the areas targeted by this assay, and inadequate number of viral copies(<138 copies/mL). A negative result must be combined with clinical observations, patient history, and epidemiological information. The expected result is Negative.  Fact Sheet for Patients:  EntrepreneurPulse.com.au  Fact Sheet for Healthcare Providers:  IncredibleEmployment.be  This test is no t yet approved or cleared by the [place] FDA and  has been authorized for detection and/or diagnosis of SARS-CoV-2 by FDA under an Emergency Use Authorization (EUA). This EUA will remain  in effect (meaning this test can be used) for the duration of the COVID-19 declaration under Section 564(b)(1) of the Act, 21 U.S.C.section 360bbb-3(b)(1), unless the authorization is terminated  or revoked sooner.       Influenza A by PCR NEGATIVE NEGATIVE Final   Influenza B by PCR NEGATIVE NEGATIVE Final    Comment: (NOTE) The Xpert Xpress SARS-CoV-2/FLU/RSV plus assay is intended as an aid in the diagnosis of influenza from Nasopharyngeal swab specimens and should not be used as a sole basis for treatment. Nasal washings and aspirates are unacceptable for Xpert Xpress SARS-CoV-2/FLU/RSV testing.  Fact Sheet for Patients: EntrepreneurPulse.com.au  Fact Sheet for Healthcare Providers: IncredibleEmployment.be  This test is not yet approved or cleared by the [place] FDA and has been authorized for detection and/or diagnosis of SARS-CoV-2 by FDA under an Emergency Use Authorization (EUA). This EUA will remain in effect (meaning this test can be used) for the duration of the COVID-19 declaration under  Section 564(b)(1) of the Act, 21 U.S.C. section 360bbb-3(b)(1), unless the authorization is terminated or revoked.  Performed at [facility], [place] [address], [place], [place] [address]    Time coordinating discharge: 35 minutes  SIGNED:  Kerney Elbe, DO [facility] 06/03/2020, 7:05 PM Pager is on [place]  If 7PM-7AM, please contact night-coverage [email or web address]

## 2020-06-03 NOTE — Discharge Instructions (Addendum)
  You were evaluated in the Emergency Department and after careful evaluation, we did not find any emergent condition requiring admission or further testing in the hospital.   Your exam/testing today was overall reassuring.  Symptoms seem to be due to your previous stroke.  However we want you to start taking seizure medications for prophylaxis, please pick these up and start these tomorrow as we discussed.  Please follow-up with your neurologist and follow all the recommendations from your discharge paperwork from the hospital today.  If you have any new or worsening concerning symptom please back to the ER, make sure to schedule appointment outpatient with your neurologist. Please return to the Emergency Department if you experience any worsening of your condition.  Thank you for allowing [place] to be a part of your care. Please speak to your pharmacist about any new medications prescribed today in regards to side effects or interactions with other medications.

## 2020-06-04 ENCOUNTER — Ambulatory Visit: Payer: Self-pay | Admitting: Pharmacist

## 2020-06-04 ENCOUNTER — Inpatient Hospital Stay (HOSPITAL_COMMUNITY)
Admission: EM | Admit: 2020-06-04 | Discharge: 2020-06-07 | DRG: 065 | Disposition: A | Discharge status: Other Acute Inpatient Hospital | Payer: Medicaid Other | Attending: Internal Medicine | Admitting: Internal Medicine

## 2020-06-04 ENCOUNTER — Other Ambulatory Visit: Payer: Self-pay

## 2020-06-04 ENCOUNTER — Telehealth: Payer: Self-pay

## 2020-06-04 ENCOUNTER — Emergency Department (HOSPITAL_COMMUNITY): Payer: Medicaid Other

## 2020-06-04 ENCOUNTER — Inpatient Hospital Stay (HOSPITAL_COMMUNITY): Payer: Medicaid Other

## 2020-06-04 ENCOUNTER — Encounter (HOSPITAL_COMMUNITY): Payer: Self-pay | Admitting: Emergency Medicine

## 2020-06-04 DIAGNOSIS — E669 Obesity, unspecified: Secondary | ICD-10-CM | POA: Diagnosis present

## 2020-06-04 DIAGNOSIS — R531 Weakness: Secondary | ICD-10-CM

## 2020-06-04 DIAGNOSIS — G8191 Hemiplegia, unspecified affecting right dominant side: Secondary | ICD-10-CM | POA: Diagnosis present

## 2020-06-04 DIAGNOSIS — F1490 Cocaine use, unspecified, uncomplicated: Secondary | ICD-10-CM | POA: Diagnosis not present

## 2020-06-04 DIAGNOSIS — R4701 Aphasia: Secondary | ICD-10-CM | POA: Diagnosis present

## 2020-06-04 DIAGNOSIS — I639 Cerebral infarction, unspecified: Secondary | ICD-10-CM | POA: Diagnosis not present

## 2020-06-04 DIAGNOSIS — I10 Essential (primary) hypertension: Secondary | ICD-10-CM | POA: Diagnosis present

## 2020-06-04 DIAGNOSIS — F17200 Nicotine dependence, unspecified, uncomplicated: Secondary | ICD-10-CM | POA: Diagnosis present

## 2020-06-04 DIAGNOSIS — Z7151 Drug abuse counseling and surveillance of drug abuser: Secondary | ICD-10-CM

## 2020-06-04 DIAGNOSIS — F17210 Nicotine dependence, cigarettes, uncomplicated: Secondary | ICD-10-CM | POA: Diagnosis present

## 2020-06-04 DIAGNOSIS — Z7984 Long term (current) use of oral hypoglycemic drugs: Secondary | ICD-10-CM

## 2020-06-04 DIAGNOSIS — Z8673 Personal history of transient ischemic attack (TIA), and cerebral infarction without residual deficits: Secondary | ICD-10-CM

## 2020-06-04 DIAGNOSIS — Z79899 Other long term (current) drug therapy: Secondary | ICD-10-CM

## 2020-06-04 DIAGNOSIS — E1165 Type 2 diabetes mellitus with hyperglycemia: Secondary | ICD-10-CM | POA: Diagnosis present

## 2020-06-04 DIAGNOSIS — Z716 Tobacco abuse counseling: Secondary | ICD-10-CM

## 2020-06-04 DIAGNOSIS — Z7902 Long term (current) use of antithrombotics/antiplatelets: Secondary | ICD-10-CM

## 2020-06-04 DIAGNOSIS — Z7982 Long term (current) use of aspirin: Secondary | ICD-10-CM

## 2020-06-04 DIAGNOSIS — R29810 Facial weakness: Secondary | ICD-10-CM | POA: Diagnosis present

## 2020-06-04 DIAGNOSIS — F17209 Nicotine dependence, unspecified, with unspecified nicotine-induced disorders: Secondary | ICD-10-CM | POA: Diagnosis not present

## 2020-06-04 DIAGNOSIS — E119 Type 2 diabetes mellitus without complications: Secondary | ICD-10-CM

## 2020-06-04 DIAGNOSIS — R4702 Dysphasia: Secondary | ICD-10-CM | POA: Diagnosis present

## 2020-06-04 DIAGNOSIS — W1830XA Fall on same level, unspecified, initial encounter: Secondary | ICD-10-CM | POA: Diagnosis present

## 2020-06-04 DIAGNOSIS — Z20822 Contact with and (suspected) exposure to covid-19: Secondary | ICD-10-CM | POA: Diagnosis present

## 2020-06-04 DIAGNOSIS — Z6830 Body mass index (BMI) 30.0-30.9, adult: Secondary | ICD-10-CM

## 2020-06-04 DIAGNOSIS — R29707 NIHSS score 7: Secondary | ICD-10-CM | POA: Diagnosis present

## 2020-06-04 DIAGNOSIS — Z794 Long term (current) use of insulin: Secondary | ICD-10-CM

## 2020-06-04 LAB — COMPREHENSIVE METABOLIC PANEL
ALT: 14 U/L (ref 0–44)
AST: 15 U/L (ref 15–41)
Albumin: 3.9 g/dL (ref 3.5–5.0)
Alkaline Phosphatase: 67 U/L (ref 38–126)
Anion gap: 9 (ref 5–15)
BUN: 20 mg/dL (ref 6–20)
CO2: 28 mmol/L (ref 22–32)
Calcium: 9.2 mg/dL (ref 8.9–10.3)
Chloride: 104 mmol/L (ref 98–111)
Creatinine, Ser: 1.36 mg/dL — ABNORMAL HIGH (ref 0.61–1.24)
GFR, Estimated: >60 mL/min (ref >60)
Glucose, Bld: 152 mg/dL — ABNORMAL HIGH (ref 70–99)
Potassium: 3.7 mmol/L (ref 3.5–5.1)
Sodium: 141 mmol/L (ref 135–145)
Total Bilirubin: 0.6 mg/dL (ref 0.3–1.2)
Total Protein: 8.4 g/dL — ABNORMAL HIGH (ref 6.5–8.1)

## 2020-06-04 LAB — PROTIME-INR
INR: 1 (ref 0.8–1.2)
Prothrombin Time: 13.6 seconds (ref 11.4–15.2)

## 2020-06-04 LAB — DIFFERENTIAL
Abs Immature Granulocytes: 0.02 10*3/uL (ref 0.00–0.07)
Basophils Absolute: 0 10*3/uL (ref 0.0–0.1)
Basophils Relative: 0 %
Eosinophils Absolute: 0.2 10*3/uL (ref 0.0–0.5)
Eosinophils Relative: 2 %
Immature Granulocytes: 0 %
Lymphocytes Relative: 27 %
Lymphs Abs: 2 10*3/uL (ref 0.7–4.0)
Monocytes Absolute: 0.4 10*3/uL (ref 0.1–1.0)
Monocytes Relative: 6 %
Neutro Abs: 4.7 10*3/uL (ref 1.7–7.7)
Neutrophils Relative %: 65 %

## 2020-06-04 LAB — RESP PANEL BY RT-PCR (FLU A&B, COVID) ARPGX2
Influenza A by PCR: NEGATIVE
Influenza B by PCR: NEGATIVE
SARS Coronavirus 2 by RT PCR: NEGATIVE

## 2020-06-04 LAB — GLUCOSE, CSF: Glucose, CSF: 71 mg/dL — ABNORMAL HIGH (ref 40–70)

## 2020-06-04 LAB — CSF CELL COUNT WITH DIFFERENTIAL
RBC Count, CSF: 72 /mm3 — ABNORMAL HIGH
Tube #: 1
WBC, CSF: 3 /mm3 (ref 0–5)

## 2020-06-04 LAB — I-STAT BETA HCG BLOOD, ED (MC, WL, AP ONLY): I-stat hCG, quantitative: <5 m[IU]/mL (ref <5)

## 2020-06-04 LAB — PROTEIN, CSF: Total  Protein, CSF: 31 mg/dL (ref 15–45)

## 2020-06-04 LAB — CBC
HCT: 38.5 % — ABNORMAL LOW (ref 39.0–52.0)
Hemoglobin: 12.3 g/dL — ABNORMAL LOW (ref 13.0–17.0)
MCH: 25.6 pg — ABNORMAL LOW (ref 26.0–34.0)
MCHC: 31.9 g/dL (ref 30.0–36.0)
MCV: 80.2 fL (ref 80.0–100.0)
Platelets: 282 10*3/uL (ref 150–400)
RBC: 4.8 MIL/uL (ref 4.22–5.81)
RDW: 12.2 % (ref 11.5–15.5)
WBC: 7.4 10*3/uL (ref 4.0–10.5)
nRBC: 0 % (ref 0.0–0.2)

## 2020-06-04 LAB — GLUCOSE, CAPILLARY
Glucose-Capillary: 113 mg/dL — ABNORMAL HIGH (ref 70–99)
Glucose-Capillary: 102 mg/dL — ABNORMAL HIGH (ref 70–99)
Glucose-Capillary: 227 mg/dL — ABNORMAL HIGH (ref 70–99)

## 2020-06-04 LAB — CBG MONITORING, ED: Glucose-Capillary: 150 mg/dL — ABNORMAL HIGH (ref 70–99)

## 2020-06-04 LAB — I-STAT CHEM 8, ED
BUN: 18 mg/dL (ref 6–20)
Calcium, Ion: 1.26 mmol/L (ref 1.15–1.40)
Chloride: 102 mmol/L (ref 98–111)
Creatinine, Ser: 1.4 mg/dL — ABNORMAL HIGH (ref 0.61–1.24)
Glucose, Bld: 141 mg/dL — ABNORMAL HIGH (ref 70–99)
HCT: 54 % — ABNORMAL HIGH (ref 39.0–52.0)
Hemoglobin: 18.4 g/dL — ABNORMAL HIGH (ref 13.0–17.0)
Potassium: 3.7 mmol/L (ref 3.5–5.1)
Sodium: 143 mmol/L (ref 135–145)
TCO2: 26 mmol/L (ref 22–32)

## 2020-06-04 LAB — APTT: aPTT: 35 seconds (ref 24–36)

## 2020-06-04 LAB — ETHANOL: Alcohol, Ethyl (B): <10 mg/dL (ref <10)

## 2020-06-04 IMAGING — CT CT HEAD CODE STROKE
3 series · 14 of 47 positions shown, 16 images · non-contrast
Comparison: CT angiogram head/neck [DATE]. Brain MRI
[DATE].

CLINICAL DATA: Code stroke. Neuro deficit, acute, stroke suspected.
Additional history provided: Last known normal [DATE] p.m., weakness,
fall in part today, 3 recent strokes.

EXAM:
CT HEAD WITHOUT CONTRAST
TECHNIQUE: Contiguous axial images were obtained from the base of the skull
through the vertex without intravenous contrast.

[Series 3: head wo · axial · 0.47mm/px · z∈[-120,+20]mm · 8 of 34 slices shown, 10 images]
[im 3/34  brain]
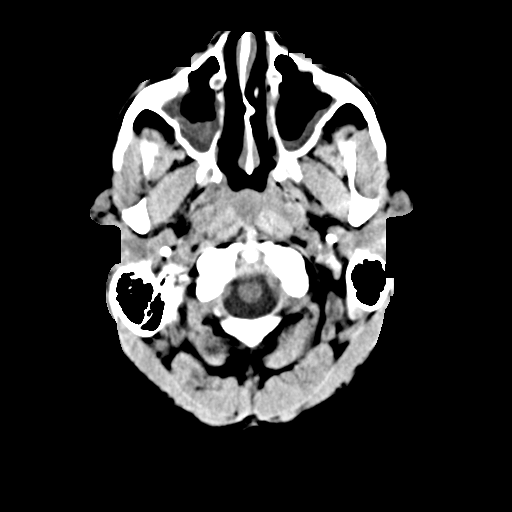
[im 3/34  bone]
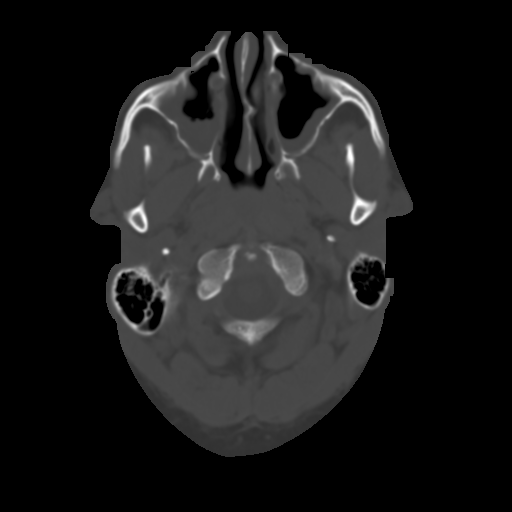
[im 7/34  brain]
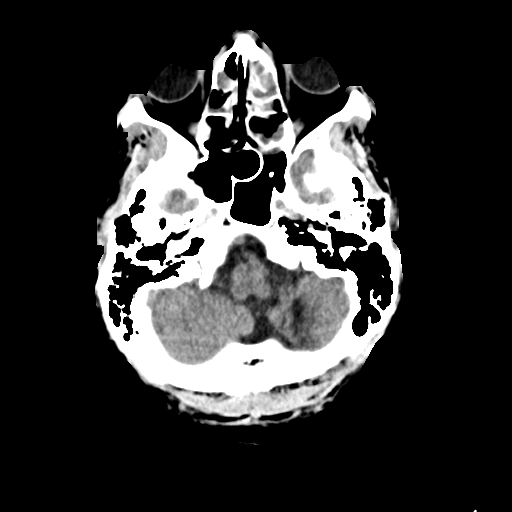
[im 11/34  brain]
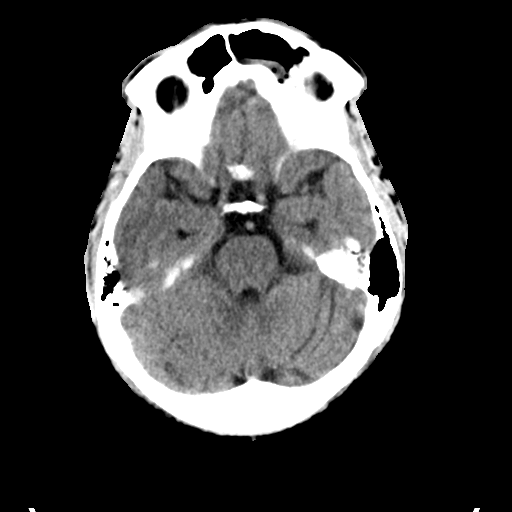
[im 15/34  brain]
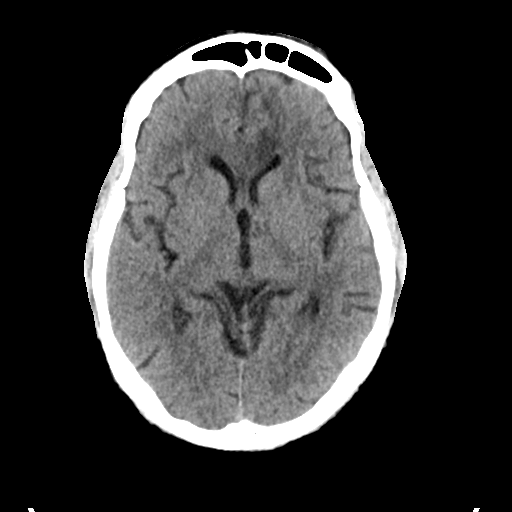
[im 19/34  brain]
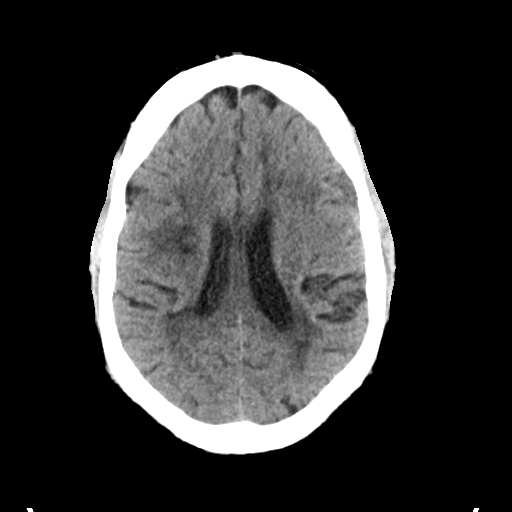
[im 19/34  bone]
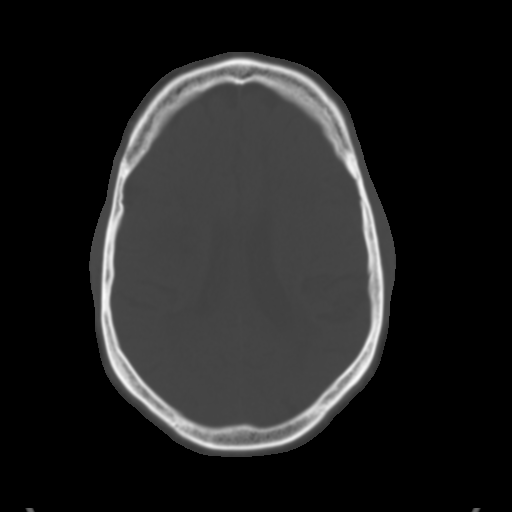
[im 23/34  brain]
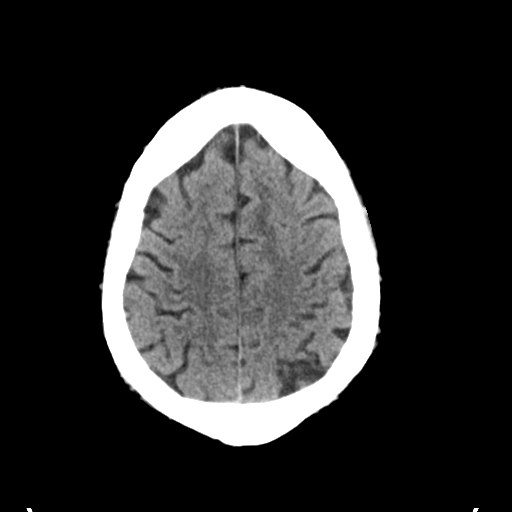
[im 27/34  brain]
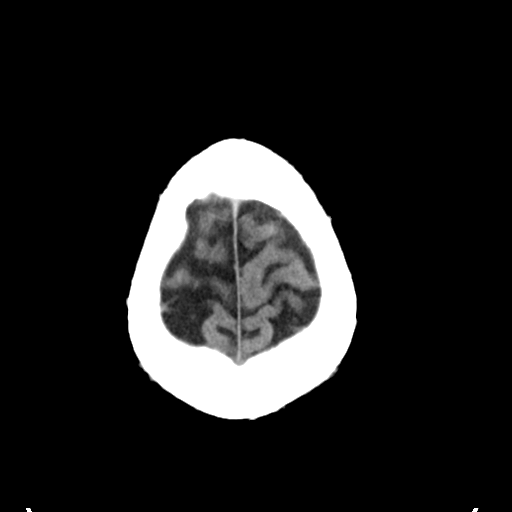
[im 31/34  brain]
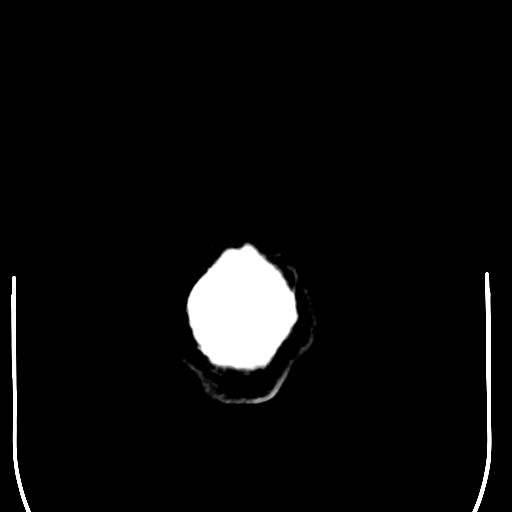

[Series 6: coronal soft tissue · coronal · 0.32mm/px · 3 of 69 slices shown]
[im 23/69  brain]
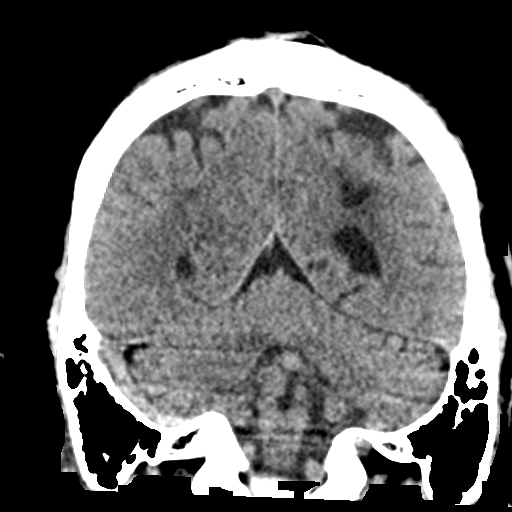
[im 31/69  brain]
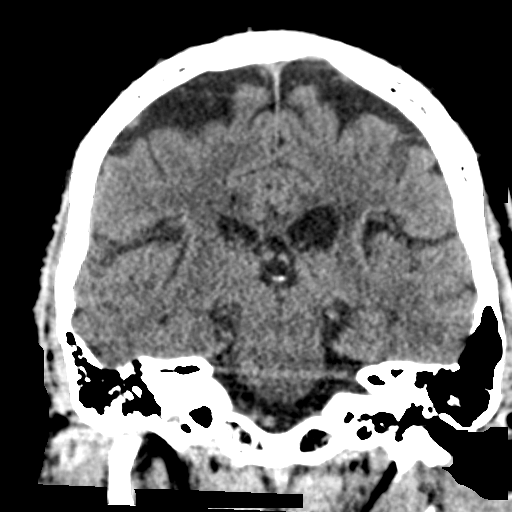
[im 38/69  brain]
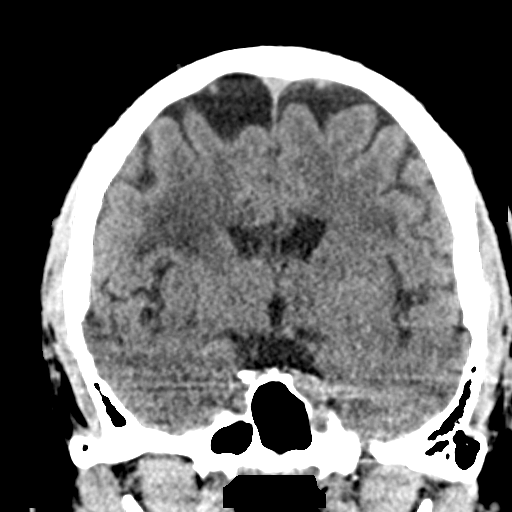

[Series 7: sagittal soft tissue · sagittal · 0.32mm/px · 3 of 55 slices shown]
[im 19/55  brain]
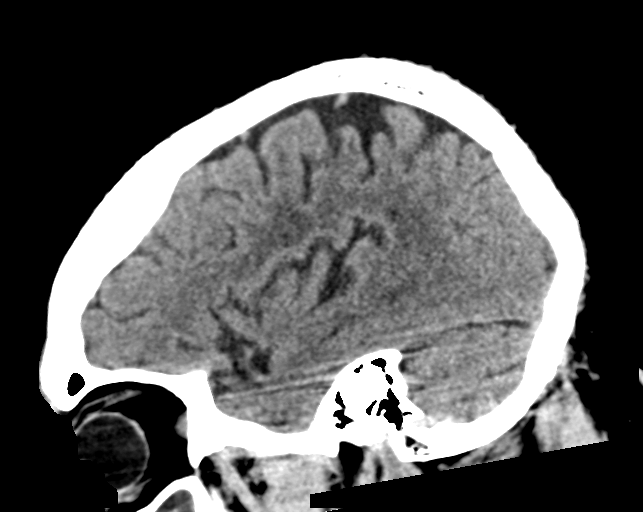
[im 28/55  brain]
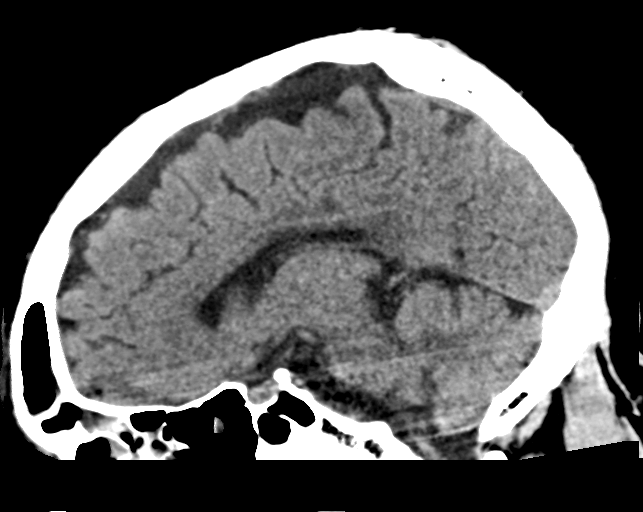
[im 37/55  brain]
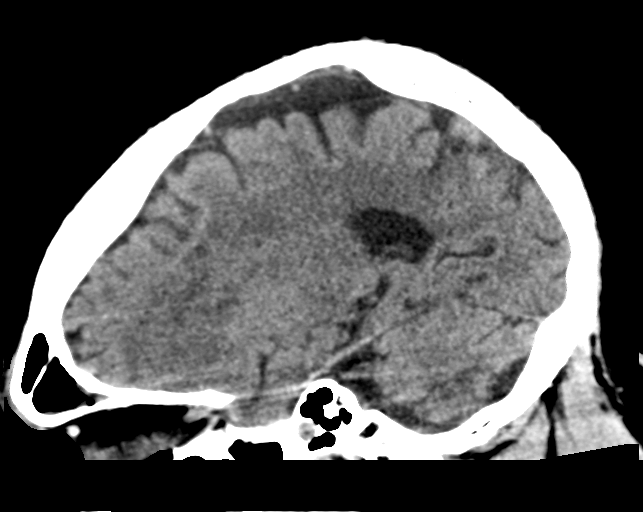

[14 of 47 positions shown; findings below may reference images not displayed]

FINDINGS: Brain:

Mild cerebral and cerebellar atrophy.

No interval acute demarcated cortical infarct is identified. No
evidence of acute intracranial hemorrhage.

Redemonstrated subacute infarction changes within the anterior left
frontal lobe white matter and left callosal body/genu. Additional
known subacute infarcts within the bilateral cerebral hemispheres
were better appreciated on the brain MRI of [DATE].

Redemonstrated chronic cortical/subcortical infarct within the left
parietal lobe.

Stable background advanced chronic small vessel ischemic disease
within the cerebral white matter, including multiple chronic
small-vessel infarcts within the bilateral cerebral white matter.

Redemonstrated chronic infarct within the left cerebellum.

No extra-axial fluid collection.

No evidence of intracranial mass.

No midline shift.

Vascular: No hyperdense vessel.

Skull: Normal. Negative for fracture or focal lesion.

Sinuses/Orbits: Visualized orbits show no acute finding.
Pansinusitis. Most notably, there is severe mucosal thickening
within the left frontal sinus, within the bilateral ethmoid air
cells and within the bilateral maxillary sinuses. Superimposed
scattered small volume frothy secretions.

ASPECTS (Alberta Stroke Program Early CT Score)

- Ganglionic level infarction (caudate, lentiform nuclei, internal
capsule, insula, M1-M3 cortex): 7

- Supraganglionic infarction (M4-M6 cortex): 3

Total score (0-10 with 10 being normal): 10 (when discounting
subacute and chronic infarcts).

These results were called by telephone at the time of interpretation
on [DATE] at [DATE] to provider BLAIN , who verbally
acknowledged these results.
IMPRESSION: Comparison is made to the prior CT head of [DATE].

No appreciable interval acute infarct.

Redemonstrated subacute infarcts within the anterior left frontal
lobe white matter and left callosal body/genu. Additional known
subacute infarcts within the bilateral cerebral hemispheres were
better appreciated on the brain MRI of [DATE].

Redemonstrated chronic cortical/subcortical infarct within the left
parietal lobe.

Stable background advanced cerebral white matter chronic small
vessel ischemic disease.

Redemonstrated chronic left cerebellar infarct.

Stable mild generalized parenchymal atrophy.

Pansinusitis.

## 2020-06-04 IMAGING — MR MR HEAD WO/W CM
13 series · 48 of 48 positions shown · IV contrast (gadavist)
Comparison: CT angio head and neck [DATE]

CLINICAL DATA: Stroke

EXAM:
MRI HEAD WITHOUT AND WITH CONTRAST
TECHNIQUE: Multiplanar, multiecho pulse sequences of the brain and surrounding
structures were obtained without and with intravenous contrast.
CONTRAST:  10mL GADAVIST GADOBUTROL 1 MMOL/ML IV SOLN

[Series 5: DWI · axial · 3.0mm · 1.36mm/px · z∈[-95,+45]mm · 6 of 96 slices shown (1 of 2)]
[im 1/96]
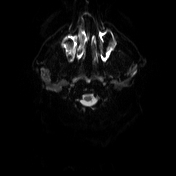
[im 20/96]
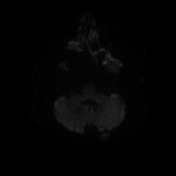
[im 39/96]
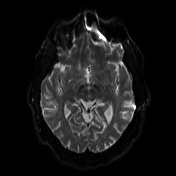
[im 58/96]
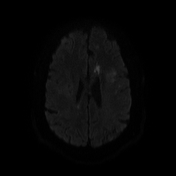
[im 77/96]
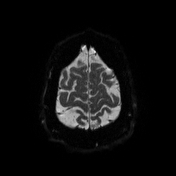
[im 96/96]
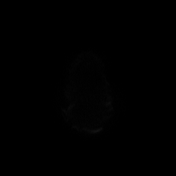

[Series 6: DWI · axial · 3.0mm · 1.36mm/px · z∈[-95,+45]mm · 3 of 48 slices shown (2 of 2)]
[im 1/48]
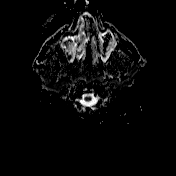
[im 24/48]
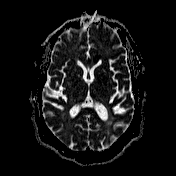
[im 48/48]
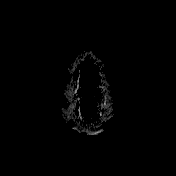

[Series 7: T1 · sagittal · 5.0mm · 0.75mm/px · 1 of 24 slices shown (1 of 2)]
[im 1/24]
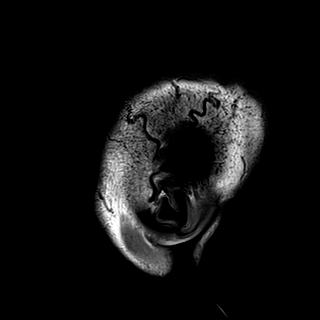

[Series 8: T2 · axial · 5.0mm · 0.62mm/px · z∈[-106,+55]mm · 2 of 26 slices shown]
[im 1/26]
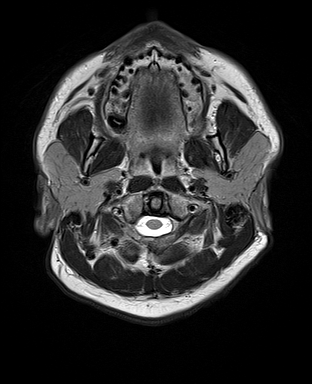
[im 26/26]
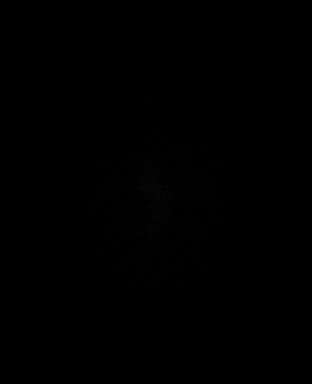

[Series 10: swi_images · axial · 3.0mm · 0.94mm/px · z∈[-113,+63]mm · 4 of 60 slices shown]
[im 1/60]
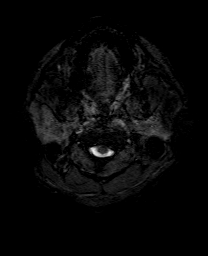
[im 20/60]
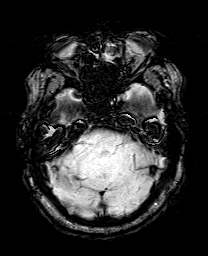
[im 40/60]
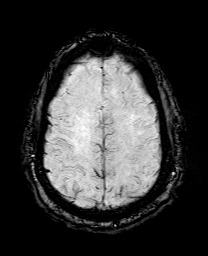
[im 60/60]
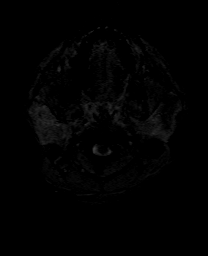

[Series 11: FLAIR · axial · 3.0mm · 0.94mm/px · z∈[-101,+51]mm · 3 of 52 slices shown]
[im 1/52]
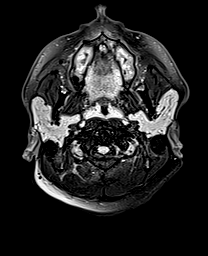
[im 26/52]
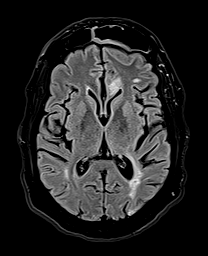
[im 52/52]
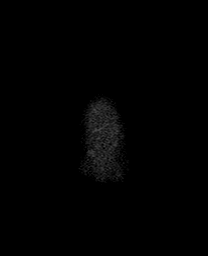

[Series 12: T1 · axial · 1.0mm · 0.94mm/px · z∈[-96,+46]mm · 9 of 144 slices shown (2 of 2)]
[im 1/144]
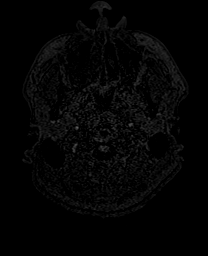
[im 18/144]
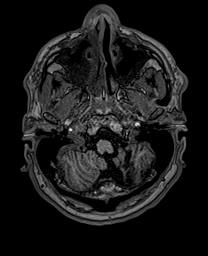
[im 36/144]
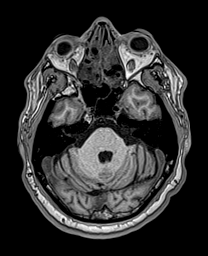
[im 54/144]
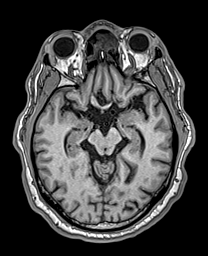
[im 72/144]
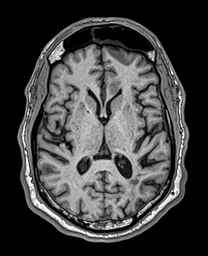
[im 90/144]
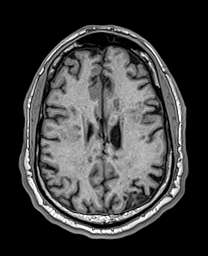
[im 108/144]
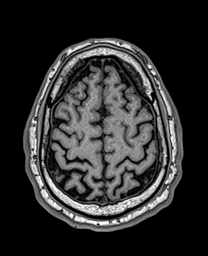
[im 126/144]
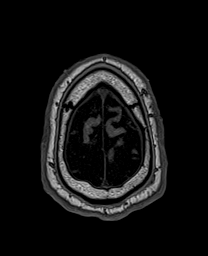
[im 144/144]
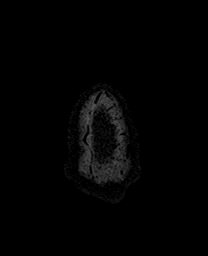

[Series 13: cor dwi_tracew · coronal · 5.0mm · 1.53mm/px · 4 of 69 slices shown]
[im 1/69]
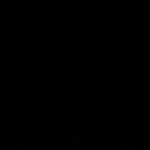
[im 23/69]
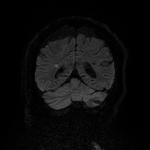
[im 46/69]
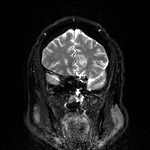
[im 69/69]
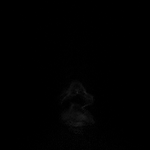

[Series 14: cor dwi_adc · coronal · 5.0mm · 1.53mm/px · 2 of 33 slices shown]
[im 1/33]
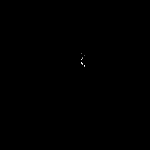
[im 33/33]
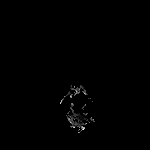

[Series 15: T2 post-contrast · coronal · 5.0mm · 0.86mm/px · 2 of 32 slices shown]
[im 1/32]
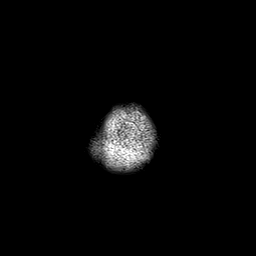
[im 32/32]
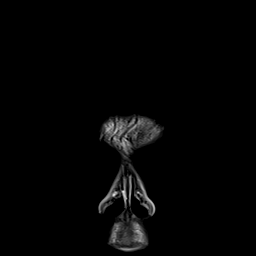

[Series 16: T1 post-contrast · axial · 1.0mm · 0.94mm/px · z∈[-96,+46]mm · 9 of 144 slices shown (1 of 3)]
[im 1/144]
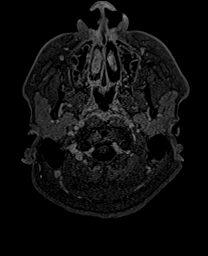
[im 18/144]
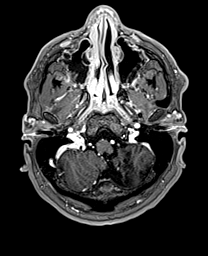
[im 36/144]
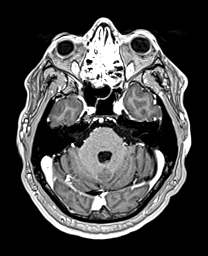
[im 54/144]
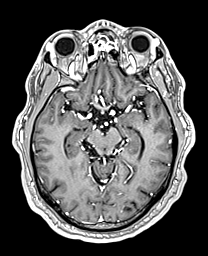
[im 72/144]
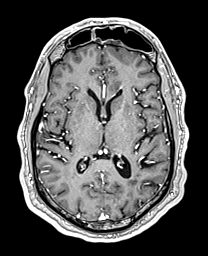
[im 90/144]
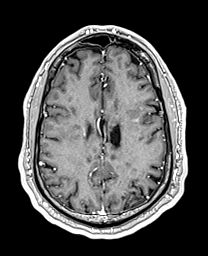
[im 108/144]
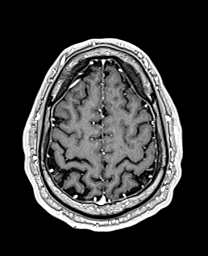
[im 126/144]
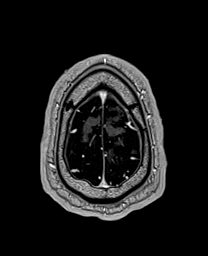
[im 144/144]
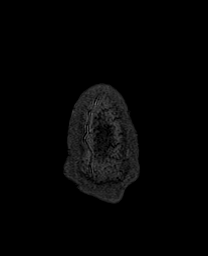

[Series 17: T1 post-contrast · coronal · 5.0mm · 0.43mm/px · 2 of 32 slices shown (2 of 3)]
[im 1/32]
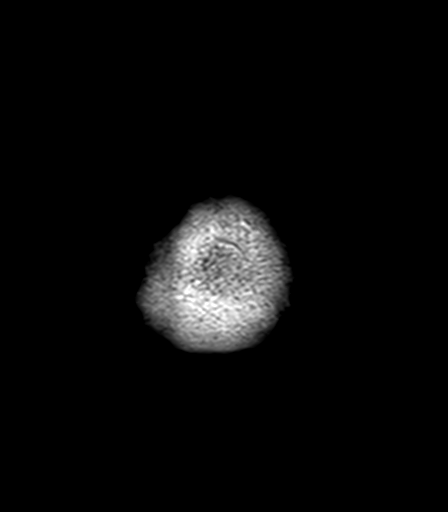
[im 32/32]
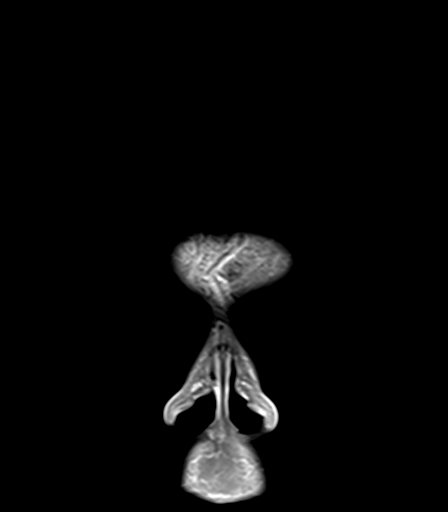

[Series 18: T1 post-contrast · sagittal · 5.0mm · 0.94mm/px · 1 of 24 slices shown (3 of 3)]
[im 1/24]
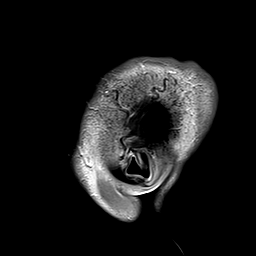

[48 of 48 positions shown; findings below may reference images not displayed]

FINDINGS: Brain: Bilateral recent infarcts in the anterior and middle cerebral
artery territories similar to the recent MRI. Small new areas of
restricted diffusion in the right frontal cortex and in the right
parietal white matter and cortex compatible with interval infarct
since [DATE]. In addition, there is a new area of restricted
diffusion in the left anterior cerebellum. Following contrast
infusion, there is mild enhancement of several subacute infarcts and
occluding the left anterior genu of the corpus callosum, left corona
radiata, and left parietal lobe compatible with subacute infarct.

Ventricle size normal. Chronic hemorrhagic infarct in the left
parietal lobe. This shows a slight amount of enhancement however
there is volume loss in the majority of this appears to be a chronic
infarct. Small area of chronic hemorrhagic infarct in the right
temporoparietal lobe. Moderate chronic microvascular ischemic change
in the white matter. Chronic left cerebellar infarct. Negative for
mass lesion.

Vascular: Normal arterial flow voids.

Skull and upper cervical spine: Negative

Sinuses/Orbits: Extensive mucosal edema throughout the paranasal
sinuses. Air-fluid level right maxillary sinus. Negative orbit

Other: None
IMPRESSION: Multiple areas of subacute infarct in the anterior and middle
cerebral arteries bilaterally as noted on the recent MRI of
[DATE]

Several small areas of acute infarct have developed since the prior
MRI including the right frontal cortex, right parietal white matter
and cortex, and the left cerebellum.

## 2020-06-04 MED ORDER — INSULIN ASPART 100 UNIT/ML IJ SOLN
0.0000 [IU] | Freq: Three times a day (TID) | INTRAMUSCULAR | Status: DC
  Administered 2020-06-04 – 2020-06-05 (×3): 5 [IU] via SUBCUTANEOUS
  Administered 2020-06-05: 8 [IU] via SUBCUTANEOUS
  Administered 2020-06-05 – 2020-06-06 (×2): 5 [IU] via SUBCUTANEOUS
  Administered 2020-06-06 (×2): 8 [IU] via SUBCUTANEOUS
  Administered 2020-06-06: 11 [IU] via SUBCUTANEOUS
  Filled 2020-06-04: qty 0.15

## 2020-06-04 MED ORDER — ONDANSETRON HCL 4 MG/2ML IJ SOLN: 4.0000 mg | Freq: Four times a day (QID) | INTRAMUSCULAR | Status: DC | PRN

## 2020-06-04 MED ORDER — ACETAMINOPHEN 325 MG PO TABS
650.0000 mg | ORAL_TABLET | ORAL | Status: DC | PRN
  Administered 2020-06-06: 650 mg via ORAL
  Filled 2020-06-04: qty 2

## 2020-06-04 MED ORDER — POLYETHYLENE GLYCOL 3350 17 G PO PACK: 17.0000 g | PACK | Freq: Every day | ORAL | Status: DC | PRN

## 2020-06-04 MED ORDER — HYDRALAZINE HCL 20 MG/ML IJ SOLN: 10.0000 mg | Freq: Four times a day (QID) | INTRAMUSCULAR | Status: DC | PRN

## 2020-06-04 MED ORDER — ATORVASTATIN CALCIUM 40 MG PO TABS
80.0000 mg | ORAL_TABLET | Freq: Every day | ORAL | Status: DC
  Administered 2020-06-05 – 2020-06-06 (×2): 80 mg via ORAL
  Filled 2020-06-04 (×2): qty 2

## 2020-06-04 MED ORDER — ACETAMINOPHEN 160 MG/5ML PO SOLN: 650.0000 mg | ORAL | Status: DC | PRN

## 2020-06-04 MED ORDER — LEVETIRACETAM 500 MG PO TABS
500.0000 mg | ORAL_TABLET | Freq: Two times a day (BID) | ORAL | Status: DC
  Administered 2020-06-04 – 2020-06-06 (×5): 500 mg via ORAL
  Filled 2020-06-04 (×5): qty 1

## 2020-06-04 MED ORDER — SODIUM CHLORIDE 0.9 % IV SOLN
1000.0000 mg | Freq: Every day | INTRAVENOUS | Status: DC
  Administered 2020-06-04 – 2020-06-06 (×3): 1000 mg via INTRAVENOUS
  Filled 2020-06-04 (×4): qty 8

## 2020-06-04 MED ORDER — ACETAMINOPHEN 650 MG RE SUPP: 650.0000 mg | RECTAL | Status: DC | PRN

## 2020-06-04 MED ORDER — CLOPIDOGREL BISULFATE 75 MG PO TABS
75.0000 mg | ORAL_TABLET | Freq: Every day | ORAL | Status: DC
  Administered 2020-06-05 – 2020-06-06 (×2): 75 mg via ORAL
  Filled 2020-06-04 (×2): qty 1

## 2020-06-04 MED ORDER — INSULIN GLARGINE 100 UNIT/ML ~~LOC~~ SOLN
10.0000 [IU] | Freq: Every day | SUBCUTANEOUS | Status: DC
  Administered 2020-06-05 – 2020-06-06 (×2): 10 [IU] via SUBCUTANEOUS
  Filled 2020-06-04 (×3): qty 0.1

## 2020-06-04 MED ORDER — ASPIRIN EC 325 MG PO TBEC
325.0000 mg | DELAYED_RELEASE_TABLET | Freq: Every day | ORAL | Status: DC
  Administered 2020-06-05 – 2020-06-06 (×2): 325 mg via ORAL
  Filled 2020-06-04 (×2): qty 1

## 2020-06-04 MED ORDER — IOHEXOL 350 MG/ML SOLN
100.0000 mL | Freq: Once | INTRAVENOUS | Status: AC | PRN
  Administered 2020-06-04: 100 mL via INTRAVENOUS

## 2020-06-04 MED ORDER — GADOBUTROL 1 MMOL/ML IV SOLN
10.0000 mL | Freq: Once | INTRAVENOUS | Status: AC | PRN
  Administered 2020-06-04: 10 mL via INTRAVENOUS

## 2020-06-04 MED ORDER — STROKE: EARLY STAGES OF RECOVERY BOOK
Freq: Once | Status: AC
  Filled 2020-06-04: qty 1

## 2020-06-04 MED ORDER — LIDOCAINE HCL 2 % IJ SOLN
20.0000 mL | Freq: Once | INTRAMUSCULAR | Status: AC
  Administered 2020-06-04: 400 mg
  Filled 2020-06-04: qty 20

## 2020-06-04 MED ORDER — ATORVASTATIN CALCIUM 40 MG PO TABS: 40.0000 mg | ORAL_TABLET | Freq: Every day | ORAL | Status: DC

## 2020-06-04 NOTE — ED Provider Notes (Signed)
.  Lumbar Puncture  Date/Time: 06/04/2020 4:24 PM Performed by: Maxwell Caul, PA-C Authorized by: Maxwell Caul, PA-C   Consent:    Consent obtained:  Verbal   Consent given by:  Patient   Risks, benefits, and alternatives were discussed: yes     Risks discussed:  Bleeding, headache, nerve damage, infection and pain   Alternatives discussed:  No treatment Universal protocol:    Procedure explained and questions answered to patient or proxy's satisfaction: yes     Relevant documents present and verified: yes     Test results available: yes     Imaging studies available: yes     Required blood products, implants, devices, and special equipment available: yes     Immediately prior to procedure a time out was called: yes     Site/side marked: yes     Patient identity confirmed:  Arm band Pre-procedure details:    Procedure purpose:  Diagnostic   Preparation: Patient was prepped and draped in usual sterile fashion   Anesthesia:    Anesthesia method:  Local infiltration   Local anesthetic:  Lidocaine 1% w/o epi Procedure details:    Lumbar space:  L3-L4 interspace   Patient position:  R lateral decubitus   Needle gauge:  18   Needle type:  Spinal needle - Quincke tip   Needle length (in):  3.5   Ultrasound guidance: no     Number of attempts:  2   Opening pressure (cm H2O):  19   Fluid appearance:  Clear   Tubes of fluid:  4   Total volume (ml):  16 Post-procedure details:    Puncture site:  Adhesive bandage applied and direct pressure applied   Procedure completion:  Tolerated      Maxwell Caul, PA-C 06/04/20 1624    Charlynne Pander, MD 06/04/20 2007

## 2020-06-04 NOTE — H&P (Addendum)
History and Physical    Nathaniel Spears XLK:[id number] DOB: [birth year] DOA: 06/04/2020  PCP: Kerin Perna, NP  Patient coming from: Home via EMS   Chief Complaint: Fall, left sided weakness   HPI:    45 year old male with past medical history of diabetes mellitus type 2, hyperlipidemia, hypertension, cocaine abuse, obesity, nicotine dependence and multiple recent strokes requiring hospitalization who presents to [facility] emergency department with complaints of right lower extremity weakness and fall, 24 hours after discharge from [facility] for a recently diagnosed stroke.   Of note, patient was hospitalized at [facility] from 4/5 until 04/23/2020 after presenting with aphasia.  Patient was identified to be suffering from an acute stroke of the left middle frontal lobe.  Patient was initiated on dual antiplatelet therapy and eventually discharged on 4/8.  Patient then presented again to [facility] on 5/15 with symptoms of left lower extremity weakness, dizziness falling and recurrent aphasia.  On this presentation a repeat MRI was obtained demonstrating ischemia in the [place] distribution as well as his previous MCA stroke.  Patient was eventually discharged from [facility] on 5/19.  Shortly after discharge on 5/19 patient presented via EMS back to [facility] emergency department after the family noticed patient was having difficulty with speech and acting strangely.  On that evaluation, case was reviewed with neurology and no further imaging was recommended.  Initiation of Keppra for seizure prophylaxis was recommended however and patient was discharged home.  Today, on 5/20, patient was with family at home in the early afternoon when he suddenly fell while walking.  This was a witnessed fall by the patient's girlfriend.  Patient once again began to exhibit difficulty with speech.  Patient was then brought into [facility] emergency  department via private vehicle for evaluation.  Upon evaluation in the emergency department, patient was noted to have bilateral lower extremity weakness on initial triage exam and therefore a code stroke was activated.  Patient was promptly evaluated by Dr. Cheral Marker with neurology.  A stat CT angiogram of the head and neck was obtained as well as cerebral perfusion imaging and noncontrast CT imaging of the head.  Imaging revealed that many of the areas of cerebrovascular disease were unchanged however it also identified several areas of progressive disease.  Neurology recommended hospitalization for repeat MRI and additional work-up.  They also recommended a vasculitis panel and lumbar puncture.  A lumbar puncture was performed in the emergency department as recommended.  Patient was to be admitted to [facility] however due to the patient and family's dissatisfaction with [facility] they instead requested that the patient be transferred to [facility] for further care.  After the ER provider attempted this and it was unsuccessful, family then agreed for patient to be admitted to [facility] for further management instead of going to [facility]  This was approved by Dr. Cheral Marker with neurology.  The hospitalist group was then called to assess the patient for admission to the hospital.  Review of Systems:   Review of Systems  Unable to perform ROS: Mental acuity    Past Medical History:  Diagnosis Date  . Hypertension   . Stroke [facility])     Past Surgical History:  Procedure Laterality Date  . BUBBLE STUDY  06/01/2020   Procedure: BUBBLE STUDY;  Surgeon: Donato Heinz, MD;  Location: [place];  Service: Cardiovascular;;  . TEE WITHOUT CARDIOVERSION N/A 06/01/2020   Procedure: TRANSESOPHAGEAL ECHOCARDIOGRAM (  TEE);  Surgeon: Donato Heinz, MD;  Location: [facility] ENDOSCOPY;  Service: Cardiovascular;  Laterality: N/A;     reports that he has quit smoking. He has never used smokeless  tobacco. He reports previous alcohol use. He reports that he does not use drugs.  No Known Allergies  Family History  Problem Relation Age of Onset  . Stroke Neg Hx      Prior to Admission medications   Medication Sig Start Date End Date Taking? Authorizing Provider  acetaminophen (TYLENOL) 325 MG tablet Take 2 tablets (650 mg total) by mouth every 4 (four) hours as needed for mild pain (or temp > 37.5 C (99.5 F)). 06/03/20  Yes Sheikh, Omair Latif, DO  amLODipine (NORVASC) 10 MG tablet TAKE 1 TABLET BY MOUTH DAILY. 08/21/19 08/20/20 Yes Kerin Perna, NP  aspirin 325 MG EC tablet Take 1 tablet (325 mg total) by mouth daily. 06/03/20  Yes Sheikh, Omair Latif, DO  atorvastatin (LIPITOR) 40 MG tablet Take 40 mg by mouth daily.   Yes [provider]  clopidogrel (PLAVIX) 75 MG tablet Take 1 tablet (75 mg total) by mouth daily. 05/24/20 06/23/20 Yes Kerin Perna, NP  Insulin Glargine (BASAGLAR KWIKPEN) 100 UNIT/ML Inject 10 Units into the skin daily. 05/18/20  Yes Kerin Perna, NP  losartan-hydrochlorothiazide (HYZAAR) 100-25 MG tablet Take 1 tablet by mouth daily.   Yes [provider]  metFORMIN (GLUCOPHAGE) 1000 MG tablet Take 1 tablet (1,000 mg total) by mouth 2 (two) times daily with a meal. 05/18/20  Yes Kerin Perna, NP  atorvastatin (LIPITOR) 40 MG tablet Take 1 tablet (40 mg total) by mouth daily. 04/24/20 05/27/20  Arrien, Jimmy Picket, MD  blood glucose meter kit and supplies Dispense based on patient and insurance preference. Use up to four times daily as directed. (FOR ICD-10 E10.9, E11.9). 04/23/20   Arrien, Jimmy Picket, MD  Blood Glucose Monitoring Suppl (TRUE METRIX METER) w/Device KIT Use to check blood sugar TID. 05/18/20   Charlott Rakes, MD  glucose blood (TRUE METRIX BLOOD GLUCOSE TEST) test strip Use to check blood sugar TID. 05/18/20   Charlott Rakes, MD  levETIRAcetam (KEPPRA) 500 MG tablet Take 1 tablet (500 mg total) by mouth 2 (two) times  daily. 06/03/20 07/04/20  Alfredia Client, PA-C  TRUEplus Lancets 28G MISC Use to check blood sugar 3 times daily. Patient taking differently: Use to check blood sugar 3 times daily. 05/23/20   Kerin Perna, NP    Physical Exam: Vitals:   06/04/20 1830 06/04/20 2205 06/04/20 2226 06/04/20 2234  BP: (!) 147/95   135/80  Pulse:    75  Resp:    20  Temp:  98.2 F (36.8 C)  98.7 F (37.1 C)  TempSrc:  Oral  Oral  SpO2:    97%  Weight:   110.3 kg   Height:   _0  (1.854 m)     Constitutional: Awake and responsive but intermittently confused, oriented x1, no associated distress.   Skin: no rashes, no lesions, good skin turgor noted. Eyes: Pupils are equally reactive to light.  No evidence of scleral icterus or conjunctival pallor.  ENMT: Moist mucous membranes noted.  Posterior pharynx clear of any exudate or lesions.   Neck: normal, supple, no masses, no thyromegaly.  No evidence of jugular venous distension.   Respiratory: clear to auscultation bilaterally, no wheezing, no crackles. Normal respiratory effort. No accessory muscle use.  Cardiovascular: Regular rate and rhythm, no murmurs /  rubs / gallops. No extremity edema. 2+ pedal pulses. No carotid bruits.  Chest:   Nontender without crepitus or deformity.   Back:   Nontender without crepitus or deformity. Abdomen: Abdomen is soft and nontender.  No evidence of intra-abdominal masses.  Positive bowel sounds noted in all quadrants.   Musculoskeletal: No joint deformity upper and lower extremities. Good ROM, no contractures. Normal muscle tone.  Neurologic: Patient intermittently following commands.  Patient intermittently confused throughout the interview.  Patient seems to exhibit a left-sided hemineglect.  Abnormal cerebellar function testing bilaterally.  Cranial nerves II through XII are seemingly grossly intact.  Patient is responsive to verbal and painful stimuli.   Psychiatric: Patient exhibits flat affect.  Due to confusion  patient currently does not seem to possess insight as to his current situation.     Labs on Admission: I have personally reviewed following labs and imaging studies -   CBC: Recent Labs  Lab 05/31/20 0330 06/01/20 0059 06/02/20 0038 06/03/20 0129 06/03/20 1540 06/04/20 1352 06/04/20 1502  WBC 6.9 6.8 7.4 6.8 7.5 7.4  --   NEUTROABS 3.7 3.5 4.3 4.0  --  4.7  --   HGB 11.4* 11.8* 12.2* 11.9* 12.3* 12.3* 18.4*  HCT 35.1* 36.8* 37.6* 36.5* 38.6* 38.5* 54.0*  MCV 80.0 79.8* 79.0* 78.5* 81.1 80.2  --   PLT 174 200 222 232 239 282  --    Basic Metabolic Panel: Recent Labs  Lab 05/31/20 0330 06/01/20 0059 06/02/20 0038 06/03/20 0129 06/03/20 1540 06/04/20 1352 06/04/20 1502  NA 140 140 139 138 140 141 143  K 3.0* 3.3* 3.5 3.8 3.9 3.7 3.7  CL 107 108 105 106 108 104 102  CO2 _0 --   GLUCOSE 120* 117* 99 98 104* 152* 141*  BUN _1 CREATININE 1.17 1.12 1.24 1.22 1.47* 1.36* 1.40*  CALCIUM 8.2* 8.5* 8.7* 8.7* 8.8* 9.2  --   MG 1.5* 1.8 1.6* 1.9  --   --   --    GFR: Estimated Creatinine Clearance: 86.8 mL/min (A) (by C-G formula based on SCr of 1.4 mg/dL (H)). Liver Function Tests: Recent Labs  Lab 05/30/20 1326 06/03/20 1540 06/04/20 1352  AST 14* 13* 15  ALT _2 ALKPHOS 69 62 67  BILITOT 0.4 0.7 0.6  PROT 7.3 7.2 8.4*  ALBUMIN 3.7 3.2* 3.9   No results for input(s): LIPASE, AMYLASE in the last 168 hours. Recent Labs  Lab 05/30/20 1328  AMMONIA 38*   Coagulation Profile: Recent Labs  Lab 05/30/20 1326 06/04/20 1352  INR 1.1 1.0   Cardiac Enzymes: No results for input(s): CKTOTAL, CKMB, CKMBINDEX, TROPONINI in the last 168 hours. BNP (last 3 results) No results for input(s): PROBNP in the last 8760 hours. HbA1C: No results for input(s): HGBA1C in the last 72 hours. CBG: Recent Labs  Lab 06/02/20 2344 06/03/20 0800 06/03/20 1119 06/04/20 1341 06/04/20 2250  GLUCAP 97 102* 149* 150* 227*   Lipid Profile: No  results for input(s): CHOL, HDL, LDLCALC, TRIG, CHOLHDL, LDLDIRECT in the last 72 hours. Thyroid Function Tests: No results for input(s): TSH, T4TOTAL, FREET4, T3FREE, THYROIDAB in the last 72 hours. Anemia Panel: Recent Labs    06/03/20 0129  VITAMINB12 349  FOLATE 19.8  FERRITIN 302  TIBC 301  IRON 26*  RETICCTPCT 0.8   Urine analysis:    Component Value Date/Time   COLORURINE YELLOW 05/30/2020 1644  APPEARANCEUR CLEAR 05/30/2020 1644   LABSPEC 1.016 05/30/2020 1644   PHURINE 7.0 05/30/2020 1644   GLUCOSEU >=500 (A) 05/30/2020 1644   HGBUR NEGATIVE 05/30/2020 [place] 05/30/2020 [place] 05/30/2020 1644   PROTEINUR NEGATIVE 05/30/2020 1644   NITRITE NEGATIVE 05/30/2020 [place] 05/30/2020 1644    Radiological Exams on Admission - Personally Reviewed: CT Angio Head W or Wo Contrast  Result Date: 06/04/2020 CLINICAL DATA:  Neuro deficit, acute, stroke suspected; recent LVO. Additional provided: Right leg weakness. Fall today. EXAM: CT ANGIOGRAPHY HEAD AND NECK CT PERFUSION BRAIN TECHNIQUE: Multidetector CT imaging of the head and neck was performed using the standard protocol during bolus administration of intravenous contrast. Multiplanar CT image reconstructions and MIPs were obtained to evaluate the vascular anatomy. Carotid stenosis measurements (when applicable) are obtained utilizing NASCET criteria, using the distal internal carotid diameter as the denominator. Multiphase CT imaging of the brain was performed following IV bolus contrast injection. Subsequent parametric perfusion maps were calculated using RAPID software. CONTRAST:  116m OMNIPAQUE IOHEXOL 350 MG/ML SOLN COMPARISON:  Prior CT angiogram head/neck 05/31/2020. FINDINGS: CTA NECK FINDINGS Aortic arch: Aberrant right subclavian artery. The visualized aortic arch is normal in caliber. No hemodynamically significant innominate or proximal subclavian artery  stenosis. Right carotid system: CCA and ICA patent within the neck without stenosis. No significant atherosclerotic disease. Left carotid system: CCA and ICA patent within the neck without stenosis. No significant atherosclerotic disease. Vertebral arteries: Streak and beam hardening artifact arising from venous reflux limits evaluation of the V1 left vertebral artery. Within this limitation, the vertebral arteries are patent within the neck without hemodynamically significant stenosis. Skeleton: Cervical spondylosis. No acute bony abnormality or aggressive osseous lesion. Other neck: No neck mass or cervical lymphadenopathy. Upper chest: No consolidation within the imaged lung apices. Review of the MIP images confirms the above findings CTA HEAD FINDINGS Anterior circulation: The intracranial internal carotid arteries are patent. The M1 middle cerebral arteries are patent. Interval progression of a severe stenosis within a proximal left M2 MCA vessel (series 12, image 28). Multiple additional previously demonstrated severe stenoses within the bilateral M2 and M3 vessels. Hypoplastic right A1 segment. Unchanged appearance of a focal occlusion versus severe stenosis within the A2 left anterior cerebral artery (series 12, image 21). Progressive irregularity with multiple progressive severe stenoses within the left ACA vascular tree distal to this (series 12, image 21). No intracranial aneurysm is identified. Posterior circulation: The intracranial vertebral arteries are patent. The basilar artery is patent. The posterior cerebral arteries are patent. Redemonstrated mild/moderate stenosis within the proximal P2 left PCA (series 10, image 60). Posterior communicating arteries are hypoplastic or absent bilaterally. Venous sinuses: Within the limitations of contrast timing, no convincing thrombus. Anatomic variants: As described Review of the MIP images confirms the above findings CT Brain Perfusion Findings: CBF (<30%)  Volume: 069mPerfusion (Tmax>6.0s) volume: 7760mincluding areas within the left ACA vascular territory and right MCA vascular territory). Mismatch Volume: 77 mL Infarction Location:None identified These results were called by telephone at the time of interpretation on 06/04/2020 at 2:55 pm to provider Dr. LinCheral Markerho verbally acknowledged these results. IMPRESSION: CTA neck: 1. The common carotid, internal carotid and vertebral arteries are patent within the neck without hemodynamically significant stenosis. 2. Aberrant right subclavian artery. CTA head: 1. Focal occlusion versus severe stenosis within the A2 left anterior cerebral artery, similar in appearance as compared to the CTA of 05/31/2020. Progressive vessel  irregularity with multiple progressive severe stenoses within the left ACA vascular tree distal to this. 2. Progressive severe focal stenosis within a proximal left M2 MCA vessel. 3. Multiple additional previously demonstrated severe stenoses within the bilateral M2 and M3 MCA vessels. 4. Mild/moderate stenosis within the proximal P2 left PCA. 5. The above stenoses may be secondary to vasculitis and/or atherosclerotic disease. CT perfusion head: No core infarct is identified. Areas of hypoperfusion totaling 77 mL within the left ACA vascular territory and right MCA vascular territory (utilizing the Tmax>6 seconds threshold). Electronically Signed   By: Kellie Simmering DO   On: 06/04/2020 15:16   CT HEAD WO CONTRAST  Result Date: 06/03/2020 CLINICAL DATA:  Transient ischemic attack. EXAM: CT HEAD WITHOUT CONTRAST TECHNIQUE: Contiguous axial images were obtained from the base of the skull through the vertex without intravenous contrast. COMPARISON:  May 30, 2020. FINDINGS: Brain: Mild chronic ischemic white matter disease is noted. Old left posterior parietal infarction is noted. Stable left frontal low density is noted consistent with acute to subacute infarction as noted on prior MRI. No hemorrhage is  noted. No mass effect or midline shift is noted. Ventricular size is within normal limits. Old left cerebellar infarction is noted. Vascular: No hyperdense vessel or unexpected calcification. Skull: Normal. Negative for fracture or focal lesion. Sinuses/Orbits: Bilateral ethmoid and maxillary sinusitis is noted. Other: None. IMPRESSION: Stable left frontal low density is noted consistent with acute to subacute infarction as noted on recent MRI. Old left cerebellar and posterior parietal infarctions are noted. No significant changes noted compared to prior exam. Electronically Signed   By: Marijo Conception M.D.   On: 06/03/2020 16:16   CT Angio Neck W and/or Wo Contrast  Result Date: 06/04/2020 CLINICAL DATA:  Neuro deficit, acute, stroke suspected; recent LVO. Additional provided: Right leg weakness. Fall today. EXAM: CT ANGIOGRAPHY HEAD AND NECK CT PERFUSION BRAIN TECHNIQUE: Multidetector CT imaging of the head and neck was performed using the standard protocol during bolus administration of intravenous contrast. Multiplanar CT image reconstructions and MIPs were obtained to evaluate the vascular anatomy. Carotid stenosis measurements (when applicable) are obtained utilizing NASCET criteria, using the distal internal carotid diameter as the denominator. Multiphase CT imaging of the brain was performed following IV bolus contrast injection. Subsequent parametric perfusion maps were calculated using RAPID software. CONTRAST:  14m OMNIPAQUE IOHEXOL 350 MG/ML SOLN COMPARISON:  Prior CT angiogram head/neck 05/31/2020. FINDINGS: CTA NECK FINDINGS Aortic arch: Aberrant right subclavian artery. The visualized aortic arch is normal in caliber. No hemodynamically significant innominate or proximal subclavian artery stenosis. Right carotid system: CCA and ICA patent within the neck without stenosis. No significant atherosclerotic disease. Left carotid system: CCA and ICA patent within the neck without stenosis. No  significant atherosclerotic disease. Vertebral arteries: Streak and beam hardening artifact arising from venous reflux limits evaluation of the V1 left vertebral artery. Within this limitation, the vertebral arteries are patent within the neck without hemodynamically significant stenosis. Skeleton: Cervical spondylosis. No acute bony abnormality or aggressive osseous lesion. Other neck: No neck mass or cervical lymphadenopathy. Upper chest: No consolidation within the imaged lung apices. Review of the MIP images confirms the above findings CTA HEAD FINDINGS Anterior circulation: The intracranial internal carotid arteries are patent. The M1 middle cerebral arteries are patent. Interval progression of a severe stenosis within a proximal left M2 MCA vessel (series 12, image 28). Multiple additional previously demonstrated severe stenoses within the bilateral M2 and M3 vessels. Hypoplastic right A1 segment.  Unchanged appearance of a focal occlusion versus severe stenosis within the A2 left anterior cerebral artery (series 12, image 21). Progressive irregularity with multiple progressive severe stenoses within the left ACA vascular tree distal to this (series 12, image 21). No intracranial aneurysm is identified. Posterior circulation: The intracranial vertebral arteries are patent. The basilar artery is patent. The posterior cerebral arteries are patent. Redemonstrated mild/moderate stenosis within the proximal P2 left PCA (series 10, image 60). Posterior communicating arteries are hypoplastic or absent bilaterally. Venous sinuses: Within the limitations of contrast timing, no convincing thrombus. Anatomic variants: As described Review of the MIP images confirms the above findings CT Brain Perfusion Findings: CBF (<30%) Volume: 60m Perfusion (Tmax>6.0s) volume: 734m(including areas within the left ACA vascular territory and right MCA vascular territory). Mismatch Volume: 77 mL Infarction Location:None identified These  results were called by telephone at the time of interpretation on 06/04/2020 at 2:55 pm to provider Dr. LiCheral MarkerWho verbally acknowledged these results. IMPRESSION: CTA neck: 1. The common carotid, internal carotid and vertebral arteries are patent within the neck without hemodynamically significant stenosis. 2. Aberrant right subclavian artery. CTA head: 1. Focal occlusion versus severe stenosis within the A2 left anterior cerebral artery, similar in appearance as compared to the CTA of 05/31/2020. Progressive vessel irregularity with multiple progressive severe stenoses within the left ACA vascular tree distal to this. 2. Progressive severe focal stenosis within a proximal left M2 MCA vessel. 3. Multiple additional previously demonstrated severe stenoses within the bilateral M2 and M3 MCA vessels. 4. Mild/moderate stenosis within the proximal P2 left PCA. 5. The above stenoses may be secondary to vasculitis and/or atherosclerotic disease. CT perfusion head: No core infarct is identified. Areas of hypoperfusion totaling 77 mL within the left ACA vascular territory and right MCA vascular territory (utilizing the Tmax>6 seconds threshold). Electronically Signed   By: KyKellie SimmeringO   On: 06/04/2020 15:16   MR BRAIN W WO CONTRAST  Result Date: 06/04/2020 CLINICAL DATA:  Stroke EXAM: MRI HEAD WITHOUT AND WITH CONTRAST TECHNIQUE: Multiplanar, multiecho pulse sequences of the brain and surrounding structures were obtained without and with intravenous contrast. CONTRAST:  1022mADAVIST GADOBUTROL 1 MMOL/ML IV SOLN COMPARISON:  CT angio head and neck 06/04/2020 FINDINGS: Brain: Bilateral recent infarcts in the anterior and middle cerebral artery territories similar to the recent MRI. Small new areas of restricted diffusion in the right frontal cortex and in the right parietal white matter and cortex compatible with interval infarct since 05/30/2020. In addition, there is a new area of restricted diffusion in the left  anterior cerebellum. Following contrast infusion, there is mild enhancement of several subacute infarcts and occluding the left anterior genu of the corpus callosum, left corona radiata, and left parietal lobe compatible with subacute infarct. Ventricle size normal. 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 This shows a slight amount of enhancement however there is volume loss in the majority of this appears to be a chronic infarct. Small area of chronic hemorrhagic infarct in the right temporoparietal lobe. Moderate chronic microvascular ischemic change in the white matter. Chronic left cerebellar infarct. Negative for mass lesion. Vascular: Normal arterial flow voids. Skull and upper cervical spine: Negative Sinuses/Orbits: Extensive mucosal edema throughout the paranasal sinuses. Air-fluid level right maxillary sinus. Negative orbit Other: None IMPRESSION: Multiple areas of subacute infarct in the anterior and middle cerebral arteries bilaterally as noted on the recent MRI of 05/30/2020 Several small areas of acute infarct have developed since the prior MRI  including the right frontal cortex, right parietal white matter and cortex, and the left cerebellum. Electronically Signed   By: Franchot Gallo M.D.   On: 06/04/2020 17:55   CT CEREBRAL PERFUSION W CONTRAST  Result Date: 06/04/2020 CLINICAL DATA:  Neuro deficit, acute, stroke suspected; recent LVO. Additional provided: Right leg weakness. Fall today. EXAM: CT ANGIOGRAPHY HEAD AND NECK CT PERFUSION BRAIN TECHNIQUE: Multidetector CT imaging of the head and neck was performed using the standard protocol during bolus administration of intravenous contrast. Multiplanar CT image reconstructions and MIPs were obtained to evaluate the vascular anatomy. Carotid stenosis measurements (when applicable) are obtained utilizing NASCET criteria, using the distal internal carotid diameter as the denominator. Multiphase CT imaging of the brain was performed  following IV bolus contrast injection. Subsequent parametric perfusion maps were calculated using RAPID software. CONTRAST:  167m OMNIPAQUE IOHEXOL 350 MG/ML SOLN COMPARISON:  Prior CT angiogram head/neck 05/31/2020. FINDINGS: CTA NECK FINDINGS Aortic arch: Aberrant right subclavian artery. The visualized aortic arch is normal in caliber. No hemodynamically significant innominate or proximal subclavian artery stenosis. Right carotid system: CCA and ICA patent within the neck without stenosis. No significant atherosclerotic disease. Left carotid system: CCA and ICA patent within the neck without stenosis. No significant atherosclerotic disease. Vertebral arteries: Streak and beam hardening artifact arising from venous reflux limits evaluation of the V1 left vertebral artery. Within this limitation, the vertebral arteries are patent within the neck without hemodynamically significant stenosis. Skeleton: Cervical spondylosis. No acute bony abnormality or aggressive osseous lesion. Other neck: No neck mass or cervical lymphadenopathy. Upper chest: No consolidation within the imaged lung apices. Review of the MIP images confirms the above findings CTA HEAD FINDINGS Anterior circulation: The intracranial internal carotid arteries are patent. The M1 middle cerebral arteries are patent. Interval progression of a severe stenosis within a proximal left M2 MCA vessel (series 12, image 28). Multiple additional previously demonstrated severe stenoses within the bilateral M2 and M3 vessels. Hypoplastic right A1 segment. Unchanged appearance of a focal occlusion versus severe stenosis within the A2 left anterior cerebral artery (series 12, image 21). Progressive irregularity with multiple progressive severe stenoses within the left ACA vascular tree distal to this (series 12, image 21). No intracranial aneurysm is identified. Posterior circulation: The intracranial vertebral arteries are patent. The basilar artery is patent. The  posterior cerebral arteries are patent. Redemonstrated mild/moderate stenosis within the proximal P2 left PCA (series 10, image 60). Posterior communicating arteries are hypoplastic or absent bilaterally. Venous sinuses: Within the limitations of contrast timing, no convincing thrombus. Anatomic variants: As described Review of the MIP images confirms the above findings CT Brain Perfusion Findings: CBF (<30%) Volume: 06mPerfusion (Tmax>6.0s) volume: 7787mincluding areas within the left ACA vascular territory and right MCA vascular territory). Mismatch Volume: 77 mL Infarction Location:None identified These results were called by telephone at the time of interpretation on 06/04/2020 at 2:55 pm to provider Dr. LinCheral Markerho verbally acknowledged these results. IMPRESSION: CTA neck: 1. The common carotid, internal carotid and vertebral arteries are patent within the neck without hemodynamically significant stenosis. 2. Aberrant right subclavian artery. CTA head: 1. Focal occlusion versus severe stenosis within the A2 left anterior cerebral artery, similar in appearance as compared to the CTA of 05/31/2020. Progressive vessel irregularity with multiple progressive severe stenoses within the left ACA vascular tree distal to this. 2. Progressive severe focal stenosis within a proximal left M2 MCA vessel. 3. Multiple additional previously demonstrated severe stenoses within the bilateral M2 and M3  MCA vessels. 4. Mild/moderate stenosis within the proximal P2 left PCA. 5. The above stenoses may be secondary to vasculitis and/or atherosclerotic disease. CT perfusion head: No core infarct is identified. Areas of hypoperfusion totaling 77 mL within the left ACA vascular territory and right MCA vascular territory (utilizing the Tmax>6 seconds threshold). Electronically Signed   By: Kellie Simmering DO   On: 06/04/2020 15:16   CT HEAD CODE STROKE WO CONTRAST  Result Date: 06/04/2020 CLINICAL DATA:  Code stroke. Neuro deficit,  acute, stroke suspected. Additional history provided: Last known normal 1:15 p.m., weakness, fall in part today, 3 recent strokes. EXAM: CT HEAD WITHOUT CONTRAST TECHNIQUE: Contiguous axial images were obtained from the base of the skull through the vertex without intravenous contrast. COMPARISON:  CT angiogram head/neck 05/31/2020. Brain MRI 05/30/2020. FINDINGS: Brain: Mild cerebral and cerebellar atrophy. No interval acute demarcated cortical infarct is identified. No evidence of acute intracranial hemorrhage. Redemonstrated subacute infarction changes within the anterior left frontal lobe white matter and left callosal body/genu. Additional known subacute infarcts within the bilateral cerebral hemispheres were better appreciated on the brain MRI of 05/30/2020. Redemonstrated chronic cortical/subcortical infarct within the left parietal lobe. Stable background advanced chronic small vessel ischemic disease within the cerebral white matter, including multiple chronic small-vessel infarcts within the bilateral cerebral white matter. Redemonstrated chronic infarct within the left cerebellum. No extra-axial fluid collection. No evidence of intracranial mass. No midline shift. Vascular: No hyperdense vessel. Skull: Normal. Negative for fracture or focal lesion. Sinuses/Orbits: Visualized orbits show no acute finding. Pansinusitis. Most notably, there is severe mucosal thickening within the left frontal sinus, within the bilateral ethmoid air cells and within the bilateral maxillary sinuses. Superimposed scattered small volume frothy secretions. ASPECTS [facility] Stroke Program Early CT Score) - Ganglionic level infarction (caudate, lentiform nuclei, internal capsule, insula, M1-M3 cortex): 7 - Supraganglionic infarction (M4-M6 cortex): 3 Total score (0-10 with 10 being normal): 10 (when discounting subacute and chronic infarcts). These results were called by telephone at the time of interpretation on 06/04/2020 at 2:08  pm to provider DAVID YAO , who verbally acknowledged these results. IMPRESSION: Comparison is made to the prior CT head of 05/31/2020. No appreciable interval acute infarct. Redemonstrated subacute infarcts within the anterior left frontal lobe white matter and left callosal body/genu. Additional known subacute infarcts within the bilateral cerebral hemispheres were better appreciated on the brain MRI of 05/30/2020. Redemonstrated chronic cortical/subcortical infarct within the left parietal lobe. Stable background advanced cerebral white matter chronic small vessel ischemic disease. Redemonstrated chronic left cerebellar infarct. Stable mild generalized parenchymal atrophy. Pansinusitis. Electronically Signed   By: Kellie Simmering DO   On: 06/04/2020 14:09    EKG: Personally reviewed.  Rhythm is normal sinus rhythm with heart rate of 93 bpm.  No dynamic ST segment changes appreciated.  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   Patient presenting with further neurologic deficits after already being hospitalized twice within the past several weeks for strokes involving the left MCA and ACA distribution  Patient and family report that he is compliant with his home regimen of aspirin, Plavix and statin therapy  Patient reports that he is now abstinent of cocaine use and has quit smoking  Noncontrast CT head with CTA and perfusion imaging here in the emergency department concerning for progressive cerebrovascular process with possible acute or subacute strokes -overall  processes concerning for possible underlying vasculitis/autoimmune disease or hypercoagulable state.  Already evaluated the patient and is recommended hospitalization and obtaining a repeat MRI and lumbar puncture (already performed, labs  pending)  Additionally obtaining panel for hypercoagulable state, panel for SLE  Neurology is also recommended initiation of high-dose pulse steroid treatment with 1000 mg of Solu-Medrol daily for 5 days  Performing serial neurologic checks  Monitoring patient on telemetry  Continuing previously prescribed regimen of aspirin and Plavix  Continue statin therapy  Inflammatory markers and lupus panel as well as urine toxicology screen additionally obtained  PT, OT, SLP evaluation ordered  Family states that they are continuing to work on finding a [facility] bed for this patient.  I informed them that if they are able to reserve a bed for Mr. Schrecengost and [facility] is willing to accept the patient for transfer then we will work to get him there.  Patient is currently being admitted to [facility] in the meantime per family request.  This request has been cleared with Dr. Cheral Marker with neurology  Active Problems:   Essential hypertension   Permissive hypertension for at least 24 hours per neurology recommendations  Providing as needed intravenous antibiotics for markedly elevated blood pressures in excess of 992 systolic    Type 2 diabetes mellitus with hyperglycemia, with long-term current use of insulin ([place])   Accu-Cheks before every meal and nightly with sliding scale insulin  Continue home regimen of Lantus 10 units daily  Recent hemoglobin A1c performed found to be 10.3% on 5/16  Adjust insulin regimen and transition to basal bolus insulin therapy if hypoglycemia persists.    Nicotine dependence   Patient reports having quit smoking recently  Continuing to counsel on cessation daily.    Cocaine use   Patient reports having quit recently.  Continuing to counsel on cessation daily.   Code Status:  Full code Family Communication: Sister and girlfriend at the bedside and have been updated on plan of care.  Status is: Observation  The patient remains OBS appropriate  and will d/c before 2 midnights.  Dispo:  Patient From: Home  Planned Disposition: Home  Medically stable for discharge: No       Vernelle Emerald MD [facility] Pager [phone]  If 7PM-7AM, please contact night-coverage [email or web address] Use universal [place] password for that web site. If you do not have the password, please call the hospital operator.  06/04/2020, 11:11 PM

## 2020-06-04 NOTE — Progress Notes (Signed)
Received a call from EDP to admit the patient.  Went to the patient's room in the ED at [facility].  Patient is accompanied by significant other.  Patient and significant other declined to be admitted to the hospital.  Per significant other, they want to go to [facility] and they are waiting for the patient's sister to arrive in the ED.  Discussed with EDP, Dr. Silverio Lay, that the patient is refusing to be admitted.

## 2020-06-04 NOTE — Consult Note (Addendum)
[facility] TeleNeurology Consult Services    Date of Service:  06/04/2020    Metrics: Last Known Well: 7482 Symptoms: As per HPI.  Patient is not a candidate for thrombolytic due to recent strokes.   Location of the provider: [facility]  Location of the patient: [facility]  mRS: 1  This consult was provided via telemedicine with 2-way video and audio communication. The patient/family was informed that care would be provided in this way and agreed to receive care in this manner.   ED Physician notified of diagnostic impression and management plan immediately following this consultation:   Assessment: 44 year old male with recurrent strokes, discharged from [facility] yesterday, who re-presents with acute onset of RLE weakness noticed immediately after a fall  - Exam reveals expressive and receptive dysphasia in conjunction with left facial droop and RLE dystaxia. NIHSS of 7.  - STAT CT head:Comparison is made to the prior CT head of 05/31/2020. No appreciable interval acute infarct. Redemonstrated subacute infarcts within the anterior left frontal lobe white matter and left callosal body/genu. Additional known subacute infarcts within the bilateral cerebral hemispheres were better appreciated on the brain MRI of 05/30/2020. Redemonstrated chronic cortical/subcortical infarct within the left parietal lobe. Stable background advanced cerebral white matter chronic small vessel ischemic disease. Redemonstrated chronic left cerebellar infarct. Stable mild generalized parenchymal atrophy. - STAT CTA of neck: The common carotid, internal carotid and vertebral arteries are patent within the neck without hemodynamically significant stenosis. Aberrant right subclavian artery is noted. - CTA head: Focal occlusion versus severe stenosis within the A2 left anterior cerebral artery, similar in appearance as compared to the CTA of 05/31/2020. Progressive vessel irregularity with  multiple progressive severe stenoses within the left ACA vascular tree distal to this. Progressive severe focal stenosis within a proximal left M2 MCA vessel. Multiple additional previously demonstrated severe stenoses within the bilateral M2 and M3 MCA vessels. Mild/moderate stenosis within the proximal P2 left PCA. The above stenoses may be secondary to vasculitis and/or atherosclerotic disease. - CT perfusion head: No core infarct is identified. Areas of hypoperfusion totaling 77 mL within the left ACA vascular territory and right MCA vascular territory (utilizing the Tmax>6 seconds threshold). - HgbA1c on 5/16 was 10.3 - Recent lipid panel normal except for decreased HDL - Last ESR from 4/6 was 4 - Last UTOX from 5/15 was negative    Recommendations: - Admit to [facility] for repeat MRI and additional work up for recurrent strokes, including TIA vs stroke recurrence today.  - UTOX - ESR and C-reactive protein - Lupus panel - Vasculitis panel send out to [facility] - Hypercoagulable panel - STAT LP. Labs to include cell count with differential, protein, glucose, VDRL, IgG index, OCBs,  VZV PCR and VZV IgM - MRI brain with and without contrast (ordered) - Empiric pulsed-dose steroid treatment with IV Solumedrol 1000 mg qd x 5 days (ordered) - After steroids are completed, obtain repeat vascular imaging to assess for possible radiographic improvement of the multifocal stenoses - VZV IgM on serum (ordered) - The patient is medically unstable from a stroke standpoint given several recurrent strokes and TIAs in a short time frame, with interval worsening of angiographic findings over a period of 4 days. The patient is at high risk for stroke recurrence, possibly with severe long-term disability or death as outcomes. - Patient may not be able to give informed consents for refusal of admission/treatment given that he is aphasic. May need to rely on  a family member who is HPOA for this.   - Continue ASA and  Plavix.  - Permissive HTN x 24 hours. Treat SBP if > 220.  -Case discussed with Dr. Estanislado Pandy and Dr. Rory Percy.     ------------------------------------------------------------------------------   History of Present Illness: [facility] is a 45 year old with recent strokes, hypertension, substance abuse, diabetes and obesity who was recently discharged from [facility] on 5/19 after evaluation and management for an acute strokes in the bilateral MCA and ACA territories. He re-presented to the ED yesterday afternoon about 2 hours after his discharge for assessment of transient verbal unresponsiveness and inability to move. He was evaluated in the ED and seizure prophylaxis with Keppra 500 mg BID following an IV load was recommended; per EDP note, no further work up was indicated at that time. He was discharged home and instructed to continue ASA and Plavix in addition to the [place].    He returns today after a fall while walking with associated right lower extremity weakness noticed immediately afterwards. [facility] EDP noted no effort of RLE against gravity and left leg with minor resistance against gravity. Code Stroke was then activated.   Per the patient's discharge summary from yesterday: "The patient is an obese African-American male with a past medical history significant for but not limited to hypertension, cocaine abuse, diabetes mellitus type 2, obesity as well as a recent CVA who was just discharged on 04/23/2020 who presented with worsening left lower extremity weakness, dizziness, falling and ongoing aphasia. 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 He was hospitalized for further management and seen by neurology. He underwent TEE on 06/01/2020 which showed no cardiac source of embolism seen and negative bubble study. Today on  06/02/2020 he underwent a cerebral arteriogram which showed multiple vessel arthrosclerosis but no evidence of vasculitis. Neurology recommends continuing lifestyle modification as well as compliance with medications. He is now on aspirin 325 daily as well as Plavix 75 mg daily as well as a statin. Neurology recommended observing 1 more night and since he was stable with no Issues recommending D/C home on DAPT for 3 months with ASA 325 mg po Daily and Plavix 75 mg po Daily and then Plavix Monotherapy. He will need to follow up with PCP."    Recent cerebral angiogram on 5/18 showed multiple intracranial stenoses, with findings as follows. 1.Occluded Lt . MCA  Sup division in mid to distal M2 seg. 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 3. Mod severe focal  stenosis of Lt MCA inf division parietal branch. 4. Focal stenosis of Lt ACA prox pericallosal branches.   MRI brain (5/15):  - Redemonstrated patchy cortical/subcortical infarcts within the mid left frontal lobe/frontal operculum, now subacute.  - New from the prior brain MRI of 04/20/2020, there are small acute infarcts within the cortical and subcortical frontal lobes bilaterally, and within the left callosal body/genu (bilateral MCA and ACA territories). The largest acute infarct is present within the left callosal body/genu, measuring 2 cm.  - Redemonstrated chronic cortically based infarct within the left parietal lobe and chronic infarct within the left cerebellar Hemisphere.  - Stable background severe cerebral white matter chronic small vessel ischemic disease.  - Stable mild generalized parenchymal atrophy.  EKG 5/19: Sinus rhythm Nonspecific  T abnormalities, inferior leads and lateral leads  Past Medical History: Past Medical History:  Diagnosis Date  . Hypertension   . Stroke [facility])      Past Surgical History: Past Surgical History:  Procedure Laterality Date  . BUBBLE STUDY  06/01/2020    Procedure: BUBBLE STUDY;  Surgeon: Donato Heinz, MD;  Location: [place];  Service: Cardiovascular;;  . TEE WITHOUT CARDIOVERSION N/A 06/01/2020   Procedure: TRANSESOPHAGEAL ECHOCARDIOGRAM (TEE);  Surgeon: Donato Heinz, MD;  Location: [facility] ENDOSCOPY;  Service: Cardiovascular;  Laterality: N/A;     Medications:  No current facility-administered medications on file prior to encounter.   Current Outpatient Medications on File Prior to Encounter  Medication Sig Dispense Refill  . acetaminophen (TYLENOL) 325 MG tablet Take 2 tablets (650 mg total) by mouth every 4 (four) hours as needed for mild pain (or temp > 37.5 C (99.5 F)). 20 tablet 0  . amLODipine (NORVASC) 10 MG tablet TAKE 1 TABLET BY MOUTH DAILY. 90 tablet 3  . aspirin 325 MG EC tablet Take 1 tablet (325 mg total) by mouth daily. 30 tablet 2  . atorvastatin (LIPITOR) 40 MG tablet Take 1 tablet (40 mg total) by mouth daily. 30 tablet 0  . atorvastatin (LIPITOR) 40 MG tablet Take 40 mg by mouth daily.    . blood glucose meter kit and supplies Dispense based on patient and insurance preference. Use up to four times daily as directed. (FOR ICD-10 E10.9, E11.9). 1 each 0  . Blood Glucose Monitoring Suppl (TRUE METRIX METER) w/Device KIT Use to check blood sugar TID. 1 kit 0  . clopidogrel (PLAVIX) 75 MG tablet Take 1 tablet (75 mg total) by mouth daily. 30 tablet 0  . glucose blood (TRUE METRIX BLOOD GLUCOSE TEST) test strip Use to check blood sugar TID. 100 each 2  . Insulin Glargine (BASAGLAR KWIKPEN) 100 UNIT/ML Inject 10 Units into the skin daily. 10 mL 2  . levETIRAcetam (KEPPRA) 500 MG tablet Take 1 tablet (500 mg total) by mouth 2 (two) times daily. 60 tablet 0  . losartan-hydrochlorothiazide (HYZAAR) 100-25 MG tablet Take 1 tablet by mouth daily.    . metFORMIN (GLUCOPHAGE) 1000 MG tablet Take 1 tablet (1,000 mg total) by mouth 2 (two) times daily with a meal. 180 tablet 3  . TRUEplus Lancets 28G MISC Use to  check blood sugar 3 times daily. (Patient taking differently: Use to check blood sugar 3 times daily.) 100 each 2         Social History: Drug Use: History of drug use but most recent UTOX was negative.   Family History:  Reviewed in Epic   ROS: As per HPI    Anticoagulant use: None   Antiplatelet use: ASA and Plavix    Examination:   BP 137/84   Pulse 87   Temp 99.1 F (37.3 C) (Oral)   Resp 17   Ht _0  (1.854 m)   Wt 105 kg   SpO2 99%   BMI 30.54 kg/m   Mentation: Awake and alert. The patient exhibits expressive and receptive dysphasia. He is not able to answer orientation questions correctly except for the state. He also perseverates at times.    1A: Level of Consciousness - 0 1B: Ask Month and Age - 2 1C: Blink Eyes & Squeeze Hands - 1 2: Test Horizontal Extraocular Movements - 0 3: Test Visual Fields - 0 4: Test Facial Palsy (Use Grimace if Obtunded) - 1 5A: Test Left  Arm Motor Drift - 0 5B: Test Right Arm Motor Drift - 0 6A: Test Left Leg Motor Drift - 0 6B: Test Right Leg Motor Drift - 0 7: Test Limb Ataxia (FNF/Heel-Shin) - 1 8: Test Sensation -  0 9: Test Language/Aphasia - 2 10: Test Dysarthria - Severe Dysarthria: 0 11: Test Extinction/Inattention - Extinction to bilateral simultaneous stimulation 0   NIHSS Score: 7   Pre-Morbid Modified Rankin Scale: 1      Patient/Family was informed the Neurology Consult would occur via TeleHealth consult by way of interactive audio and video telecommunications and consented to receiving care in this manner.   Patient is being evaluated for possible acute neurologic impairment and high pretest probability of imminent or life-threatening deterioration. I spent total of 40 minutes providing care to this patient, including time for face to face visit via telemedicine, review of medical records, imaging studies and discussion of findings with providers, the patient and/or family.   Electronically signed: Dr. Kerney Elbe

## 2020-06-04 NOTE — ED Notes (Addendum)
Pt transported to MRI via stretcher. Pt, pt's sister and gf refusing to go to [facility].

## 2020-06-04 NOTE — ED Triage Notes (Signed)
BIB family, pt was walking approx 30 min ago and fell, hx of recent strokes. R leg no effort against gravity, L leg minor resistance against gravity. Code Stroke.

## 2020-06-04 NOTE — ED Notes (Signed)
PALS line contacted

## 2020-06-04 NOTE — ED Notes (Addendum)
Point of care Edit sheet faxed at 16:05 for patient to receive a credit for Beta hCG by error. (File No. 199) Fax # [phone] Status# OK  Chem 8 test run and reported correctly to MD, RN

## 2020-06-04 NOTE — ED Notes (Signed)
Pt sister Alvy Beal is a point of contact [phone]

## 2020-06-04 NOTE — ED Notes (Signed)
[place] IV infiltrated in CT, another 18 G [place] IV placed in opposite arm.

## 2020-06-04 NOTE — ED Provider Notes (Addendum)
[place] DEPT Provider Note   CSN: [id number] Arrival date & time: 06/04/20  1312     History No chief complaint on file.   Nathaniel Spears is a 45 y.o. male history of hypertension, recent stroke status post IR intervention, here presenting with trouble speaking.  Per family, patient was with family around 1 PM and suddenly had onset of trouble speaking and weakness.  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  Patient also was started on dual platelet therapy.  Patient unable to give me much history due to aphasia.   The history is provided by the patient and a relative.   Level V caveat- aphasia     Past Medical History:  Diagnosis Date  . Hypertension   . Stroke [facility])     Patient Active Problem List   Diagnosis Date Noted  . CVA (cerebral vascular accident) ([place]) 04/21/2020  . Aphasia   . Polysubstance abuse ([place])   . Type 2 diabetes mellitus without complication ([place])   . Hypokalemia   . Acute CVA (cerebrovascular accident) ([place]) 04/20/2020  . Essential hypertension 08/21/2019  . Obesity 08/21/2019    Past Surgical History:  Procedure Laterality Date  . BUBBLE STUDY  06/01/2020   Procedure: BUBBLE STUDY;  Surgeon: Donato Heinz, MD;  Location: [place];  Service: Cardiovascular;;  . TEE WITHOUT CARDIOVERSION N/A 06/01/2020   Procedure: TRANSESOPHAGEAL ECHOCARDIOGRAM (TEE);  Surgeon: Donato Heinz, MD;  Location: [facility] ENDOSCOPY;  Service: Cardiovascular;  Laterality: N/A;       No family history on file.  Social History   Tobacco Use  . Smoking status: Current Every Day Smoker  . Smokeless tobacco: Never Used  Substance Use Topics  . Alcohol use: Not Currently  . Drug use: Never    Home Medications Prior to Admission medications   Medication Sig Start Date End Date Taking? Authorizing Provider  acetaminophen (TYLENOL) 325 MG  tablet Take 2 tablets (650 mg total) by mouth every 4 (four) hours as needed for mild pain (or temp > 37.5 C (99.5 F)). 06/03/20   Sheikh, Omair Latif, DO  amLODipine (NORVASC) 10 MG tablet TAKE 1 TABLET BY MOUTH DAILY. 08/21/19 08/20/20  Kerin Perna, NP  aspirin 325 MG EC tablet Take 1 tablet (325 mg total) by mouth daily. 06/03/20   Raiford Noble Latif, DO  atorvastatin (LIPITOR) 40 MG tablet Take 1 tablet (40 mg total) by mouth daily. 04/24/20 05/27/20  Arrien, Jimmy Picket, MD  atorvastatin (LIPITOR) 40 MG tablet Take 40 mg by mouth daily.    [provider]  blood glucose meter kit and supplies Dispense based on patient and insurance preference. Use up to four times daily as directed. (FOR ICD-10 E10.9, E11.9). 04/23/20   Arrien, Jimmy Picket, MD  Blood Glucose Monitoring Suppl (TRUE METRIX METER) w/Device KIT Use to check blood sugar TID. 05/18/20   Charlott Rakes, MD  clopidogrel (PLAVIX) 75 MG tablet Take 1 tablet (75 mg total) by mouth daily. 05/24/20 06/23/20  Kerin Perna, NP  glucose blood (TRUE METRIX BLOOD GLUCOSE TEST) test strip Use to check blood sugar TID. 05/18/20   Charlott Rakes, MD  Insulin Glargine (BASAGLAR KWIKPEN) 100 UNIT/ML Inject 10 Units into the skin daily. 05/18/20   Kerin Perna, NP  levETIRAcetam (KEPPRA) 500 MG tablet Take 1 tablet (500 mg total) by mouth 2 (two) times daily. 06/03/20 07/04/20  Alfredia Client, PA-C  losartan-hydrochlorothiazide (  HYZAAR) 100-25 MG tablet Take 1 tablet by mouth daily.    [provider]  metFORMIN (GLUCOPHAGE) 1000 MG tablet Take 1 tablet (1,000 mg total) by mouth 2 (two) times daily with a meal. 05/18/20   Kerin Perna, NP  TRUEplus Lancets 28G MISC Use to check blood sugar 3 times daily. Patient taking differently: Use to check blood sugar 3 times daily. 05/23/20   Kerin Perna, NP    Allergies    Patient has no known allergies.  Review of Systems   Review of Systems  Neurological: Positive  for speech difficulty.  All other systems reviewed and are negative.   Physical Exam Updated Vital Signs BP (!) 147/102 (BP Location: Left Arm)   Pulse (!) 106   Temp 99.1 F (37.3 C) (Oral)   Resp 18   Ht _0  (1.854 m)   Wt 105 kg   SpO2 94%   BMI 30.54 kg/m   Physical Exam Vitals and nursing note reviewed.  Constitutional:      Comments: Aphasic   HENT:     Head: Normocephalic.     Nose: Nose normal.     Mouth/Throat:     Mouth: Mucous membranes are moist.  Eyes:     Extraocular Movements: Extraocular movements intact.     Pupils: Pupils are equal, round, and reactive to light.  Cardiovascular:     Rate and Rhythm: Normal rate and regular rhythm.     Pulses: Normal pulses.     Heart sounds: Normal heart sounds.  Pulmonary:     Effort: Pulmonary effort is normal.     Breath sounds: Normal breath sounds.  Abdominal:     General: Abdomen is flat.     Palpations: Abdomen is soft.  Musculoskeletal:        General: Normal range of motion.     Cervical back: Normal range of motion.  Skin:    General: Skin is warm.  Neurological:     Comments: + Expressive aphasia.  Patient does have strength of 4 out of 5 on the right arm and leg and 5 out of 5 on the left arm and leg.  Patient has mild right pronator drift  Psychiatric:        Mood and Affect: Mood normal.        Behavior: Behavior normal.     ED Results / Procedures / Treatments   Labs (all labs ordered are listed, but only abnormal results are displayed) Labs Reviewed  CBG MONITORING, ED - Abnormal; Notable for the following components:      Result Value   Glucose-Capillary 150 (*)    All other components within normal limits  RESP PANEL BY RT-PCR (FLU A&B, COVID) ARPGX2  ETHANOL  PROTIME-INR  APTT  CBC  DIFFERENTIAL  COMPREHENSIVE METABOLIC PANEL  RAPID URINE DRUG SCREEN, HOSP PERFORMED  URINALYSIS, ROUTINE W REFLEX MICROSCOPIC  I-STAT CHEM 8, ED    EKG None  Radiology CT HEAD WO  CONTRAST  Result Date: 06/03/2020 CLINICAL DATA:  Transient ischemic attack. EXAM: CT HEAD WITHOUT CONTRAST TECHNIQUE: Contiguous axial images were obtained from the base of the skull through the vertex without intravenous contrast. COMPARISON:  May 30, 2020. FINDINGS: Brain: Mild chronic ischemic white matter disease is noted. Old left posterior parietal infarction is noted. Stable left frontal low density is noted consistent with acute to subacute infarction as noted on prior MRI. No hemorrhage is noted. No mass effect or midline shift is noted.  Ventricular size is within normal limits. Old left cerebellar infarction is noted. Vascular: No hyperdense vessel or unexpected calcification. Skull: Normal. Negative for fracture or focal lesion. Sinuses/Orbits: Bilateral ethmoid and maxillary sinusitis is noted. Other: None. IMPRESSION: Stable left frontal low density is noted consistent with acute to subacute infarction as noted on recent MRI. Old left cerebellar and posterior parietal infarctions are noted. No significant changes noted compared to prior exam. Electronically Signed   By: Marijo Conception M.D.   On: 06/03/2020 16:16    Procedures Procedures  CRITICAL CARE Performed by: Wandra Arthurs   Total critical care time: 30 minutes  Critical care time was exclusive of separately billable procedures and treating other patients.  Critical care was necessary to treat or prevent imminent or life-threatening deterioration.  Critical care was time spent personally by me on the following activities: development of treatment plan with patient and/or surrogate as well as nursing, discussions with consultants, evaluation of patient's response to treatment, examination of patient, obtaining history from patient or surrogate, ordering and performing treatments and interventions, ordering and review of laboratory studies, ordering and review of radiographic studies, pulse oximetry and re-evaluation of patient's  condition.    Medications Ordered in ED Medications - No data to display  ED Course  I have reviewed the triage vital signs and the nursing notes.  Pertinent labs & imaging results that were available during my care of the patient were reviewed by me and considered in my medical decision making (see chart for details).    MDM Rules/Calculators/A&P                         Keymarion Bearman is a 45 y.o. male here presenting with aphasia and right-sided weakness.  Given that she he recently was admitted and required IR intervention for stroke, I activated code stroke.  We will get CTA head and neck and CT perfusion.  3:38 PM Patient has multiple severe stenosis.  There is concern for possible vasculitis.  I talked to Dr. Cheral Marker from neurology.  He states that patient should get LP in the ED.  He was concerned for possible CNS vasculitis.  He has been to send off cell count and VZV and oligoclonal bands and IgG and VDRL.  Recommend admission to [facility] and stroke team will follow.  Girlfriend is at bedside and is very tearful.  I reassured her.  5 pm Spoke to hospitalist, Dr. Nevada Crane and I had multiple discussions with sister and patient's significant other.  They really want him transferred to [facility].  The sister works for [employer] and wants me to transfer the patient.  However I told them that we have the capability of taking care of the patient and patient does not have a neurologist at [facility].  She states that she will call them and find out.  8:34 PM We tried to call [facility] and they are not accepting outside transfers.  At this point, patient will be admitted to [facility] since family still does not want to transfer patient to [facility]. Neurology will evaluate the patient in the morning.  Hospitalist to admit.  Final Clinical Impression(s) / ED Diagnoses Final diagnoses:  None    Rx / DC Orders ED Discharge Orders    None       Drenda Freeze, MD 06/04/20 1609    Drenda Freeze, MD 06/04/20 2034

## 2020-06-04 NOTE — Telephone Encounter (Signed)
Transition Care Management Unsuccessful Follow-up Telephone Call  Date of discharge and from where:  [facility]  Attempts:  1st Attempt  Reason for unsuccessful TCM follow-up call:  Left voice message unable to reach pt to discuss  care management at this time. Contact info provided.  Pt has app on 06/04/2020 to see clinical pharmacist at [facility].

## 2020-06-05 DIAGNOSIS — Z794 Long term (current) use of insulin: Secondary | ICD-10-CM

## 2020-06-05 DIAGNOSIS — R29810 Facial weakness: Secondary | ICD-10-CM | POA: Diagnosis present

## 2020-06-05 DIAGNOSIS — I639 Cerebral infarction, unspecified: Secondary | ICD-10-CM | POA: Diagnosis present

## 2020-06-05 DIAGNOSIS — R29707 NIHSS score 7: Secondary | ICD-10-CM | POA: Diagnosis present

## 2020-06-05 DIAGNOSIS — Z6830 Body mass index (BMI) 30.0-30.9, adult: Secondary | ICD-10-CM | POA: Diagnosis not present

## 2020-06-05 DIAGNOSIS — Z7984 Long term (current) use of oral hypoglycemic drugs: Secondary | ICD-10-CM | POA: Diagnosis not present

## 2020-06-05 DIAGNOSIS — F1490 Cocaine use, unspecified, uncomplicated: Secondary | ICD-10-CM | POA: Diagnosis not present

## 2020-06-05 DIAGNOSIS — F17210 Nicotine dependence, cigarettes, uncomplicated: Secondary | ICD-10-CM | POA: Diagnosis present

## 2020-06-05 DIAGNOSIS — R4702 Dysphasia: Secondary | ICD-10-CM | POA: Diagnosis present

## 2020-06-05 DIAGNOSIS — E669 Obesity, unspecified: Secondary | ICD-10-CM | POA: Diagnosis present

## 2020-06-05 DIAGNOSIS — G8191 Hemiplegia, unspecified affecting right dominant side: Secondary | ICD-10-CM | POA: Diagnosis present

## 2020-06-05 DIAGNOSIS — E1165 Type 2 diabetes mellitus with hyperglycemia: Secondary | ICD-10-CM

## 2020-06-05 DIAGNOSIS — Z7982 Long term (current) use of aspirin: Secondary | ICD-10-CM | POA: Diagnosis not present

## 2020-06-05 DIAGNOSIS — Z7902 Long term (current) use of antithrombotics/antiplatelets: Secondary | ICD-10-CM | POA: Diagnosis not present

## 2020-06-05 DIAGNOSIS — Z8673 Personal history of transient ischemic attack (TIA), and cerebral infarction without residual deficits: Secondary | ICD-10-CM | POA: Diagnosis not present

## 2020-06-05 DIAGNOSIS — R4701 Aphasia: Secondary | ICD-10-CM | POA: Diagnosis present

## 2020-06-05 DIAGNOSIS — F17209 Nicotine dependence, unspecified, with unspecified nicotine-induced disorders: Secondary | ICD-10-CM | POA: Diagnosis not present

## 2020-06-05 DIAGNOSIS — Z716 Tobacco abuse counseling: Secondary | ICD-10-CM | POA: Diagnosis not present

## 2020-06-05 DIAGNOSIS — Z7151 Drug abuse counseling and surveillance of drug abuser: Secondary | ICD-10-CM | POA: Diagnosis not present

## 2020-06-05 DIAGNOSIS — Z79899 Other long term (current) drug therapy: Secondary | ICD-10-CM | POA: Diagnosis not present

## 2020-06-05 DIAGNOSIS — Z20822 Contact with and (suspected) exposure to covid-19: Secondary | ICD-10-CM | POA: Diagnosis present

## 2020-06-05 DIAGNOSIS — I10 Essential (primary) hypertension: Secondary | ICD-10-CM | POA: Diagnosis present

## 2020-06-05 DIAGNOSIS — W1830XA Fall on same level, unspecified, initial encounter: Secondary | ICD-10-CM | POA: Diagnosis present

## 2020-06-05 LAB — CSF CELL COUNT WITH DIFFERENTIAL
RBC Count, CSF: 6 /mm3 — ABNORMAL HIGH
Tube #: 4
WBC, CSF: 2 /mm3 (ref 0–5)

## 2020-06-05 LAB — CBC WITH DIFFERENTIAL/PLATELET
Abs Immature Granulocytes: 0.06 10*3/uL (ref 0.00–0.07)
Basophils Absolute: 0 10*3/uL (ref 0.0–0.1)
Basophils Relative: 0 %
Eosinophils Absolute: 0 10*3/uL (ref 0.0–0.5)
Eosinophils Relative: 0 %
HCT: 37.8 % — ABNORMAL LOW (ref 39.0–52.0)
Hemoglobin: 12.1 g/dL — ABNORMAL LOW (ref 13.0–17.0)
Immature Granulocytes: 1 %
Lymphocytes Relative: 13 %
Lymphs Abs: 0.9 10*3/uL (ref 0.7–4.0)
MCH: 25.5 pg — ABNORMAL LOW (ref 26.0–34.0)
MCHC: 32 g/dL (ref 30.0–36.0)
MCV: 79.7 fL — ABNORMAL LOW (ref 80.0–100.0)
Monocytes Absolute: 0.1 10*3/uL (ref 0.1–1.0)
Monocytes Relative: 1 %
Neutro Abs: 6.3 10*3/uL (ref 1.7–7.7)
Neutrophils Relative %: 85 %
Platelets: 286 10*3/uL (ref 150–400)
RBC: 4.74 MIL/uL (ref 4.22–5.81)
RDW: 12.3 % (ref 11.5–15.5)
WBC: 7.3 10*3/uL (ref 4.0–10.5)
nRBC: 0 % (ref 0.0–0.2)

## 2020-06-05 LAB — GLUCOSE, CAPILLARY
Glucose-Capillary: 227 mg/dL — ABNORMAL HIGH (ref 70–99)
Glucose-Capillary: 207 mg/dL — ABNORMAL HIGH (ref 70–99)
Glucose-Capillary: 211 mg/dL — ABNORMAL HIGH (ref 70–99)
Glucose-Capillary: 295 mg/dL — ABNORMAL HIGH (ref 70–99)

## 2020-06-05 LAB — COMPREHENSIVE METABOLIC PANEL
ALT: 15 U/L (ref 0–44)
AST: 12 U/L — ABNORMAL LOW (ref 15–41)
Albumin: 3.6 g/dL (ref 3.5–5.0)
Alkaline Phosphatase: 71 U/L (ref 38–126)
Anion gap: 10 (ref 5–15)
BUN: 19 mg/dL (ref 6–20)
CO2: 23 mmol/L (ref 22–32)
Calcium: 9.2 mg/dL (ref 8.9–10.3)
Chloride: 103 mmol/L (ref 98–111)
Creatinine, Ser: 1.23 mg/dL (ref 0.61–1.24)
GFR, Estimated: >60 mL/min (ref >60)
Glucose, Bld: 226 mg/dL — ABNORMAL HIGH (ref 70–99)
Potassium: 3.9 mmol/L (ref 3.5–5.1)
Sodium: 136 mmol/L (ref 135–145)
Total Bilirubin: 0.6 mg/dL (ref 0.3–1.2)
Total Protein: 8.1 g/dL (ref 6.5–8.1)

## 2020-06-05 LAB — URINALYSIS, ROUTINE W REFLEX MICROSCOPIC
Bilirubin Urine: NEGATIVE
Glucose, UA: 150 mg/dL — AB
Hgb urine dipstick: NEGATIVE
Ketones, ur: NEGATIVE mg/dL
Leukocytes,Ua: NEGATIVE
Nitrite: NEGATIVE
Protein, ur: NEGATIVE mg/dL
Specific Gravity, Urine: 1.028 (ref 1.005–1.030)
pH: 5 (ref 5.0–8.0)

## 2020-06-05 LAB — RAPID URINE DRUG SCREEN, HOSP PERFORMED
Amphetamines: NOT DETECTED
Barbiturates: NOT DETECTED
Benzodiazepines: NOT DETECTED
Cocaine: NOT DETECTED
Opiates: NOT DETECTED
Tetrahydrocannabinol: NOT DETECTED

## 2020-06-05 LAB — SEDIMENTATION RATE: Sed Rate: 57 mm/hr — ABNORMAL HIGH (ref 0–16)

## 2020-06-05 LAB — C-REACTIVE PROTEIN: CRP: 5.3 mg/dL — ABNORMAL HIGH (ref <1.0)

## 2020-06-05 LAB — ANTITHROMBIN III: AntiThromb III Func: 103 % (ref 75–120)

## 2020-06-05 LAB — MAGNESIUM: Magnesium: 1.9 mg/dL (ref 1.7–2.4)

## 2020-06-05 MED ORDER — ADULT MULTIVITAMIN W/MINERALS CH
1.0000 | ORAL_TABLET | Freq: Every day | ORAL | Status: DC
  Administered 2020-06-05 – 2020-06-06 (×2): 1 via ORAL
  Filled 2020-06-05 (×2): qty 1

## 2020-06-05 MED ORDER — ENSURE MAX PROTEIN PO LIQD
11.0000 [oz_av] | Freq: Every day | ORAL | Status: DC
  Administered 2020-06-05 – 2020-06-06 (×2): 11 [oz_av] via ORAL
  Filled 2020-06-05 (×2): qty 330

## 2020-06-05 MED ORDER — GLUCERNA SHAKE PO LIQD
237.0000 mL | Freq: Two times a day (BID) | ORAL | Status: DC
  Administered 2020-06-06 (×2): 237 mL via ORAL
  Filled 2020-06-05 (×3): qty 237

## 2020-06-05 NOTE — Progress Notes (Addendum)
Received a call from pt.sister Kisha who informed RN that she was able to secure a bed in [facility]. Paged MD thru secure chat and amion, awaiting for further instruction. Meanwhile bed is available in [facility]. Charge Nurse aware of the status. Family still refused transfer to [facility], as per conversation with MD beforehand transfer is done between provider to provider.

## 2020-06-05 NOTE — Progress Notes (Addendum)
Patient ID: Colbi Staubs, male   DOB: [birth year], 45 y.o.   MRN: [id number] Spoke to significant other/[name] on phone who was actually adamant that patient's family does not want patient to be transferred to [facility].  She requested that I speak to patient's sister Alvy Beal.  I spoke to [place] on phone who said the same thing that she does not want patient to be transferred to [facility] because 'the family does not trust the neurologists at [facility] and the neurologists did nothing for the patient during his recent hospitalization at [facility] for stroke and he is still having new strokes'.  Alvy Beal said that she still wants Giovonnie to be transferred to [facility].  I told her that I will call [facility] transfer center to try to transfer him there.  I still requested to her that [facility] be transferred to [facility] in the interim for neurology evaluation till he gets transferred to [facility] since the patient is having new strokes.  She was reluctant about it but stated that she would speak to her mother and get back to [place].  She made it clear that we should not transfer Kiondre to [facility] without hearing from her. Subsequently, I called the transfer center at [facility] and I was told that they are not accepting transfers today and are at capacity and holding patients in the ED.  I was asked to call back again tomorrow.  I have relayed this information to patient's nurse Victorino Dike who will relay this information to the patient's Sister Alvy Beal. I have also spoken to Dr. Arora/neurology on phone and updated him regarding above.

## 2020-06-05 NOTE — Evaluation (Signed)
Physical Therapy Evaluation Patient Details Name: Nathaniel Spears MRN: [id number] DOB: [birth year] Today's Date: 06/05/2020   History of Present Illness  45 yo male who presents to ED on 5/20 with worsening R weakness, fall. CTA head shows focal occlusion vs severe stenosis L A2 anterior cerebral artery, progressive severe focal stenosis within proximal L MCA.  Pt with recent admission for acute infarcts involving the cortical and subcortical frontal lobes bilaterally with CTA angio on 5/18 showing multifocal athero at L MCA/bilat ACAs d/c 5/19, 4/5-4/8 with acute L middle frontal lobe CVA. PMH: CVA, HTN, substance use (cocaine positive recent admission), DMII, obesity.  Clinical Impression   Pt presents with LE weakness R>L, impaired dynamic balance, cognitive difficulties including poor attention and directional ability, aphasic speech (more expressive on eval) worsened vs last hospitalization, and poor activity tolerance vs baseline Pt to benefit from acute PT to address deficits. Pt requiring close guard to min assist for mobility tasks at this time, gait and balance show deterioration with cognitive challenges like path finding and walking/talking. Pt is frustrated and tearful given speech, strength, balance, and tolerance deficits today, this PT saw pt earlier this week on 5/19 and he is worse functionally at present vs now. PT recommending CIR, has family support via girlfriend and siblings at d/c. PT to progress mobility as tolerated, and will continue to follow acutely.      Follow Up Recommendations CIR    Equipment Recommendations  None recommended by PT    Recommendations for Other Services       Precautions / Restrictions Precautions Precautions: Fall Precaution Comments: expressive aphasia, right inattention, impaired motor planning Restrictions Weight Bearing Restrictions: No      Mobility  Bed Mobility Overal bed mobility: Needs Assistance             General bed  mobility comments: up in chair    Transfers Overall transfer level: Needs assistance Equipment used: None Transfers: Sit to/from Stand Sit to Stand: Min assist         General transfer comment: min assist to steady upon standing, pt with widened BOS  Ambulation/Gait Ambulation/Gait assistance: Min guard;Min assist Gait Distance (Feet): 100 Feet (+80) Assistive device: None Gait Pattern/deviations: Step-through pattern;Decreased stride length;Trunk flexed;Wide base of support Gait velocity: decr   General Gait Details: min guard for safety during normal gait, requires min assist to steady during challenges (squat down to pick up object, stairs, walking/talking). Pt got lost in straight hallway and could not find his way back to his room.  Stairs Stairs: Yes Stairs assistance: Min guard Stair Management: Two rails;Forwards;Alternating pattern Number of Stairs: 10 General stair comments: min guard for safety, slowed pace and difficulty with power up steps. Limited tolerance  Wheelchair Mobility    Modified Rankin (Stroke Patients Only) Modified Rankin (Stroke Patients Only) Pre-Morbid Rankin Score: Moderate disability Modified Rankin: Moderately severe disability     Balance Overall balance assessment: Needs assistance;History of Falls Sitting-balance support: Feet supported Sitting balance-Leahy Scale: Fair     Standing balance support: No upper extremity supported;During functional activity Standing balance-Leahy Scale: Fair Standing balance comment: difficulty (requiring close guard to min assist) with dynamic challenges like squat to pick up object, stairs, directional changes, walking/talking                             Pertinent Vitals/Pain Pain Assessment: Faces Pain Intervention(s): Monitored during session    Home Living Family/patient expects to  be discharged to:: Private residence Living Arrangements: Spouse/significant other Available Help  at Discharge: Family;Available 24 hours/day Type of Home: Apartment Home Access: Stairs to enter Entrance Stairs-Rails: Right;Left Entrance Stairs-Number of Steps: flight Home Layout: One level Home Equipment: None Additional Comments: Wife typically works from home but has been on [employer] for past month to assist pt. Information taken from prior admission as patient has expressive aphasia.    Prior Function Level of Independence: Independent         Comments: Prior to CVA in April 2022, pt was Independent in all tasks. Now, pt requires supervision for ADLs/IADLs to ensure safety due to cognitive deficits from CVA. Pt with one recent fall in [employer] parking lot due to knee buckling per pt. Pt has been receiving OP OT/SLP services.     Hand Dominance   Dominant Hand: Left    Extremity/Trunk Assessment   Upper Extremity Assessment Upper Extremity Assessment: Defer to OT evaluation RUE Deficits / Details: WFL ROM, overall 4+/5 strength RUE Sensation:  (appears functional but states yes when asking about numbness/tingling) RUE Coordination:  (appears functional) LUE Deficits / Details: WFL ROM, 4+/5 strength LUE Sensation: WNL (appears functional) LUE Coordination:  (appears functional, left handed)    Lower Extremity Assessment Lower Extremity Assessment: RLE deficits/detail;Generalized weakness RLE Deficits / Details: 3+/5 knee extension, 3/5 knee flexion, 3/5 hip abd, 3+/5 hip add, full AROM DF/PF    Cervical / Trunk Assessment Cervical / Trunk Assessment: Normal  Communication   Communication: Expressive difficulties;Receptive difficulties  Cognition Arousal/Alertness: Awake/alert Behavior During Therapy: WFL for tasks assessed/performed Overall Cognitive Status: Impaired/Different from baseline Area of Impairment: Problem solving;Safety/judgement;Following commands;Attention                   Current Attention Level: Focused   Following Commands: Follows one  step commands with increased time;Follows one step commands inconsistently Safety/Judgement: Decreased awareness of safety   Problem Solving: Slow processing;Requires verbal cues;Difficulty sequencing General Comments: Pt unable to follow some cues for functional tasks, for example PT asked pt to blow his nose, pt states "okay" then he just keeps wiping his nose even with demonstrative cues. Pt demonstrating very poor directional ability, cannot find his way back to room after walking in straight hallway making no turns. Pt very frustrated by speech difficulty, crying throguhout session. Requires cues to stay on task, needs quiet environment.      General Comments      Exercises     Assessment/Plan    PT Assessment Patient needs continued PT services  PT Problem List Decreased strength;Decreased mobility;Decreased safety awareness;Decreased activity tolerance;Decreased balance;Decreased cognition;Decreased coordination;Decreased knowledge of precautions       PT Treatment Interventions DME instruction;Balance training;Gait training;Stair training;Cognitive remediation;Neuromuscular re-education;Functional mobility training;Patient/family education;Therapeutic activities;Therapeutic exercise    PT Goals (Current goals can be found in the Care Plan section)  Acute Rehab PT Goals Patient Stated Goal: feel like myself PT Goal Formulation: With patient Time For Goal Achievement: 06/14/20 Potential to Achieve Goals: Good    Frequency Min 4X/week   Barriers to discharge        Co-evaluation               AM-PAC PT "6 Clicks" Mobility  Outcome Measure Help needed turning from your back to your side while in a flat bed without using bedrails?: A Little Help needed moving from lying on your back to sitting on the side of a flat bed without using bedrails?: A Little Help needed  moving to and from a bed to a chair (including a wheelchair)?: A Little Help needed standing up from a  chair using your arms (e.g., wheelchair or bedside chair)?: A Little Help needed to walk in hospital room?: A Little Help needed climbing 3-5 steps with a railing? : A Lot 6 Click Score: 17    End of Session Equipment Utilized During Treatment: Gait belt Activity Tolerance: Patient limited by fatigue;Patient tolerated treatment well Patient left: with call bell/phone within reach;in chair;with chair alarm set Nurse Communication: Mobility status PT Visit Diagnosis: Muscle weakness (generalized) (M62.81);Unsteadiness on feet (R26.81)    Time: 6962-9528 PT Time Calculation (min) (ACUTE ONLY): 24 min   Charges:   PT Evaluation $PT Eval Low Complexity: 1 Low PT Treatments $Gait Training: 8-22 mins       Marye Round, PT DPT Acute Rehabilitation Services Pager [phone]  Office [phone]   Truddie Coco 06/05/2020, 12:35 PM

## 2020-06-05 NOTE — Evaluation (Addendum)
Occupational Therapy Evaluation Patient Details Name: Nathaniel Spears MRN: [id number] DOB: July 06, 1975 Today's Date: 06/05/2020    History of Present Illness 45 yo male who presents to ED on 5/20 with worsening R weakness, fall. CTA head shows focal occlusion vs severe stenosis L A2 anterior cerebral artery, progressive severe focal stenosis within proximal L MCA.  Pt with recent admission for acute infarcts involving the cortical and subcortical frontal lobes bilaterally with CTA angio on 5/18 showing multifocal athero at L MCA/bilat ACAs d/c 5/19, 4/5-4/8 with acute L middle frontal lobe CVA. PMH: CVA, HTN, substance use (cocaine positive recent admission), DMII, obesity.   Clinical Impression   Nathaniel Spears is a 45 year old man s/p CVAs who presents now back at hospital after fall at home and reports of new LE weakness. ON evaluation patient exhibits functional ROM, strength and coordination of upper extremities, decreased strength in bilateral lower extremities with MMT, reports of numbness in RLE and maybe RUE (assessment difficult due to aphasia), cognitive deficits including apraxia and right inattention and global aphasia. Due to aphasia assessment limited. Patient able to perform ADLs with supervision and verbal cues for sequencing. Patient exhibited some mild perseveration during grooming task. Patient needs increased time to motor plan before initiating task. With standing at the sink patient looking concerned in the mirror when therapist probed patient states "I am disgusted with myself." Concerns for some depression due to recent medical issues and impairments. Patient will benefit from skilled OT services while in hospital to improve deficits and learn compensatory strategies as needed in order to return to PLOF. Patient would benefit from aggressive short term rehab to assist with improving cognitive deficits and functional abilities to improve deficits considering young age and  independence prior to stroke.    Follow Up Recommendations  CIR    Equipment Recommendations  None recommended by OT    Recommendations for Other Services Rehab consult     Precautions / Restrictions Precautions Precautions: Fall Precaution Comments: expressive aphasia, right inattention, impaired motor planning Restrictions Weight Bearing Restrictions: No      Mobility Bed Mobility Overal bed mobility: Needs Assistance             General bed mobility comments: up in chair    Transfers Overall transfer level: Needs assistance Equipment used: None Transfers: Sit to/from Stand Sit to Stand: Min assist         General transfer comment: min assist to steady upon standing, pt with widened BOS    Balance Overall balance assessment: Needs assistance;History of Falls Sitting-balance support: Feet supported Sitting balance-Leahy Scale: Fair     Standing balance support: No upper extremity supported;During functional activity Standing balance-Leahy Scale: Fair Standing balance comment: difficulty (requiring close guard to min assist) with dynamic challenges like squat to pick up object, stairs, directional changes, walking/talking                           ADL either performed or assessed with clinical judgement   ADL Overall ADL's : Needs assistance/impaired Eating/Feeding: Set up;Sitting;Supervision/ safety;Cueing for sequencing Eating/Feeding Details (indicate cue type and reason): verbal cues to initiate task - take lid off of food and hand patient spoon. Patient just looking at food unsure what to do Grooming: Standing;Cueing for sequencing Grooming Details (indicate cue type and reason): stood at sink, needs verbal cues at times. Able to perform oral care with increased time to initiate tasks and figure out what  to do. Needed verbal cue where to place tooth brush when finished and to rise. Perseverated on wringing out wash cloth Upper Body Bathing:  Supervision/ safety;Standing;Cueing for sequencing   Lower Body Bathing: Min guard;Supervison/ safety;Cueing for sequencing   Upper Body Dressing : Supervision/safety;Standing   Lower Body Dressing: Cueing for sequencing;Supervision/safety;Min guard   Toilet Transfer: +2 for safety/equipment;Regular Toilet Toilet Transfer Details (indicate cue type and reason): min guard for safety, no overt LOB without AD Toileting- Clothing Manipulation and Hygiene: Supervision/safety       Functional mobility during ADLs: Min guard General ADL Comments: Pt with most deficits noted in cognition and speech, benefits from cues for sequencing ADL tasks.     Vision Patient Visual Report: No change from baseline Additional Comments: Right inattention. Able to locate clock and tell the time. Perseverates on time when asked to count fingers on the right. Reports no when asked about blurry vision or double vision.     Perception     Praxis      Pertinent Vitals/Pain Pain Assessment: Faces Pain Intervention(s): Monitored during session     Hand Dominance Left   Extremity/Trunk Assessment Upper Extremity Assessment Upper Extremity Assessment: Defer to OT evaluation RUE Deficits / Details: WFL ROM, overall 4+/5 strength RUE Sensation:  (appears functional but states yes when asking about numbness/tingling) RUE Coordination:  (appears functional) LUE Deficits / Details: WFL ROM, 4+/5 strength LUE Sensation: WNL (appears functional) LUE Coordination:  (appears functional, left handed)   Lower Extremity Assessment Lower Extremity Assessment: RLE deficits/detail;Generalized weakness RLE Deficits / Details: 3+/5 knee extension, 3/5 knee flexion, 3/5 hip abd, 3+/5 hip add, full AROM DF/PF   Cervical / Trunk Assessment Cervical / Trunk Assessment: Normal   Communication Communication Communication: Expressive difficulties;Receptive difficulties   Cognition Arousal/Alertness: Awake/alert Behavior  During Therapy: WFL for tasks assessed/performed Overall Cognitive Status: Impaired/Different from baseline Area of Impairment: Problem solving;Safety/judgement;Following commands;Attention                   Current Attention Level: Focused   Following Commands: Follows one step commands with increased time;Follows one step commands inconsistently Safety/Judgement: Decreased awareness of safety   Problem Solving: Slow processing;Requires verbal cues;Difficulty sequencing General Comments: Pt unable to follow some cues for functional tasks, for example PT asked pt to blow his nose, pt states "okay" then he just keeps wiping his nose even with demonstrative cues. Pt demonstrating very poor directional ability, cannot find his way back to room after walking in straight hallway making no turns. Pt very frustrated by speech difficulty, crying throguhout session. Requires cues to stay on task, needs quiet environment.   General Comments       Exercises     Shoulder Instructions      Home Living Family/patient expects to be discharged to:: Private residence Living Arrangements: Spouse/significant other Available Help at Discharge: Family;Available 24 hours/day Type of Home: Apartment Home Access: Stairs to enter [employer] of Steps: flight Entrance Stairs-Rails: Right;Left Home Layout: One level Alternate Level Stairs-Number of Steps: Flight   Bathroom Shower/Tub: Chief Strategy Officer: Standard     Home Equipment: None   Additional Comments: Wife typically works from home but has been on [employer] for past month to assist pt. Information taken from prior admission as patient has expressive aphasia.  Lives With: Significant other    Prior Functioning/Environment Level of Independence: Independent        Comments: Prior to CVA in April 2022, pt was  Independent in all tasks. Now, pt requires supervision for ADLs/IADLs to ensure safety due to cognitive  deficits from CVA. Pt with one recent fall in [employer] parking lot due to knee buckling per pt. Pt has been receiving OP OT/SLP services.        OT Problem List: Decreased strength;Decreased activity tolerance;Decreased coordination;Decreased cognition;Impaired vision/perception;Impaired balance (sitting and/or standing);Decreased safety awareness      OT Treatment/Interventions: Self-care/ADL training;Therapeutic exercise;DME and/or AE instruction;Therapeutic activities;Patient/family education;Balance training    OT Goals(Current goals can be found in the care plan section) Acute Rehab OT Goals OT Goal Formulation: Patient unable to participate in goal setting Time For Goal Achievement: 06/19/20 Potential to Achieve Goals: Good ADL Goals Pt Will Perform Eating: with supervision Pt Will Perform Grooming: with supervision Pt Will Transfer to Toilet: with supervision;regular height toilet;grab bars;ambulating Pt Will Perform Toileting - Clothing Manipulation and hygiene: with supervision;sit to/from stand Additional ADL Goal #1: Patient will perform 1 functional task without verbal cue from therapist  OT Frequency: Min 3X/week   Barriers to D/C:            Co-evaluation              AM-PAC OT "6 Clicks" Daily Activity     Outcome Measure Help from another person eating meals?: A Little Help from another person taking care of personal grooming?: A Little Help from another person toileting, which includes using toliet, bedpan, or urinal?: A Little Help from another person bathing (including washing, rinsing, drying)?: A Little Help from another person to put on and taking off regular upper body clothing?: A Little Help from another person to put on and taking off regular lower body clothing?: A Little 6 Click Score: 18   End of Session Equipment Utilized During Treatment: Gait belt Nurse Communication: Mobility status (informed RN of details in regards to deficits and wrote  on white board)  Activity Tolerance: Patient tolerated treatment well Patient left: in chair;with call bell/phone within reach;with chair alarm set  OT Visit Diagnosis: Muscle weakness (generalized) (M62.81);Other symptoms and signs involving cognitive function;Cognitive communication deficit (R41.841) Symptoms and signs involving cognitive functions: Cerebral infarction                Time: 6440-3474 OT Time Calculation (min): 34 min Charges:  OT General Charges $OT Visit: 1 Visit OT Evaluation $OT Eval Moderate Complexity: 1 Mod OT Treatments $Self Care/Home Management : 8-22 mins  Nathaniel Spears, OTR/L Acute Care Rehab Services  Office [phone] Pager: [phone]   Kelli Churn 06/05/2020, 12:34 PM

## 2020-06-05 NOTE — Plan of Care (Signed)
Patient seen by telemedicine neurology yesterday with plans for vasculitis work-up, arteriogram and stroke team evaluation at [facility]. Patient and family requesting transfer to [facility] but beds not available. I spoke with Dr. Erby Pian and informed him that the arteriogram and post stroke rounding and work-up is only available at [facility] and unfortunately would not be able to be done at [facility]. He is in communication with the family to make the transfer happen but family insisting on [facility] transfer, which is unavailable at the moment. Stroke team will follow the patient once he is transferred to [facility]. The case had yesterday been discussed with Dr. Otelia Limes and Dr. Corliss Skains, along with input from me for further investigation and management of his recurrent strokes.  -- Milon Dikes, MD Neurologist [facility] Pager: [phone]

## 2020-06-05 NOTE — Progress Notes (Signed)
HOSPITAL MEDICINE OVERNIGHT EVENT NOTE    Notified by nursing at the start of my shift that bedside nursing was contacted by the sister Adonai Selsor and informed that there was bed availability for patient to be transferred to [facility].  I called the sister (who is an employee at [employer]) who confirmed that she was informed that beds were available per her discussion with administration at [facility].  She informed me that I could go ahead and proceed with calling the transfer center at [facility] to start on the transfer process per the usual protocol.  I attempted to call the [facility] transfer center at [phone] and spoke to all staff in the transfer center.  All of them stated that there is no bed availability at this time, that their facility does not do formal wait lists and to call back at a later time such as tomorrow morning to reassess bed availability.  They are not aware of there being any bed availability for outside transfers this afternoon or evening.  I have called the patient's sister and informed her of this.  Marinda Elk  MD [facility]

## 2020-06-05 NOTE — Progress Notes (Signed)
Patient ID: Nathaniel Spears, male   DOB: [birth year], 45 y.o.   MRN: [id number]  PROGRESS NOTE    [name]  EVO:[id number] DOB: 1975-04-16 DOA: 06/04/2020 PCP: Grayce Sessions, NP   Brief Narrative:  45 year old male with history of diabetes mellitus type 2, hyperlipidemia, hypertension, cocaine abuse, obesity, nicotine dependence and multiple recent strokes requiring hospitalization presented on 06/04/2020 with right lower extremity weakness and fall, 24 hours after discharge from [facility] for recently diagnosed stroke.  Of note, patient was hospitalized at [facility] from 4/5 until 04/23/2020 after presenting with aphasia.  Patient was identified to be suffering from an acute stroke of the left middle frontal lobe.  Patient was initiated on dual antiplatelet therapy and eventually discharged on 4/8.  Patient then presented again to [facility] on 5/15 with symptoms of left lower extremity weakness, dizziness falling and recurrent aphasia.  On this presentation a repeat MRI was obtained demonstrating ischemia in the ACA distribution as well as his previous MCA stroke.  Patient was eventually discharged from [facility] on 5/19.  Shortly after discharge on 5/19 patient presented via EMS back to [facility] emergency department after the family noticed patient was having difficulty with speech and acting strangely.  On that evaluation, case was reviewed with neurology and no further imaging was recommended.  Initiation of Keppra for seizure prophylaxis was recommended however and patient was discharged home.  On 5/20, patient was with family at home in the early afternoon when he suddenly fell while walking.  This was a witnessed fall by the patient's girlfriend.  Patient once again began to exhibit difficulty with speech.  Patient was then brought into [facility] emergency department.  On presentation, patient was evaluated by teleneurology. A stat CT  angiogram of the head and neck was obtained as well as cerebral perfusion imaging and noncontrast CT imaging of the head.  Imaging revealed that many of the areas of cerebrovascular disease were unchanged however it also identified several areas of progressive disease.  Neurology recommended hospitalization for repeat MRI and additional work-up.  They also recommended a vasculitis panel and lumbar puncture.  A lumbar puncture was performed in the emergency department as recommended.  Patient was to be admitted to [facility] however due to the patient and family's dissatisfaction with [facility] they instead requested that the patient be transferred to [facility] for further care.  After the ER provider attempted this and it was unsuccessful, family then agreed for patient to be admitted to [facility] for further management instead of going to [facility].  This was approved by Dr. Otelia Limes with neurology.    Assessment & Plan:   Acute recurrent ischemic strokes -Presented with intermittent expressive and receptive aphasia with associated left-sided weakness and fall. -Apparently he is compliant with his home aspirin, Plavix and statin therapy and now abstinent of cocaine use and has quit smoking. -Noncontrast CT head with CTA and perfusion imaging here in the emergency department concerning for progressive cerebrovascular process with possible acute or subacute strokes -overall processes concerning for possible underlying vasculitis/autoimmune disease or hypercoagulable state. -MRI of brain shows multiple areas of subacute infarct as noted in the prior MRI of 05/30/2020 along with several small areas of acute infarct have developed since the prior MRI.  Patient initially refused admission to [facility] as recommended by teleneurology and wanted to be transferred to [facility] which was attempted by ER provider without success.  Eventually patient  was admitted to [facility]. -He is currently  agreeable to be transferred to [facility].  Transferred to [facility] for further evaluation by neurology.  Neurology team aware. -Status post LP in the ED as per neurology recommendations and currently on high-dose Solu-Medrol as well -Continue aspirin, statin, Plavix and Keppra.  PT/OT/SLP evaluation  Essential hypertension -Allow for permissive hypertension. -Blood pressure currently stable  Diabetes mellitus type 2 with hyperglycemia -Continue Lantus along with CBGs with SSI.  A1c was 10.3 on 05/31/2020  Nicotine dependence -Apparently recently quit smoking  History of cocaine abuse -Recently quit reportedly.  DVT prophylaxis: SCDs Code Status: Full Family Communication: None at bedside Disposition Plan: Status is: Inpatient  Remains inpatient appropriate because:Inpatient level of care appropriate due to severity of illness   Dispo:  Patient From: Home  Planned Disposition: Home  Medically stable for discharge: No     Consultants: Neurology  Procedures: None  Antimicrobials: None   Subjective: Patient seen and examined at bedside.  He is agreeable for transfer to [facility].  Still feels weak and states that his symptoms have not improved much.  No overnight fever or vomiting reported.  Objective: Vitals:   06/04/20 2320 06/05/20 0027 06/05/20 0602 06/05/20 1033  BP:  137/90 132/86 (!) 152/99  Pulse: 82 82 74 81  Resp: 20 20 20 19   Temp:  98.5 F (36.9 C) 97.8 F (36.6 C) 98.5 F (36.9 C)  TempSrc:  Oral Oral   SpO2: 98% 98% 95% 99%  Weight:      Height:        Intake/Output Summary (Last 24 hours) at 06/05/2020 1112 Last data filed at 06/05/2020 0900 Gross per 24 hour  Intake 534.88 ml  Output 300 ml  Net 234.88 ml   Filed Weights   06/04/20 1354 06/04/20 2226  Weight: 105 kg 110.3 kg    Examination:  General exam: Appears calm and comfortable.  Poor historian.  Currently on room air. Respiratory system: Bilateral  decreased breath sounds at bases Cardiovascular system: S1 & S2 heard, Rate controlled Gastrointestinal system: Abdomen is nondistended, soft and nontender. Normal bowel sounds heard. Extremities: No cyanosis, clubbing, edema  Central nervous system: Awake and alert.  Slow to respond.  Follows commands.  No focal neurological deficits. Moving extremities Skin: No rashes, lesions or ulcers Psychiatry: Flat affect   Data Reviewed: I have personally reviewed following labs and imaging studies  CBC: Recent Labs  Lab 06/01/20 0059 06/02/20 0038 06/03/20 0129 06/03/20 1540 06/04/20 1352 06/04/20 1502 06/05/20 0657  WBC 6.8 7.4 6.8 7.5 7.4  --  7.3  NEUTROABS 3.5 4.3 4.0  --  4.7  --  6.3  HGB 11.8* 12.2* 11.9* 12.3* 12.3* 18.4* 12.1*  HCT 36.8* 37.6* 36.5* 38.6* 38.5* 54.0* 37.8*  MCV 79.8* 79.0* 78.5* 81.1 80.2  --  79.7*  PLT 200 222 232 239 282  --  286   Basic Metabolic Panel: Recent Labs  Lab 05/31/20 0330 06/01/20 0059 06/02/20 0038 06/03/20 0129 06/03/20 1540 06/04/20 1352 06/04/20 1502 06/05/20 0657  NA 140 140 139 138 140 141 143 136  K 3.0* 3.3* 3.5 3.8 3.9 3.7 3.7 3.9  CL 107 108 105 106 108 104 102 103  CO2 24 25 27 23 24 28   --  23  GLUCOSE 120* 117* 99 98 104* 152* 141* 226*  BUN 14 10 7 9 16 20 18 19   CREATININE 1.17 1.12 1.24 1.22 1.47* 1.36* 1.40*  1.23  CALCIUM 8.2* 8.5* 8.7* 8.7* 8.8* 9.2  --  9.2  MG 1.5* 1.8 1.6* 1.9  --   --   --  1.9   GFR: Estimated Creatinine Clearance: 98.8 mL/min (by C-G formula based on SCr of 1.23 mg/dL). Liver Function Tests: Recent Labs  Lab 05/30/20 1326 06/03/20 1540 06/04/20 1352 06/05/20 0657  AST 14* 13* 15 12*  ALT 14 14 14 15   ALKPHOS 69 62 67 71  BILITOT 0.4 0.7 0.6 0.6  PROT 7.3 7.2 8.4* 8.1  ALBUMIN 3.7 3.2* 3.9 3.6   No results for input(s): LIPASE, AMYLASE in the last 168 hours. Recent Labs  Lab 05/30/20 1328  AMMONIA 38*   Coagulation Profile: Recent Labs  Lab 05/30/20 1326 06/04/20 1352   INR 1.1 1.0   Cardiac Enzymes: No results for input(s): CKTOTAL, CKMB, CKMBINDEX, TROPONINI in the last 168 hours. BNP (last 3 results) No results for input(s): PROBNP in the last 8760 hours. HbA1C: No results for input(s): HGBA1C in the last 72 hours. CBG: Recent Labs  Lab 06/03/20 0800 06/03/20 1119 06/04/20 1341 06/04/20 2250 06/05/20 0814  GLUCAP 102* 149* 150* 227* 227*   Lipid Profile: No results for input(s): CHOL, HDL, LDLCALC, TRIG, CHOLHDL, LDLDIRECT in the last 72 hours. Thyroid Function Tests: No results for input(s): TSH, T4TOTAL, FREET4, T3FREE, THYROIDAB in the last 72 hours. Anemia Panel: Recent Labs    06/03/20 0129  VITAMINB12 349  FOLATE 19.8  FERRITIN 302  TIBC 301  IRON 26*  RETICCTPCT 0.8   Sepsis Labs: No results for input(s): PROCALCITON, LATICACIDVEN in the last 168 hours.  Recent Results (from the past 240 hour(s))  Resp Panel by RT-PCR (Flu A&B, Covid) Nasopharyngeal Swab     Status: None   Collection Time: 05/30/20  1:55 PM   Specimen: Nasopharyngeal Swab; Nasopharyngeal(NP) swabs in vial transport medium  Result Value Ref Range Status   SARS Coronavirus 2 by RT PCR NEGATIVE NEGATIVE Final    Comment: (NOTE) SARS-CoV-2 target nucleic acids are NOT DETECTED.  The SARS-CoV-2 RNA is generally detectable in upper respiratory specimens during the acute phase of infection. The lowest concentration of SARS-CoV-2 viral copies this assay can detect is 138 copies/mL. A negative result does not preclude SARS-Cov-2 infection and should not be used as the sole basis for treatment or other patient management decisions. A negative result may occur with  improper specimen collection/handling, submission of specimen other than nasopharyngeal swab, presence of viral mutation(s) within the areas targeted by this assay, and inadequate number of viral copies(<138 copies/mL). A negative result must be combined with clinical observations, patient history,  and epidemiological information. The expected result is Negative.  Fact Sheet for Patients:  06/01/20  Fact Sheet for Healthcare Providers:  BloggerCourse.com  This test is no t yet approved or cleared by the [place] FDA and  has been authorized for detection and/or diagnosis of SARS-CoV-2 by FDA under an Emergency Use Authorization (EUA). This EUA will remain  in effect (meaning this test can be used) for the duration of the COVID-19 declaration under Section 564(b)(1) of the Act, 21 U.S.C.section 360bbb-3(b)(1), unless the authorization is terminated  or revoked sooner.       Influenza A by PCR NEGATIVE NEGATIVE Final   Influenza B by PCR NEGATIVE NEGATIVE Final    Comment: (NOTE) The Xpert Xpress SARS-CoV-2/FLU/RSV plus assay is intended as an aid in the diagnosis of influenza from Nasopharyngeal swab specimens and should not be used as  a sole basis for treatment. Nasal washings and aspirates are unacceptable for Xpert Xpress SARS-CoV-2/FLU/RSV testing.  Fact Sheet for Patients: BloggerCourse.com  Fact Sheet for Healthcare Providers: SeriousBroker.it  This test is not yet approved or cleared by the [place] FDA and has been authorized for detection and/or diagnosis of SARS-CoV-2 by FDA under an Emergency Use Authorization (EUA). This EUA will remain in effect (meaning this test can be used) for the duration of the COVID-19 declaration under Section 564(b)(1) of the Act, 21 U.S.C. section 360bbb-3(b)(1), unless the authorization is terminated or revoked.  Performed at [facility], [address], [place], [place] [address]   Resp Panel by RT-PCR (Flu A&B, Covid) Nasopharyngeal Swab     Status: None   Collection Time: 06/04/20  1:43 PM   Specimen: Nasopharyngeal Swab; Nasopharyngeal(NP) swabs in vial transport medium  Result Value  Ref Range Status   SARS Coronavirus 2 by RT PCR NEGATIVE NEGATIVE Final    Comment: (NOTE) SARS-CoV-2 target nucleic acids are NOT DETECTED.  The SARS-CoV-2 RNA is generally detectable in upper respiratory specimens during the acute phase of infection. The lowest concentration of SARS-CoV-2 viral copies this assay can detect is 138 copies/mL. A negative result does not preclude SARS-Cov-2 infection and should not be used as the sole basis for treatment or other patient management decisions. A negative result may occur with  improper specimen collection/handling, submission of specimen other than nasopharyngeal swab, presence of viral mutation(s) within the areas targeted by this assay, and inadequate number of viral copies(<138 copies/mL). A negative result must be combined with clinical observations, patient history, and epidemiological information. The expected result is Negative.  Fact Sheet for Patients:  BloggerCourse.com  Fact Sheet for Healthcare Providers:  [employer]ious[employer]ker.it  This test is no t yet approved or cleared by the [place] FDA and  has been authorized for detection and/or diagnosis of SARS-CoV-2 by FDA under an Emergency Use Authorization (EUA). This EUA will remain  in effect (meaning this test can be used) for the duration of the COVID-19 declaration under Section 564(b)(1) of the Act, 21 U.S.C.section 360bbb-3(b)(1), unless the authorization is terminated  or revoked sooner.       Influenza A by PCR NEGATIVE NEGATIVE Final   Influenza B by PCR NEGATIVE NEGATIVE Final    Comment: (NOTE) The Xpert Xpress SARS-CoV-2/FLU/RSV plus assay is intended as an aid in the diagnosis of influenza from Nasopharyngeal swab specimens and should not be used as a sole basis for treatment. Nasal washings and aspirates are unacceptable for Xpert Xpress SARS-CoV-2/FLU/RSV testing.  Fact Sheet for  Patients: BloggerCourse.com  Fact Sheet for Healthcare Providers: [employer]ious[employer]ker.it  This test is not yet approved or cleared by the [place] FDA and has been authorized for detection and/or diagnosis of SARS-CoV-2 by FDA under an Emergency Use Authorization (EUA). This EUA will remain in effect (meaning this test can be used) for the duration of the COVID-19 declaration under Section 564(b)(1) of the Act, 21 U.S.C. section 360bbb-3(b)(1), unless the authorization is terminated or revoked.  Performed at [facility], [address] [place], [place] [address]   CSF culture     Status: None (Preliminary result)   Collection Time: 06/04/20  3:05 PM   Specimen: CSF; Cerebrospinal Fluid  Result Value Ref Range Status   Specimen Description   Final    CSF Performed at [facility], [address] [place], [place] [address]    Special Requests  Final    NONE Performed at [facility], [address], [place], [place] [address]    Gram Stain   Final    WBC PRESENT, PREDOMINANTLY MONONUCLEAR NO ORGANISMS SEEN CYTOSPIN SMEAR    Culture   Final    NO GROWTH < 24 HOURS Performed at [facility], [address] [place], [place] [address]    Report Status PENDING  Incomplete         Radiology Studies: CT Angio Head W or Wo Contrast  Result Date: 06/04/2020 CLINICAL DATA:  Neuro deficit, acute, stroke suspected; recent LVO. Additional provided: Right leg weakness. Fall today. EXAM: CT ANGIOGRAPHY HEAD AND NECK CT PERFUSION BRAIN TECHNIQUE: Multidetector CT imaging of the head and neck was performed using the standard protocol during bolus administration of intravenous contrast. Multiplanar CT image reconstructions and MIPs were obtained to evaluate the vascular anatomy. Carotid stenosis measurements (when applicable) are obtained utilizing NASCET criteria, using the  distal internal carotid diameter as the denominator. Multiphase CT imaging of the brain was performed following IV bolus contrast injection. Subsequent parametric perfusion maps were calculated using RAPID software. CONTRAST:  OMNIPAQUE IOHEXOL 350 MG/ML SOLN COMPARISON:  Prior CT angiogram head/neck 05/31/2020. FINDINGS: CTA NECK FINDINGS Aortic arch: Aberrant right subclavian artery. The visualized aortic arch is normal in caliber. No hemodynamically significant innominate or proximal subclavian artery stenosis. Right carotid system: CCA and ICA patent within the neck without stenosis. No significant atherosclerotic disease. Left carotid system: CCA and ICA patent within the neck without stenosis. No significant atherosclerotic disease. Vertebral arteries: Streak and beam hardening artifact arising from venous reflux limits evaluation of the V1 left vertebral artery. Within this limitation, the vertebral arteries are patent within the neck without hemodynamically significant stenosis. Skeleton: Cervical spondylosis. No acute bony abnormality or aggressive osseous lesion. Other neck: No neck mass or cervical lymphadenopathy. Upper chest: No consolidation within the imaged lung apices. Review of the MIP images confirms the above findings CTA HEAD FINDINGS Anterior circulation: The intracranial internal carotid arteries are patent. The M1 middle cerebral arteries are patent. Interval progression of a severe stenosis within a proximal left M2 MCA vessel (series 12, image 28). Multiple additional previously demonstrated severe stenoses within the bilateral M2 and M3 vessels. Hypoplastic right A1 segment. Unchanged appearance of a focal occlusion versus severe stenosis within the A2 left anterior cerebral artery (series 12, image 21). Progressive irregularity with multiple progressive severe stenoses within the left ACA vascular tree distal to this (series 12, image 21). No intracranial aneurysm is identified.  Posterior circulation: The intracranial vertebral arteries are patent. The basilar artery is patent. The posterior cerebral arteries are patent. Redemonstrated mild/moderate stenosis within the proximal P2 left PCA (series 10, image 60). Posterior communicating arteries are hypoplastic or absent bilaterally. Venous sinuses: Within the limitations of contrast timing, no convincing thrombus. Anatomic variants: As described Review of the MIP images confirms the above findings CT Brain Perfusion Findings: CBF (<30%) Volume: 0mL Perfusion (Tmax>6.0s) volume: 77mL (including areas within the left ACA vascular territory and right MCA vascular territory). Mismatch Volume: 77 mL Infarction Location:None identified These results were called by telephone at the time of interpretation on 06/04/2020 at 2:55 pm to provider Dr. Otelia Limes, Who verbally acknowledged these results. IMPRESSION: CTA neck: 1. The common carotid, internal carotid and vertebral arteries are patent within the neck without hemodynamically significant stenosis. 2. Aberrant right subclavian artery. CTA head: 1. Focal occlusion versus severe stenosis within the A2 left  anterior cerebral artery, similar in appearance as compared to the CTA of 05/31/2020. Progressive vessel irregularity with multiple progressive severe stenoses within the left ACA vascular tree distal to this. 2. Progressive severe focal stenosis within a proximal left M2 MCA vessel. 3. Multiple additional previously demonstrated severe stenoses within the bilateral M2 and M3 MCA vessels. 4. Mild/moderate stenosis within the proximal P2 left PCA. 5. The above stenoses may be secondary to vasculitis and/or atherosclerotic disease. CT perfusion head: No core infarct is identified. Areas of hypoperfusion totaling 77 mL within the left ACA vascular territory and right MCA vascular territory (utilizing the Tmax>6 seconds threshold). Electronically Signed   By: Jackey Loge DO   On: 06/04/2020 15:16    CT HEAD WO CONTRAST  Result Date: 06/03/2020 CLINICAL DATA:  Transient ischemic attack. EXAM: CT HEAD WITHOUT CONTRAST TECHNIQUE: Contiguous axial images were obtained from the base of the skull through the vertex without intravenous contrast. COMPARISON:  May 30, 2020. FINDINGS: Brain: Mild chronic ischemic white matter disease is noted. Old left posterior parietal infarction is noted. Stable left frontal low density is noted consistent with acute to subacute infarction as noted on prior MRI. No hemorrhage is noted. No mass effect or midline shift is noted. Ventricular size is within normal limits. Old left cerebellar infarction is noted. Vascular: No hyperdense vessel or unexpected calcification. Skull: Normal. Negative for fracture or focal lesion. Sinuses/Orbits: Bilateral ethmoid and maxillary sinusitis is noted. Other: None. IMPRESSION: Stable left frontal low density is noted consistent with acute to subacute infarction as noted on recent MRI. Old left cerebellar and posterior parietal infarctions are noted. No significant changes noted compared to prior exam. Electronically Signed   By: Lupita Raider M.D.   On: 06/03/2020 16:16   CT Angio Neck W and/or Wo Contrast  Result Date: 06/04/2020 CLINICAL DATA:  Neuro deficit, acute, stroke suspected; recent LVO. Additional provided: Right leg weakness. Fall today. EXAM: CT ANGIOGRAPHY HEAD AND NECK CT PERFUSION BRAIN TECHNIQUE: Multidetector CT imaging of the head and neck was performed using the standard protocol during bolus administration of intravenous contrast. Multiplanar CT image reconstructions and MIPs were obtained to evaluate the vascular anatomy. Carotid stenosis measurements (when applicable) are obtained utilizing NASCET criteria, using the distal internal carotid diameter as the denominator. Multiphase CT imaging of the brain was performed following IV bolus contrast injection. Subsequent parametric perfusion maps were calculated using  RAPID software. CONTRAST:  OMNIPAQUE IOHEXOL 350 MG/ML SOLN COMPARISON:  Prior CT angiogram head/neck 05/31/2020. FINDINGS: CTA NECK FINDINGS Aortic arch: Aberrant right subclavian artery. The visualized aortic arch is normal in caliber. No hemodynamically significant innominate or proximal subclavian artery stenosis. Right carotid system: CCA and ICA patent within the neck without stenosis. No significant atherosclerotic disease. Left carotid system: CCA and ICA patent within the neck without stenosis. No significant atherosclerotic disease. Vertebral arteries: Streak and beam hardening artifact arising from venous reflux limits evaluation of the V1 left vertebral artery. Within this limitation, the vertebral arteries are patent within the neck without hemodynamically significant stenosis. Skeleton: Cervical spondylosis. No acute bony abnormality or aggressive osseous lesion. Other neck: No neck mass or cervical lymphadenopathy. Upper chest: No consolidation within the imaged lung apices. Review of the MIP images confirms the above findings CTA HEAD FINDINGS Anterior circulation: The intracranial internal carotid arteries are patent. The M1 middle cerebral arteries are patent. Interval progression of a severe stenosis within a proximal left M2 MCA vessel (series 12, image 28). Multiple additional  previously demonstrated severe stenoses within the bilateral M2 and M3 vessels. Hypoplastic right A1 segment. Unchanged appearance of a focal occlusion versus severe stenosis within the A2 left anterior cerebral artery (series 12, image 21). Progressive irregularity with multiple progressive severe stenoses within the left ACA vascular tree distal to this (series 12, image 21). No intracranial aneurysm is identified. Posterior circulation: The intracranial vertebral arteries are patent. The basilar artery is patent. The posterior cerebral arteries are patent. Redemonstrated mild/moderate stenosis within the proximal  P2 left PCA (series 10, image 60). Posterior communicating arteries are hypoplastic or absent bilaterally. Venous sinuses: Within the limitations of contrast timing, no convincing thrombus. Anatomic variants: As described Review of the MIP images confirms the above findings CT Brain Perfusion Findings: CBF (<30%) Volume: 0mL Perfusion (Tmax>6.0s) volume: 77mL (including areas within the left ACA vascular territory and right MCA vascular territory). Mismatch Volume: 77 mL Infarction Location:None identified These results were called by telephone at the time of interpretation on 06/04/2020 at 2:55 pm to provider Dr. Otelia Limes, Who verbally acknowledged these results. IMPRESSION: CTA neck: 1. The common carotid, internal carotid and vertebral arteries are patent within the neck without hemodynamically significant stenosis. 2. Aberrant right subclavian artery. CTA head: 1. Focal occlusion versus severe stenosis within the A2 left anterior cerebral artery, similar in appearance as compared to the CTA of 05/31/2020. Progressive vessel irregularity with multiple progressive severe stenoses within the left ACA vascular tree distal to this. 2. Progressive severe focal stenosis within a proximal left M2 MCA vessel. 3. Multiple additional previously demonstrated severe stenoses within the bilateral M2 and M3 MCA vessels. 4. Mild/moderate stenosis within the proximal P2 left PCA. 5. The above stenoses may be secondary to vasculitis and/or atherosclerotic disease. CT perfusion head: No core infarct is identified. Areas of hypoperfusion totaling 77 mL within the left ACA vascular territory and right MCA vascular territory (utilizing the Tmax>6 seconds threshold). Electronically Signed   By: Jackey Loge DO   On: 06/04/2020 15:16   MR BRAIN W WO CONTRAST  Result Date: 06/04/2020 CLINICAL DATA:  Stroke EXAM: MRI HEAD WITHOUT AND WITH CONTRAST TECHNIQUE: Multiplanar, multiecho pulse sequences of the brain and surrounding structures  were obtained without and with intravenous contrast. CONTRAST:  10mL GADAVIST GADOBUTROL 1 MMOL/ML IV SOLN COMPARISON:  CT angio head and neck 06/04/2020 FINDINGS: Brain: Bilateral recent infarcts in the anterior and middle cerebral artery territories similar to the recent MRI. Small new areas of restricted diffusion in the right frontal cortex and in the right parietal white matter and cortex compatible with interval infarct since 05/30/2020. In addition, there is a new area of restricted diffusion in the left anterior cerebellum. Following contrast infusion, there is mild enhancement of several subacute infarcts and occluding the left anterior genu of the corpus callosum, left corona radiata, and left parietal lobe compatible with subacute infarct. Ventricle size normal. 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 This shows a slight amount of enhancement however there is volume loss in the majority of this appears to be a chronic infarct. Small area of chronic hemorrhagic infarct in the right temporoparietal lobe. Moderate chronic microvascular ischemic change in the white matter. Chronic left cerebellar infarct. Negative for mass lesion. Vascular: Normal arterial flow voids. Skull and upper cervical spine: Negative Sinuses/Orbits: Extensive mucosal edema throughout the paranasal sinuses. Air-fluid level right maxillary sinus. Negative orbit Other: None IMPRESSION: Multiple areas of subacute infarct in the anterior and middle cerebral arteries bilaterally as noted on the recent  MRI of 05/30/2020 Several small areas of acute infarct have developed since the prior MRI including the right frontal cortex, right parietal white matter and cortex, and the left cerebellum. Electronically Signed   By: Marlan Palau M.D.   On: 06/04/2020 17:55   CT CEREBRAL PERFUSION W CONTRAST  Result Date: 06/04/2020 CLINICAL DATA:  Neuro deficit, acute, stroke suspected; recent LVO. Additional provided: Right leg  weakness. Fall today. EXAM: CT ANGIOGRAPHY HEAD AND NECK CT PERFUSION BRAIN TECHNIQUE: Multidetector CT imaging of the head and neck was performed using the standard protocol during bolus administration of intravenous contrast. Multiplanar CT image reconstructions and MIPs were obtained to evaluate the vascular anatomy. Carotid stenosis measurements (when applicable) are obtained utilizing NASCET criteria, using the distal internal carotid diameter as the denominator. Multiphase CT imaging of the brain was performed following IV bolus contrast injection. Subsequent parametric perfusion maps were calculated using RAPID software. CONTRAST:  OMNIPAQUE IOHEXOL 350 MG/ML SOLN COMPARISON:  Prior CT angiogram head/neck 05/31/2020. FINDINGS: CTA NECK FINDINGS Aortic arch: Aberrant right subclavian artery. The visualized aortic arch is normal in caliber. No hemodynamically significant innominate or proximal subclavian artery stenosis. Right carotid system: CCA and ICA patent within the neck without stenosis. No significant atherosclerotic disease. Left carotid system: CCA and ICA patent within the neck without stenosis. No significant atherosclerotic disease. Vertebral arteries: Streak and beam hardening artifact arising from venous reflux limits evaluation of the V1 left vertebral artery. Within this limitation, the vertebral arteries are patent within the neck without hemodynamically significant stenosis. Skeleton: Cervical spondylosis. No acute bony abnormality or aggressive osseous lesion. Other neck: No neck mass or cervical lymphadenopathy. Upper chest: No consolidation within the imaged lung apices. Review of the MIP images confirms the above findings CTA HEAD FINDINGS Anterior circulation: The intracranial internal carotid arteries are patent. The M1 middle cerebral arteries are patent. Interval progression of a severe stenosis within a proximal left M2 MCA vessel (series 12, image 28). Multiple additional  previously demonstrated severe stenoses within the bilateral M2 and M3 vessels. Hypoplastic right A1 segment. Unchanged appearance of a focal occlusion versus severe stenosis within the A2 left anterior cerebral artery (series 12, image 21). Progressive irregularity with multiple progressive severe stenoses within the left ACA vascular tree distal to this (series 12, image 21). No intracranial aneurysm is identified. Posterior circulation: The intracranial vertebral arteries are patent. The basilar artery is patent. The posterior cerebral arteries are patent. Redemonstrated mild/moderate stenosis within the proximal P2 left PCA (series 10, image 60). Posterior communicating arteries are hypoplastic or absent bilaterally. Venous sinuses: Within the limitations of contrast timing, no convincing thrombus. Anatomic variants: As described Review of the MIP images confirms the above findings CT Brain Perfusion Findings: CBF (<30%) Volume: 65mL Perfusion (Tmax>6.0s) volume: 86mL (including areas within the left ACA vascular territory and right MCA vascular territory). Mismatch Volume: 77 mL Infarction Location:None identified These results were called by telephone at the time of interpretation on 06/04/2020 at 2:55 pm to provider Dr. Otelia Limes, Who verbally acknowledged these results. IMPRESSION: CTA neck: 1. The common carotid, internal carotid and vertebral arteries are patent within the neck without hemodynamically significant stenosis. 2. Aberrant right subclavian artery. CTA head: 1. Focal occlusion versus severe stenosis within the A2 left anterior cerebral artery, similar in appearance as compared to the CTA of 05/31/2020. Progressive vessel irregularity with multiple progressive severe stenoses within the left ACA vascular tree distal to this. 2. Progressive severe focal stenosis within a proximal left M2  MCA vessel. 3. Multiple additional previously demonstrated severe stenoses within the bilateral M2 and M3 MCA  vessels. 4. Mild/moderate stenosis within the proximal P2 left PCA. 5. The above stenoses may be secondary to vasculitis and/or atherosclerotic disease. CT perfusion head: No core infarct is identified. Areas of hypoperfusion totaling 77 mL within the left ACA vascular territory and right MCA vascular territory (utilizing the Tmax>6 seconds threshold). Electronically Signed   By: Jackey Loge DO   On: 06/04/2020 15:16   CT HEAD CODE STROKE WO CONTRAST  Result Date: 06/04/2020 CLINICAL DATA:  Code stroke. Neuro deficit, acute, stroke suspected. Additional history provided: Last known normal 1:15 p.m., weakness, fall in part today, 3 recent strokes. EXAM: CT HEAD WITHOUT CONTRAST TECHNIQUE: Contiguous axial images were obtained from the base of the skull through the vertex without intravenous contrast. COMPARISON:  CT angiogram head/neck 05/31/2020. Brain MRI 05/30/2020. FINDINGS: Brain: Mild cerebral and cerebellar atrophy. No interval acute demarcated cortical infarct is identified. No evidence of acute intracranial hemorrhage. Redemonstrated subacute infarction changes within the anterior left frontal lobe white matter and left callosal body/genu. Additional known subacute infarcts within the bilateral cerebral hemispheres were better appreciated on the brain MRI of 05/30/2020. Redemonstrated chronic cortical/subcortical infarct within the left parietal lobe. Stable background advanced chronic small vessel ischemic disease within the cerebral white matter, including multiple chronic small-vessel infarcts within the bilateral cerebral white matter. Redemonstrated chronic infarct within the left cerebellum. No extra-axial fluid collection. No evidence of intracranial mass. No midline shift. Vascular: No hyperdense vessel. Skull: Normal. Negative for fracture or focal lesion. Sinuses/Orbits: Visualized orbits show no acute finding. Pansinusitis. Most notably, there is severe mucosal thickening within the left  frontal sinus, within the bilateral ethmoid air cells and within the bilateral maxillary sinuses. Superimposed scattered small volume frothy secretions. ASPECTS [facility] Stroke Program Early CT Score) - Ganglionic level infarction (caudate, lentiform nuclei, internal capsule, insula, M1-M3 cortex): 7 - Supraganglionic infarction (M4-M6 cortex): 3 Total score (0-10 with 10 being normal): 10 (when discounting subacute and chronic infarcts). These results were called by telephone at the time of interpretation on 06/04/2020 at 2:08 pm to provider DAVID YAO , who verbally acknowledged these results. IMPRESSION: Comparison is made to the prior CT head of 05/31/2020. No appreciable interval acute infarct. Redemonstrated subacute infarcts within the anterior left frontal lobe white matter and left callosal body/genu. Additional known subacute infarcts within the bilateral cerebral hemispheres were better appreciated on the brain MRI of 05/30/2020. Redemonstrated chronic cortical/subcortical infarct within the left parietal lobe. Stable background advanced cerebral white matter chronic small vessel ischemic disease. Redemonstrated chronic left cerebellar infarct. Stable mild generalized parenchymal atrophy. Pansinusitis. Electronically Signed   By: Jackey Loge DO   On: 06/04/2020 14:09        Scheduled Meds: . aspirin  325 mg Oral Daily  . atorvastatin  80 mg Oral Daily  . clopidogrel  75 mg Oral Daily  . insulin aspart  0-15 Units Subcutaneous TID AC & HS  . insulin glargine  10 Units Subcutaneous Daily  . levETIRAcetam  500 mg Oral BID   Continuous Infusions: . methylPREDNISolone (SOLU-MEDROL) injection 1,000 mg (06/05/20 1019)          Glade Lloyd, MD [facility] 06/05/2020, 11:12 AM

## 2020-06-05 NOTE — Progress Notes (Signed)
Pt.has standing order for transfer to [facility]. Pt. Family was notified of the transfer however refused to, significant other [name]) requested to talk with the Attending. Will re - approach family again once bed is available in [place].

## 2020-06-05 NOTE — Plan of Care (Signed)
  Problem: Self-Care: Goal: Ability to communicate needs accurately will improve Outcome: Progressing   Problem: Coping: Goal: Level of anxiety will decrease Outcome: Progressing   Problem: Safety: Goal: Ability to remain free from injury will improve Outcome: Progressing

## 2020-06-05 NOTE — Progress Notes (Signed)
Inpatient Rehab Admissions Coordinator Note:   Per PT/OT recommendations, pt was screened for CIR candidacy by Wolfgang Phoenix, MS,CCC-SLP.  At this time we are recommending an inpatient rehab consult. AC will place consult order per protocol.  Please contact me with questions.    Wolfgang Phoenix, MS, CCC-SLP Admissions Coordinator [phone] 06/05/20 4:48 PM

## 2020-06-05 NOTE — Progress Notes (Signed)
Initial Nutrition Assessment  DOCUMENTATION CODES:   Obesity unspecified  INTERVENTION:   -Glucerna Shake po BID, each supplement provides 220 kcal and 10 grams of protein -Ensure Max po daily, each supplement provides 150 kcal and 30 grams of protein -MVI with minerals daily  NUTRITION DIAGNOSIS:   Increased nutrient needs related to chronic illness (DM, HTN, multiple CVAs) as evidenced by estimated needs.  GOAL:   Patient will meet greater than or equal to 90% of their needs  MONITOR:   PO intake,Supplement acceptance,Labs,Weight trends,Skin,I & O's  REASON FOR ASSESSMENT:   Malnutrition Screening Tool    ASSESSMENT:   45 year old male with past medical history of diabetes mellitus type 2, hyperlipidemia, hypertension, cocaine abuse, obesity, nicotine dependence and multiple recent strokes requiring hospitalization who presents to [facility] long hospital emergency department with complaints of right lower extremity weakness and fall, 24 hours after discharge from [facility] for a recently diagnosed stroke.  Pt admitted with intermittent expressive and receptive aphasia with associated lt sided weakness. Pt with recent diagnosis of multiple strokes.   Reviewed I/O's: -245 ml x 24 hours  UOP: 300 ml x 24 hours  Pt unavailable at time of visit. Attempted to speak with pt via call to hospital room phone, however, unable to reach. RD unable to obtain further nutrition-related history or complete nutrition-focused physical exam at this time.   Pt with good appetite. Noted meal completion 75-100%.   Reviewed wt hx; pt has experienced a 10% wt loss over the past 4 months, which is significant for time frame.   Medications reviewed and include keppra and IV solumedrol.   Lab Results  Component Value Date   HGBA1C 10.3 (H) 05/31/2020   PTA DM medications are 10 units insulin glargine daily and 1000 mg metformin BID.   Labs reviewed: CBGS: 207-227 (inpatient orders  for glycemic control are 0-15 units insulin aspart TID before meals and at bedtime and 10 units insulin glargine daily).   Diet Order:   Diet Order            Diet heart healthy/carb modified Room service appropriate? Yes; Fluid consistency: Thin  Diet effective now                 EDUCATION NEEDS:   No education needs have been identified at this time  Skin:  Skin Assessment: Reviewed RN Assessment  Last BM:  06/05/20  Height:   Ht Readings from Last 1 Encounters:  06/04/20 6\' 1"  (1.854 m)    Weight:   Wt Readings from Last 1 Encounters:  06/04/20 110.3 kg    Ideal Body Weight:  83.6 kg  BMI:  Body mass index is 32.08 kg/m.  Estimated Nutritional Needs:   Kcal:  2300-2500  Protein:  110-125 grams  Fluid:  > 2 L    06/06/20, RD, LDN, CDCES Registered Dietitian II Certified Diabetes Care and Education Specialist Please refer to [facility] for RD and/or RD on-call/weekend/after hours pager

## 2020-06-06 LAB — BASIC METABOLIC PANEL
Anion gap: 6 (ref 5–15)
BUN: 29 mg/dL — ABNORMAL HIGH (ref 6–20)
CO2: 26 mmol/L (ref 22–32)
Calcium: 9 mg/dL (ref 8.9–10.3)
Chloride: 104 mmol/L (ref 98–111)
Creatinine, Ser: 1.27 mg/dL — ABNORMAL HIGH (ref 0.61–1.24)
GFR, Estimated: >60 mL/min (ref >60)
Glucose, Bld: 240 mg/dL — ABNORMAL HIGH (ref 70–99)
Potassium: 3.8 mmol/L (ref 3.5–5.1)
Sodium: 136 mmol/L (ref 135–145)

## 2020-06-06 LAB — MAGNESIUM: Magnesium: 2.1 mg/dL (ref 1.7–2.4)

## 2020-06-06 LAB — CBC WITH DIFFERENTIAL/PLATELET
Abs Immature Granulocytes: 0.11 10*3/uL — ABNORMAL HIGH (ref 0.00–0.07)
Basophils Absolute: 0 10*3/uL (ref 0.0–0.1)
Basophils Relative: 0 %
Eosinophils Absolute: 0 10*3/uL (ref 0.0–0.5)
Eosinophils Relative: 0 %
HCT: 34.3 % — ABNORMAL LOW (ref 39.0–52.0)
Hemoglobin: 11.3 g/dL — ABNORMAL LOW (ref 13.0–17.0)
Immature Granulocytes: 1 %
Lymphocytes Relative: 8 %
Lymphs Abs: 1.1 10*3/uL (ref 0.7–4.0)
MCH: 25.9 pg — ABNORMAL LOW (ref 26.0–34.0)
MCHC: 32.9 g/dL (ref 30.0–36.0)
MCV: 78.5 fL — ABNORMAL LOW (ref 80.0–100.0)
Monocytes Absolute: 0.5 10*3/uL (ref 0.1–1.0)
Monocytes Relative: 4 %
Neutro Abs: 11.7 10*3/uL — ABNORMAL HIGH (ref 1.7–7.7)
Neutrophils Relative %: 87 %
Platelets: 305 10*3/uL (ref 150–400)
RBC: 4.37 MIL/uL (ref 4.22–5.81)
RDW: 12.3 % (ref 11.5–15.5)
WBC: 13.3 10*3/uL — ABNORMAL HIGH (ref 4.0–10.5)
nRBC: 0 % (ref 0.0–0.2)

## 2020-06-06 LAB — C4 COMPLEMENT: Complement C4, Body Fluid: 35 mg/dL (ref 12–38)

## 2020-06-06 LAB — GLUCOSE, CAPILLARY
Glucose-Capillary: 223 mg/dL — ABNORMAL HIGH (ref 70–99)
Glucose-Capillary: 276 mg/dL — ABNORMAL HIGH (ref 70–99)
Glucose-Capillary: 293 mg/dL — ABNORMAL HIGH (ref 70–99)
Glucose-Capillary: 343 mg/dL — ABNORMAL HIGH (ref 70–99)

## 2020-06-06 LAB — CARDIOLIPIN ANTIBODIES, IGG, IGM, IGA
Anticardiolipin IgA: <9 APL U/mL (ref 0–11)
Anticardiolipin IgG: <9 GPL U/mL (ref 0–14)
Anticardiolipin IgM: <9 MPL U/mL (ref 0–12)

## 2020-06-06 LAB — C3 COMPLEMENT: C3 Complement: 203 mg/dL — ABNORMAL HIGH (ref 82–167)

## 2020-06-06 MED ORDER — SODIUM CHLORIDE 0.9 % IV SOLN: 1000.0000 mg | Freq: Every day | INTRAVENOUS | Status: DC

## 2020-06-06 MED ORDER — INSULIN GLARGINE 100 UNIT/ML ~~LOC~~ SOLN: 15.0000 [IU] | Freq: Every day | SUBCUTANEOUS | Status: DC

## 2020-06-06 MED ORDER — ATORVASTATIN CALCIUM 40 MG PO TABS: 80.0000 mg | ORAL_TABLET | Freq: Every day | ORAL | Status: DC

## 2020-06-06 MED ORDER — BASAGLAR KWIKPEN 100 UNIT/ML ~~LOC~~ SOPN: 15.0000 [IU] | PEN_INJECTOR | Freq: Every day | SUBCUTANEOUS | 2 refills | Status: DC

## 2020-06-06 NOTE — Progress Notes (Signed)
Inpatient Rehab Admissions:  Inpatient Rehab Consult received.  I called pt's room at [facility]. [name] aunt, Andrey Campanile answered the phone.  Discussed CIR goals and expectations of an inpatient rehab admission with her. She relayed information to patient. Pt is interested in receiving therapy at an inpatient rehab.  Given permission to contact pt's significant other, [name].  Spoke with her on the telephone regarding CIR.  She acknowledged understanding.  She informed AC that family wants pt transferred to [facility] for further medical workup.  AC will not sign off yet in case the transfer is not able to occur.  Will continue to follow.  Signed: Wolfgang Phoenix, MS, CCC-SLP Admissions Coordinator [phone]

## 2020-06-06 NOTE — Plan of Care (Signed)
  Problem: Education: Goal: Knowledge of disease or condition will improve Outcome: Progressing Goal: Knowledge of secondary prevention will improve Outcome: Progressing   Problem: Self-Care: Goal: Ability to communicate needs accurately will improve Outcome: Progressing   Problem: Education: Goal: Knowledge of disease or condition will improve Outcome: Progressing Goal: Knowledge of secondary prevention will improve Outcome: Progressing Goal: Knowledge of patient specific risk factors addressed and post discharge goals established will improve Outcome: Progressing   Problem: Coping: Goal: Will verbalize positive feelings about self Outcome: Progressing Goal: Will identify appropriate support needs Outcome: Progressing   Problem: Education: Goal: Knowledge of disease or condition will improve Outcome: Progressing Goal: Knowledge of patient specific risk factors addressed and post discharge goals established will improve Outcome: Progressing   Problem: Health Behavior/Discharge Planning: Goal: Ability to manage health-related needs will improve Outcome: Progressing   Problem: Clinical Measurements: Goal: Ability to maintain clinical measurements within normal limits will improve Outcome: Progressing Goal: Diagnostic test results will improve Outcome: Progressing Goal: Cardiovascular complication will be avoided Outcome: Progressing   Problem: Activity: Goal: Risk for activity intolerance will decrease Outcome: Progressing   Problem: Nutrition: Goal: Adequate nutrition will be maintained Outcome: Progressing   Problem: Coping: Goal: Level of anxiety will decrease Outcome: Progressing   Problem: Safety: Goal: Ability to remain free from injury will improve Outcome: Progressing

## 2020-06-06 NOTE — Plan of Care (Signed)
  Problem: Self-Care: Goal: Ability to communicate needs accurately will improve Outcome: Progressing   Problem: Education: Goal: Knowledge of disease or condition will improve Outcome: Progressing

## 2020-06-06 NOTE — Progress Notes (Addendum)
Report given to [facility] transport services prior to patient being transferred. Pt is stable, informed that transport will be here within the next 45 mins.  Report given to nurse Bouathong @ [facility] prior to patient transfer. Nurse informed of patient's status, patient is stable and will be transferred in the next hour. Sister given update via phone call to inform her of patient's status and transfer.

## 2020-06-06 NOTE — Discharge Summary (Addendum)
Physician Discharge Summary  Nathaniel Spears WGN:[id number] DOB: 05-29-1975 DOA: 06/04/2020  PCP: Kerin Perna, NP  Admit date: 06/04/2020 Discharge date: 06/06/2020  Admitted From: Home Disposition: [facility]  Accepting provider: Dr. Phineas Inches (hospitalist)  Recommendations for Outpatient Follow-up:  1. Follow up with provider at [facility] along with neurologist at [facility] [place]: No Equipment/Devices: None  Discharge Condition: Stable CODE STATUS: Full Diet recommendation: Heart healthy/carb modified  Brief/Interim Summary: 45 year old male with history of diabetes mellitus type 2, hyperlipidemia, hypertension, cocaine abuse, obesity, nicotine dependence and multiple recent strokes requiring hospitalization presented on 06/04/2020 with right lower extremity weakness and fall, 24 hours after discharge from [facility] for recently diagnosed stroke.  Of note, patient was hospitalized at [facility] from 4/5 until 04/23/2020 after presenting with aphasia. Patient was identified to be suffering from an acute stroke of the left middle frontal lobe. Patient was initiated on dual antiplatelet therapy and eventually discharged on 4/8. Patient then presented again to [facility] on 5/15 with symptoms of left lower extremity weakness, dizziness falling and recurrent aphasia. On this presentation a repeat MRI was obtained demonstrating ischemia in the [place] distribution as well as his previous MCA stroke. Patient was eventually discharged from [facility] on 5/19.  Shortly after discharge on 5/19 patient presented via EMS back to [facility] emergency department after the family noticed patient was having difficulty with speech and acting strangely. On that evaluation, case was reviewed with neurology and no further imaging was recommended. Initiation of Keppra for seizure prophylaxis was recommended however and  patient was discharged home.  On 5/20, patient was with family at home in the early afternoon when he suddenly fellwhile walking. This was a witnessed fall by the patient's girlfriend. Patient once again began to exhibit difficulty with speech. Patient was then brought into [facility] emergency department.  On presentation, patient was evaluated by teleneurology. A stat CT angiogram of the head and neck was obtained as well as cerebral perfusion imaging and noncontrast CT imaging of the head. Imaging revealed that many of the areas of cerebrovascular disease were unchanged however it also identified several areas of progressive disease.Neurology recommended hospitalization for repeat MRI and additional work-up. They also recommended a vasculitis panel and lumbar puncture. A lumbar puncture was performed in the emergency department as recommended. Patient was to be admitted to [facility] however due to the patient and family's dissatisfaction with [facility] they instead requested that the patient be transferred to [facility] for further care. After the ER provider attempted this and it was unsuccessful, family then agreed for patient to be admitted to [facility] for further management instead of going to [facility]. This was approved by Dr. Cheral Marker with neurology.   Family has continued to request transfer to [facility].  He will be transferred to [facility] once bed is available.   Discharge Diagnoses:   Acute recurrent ischemic strokes -Presented with intermittent expressive and receptive aphasia with associated left-sided weakness and fall. -Apparently he is compliant with his home aspirin, Plavix and statin therapy and now abstinent of cocaine use and has quit smoking. -Noncontrast CT head with CTA and perfusion imaging here in the emergency department concerning for progressive cerebrovascular process with possible acute or subacute strokes-overall processes  concerning for possible underlying vasculitis/autoimmune disease or hypercoagulable state. -MRI of brain shows multiple areas of subacute infarct as noted in the prior MRI of 05/30/2020 along  with several small areas of acute infarct have developed since the prior MRI.  Patient initially refused admission to [facility] as recommended by teleneurology and wanted to be transferred to [facility] which was attempted by ER provider without success.  Eventually patient was admitted to [facility]. -Neurology team will see the patient once patient gets transferred to [facility].  Family still does not want the patient to be transferred to [facility] and still requesting that patient be transferred to [facility]. -Status post LP in the ED as per tele neurology recommendations and currently on high-dose Solu-Medrol as well. Send out labs from LP are pending -Continue aspirin, statin, Plavix and Keppra.  PT/OT recommending CIR placement.  Diet as per SLP recommendations -He will be transferred to [facility] once bed is available.   Essential hypertension -Allow for permissive hypertension. -Blood pressure currently intermittently elevated.  Diabetes mellitus type 2 with hyperglycemia -Increased Lantus to 15 units daily and continued CBGs with SSI.  A1c was 10.3 on 05/31/2020  Nicotine dependence -Apparently recently quit smoking  History of cocaine abuse -Recently quit reportedly.  Discharge Instructions   Allergies as of 06/06/2020   No Known Allergies     Medication List    STOP taking these medications   amLODipine 10 MG tablet Commonly known as: NORVASC   losartan-hydrochlorothiazide 100-25 MG tablet Commonly known as: HYZAAR   metFORMIN 1000 MG tablet Commonly known as: GLUCOPHAGE     TAKE these medications   acetaminophen 325 MG tablet Commonly known as: TYLENOL Take 2 tablets (650 mg total) by mouth every 4 (four) hours as needed  for mild pain (or temp > 37.5 C (99.5 F)).   aspirin 325 MG EC tablet Take 1 tablet (325 mg total) by mouth daily.   atorvastatin 40 MG tablet Commonly known as: LIPITOR Take 2 tablets (80 mg total) by mouth daily. What changed:   how much to take  Another medication with the same name was removed. Continue taking this medication, and follow the directions you see here.   Basaglar KwikPen 100 UNIT/ML Inject 15 Units into the skin daily. What changed: how much to take   blood glucose meter kit and supplies Dispense based on patient and insurance preference. Use up to four times daily as directed. (FOR ICD-10 E10.9, E11.9).   clopidogrel 75 MG tablet Commonly known as: PLAVIX Take 1 tablet (75 mg total) by mouth daily.   levETIRAcetam 500 MG tablet Commonly known as: Keppra Take 1 tablet (500 mg total) by mouth 2 (two) times daily.   methylPREDNISolone sodium succinate 1,000 mg in sodium chloride 0.9 % 50 mL Inject 1,000 mg into the vein daily. Start taking on: Jun 07, 2020   True Metrix Blood Glucose Test test strip Generic drug: glucose blood Use to check blood sugar TID.   True Metrix Meter w/Device Kit Use to check blood sugar TID.   TRUEplus Lancets 28G Misc Use to check blood sugar 3 times daily.        No Known Allergies  Consultations:  Teleneurology   Procedures/Studies: CT Angio Head W or Wo Contrast  Result Date: 06/04/2020 CLINICAL DATA:  Neuro deficit, acute, stroke suspected; recent LVO. Additional provided: Right leg weakness. Fall today. EXAM: CT ANGIOGRAPHY HEAD AND NECK CT PERFUSION BRAIN TECHNIQUE: Multidetector CT imaging of the head and neck was performed using the standard protocol during bolus administration of intravenous contrast. Multiplanar CT image reconstructions and MIPs were obtained to  evaluate the vascular anatomy. Carotid stenosis measurements (when applicable) are obtained utilizing NASCET criteria, using the distal internal  carotid diameter as the denominator. Multiphase CT imaging of the brain was performed following IV bolus contrast injection. Subsequent parametric perfusion maps were calculated using RAPID software. CONTRAST:  166m OMNIPAQUE IOHEXOL 350 MG/ML SOLN COMPARISON:  Prior CT angiogram head/neck 05/31/2020. FINDINGS: CTA NECK FINDINGS Aortic arch: Aberrant right subclavian artery. The visualized aortic arch is normal in caliber. No hemodynamically significant innominate or proximal subclavian artery stenosis. Right carotid system: CCA and ICA patent within the neck without stenosis. No significant atherosclerotic disease. Left carotid system: CCA and ICA patent within the neck without stenosis. No significant atherosclerotic disease. Vertebral arteries: Streak and beam hardening artifact arising from venous reflux limits evaluation of the V1 left vertebral artery. Within this limitation, the vertebral arteries are patent within the neck without hemodynamically significant stenosis. Skeleton: Cervical spondylosis. No acute bony abnormality or aggressive osseous lesion. Other neck: No neck mass or cervical lymphadenopathy. Upper chest: No consolidation within the imaged lung apices. Review of the MIP images confirms the above findings CTA HEAD FINDINGS Anterior circulation: The intracranial internal carotid arteries are patent. The M1 middle cerebral arteries are patent. Interval progression of a severe stenosis within a proximal left M2 MCA vessel (series 12, image 28). Multiple additional previously demonstrated severe stenoses within the bilateral M2 and M3 vessels. Hypoplastic right A1 segment. Unchanged appearance of a focal occlusion versus severe stenosis within the A2 left anterior cerebral artery (series 12, image 21). Progressive irregularity with multiple progressive severe stenoses within the left ACA vascular tree distal to this (series 12, image 21). No intracranial aneurysm is identified. Posterior  circulation: The intracranial vertebral arteries are patent. The basilar artery is patent. The posterior cerebral arteries are patent. Redemonstrated mild/moderate stenosis within the proximal P2 left PCA (series 10, image 60). Posterior communicating arteries are hypoplastic or absent bilaterally. Venous sinuses: Within the limitations of contrast timing, no convincing thrombus. Anatomic variants: As described Review of the MIP images confirms the above findings CT Brain Perfusion Findings: CBF (<30%) Volume: 051mPerfusion (Tmax>6.0s) volume: 7727mincluding areas within the left ACA vascular territory and right MCA vascular territory). Mismatch Volume: 77 mL Infarction Location:None identified These results were called by telephone at the time of interpretation on 06/04/2020 at 2:55 pm to provider Dr. LinCheral Markerho verbally acknowledged these results. IMPRESSION: CTA neck: 1. The common carotid, internal carotid and vertebral arteries are patent within the neck without hemodynamically significant stenosis. 2. Aberrant right subclavian artery. CTA head: 1. Focal occlusion versus severe stenosis within the A2 left anterior cerebral artery, similar in appearance as compared to the CTA of 05/31/2020. Progressive vessel irregularity with multiple progressive severe stenoses within the left ACA vascular tree distal to this. 2. Progressive severe focal stenosis within a proximal left M2 MCA vessel. 3. Multiple additional previously demonstrated severe stenoses within the bilateral M2 and M3 MCA vessels. 4. Mild/moderate stenosis within the proximal P2 left PCA. 5. The above stenoses may be secondary to vasculitis and/or atherosclerotic disease. CT perfusion head: No core infarct is identified. Areas of hypoperfusion totaling 77 mL within the left ACA vascular territory and right MCA vascular territory (utilizing the Tmax>6 seconds threshold). Electronically Signed   By: KylKellie Simmering   On: 06/04/2020 15:16   CT ANGIO  HEAD NECK W WO CM  Result Date: 05/31/2020 CLINICAL DATA:  Focal neurological deficit CT, more than 6 hours, stroke suspected. EXAM: CT  ANGIOGRAPHY HEAD AND NECK TECHNIQUE: Multidetector CT imaging of the head and neck was performed using the standard protocol during bolus administration of intravenous contrast. Multiplanar CT image reconstructions and MIPs were obtained to evaluate the vascular anatomy. Carotid stenosis measurements (when applicable) are obtained utilizing NASCET criteria, using the distal internal carotid diameter as the denominator. CONTRAST:  47m OMNIPAQUE IOHEXOL 350 MG/ML SOLN COMPARISON:  MRI of the brain May 30, 2020 FINDINGS: CT HEAD FINDINGS Brain: Multiple hypodense foci are seen along the bilateral ACA and left MCA territory, corresponding to infarct seen on recent MRI of the brain. Multiple additional hypodense foci are seen in the bilateral cerebral hemispheres, corresponding to chronic infarcts. Area as of encephalomalacia and gliosis in the left parietooccipital region and inferior left cerebellar hemisphere. No hemorrhage, hydrocephalus, extra-axial collection or mass lesion. Vascular: No hyperdense vessel. Skull: Normal. Negative for fracture or focal lesion. Sinuses: Mucosal thickening throughout the paranasal sinuses. Orbits: No acute finding. 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 Image portion shows no evidence of aneurysm or dissection. No significant stenosis at the origin of the major arch vessels. Right carotid system: No evidence of dissection, stenosis or occlusion. Left carotid system: No evidence of dissection, stenosis or occlusion. Vertebral arteries: Left dominant. No evidence of dissection, stenosis (50% or greater) or occlusion. Skeleton: Degenerative changes of the  cervical spine at C5-6 and C6-7. No acute or aggressive process identified. Other neck: Mildly prominent bilateral cervical lymph nodes, nonspecific. May be reactive. Prominent adenoid tissue. Upper chest: Negative. Review of the MIP images confirms the above findings CTA HEAD FINDINGS Anterior circulation: Intracranial bilateral internal carotid arteries abnormal course and caliber. Bilateral A1 segments are patent noting a hypoplastic right A1/ACA. Interval development of a new focal occlusion versus high-grade stenosis at the mid A2 segment of the left anterior cerebral artery with normal distal opacification. Luminal irregularity is noted along the bilateral ACA vascular trees. Interval worsening of bilateral multifocal areas of stenosis of the bilateral MCA vascular tree preserving the bilateral M1 segments with severe stenosis at the proximal left M2 segment and persistent occlusion at the distal left M3/MCA superior division branch. Severe stenosis at the proximal right M2/MCA superior division branch and M3/MCA posterior division branches. Posterior circulation: The intracranial bilateral vertebral arteries and basilar artery are maintained. Luminal irregularity is seen along the bilateral posterior cerebral arteries with multifocal areas of mild stenosis. Venous sinuses: Poorly opacified. Review of the MIP images confirms the above findings IMPRESSION: 1. Multiple hypodense foci along the bilateral ACA and left MCA vascular trees correlating with acute/subacute infarcts described on recent MRI. 2. Remote small lacunar infarct in the bilateral cerebral and left cerebellar hemisphere, as described above. 3. No hemodynamically significant stenosis in the major neck arteries. 4. Prominent luminal irregularity of the intracranial vasculature, may represent advanced atherosclerotic disease versus vasculitis. 5. New occlusion versus high-grade stenosis at the left A2/ACA segment. 6. Multifocal areas of high-grade  stenosis the bilateral MCA vascular tree, as described above, with persistent occlusion of the distal left M3/MCA superior division branch and worsening stenosis of right M3/M branches. Electronically Signed   By: KPedro EarlsM.D.   On: 05/31/2020 18:11   CT HEAD WO CONTRAST  Result Date: 06/03/2020 CLINICAL DATA:  Transient ischemic attack. EXAM: CT HEAD WITHOUT CONTRAST TECHNIQUE: Contiguous  axial images were obtained from the base of the skull through the vertex without intravenous contrast. COMPARISON:  May 30, 2020. FINDINGS: Brain: Mild chronic ischemic white matter disease is noted. Old left posterior parietal infarction is noted. Stable left frontal low density is noted consistent with acute to subacute infarction as noted on prior MRI. No hemorrhage is noted. No mass effect or midline shift is noted. Ventricular size is within normal limits. Old left cerebellar infarction is noted. Vascular: No hyperdense vessel or unexpected calcification. Skull: Normal. Negative for fracture or focal lesion. Sinuses/Orbits: Bilateral ethmoid and maxillary sinusitis is noted. Other: None. IMPRESSION: Stable left frontal low density is noted consistent with acute to subacute infarction as noted on recent MRI. Old left cerebellar and posterior parietal infarctions are noted. No significant changes noted compared to prior exam. Electronically Signed   By: Marijo Conception M.D.   On: 06/03/2020 16:16   CT HEAD WO CONTRAST  Result Date: 05/30/2020 CLINICAL DATA:  Mental status changes. EXAM: CT HEAD WITHOUT CONTRAST TECHNIQUE: Contiguous axial images were obtained from the base of the skull through the vertex without intravenous contrast. COMPARISON:  04/21/2020 FINDINGS: Brain: Periventricular white matter changes are consistent with small vessel disease. Remote infarcts involving the LEFT frontal lobe, LEFT posterior parietal lobe, LEFT cerebellum. Stable appearance of patchy low-attenuation in the  RIGHT corona radiata. There is no intra or extra-axial fluid collection or mass lesion. The basilar cisterns and ventricles have a normal appearance. There is no CT evidence for acute infarction or hemorrhage. Vascular: No hyperdense vessel or unexpected calcification. Skull: Normal. Negative for fracture or focal lesion. Sinuses/Orbits: Moderate mucosal thickening of the paranasal sinuses. No air-fluid levels. Other: None. IMPRESSION: 1. Stable appearance of chronic LEFT infarcts. 2. Periventricular white matter changes are stable. 3.  No evidence for acute intracranial abnormality. 4. Chronic sinus changes. Electronically Signed   By: Nolon Nations M.D.   On: 05/30/2020 14:34   CT Angio Neck W and/or Wo Contrast  Result Date: 06/04/2020 CLINICAL DATA:  Neuro deficit, acute, stroke suspected; recent LVO. Additional provided: Right leg weakness. Fall today. EXAM: CT ANGIOGRAPHY HEAD AND NECK CT PERFUSION BRAIN TECHNIQUE: Multidetector CT imaging of the head and neck was performed using the standard protocol during bolus administration of intravenous contrast. Multiplanar CT image reconstructions and MIPs were obtained to evaluate the vascular anatomy. Carotid stenosis measurements (when applicable) are obtained utilizing NASCET criteria, using the distal internal carotid diameter as the denominator. Multiphase CT imaging of the brain was performed following IV bolus contrast injection. Subsequent parametric perfusion maps were calculated using RAPID software. CONTRAST:  122m OMNIPAQUE IOHEXOL 350 MG/ML SOLN COMPARISON:  Prior CT angiogram head/neck 05/31/2020. FINDINGS: CTA NECK FINDINGS Aortic arch: Aberrant right subclavian artery. The visualized aortic arch is normal in caliber. No hemodynamically significant innominate or proximal subclavian artery stenosis. Right carotid system: CCA and ICA patent within the neck without stenosis. No significant atherosclerotic disease. Left carotid system: CCA and ICA  patent within the neck without stenosis. No significant atherosclerotic disease. Vertebral arteries: Streak and beam hardening artifact arising from venous reflux limits evaluation of the V1 left vertebral artery. Within this limitation, the vertebral arteries are patent within the neck without hemodynamically significant stenosis. Skeleton: Cervical spondylosis. No acute bony abnormality or aggressive osseous lesion. Other neck: No neck mass or cervical lymphadenopathy. Upper chest: No consolidation within the imaged lung apices. Review of the MIP images confirms the above findings CTA HEAD FINDINGS Anterior circulation: The intracranial  internal carotid arteries are patent. The M1 middle cerebral arteries are patent. Interval progression of a severe stenosis within a proximal left M2 MCA vessel (series 12, image 28). Multiple additional previously demonstrated severe stenoses within the bilateral M2 and M3 vessels. Hypoplastic right A1 segment. Unchanged appearance of a focal occlusion versus severe stenosis within the A2 left anterior cerebral artery (series 12, image 21). Progressive irregularity with multiple progressive severe stenoses within the left ACA vascular tree distal to this (series 12, image 21). No intracranial aneurysm is identified. Posterior circulation: The intracranial vertebral arteries are patent. The basilar artery is patent. The posterior cerebral arteries are patent. Redemonstrated mild/moderate stenosis within the proximal P2 left PCA (series 10, image 60). Posterior communicating arteries are hypoplastic or absent bilaterally. Venous sinuses: Within the limitations of contrast timing, no convincing thrombus. Anatomic variants: As described Review of the MIP images confirms the above findings CT Brain Perfusion Findings: CBF (<30%) Volume: 36m Perfusion (Tmax>6.0s) volume: 748m(including areas within the left ACA vascular territory and right MCA vascular territory). Mismatch Volume: 77  mL Infarction Location:None identified These results were called by telephone at the time of interpretation on 06/04/2020 at 2:55 pm to provider Dr. LiCheral MarkerWho verbally acknowledged these results. IMPRESSION: CTA neck: 1. The common carotid, internal carotid and vertebral arteries are patent within the neck without hemodynamically significant stenosis. 2. Aberrant right subclavian artery. CTA head: 1. Focal occlusion versus severe stenosis within the A2 left anterior cerebral artery, similar in appearance as compared to the CTA of 05/31/2020. Progressive vessel irregularity with multiple progressive severe stenoses within the left ACA vascular tree distal to this. 2. Progressive severe focal stenosis within a proximal left M2 MCA vessel. 3. Multiple additional previously demonstrated severe stenoses within the bilateral M2 and M3 MCA vessels. 4. Mild/moderate stenosis within the proximal P2 left PCA. 5. The above stenoses may be secondary to vasculitis and/or atherosclerotic disease. CT perfusion head: No core infarct is identified. Areas of hypoperfusion totaling 77 mL within the left ACA vascular territory and right MCA vascular territory (utilizing the Tmax>6 seconds threshold). Electronically Signed   By: KyKellie SimmeringO   On: 06/04/2020 15:16   MR Brain Wo Contrast (neuro protocol)  Result Date: 05/30/2020 CLINICAL DATA:  Mental status change, unknown cause. EXAM: MRI HEAD WITHOUT CONTRAST TECHNIQUE: Multiplanar, multiecho pulse sequences of the brain and surrounding structures were obtained without intravenous contrast. COMPARISON:  Noncontrast head CT performed earlier today 05/30/2020. CT angiogram head/neck 04/21/2020. Brain MRI 04/20/2020. FINDINGS: Brain: Mild cerebral and cerebellar atrophy. Redemonstrated patchy cortical/subcortical infarcts within the mid left frontal lobe/frontal operculum, now subacute. However, new from the prior brain MRI of 04/20/2020, there are new acute infarcts within the  cortical and subcortical bilateral frontal lobes, and within the left callosal body/genu (bilateral MCA and ACA territories). The largest acute infarct is present within the left callosal body/genu and measures 2 cm. Redemonstrated chronic cortical/subcortical infarct within the left parietal lobe. Redemonstrated chronic infarct within the left cerebellar hemisphere. Background moderate severe multifocal T2/FLAIR hyperintensity within the cerebral white matter, nonspecific but compatible with chronic small vessel ischemic disease. This includes chronic lacunar infarcts within the bilateral centrum semiovale and corona radiata. Redemonstrated chronic microhemorrhages within the bilateral parietal lobes. No evidence of intracranial mass. No extra-axial fluid collection. No midline shift. Vascular: Expected proximal arterial flow voids. Skull and upper cervical spine: No focal marrow lesion. Sinuses/Orbits: Visualized orbits show no acute finding. Mild mucosal thickening within the bilateral frontal sinuses. Moderate  bilateral ethmoid sinus mucosal thickening. Mild bilateral maxillary sinus mucosal thickening. IMPRESSION: Redemonstrated patchy cortical/subcortical infarcts within the mid left frontal lobe/frontal operculum, now subacute. New from the prior brain MRI of 04/20/2020, there are small acute infarcts within the cortical and subcortical frontal lobes bilaterally, and within the left callosal body/genu (bilateral MCA and ACA territories). The largest acute infarct is present within the left callosal body/genu, measuring 2 cm. 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 Stable background severe cerebral white matter chronic small vessel ischemic disease. Stable mild generalized parenchymal atrophy. Paranasal sinus disease, as described. Electronically Signed   By: Kellie Simmering DO   On: 05/30/2020 16:10   MR BRAIN W WO  CONTRAST  Result Date: 06/04/2020 CLINICAL DATA:  Stroke EXAM: MRI HEAD WITHOUT AND WITH CONTRAST TECHNIQUE: Multiplanar, multiecho pulse sequences of the brain and surrounding structures were obtained without and with intravenous contrast. CONTRAST:  30m GADAVIST GADOBUTROL 1 MMOL/ML IV SOLN COMPARISON:  CT angio head and neck 06/04/2020 FINDINGS: Brain: Bilateral recent infarcts in the anterior and middle cerebral artery territories similar to the recent MRI. Small new areas of restricted diffusion in the right frontal cortex and in the right parietal white matter and cortex compatible with interval infarct since 05/30/2020. In addition, there is a new area of restricted diffusion in the left anterior cerebellum. Following contrast infusion, there is mild enhancement of several subacute infarcts and occluding the left anterior genu of the corpus callosum, left corona radiata, and left parietal lobe compatible with subacute infarct. Ventricle size normal. 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 This shows a slight amount of enhancement however there is volume loss in the majority of this appears to be a chronic infarct. Small area of chronic hemorrhagic infarct in the right temporoparietal lobe. Moderate chronic microvascular ischemic change in the white matter. Chronic left cerebellar infarct. Negative for mass lesion. Vascular: Normal arterial flow voids. Skull and upper cervical spine: Negative Sinuses/Orbits: Extensive mucosal edema throughout the paranasal sinuses. Air-fluid level right maxillary sinus. Negative orbit Other: None IMPRESSION: Multiple areas of subacute infarct in the anterior and middle cerebral arteries bilaterally as noted on the recent MRI of 05/30/2020 Several small areas of acute infarct have developed since the prior MRI including the right frontal cortex, right parietal white matter and cortex, and the left cerebellum. Electronically Signed   By: CFranchot GalloM.D.    On: 06/04/2020 17:55   CT CEREBRAL PERFUSION W CONTRAST  Result Date: 06/04/2020 CLINICAL DATA:  Neuro deficit, acute, stroke suspected; recent LVO. Additional provided: Right leg weakness. Fall today. EXAM: CT ANGIOGRAPHY HEAD AND NECK CT PERFUSION BRAIN TECHNIQUE: Multidetector CT imaging of the head and neck was performed using the standard protocol during bolus administration of intravenous contrast. Multiplanar CT image reconstructions and MIPs were obtained to evaluate the vascular anatomy. Carotid stenosis measurements (when applicable) are obtained utilizing NASCET criteria, using the distal internal carotid diameter as the denominator. Multiphase CT imaging of the brain was performed following IV bolus contrast injection. Subsequent parametric perfusion maps were calculated using RAPID software. CONTRAST:  1034mOMNIPAQUE IOHEXOL 350 MG/ML SOLN COMPARISON:  Prior CT angiogram head/neck 05/31/2020. FINDINGS: CTA NECK FINDINGS Aortic arch: Aberrant right subclavian artery. The visualized aortic arch is normal in caliber. No hemodynamically significant innominate or proximal subclavian artery stenosis. Right carotid system: CCA and ICA patent within the neck without stenosis. No significant atherosclerotic disease. Left carotid system:  CCA and ICA patent within the neck without stenosis. No significant atherosclerotic disease. Vertebral arteries: Streak and beam hardening artifact arising from venous reflux limits evaluation of the V1 left vertebral artery. Within this limitation, the vertebral arteries are patent within the neck without hemodynamically significant stenosis. Skeleton: Cervical spondylosis. No acute bony abnormality or aggressive osseous lesion. Other neck: No neck mass or cervical lymphadenopathy. Upper chest: No consolidation within the imaged lung apices. Review of the MIP images confirms the above findings CTA HEAD FINDINGS Anterior circulation: The intracranial internal carotid  arteries are patent. The M1 middle cerebral arteries are patent. Interval progression of a severe stenosis within a proximal left M2 MCA vessel (series 12, image 28). Multiple additional previously demonstrated severe stenoses within the bilateral M2 and M3 vessels. Hypoplastic right A1 segment. Unchanged appearance of a focal occlusion versus severe stenosis within the A2 left anterior cerebral artery (series 12, image 21). Progressive irregularity with multiple progressive severe stenoses within the left ACA vascular tree distal to this (series 12, image 21). No intracranial aneurysm is identified. Posterior circulation: The intracranial vertebral arteries are patent. The basilar artery is patent. The posterior cerebral arteries are patent. Redemonstrated mild/moderate stenosis within the proximal P2 left PCA (series 10, image 60). Posterior communicating arteries are hypoplastic or absent bilaterally. Venous sinuses: Within the limitations of contrast timing, no convincing thrombus. Anatomic variants: As described Review of the MIP images confirms the above findings CT Brain Perfusion Findings: CBF (<30%) Volume: 47m Perfusion (Tmax>6.0s) volume: 758m(including areas within the left ACA vascular territory and right MCA vascular territory). Mismatch Volume: 77 mL Infarction Location:None identified These results were called by telephone at the time of interpretation on 06/04/2020 at 2:55 pm to provider Dr. LiCheral MarkerWho verbally acknowledged these results. IMPRESSION: CTA neck: 1. The common carotid, internal carotid and vertebral arteries are patent within the neck without hemodynamically significant stenosis. 2. Aberrant right subclavian artery. CTA head: 1. Focal occlusion versus severe stenosis within the A2 left anterior cerebral artery, similar in appearance as compared to the CTA of 05/31/2020. Progressive vessel irregularity with multiple progressive severe stenoses within the left ACA vascular tree distal  to this. 2. Progressive severe focal stenosis within a proximal left M2 MCA vessel. 3. Multiple additional previously demonstrated severe stenoses within the bilateral M2 and M3 MCA vessels. 4. Mild/moderate stenosis within the proximal P2 left PCA. 5. The above stenoses may be secondary to vasculitis and/or atherosclerotic disease. CT perfusion head: No core infarct is identified. Areas of hypoperfusion totaling 77 mL within the left ACA vascular territory and right MCA vascular territory (utilizing the Tmax>6 seconds threshold). Electronically Signed   By: KyKellie SimmeringO   On: 06/04/2020 15:16   ECHO TEE  Result Date: 06/01/2020    TRANSESOPHOGEAL ECHO REPORT   Patient Name:   Nathaniel DOLECKIate of Exam: 06/01/2020 Medical Rec #:  [id number]    Height:       73.0 in Accession #:    [id number]   Weight:       230.0 lb Date of Birth:  [birth year]     BSA:          2.283 m Patient Age:    [age]       BP:           185/111 mmHg Patient Gender: M              HR:  101 bpm. Exam Location:  Inpatient Procedure: Transesophageal Echo, Color Doppler, Cardiac Doppler and Saline            Contrast Bubble Study Indications:     Stroke  History:         Patient has prior history of Echocardiogram examinations, most                  recent 04/21/2020. Risk Factors:Hypertension.  Sonographer:     Bernadene Person RDCS Referring Phys:  [id number] Leanor Kail Diagnosing Phys: Oswaldo Milian MD PROCEDURE: After discussion of the risks and benefits of a TEE, an informed consent was obtained from the patient. The transesophogeal probe was passed without difficulty through the esophogus of the patient. Local oropharyngeal anesthetic was provided with Cetacaine. Sedation performed by different physician. The patient was monitored while under deep sedation. Anesthestetic sedation was provided intravenously by Anesthesiology: 477.47m of Propofol. The patient developed Oropharyngeal bleeding during  the  procedure but no further bleeding was noted during recovery. IMPRESSIONS  1. Left ventricular ejection fraction, by estimation, is 60 to 65%. The left ventricle has normal function. The left ventricle has no regional wall motion abnormalities. There is severe left ventricular hypertrophy.  2. Right ventricular systolic function is normal. The right ventricular size is normal.  3. No left atrial/left atrial appendage thrombus was detected.  4. The mitral valve is normal in structure. Trivial mitral valve regurgitation.  5. The aortic valve is tricuspid. Aortic valve regurgitation is trivial.  6. Agitated saline contrast bubble study was negative, with no evidence of any interatrial shunt. Conclusion(s)/Recommendation(s): No LA/LAA thrombus identified. Negative bubble study for interatrial shunt. No intracardiac source of embolism detected on this on this transesophageal echocardiogram. FINDINGS  Left Ventricle: Left ventricular ejection fraction, by estimation, is 60 to 65%. The left ventricle has normal function. The left ventricle has no regional wall motion abnormalities. The left ventricular internal cavity size was normal in size. There is  severe left ventricular hypertrophy. Right Ventricle: The right ventricular size is normal. No increase in right ventricular wall thickness. Right ventricular systolic function is normal. Left Atrium: Left atrial size was normal in size. No left atrial/left atrial appendage thrombus was detected. Right Atrium: Right atrial size was normal in size. Pericardium: There is no evidence of pericardial effusion. Mitral Valve: The mitral valve is normal in structure. Trivial mitral valve regurgitation. Tricuspid Valve: The tricuspid valve is normal in structure. Tricuspid valve regurgitation is trivial. Aortic Valve: The aortic valve is tricuspid. Aortic valve regurgitation is trivial. Pulmonic Valve: The pulmonic valve was grossly normal. Pulmonic valve regurgitation is not  visualized. Aorta: The aortic root is normal in size and structure. IAS/Shunts: No atrial level shunt detected by color flow Doppler. Agitated saline contrast was given intravenously to evaluate for intracardiac shunting. Agitated saline contrast bubble study was negative, with no evidence of any interatrial shunt. COswaldo MilianMD Electronically signed by COswaldo MilianMD Signature Date/Time: 06/01/2020/6:21:55 PM    Final    CT HEAD CODE STROKE WO CONTRAST  Result Date: 06/04/2020 CLINICAL DATA:  Code stroke. Neuro deficit, acute, stroke suspected. Additional history provided: Last known normal 1:15 p.m., weakness, fall in part today, 3 recent strokes. EXAM: CT HEAD WITHOUT CONTRAST TECHNIQUE: Contiguous axial images were obtained from the base of the skull through the vertex without intravenous contrast. COMPARISON:  CT angiogram head/neck 05/31/2020. Brain MRI 05/30/2020. FINDINGS: Brain: Mild cerebral and cerebellar atrophy. No interval acute demarcated cortical infarct is identified. No  evidence of acute intracranial hemorrhage. Redemonstrated subacute infarction changes within the anterior left frontal lobe white matter and left callosal body/genu. Additional known subacute infarcts within the bilateral cerebral hemispheres were better appreciated on the brain MRI of 05/30/2020. Redemonstrated chronic cortical/subcortical infarct within the left parietal lobe. Stable background advanced chronic small vessel ischemic disease within the cerebral white matter, including multiple chronic small-vessel infarcts within the bilateral cerebral white matter. Redemonstrated chronic infarct within the left cerebellum. No extra-axial fluid collection. No evidence of intracranial mass. No midline shift. Vascular: No hyperdense vessel. Skull: Normal. Negative for fracture or focal lesion. Sinuses/Orbits: Visualized orbits show no acute finding. Pansinusitis. Most notably, there is severe mucosal thickening  within the left frontal sinus, within the bilateral ethmoid air cells and within the bilateral maxillary sinuses. Superimposed scattered small volume frothy secretions. ASPECTS [facility] Stroke Program Early CT Score) - Ganglionic level infarction (caudate, lentiform nuclei, internal capsule, insula, M1-M3 cortex): 7 - Supraganglionic infarction (M4-M6 cortex): 3 Total score (0-10 with 10 being normal): 10 (when discounting subacute and chronic infarcts). These results were called by telephone at the time of interpretation on 06/04/2020 at 2:08 pm to provider DAVID YAO , who verbally acknowledged these results. IMPRESSION: Comparison is made to the prior CT head of 05/31/2020. No appreciable interval acute infarct. Redemonstrated subacute infarcts within the anterior left frontal lobe white matter and left callosal body/genu. Additional known subacute infarcts within the bilateral cerebral hemispheres were better appreciated on the brain MRI of 05/30/2020. Redemonstrated chronic cortical/subcortical infarct within the left parietal lobe. Stable background advanced cerebral white matter chronic small vessel ischemic disease. Redemonstrated chronic left cerebellar infarct. Stable mild generalized parenchymal atrophy. Pansinusitis. Electronically Signed   By: Kellie Simmering DO   On: 06/04/2020 14:09       Subjective: Patient seen and examined at bedside.  Poor historian.  Does not participate in conversation much.  No overnight fever, vomiting, worsening weakness reported.  Discharge Exam: Vitals:   06/05/20 2109 06/06/20 0443  BP: (!) 155/99 (!) 145/113  Pulse: 81 89  Resp: 16 20  Temp: 98.4 F (36.9 C) 98.3 F (36.8 C)  SpO2: 96% 97%    General exam: No distress.  Poor historian.  On room air currently. Respiratory system: Decreased breath sounds at bases bilaterally, no wheezing cardiovascular system: Rate controlled, S1-S2 heard  gastrointestinal system: Abdomen is slightly distended, soft and  nontender.  Bowel sounds are heard  extremities: No clubbing or cyanosis  Central nervous system: Awake, very slow to respond.  Follows some commands.  No focal neurological deficits.  Moves extremities. Skin: Obvious ecchymosis/rashes Psychiatry: Affect is flat.  Hardly participates in any conversation.    The results of significant diagnostics from this hospitalization (including imaging, microbiology, ancillary and laboratory) are listed below for reference.     Microbiology: Recent Results (from the past 240 hour(s))  Resp Panel by RT-PCR (Flu A&B, Covid) Nasopharyngeal Swab     Status: None   Collection Time: 05/30/20  1:55 PM   Specimen: Nasopharyngeal Swab; Nasopharyngeal(NP) swabs in vial transport medium  Result Value Ref Range Status   SARS Coronavirus 2 by RT PCR NEGATIVE NEGATIVE Final    Comment: (NOTE) SARS-CoV-2 target nucleic acids are NOT DETECTED.  The SARS-CoV-2 RNA is generally detectable in upper respiratory specimens during the acute phase of infection. The lowest concentration of SARS-CoV-2 viral copies this assay can detect is 138 copies/mL. A negative result does not preclude SARS-Cov-2 infection and should not be used  as the sole basis for treatment or other patient management decisions. A negative result may occur with  improper specimen collection/handling, submission of specimen other than nasopharyngeal swab, presence of viral mutation(s) within the areas targeted by this assay, and inadequate number of viral copies(<138 copies/mL). A negative result must be combined with clinical observations, patient history, and epidemiological information. The expected result is Negative.  Fact Sheet for Patients:  EntrepreneurPulse.com.au  Fact Sheet for Healthcare Providers:  IncredibleEmployment.be  This test is no t yet approved or cleared by the [place] FDA and  has been authorized for detection and/or diagnosis  of SARS-CoV-2 by FDA under an Emergency Use Authorization (EUA). This EUA will remain  in effect (meaning this test can be used) for the duration of the COVID-19 declaration under Section 564(b)(1) of the Act, 21 U.S.C.section 360bbb-3(b)(1), unless the authorization is terminated  or revoked sooner.       Influenza A by PCR NEGATIVE NEGATIVE Final   Influenza B by PCR NEGATIVE NEGATIVE Final    Comment: (NOTE) The Xpert Xpress SARS-CoV-2/FLU/RSV plus assay is intended as an aid in the diagnosis of influenza from Nasopharyngeal swab specimens and should not be used as a sole basis for treatment. Nasal washings and aspirates are unacceptable for Xpert Xpress SARS-CoV-2/FLU/RSV testing.  Fact Sheet for Patients: EntrepreneurPulse.com.au  Fact Sheet for Healthcare Providers: IncredibleEmployment.be  This test is not yet approved or cleared by the [place] FDA and has been authorized for detection and/or diagnosis of SARS-CoV-2 by FDA under an Emergency Use Authorization (EUA). This EUA will remain in effect (meaning this test can be used) for the duration of the COVID-19 declaration under Section 564(b)(1) of the Act, 21 U.S.C. section 360bbb-3(b)(1), unless the authorization is terminated or revoked.  Performed at [facility], [place] [address], [place], [place] [address]   Resp Panel by RT-PCR (Flu A&B, Covid) Nasopharyngeal Swab     Status: None   Collection Time: 06/04/20  1:43 PM   Specimen: Nasopharyngeal Swab; Nasopharyngeal(NP) swabs in vial transport medium  Result Value Ref Range Status   SARS Coronavirus 2 by RT PCR NEGATIVE NEGATIVE Final    Comment: (NOTE) SARS-CoV-2 target nucleic acids are NOT DETECTED.  The SARS-CoV-2 RNA is generally detectable in upper respiratory specimens during the acute phase of infection. The lowest concentration of SARS-CoV-2 viral copies this assay can detect is 138 copies/mL.  A negative result does not preclude SARS-Cov-2 infection and should not be used as the sole basis for treatment or other patient management decisions. A negative result may occur with  improper specimen collection/handling, submission of specimen other than nasopharyngeal swab, presence of viral mutation(s) within the areas targeted by this assay, and inadequate number of viral copies(<138 copies/mL). A negative result must be combined with clinical observations, patient history, and epidemiological information. The expected result is Negative.  Fact Sheet for Patients:  EntrepreneurPulse.com.au  Fact Sheet for Healthcare Providers:  IncredibleEmployment.be  This test is no t yet approved or cleared by the [place] FDA and  has been authorized for detection and/or diagnosis of SARS-CoV-2 by FDA under an Emergency Use Authorization (EUA). This EUA will remain  in effect (meaning this test can be used) for the duration of the COVID-19 declaration under Section 564(b)(1) of the Act, 21 U.S.C.section 360bbb-3(b)(1), unless the authorization is terminated  or revoked sooner.       Influenza A by PCR NEGATIVE NEGATIVE Final   Influenza B by PCR NEGATIVE NEGATIVE  Final    Comment: (NOTE) The Xpert Xpress SARS-CoV-2/FLU/RSV plus assay is intended as an aid in the diagnosis of influenza from Nasopharyngeal swab specimens and should not be used as a sole basis for treatment. Nasal washings and aspirates are unacceptable for Xpert Xpress SARS-CoV-2/FLU/RSV testing.  Fact Sheet for Patients: EntrepreneurPulse.com.au  Fact Sheet for Healthcare Providers: IncredibleEmployment.be  This test is not yet approved or cleared by the [place] FDA and has been authorized for detection and/or diagnosis of SARS-CoV-2 by FDA under an Emergency Use Authorization (EUA). This EUA will remain in effect (meaning this test  can be used) for the duration of the COVID-19 declaration under Section 564(b)(1) of the Act, 21 U.S.C. section 360bbb-3(b)(1), unless the authorization is terminated or revoked.  Performed at [facility], [place] [address], [place], [place] [address]   CSF culture     Status: None (Preliminary result)   Collection Time: 06/04/20  3:05 PM   Specimen: CSF; Cerebrospinal Fluid  Result Value Ref Range Status   Specimen Description   Final    CSF Performed at [place] [address], [place], [place] [address]    Special Requests   Final    NONE Performed at [facility], [place] [address], [place], [place] [address]    Gram Stain   Final    WBC PRESENT, PREDOMINANTLY MONONUCLEAR NO ORGANISMS SEEN CYTOSPIN SMEAR    Culture   Final    NO GROWTH 2 DAYS Performed at [place] [address], [place], [place] [address]    Report Status PENDING  Incomplete     Labs: BNP (last 3 results) No results for input(s): BNP in the last 8760 hours. Basic Metabolic Panel: Recent Labs  Lab 06/01/20 0059 06/02/20 0038 06/03/20 0129 06/03/20 1540 06/04/20 1352 06/04/20 1502 06/05/20 0657 06/06/20 0555  NA 140 139 138 140 141 143 136 136  K 3.3* 3.5 3.8 3.9 3.7 3.7 3.9 3.8  CL 108 105 106 108 104 102 103 104  CO2 _0 --  23 26  GLUCOSE 117* 99 98 104* 152* 141* 226* 240*  BUN _1 29*  CREATININE 1.12 1.24 1.22 1.47* 1.36* 1.40* 1.23 1.27*  CALCIUM 8.5* 8.7* 8.7* 8.8* 9.2  --  9.2 9.0  MG 1.8 1.6* 1.9  --   --   --  1.9 2.1   Liver Function Tests: Recent Labs  Lab 05/30/20 1326 06/03/20 1540 06/04/20 1352 06/05/20 0657  AST 14* 13* 15 12*  ALT _2 ALKPHOS 69 62 67 71  BILITOT 0.4 0.7 0.6 0.6  PROT 7.3 7.2 8.4* 8.1  ALBUMIN 3.7 3.2* 3.9 3.6   No results for input(s): LIPASE, AMYLASE in the last 168 hours. Recent Labs  Lab 05/30/20 1328  AMMONIA 38*   CBC: Recent  Labs  Lab 06/02/20 0038 06/03/20 0129 06/03/20 1540 06/04/20 1352 06/04/20 1502 06/05/20 0657 06/06/20 0555  WBC 7.4 6.8 7.5 7.4  --  7.3 13.3*  NEUTROABS 4.3 4.0  --  4.7  --  6.3 11.7*  HGB 12.2* 11.9* 12.3* 12.3* 18.4* 12.1* 11.3*  HCT 37.6* 36.5* 38.6* 38.5* 54.0* 37.8* 34.3*  MCV 79.0* 78.5* 81.1 80.2  --  79.7* 78.5*  PLT 222 232 239 282  --  286 305   Cardiac Enzymes: No results for input(s): CKTOTAL, CKMB, CKMBINDEX, TROPONINI in the last 168 hours.  BNP: Invalid input(s): POCBNP CBG: Recent Labs  Lab 06/05/20 0814 06/05/20 1200 06/05/20 1702 06/05/20 2037 06/06/20 0736  GLUCAP 227* 207* 211* 295* 223*   D-Dimer No results for input(s): DDIMER in the last 72 hours. Hgb A1c No results for input(s): HGBA1C in the last 72 hours. Lipid Profile No results for input(s): CHOL, HDL, LDLCALC, TRIG, CHOLHDL, LDLDIRECT in the last 72 hours. Thyroid function studies No results for input(s): TSH, T4TOTAL, T3FREE, THYROIDAB in the last 72 hours.  Invalid input(s): FREET3 Anemia work up No results for input(s): VITAMINB12, FOLATE, FERRITIN, TIBC, IRON, RETICCTPCT in the last 72 hours. Urinalysis    Component Value Date/Time   COLORURINE YELLOW 06/05/2020 2220   APPEARANCEUR HAZY (A) 06/05/2020 2220   LABSPEC 1.028 06/05/2020 2220   PHURINE 5.0 06/05/2020 2220   GLUCOSEU 150 (A) 06/05/2020 2220   HGBUR NEGATIVE 06/05/2020 2220   BILIRUBINUR NEGATIVE 06/05/2020 2220   KETONESUR NEGATIVE 06/05/2020 2220   PROTEINUR NEGATIVE 06/05/2020 2220   NITRITE NEGATIVE 06/05/2020 2220   LEUKOCYTESUR NEGATIVE 06/05/2020 2220   [facility] Invalid input(s): PROCALCITONIN,  WBC,  LACTICIDVEN Microbiology Recent Results (from the past 240 hour(s))  Resp Panel by RT-PCR (Flu A&B, Covid) Nasopharyngeal Swab     Status: None   Collection Time: 05/30/20  1:55 PM   Specimen: Nasopharyngeal Swab; Nasopharyngeal(NP) swabs in vial transport medium  Result Value Ref Range Status   SARS  Coronavirus 2 by RT PCR NEGATIVE NEGATIVE Final    Comment: (NOTE) SARS-CoV-2 target nucleic acids are NOT DETECTED.  The SARS-CoV-2 RNA is generally detectable in upper respiratory specimens during the acute phase of infection. The lowest concentration of SARS-CoV-2 viral copies this assay can detect is 138 copies/mL. A negative result does not preclude SARS-Cov-2 infection and should not be used as the sole basis for treatment or other patient management decisions. A negative result may occur with  improper specimen collection/handling, submission of specimen other than nasopharyngeal swab, presence of viral mutation(s) within the areas targeted by this assay, and inadequate number of viral copies(<138 copies/mL). A negative result must be combined with clinical observations, patient history, and epidemiological information. The expected result is Negative.  Fact Sheet for Patients:  EntrepreneurPulse.com.au  Fact Sheet for Healthcare Providers:  IncredibleEmployment.be  This test is no t yet approved or cleared by the [place] FDA and  has been authorized for detection and/or diagnosis of SARS-CoV-2 by FDA under an Emergency Use Authorization (EUA). This EUA will remain  in effect (meaning this test can be used) for the duration of the COVID-19 declaration under Section 564(b)(1) of the Act, 21 U.S.C.section 360bbb-3(b)(1), unless the authorization is terminated  or revoked sooner.       Influenza A by PCR NEGATIVE NEGATIVE Final   Influenza B by PCR NEGATIVE NEGATIVE Final    Comment: (NOTE) The Xpert Xpress SARS-CoV-2/FLU/RSV plus assay is intended as an aid in the diagnosis of influenza from Nasopharyngeal swab specimens and should not be used as a sole basis for treatment. Nasal washings and aspirates are unacceptable for Xpert Xpress SARS-CoV-2/FLU/RSV testing.  Fact Sheet for  Patients: EntrepreneurPulse.com.au  Fact Sheet for Healthcare Providers: IncredibleEmployment.be  This test is not yet approved or cleared by the [place] FDA and has been authorized for detection and/or diagnosis of SARS-CoV-2 by FDA under an Emergency Use Authorization (EUA). This EUA will remain in effect (meaning this test can be used) for the duration of the COVID-19 declaration under Section 564(b)(1) of the  Act, 21 U.S.C. section 360bbb-3(b)(1), unless the authorization is terminated or revoked.  Performed at [facility], [place] [address] [place], [place] [address]   Resp Panel by RT-PCR (Flu A&B, Covid) Nasopharyngeal Swab     Status: None   Collection Time: 06/04/20  1:43 PM   Specimen: Nasopharyngeal Swab; Nasopharyngeal(NP) swabs in vial transport medium  Result Value Ref Range Status   SARS Coronavirus 2 by RT PCR NEGATIVE NEGATIVE Final    Comment: (NOTE) SARS-CoV-2 target nucleic acids are NOT DETECTED.  The SARS-CoV-2 RNA is generally detectable in upper respiratory specimens during the acute phase of infection. The lowest concentration of SARS-CoV-2 viral copies this assay can detect is 138 copies/mL. A negative result does not preclude SARS-Cov-2 infection and should not be used as the sole basis for treatment or other patient management decisions. A negative result may occur with  improper specimen collection/handling, submission of specimen other than nasopharyngeal swab, presence of viral mutation(s) within the areas targeted by this assay, and inadequate number of viral copies(<138 copies/mL). A negative result must be combined with clinical observations, patient history, and epidemiological information. The expected result is Negative.  Fact Sheet for Patients:  EntrepreneurPulse.com.au  Fact Sheet for Healthcare Providers:  IncredibleEmployment.be  This test  is no t yet approved or cleared by the [place] FDA and  has been authorized for detection and/or diagnosis of SARS-CoV-2 by FDA under an Emergency Use Authorization (EUA). This EUA will remain  in effect (meaning this test can be used) for the duration of the COVID-19 declaration under Section 564(b)(1) of the Act, 21 U.S.C.section 360bbb-3(b)(1), unless the authorization is terminated  or revoked sooner.       Influenza A by PCR NEGATIVE NEGATIVE Final   Influenza B by PCR NEGATIVE NEGATIVE Final    Comment: (NOTE) The Xpert Xpress SARS-CoV-2/FLU/RSV plus assay is intended as an aid in the diagnosis of influenza from Nasopharyngeal swab specimens and should not be used as a sole basis for treatment. Nasal washings and aspirates are unacceptable for Xpert Xpress SARS-CoV-2/FLU/RSV testing.  Fact Sheet for Patients: EntrepreneurPulse.com.au  Fact Sheet for Healthcare Providers: IncredibleEmployment.be  This test is not yet approved or cleared by the [place] FDA and has been authorized for detection and/or diagnosis of SARS-CoV-2 by FDA under an Emergency Use Authorization (EUA). This EUA will remain in effect (meaning this test can be used) for the duration of the COVID-19 declaration under Section 564(b)(1) of the Act, 21 U.S.C. section 360bbb-3(b)(1), unless the authorization is terminated or revoked.  Performed at [facility], [place] [address] [place], [place] [address]   CSF culture     Status: None (Preliminary result)   Collection Time: 06/04/20  3:05 PM   Specimen: CSF; Cerebrospinal Fluid  Result Value Ref Range Status   Specimen Description   Final    CSF Performed at [facility] [address] [place], [place] [address]    Special Requests   Final    NONE Performed at [facility], [place] [address] [place], [place] [address]    Gram Stain   Final    WBC  PRESENT, PREDOMINANTLY MONONUCLEAR NO ORGANISMS SEEN CYTOSPIN SMEAR    Culture   Final    NO GROWTH 2 DAYS Performed at [facility] [address], [place], [place] [address]    Report Status PENDING  Incomplete     Time coordinating discharge: 35 minutes  SIGNED:  Aline August, MD  [facility] 06/06/2020, 11:52 AM

## 2020-06-06 NOTE — Progress Notes (Signed)
Patient ID: Nathaniel Spears, male   DOB: [birth year], 45 y.o.   MRN: [id number]  PROGRESS NOTE    Nathaniel Spears  EVO:[id number] DOB: 1975-04-16 DOA: 06/04/2020 PCP: Grayce Sessions, NP   Brief Narrative:  45 year old male with history of diabetes mellitus type 2, hyperlipidemia, hypertension, cocaine abuse, obesity, nicotine dependence and multiple recent strokes requiring hospitalization presented on 06/04/2020 with right lower extremity weakness and fall, 24 hours after discharge from [facility] for recently diagnosed stroke.  Of note, patient was hospitalized at [facility] from 4/5 until 04/23/2020 after presenting with aphasia.  Patient was identified to be suffering from an acute stroke of the left middle frontal lobe.  Patient was initiated on dual antiplatelet therapy and eventually discharged on 4/8.  Patient then presented again to [facility] on 5/15 with symptoms of left lower extremity weakness, dizziness falling and recurrent aphasia.  On this presentation a repeat MRI was obtained demonstrating ischemia in the ACA distribution as well as his previous MCA stroke.  Patient was eventually discharged from [facility] on 5/19.  Shortly after discharge on 5/19 patient presented via EMS back to [facility] emergency department after the family noticed patient was having difficulty with speech and acting strangely.  On that evaluation, case was reviewed with neurology and no further imaging was recommended.  Initiation of Keppra for seizure prophylaxis was recommended however and patient was discharged home.  On 5/20, patient was with family at home in the early afternoon when he suddenly fell while walking.  This was a witnessed fall by the patient's girlfriend.  Patient once again began to exhibit difficulty with speech.  Patient was then brought into [facility] emergency department.  On presentation, patient was evaluated by teleneurology. A stat CT  angiogram of the head and neck was obtained as well as cerebral perfusion imaging and noncontrast CT imaging of the head.  Imaging revealed that many of the areas of cerebrovascular disease were unchanged however it also identified several areas of progressive disease.  Neurology recommended hospitalization for repeat MRI and additional work-up.  They also recommended a vasculitis panel and lumbar puncture.  A lumbar puncture was performed in the emergency department as recommended.  Patient was to be admitted to [facility] however due to the patient and family's dissatisfaction with [facility] they instead requested that the patient be transferred to [facility] for further care.  After the ER provider attempted this and it was unsuccessful, family then agreed for patient to be admitted to [facility] for further management instead of going to [facility].  This was approved by Dr. Otelia Limes with neurology.    Assessment & Plan:   Acute recurrent ischemic strokes -Presented with intermittent expressive and receptive aphasia with associated left-sided weakness and fall. -Apparently he is compliant with his home aspirin, Plavix and statin therapy and now abstinent of cocaine use and has quit smoking. -Noncontrast CT head with CTA and perfusion imaging here in the emergency department concerning for progressive cerebrovascular process with possible acute or subacute strokes -overall processes concerning for possible underlying vasculitis/autoimmune disease or hypercoagulable state. -MRI of brain shows multiple areas of subacute infarct as noted in the prior MRI of 05/30/2020 along with several small areas of acute infarct have developed since the prior MRI.  Patient initially refused admission to [facility] as recommended by teleneurology and wanted to be transferred to [facility] which was attempted by ER provider without success.  Eventually patient  was admitted to [facility]. -Neurology team will  see the patient once patient gets transferred to [facility].  Family still does not want the patient to be transferred to [facility] and still requesting that patient be transferred to [facility]. -I called [facility] hospital again this morning and spoke to transfer center representative who said that they are not accepting any outside transfers at this moment because of lack of bed availability.  Spoke to [facility] on phone again this morning and updated the same and she told me that she is trying to get in contact with the CA of [facility] hospital who will contact the VP of the hospital to see if the transfer can be expedited.  I again requested that till we are able to transfer the patient to [facility], patient be transferred to [facility] at least for neurology evaluation.  She wants to wait to hear from the CA of [facility] hospital before making the decision. -Status post LP in the ED as per tele neurology recommendations and currently on high-dose Solu-Medrol as well -Continue aspirin, statin, Plavix and Keppra.  PT/OT/SLP evaluation  Essential hypertension -Allow for permissive hypertension. -Blood pressure currently intermittently elevated.  Diabetes mellitus type 2 with hyperglycemia -Increase Lantus to 15 units daily and continue CBGs with SSI.  A1c was 10.3 on 05/31/2020  Nicotine dependence -Apparently recently quit smoking  History of cocaine abuse -Recently quit reportedly.  DVT prophylaxis: SCDs Code Status: Full Family Communication: Significant other/[name] at bedside.  Spoke to [employer] on phone on 06/06/2020  disposition Plan: Status is: Inpatient  Remains inpatient appropriate because:Inpatient level of care appropriate due to severity of illness   Dispo:  Patient From: Home  Planned Disposition: Home.  Waiting to be transferred to [facility] versus [facility] if family agrees  Medically stable for discharge: No      Consultants: Neurology  Procedures: None  Antimicrobials: None   Subjective: Patient seen and examined at bedside.  Poor historian.  Does not participate in conversation much.  No overnight fever, vomiting, worsening weakness reported. Objective: Vitals:   06/05/20 1033 06/05/20 1317 06/05/20 2109 06/06/20 0443  BP: (!) 152/99 (!) 144/98 (!) 155/99 (!) 145/113  Pulse: 81 76 81 89  Resp: 19 19 16 20   Temp: 98.5 F (36.9 C) 98.6 F (37 C) 98.4 F (36.9 C) 98.3 F (36.8 C)  TempSrc:   Oral Oral  SpO2: 99% 100% 96% 97%  Weight:      Height:        Intake/Output Summary (Last 24 hours) at 06/06/2020 1025 Last data filed at 06/05/2020 1300 Gross per 24 hour  Intake 480 ml  Output --  Net 480 ml   Filed Weights   06/04/20 1354 06/04/20 2226  Weight: 105 kg 110.3 kg    Examination:  General exam: No distress.  Poor historian.  On room air currently. Respiratory system: Decreased breath sounds at bases bilaterally, no wheezing cardiovascular system: Rate controlled, S1-S2 heard  gastrointestinal system: Abdomen is slightly distended, soft and nontender.  Bowel sounds are heard  extremities: No clubbing or cyanosis  Central nervous system: Awake, very slow to respond.  Follows some commands.  No focal neurological deficits.  Moves extremities. Skin: Obvious ecchymosis/rashes Psychiatry: Affect is flat.  Hardly participates in any conversation.   Data Reviewed: I have personally reviewed following labs and imaging studies  CBC: Recent Labs  Lab 06/02/20 0038 06/03/20 0129 06/03/20 1540 06/04/20 1352  06/04/20 1502 06/05/20 0657 06/06/20 0555  WBC 7.4 6.8 7.5 7.4  --  7.3 13.3*  NEUTROABS 4.3 4.0  --  4.7  --  6.3 11.7*  HGB 12.2* 11.9* 12.3* 12.3* 18.4* 12.1* 11.3*  HCT 37.6* 36.5* 38.6* 38.5* 54.0* 37.8* 34.3*  MCV 79.0* 78.5* 81.1 80.2  --  79.7* 78.5*  PLT 222 232 239 282  --  286 305   Basic Metabolic Panel: Recent Labs  Lab 06/01/20 0059  06/02/20 0038 06/03/20 0129 06/03/20 1540 06/04/20 1352 06/04/20 1502 06/05/20 0657 06/06/20 0555  NA 140 139 138 140 141 143 136 136  K 3.3* 3.5 3.8 3.9 3.7 3.7 3.9 3.8  CL 108 105 106 108 104 102 103 104  CO2 25 27 23 24 28   --  23 26  GLUCOSE 117* 99 98 104* 152* 141* 226* 240*  BUN 10 7 9 16 20 18 19  29*  CREATININE 1.12 1.24 1.22 1.47* 1.36* 1.40* 1.23 1.27*  CALCIUM 8.5* 8.7* 8.7* 8.8* 9.2  --  9.2 9.0  MG 1.8 1.6* 1.9  --   --   --  1.9 2.1   GFR: Estimated Creatinine Clearance: 95.7 mL/min (A) (by C-G formula based on SCr of 1.27 mg/dL (H)). Liver Function Tests: Recent Labs  Lab 05/30/20 1326 06/03/20 1540 06/04/20 1352 06/05/20 0657  AST 14* 13* 15 12*  ALT 14 14 14 15   ALKPHOS 69 62 67 71  BILITOT 0.4 0.7 0.6 0.6  PROT 7.3 7.2 8.4* 8.1  ALBUMIN 3.7 3.2* 3.9 3.6   No results for input(s): LIPASE, AMYLASE in the last 168 hours. Recent Labs  Lab 05/30/20 1328  AMMONIA 38*   Coagulation Profile: Recent Labs  Lab 05/30/20 1326 06/04/20 1352  INR 1.1 1.0   Cardiac Enzymes: No results for input(s): CKTOTAL, CKMB, CKMBINDEX, TROPONINI in the last 168 hours. BNP (last 3 results) No results for input(s): PROBNP in the last 8760 hours. HbA1C: No results for input(s): HGBA1C in the last 72 hours. CBG: Recent Labs  Lab 06/05/20 0814 06/05/20 1200 06/05/20 1702 06/05/20 2037 06/06/20 0736  GLUCAP 227* 207* 211* 295* 223*   Lipid Profile: No results for input(s): CHOL, HDL, LDLCALC, TRIG, CHOLHDL, LDLDIRECT in the last 72 hours. Thyroid Function Tests: No results for input(s): TSH, T4TOTAL, FREET4, T3FREE, THYROIDAB in the last 72 hours. Anemia Panel: No results for input(s): VITAMINB12, FOLATE, FERRITIN, TIBC, IRON, RETICCTPCT in the last 72 hours. Sepsis Labs: No results for input(s): PROCALCITON, LATICACIDVEN in the last 168 hours.  Recent Results (from the past 240 hour(s))  Resp Panel by RT-PCR (Flu A&B, Covid) Nasopharyngeal Swab     Status:  None   Collection Time: 05/30/20  1:55 PM   Specimen: Nasopharyngeal Swab; Nasopharyngeal(NP) swabs in vial transport medium  Result Value Ref Range Status   SARS Coronavirus 2 by RT PCR NEGATIVE NEGATIVE Final    Comment: (NOTE) SARS-CoV-2 target nucleic acids are NOT DETECTED.  The SARS-CoV-2 RNA is generally detectable in upper respiratory specimens during the acute phase of infection. The lowest concentration of SARS-CoV-2 viral copies this assay can detect is 138 copies/mL. A negative result does not preclude SARS-Cov-2 infection and should not be used as the sole basis for treatment or other patient management decisions. A negative result may occur with  improper specimen collection/handling, submission of specimen other than nasopharyngeal swab, presence of viral mutation(s) within the areas targeted by this assay, and inadequate number of viral copies(<138 copies/mL). A negative result  must be combined with clinical observations, patient history, and epidemiological information. The expected result is Negative.  Fact Sheet for Patients:  BloggerCourse.com  Fact Sheet for Healthcare Providers:  SeriousBroker.it  This test is no t yet approved or cleared by the [place] FDA and  has been authorized for detection and/or diagnosis of SARS-CoV-2 by FDA under an Emergency Use Authorization (EUA). This EUA will remain  in effect (meaning this test can be used) for the duration of the COVID-19 declaration under Section 564(b)(1) of the Act, 21 U.S.C.section 360bbb-3(b)(1), unless the authorization is terminated  or revoked sooner.       Influenza A by PCR NEGATIVE NEGATIVE Final   Influenza B by PCR NEGATIVE NEGATIVE Final    Comment: (NOTE) The Xpert Xpress SARS-CoV-2/FLU/RSV plus assay is intended as an aid in the diagnosis of influenza from Nasopharyngeal swab specimens and should not be used as a sole basis for  treatment. Nasal washings and aspirates are unacceptable for Xpert Xpress SARS-CoV-2/FLU/RSV testing.  Fact Sheet for Patients: BloggerCourse.com  Fact Sheet for Healthcare Providers: SeriousBroker.it  This test is not yet approved or cleared by the [place] FDA and has been authorized for detection and/or diagnosis of SARS-CoV-2 by FDA under an Emergency Use Authorization (EUA). This EUA will remain in effect (meaning this test can be used) for the duration of the COVID-19 declaration under Section 564(b)(1) of the Act, 21 U.S.C. section 360bbb-3(b)(1), unless the authorization is terminated or revoked.  Performed at [facility], [address], [place], [place] [address]   Resp Panel by RT-PCR (Flu A&B, Covid) Nasopharyngeal Swab     Status: None   Collection Time: 06/04/20  1:43 PM   Specimen: Nasopharyngeal Swab; Nasopharyngeal(NP) swabs in vial transport medium  Result Value Ref Range Status   SARS Coronavirus 2 by RT PCR NEGATIVE NEGATIVE Final    Comment: (NOTE) SARS-CoV-2 target nucleic acids are NOT DETECTED.  The SARS-CoV-2 RNA is generally detectable in upper respiratory specimens during the acute phase of infection. The lowest concentration of SARS-CoV-2 viral copies this assay can detect is 138 copies/mL. A negative result does not preclude SARS-Cov-2 infection and should not be used as the sole basis for treatment or other patient management decisions. A negative result may occur with  improper specimen collection/handling, submission of specimen other than nasopharyngeal swab, presence of viral mutation(s) within the areas targeted by this assay, and inadequate number of viral copies(<138 copies/mL). A negative result must be combined with clinical observations, patient history, and epidemiological information. The expected result is Negative.  Fact Sheet for Patients:   BloggerCourse.com  Fact Sheet for Healthcare Providers:  SeriousBroker.it  This test is no t yet approved or cleared by the [place] FDA and  has been authorized for detection and/or diagnosis of SARS-CoV-2 by FDA under an Emergency Use Authorization (EUA). This EUA will remain  in effect (meaning this test can be used) for the duration of the COVID-19 declaration under Section 564(b)(1) of the Act, 21 U.S.C.section 360bbb-3(b)(1), unless the authorization is terminated  or revoked sooner.       Influenza A by PCR NEGATIVE NEGATIVE Final   Influenza B by PCR NEGATIVE NEGATIVE Final    Comment: (NOTE) The Xpert Xpress SARS-CoV-2/FLU/RSV plus assay is intended as an aid in the diagnosis of influenza from Nasopharyngeal swab specimens and should not be used as a sole basis for treatment. Nasal washings and aspirates are unacceptable for Xpert Xpress SARS-CoV-2/FLU/RSV testing.  Fact Sheet for Patients: BloggerCourse.com  Fact Sheet for Healthcare Providers: SeriousBroker.it  This test is not yet approved or cleared by the [place] FDA and has been authorized for detection and/or diagnosis of SARS-CoV-2 by FDA under an Emergency Use Authorization (EUA). This EUA will remain in effect (meaning this test can be used) for the duration of the COVID-19 declaration under Section 564(b)(1) of the Act, 21 U.S.C. section 360bbb-3(b)(1), unless the authorization is terminated or revoked.  Performed at [facility], [address], [place], [place] [address]   CSF culture     Status: None (Preliminary result)   Collection Time: 06/04/20  3:05 PM   Specimen: CSF; Cerebrospinal Fluid  Result Value Ref Range Status   Specimen Description   Final    CSF Performed at [facility], [address], [place], [place] [address]    Special Requests    Final    NONE Performed at [facility], [address], [place], [place] [address]    Gram Stain   Final    WBC PRESENT, PREDOMINANTLY MONONUCLEAR NO ORGANISMS SEEN CYTOSPIN SMEAR    Culture   Final    NO GROWTH 2 DAYS Performed at [facility], [address] [place], [place] [address]    Report Status PENDING  Incomplete         Radiology Studies: CT Angio Head W or Wo Contrast  Result Date: 06/04/2020 CLINICAL DATA:  Neuro deficit, acute, stroke suspected; recent LVO. Additional provided: Right leg weakness. Fall today. EXAM: CT ANGIOGRAPHY HEAD AND NECK CT PERFUSION BRAIN TECHNIQUE: Multidetector CT imaging of the head and neck was performed using the standard protocol during bolus administration of intravenous contrast. Multiplanar CT image reconstructions and MIPs were obtained to evaluate the vascular anatomy. Carotid stenosis measurements (when applicable) are obtained utilizing NASCET criteria, using the distal internal carotid diameter as the denominator. Multiphase CT imaging of the brain was performed following IV bolus contrast injection. Subsequent parametric perfusion maps were calculated using RAPID software. CONTRAST:  OMNIPAQUE IOHEXOL 350 MG/ML SOLN COMPARISON:  Prior CT angiogram head/neck 05/31/2020. FINDINGS: CTA NECK FINDINGS Aortic arch: Aberrant right subclavian artery. The visualized aortic arch is normal in caliber. No hemodynamically significant innominate or proximal subclavian artery stenosis. Right carotid system: CCA and ICA patent within the neck without stenosis. No significant atherosclerotic disease. Left carotid system: CCA and ICA patent within the neck without stenosis. No significant atherosclerotic disease. Vertebral arteries: Streak and beam hardening artifact arising from venous reflux limits evaluation of the V1 left vertebral artery. Within this limitation, the vertebral arteries are patent within the neck without  hemodynamically significant stenosis. Skeleton: Cervical spondylosis. No acute bony abnormality or aggressive osseous lesion. Other neck: No neck mass or cervical lymphadenopathy. Upper chest: No consolidation within the imaged lung apices. Review of the MIP images confirms the above findings CTA HEAD FINDINGS Anterior circulation: The intracranial internal carotid arteries are patent. The M1 middle cerebral arteries are patent. Interval progression of a severe stenosis within a proximal left M2 MCA vessel (series 12, image 28). Multiple additional previously demonstrated severe stenoses within the bilateral M2 and M3 vessels. Hypoplastic right A1 segment. Unchanged appearance of a focal occlusion versus severe stenosis within the A2 left anterior cerebral artery (series 12, image 21). Progressive irregularity with multiple progressive severe stenoses within the left ACA vascular tree distal to this (series 12, image 21). No intracranial aneurysm is identified. Posterior circulation: The  intracranial vertebral arteries are patent. The basilar artery is patent. The posterior cerebral arteries are patent. Redemonstrated mild/moderate stenosis within the proximal P2 left PCA (series 10, image 60). Posterior communicating arteries are hypoplastic or absent bilaterally. Venous sinuses: Within the limitations of contrast timing, no convincing thrombus. Anatomic variants: As described Review of the MIP images confirms the above findings CT Brain Perfusion Findings: CBF (<30%) Volume: 0mL Perfusion (Tmax>6.0s) volume: 77mL (including areas within the left ACA vascular territory and right MCA vascular territory). Mismatch Volume: 77 mL Infarction Location:None identified These results were called by telephone at the time of interpretation on 06/04/2020 at 2:55 pm to provider Dr. Otelia Limes, Who verbally acknowledged these results. IMPRESSION: CTA neck: 1. The common carotid, internal carotid and vertebral arteries are patent  within the neck without hemodynamically significant stenosis. 2. Aberrant right subclavian artery. CTA head: 1. Focal occlusion versus severe stenosis within the A2 left anterior cerebral artery, similar in appearance as compared to the CTA of 05/31/2020. Progressive vessel irregularity with multiple progressive severe stenoses within the left ACA vascular tree distal to this. 2. Progressive severe focal stenosis within a proximal left M2 MCA vessel. 3. Multiple additional previously demonstrated severe stenoses within the bilateral M2 and M3 MCA vessels. 4. Mild/moderate stenosis within the proximal P2 left PCA. 5. The above stenoses may be secondary to vasculitis and/or atherosclerotic disease. CT perfusion head: No core infarct is identified. Areas of hypoperfusion totaling 77 mL within the left ACA vascular territory and right MCA vascular territory (utilizing the Tmax>6 seconds threshold). Electronically Signed   By: Jackey Loge DO   On: 06/04/2020 15:16   CT Angio Neck W and/or Wo Contrast  Result Date: 06/04/2020 CLINICAL DATA:  Neuro deficit, acute, stroke suspected; recent LVO. Additional provided: Right leg weakness. Fall today. EXAM: CT ANGIOGRAPHY HEAD AND NECK CT PERFUSION BRAIN TECHNIQUE: Multidetector CT imaging of the head and neck was performed using the standard protocol during bolus administration of intravenous contrast. Multiplanar CT image reconstructions and MIPs were obtained to evaluate the vascular anatomy. Carotid stenosis measurements (when applicable) are obtained utilizing NASCET criteria, using the distal internal carotid diameter as the denominator. Multiphase CT imaging of the brain was performed following IV bolus contrast injection. Subsequent parametric perfusion maps were calculated using RAPID software. CONTRAST:  OMNIPAQUE IOHEXOL 350 MG/ML SOLN COMPARISON:  Prior CT angiogram head/neck 05/31/2020. FINDINGS: CTA NECK FINDINGS Aortic arch: Aberrant right subclavian  artery. The visualized aortic arch is normal in caliber. No hemodynamically significant innominate or proximal subclavian artery stenosis. Right carotid system: CCA and ICA patent within the neck without stenosis. No significant atherosclerotic disease. Left carotid system: CCA and ICA patent within the neck without stenosis. No significant atherosclerotic disease. Vertebral arteries: Streak and beam hardening artifact arising from venous reflux limits evaluation of the V1 left vertebral artery. Within this limitation, the vertebral arteries are patent within the neck without hemodynamically significant stenosis. Skeleton: Cervical spondylosis. No acute bony abnormality or aggressive osseous lesion. Other neck: No neck mass or cervical lymphadenopathy. Upper chest: No consolidation within the imaged lung apices. Review of the MIP images confirms the above findings CTA HEAD FINDINGS Anterior circulation: The intracranial internal carotid arteries are patent. The M1 middle cerebral arteries are patent. Interval progression of a severe stenosis within a proximal left M2 MCA vessel (series 12, image 28). Multiple additional previously demonstrated severe stenoses within the bilateral M2 and M3 vessels. Hypoplastic right A1 segment. Unchanged appearance of a focal occlusion versus severe  stenosis within the A2 left anterior cerebral artery (series 12, image 21). Progressive irregularity with multiple progressive severe stenoses within the left ACA vascular tree distal to this (series 12, image 21). No intracranial aneurysm is identified. Posterior circulation: The intracranial vertebral arteries are patent. The basilar artery is patent. The posterior cerebral arteries are patent. Redemonstrated mild/moderate stenosis within the proximal P2 left PCA (series 10, image 60). Posterior communicating arteries are hypoplastic or absent bilaterally. Venous sinuses: Within the limitations of contrast timing, no convincing  thrombus. Anatomic variants: As described Review of the MIP images confirms the above findings CT Brain Perfusion Findings: CBF (<30%) Volume: 0mL Perfusion (Tmax>6.0s) volume: 77mL (including areas within the left ACA vascular territory and right MCA vascular territory). Mismatch Volume: 77 mL Infarction Location:None identified These results were called by telephone at the time of interpretation on 06/04/2020 at 2:55 pm to provider Dr. Otelia Limes, Who verbally acknowledged these results. IMPRESSION: CTA neck: 1. The common carotid, internal carotid and vertebral arteries are patent within the neck without hemodynamically significant stenosis. 2. Aberrant right subclavian artery. CTA head: 1. Focal occlusion versus severe stenosis within the A2 left anterior cerebral artery, similar in appearance as compared to the CTA of 05/31/2020. Progressive vessel irregularity with multiple progressive severe stenoses within the left ACA vascular tree distal to this. 2. Progressive severe focal stenosis within a proximal left M2 MCA vessel. 3. Multiple additional previously demonstrated severe stenoses within the bilateral M2 and M3 MCA vessels. 4. Mild/moderate stenosis within the proximal P2 left PCA. 5. The above stenoses may be secondary to vasculitis and/or atherosclerotic disease. CT perfusion head: No core infarct is identified. Areas of hypoperfusion totaling 77 mL within the left ACA vascular territory and right MCA vascular territory (utilizing the Tmax>6 seconds threshold). Electronically Signed   By: Jackey Loge DO   On: 06/04/2020 15:16   MR BRAIN W WO CONTRAST  Result Date: 06/04/2020 CLINICAL DATA:  Stroke EXAM: MRI HEAD WITHOUT AND WITH CONTRAST TECHNIQUE: Multiplanar, multiecho pulse sequences of the brain and surrounding structures were obtained without and with intravenous contrast. CONTRAST:  10mL GADAVIST GADOBUTROL 1 MMOL/ML IV SOLN COMPARISON:  CT angio head and neck 06/04/2020 FINDINGS: Brain:  Bilateral recent infarcts in the anterior and middle cerebral artery territories similar to the recent MRI. Small new areas of restricted diffusion in the right frontal cortex and in the right parietal white matter and cortex compatible with interval infarct since 05/30/2020. In addition, there is a new area of restricted diffusion in the left anterior cerebellum. Following contrast infusion, there is mild enhancement of several subacute infarcts and occluding the left anterior genu of the corpus callosum, left corona radiata, and left parietal lobe compatible with subacute infarct. Ventricle size normal. 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 This shows a slight amount of enhancement however there is volume loss in the majority of this appears to be a chronic infarct. Small area of chronic hemorrhagic infarct in the right temporoparietal lobe. Moderate chronic microvascular ischemic change in the white matter. Chronic left cerebellar infarct. Negative for mass lesion. Vascular: Normal arterial flow voids. Skull and upper cervical spine: Negative Sinuses/Orbits: Extensive mucosal edema throughout the paranasal sinuses. Air-fluid level right maxillary sinus. Negative orbit Other: None IMPRESSION: Multiple areas of subacute infarct in the anterior and middle cerebral arteries bilaterally as noted on the recent MRI of 05/30/2020 Several small areas of acute infarct have developed since the prior MRI including the right frontal cortex, right parietal white  matter and cortex, and the left cerebellum. Electronically Signed   By: Marlan Palau M.D.   On: 06/04/2020 17:55   CT CEREBRAL PERFUSION W CONTRAST  Result Date: 06/04/2020 CLINICAL DATA:  Neuro deficit, acute, stroke suspected; recent LVO. Additional provided: Right leg weakness. Fall today. EXAM: CT ANGIOGRAPHY HEAD AND NECK CT PERFUSION BRAIN TECHNIQUE: Multidetector CT imaging of the head and neck was performed using the standard protocol  during bolus administration of intravenous contrast. Multiplanar CT image reconstructions and MIPs were obtained to evaluate the vascular anatomy. Carotid stenosis measurements (when applicable) are obtained utilizing NASCET criteria, using the distal internal carotid diameter as the denominator. Multiphase CT imaging of the brain was performed following IV bolus contrast injection. Subsequent parametric perfusion maps were calculated using RAPID software. CONTRAST:  OMNIPAQUE IOHEXOL 350 MG/ML SOLN COMPARISON:  Prior CT angiogram head/neck 05/31/2020. FINDINGS: CTA NECK FINDINGS Aortic arch: Aberrant right subclavian artery. The visualized aortic arch is normal in caliber. No hemodynamically significant innominate or proximal subclavian artery stenosis. Right carotid system: CCA and ICA patent within the neck without stenosis. No significant atherosclerotic disease. Left carotid system: CCA and ICA patent within the neck without stenosis. No significant atherosclerotic disease. Vertebral arteries: Streak and beam hardening artifact arising from venous reflux limits evaluation of the V1 left vertebral artery. Within this limitation, the vertebral arteries are patent within the neck without hemodynamically significant stenosis. Skeleton: Cervical spondylosis. No acute bony abnormality or aggressive osseous lesion. Other neck: No neck mass or cervical lymphadenopathy. Upper chest: No consolidation within the imaged lung apices. Review of the MIP images confirms the above findings CTA HEAD FINDINGS Anterior circulation: The intracranial internal carotid arteries are patent. The M1 middle cerebral arteries are patent. Interval progression of a severe stenosis within a proximal left M2 MCA vessel (series 12, image 28). Multiple additional previously demonstrated severe stenoses within the bilateral M2 and M3 vessels. Hypoplastic right A1 segment. Unchanged appearance of a focal occlusion versus severe stenosis  within the A2 left anterior cerebral artery (series 12, image 21). Progressive irregularity with multiple progressive severe stenoses within the left ACA vascular tree distal to this (series 12, image 21). No intracranial aneurysm is identified. Posterior circulation: The intracranial vertebral arteries are patent. The basilar artery is patent. The posterior cerebral arteries are patent. Redemonstrated mild/moderate stenosis within the proximal P2 left PCA (series 10, image 60). Posterior communicating arteries are hypoplastic or absent bilaterally. Venous sinuses: Within the limitations of contrast timing, no convincing thrombus. Anatomic variants: As described Review of the MIP images confirms the above findings CT Brain Perfusion Findings: CBF (<30%) Volume: 97mL Perfusion (Tmax>6.0s) volume: 70mL (including areas within the left ACA vascular territory and right MCA vascular territory). Mismatch Volume: 77 mL Infarction Location:None identified These results were called by telephone at the time of interpretation on 06/04/2020 at 2:55 pm to provider Dr. Otelia Limes, Who verbally acknowledged these results. IMPRESSION: CTA neck: 1. The common carotid, internal carotid and vertebral arteries are patent within the neck without hemodynamically significant stenosis. 2. Aberrant right subclavian artery. CTA head: 1. Focal occlusion versus severe stenosis within the A2 left anterior cerebral artery, similar in appearance as compared to the CTA of 05/31/2020. Progressive vessel irregularity with multiple progressive severe stenoses within the left ACA vascular tree distal to this. 2. Progressive severe focal stenosis within a proximal left M2 MCA vessel. 3. Multiple additional previously demonstrated severe stenoses within the bilateral M2 and M3 MCA vessels. 4. Mild/moderate stenosis within the proximal  P2 left PCA. 5. The above stenoses may be secondary to vasculitis and/or atherosclerotic disease. CT perfusion head: No core  infarct is identified. Areas of hypoperfusion totaling 77 mL within the left ACA vascular territory and right MCA vascular territory (utilizing the Tmax>6 seconds threshold). Electronically Signed   By: Jackey Loge DO   On: 06/04/2020 15:16   CT HEAD CODE STROKE WO CONTRAST  Result Date: 06/04/2020 CLINICAL DATA:  Code stroke. Neuro deficit, acute, stroke suspected. Additional history provided: Last known normal 1:15 p.m., weakness, fall in part today, 3 recent strokes. EXAM: CT HEAD WITHOUT CONTRAST TECHNIQUE: Contiguous axial images were obtained from the base of the skull through the vertex without intravenous contrast. COMPARISON:  CT angiogram head/neck 05/31/2020. Brain MRI 05/30/2020. FINDINGS: Brain: Mild cerebral and cerebellar atrophy. No interval acute demarcated cortical infarct is identified. No evidence of acute intracranial hemorrhage. Redemonstrated subacute infarction changes within the anterior left frontal lobe white matter and left callosal body/genu. Additional known subacute infarcts within the bilateral cerebral hemispheres were better appreciated on the brain MRI of 05/30/2020. Redemonstrated chronic cortical/subcortical infarct within the left parietal lobe. Stable background advanced chronic small vessel ischemic disease within the cerebral white matter, including multiple chronic small-vessel infarcts within the bilateral cerebral white matter. Redemonstrated chronic infarct within the left cerebellum. No extra-axial fluid collection. No evidence of intracranial mass. No midline shift. Vascular: No hyperdense vessel. Skull: Normal. Negative for fracture or focal lesion. Sinuses/Orbits: Visualized orbits show no acute finding. Pansinusitis. Most notably, there is severe mucosal thickening within the left frontal sinus, within the bilateral ethmoid air cells and within the bilateral maxillary sinuses. Superimposed scattered small volume frothy secretions. ASPECTS [facility] Stroke Program  Early CT Score) - Ganglionic level infarction (caudate, lentiform nuclei, internal capsule, insula, M1-M3 cortex): 7 - Supraganglionic infarction (M4-M6 cortex): 3 Total score (0-10 with 10 being normal): 10 (when discounting subacute and chronic infarcts). These results were called by telephone at the time of interpretation on 06/04/2020 at 2:08 pm to provider DAVID YAO , who verbally acknowledged these results. IMPRESSION: Comparison is made to the prior CT head of 05/31/2020. No appreciable interval acute infarct. Redemonstrated subacute infarcts within the anterior left frontal lobe white matter and left callosal body/genu. Additional known subacute infarcts within the bilateral cerebral hemispheres were better appreciated on the brain MRI of 05/30/2020. Redemonstrated chronic cortical/subcortical infarct within the left parietal lobe. Stable background advanced cerebral white matter chronic small vessel ischemic disease. Redemonstrated chronic left cerebellar infarct. Stable mild generalized parenchymal atrophy. Pansinusitis. Electronically Signed   By: Jackey Loge DO   On: 06/04/2020 14:09        Scheduled Meds: . aspirin  325 mg Oral Daily  . atorvastatin  80 mg Oral Daily  . clopidogrel  75 mg Oral Daily  . feeding supplement (GLUCERNA SHAKE)  237 mL Oral BID BM  . insulin aspart  0-15 Units Subcutaneous TID AC & HS  . insulin glargine  10 Units Subcutaneous Daily  . levETIRAcetam  500 mg Oral BID  . multivitamin with minerals  1 tablet Oral Daily  . Ensure Max Protein  11 oz Oral QHS   Continuous Infusions: . methylPREDNISolone (SOLU-MEDROL) injection 1,000 mg (06/06/20 1007)          Glade Lloyd, MD [facility] 06/06/2020, 10:25 AM

## 2020-06-07 LAB — BETA-2-GLYCOPROTEIN I ABS, IGG/M/A
Beta-2 Glyco I IgG: <9 GPI IgG units (ref 0–20)
Beta-2-Glycoprotein I IgA: <9 GPI IgA units (ref 0–25)
Beta-2-Glycoprotein I IgM: <9 GPI IgM units (ref 0–32)

## 2020-06-07 LAB — IGG CSF INDEX
Albumin CSF-mCnc: 16 mg/dL (ref 10–45)
Albumin: 3.7 g/dL — ABNORMAL LOW (ref 4.0–5.0)
CSF IgG Index: 0.6 (ref 0.0–0.7)
IgG (Immunoglobin G), Serum: 1455 mg/dL (ref 603–1613)
IgG, CSF: 4 mg/dL (ref 0.0–10.3)
IgG/Alb Ratio, CSF: 0.25 (ref 0.00–0.25)

## 2020-06-07 LAB — LUPUS ANTICOAGULANT PANEL
DRVVT: 39.3 s (ref 0.0–47.0)
PTT Lupus Anticoagulant: 40.5 s (ref 0.0–51.9)

## 2020-06-07 LAB — HOMOCYSTEINE: Homocysteine: 8.2 umol/L (ref 0.0–14.5)

## 2020-06-07 LAB — VARICELLA ZOSTER ANTIBODY, IGM: Varicella-Zoster Ab, IgM: <0.91 index (ref 0.00–0.90)

## 2020-06-07 LAB — PROTEIN S ACTIVITY: Protein S Activity: 74 % (ref 63–140)

## 2020-06-07 LAB — VDRL, CSF: VDRL Quant, CSF: NONREACTIVE

## 2020-06-07 LAB — ANA W/REFLEX IF POSITIVE: Anti Nuclear Antibody (ANA): NEGATIVE

## 2020-06-07 LAB — ANTI-SMITH ANTIBODY: ENA SM Ab Ser-aCnc: <0.2 AI (ref 0.0–0.9)

## 2020-06-07 LAB — PROTEIN C ACTIVITY: Protein C Activity: 114 % (ref 73–180)

## 2020-06-07 LAB — PROTEIN S, TOTAL: Protein S Ag, Total: 95 % (ref 60–150)

## 2020-06-07 LAB — ANTI-DNA ANTIBODY, DOUBLE-STRANDED: ds DNA Ab: <1 IU/mL (ref 0–9)

## 2020-06-07 NOTE — Progress Notes (Signed)
Patient is currently being transported to [facility], report has been given.

## 2020-06-08 ENCOUNTER — Encounter: Payer: Self-pay | Admitting: Occupational Therapy

## 2020-06-08 DIAGNOSIS — I776 Arteritis, unspecified: Secondary | ICD-10-CM | POA: Diagnosis not present

## 2020-06-08 DIAGNOSIS — I639 Cerebral infarction, unspecified: Secondary | ICD-10-CM | POA: Diagnosis not present

## 2020-06-08 LAB — CSF CULTURE W GRAM STAIN: Culture: NO GROWTH

## 2020-06-08 LAB — PROTEIN C, TOTAL: Protein C, Total: 99 % (ref 60–150)

## 2020-06-09 LAB — VARICELLA-ZOSTER BY PCR

## 2020-06-10 ENCOUNTER — Encounter: Payer: Self-pay | Admitting: Occupational Therapy

## 2020-06-10 DIAGNOSIS — I672 Cerebral atherosclerosis: Secondary | ICD-10-CM | POA: Diagnosis not present

## 2020-06-10 DIAGNOSIS — I639 Cerebral infarction, unspecified: Secondary | ICD-10-CM | POA: Diagnosis not present

## 2020-06-10 LAB — OLIGOCLONAL BANDS, CSF + SERM

## 2020-06-11 DIAGNOSIS — R531 Weakness: Secondary | ICD-10-CM | POA: Diagnosis not present

## 2020-06-11 LAB — PROTHROMBIN GENE MUTATION

## 2020-06-11 LAB — FACTOR 5 LEIDEN

## 2020-06-15 ENCOUNTER — Inpatient Hospital Stay (HOSPITAL_COMMUNITY)
Admission: EM | Admit: 2020-06-15 | Discharge: 2020-06-23 | DRG: 065 | Disposition: A | Discharge status: Rehabilitation Facility | Payer: Medicaid Other | Attending: Internal Medicine | Admitting: Internal Medicine

## 2020-06-15 ENCOUNTER — Emergency Department (HOSPITAL_COMMUNITY): Payer: Medicaid Other

## 2020-06-15 ENCOUNTER — Encounter: Payer: Self-pay | Admitting: Occupational Therapy

## 2020-06-15 ENCOUNTER — Other Ambulatory Visit: Payer: Self-pay

## 2020-06-15 DIAGNOSIS — Z20822 Contact with and (suspected) exposure to covid-19: Secondary | ICD-10-CM | POA: Diagnosis present

## 2020-06-15 DIAGNOSIS — R4701 Aphasia: Secondary | ICD-10-CM | POA: Diagnosis not present

## 2020-06-15 DIAGNOSIS — F32A Depression, unspecified: Secondary | ICD-10-CM | POA: Diagnosis present

## 2020-06-15 DIAGNOSIS — Z7902 Long term (current) use of antithrombotics/antiplatelets: Secondary | ICD-10-CM

## 2020-06-15 DIAGNOSIS — F1490 Cocaine use, unspecified, uncomplicated: Secondary | ICD-10-CM | POA: Diagnosis present

## 2020-06-15 DIAGNOSIS — Y9301 Activity, walking, marching and hiking: Secondary | ICD-10-CM | POA: Diagnosis present

## 2020-06-15 DIAGNOSIS — I10 Essential (primary) hypertension: Secondary | ICD-10-CM | POA: Diagnosis present

## 2020-06-15 DIAGNOSIS — E876 Hypokalemia: Secondary | ICD-10-CM | POA: Diagnosis present

## 2020-06-15 DIAGNOSIS — R4702 Dysphasia: Secondary | ICD-10-CM | POA: Diagnosis present

## 2020-06-15 DIAGNOSIS — Z79899 Other long term (current) drug therapy: Secondary | ICD-10-CM

## 2020-06-15 DIAGNOSIS — E785 Hyperlipidemia, unspecified: Secondary | ICD-10-CM | POA: Diagnosis present

## 2020-06-15 DIAGNOSIS — I6320 Cerebral infarction due to unspecified occlusion or stenosis of unspecified precerebral arteries: Secondary | ICD-10-CM | POA: Diagnosis not present

## 2020-06-15 DIAGNOSIS — Z87891 Personal history of nicotine dependence: Secondary | ICD-10-CM | POA: Diagnosis not present

## 2020-06-15 DIAGNOSIS — I639 Cerebral infarction, unspecified: Principal | ICD-10-CM | POA: Diagnosis present

## 2020-06-15 DIAGNOSIS — Z7982 Long term (current) use of aspirin: Secondary | ICD-10-CM

## 2020-06-15 DIAGNOSIS — G9340 Encephalopathy, unspecified: Secondary | ICD-10-CM | POA: Diagnosis present

## 2020-06-15 DIAGNOSIS — I7389 Other specified peripheral vascular diseases: Secondary | ICD-10-CM | POA: Diagnosis present

## 2020-06-15 DIAGNOSIS — G459 Transient cerebral ischemic attack, unspecified: Secondary | ICD-10-CM

## 2020-06-15 DIAGNOSIS — Z8673 Personal history of transient ischemic attack (TIA), and cerebral infarction without residual deficits: Secondary | ICD-10-CM | POA: Diagnosis not present

## 2020-06-15 DIAGNOSIS — Z794 Long term (current) use of insulin: Secondary | ICD-10-CM

## 2020-06-15 DIAGNOSIS — Z7984 Long term (current) use of oral hypoglycemic drugs: Secondary | ICD-10-CM | POA: Diagnosis not present

## 2020-06-15 DIAGNOSIS — R569 Unspecified convulsions: Secondary | ICD-10-CM | POA: Diagnosis not present

## 2020-06-15 DIAGNOSIS — E1165 Type 2 diabetes mellitus with hyperglycemia: Secondary | ICD-10-CM | POA: Diagnosis present

## 2020-06-15 DIAGNOSIS — W1830XA Fall on same level, unspecified, initial encounter: Secondary | ICD-10-CM | POA: Diagnosis present

## 2020-06-15 LAB — CBC
HCT: 40.1 % (ref 39.0–52.0)
Hemoglobin: 12.9 g/dL — ABNORMAL LOW (ref 13.0–17.0)
MCH: 25.9 pg — ABNORMAL LOW (ref 26.0–34.0)
MCHC: 32.2 g/dL (ref 30.0–36.0)
MCV: 80.4 fL (ref 80.0–100.0)
Platelets: 334 10*3/uL (ref 150–400)
RBC: 4.99 MIL/uL (ref 4.22–5.81)
RDW: 13.2 % (ref 11.5–15.5)
WBC: 8.9 10*3/uL (ref 4.0–10.5)
nRBC: 0 % (ref 0.0–0.2)

## 2020-06-15 LAB — I-STAT CHEM 8, ED
BUN: 20 mg/dL (ref 6–20)
Calcium, Ion: 1.12 mmol/L — ABNORMAL LOW (ref 1.15–1.40)
Chloride: 98 mmol/L (ref 98–111)
Creatinine, Ser: 1.5 mg/dL — ABNORMAL HIGH (ref 0.61–1.24)
Glucose, Bld: 108 mg/dL — ABNORMAL HIGH (ref 70–99)
HCT: 40 % (ref 39.0–52.0)
Hemoglobin: 13.6 g/dL (ref 13.0–17.0)
Potassium: 3.4 mmol/L — ABNORMAL LOW (ref 3.5–5.1)
Sodium: 139 mmol/L (ref 135–145)
TCO2: 29 mmol/L (ref 22–32)

## 2020-06-15 LAB — COMPREHENSIVE METABOLIC PANEL
ALT: 15 U/L (ref 0–44)
AST: 12 U/L — ABNORMAL LOW (ref 15–41)
Albumin: 3.5 g/dL (ref 3.5–5.0)
Alkaline Phosphatase: 57 U/L (ref 38–126)
Anion gap: 10 (ref 5–15)
BUN: 18 mg/dL (ref 6–20)
CO2: 29 mmol/L (ref 22–32)
Calcium: 9.1 mg/dL (ref 8.9–10.3)
Chloride: 99 mmol/L (ref 98–111)
Creatinine, Ser: 1.43 mg/dL — ABNORMAL HIGH (ref 0.61–1.24)
GFR, Estimated: >60 mL/min (ref >60)
Glucose, Bld: 109 mg/dL — ABNORMAL HIGH (ref 70–99)
Potassium: 3.4 mmol/L — ABNORMAL LOW (ref 3.5–5.1)
Sodium: 138 mmol/L (ref 135–145)
Total Bilirubin: 0.8 mg/dL (ref 0.3–1.2)
Total Protein: 6.7 g/dL (ref 6.5–8.1)

## 2020-06-15 LAB — DIFFERENTIAL
Abs Immature Granulocytes: 0.04 10*3/uL (ref 0.00–0.07)
Basophils Absolute: 0 10*3/uL (ref 0.0–0.1)
Basophils Relative: 0 %
Eosinophils Absolute: 0.1 10*3/uL (ref 0.0–0.5)
Eosinophils Relative: 1 %
Immature Granulocytes: 1 %
Lymphocytes Relative: 40 %
Lymphs Abs: 3.5 10*3/uL (ref 0.7–4.0)
Monocytes Absolute: 0.9 10*3/uL (ref 0.1–1.0)
Monocytes Relative: 11 %
Neutro Abs: 4.2 10*3/uL (ref 1.7–7.7)
Neutrophils Relative %: 47 %

## 2020-06-15 LAB — RESP PANEL BY RT-PCR (FLU A&B, COVID) ARPGX2
Influenza A by PCR: NEGATIVE
Influenza B by PCR: NEGATIVE
SARS Coronavirus 2 by RT PCR: NEGATIVE

## 2020-06-15 LAB — APTT: aPTT: 30 seconds (ref 24–36)

## 2020-06-15 LAB — CBG MONITORING, ED: Glucose-Capillary: 101 mg/dL — ABNORMAL HIGH (ref 70–99)

## 2020-06-15 LAB — PROTIME-INR
INR: 1.1 (ref 0.8–1.2)
Prothrombin Time: 13.8 seconds (ref 11.4–15.2)

## 2020-06-15 LAB — ETHANOL: Alcohol, Ethyl (B): <10 mg/dL (ref <10)

## 2020-06-15 IMAGING — CT CT ANGIO NECK
2 of 11 series · 6 of 34 positions shown · IV contrast (omnipaque)
Comparison: CTA head neck [DATE]

CLINICAL DATA: Right-sided deficits and aphasia

EXAM:
CT ANGIOGRAPHY HEAD AND NECK
TECHNIQUE: Multidetector CT imaging of the head and neck was performed using
the standard protocol during bolus administration of intravenous
contrast. Multiplanar CT image reconstructions and MIPs were
obtained to evaluate the vascular anatomy. Carotid stenosis
measurements (when applicable) are obtained utilizing NASCET
criteria, using the distal internal carotid diameter as the
denominator.
CONTRAST:  75mL OMNIPAQUE IOHEXOL 350 MG/ML SOLN

[Series 10: sagittal · sagittal · 0.31mm/px · 1 of 62 slices shown]
[im 17/62  soft-tissue]
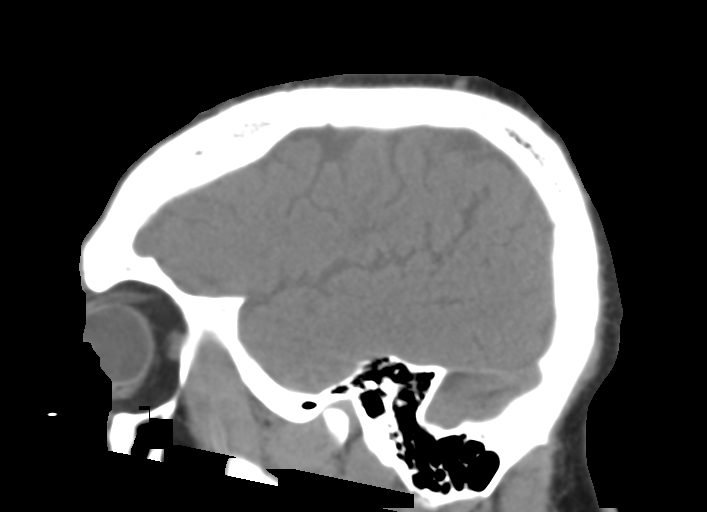

[Series 11: cta neck axial · axial · 0.40mm/px · z∈[-322,-71]mm · 5 of 377 slices shown]
[im 63/377  soft-tissue]
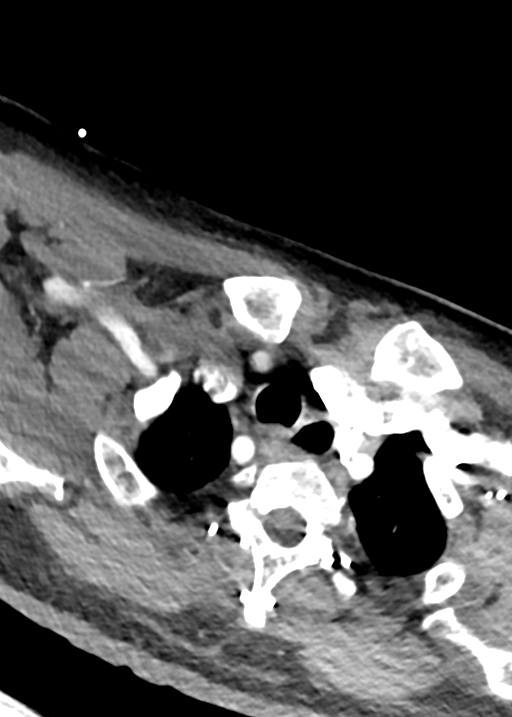
[im 126/377  bone]
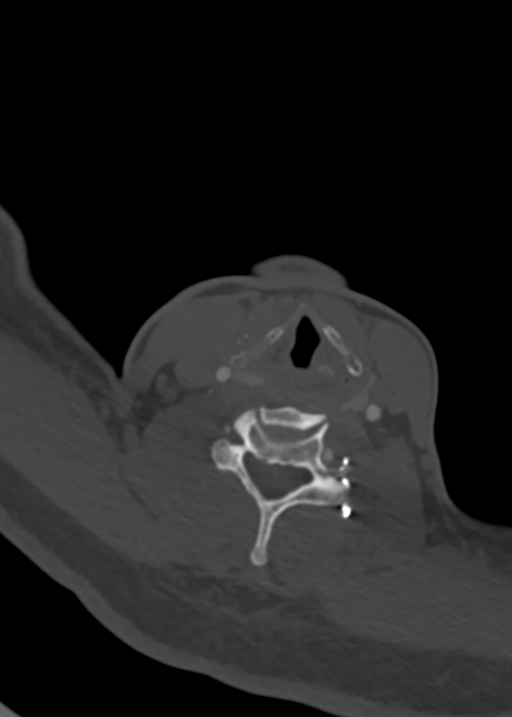
[im 189/377  soft-tissue]
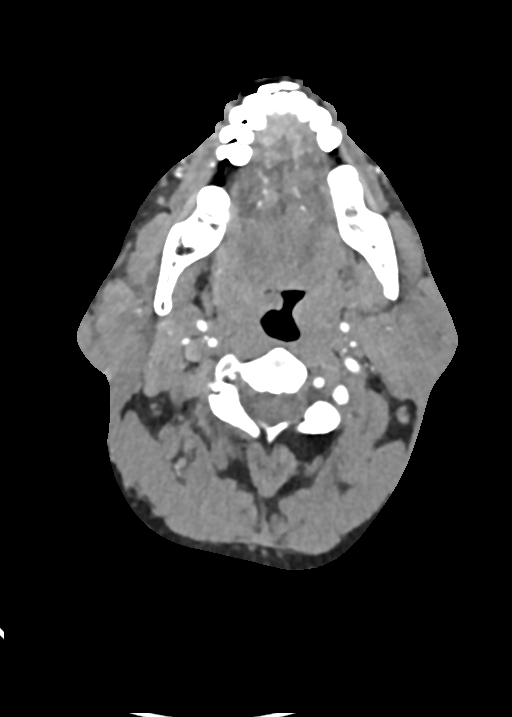
[im 251/377  bone]
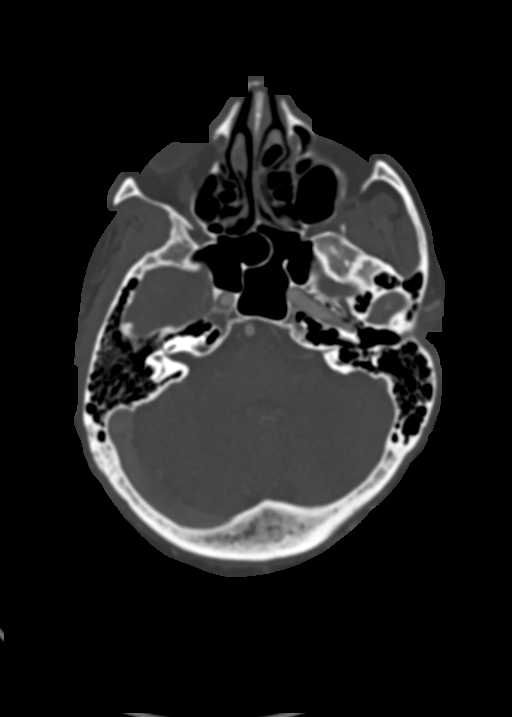
[im 314/377  soft-tissue]
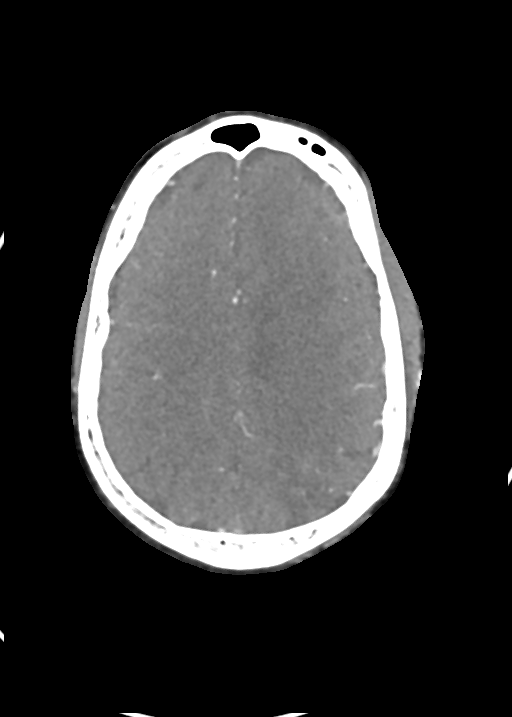

[6 of 34 positions shown; findings below may reference images not displayed]

FINDINGS: CT HEAD FINDINGS

Brain: Old infarcts of the left cerebellum and posterior left
parietal lobe. No acute hemorrhage. The size and configuration of
the ventricles and extra-axial CSF spaces are normal. There is
hypoattenuation of the periventricular white matter, most commonly
indicating chronic ischemic microangiopathy.

Skull: The visualized skull base, calvarium and extracranial soft
tissues are normal.

Sinuses/Orbits: No fluid levels or advanced mucosal thickening of
the visualized paranasal sinuses. No mastoid or middle ear effusion.
The orbits are normal.

CTA NECK FINDINGS

SKELETON: There is no bony spinal canal stenosis. No lytic or
blastic lesion.

OTHER NECK: Normal pharynx, larynx and major salivary glands. No
cervical lymphadenopathy. Unremarkable thyroid gland.

UPPER CHEST: No pneumothorax or pleural effusion. No nodules or
masses.

AORTIC ARCH:

There is no calcific atherosclerosis of the aortic arch. There is no
aneurysm, dissection or hemodynamically significant stenosis of the
visualized portion of the aorta. Aberrant right subclavian artery.
The visualized proximal subclavian arteries are widely patent.

RIGHT CAROTID SYSTEM: Normal without aneurysm, dissection or
stenosis.

LEFT CAROTID SYSTEM: Normal without aneurysm, dissection or
stenosis.

VERTEBRAL ARTERIES: Left dominant configuration. Both origins are
clearly patent. There is no dissection, occlusion or flow-limiting
stenosis to the skull base (V1-V3 segments).

CTA HEAD FINDINGS

POSTERIOR CIRCULATION:

--Vertebral arteries: Normal V4 segments.

--Inferior cerebellar arteries: Normal.

--Basilar artery: Normal.

--Superior cerebellar arteries: Normal.

--Posterior cerebral arteries (PCA): Mild bilateral multifocal
stenoses are unchanged.

ANTERIOR CIRCULATION:

--Intracranial internal carotid arteries: Normal.

--Anterior cerebral arteries (ACA): Unchanged area of severe
stenosis of the left A2 segment

--Middle cerebral arteries (MCA): There are multifocal severe
bilateral stenoses, unchanged. Stenosis are most severe at the
distal M1 and proximal M2 segments.

VENOUS SINUSES: As permitted by contrast timing, patent.

ANATOMIC VARIANTS: None

Review of the MIP images confirms the above findings.
IMPRESSION: 1. No emergent large vessel occlusion.
2. Unchanged multifocal severe intracranial stenoses, including
severe stenosis of the left anterior cerebral artery A2 segment and
severe bilateral MCA distal M1 and proximal M2 stenoses.
3. Old left cerebellar and posterior left parietal infarcts.
4. Aberrant right subclavian artery.

## 2020-06-15 IMAGING — CT CT ANGIO HEAD
2 of 11 series · 6 of 34 positions shown · IV contrast (OMNI 350)
Comparison: CTA head neck [DATE]

CLINICAL DATA: Right-sided deficits and aphasia

EXAM:
CT ANGIOGRAPHY HEAD AND NECK
TECHNIQUE: Multidetector CT imaging of the head and neck was performed using
the standard protocol during bolus administration of intravenous
contrast. Multiplanar CT image reconstructions and MIPs were
obtained to evaluate the vascular anatomy. Carotid stenosis
measurements (when applicable) are obtained utilizing NASCET
criteria, using the distal internal carotid diameter as the
denominator.
CONTRAST:  75mL OMNIPAQUE IOHEXOL 350 MG/ML SOLN

[Series 10: sagittal · sagittal · 0.31mm/px · 1 of 62 slices shown]
[im 17/62  soft-tissue]
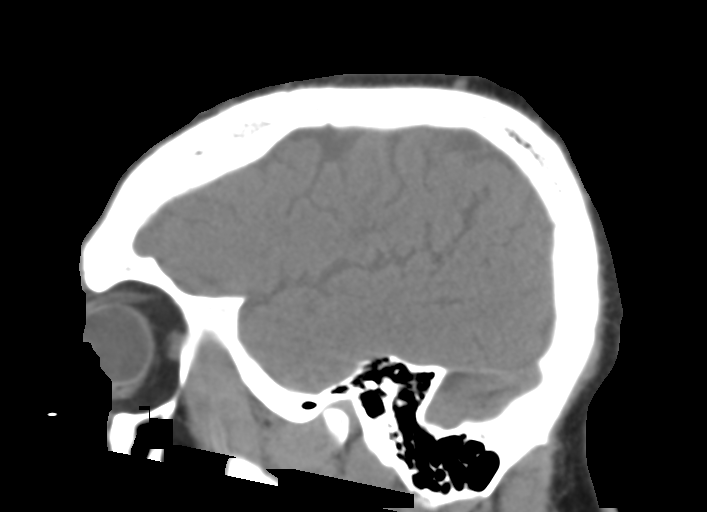

[Series 11: cta neck axial · axial · 0.40mm/px · z∈[-322,-71]mm · 5 of 377 slices shown]
[im 63/377  soft-tissue]
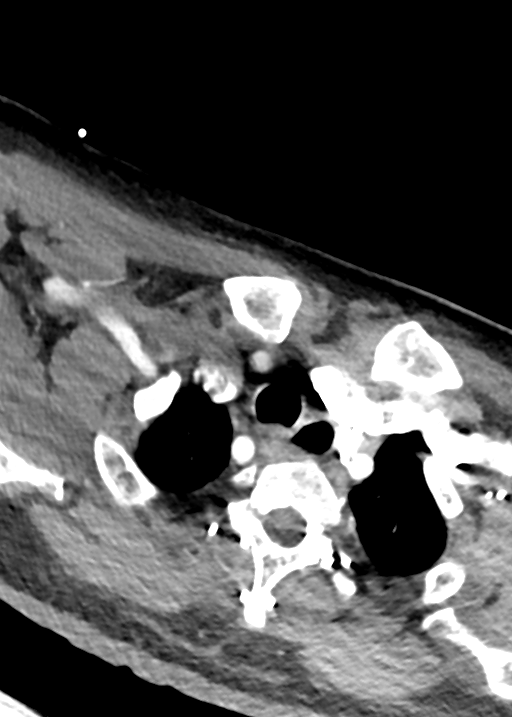
[im 126/377  bone]
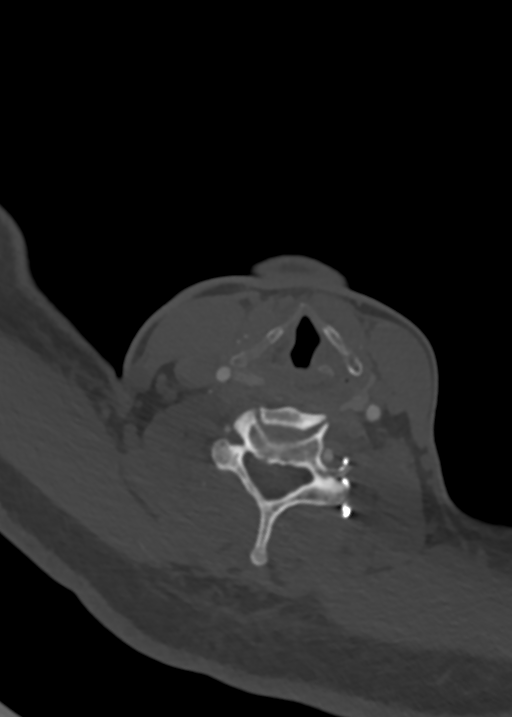
[im 189/377  soft-tissue]
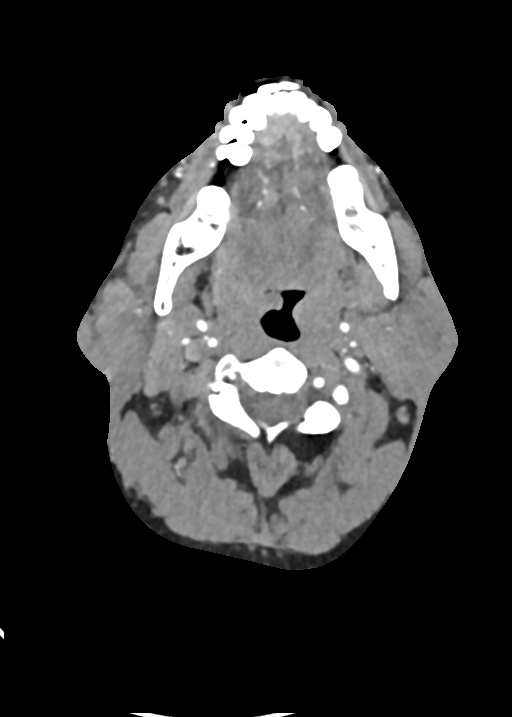
[im 251/377  bone]
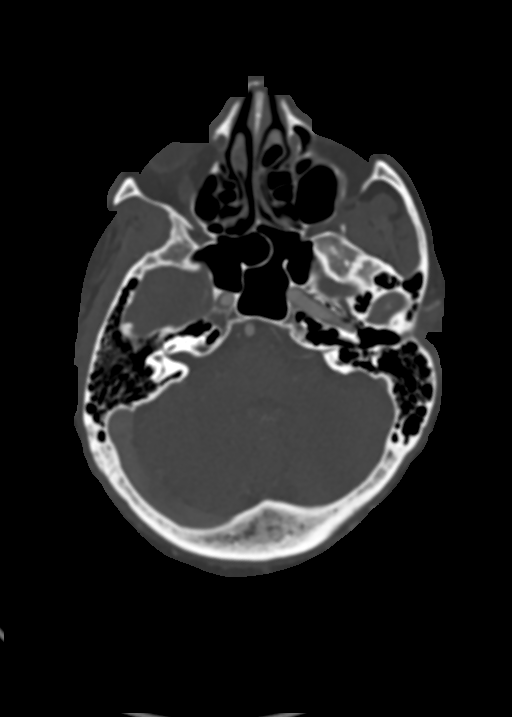
[im 314/377  soft-tissue]
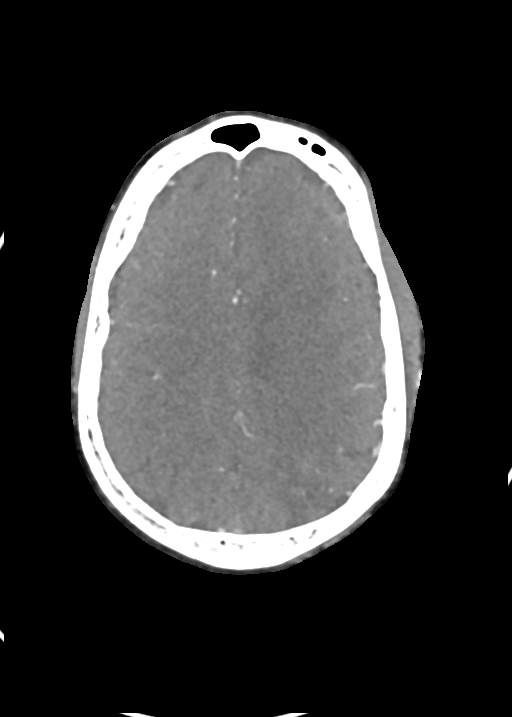

[6 of 34 positions shown; findings below may reference images not displayed]

FINDINGS: CT HEAD FINDINGS

Brain: Old infarcts of the left cerebellum and posterior left
parietal lobe. No acute hemorrhage. The size and configuration of
the ventricles and extra-axial CSF spaces are normal. There is
hypoattenuation of the periventricular white matter, most commonly
indicating chronic ischemic microangiopathy.

Skull: The visualized skull base, calvarium and extracranial soft
tissues are normal.

Sinuses/Orbits: No fluid levels or advanced mucosal thickening of
the visualized paranasal sinuses. No mastoid or middle ear effusion.
The orbits are normal.

CTA NECK FINDINGS

SKELETON: There is no bony spinal canal stenosis. No lytic or
blastic lesion.

OTHER NECK: Normal pharynx, larynx and major salivary glands. No
cervical lymphadenopathy. Unremarkable thyroid gland.

UPPER CHEST: No pneumothorax or pleural effusion. No nodules or
masses.

AORTIC ARCH:

There is no calcific atherosclerosis of the aortic arch. There is no
aneurysm, dissection or hemodynamically significant stenosis of the
visualized portion of the aorta. Aberrant right subclavian artery.
The visualized proximal subclavian arteries are widely patent.

RIGHT CAROTID SYSTEM: Normal without aneurysm, dissection or
stenosis.

LEFT CAROTID SYSTEM: Normal without aneurysm, dissection or
stenosis.

VERTEBRAL ARTERIES: Left dominant configuration. Both origins are
clearly patent. There is no dissection, occlusion or flow-limiting
stenosis to the skull base (V1-V3 segments).

CTA HEAD FINDINGS

POSTERIOR CIRCULATION:

--Vertebral arteries: Normal V4 segments.

--Inferior cerebellar arteries: Normal.

--Basilar artery: Normal.

--Superior cerebellar arteries: Normal.

--Posterior cerebral arteries (PCA): Mild bilateral multifocal
stenoses are unchanged.

ANTERIOR CIRCULATION:

--Intracranial internal carotid arteries: Normal.

--Anterior cerebral arteries (ACA): Unchanged area of severe
stenosis of the left A2 segment

--Middle cerebral arteries (MCA): There are multifocal severe
bilateral stenoses, unchanged. Stenosis are most severe at the
distal M1 and proximal M2 segments.

VENOUS SINUSES: As permitted by contrast timing, patent.

ANATOMIC VARIANTS: None

Review of the MIP images confirms the above findings.
IMPRESSION: 1. No emergent large vessel occlusion.
2. Unchanged multifocal severe intracranial stenoses, including
severe stenosis of the left anterior cerebral artery A2 segment and
severe bilateral MCA distal M1 and proximal M2 stenoses.
3. Old left cerebellar and posterior left parietal infarcts.
4. Aberrant right subclavian artery.

## 2020-06-15 MED ORDER — LORAZEPAM 2 MG/ML IJ SOLN
1.0000 mg | INTRAMUSCULAR | Status: DC | PRN
  Administered 2020-06-16: 2 mg via INTRAVENOUS
  Administered 2020-06-16 (×2): 1 mg via INTRAVENOUS
  Filled 2020-06-15 (×3): qty 1

## 2020-06-15 MED ORDER — IOHEXOL 350 MG/ML SOLN
75.0000 mL | Freq: Once | INTRAVENOUS | Status: AC | PRN
  Administered 2020-06-15: 75 mL via INTRAVENOUS

## 2020-06-15 MED ORDER — METFORMIN HCL ER 500 MG PO TB24: ORAL_TABLET | ORAL | 0 refills | Status: DC

## 2020-06-15 MED ORDER — ACETAMINOPHEN 650 MG RE SUPP: 650.0000 mg | RECTAL | Status: DC | PRN

## 2020-06-15 MED ORDER — ASPIRIN 325 MG PO TABS
325.0000 mg | ORAL_TABLET | Freq: Every day | ORAL | Status: DC
  Administered 2020-06-17: 325 mg via ORAL
  Filled 2020-06-15: qty 1

## 2020-06-15 MED ORDER — ASPIRIN 300 MG RE SUPP
300.0000 mg | Freq: Every day | RECTAL | Status: DC
  Administered 2020-06-16: 300 mg via RECTAL
  Filled 2020-06-15 (×2): qty 1

## 2020-06-15 MED ORDER — ACETAMINOPHEN 160 MG/5ML PO SOLN: 650.0000 mg | ORAL | Status: DC | PRN

## 2020-06-15 MED ORDER — ATORVASTATIN CALCIUM 80 MG PO TABS
ORAL_TABLET | ORAL | 0 refills | Status: DC
  Filled 2020-06-15: qty 30, 30d supply, fill #0

## 2020-06-15 MED ORDER — INSULIN DETEMIR 100 UNIT/ML ~~LOC~~ SOLN
10.0000 [IU] | Freq: Every day | SUBCUTANEOUS | Status: DC
  Administered 2020-06-16 – 2020-06-22 (×6): 10 [IU] via SUBCUTANEOUS
  Filled 2020-06-15 (×9): qty 0.1

## 2020-06-15 MED ORDER — ACETAMINOPHEN 325 MG PO TABS
650.0000 mg | ORAL_TABLET | ORAL | Status: DC | PRN
  Administered 2020-06-18: 650 mg via ORAL
  Filled 2020-06-15: qty 2

## 2020-06-15 MED ORDER — ENOXAPARIN SODIUM 40 MG/0.4ML IJ SOSY
40.0000 mg | PREFILLED_SYRINGE | INTRAMUSCULAR | Status: DC
  Administered 2020-06-16 – 2020-06-23 (×8): 40 mg via SUBCUTANEOUS
  Filled 2020-06-15 (×8): qty 0.4

## 2020-06-15 MED ORDER — LEVETIRACETAM IN NACL 500 MG/100ML IV SOLN
500.0000 mg | Freq: Two times a day (BID) | INTRAVENOUS | Status: DC
  Administered 2020-06-16 – 2020-06-17 (×4): 500 mg via INTRAVENOUS
  Filled 2020-06-15 (×5): qty 100

## 2020-06-15 MED ORDER — STROKE: EARLY STAGES OF RECOVERY BOOK: Freq: Once | Status: AC

## 2020-06-15 MED ORDER — LEVEMIR FLEXTOUCH 100 UNIT/ML ~~LOC~~ SOPN: PEN_INJECTOR | SUBCUTANEOUS | 0 refills | Status: DC

## 2020-06-15 MED ORDER — LEVETIRACETAM 500 MG PO TABS: ORAL_TABLET | ORAL | 0 refills | Status: DC

## 2020-06-15 MED ORDER — INSULIN ASPART 100 UNIT/ML IJ SOLN
0.0000 [IU] | INTRAMUSCULAR | Status: DC
  Administered 2020-06-17: 1 [IU] via SUBCUTANEOUS
  Administered 2020-06-18: 2 [IU] via SUBCUTANEOUS

## 2020-06-15 MED ORDER — CLOPIDOGREL BISULFATE 75 MG PO TABS
ORAL_TABLET | ORAL | 0 refills | Status: DC
  Filled 2020-06-15: qty 30, 30d supply, fill #0

## 2020-06-15 NOTE — H&P (Signed)
History and Physical    Nathaniel Spears MBW:[id number] DOB: April 15, 1975 DOA: 06/15/2020  PCP: Kerin Perna, NP  Patient coming from: Home  I have personally briefly reviewed patient's old medical records in [place]  Chief Complaint: Aphasia  HPI: Nathaniel Spears is a 45 y.o. male with medical history significant of HTN, DM2, HLD, multiple recent admits for repeated strokes.  Pt most recently admitted 5/20-5/23 for acute strokes before being transferred at family request to [facility] for further work up.  Work up for CNS vasculitis apparently done at [employer] [employer] but they didn't feel he had CNS vasculitis.  Baseline: R>L sided weakness, has receptive and expressive aphasia.  Pt returns to ED today with c/o aphasia.  LKW 1830.  Verbally unresponsive for 30 mins after which pt returned to baseline expressive and receptive aphasia.   ED Course: While in ED he again had worsened aphasia compared to baseline.  Unfortunately due to severe aphasia, pt unable to contribute to history or perform ROS.  CTA head and neck again shows severe multivessel CVD.   Review of Systems: Unable to perform due to aphasia.  Past Medical History:  Diagnosis Date  . Hypertension   . Stroke [facility])     Past Surgical History:  Procedure Laterality Date  . BUBBLE STUDY  06/01/2020   Procedure: BUBBLE STUDY;  Surgeon: Donato Heinz, MD;  Location: [facility] ENDOSCOPY;  Service: Cardiovascular;;  . IR ANGIO INTRA EXTRACRAN SEL COM CAROTID INNOMINATE BILAT MOD SED  06/02/2020  . IR ANGIO VERTEBRAL SEL SUBCLAVIAN INNOMINATE BILAT MOD SED  06/02/2020  . TEE WITHOUT CARDIOVERSION N/A 06/01/2020   Procedure: TRANSESOPHAGEAL ECHOCARDIOGRAM (TEE);  Surgeon: Donato Heinz, MD;  Location: [facility] ENDOSCOPY;  Service: Cardiovascular;  Laterality: N/A;     reports that he has quit smoking. He has never used smokeless tobacco. He reports previous alcohol use. He reports that he does not use drugs.  No Known  Allergies  Family History  Problem Relation Age of Onset  . Stroke Neg Hx      Prior to Admission medications   Medication Sig Start Date End Date Taking? Authorizing Provider  acetaminophen (TYLENOL) 325 MG tablet Take 2 tablets (650 mg total) by mouth every 4 (four) hours as needed for mild pain (or temp > 37.5 C (99.5 F)). 06/03/20  Yes Sheikh, Omair Latif, DO  aspirin 325 MG EC tablet Take 1 tablet (325 mg total) by mouth daily. 06/03/20  Yes Sheikh, Omair Latif, DO  atorvastatin (LIPITOR) 40 MG tablet Take 2 tablets (80 mg total) by mouth daily. 06/06/20  Yes Aline August, MD  blood glucose meter kit and supplies Dispense based on patient and insurance preference. Use up to four times daily as directed. (FOR ICD-10 E10.9, E11.9). Patient taking differently: Dispense based on patient and insurance preference. Qd (FOR ICD-10 E10.9, E11.9). 04/23/20  Yes Arrien, Jimmy Picket, MD  Blood Glucose Monitoring Suppl (TRUE METRIX METER) w/Device KIT Use to check blood sugar TID. Patient taking differently: Use to check blood sugar qd 05/18/20  Yes Charlott Rakes, MD  clopidogrel (PLAVIX) 75 MG tablet Take 1 tablet (75 mg total) by mouth daily. 05/24/20 06/23/20 Yes Edwards, Milford Cage, NP  glucose blood (TRUE METRIX BLOOD GLUCOSE TEST) test strip Use to check blood sugar TID. Patient taking differently: Use to check blood sugar qd 05/18/20  Yes Newlin, Enobong, MD  insulin detemir (LEVEMIR FLEXTOUCH) 100 UNIT/ML FlexPen Inject 20 Units under the skin nightly for 30 days. Patient taking  differently: Inject 20 Units into the skin daily. 06/15/20  Yes   levETIRAcetam (KEPPRA) 500 MG tablet Take 1 tablet (500 mg total) by mouth 2 (two) times a day for 30 days. Home med 06/15/20  Yes   metFORMIN (GLUCOPHAGE) 1000 MG tablet Take 1,000 mg by mouth 2 (two) times daily with a meal.   Yes [provider]  TRUEplus Lancets 28G MISC Use to check blood sugar 3 times daily. Patient taking differently: Use to  check blood sugar qd 05/23/20  Yes Kerin Perna, NP  atorvastatin (LIPITOR) 80 MG tablet Take 1 tablet (80 mg total) by mouth nightly for 30 days. 06/15/20     clopidogrel (PLAVIX) 75 MG tablet Take 1 tablet (75 mg total) by mouth daily for 30 days. 06/15/20     Insulin Glargine (BASAGLAR KWIKPEN) 100 UNIT/ML Inject 15 Units into the skin daily. Patient not taking: No sig reported 06/06/20   Aline August, MD  metFORMIN (GLUCOPHAGE-XR) 500 MG 24 hr tablet Take 2 tablets (1,000 mg total) by mouth 2 (two) times a day with meals for 30 days. Patient not taking: No sig reported 06/15/20     methylPREDNISolone sodium succinate 1,000 mg in sodium chloride 0.9 % 50 mL Inject 1,000 mg into the vein daily. Patient not taking: No sig reported 06/07/20   Aline August, MD    Physical Exam: Vitals:   06/15/20 2215 06/15/20 2230 06/15/20 2245 06/15/20 2300  BP: 119/90 (!) 133/95 (!) 120/94 (!) 148/99  Pulse: 82 79 74 83  Resp: _0 Temp:      TempSrc:      SpO2: 98% 99% 98% 98%  Weight:      Height:        Constitutional: Awake and alert, aphasic. Eyes: PERRL ENMT: Mucous membranes are moist. Posterior pharynx clear of any exudate or lesions.Normal dentition.  Neck: normal, supple, no masses, no thyromegaly Respiratory: clear to auscultation bilaterally, no wheezing, no crackles. Normal respiratory effort. No accessory muscle use.  Cardiovascular: Regular rate and rhythm, no murmurs / rubs / gallops. No extremity edema. 2+ pedal pulses. No carotid bruits.  Abdomen: no tenderness, no masses palpated. No hepatosplenomegaly. Bowel sounds positive.  Musculoskeletal: no clubbing / cyanosis. No joint deformity upper and lower extremities. Good ROM, no contractures. Normal muscle tone.  Skin: no rashes, lesions, ulcers. No induration Neurologic: Severe expressive and receptive aphasia.  Can follow about 30% of commands, name 30% objects.  Apparent L Homonymous hemianopsia.  4/5 R and  L. Psychiatric: Frustrated with the aphasia.   Labs on Admission: I have personally reviewed following labs and imaging studies  CBC: Recent Labs  Lab 06/15/20 2052 06/15/20 2058  WBC 8.9  --   NEUTROABS 4.2  --   HGB 12.9* 13.6  HCT 40.1 40.0  MCV 80.4  --   PLT 334  --    Basic Metabolic Panel: Recent Labs  Lab 06/15/20 2052 06/15/20 2058  NA 138 139  K 3.4* 3.4*  CL 99 98  CO2 29  --   GLUCOSE 109* 108*  BUN 18 20  CREATININE 1.43* 1.50*  CALCIUM 9.1  --    GFR: Estimated Creatinine Clearance: 81.5 mL/min (A) (by C-G formula based on SCr of 1.5 mg/dL (H)). Liver Function Tests: Recent Labs  Lab 06/15/20 2052  AST 12*  ALT 15  ALKPHOS 57  BILITOT 0.8  PROT 6.7  ALBUMIN 3.5   No results for input(s): LIPASE, AMYLASE in  the last 168 hours. No results for input(s): AMMONIA in the last 168 hours. Coagulation Profile: Recent Labs  Lab 06/15/20 2052  INR 1.1   Cardiac Enzymes: No results for input(s): CKTOTAL, CKMB, CKMBINDEX, TROPONINI in the last 168 hours. BNP (last 3 results) No results for input(s): PROBNP in the last 8760 hours. HbA1C: No results for input(s): HGBA1C in the last 72 hours. CBG: Recent Labs  Lab 06/15/20 2007  GLUCAP 101*   Lipid Profile: No results for input(s): CHOL, HDL, LDLCALC, TRIG, CHOLHDL, LDLDIRECT in the last 72 hours. Thyroid Function Tests: No results for input(s): TSH, T4TOTAL, FREET4, T3FREE, THYROIDAB in the last 72 hours. Anemia Panel: No results for input(s): VITAMINB12, FOLATE, FERRITIN, TIBC, IRON, RETICCTPCT in the last 72 hours. Urine analysis:    Component Value Date/Time   COLORURINE YELLOW 06/05/2020 2220   APPEARANCEUR HAZY (A) 06/05/2020 2220   LABSPEC 1.028 06/05/2020 2220   PHURINE 5.0 06/05/2020 2220   GLUCOSEU 150 (A) 06/05/2020 2220   HGBUR NEGATIVE 06/05/2020 2220   BILIRUBINUR NEGATIVE 06/05/2020 2220   KETONESUR NEGATIVE 06/05/2020 2220   PROTEINUR NEGATIVE 06/05/2020 2220   NITRITE  NEGATIVE 06/05/2020 2220   LEUKOCYTESUR NEGATIVE 06/05/2020 2220    Radiological Exams on Admission: CT ANGIO NECK W OR WO CONTRAST  Result Date: 06/15/2020 CLINICAL DATA:  Right-sided deficits and aphasia EXAM: CT ANGIOGRAPHY HEAD AND NECK TECHNIQUE: Multidetector CT imaging of the head and neck was performed using the standard protocol during bolus administration of intravenous contrast. Multiplanar CT image reconstructions and MIPs were obtained to evaluate the vascular anatomy. Carotid stenosis measurements (when applicable) are obtained utilizing NASCET criteria, using the distal internal carotid diameter as the denominator. CONTRAST:  8m OMNIPAQUE IOHEXOL 350 MG/ML SOLN COMPARISON:  CTA head neck 05/31/2020 FINDINGS: CT HEAD FINDINGS Brain: Old infarcts of the left cerebellum and posterior left parietal lobe. No acute hemorrhage. The size and configuration of the ventricles and extra-axial CSF spaces are normal. There is hypoattenuation of the periventricular white matter, most commonly indicating chronic ischemic microangiopathy. Skull: The visualized skull base, calvarium and extracranial soft tissues are normal. Sinuses/Orbits: No fluid levels or advanced mucosal thickening of the visualized paranasal sinuses. No mastoid or middle ear effusion. The orbits are normal. CTA NECK FINDINGS SKELETON: There is no bony spinal canal stenosis. No lytic or blastic lesion. OTHER NECK: Normal pharynx, larynx and major salivary glands. No cervical lymphadenopathy. Unremarkable thyroid gland. UPPER CHEST: No pneumothorax or pleural effusion. No nodules or masses. AORTIC ARCH: There is no calcific atherosclerosis of the aortic arch. There is no aneurysm, dissection or hemodynamically significant stenosis of the visualized portion of the aorta. Aberrant right subclavian artery. The visualized proximal subclavian arteries are widely patent. RIGHT CAROTID SYSTEM: Normal without aneurysm, dissection or stenosis. LEFT  CAROTID SYSTEM: Normal without aneurysm, dissection or stenosis. VERTEBRAL ARTERIES: Left dominant configuration. Both origins are clearly patent. There is no dissection, occlusion or flow-limiting stenosis to the skull base (V1-V3 segments). CTA HEAD FINDINGS POSTERIOR CIRCULATION: --Vertebral arteries: Normal V4 segments. --Inferior cerebellar arteries: Normal. --Basilar artery: Normal. --Superior cerebellar arteries: Normal. --Posterior cerebral arteries (PCA): Mild bilateral multifocal stenoses are unchanged. ANTERIOR CIRCULATION: --Intracranial internal carotid arteries: Normal. --Anterior cerebral arteries (ACA): Unchanged area of severe stenosis of the left A2 segment --Middle cerebral arteries (MCA): There are multifocal severe bilateral stenoses, unchanged. Stenosis are most severe at the distal M1 and proximal M2 segments. VENOUS SINUSES: As permitted by contrast timing, patent. ANATOMIC VARIANTS: None Review of the MIP  images confirms the above findings. IMPRESSION: 1. No emergent large vessel occlusion. 2. Unchanged multifocal severe intracranial stenoses, including severe stenosis of the left anterior cerebral artery A2 segment and severe bilateral MCA distal M1 and proximal M2 stenoses. 3. 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 4. Aberrant right subclavian artery. Electronically Signed   By: Ulyses Jarred M.D.   On: 06/15/2020 21:42   CT ANGIO HEAD CODE STROKE  Result Date: 06/15/2020 CLINICAL DATA:  Right-sided deficits and aphasia EXAM: CT ANGIOGRAPHY HEAD AND NECK TECHNIQUE: Multidetector CT imaging of the head and neck was performed using the standard protocol during bolus administration of intravenous contrast. Multiplanar CT image reconstructions and MIPs were obtained to evaluate the vascular anatomy. Carotid stenosis measurements (when applicable) are obtained utilizing NASCET criteria, using the distal internal carotid diameter as the denominator. CONTRAST:  74m  OMNIPAQUE IOHEXOL 350 MG/ML SOLN COMPARISON:  CTA head neck 05/31/2020 FINDINGS: CT HEAD FINDINGS Brain: Old infarcts of the left cerebellum and posterior left parietal lobe. No acute hemorrhage. The size and configuration of the ventricles and extra-axial CSF spaces are normal. There is hypoattenuation of the periventricular white matter, most commonly indicating chronic ischemic microangiopathy. Skull: The visualized skull base, calvarium and extracranial soft tissues are normal. Sinuses/Orbits: No fluid levels or advanced mucosal thickening of the visualized paranasal sinuses. No mastoid or middle ear effusion. The orbits are normal. CTA NECK FINDINGS SKELETON: There is no bony spinal canal stenosis. No lytic or blastic lesion. OTHER NECK: Normal pharynx, larynx and major salivary glands. No cervical lymphadenopathy. Unremarkable thyroid gland. UPPER CHEST: No pneumothorax or pleural effusion. No nodules or masses. AORTIC ARCH: There is no calcific atherosclerosis of the aortic arch. There is no aneurysm, dissection or hemodynamically significant stenosis of the visualized portion of the aorta. Aberrant right subclavian artery. The visualized proximal subclavian arteries are widely patent. RIGHT CAROTID SYSTEM: Normal without aneurysm, dissection or stenosis. LEFT CAROTID SYSTEM: Normal without aneurysm, dissection or stenosis. VERTEBRAL ARTERIES: Left dominant configuration. Both origins are clearly patent. There is no dissection, occlusion or flow-limiting stenosis to the skull base (V1-V3 segments). CTA HEAD FINDINGS POSTERIOR CIRCULATION: --Vertebral arteries: Normal V4 segments. --Inferior cerebellar arteries: Normal. --Basilar artery: Normal. --Superior cerebellar arteries: Normal. --Posterior cerebral arteries (PCA): Mild bilateral multifocal stenoses are unchanged. ANTERIOR CIRCULATION: --Intracranial internal carotid arteries: Normal. --Anterior cerebral arteries (ACA): Unchanged area of severe stenosis  of the left A2 segment --Middle cerebral arteries (MCA): There are multifocal severe bilateral stenoses, unchanged. Stenosis are most severe at the distal M1 and proximal M2 segments. VENOUS SINUSES: As permitted by contrast timing, patent. ANATOMIC VARIANTS: None Review of the MIP images confirms the above findings. IMPRESSION: 1. No emergent large vessel occlusion. 2. Unchanged multifocal severe intracranial stenoses, including severe stenosis of the left anterior cerebral artery A2 segment and severe bilateral MCA distal M1 and proximal M2 stenoses. 3. 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 4. Aberrant right subclavian artery. Electronically Signed   By: KUlyses JarredM.D.   On: 06/15/2020 21:42    EKG: Independently reviewed.  Assessment/Plan Principal Problem:   Acute ischemic stroke ([facility] Active Problems:   Essential hypertension   Type 2 diabetes mellitus with hyperglycemia, with long-term current use of insulin (HCC)    1. Recurrent Acute ischemic stroke - 1. Unfortunately patient continues to have recurrent acute strokes as demonstrated on multiple serial imaging studies over the past weeks, thus today's presentation is probably further acute stroke(s). 2. MRI pending to confirm  this. 3. Not felt to have CNS vasculitis after work up at [facility] just this past week. 4. Has severe advanced CVD on CTA head and neck (especially for age). 5. Continue ASA for the moment 6. Presumably needs swallow eval / SLP before taking POs 1. Ordering maint IVF. 7. PT/OT/SLP 8. See Dr. Cheral Marker consult note for further recs. 2. HTN - 1. Hold home BP meds, allow permissive HTN 3. DM2 - 1. Will do levemir 10u QHS for the moment 2. And SSI sensitive scale Q4H while NPO awaiting swallow eval.  DVT prophylaxis: Lovenox Code Status: Full Family Communication: Family at bedside Disposition Plan: TBD Consults called: Dr. Cheral Marker Admission status: Admit to inpatient  Severity of  Illness: The appropriate patient status for this patient is INPATIENT. Inpatient status is judged to be reasonable and necessary in order to provide the required intensity of service to ensure the patient's safety. The patient's presenting symptoms, physical exam findings, and initial radiographic and laboratory data in the context of their chronic comorbidities is felt to place them at high risk for further clinical deterioration. Furthermore, it is not anticipated that the patient will be medically stable for discharge from the hospital within 2 midnights of admission. The following factors support the patient status of inpatient.   IP status for ongoing and recurrent acute ischemic strokes.  This time with worsening of severe aphasia.  * I certify that at the point of admission it is my clinical judgment that the patient will require inpatient hospital care spanning beyond 2 midnights from the point of admission due to high intensity of service, high risk for further deterioration and high frequency of surveillance required.*    GARDNER, JARED M. DO [facility]  How to contact the [facility] Attending or Consulting provider [place] or covering provider during after hours [place], for this patient?  1. Check the care team in [facility] and look for a) attending/consulting [facility] provider listed and b) the [facility] team listed 2. Log into [email or web address]  Amion Physician Scheduling and messaging for groups and whole hospitals  On call and physician scheduling software for group practices, residents, hospitalists and other medical providers for call, clinic, rotation and shift schedules. On[employer] is a hospital-wide system for scheduling doctors and paging doctors on call. EasyPlot is for scientific plotting and data analysis.  [email or web address]  and use [place]'s universal password to access. If you do not have the password, please contact the hospital operator.  3. Locate the [facility] provider you are looking  for under [facility] and page to a number that you can be directly reached. 4. If you still have difficulty reaching the provider, please page the [facility] (Director on Call) for the Hospitalists listed on amion for assistance.  06/15/2020, 11:13 PM

## 2020-06-15 NOTE — Consult Note (Signed)
NEURO HOSPITALIST CONSULT NOTE   Requestig physician: Dr. Alvino Chapel  Reason for Consult: Aphasia  History obtained from:  Chart     HPI:                                                                                                                                          Nathaniel Spears is a 45 y.o. male with HTN, DM2, HLD and several recent admissions for recurrent strokes, recent work up for possible CNS vasculitis, baseline right > left sided weakness, receptive and expressive dysphasia (able to name 50% of common objects presented to him, decreased fluency, answers many questions with yes/no answers) who re-presents to the ED this evening from home after having an acute episode of awake unresponsiveness at home. LKN was 1830. The episode of verbal responsiveness lasted for about 30 minutes, after which the patient returned to his baseline of expressive and receptive dysphasia. He was brought to the ED where on initial exam by EDP, per EDP Dr. Alvino Chapel, he again had worsened aphasia relative to his baseline, in addition to known asymmetric weakness on initial exam in the ED. Neurology was called for assessment. His condition then improved briefly for a few minutes followed by worsening again just prior to arrival of Neurology. At the time of assessment by Neurologist, he had severe expressive and receptive aphasia, able to follow only about 30% of commands, and able to correctly name about 30% of common objects presented to him. He also had left > right weakness with less brisk responses to noxious stimuli on the left relative to the right.   Code Stroke was called. STAT CT head showed no acute abnormality, with multifocal chronic infarctions noted. CTA of head and neck was unchanged, with multifocal intracranial stenoses noted.   CTA of head and neck from 5/16: - No hemodynamically significant stenosis in the major neck arteries. - Prominent luminal irregularity of the  intracranial vasculature, may represent advanced atherosclerotic disease versus vasculitis. - New occlusion versus high-grade stenosis at the left A2/ACA segment. - Multifocal areas of high-grade stenosis in the bilateral MCA vascular trees. There is interval worsening of bilateral multifocal areas of stenosis of the bilateral MCA vascular tree preserving the bilateral M1 segments with severe stenosis at the proximal left M2 segment and persistent occlusion at the distal left M3/MCA superior division branch. Severe stenosis at the proximal right M2/MCA superior division branch and M3/MCA posterior division branches. with persistent occlusion of the distal left M3/MCA superior division branch and worsening stenosis of right M3/M branches.  Catheter-based cerebral angiogram from 5/18: 1.Occluded Lt . MCA  Sup division in mid to distal M2 seg. 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 3. Mod severe focal  stenosis of Lt MCA inf division  parietal branch. 4. Focal stenosis of Lt ACA prox pericallosal branches  MRI brain from 5/20: Bilateral recent infarcts in the anterior and middle cerebral artery territories similar to the recent MRI. Small new areas of restricted diffusion in the right frontal cortex and in the right parietal white matter and cortex compatible with interval infarct since 05/30/2020. In addition, there is a new area of restricted diffusion in the left anterior cerebellum. Following contrast infusion, there is mild enhancement of several subacute infarcts and occluding the left anterior genu of the corpus callosum, left corona radiata, and left parietal lobe compatible with subacute infarct. Ventricle size normal. 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 This shows a slight amount of enhancement however there is volume loss in the majority of this appears to be a chronic infarct. Small area of chronic hemorrhagic infarct in the right temporoparietal  lobe. Moderate chronic microvascular ischemic change in the white matter. Chronic left cerebellar infarct. Negative for mass lesion.  His LP here at [facility] on 5/20 was bland, with normal WBC and protein, as well as normal CSF IgG index. CSF OCBs were negative. VDRL was negative. His CRP was elevated at 5.3 and ESR was 57. Hypercoagulable panel was unremarkable. Tox screen on 5/21 was negative. VZV IgM on 5/20 was negative. CSF VZV PCR could not be performed due to insufficient sample. Lupus panel was negtive. Copmlement levels were not decreased, but C3 complement level was elevated at 203. Proetein C and S were normal. PT and PTT were normal. Lupus anticoagulant screen was negative. DRVVT normal. Beta2 glycoprotein Abs were normal. Vitamin B12 normal. Homocysteine normal. Ferritin normal. Cholesterol panel negative except for decreased HDL.   He was treated with IV Solumedrol empirically, 1000 mg IV qd, starting on 5/20 for a 5 day course.   He was transferred to [place] on 5/22 after initial work up here at [facility]. Per notes in [place] [facility] [place], the patient was felt not to have a vasculitis.  Discharge summary from 5/22 has been reviewed: "45 year old male with history of diabetes mellitus type 2, hyperlipidemia, hypertension, cocaine abuse, obesity, nicotine dependence and multiple recent strokes requiring hospitalization presented on 06/04/2020 with right lower extremity weakness and fall, 24 hours after discharge from [facility] for recently diagnosed stroke. Of note, patient was hospitalized at [facility] from 4/5 until 04/23/2020 after presenting with aphasia. Patient was identified to be suffering from an acute stroke of the left middle frontal lobe. Patient was initiated on dual antiplatelet therapy and eventually discharged on 4/8. Patient then presented again to [facility] on 5/15 with symptoms of left lower extremity weakness, dizziness falling and recurrent  aphasia. On this presentation a repeat MRI was obtained demonstrating ischemia in the [place] distribution as well as his previous MCA stroke. Patient was eventually discharged from [facility] on 5/19. Shortly after discharge on 5/19 patient presented via EMS back to [facility] emergency department after the family noticed patient was having difficulty with speech and acting strangely. On that evaluation, case was reviewed with neurology and no further imaging was recommended. Initiation of Keppra for seizure prophylaxis was recommended however and patient was discharged home. On 5/20, patient was with family at home in the early afternoon when he suddenly fellwhile walking. This was a witnessed fall by the patient's girlfriend. Patient once again began to exhibit difficulty with speech. Patient was then brought into [facility] emergency department.  On presentation, patient was evaluated  by teleneurology. A stat CT angiogram of the head and neck was obtained as well as cerebral perfusion imaging and noncontrast CT imaging of the head. Imaging revealed that many of the areas of cerebrovascular disease were unchanged however it also identified several areas of progressive disease.Neurology recommended hospitalization for repeat MRI and additional work-up. They also recommended a vasculitis panel and lumbar puncture. A lumbar puncture was performed in the emergency department as recommended. Patient was to be admitted to [facility] however due to the patient and family's dissatisfaction with [facility] they instead requested that the patient be transferred to [facility] for further care. After the ER provider attempted this and it was unsuccessful, family then agreed for patient to be admitted to [facility] for further management instead of going to [facility]. This was approved by Dr. Cheral Marker with neurology. Family has continued to request transfer to [facility].  He will be  transferred to [place] once bed is available." Summary of discharge diagnoses was as follows: "Acute recurrent ischemic strokes -Presented with intermittent expressive and receptive aphasia with associated left-sided weakness and fall. -Apparently he is compliant with his home aspirin, Plavix and statin therapy and now abstinent of cocaine use and has quit smoking. -Noncontrast CT head with CTA and perfusion imaging here in the emergency department concerning for progressive cerebrovascular process with possible acute or subacute strokes-overall processes concerning for possible underlying vasculitis/autoimmune disease or hypercoagulable state. -MRI of brain shows multiple areas of subacute infarct as noted in the prior MRI of 05/30/2020 along with several small areas of acute infarct have developed since the prior MRI. Patient initially refused admission to [facility] as recommended by teleneurology and wanted to be transferred to [facility] which was attempted by ER provider without success. Eventually patient was admitted to [facility]. -Neurology team will see the patient once patient gets transferred to [facility]. Family still does not want the patient to be transferred to [facility] and still requesting that patient be transferred to [facility]. -Status post LP in the ED as perteleneurology recommendations and currently on high-dose Solu-Medrol as well. Send out labs from LP are pending -Continue aspirin, statin, Plavix and Keppra. PT/OT recommending CIR placement.  Diet as per SLP recommendations -He will be transferred to [place] once bed is available."  His current home medications include ASA, Plavix and atorvastatin. He is also on Keppra 500 mg BID. He formerly used cocaine but has been abstinent.   Past Medical History:  Diagnosis Date  . Hypertension   . Stroke [facility])     Past Surgical History:  Procedure Laterality Date   . BUBBLE STUDY  06/01/2020   Procedure: BUBBLE STUDY;  Surgeon: Donato Heinz, MD;  Location: [facility] ENDOSCOPY;  Service: Cardiovascular;;  . IR ANGIO INTRA EXTRACRAN SEL COM CAROTID INNOMINATE BILAT MOD SED  06/02/2020  . IR ANGIO VERTEBRAL SEL SUBCLAVIAN INNOMINATE BILAT MOD SED  06/02/2020  . TEE WITHOUT CARDIOVERSION N/A 06/01/2020   Procedure: TRANSESOPHAGEAL ECHOCARDIOGRAM (TEE);  Surgeon: Donato Heinz, MD;  Location: [facility] ENDOSCOPY;  Service: Cardiovascular;  Laterality: N/A;    Family History  Problem Relation Age of Onset  . Stroke Neg Hx               Social History:  reports that he has quit smoking. He has never used smokeless tobacco. He reports previous alcohol use. He reports that he does not use drugs.  No Known Allergies  HOME  MEDICATIONS:                                                                                                                     No current facility-administered medications on file prior to encounter.   Current Outpatient Medications on File Prior to Encounter  Medication Sig Dispense Refill  . acetaminophen (TYLENOL) 325 MG tablet Take 2 tablets (650 mg total) by mouth every 4 (four) hours as needed for mild pain (or temp > 37.5 C (99.5 F)). 20 tablet 0  . aspirin 325 MG EC tablet Take 1 tablet (325 mg total) by mouth daily. 30 tablet 2  . atorvastatin (LIPITOR) 40 MG tablet Take 2 tablets (80 mg total) by mouth daily.    Marland Kitchen atorvastatin (LIPITOR) 80 MG tablet Take 1 tablet (80 mg total) by mouth nightly for 30 days. 30 tablet 0  . blood glucose meter kit and supplies Dispense based on patient and insurance preference. Use up to four times daily as directed. (FOR ICD-10 E10.9, E11.9). 1 each 0  . Blood Glucose Monitoring Suppl (TRUE METRIX METER) w/Device KIT Use to check blood sugar TID. 1 kit 0  . clopidogrel (PLAVIX) 75 MG tablet Take 1 tablet (75 mg total) by mouth daily. 30 tablet 0  . clopidogrel (PLAVIX) 75 MG tablet Take 1  tablet (75 mg total) by mouth daily for 30 days. 30 tablet 0  . glucose blood (TRUE METRIX BLOOD GLUCOSE TEST) test strip Use to check blood sugar TID. 100 each 2  . insulin detemir (LEVEMIR FLEXTOUCH) 100 UNIT/ML FlexPen Inject 20 Units under the skin nightly for 30 days. 15 mL 0  . Insulin Glargine (BASAGLAR KWIKPEN) 100 UNIT/ML Inject 15 Units into the skin daily. 10 mL 2  . levETIRAcetam (KEPPRA) 500 MG tablet Take 1 tablet (500 mg total) by mouth 2 (two) times a day for 30 days. Home med 60 tablet 0  . metFORMIN (GLUCOPHAGE-XR) 500 MG 24 hr tablet Take 2 tablets (1,000 mg total) by mouth 2 (two) times a day with meals for 30 days. 120 tablet 0  . methylPREDNISolone sodium succinate 1,000 mg in sodium chloride 0.9 % 50 mL Inject 1,000 mg into the vein daily.    . TRUEplus Lancets 28G MISC Use to check blood sugar 3 times daily. (Patient taking differently: Use to check blood sugar 3 times daily.) 100 each 2      ROS:  Unable to obtain due to aphasia.    Blood pressure 122/88, pulse 93, temperature 98.2 F (36.8 C), temperature source Oral, resp. rate 19, SpO2 96 %.   General Examination:                                                                                                       Physical Exam  HEENT-  [place]/AT   Lungs- Respirations unlabored Extremities- No edema   Neurological Examination Mental Status: Awake and alert. Severe expressive and receptive aphasia. Able to follow only about 30% of commands, and able to correctly name about 30% of common objects presented to him. Unable to test orientation. Became tearful/frustrated at one point during testing of naming.  Cranial Nerves: II: Blinks to threat in right hemifield. No blink to threat in left hemifield. PERRL.   III,IV, VI: Can track to the left and right conjugately but with coarse  horizontal nystagmus during pursuits. Had difficulty following commands for testing of upgaze.  V: Grossly intact responses to tactile stimulation on left and right. Unable to formally assess sensation due to aphasia.  VII: At rest, left perioral muscles with decreased tone, but elevates left side of mouth more briskly than right when asked to smile.  VIII: Hearing intact to some questions and commands.  IX,X: Not following commands for assessment XI: Head is midline.  XII: Does not protrude tongue to command Motor/Sensory: LUE: Mildly increased flexor tone. Slowly drifts to bed after passive elevation and release. Will grip examiner's hand and resist movement somewhat, but unable to formally test due to comprehension deficit. Reacts to pinch after a delay.  RUE: Drifts more slowly to bed than left with passive elevation and release. More briskly reacts to pinch than on the left. More spontaneous movement as well as more brisk and purposeful movement on the right relative to the left.  LLE: Drifts slowly to bed after passive elevation and release. Flexes at knee and hip when withdrawing to noxious with 4-/5 strength. Reaction to noxious is brisk.  RLE: Drifts less slowly to bed than on left. Strength when withdrawing to noxious is 4/5. Reaction to noxious is brisker than on the left.  Deep Tendon Reflexes: Does not relax limbs sufficiently for definitive assessment.  Plantars: Equivocal bilaterally  Cerebellar: No gross ataxia with limited assessment. Patient unable to follow commands for detailed testing.  Gait: Deferred   Lab Results: Basic Metabolic Panel: No results for input(s): NA, K, CL, CO2, GLUCOSE, BUN, CREATININE, CALCIUM, MG, PHOS in the last 168 hours.  CBC: No results for input(s): WBC, NEUTROABS, HGB, HCT, MCV, PLT in the last 168 hours.  Cardiac Enzymes: No results for input(s): CKTOTAL, CKMB, CKMBINDEX, TROPONINI in the last 168 hours.  Lipid Panel: No results for  input(s): CHOL, TRIG, HDL, CHOLHDL, VLDL, LDLCALC in the last 168 hours.  Imaging: No results found.  Assessment: 45 year old male with multiple recent strokes re-presenting with fluctuating worsening of his aphasia.  1. CTA of head and neck: No emergent large vessel occlusion. Unchanged multifocal severe intracranial stenoses, including  severe stenosis of the left anterior cerebral artery A2 segment and severe bilateral MCA distal M1 and proximal M2 stenoses. 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 Aberrant right subclavian artery.  2. Exam reveals aphasia and asymmetric limb weakness.   3. DDx for recurrent stroke symptoms includes CNS vasculitis, but with negative LP, vasculitis labs and hypercoagulable panel. He did have increased ESR and CRP during previous evaluation.  4. Review of prior imaging is documented in the HPI.   Recommendations: 1. MRI brain 2. Repeat ESR and CRP 3. May need to consider a brain biopsy for pathology specimen to assess for possible CNS vasculitis.  4. Stroke Team to follow.   Electronically signed: Dr. Kerney Elbe 06/15/2020, 8:23 PM

## 2020-06-15 NOTE — ED Notes (Signed)
At time of arrival pt was only able to respond verbally with "yes" to all questions and provided name once. At this time pt able to answer using more words and able to follow more commands. Appears frustrated with difficulty with speech. Per MD neuro consulted.

## 2020-06-15 NOTE — ED Triage Notes (Addendum)
Pt arrived via [facility] for AMS, now back at baseline. Pt has history of stroke/tia, aphasia, right side deficits at baseline per wife. Wife reports at 1830 pt became altered, not responding to questions/following command. Episode lasted approximately 30 minutes. Pt can follow basic commands, only word spoken is "yeah," pt tearful.

## 2020-06-15 NOTE — ED Provider Notes (Signed)
[place] EMERGENCY DEPARTMENT Provider Note   CSN: [id number] Arrival date & time: 06/15/20  2005  An emergency department physician performed an initial assessment on this suspected stroke patient at 2048.  History Chief Complaint  Patient presents with  . Code Stroke  Level 5 caveat due to aphasia.  Nathaniel Spears is a 45 y.o. male.  HPI Patient presented with difficulty speaking.  History of multiple recent strokes.  Has been seen in the ER several times and admitted to the hospital with multiple different strokes.  Also had recent transfer to [facility] for further work-up and thought of a central vasculitis.  However they appear to think it was not a vasculitic cause.  Had another episode today.  At baseline difficulty speaking but was at home and developed unresponsiveness and decreased speech overall.  Last normal had been around 630.  I saw the patient and immediately discussed with Dr. Cheral Marker from neurology.  Came down and saw patient and we decided to call code stroke since did have new deficits compared to what appears to be his baseline by documentation.    Past Medical History:  Diagnosis Date  . Hypertension   . Stroke [facility])     Patient Active Problem List   Diagnosis Date Noted  . Acute ischemic stroke ([place]) 06/05/2020  . Acute left-sided weakness 06/04/2020  . Nicotine dependence 06/04/2020  . Cocaine use 06/04/2020  . Aphasia   . Polysubstance abuse ([place])   . Type 2 diabetes mellitus with hyperglycemia, with long-term current use of insulin ([place])   . Hypokalemia   . Acute CVA (cerebrovascular accident) ([place]) 04/20/2020  . Essential hypertension 08/21/2019  . Obesity 08/21/2019    Past Surgical History:  Procedure Laterality Date  . BUBBLE STUDY  06/01/2020   Procedure: BUBBLE STUDY;  Surgeon: Donato Heinz, MD;  Location: [facility] ENDOSCOPY;  Service: Cardiovascular;;  . IR ANGIO INTRA EXTRACRAN SEL COM CAROTID INNOMINATE BILAT MOD  SED  06/02/2020  . IR ANGIO VERTEBRAL SEL SUBCLAVIAN INNOMINATE BILAT MOD SED  06/02/2020  . TEE WITHOUT CARDIOVERSION N/A 06/01/2020   Procedure: TRANSESOPHAGEAL ECHOCARDIOGRAM (TEE);  Surgeon: Donato Heinz, MD;  Location: [facility] ENDOSCOPY;  Service: Cardiovascular;  Laterality: N/A;       Family History  Problem Relation Age of Onset  . Stroke Neg Hx     Social History   Tobacco Use  . Smoking status: Former Research scientist (life sciences)  . Smokeless tobacco: Never Used  Substance Use Topics  . Alcohol use: Not Currently  . Drug use: Never    Home Medications Prior to Admission medications   Medication Sig Start Date End Date Taking? Authorizing Provider  acetaminophen (TYLENOL) 325 MG tablet Take 2 tablets (650 mg total) by mouth every 4 (four) hours as needed for mild pain (or temp > 37.5 C (99.5 F)). 06/03/20  Yes Sheikh, Omair Latif, DO  aspirin 325 MG EC tablet Take 1 tablet (325 mg total) by mouth daily. 06/03/20  Yes Sheikh, Omair Latif, DO  atorvastatin (LIPITOR) 40 MG tablet Take 2 tablets (80 mg total) by mouth daily. 06/06/20  Yes Aline August, MD  blood glucose meter kit and supplies Dispense based on patient and insurance preference. Use up to four times daily as directed. (FOR ICD-10 E10.9, E11.9). Patient taking differently: Dispense based on patient and insurance preference. Qd (FOR ICD-10 E10.9, E11.9). 04/23/20  Yes Arrien, Jimmy Picket, MD  Blood Glucose Monitoring Suppl (TRUE METRIX METER) w/Device KIT Use to check  blood sugar TID. Patient taking differently: Use to check blood sugar qd 05/18/20  Yes Charlott Rakes, MD  clopidogrel (PLAVIX) 75 MG tablet Take 1 tablet (75 mg total) by mouth daily. 05/24/20 06/23/20 Yes Edwards, Milford Cage, NP  glucose blood (TRUE METRIX BLOOD GLUCOSE TEST) test strip Use to check blood sugar TID. Patient taking differently: Use to check blood sugar qd 05/18/20  Yes Newlin, Enobong, MD  insulin detemir (LEVEMIR FLEXTOUCH) 100 UNIT/ML FlexPen Inject 20  Units under the skin nightly for 30 days. Patient taking differently: Inject 20 Units into the skin daily. 06/15/20  Yes   levETIRAcetam (KEPPRA) 500 MG tablet Take 1 tablet (500 mg total) by mouth 2 (two) times a day for 30 days. Home med 06/15/20  Yes   metFORMIN (GLUCOPHAGE) 1000 MG tablet Take 1,000 mg by mouth 2 (two) times daily with a meal.   Yes [provider]  TRUEplus Lancets 28G MISC Use to check blood sugar 3 times daily. Patient taking differently: Use to check blood sugar qd 05/23/20  Yes Kerin Perna, NP  atorvastatin (LIPITOR) 80 MG tablet Take 1 tablet (80 mg total) by mouth nightly for 30 days. 06/15/20     clopidogrel (PLAVIX) 75 MG tablet Take 1 tablet (75 mg total) by mouth daily for 30 days. 06/15/20     Insulin Glargine (BASAGLAR KWIKPEN) 100 UNIT/ML Inject 15 Units into the skin daily. Patient not taking: No sig reported 06/06/20   Aline August, MD  metFORMIN (GLUCOPHAGE-XR) 500 MG 24 hr tablet Take 2 tablets (1,000 mg total) by mouth 2 (two) times a day with meals for 30 days. Patient not taking: No sig reported 06/15/20     methylPREDNISolone sodium succinate 1,000 mg in sodium chloride 0.9 % 50 mL Inject 1,000 mg into the vein daily. Patient not taking: No sig reported 06/07/20   Aline August, MD    Allergies    Patient has no known allergies.  Review of Systems   Review of Systems  Unable to perform ROS: Patient nonverbal    Physical Exam Updated Vital Signs BP (!) 148/99   Pulse 83   Temp 98.2 F (36.8 C) (Oral)   Resp 13   Ht 6' (1.829 m)   Wt 115 kg   SpO2 98%   BMI 34.38 kg/m   Physical Exam Vitals reviewed.  HENT:     Head: Atraumatic.  Eyes:     Pupils: Pupils are equal, round, and reactive to light.  Cardiovascular:     Rate and Rhythm: Regular rhythm.  Pulmonary:     Breath sounds: No rhonchi.  Abdominal:     Tenderness: There is no abdominal tenderness.  Musculoskeletal:        General: No tenderness.     Cervical  back: Neck supple.  Skin:    General: Skin is warm.     Capillary Refill: Capillary refill takes less than 2 seconds.  Neurological:     Mental Status: He is alert.     Comments: Varying amounts of expressive and receptive aphasia.  Sometimes can only get the word yes out.  Was only able to identify 3 of 10 common objects.  Later was able to give his name.  Later able to identify his wife.  Has waxed and waned even while in the ER.     ED Results / Procedures / Treatments   Labs (all labs ordered are listed, but only abnormal results are displayed) Labs Reviewed  CBC -  Abnormal; Notable for the following components:      Result Value   Hemoglobin 12.9 (*)    MCH 25.9 (*)    All other components within normal limits  COMPREHENSIVE METABOLIC PANEL - Abnormal; Notable for the following components:   Potassium 3.4 (*)    Glucose, Bld 109 (*)    Creatinine, Ser 1.43 (*)    AST 12 (*)    All other components within normal limits  CBG MONITORING, ED - Abnormal; Notable for the following components:   Glucose-Capillary 101 (*)    All other components within normal limits  I-STAT CHEM 8, ED - Abnormal; Notable for the following components:   Potassium 3.4 (*)    Creatinine, Ser 1.50 (*)    Glucose, Bld 108 (*)    Calcium, Ion 1.12 (*)    All other components within normal limits  RESP PANEL BY RT-PCR (FLU A&B, COVID) ARPGX2  ETHANOL  PROTIME-INR  APTT  DIFFERENTIAL  RAPID URINE DRUG SCREEN, HOSP PERFORMED  URINALYSIS, ROUTINE W REFLEX MICROSCOPIC    EKG EKG Interpretation  Date/Time:  Tuesday Jun 15 2020 20:47:12 EDT Ventricular Rate:  91 PR Interval:  150 QRS Duration: 105 QT Interval:  405 QTC Calculation: 499 R Axis:   16 Text Interpretation: Sinus rhythm Borderline prolonged QT interval Confirmed by Davonna Belling [phone]) on 06/15/2020 9:34:20 PM   Radiology CT ANGIO NECK W OR WO CONTRAST  Result Date: 06/15/2020 CLINICAL DATA:  Right-sided deficits and aphasia  EXAM: CT ANGIOGRAPHY HEAD AND NECK TECHNIQUE: Multidetector CT imaging of the head and neck was performed using the standard protocol during bolus administration of intravenous contrast. Multiplanar CT image reconstructions and MIPs were obtained to evaluate the vascular anatomy. Carotid stenosis measurements (when applicable) are obtained utilizing NASCET criteria, using the distal internal carotid diameter as the denominator. CONTRAST:  46m OMNIPAQUE IOHEXOL 350 MG/ML SOLN COMPARISON:  CTA head neck 05/31/2020 FINDINGS: CT HEAD FINDINGS Brain: Old infarcts of the left cerebellum and posterior left parietal lobe. No acute hemorrhage. The size and configuration of the ventricles and extra-axial CSF spaces are normal. There is hypoattenuation of the periventricular white matter, most commonly indicating chronic ischemic microangiopathy. Skull: The visualized skull base, calvarium and extracranial soft tissues are normal. Sinuses/Orbits: No fluid levels or advanced mucosal thickening of the visualized paranasal sinuses. No mastoid or middle ear effusion. The orbits are normal. CTA NECK FINDINGS SKELETON: There is no bony spinal canal stenosis. No lytic or blastic lesion. OTHER NECK: Normal pharynx, larynx and major salivary glands. No cervical lymphadenopathy. Unremarkable thyroid gland. UPPER CHEST: No pneumothorax or pleural effusion. No nodules or masses. AORTIC ARCH: There is no calcific atherosclerosis of the aortic arch. There is no aneurysm, dissection or hemodynamically significant stenosis of the visualized portion of the aorta. Aberrant right subclavian artery. The visualized proximal subclavian arteries are widely patent. RIGHT CAROTID SYSTEM: Normal without aneurysm, dissection or stenosis. LEFT CAROTID SYSTEM: Normal without aneurysm, dissection or stenosis. VERTEBRAL ARTERIES: Left dominant configuration. Both origins are clearly patent. There is no dissection, occlusion or flow-limiting stenosis to the  skull base (V1-V3 segments). CTA HEAD FINDINGS POSTERIOR CIRCULATION: --Vertebral arteries: Normal V4 segments. --Inferior cerebellar arteries: Normal. --Basilar artery: Normal. --Superior cerebellar arteries: Normal. --Posterior cerebral arteries (PCA): Mild bilateral multifocal stenoses are unchanged. ANTERIOR CIRCULATION: --Intracranial internal carotid arteries: Normal. --Anterior cerebral arteries (ACA): Unchanged area of severe stenosis of the left A2 segment --Middle cerebral arteries (MCA): There are multifocal severe bilateral stenoses, unchanged. Stenosis are  most severe at the distal M1 and proximal M2 segments. VENOUS SINUSES: As permitted by contrast timing, patent. ANATOMIC VARIANTS: None Review of the MIP images confirms the above findings. IMPRESSION: 1. No emergent large vessel occlusion. 2. Unchanged multifocal severe intracranial stenoses, including severe stenosis of the left anterior cerebral artery A2 segment and severe bilateral MCA distal M1 and proximal M2 stenoses. 3. 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 4. Aberrant right subclavian artery. Electronically Signed   By: Ulyses Jarred M.D.   On: 06/15/2020 21:42   CT ANGIO HEAD CODE STROKE  Result Date: 06/15/2020 CLINICAL DATA:  Right-sided deficits and aphasia EXAM: CT ANGIOGRAPHY HEAD AND NECK TECHNIQUE: Multidetector CT imaging of the head and neck was performed using the standard protocol during bolus administration of intravenous contrast. Multiplanar CT image reconstructions and MIPs were obtained to evaluate the vascular anatomy. Carotid stenosis measurements (when applicable) are obtained utilizing NASCET criteria, using the distal internal carotid diameter as the denominator. CONTRAST:  67m OMNIPAQUE IOHEXOL 350 MG/ML SOLN COMPARISON:  CTA head neck 05/31/2020 FINDINGS: CT HEAD FINDINGS Brain: Old infarcts of the left cerebellum and posterior left parietal lobe. No acute hemorrhage. The size and  configuration of the ventricles and extra-axial CSF spaces are normal. There is hypoattenuation of the periventricular white matter, most commonly indicating chronic ischemic microangiopathy. Skull: The visualized skull base, calvarium and extracranial soft tissues are normal. Sinuses/Orbits: No fluid levels or advanced mucosal thickening of the visualized paranasal sinuses. No mastoid or middle ear effusion. The orbits are normal. CTA NECK FINDINGS SKELETON: There is no bony spinal canal stenosis. No lytic or blastic lesion. OTHER NECK: Normal pharynx, larynx and major salivary glands. No cervical lymphadenopathy. Unremarkable thyroid gland. UPPER CHEST: No pneumothorax or pleural effusion. No nodules or masses. AORTIC ARCH: There is no calcific atherosclerosis of the aortic arch. There is no aneurysm, dissection or hemodynamically significant stenosis of the visualized portion of the aorta. Aberrant right subclavian artery. The visualized proximal subclavian arteries are widely patent. RIGHT CAROTID SYSTEM: Normal without aneurysm, dissection or stenosis. LEFT CAROTID SYSTEM: Normal without aneurysm, dissection or stenosis. VERTEBRAL ARTERIES: Left dominant configuration. Both origins are clearly patent. There is no dissection, occlusion or flow-limiting stenosis to the skull base (V1-V3 segments). CTA HEAD FINDINGS POSTERIOR CIRCULATION: --Vertebral arteries: Normal V4 segments. --Inferior cerebellar arteries: Normal. --Basilar artery: Normal. --Superior cerebellar arteries: Normal. --Posterior cerebral arteries (PCA): Mild bilateral multifocal stenoses are unchanged. ANTERIOR CIRCULATION: --Intracranial internal carotid arteries: Normal. --Anterior cerebral arteries (ACA): Unchanged area of severe stenosis of the left A2 segment --Middle cerebral arteries (MCA): There are multifocal severe bilateral stenoses, unchanged. Stenosis are most severe at the distal M1 and proximal M2 segments. VENOUS SINUSES: As  permitted by contrast timing, patent. ANATOMIC VARIANTS: None Review of the MIP images confirms the above findings. IMPRESSION: 1. No emergent large vessel occlusion. 2. Unchanged multifocal severe intracranial stenoses, including severe stenosis of the left anterior cerebral artery A2 segment and severe bilateral MCA distal M1 and proximal M2 stenoses. 3. 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 4. Aberrant right subclavian artery. Electronically Signed   By: KUlyses JarredM.D.   On: 06/15/2020 21:42    Procedures Procedures   Medications Ordered in ED Medications  insulin detemir (LEVEMIR) injection 10 Units (has no administration in time range)  insulin aspart (novoLOG) injection 0-9 Units (has no administration in time range)   stroke: mapping our early stages of recovery book (has no administration in time range)  acetaminophen (TYLENOL) tablet 650 mg (has no administration in time range)    Or  acetaminophen (TYLENOL) 160 MG/5ML solution 650 mg (has no administration in time range)    Or  acetaminophen (TYLENOL) suppository 650 mg (has no administration in time range)  enoxaparin (LOVENOX) injection 40 mg (has no administration in time range)  aspirin suppository 300 mg (has no administration in time range)    Or  aspirin tablet 325 mg (has no administration in time range)  levETIRAcetam (KEPPRA) IVPB 500 mg/100 mL premix (has no administration in time range)  iohexol (OMNIPAQUE) 350 MG/ML injection 75 mL (75 mLs Intravenous Contrast Given 06/15/20 2111)    ED Course  I have reviewed the triage vital signs and the nursing notes.  Pertinent labs & imaging results that were available during my care of the patient were reviewed by me and considered in my medical decision making (see chart for details).    MDM Rules/Calculators/A&P                          Patient with mental status change.  Aphasia.  Has waxed and waned somewhat.  Peers consistent with previous  strokes.  Code stroke called and showed chronic changes.  No definite acute changes.  Seen by Dr. Cheral Marker.  However will require admission to the hospital for further work-up.  Has been worked up for vasculitis in the past with negative work-up.  Admit to internal medicine and neurology has been consulting.  CRITICAL CARE Performed by: Davonna Belling Total critical care time: 30 minutes Critical care time was exclusive of separately billable procedures and treating other patients. Critical care was necessary to treat or prevent imminent or life-threatening deterioration. Critical care was time spent personally by me on the following activities: development of treatment plan with patient and/or surrogate as well as nursing, discussions with consultants, evaluation of patient's response to treatment, examination of patient, obtaining history from patient or surrogate, ordering and performing treatments and interventions, ordering and review of laboratory studies, ordering and review of radiographic studies, pulse oximetry and re-evaluation of patient's condition.  Final Clinical Impression(s) / ED Diagnoses Final diagnoses:  TIA (transient ischemic attack)    Rx / DC Orders ED Discharge Orders    None       Davonna Belling, MD 06/15/20 2318

## 2020-06-16 ENCOUNTER — Other Ambulatory Visit: Payer: Self-pay

## 2020-06-16 ENCOUNTER — Inpatient Hospital Stay (HOSPITAL_COMMUNITY): Payer: Medicaid Other

## 2020-06-16 LAB — BASIC METABOLIC PANEL
Anion gap: 7 (ref 5–15)
BUN: 11 mg/dL (ref 6–20)
CO2: 22 mmol/L (ref 22–32)
Calcium: 6.8 mg/dL — ABNORMAL LOW (ref 8.9–10.3)
Chloride: 110 mmol/L (ref 98–111)
Creatinine, Ser: 0.88 mg/dL (ref 0.61–1.24)
GFR, Estimated: >60 mL/min (ref >60)
Glucose, Bld: 85 mg/dL (ref 70–99)
Potassium: 2.5 mmol/L — CL (ref 3.5–5.1)
Sodium: 139 mmol/L (ref 135–145)

## 2020-06-16 LAB — GLUCOSE, CAPILLARY
Glucose-Capillary: 79 mg/dL (ref 70–99)
Glucose-Capillary: 80 mg/dL (ref 70–99)

## 2020-06-16 LAB — URINALYSIS, ROUTINE W REFLEX MICROSCOPIC
Bilirubin Urine: NEGATIVE
Glucose, UA: NEGATIVE mg/dL
Hgb urine dipstick: NEGATIVE
Ketones, ur: NEGATIVE mg/dL
Leukocytes,Ua: NEGATIVE
Nitrite: NEGATIVE
Protein, ur: NEGATIVE mg/dL
Specific Gravity, Urine: 1.029 (ref 1.005–1.030)
pH: 5 (ref 5.0–8.0)

## 2020-06-16 LAB — RAPID URINE DRUG SCREEN, HOSP PERFORMED
Amphetamines: NOT DETECTED
Barbiturates: NOT DETECTED
Benzodiazepines: NOT DETECTED
Cocaine: NOT DETECTED
Opiates: NOT DETECTED
Tetrahydrocannabinol: NOT DETECTED

## 2020-06-16 LAB — CBG MONITORING, ED
Glucose-Capillary: 95 mg/dL (ref 70–99)
Glucose-Capillary: 95 mg/dL (ref 70–99)
Glucose-Capillary: 108 mg/dL — ABNORMAL HIGH (ref 70–99)
Glucose-Capillary: 97 mg/dL (ref 70–99)
Glucose-Capillary: 78 mg/dL (ref 70–99)

## 2020-06-16 LAB — MAGNESIUM: Magnesium: 1.4 mg/dL — ABNORMAL LOW (ref 1.7–2.4)

## 2020-06-16 IMAGING — MR MR HEAD W/O CM
12 of 13 series · 44 of 48 positions shown · non-contrast
Comparison: CTA head and neck [DATE], brain MRI [DATE] and
earlier.

CLINICAL DATA: 45-year-old male with difficulty speaking.
Progressive multifocal cerebral infarcts since [DATE].

EXAM:
MRI HEAD WITHOUT CONTRAST
TECHNIQUE: Multiplanar, multiecho pulse sequences of the brain and surrounding
structures were obtained without intravenous contrast.

[Series 5: DWI · axial · 3.0mm · 0.88mm/px · z∈[-115,+30]mm · 8 of 104 slices shown (1 of 4)]
[im 1/104]
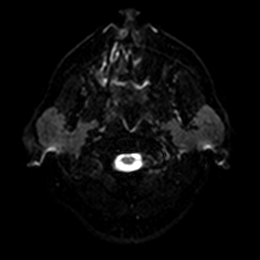
[im 15/104]
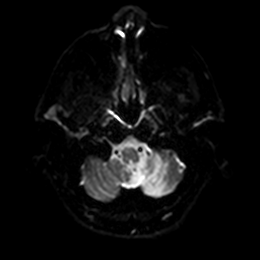
[im 30/104]
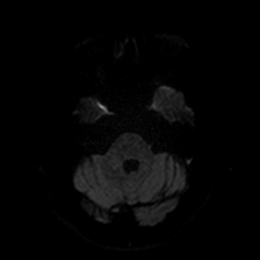
[im 45/104]
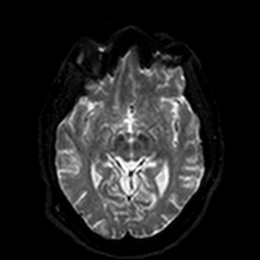
[im 59/104]
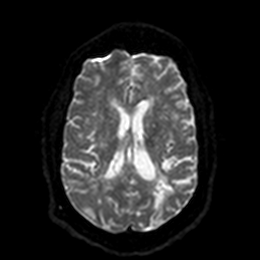
[im 74/104]
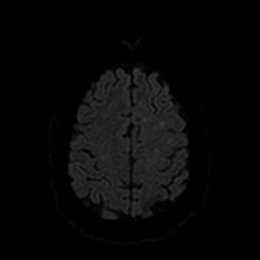
[im 89/104]
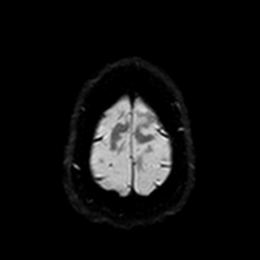
[im 104/104]
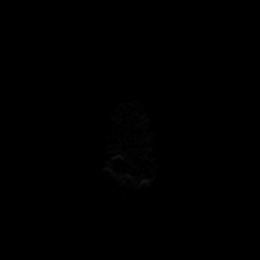

[Series 6: DWI · axial · 3.0mm · 0.88mm/px · z∈[-115,+30]mm · 4 of 52 slices shown (2 of 4)]
[im 1/52]
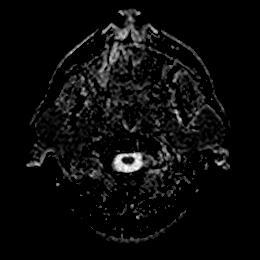
[im 18/52]
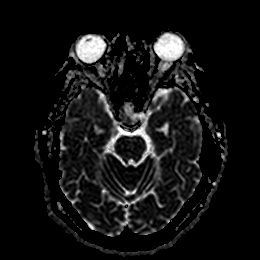
[im 35/52]
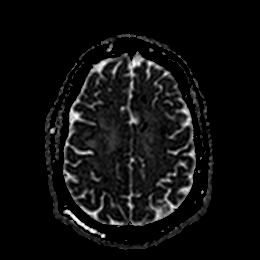
[im 52/52]
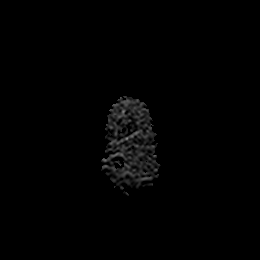

[Series 7: DWI · coronal · 4.0mm · 0.88mm/px · 5 of 72 slices shown (3 of 4)]
[im 1/72]
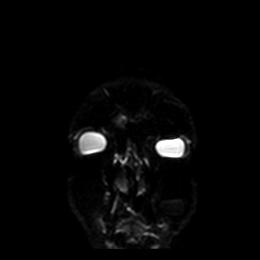
[im 18/72]
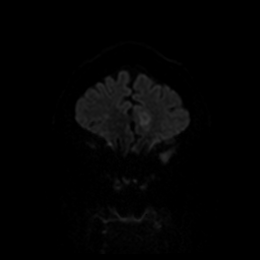
[im 36/72]
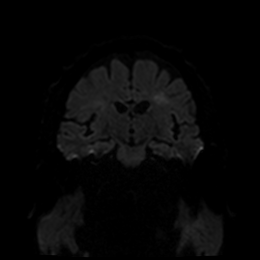
[im 54/72]
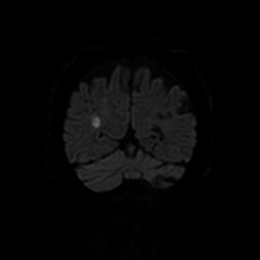
[im 72/72]
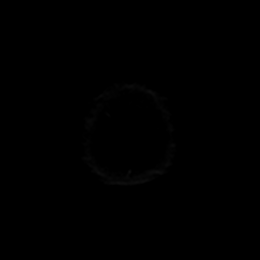

[Series 8: DWI · coronal · 4.0mm · 0.88mm/px · 3 of 36 slices shown (4 of 4)]
[im 1/36]
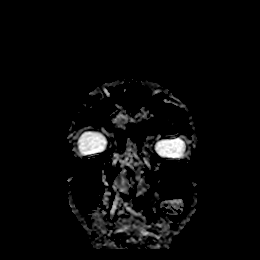
[im 18/36]
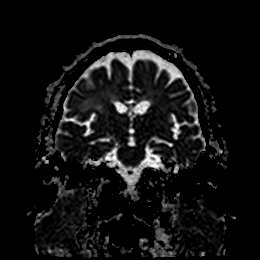
[im 36/36]
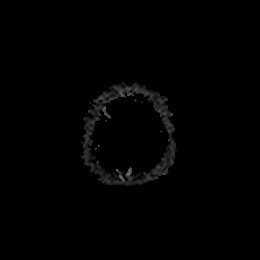

[Series 9: T1 · sagittal · 5.0mm · 0.75mm/px · 2 of 25 slices shown]
[im 1/25]
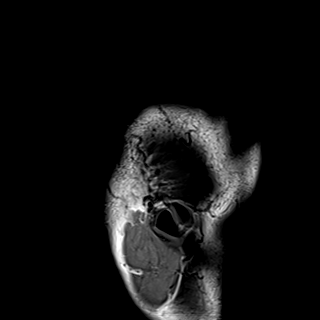
[im 25/25]
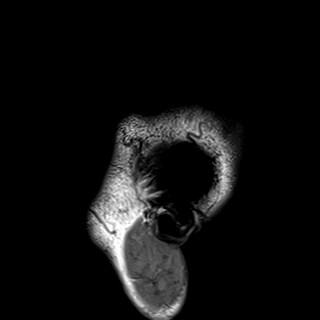

[Series 10: T2 · axial · 5.0mm · 0.72mm/px · z∈[-113,+36]mm · 2 of 27 slices shown (1 of 2)]
[im 1/27]
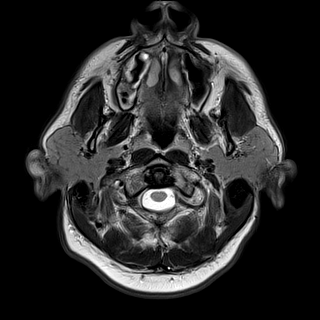
[im 27/27]
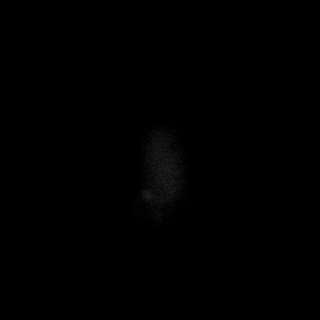

[Series 11: FLAIR · axial · 5.0mm · 0.90mm/px · z∈[-113,+36]mm · 2 of 27 slices shown]
[im 1/27]
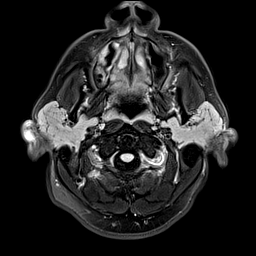
[im 27/27]
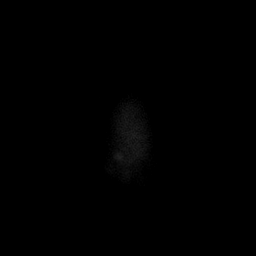

[Series 12: mag_images · axial · 3.0mm · 0.90mm/px · z∈[-116,+41]mm · 4 of 56 slices shown]
[im 1/56]
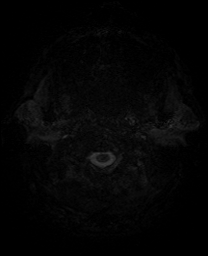
[im 19/56]
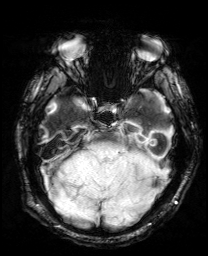
[im 37/56]
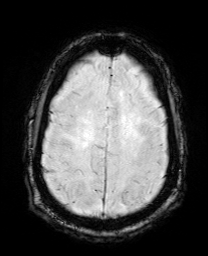
[im 56/56]
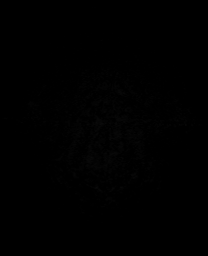

[Series 13: pha_images · axial · 3.0mm · 0.90mm/px · z∈[-116,+41]mm · 4 of 55 slices shown]
[im 1/55]
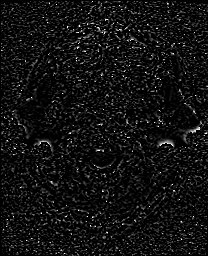
[im 19/55]
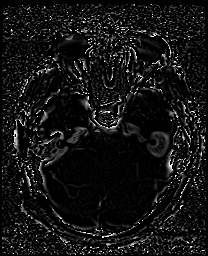
[im 37/55]
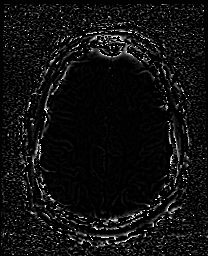
[im 55/55]
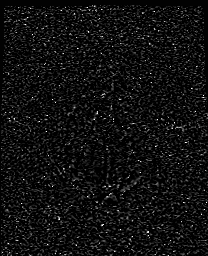

[Series 14: swi_images · axial · 3.0mm · 0.90mm/px · z∈[-116,+41]mm · 4 of 56 slices shown]
[im 1/56]
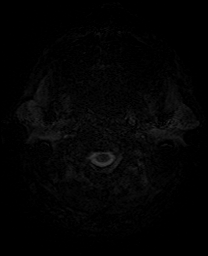
[im 19/56]
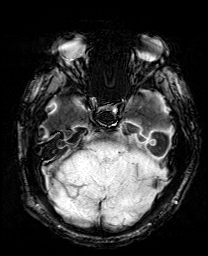
[im 37/56]
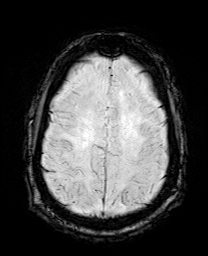
[im 56/56]
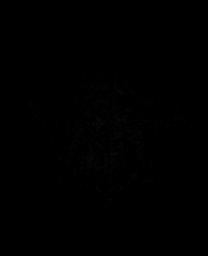

[Series 15: mip_images(sw) · axial · 24.0mm · 0.90mm/px · z∈[-106,+31]mm · 4 of 49 slices shown]
[im 1/49]
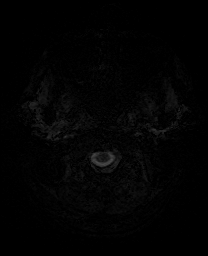
[im 17/49]
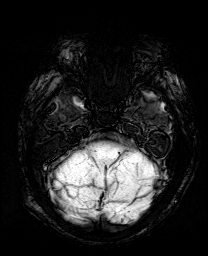
[im 33/49]
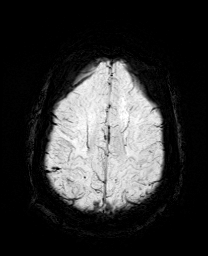
[im 49/49]
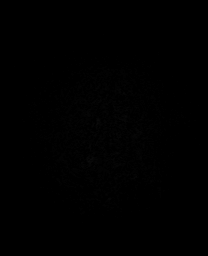

[Series 17: T2 · coronal · 5.0mm · 0.34mm/px · 2 of 29 slices shown (2 of 2)]
[im 1/29]
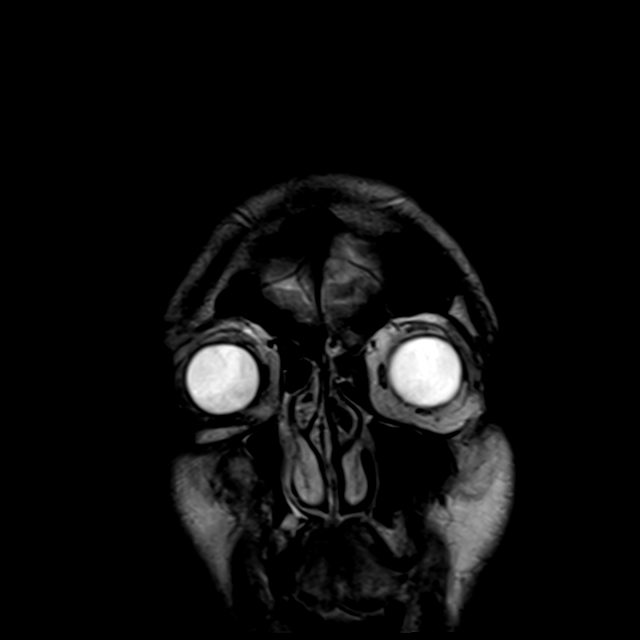
[im 29/29]
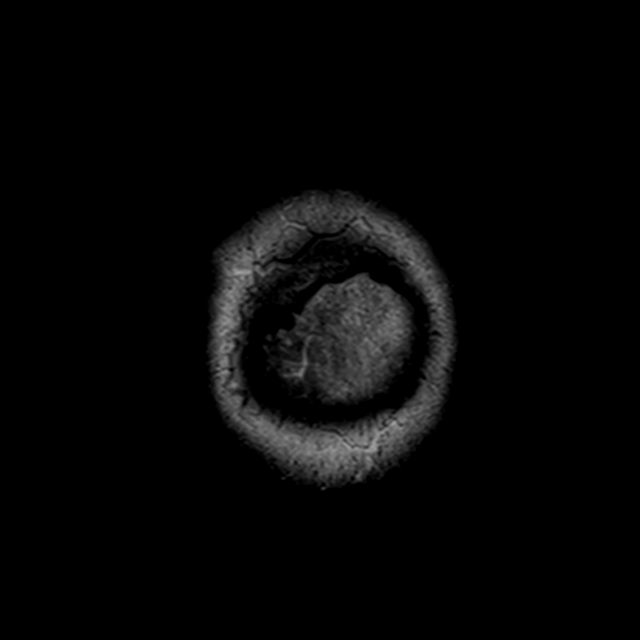

[44 of 48 positions shown; findings below may reference images not displayed]

FINDINGS: Brain: Since [DATE], small new cortical infarcts of the left
superior frontal gyrus pre motor area (series 5, image 93),
increased infarct with restricted diffusion in the left cingulate
gyrus (series 5, image 85)., progressed and more confluent right
periatrial white matter infarct on image 81, and small new posterior
right corona radiata infarct nearby on image 84.

No significant mass effect. No malignant hemorrhagic transformation.

Other widely scattered ischemic foci with abnormal diffusion in the
MCA and left ACA territories were pre-existing.

Deep gray nuclei, PCA territories, and posterior fossa relatively
spared as before; small left cerebellar infarct seen last month has
faded on diffusion series 5, image 67.

Superimposed subacute or chronic infarcts in the left parietal lobe
and left PICA territory with developing encephalomalacia. Scattered
hemosiderin in both hemispheres appears stable on SWI. No midline
shift, mass effect, evidence of mass lesion, ventriculomegaly.
Cervicomedullary junction and pituitary are within normal limits.

Vascular: Major intracranial vascular flow voids are stable with
intracranial artery tortuosity.

Skull and upper cervical spine: Stable. Visualized bone marrow
signal is within normal limits.

Sinuses/Orbits: Largely resolved paranasal sinus disease since last
month. Orbits remain negative.

Other: Trace right mastoid fluid is decreased. Grossly normal
visible internal auditory structures. Visible scalp and face appear
negative.
IMPRESSION: 1. Once again several new or increased acute infarcts are
demonstrated in both cerebral hemispheres (today the bilateral MCA
and left ACA territories) since the recent MRI [DATE]. No
malignant hemorrhagic transformation or mass effect.
2. Stable or expected evolution of ischemia elsewhere, including
developing encephalomalacia in the left parietal lobe and the left
cerebellum (PICA) territories.
3. Improved paranasal sinus aeration since last month.

## 2020-06-16 MED ORDER — BASAGLAR KWIKPEN 100 UNIT/ML ~~LOC~~ SOPN
PEN_INJECTOR | SUBCUTANEOUS | 0 refills | Status: DC
  Filled 2020-06-16: qty 6, 30d supply, fill #0

## 2020-06-16 MED ORDER — ASPIRIN EC 81 MG PO TBEC: DELAYED_RELEASE_TABLET | ORAL | 0 refills | Status: DC

## 2020-06-16 MED ORDER — POTASSIUM CHLORIDE CRYS ER 20 MEQ PO TBCR: 40.0000 meq | EXTENDED_RELEASE_TABLET | ORAL | Status: DC

## 2020-06-16 MED ORDER — POTASSIUM CHLORIDE 10 MEQ/100ML IV SOLN
10.0000 meq | INTRAVENOUS | Status: AC
  Administered 2020-06-16 (×5): 10 meq via INTRAVENOUS
  Filled 2020-06-16 (×5): qty 100

## 2020-06-16 MED ORDER — LACTATED RINGERS IV SOLN: INTRAVENOUS | Status: DC

## 2020-06-16 MED ORDER — MAGNESIUM SULFATE 2 GM/50ML IV SOLN
2.0000 g | Freq: Once | INTRAVENOUS | Status: AC
  Administered 2020-06-16: 2 g via INTRAVENOUS
  Filled 2020-06-16: qty 50

## 2020-06-16 MED ORDER — POTASSIUM CHLORIDE 10 MEQ/100ML IV SOLN
INTRAVENOUS | Status: AC
  Administered 2020-06-16: 10 meq via INTRAVENOUS
  Filled 2020-06-16: qty 100

## 2020-06-16 NOTE — Progress Notes (Addendum)
STROKE TEAM PROGRESS NOTE   INTERVAL HISTORY His significant other, Nathaniel Spears, is at the bedside. She provides most  No acute events since arrival. Patient continues to have difficulty with his speech but is able to speak comprehensible one word answers at times and is quite frustrated at others.  Nathaniel Spears reports she is administering and managing medications and is thus sure of medication compliance.  We discussed stroked diagnosis, ongoing plan of care and work up. We discussed the option of transfer to a higher level of care for further evaluation and care. They chose to attempt transfer to [facility]. This was also discussed with sister, Nathaniel Spears by phone. She also agreed.   Vitals:   06/16/20 1500 06/16/20 1515 06/16/20 1552 06/16/20 1730  BP: (!) 129/97 (!) 128/96 119/88 133/85  Pulse:   88 87  Resp: _0 Temp:    98.4 F (36.9 C)  TempSrc:    Oral  SpO2:   100% 100%  Weight:      Height:       CBC:  Recent Labs  Lab 06/15/20 2052 06/15/20 2058  WBC 8.9  --   NEUTROABS 4.2  --   HGB 12.9* 13.6  HCT 40.1 40.0  MCV 80.4  --   PLT 334  --    Basic Metabolic Panel:  Recent Labs  Lab 06/15/20 2052 06/15/20 2058 06/16/20 0735  NA 138 139 139  K 3.4* 3.4* 2.5*  CL 99 98 110  CO2 29  --  22  GLUCOSE 109* 108* 85  BUN _1 CREATININE 1.43* 1.50* 0.88  CALCIUM 9.1  --  6.8*  MG  --   --  1.4*   Lipid Panel: No results for input(s): CHOL, TRIG, HDL, CHOLHDL, VLDL, LDLCALC in the last 168 hours. HgbA1c: No results for input(s): HGBA1C in the last 168 hours. Urine Drug Screen:  Recent Labs  Lab 06/16/20 1020  LABOPIA NONE DETECTED  COCAINSCRNUR NONE DETECTED  LABBENZ NONE DETECTED  AMPHETMU NONE DETECTED  THCU NONE DETECTED  LABBARB NONE DETECTED    Alcohol Level  Recent Labs  Lab 06/15/20 2052  ETH <10    IMAGING past 24 hours CT ANGIO NECK W OR WO CONTRAST  Result Date: 06/15/2020 CLINICAL DATA:  Right-sided deficits and aphasia EXAM: CT  ANGIOGRAPHY HEAD AND NECK TECHNIQUE: Multidetector CT imaging of the head and neck was performed using the standard protocol during bolus administration of intravenous contrast. Multiplanar CT image reconstructions and MIPs were obtained to evaluate the vascular anatomy. Carotid stenosis measurements (when applicable) are obtained utilizing NASCET criteria, using the distal internal carotid diameter as the denominator. CONTRAST:  57m OMNIPAQUE IOHEXOL 350 MG/ML SOLN COMPARISON:  CTA head neck 05/31/2020 FINDINGS: CT HEAD FINDINGS Brain: Old infarcts of the left cerebellum and posterior left parietal lobe. No acute hemorrhage. The size and configuration of the ventricles and extra-axial CSF spaces are normal. There is hypoattenuation of the periventricular white matter, most commonly indicating chronic ischemic microangiopathy. Skull: The visualized skull base, calvarium and extracranial soft tissues are normal. Sinuses/Orbits: No fluid levels or advanced mucosal thickening of the visualized paranasal sinuses. No mastoid or middle ear effusion. The orbits are normal. CTA NECK FINDINGS SKELETON: There is no bony spinal canal stenosis. No lytic or blastic lesion. OTHER NECK: Normal pharynx, larynx and major salivary glands. No cervical lymphadenopathy. Unremarkable thyroid gland. UPPER CHEST: No pneumothorax or pleural effusion. No nodules or masses. AORTIC ARCH: There is no  calcific atherosclerosis of the aortic arch. There is no aneurysm, dissection or hemodynamically significant stenosis of the visualized portion of the aorta. Aberrant right subclavian artery. The visualized proximal subclavian arteries are widely patent. RIGHT CAROTID SYSTEM: Normal without aneurysm, dissection or stenosis. LEFT CAROTID SYSTEM: Normal without aneurysm, dissection or stenosis. VERTEBRAL ARTERIES: Left dominant configuration. Both origins are clearly patent. There is no dissection, occlusion or flow-limiting stenosis to the skull  base (V1-V3 segments). CTA HEAD FINDINGS POSTERIOR CIRCULATION: --Vertebral arteries: Normal V4 segments. --Inferior cerebellar arteries: Normal. --Basilar artery: Normal. --Superior cerebellar arteries: Normal. --Posterior cerebral arteries (PCA): Mild bilateral multifocal stenoses are unchanged. ANTERIOR CIRCULATION: --Intracranial internal carotid arteries: Normal. --Anterior cerebral arteries (ACA): Unchanged area of severe stenosis of the left A2 segment --Middle cerebral arteries (MCA): There are multifocal severe bilateral stenoses, unchanged. Stenosis are most severe at the distal M1 and proximal M2 segments. VENOUS SINUSES: As permitted by contrast timing, patent. ANATOMIC VARIANTS: None Review of the MIP images confirms the above findings. IMPRESSION: 1. No emergent large vessel occlusion. 2. Unchanged multifocal severe intracranial stenoses, including severe stenosis of the left anterior cerebral artery A2 segment and severe bilateral MCA distal M1 and proximal M2 stenoses. 3. 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 4. Aberrant right subclavian artery. Electronically Signed   By: Ulyses Jarred M.D.   On: 06/15/2020 21:42   MR BRAIN WO CONTRAST  Result Date: 06/16/2020 CLINICAL DATA:  45 year old male with difficulty speaking. Progressive multifocal cerebral infarcts since 04/20/2020. EXAM: MRI HEAD WITHOUT CONTRAST TECHNIQUE: Multiplanar, multiecho pulse sequences of the brain and surrounding structures were obtained without intravenous contrast. COMPARISON:  CTA head and neck 06/15/2020, brain MRI 06/04/2020 and earlier. FINDINGS: Brain: Since 06/04/2020, small new cortical infarcts of the left superior frontal gyrus pre motor area (series 5, image 93), increased infarct with restricted diffusion in the left cingulate gyrus (series 5, image 85)., progressed and more confluent right periatrial white matter infarct on image 81, and small new posterior right corona radiata infarct  nearby on image 84. No significant mass effect. No malignant hemorrhagic transformation. Other widely scattered ischemic foci with abnormal diffusion in the MCA and left ACA territories were pre-existing. Deep gray nuclei, PCA territories, and posterior fossa relatively spared as before; small left cerebellar infarct seen last month has faded on diffusion series 5, image 67. Superimposed subacute or chronic infarcts in the left parietal lobe and left PICA territory with developing encephalomalacia. Scattered hemosiderin in both hemispheres appears stable on SWI. No midline shift, mass effect, evidence of mass lesion, ventriculomegaly. Cervicomedullary junction and pituitary are within normal limits. Vascular: Major intracranial vascular flow voids are stable with intracranial artery tortuosity. Skull and upper cervical spine: Stable. Visualized bone marrow signal is within normal limits. Sinuses/Orbits: Largely resolved paranasal sinus disease since last month. Orbits remain negative. Other: Trace right mastoid fluid is decreased. Grossly normal visible internal auditory structures. Visible scalp and face appear negative. IMPRESSION: 1. Once again several new or increased acute infarcts are demonstrated in both cerebral hemispheres (today the bilateral MCA and left ACA territories) since the recent MRI 06/04/2020. No malignant hemorrhagic transformation or mass effect. 2. Stable or expected evolution of ischemia elsewhere, including developing encephalomalacia in the left parietal lobe and the left cerebellum (PICA) territories. 3. Improved paranasal sinus aeration since last month. Electronically Signed   By: Genevie Ann M.D.   On: 06/16/2020 07:09   CT ANGIO HEAD CODE STROKE  Result Date: 06/15/2020 CLINICAL DATA:  Right-sided deficits  and aphasia EXAM: CT ANGIOGRAPHY HEAD AND NECK TECHNIQUE: Multidetector CT imaging of the head and neck was performed using the standard protocol during bolus administration of  intravenous contrast. Multiplanar CT image reconstructions and MIPs were obtained to evaluate the vascular anatomy. Carotid stenosis measurements (when applicable) are obtained utilizing NASCET criteria, using the distal internal carotid diameter as the denominator. CONTRAST:  57mL OMNIPAQUE IOHEXOL 350 MG/ML SOLN COMPARISON:  CTA head neck 05/31/2020 FINDINGS: CT HEAD FINDINGS Brain: Old infarcts of the left cerebellum and posterior left parietal lobe. No acute hemorrhage. The size and configuration of the ventricles and extra-axial CSF spaces are normal. There is hypoattenuation of the periventricular white matter, most commonly indicating chronic ischemic microangiopathy. Skull: The visualized skull base, calvarium and extracranial soft tissues are normal. Sinuses/Orbits: No fluid levels or advanced mucosal thickening of the visualized paranasal sinuses. No mastoid or middle ear effusion. The orbits are normal. CTA NECK FINDINGS SKELETON: There is no bony spinal canal stenosis. No lytic or blastic lesion. OTHER NECK: Normal pharynx, larynx and major salivary glands. No cervical lymphadenopathy. Unremarkable thyroid gland. UPPER CHEST: No pneumothorax or pleural effusion. No nodules or masses. AORTIC ARCH: There is no calcific atherosclerosis of the aortic arch. There is no aneurysm, dissection or hemodynamically significant stenosis of the visualized portion of the aorta. Aberrant right subclavian artery. The visualized proximal subclavian arteries are widely patent. RIGHT CAROTID SYSTEM: Normal without aneurysm, dissection or stenosis. LEFT CAROTID SYSTEM: Normal without aneurysm, dissection or stenosis. VERTEBRAL ARTERIES: Left dominant configuration. Both origins are clearly patent. There is no dissection, occlusion or flow-limiting stenosis to the skull base (V1-V3 segments). CTA HEAD FINDINGS POSTERIOR CIRCULATION: --Vertebral arteries: Normal V4 segments. --Inferior cerebellar arteries: Normal. --Basilar  artery: Normal. --Superior cerebellar arteries: Normal. --Posterior cerebral arteries (PCA): Mild bilateral multifocal stenoses are unchanged. ANTERIOR CIRCULATION: --Intracranial internal carotid arteries: Normal. --Anterior cerebral arteries (ACA): Unchanged area of severe stenosis of the left A2 segment --Middle cerebral arteries (MCA): There are multifocal severe bilateral stenoses, unchanged. Stenosis are most severe at the distal M1 and proximal M2 segments. VENOUS SINUSES: As permitted by contrast timing, patent. ANATOMIC VARIANTS: None Review of the MIP images confirms the above findings. IMPRESSION: 1. No emergent large vessel occlusion. 2. Unchanged multifocal severe intracranial stenoses, including severe stenosis of the left anterior cerebral artery A2 segment and severe bilateral MCA distal M1 and proximal M2 stenoses. 3. 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 4. Aberrant right subclavian artery. Electronically Signed   By: Ulyses Jarred M.D.   On: 06/15/2020 21:42   PHYSICAL EXAM Mental Status: Awake and alert. Severe expressive and receptive aphasia. Able to state his name and signifcant other's. Unable to test orientation. Unable to name 3 objects. Become frustrated with aphasia as evidenced by body language.  Cranial Nerves: II: Blinks to threat in right hemifield. No blink to threat in left hemifield. PERRL.   III,IV, VI: Can track to the left and right conjugately but with coarse horizontal nystagmus during pursuits. Had difficulty following commands for testing of upgaze.  V: Grossly intact responses to tactile stimulation on left and right. Unable to formally assess sensation due to aphasia.  VII: At rest, left perioral muscles with decreased tone, but elevates left side of mouth more briskly than right when asked to smile.  VIII: Hearing intact to some questions and commands.  IX,X: Not following commands for assessment XI: Head is midline.  XII: Does not protrude  tongue to command Motor/Sensory: LUE: Mildly increased  flexor tone. Slowly drifts to bed after passive elevation and release. Will grip examiner's hand and resist movement somewhat, but unable to formally test due to comprehension deficit. Reacts to pinch after a delay.  RUE: Drifts more slowly to bed than left with passive elevation and release. More briskly reacts to pinch than on the left. More spontaneous movement as well as more brisk and purposeful movement on the right relative to the left.  LLE: Drifts slowly to bed after passive elevation and release. Flexes at knee and hip when withdrawing to noxious with 4-/5 strength. Reaction to noxious is brisk.  RLE: Drifts less slowly to bed than on left. Strength when withdrawing to noxious is 4/5. Reaction to noxious is brisker than on the left.  Deep Tendon Reflexes: Does not relax limbs sufficiently for definitive assessment.  Plantars: Equivocal bilaterally  Cerebellar: No gross ataxia with limited assessment. Patient unable to follow commands for detailed testing.  Gait: Deferred    ASSESSMENT/PLAN [name] is a 45 y.o. male with history of recent recurrent cryptogenic ischemic strokes 4/5-4/8, 5/15-19, repeat ER visit 5/19, 5/20-5/31 here and transferred to [facility],   multifocal severe intracranial stenoses, negative work up for vasculitis last week,  uncontrolled DM2, HLD, HTN, obesity and previous cocaine use. He was discharged and only home for a day when EMS was activated for the current episode of acute aphasia (alert but no verbal response at all) x 30 minutes.    CTA head & neck: No LVO. Unchanged multifocal severe intracranial stenoses, including severe stenosis of the left anterior cerebral artery A2 segment and severe bilateral MCA distal M1 and proximal M2 stenoses.  IFO:YDXA again several new or increased acute infarcts are  demonstrated in both cerebral hemispheres (today the bilateral MCA and left ACA territories)  since the recent MRI 06/04/2020. Stable or expected evolution of ischemia elsewhere, including       developing encephalomalacia in the left parietal lobe and the       left cerebellum (PICA) territories.  Recent TEE  5/17: EF 60-65%. Severe LVH. No shunt, thrombus or wall motion abnormality.   Recent Cerebral angiogram 5/18 : 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 3. Mod severe focal stenosis of Lt MCA inf division parietal branch.4. Focal stenosis of Lt ACA prox pericallosal branches  Recent LP 5/20 :  LP [facility] at [facility] on 5/20 was bland, with normal WBC and protein, as well as normal CSF IgG index. CSF OCBs were negative. VDRL was negative. His CRP was elevated at 5.3 and ESR was 57. Hypercoagulable panel was unremarkable.  VZV IgM on 5/20 was negative. CSF VZV PCR could not be performed due to insufficient sample.   5/21-5/23 labs: ANA negative. Lupus panel was negtive. Complement levels were not decreased, but C3 complement level was elevated at 203. Proetein C and S were normal. PT and PTT were normal. Lupus anticoagulant screen was negative. DRVVT normal. Beta2 glycoprotein Abs were normal. Vitamin B12 normal. Homocysteine normal. Ferritin normal.   LDL 39  HgbA1c 10.3  VTE prophylaxis - On Lovenox PPX    Diet   Diet NPO time specified     On DAPT with ASA 325 and Plavix prior to admission, significant other, Nathaniel Spears, reports she is managing medications and can report he has been taking reliably.   On ASA 325 and Plavix for the present   Therapy recommendations: TBD  Disposition:  TBD  Possible treatment option of transfer to higher to level of care: [facility], Dr. Guerry Bruin, contacted for possible transfer but advised nothing further to offer after case review.   Concern for seizure  Started for "staring spells" on repeat ER visit 5/19  On Keppra   EEG pending  Hypertension . Permissive  hypertension (OK if < 220/120) but gradually normalize in 5-7 days . Long-term BP goal normotensive  Hyperlipidemia  Home meds: resumed in hospital  LDL 39, at goal < 70  High intensity statin: Lipitor 85m resumed  Continue statin at discharge  Diabetes type II Uncontrolled  HgbA1c 10.3, goal < 7.0  CBGs  Management per primary team  Close PCP follow up and management  Other Stroke Risk Factors  History of cocaine use a few months a go, but negative since then  ETOH use, alcohol level <10, advised to drink no more than1drink(s) a day  Obesity,Body mass index is 32.95 kg/m., BMI >/= 30 associated with increased stroke risk, recommend weight loss, diet and exercise as appropriate  History of strokes  High risk for OSA.   Other Active Problems    Hospital day # 1  Delila A Bailey-Modzik, NP-C   ATTENDING NOTE: I reviewed above note and agree with the assessment and plan. Pt was seen and examined.   45year old male with history of hypertension, smoker, diabetes, obesity, cocaine use, previous stroke admitted again for aphasia.   Patient has been admitted several times for the last 2 months.  Patient stroke in 04/2020 with aphasia, intermittent headache and dizziness.  CT showed old left cerebellum and left parieto-occipital infarct.  MRI showed left MCA patchy infarcts.  MRA and CTA head and neck showed bilateral MCA branch high-grade stenosis.  EF 60 to 65%.  LDL 53, A1c 11.1, UDS positive for cocaine.  Patient discharged with DAPT and Lipitor 40.  Admitted again on 05/31/2020 for increased weakness and slurred speech.  Patient MRI showed bilateral ACA scattered left more than right acute infarct.  Also had interval bilateral MCA small infarcts from last stroke.  CTA head and neck showed prominent intracranial atherosclerosis, new occlusion versus high-grade stenosis at the left A2.  High-grade stenosis bilateral MCA vascular tree with persistent occlusion of  distal left M3 and worsening stenosis right M3.  A1c 10.3, LDL 39, UDS negative this time. TEE no source of emboli, no PFO. Cerebral angio Occluded Lt. MCA Sup division in mid to distal M2 seg. Severe tandem stenosis of RTACA distal A2 and prox A3. Mod severe focal stenosis of Lt MCA inf division parietal branch. Focal stenosis of Lt ACA prox pericallosal branches. Discussed with Dr. DEstanislado Pandy angiogram not consistent with vasculitis pattern.  Discharged with aspirin 325 and Plavix DAPT for 3 months.  Admitted again on 06/04/2020 for right lower extremity weakness after a fall.  Stat CT no interval changes.  CTA head and neck stable focal occlusion versus severe stenosis left A2, progressive vessel irregularity with multiple progressive severe stenosis within left ACA vascular tree.  Progressive severe focal stenosis within the proximal left M2.  Stable severe stenosis bilateral M2 and M3.  Moderate stenosis left P2.  MRI brain with recurrent several small areas of acute infarct including right frontal cortex, right parietal white matter and cortex, left cerebellum.  Given recurrent embolic pattern, concerning for vasculitis. LP 5/20 was bland, with normal WBC and protein, as well as normal CSF IgG index. CSF OCBs were negative. VDRL was negative. His CRP was elevated  at 5.3 and ESR was 57. Hypercoagulable panel was unremarkable. Tox screen on 5/21 was negative. VZV IgM on 5/20 was negative. CSF VZV PCR could not be performed due to insufficient sample. Lupus panel was negtive. Copmlement levels were not decreased, but C3 complement level was elevated at 203. Proetein C and S were normal. PT and PTT were normal. Lupus anticoagulant screen was negative. DRVVT normal. Beta2 glycoprotein Abs were normal. Vitamin B12 normal. Homocysteine normal. Ferritin normal. Cholesterol panel negative except for decreased HDL.  He was transferred to [facility] for family request. He was treated with IV Solumedrol empirically, 1000  mg IV qd for 3 days over there, reported slight improvement, but caused significant elevation of blood glucose which required endocrinology consultation. In [place], stroke etiology still considered as uncontrolled risk factor, felt vasculitis less likely.  He was discharged with DAPT and statin.  On this admission, CT head and neck showed unchanged multifocal severe intracranial stenosis.  MRI showed again several new or increased acute infarct at both cerebral hemispheres including bilateral MCA and left ACA.  Repeat ESR and CRP pending.  Dr. Cheral Marker suggested brain biopsy.  Discussed with family regarding transfer out to academic stroke centers, family requested [facility].  [facility] transfer center called, I was able to talk to Dr. Nancy Fetter.  After he reviewed extensively patient chart and images and also discussed with his colleagues at [facility], he called me back and we had long discussion regarding patient possible stroke etiology.  Currently, he still thinking that patient longstanding uncontrolled risk factors including diabetes, hypertension, hyperlipidemia, substance use, smoking contributed to his recent recurrent strokes, he does not think further work-up indicated at this time as we have done extensive work-up recently.  Patient would be in high risk for recurrent stroke especially within 3 months of first stroke which is a known fact.  Further treatment will be focused on more rigorous risk factor control, agree with continued DAPT treatment.  Currently he does not think transfer to [facility] as needed at this time.  On exam, no family at the bedside, patient awake, alert, eyes open, orientated to place and self, however difficulty answer other questions due to aphasia. Partial global aphasia, able to repeat but not name and nonfluent language with limited speech output, able to follow a couple of midline commands but not other commands, not peripheral commands. No gaze palsy, tracking bilaterally, however not  blinking to visual threat bilaterally, PERRL. Right nasolabial fold flattening. Tongue midline. Bilateral UEs 4/5, no drift. Bilaterally LEs 4/5, no drift. Sensation and coordination not cooperative, gait not tested.   Patient still n.p.o. at this time, on aspirin 300 PR.  Will consider DAPT with aspirin and Brilinta and continue statin once p.o. access.  For detailed assessment and plan, please refer to above as I have made changes wherever appropriate.   Rosalin Hawking, MD PhD Stroke Neurology 06/16/2020 7:31 PM  I discussed with Dr. Nancy Fetter in [place]. I spent  35 minutes in total face-to-face time with the patient, more than 50% of which was spent in counseling and coordination of care, reviewing test results, images and medication, and discussing the diagnosis, treatment plan and potential prognosis. This patient's care requiresreview of multiple databases, neurological assessment, discussion with family, other specialists and medical decision making of high complexity.   To contact Stroke Continuity provider, please refer to [email or web address] After hours, contact General Neurology

## 2020-06-16 NOTE — ED Notes (Signed)
Pt repeatedly removing monitor leads and attempting to get out of bed. Redirection attempted, but pt does not appear to understand instructions or attempts to reorient. Pt is not responding verbally at this time. Pt placed back in bed and provided blankets.

## 2020-06-16 NOTE — ED Notes (Addendum)
Pt visitor alerted staff he was attempting to get out of bed. Pt found sitting on edge of bed having removed monitor leads. Staff attempted to redirect pt lay back in bed. Pt did not appear to understand instruction and began slamming fists repeatedly on mattress. After pt calmed down, pt was repositioned properly in bed. MD made aware.

## 2020-06-16 NOTE — ED Notes (Signed)
Notified of critical lab value: Potassium of 2.5.  MD paged.

## 2020-06-16 NOTE — ED Notes (Signed)
Pt redirected again to remain in bed

## 2020-06-16 NOTE — ED Notes (Signed)
PRN ativan administered d/t pt becoming increasingly restless and actively attempting to get out of bed.

## 2020-06-16 NOTE — ED Notes (Signed)
Patient transported to MRI 

## 2020-06-16 NOTE — ED Notes (Signed)
Per MD [facility] has ben contacted for possible transfer ,

## 2020-06-16 NOTE — Plan of Care (Signed)
I spoke with patient, significant other [name] (primary caregiver) at the bedside and sister, Jabir Dahlem, by phone to discuss seeking transfer to higher level of care for further stroke etiology work up. They chose to attempt transfer to [facility]. [facility] transfer team, Eula Listen, was contacted and she kindly initiated the process. Images were power shared. Dr. Algis Greenhouse of [facility] consulted with Dr. Roda Shutters and after review of the case later today advised no need for transfer as they did not feel they could add to work up. [name] was contacted by phone and advised of this update in the plan of care. [name] agreed to update sister and patient with this news and discuss pursuing another institution for a higher level of care or continuing care here without further pursuit of transfer. I will plan to check in with them in the am.

## 2020-06-16 NOTE — Progress Notes (Signed)
PROGRESS NOTE    Nathaniel Spears  MEQ:[id number] DOB: 06-11-1975 DOA: 06/15/2020 PCP: Grayce Sessions, NP     Brief Narrative:  Nathaniel Spears is a 45 year old male with past medical history significant for hypertension, diabetes, hyperlipidemia who presented to the hospital with chief complaint of aphasia.  Patient has been hospitalized for recurrent stroke since 04/20/2020.  Patient was admitted from 5/22 5/23 for acute strokes, transfer to [facility] for further neurologic work-up.  Work-up for CNS vasculitis was negative.  He now returns back to the emergency department with complaints of aphasia.  Neurology consulted.   New events last 24 hours / Subjective: Girlfriend at bedside provides most of the history.  She states that patient has been having "staring spells" intermittently, significant weakness, unable to walk without assistance.  She states that patient has not missed any doses of his aspirin or Plavix since discharge from [facility]  Assessment & Plan:   Principal Problem:   Acute ischemic stroke [facility]) Active Problems:   Essential hypertension   Type 2 diabetes mellitus with hyperglycemia, with long-term current use of insulin (HCC)   Recurrent stroke -MRI brain showed several new or increased acute infarcts in both cerebral hemispheres -CTA head and neck: No emergent large vessel occlusion, unchanged multifocal severe intracranial stenosis -Neurology following -Continue aspirin. Plavix and lipitor on hole as patient failed bedside swallow eval  -Keppra (started for seizure prophylaxis in May) -PT OT SLP  Hypertension -Allow permissive hypertension in setting of acute stroke  Diabetes mellitus type 2 -Continue Levemir, sliding scale insulin  Hypokalemia -Replace, trend  Hypomagnesemia -Replace, trend    DVT prophylaxis:  enoxaparin (LOVENOX) injection 40 mg Start: 06/16/20 1000  Code Status:     Code Status Orders  (From admission, onward)         Start      Ordered   06/15/20 2305  Full code  Continuous        06/15/20 2308        Code Status History    Date Active Date Inactive Code Status Order ID Comments User Context   06/04/2020 2222 06/07/2020 0623 Full Code [id number]  Marinda Elk, MD Inpatient   05/30/2020 2103 06/03/2020 1524 Full Code [id number]  Lewie Chamber, MD ED   04/20/2020 1922 04/23/2020 1909 Full Code [id number]  Charlsie Quest, MD ED   Advance Care Planning Activity     Family Communication: Girlfriend at bedside Disposition Plan:  Status is: Inpatient  Remains inpatient appropriate because:Ongoing diagnostic testing needed not appropriate for outpatient work up   Dispo: The patient is from: Home              Anticipated d/c is to: SNF              Patient currently is not medically stable to d/c.   Difficult to place patient No      Consultants:   Neurology  Procedures:   None   Antimicrobials:  Anti-infectives (From admission, onward)   None        Objective: Vitals:   06/16/20 0400 06/16/20 0530 06/16/20 0813 06/16/20 1017  BP: 93/70 107/80 (!) 125/93 116/89  Pulse: 82 87 86 86  Resp: 18 16 17 16   Temp:      TempSrc:      SpO2: 93% 97% 98% 100%  Weight:      Height:       No intake or output data in the 24 hours ending 06/16/20 1141 Filed  Weights   06/15/20 2146  Weight: 115 kg    Examination:  General exam: Appears calm and comfortable  Respiratory system: Clear to auscultation. Respiratory effort normal. No respiratory distress.  Cardiovascular system: S1 & S2 heard, RRR. No murmurs. No pedal edema. Gastrointestinal system: Abdomen is nondistended, soft and nontender.  Central nervous system: Alert, does not interact much during our examination Extremities: Symmetric in appearance  Skin: No rashes, lesions or ulcers on exposed skin    Data Reviewed: I have personally reviewed following labs and imaging studies  CBC: Recent Labs  Lab 06/15/20 2052 06/15/20 2058   WBC 8.9  --   NEUTROABS 4.2  --   HGB 12.9* 13.6  HCT 40.1 40.0  MCV 80.4  --   PLT 334  --    Basic Metabolic Panel: Recent Labs  Lab 06/15/20 2052 06/15/20 2058 06/16/20 0735  NA 138 139 139  K 3.4* 3.4* 2.5*  CL 99 98 110  CO2 29  --  22  GLUCOSE 109* 108* 85  BUN 18 20 11   CREATININE 1.43* 1.50* 0.88  CALCIUM 9.1  --  6.8*  MG  --   --  1.4*   GFR: Estimated Creatinine Clearance: 138.8 mL/min (by C-G formula based on SCr of 0.88 mg/dL). Liver Function Tests: Recent Labs  Lab 06/15/20 2052  AST 12*  ALT 15  ALKPHOS 57  BILITOT 0.8  PROT 6.7  ALBUMIN 3.5   No results for input(s): LIPASE, AMYLASE in the last 168 hours. No results for input(s): AMMONIA in the last 168 hours. Coagulation Profile: Recent Labs  Lab 06/15/20 2052  INR 1.1   Cardiac Enzymes: No results for input(s): CKTOTAL, CKMB, CKMBINDEX, TROPONINI in the last 168 hours. BNP (last 3 results) No results for input(s): PROBNP in the last 8760 hours. HbA1C: No results for input(s): HGBA1C in the last 72 hours. CBG: Recent Labs  Lab 06/15/20 2007 06/16/20 0130 06/16/20 0448 06/16/20 0751  GLUCAP 101* 95 95 108*   Lipid Profile: No results for input(s): CHOL, HDL, LDLCALC, TRIG, CHOLHDL, LDLDIRECT in the last 72 hours. Thyroid Function Tests: No results for input(s): TSH, T4TOTAL, FREET4, T3FREE, THYROIDAB in the last 72 hours. Anemia Panel: No results for input(s): VITAMINB12, FOLATE, FERRITIN, TIBC, IRON, RETICCTPCT in the last 72 hours. Sepsis Labs: No results for input(s): PROCALCITON, LATICACIDVEN in the last 168 hours.  Recent Results (from the past 240 hour(s))  Resp Panel by RT-PCR (Flu A&B, Covid) Nasopharyngeal Swab     Status: None   Collection Time: 06/15/20 10:26 PM   Specimen: Nasopharyngeal Swab; Nasopharyngeal(NP) swabs in vial transport medium  Result Value Ref Range Status   SARS Coronavirus 2 by RT PCR NEGATIVE NEGATIVE Final    Comment: (NOTE) SARS-CoV-2 target  nucleic acids are NOT DETECTED.  The SARS-CoV-2 RNA is generally detectable in upper respiratory specimens during the acute phase of infection. The lowest concentration of SARS-CoV-2 viral copies this assay can detect is 138 copies/mL. A negative result does not preclude SARS-Cov-2 infection and should not be used as the sole basis for treatment or other patient management decisions. A negative result may occur with  improper specimen collection/handling, submission of specimen other than nasopharyngeal swab, presence of viral mutation(s) within the areas targeted by this assay, and inadequate number of viral copies(<138 copies/mL). A negative result must be combined with clinical observations, patient history, and epidemiological information. The expected result is Negative.  Fact Sheet for Patients:  BloggerCourse.comhttps://www.fda.gov/media/152166/download  Fact Sheet  for Healthcare Providers:  SeriousBroker.it  This test is no t yet approved or cleared by the [place] and  has been authorized for detection and/or diagnosis of SARS-CoV-2 by FDA under an Emergency Use Authorization (EUA). This EUA will remain  in effect (meaning this test can be used) for the duration of the COVID-19 declaration under Section 564(b)(1) of the Act, 21 U.S.C.section 360bbb-3(b)(1), unless the authorization is terminated  or revoked sooner.       Influenza A by PCR NEGATIVE NEGATIVE Final   Influenza B by PCR NEGATIVE NEGATIVE Final    Comment: (NOTE) The Xpert Xpress SARS-CoV-2/FLU/RSV plus assay is intended as an aid in the diagnosis of influenza from Nasopharyngeal swab specimens and should not be used as a sole basis for treatment. Nasal washings and aspirates are unacceptable for Xpert Xpress SARS-CoV-2/FLU/RSV testing.  Fact Sheet for Patients: BloggerCourse.com  Fact Sheet for Healthcare  Providers: SeriousBroker.it  This test is not yet approved or cleared by the [place] FDA and has been authorized for detection and/or diagnosis of SARS-CoV-2 by FDA under an Emergency Use Authorization (EUA). This EUA will remain in effect (meaning this test can be used) for the duration of the COVID-19 declaration under Section 564(b)(1) of the Act, 21 U.S.C. section 360bbb-3(b)(1), unless the authorization is terminated or revoked.  Performed at [facility], [address] [place], [place] [address]       Radiology Studies: CT ANGIO NECK W OR WO CONTRAST  Result Date: 06/15/2020 CLINICAL DATA:  Right-sided deficits and aphasia EXAM: CT ANGIOGRAPHY HEAD AND NECK TECHNIQUE: Multidetector CT imaging of the head and neck was performed using the standard protocol during bolus administration of intravenous contrast. Multiplanar CT image reconstructions and MIPs were obtained to evaluate the vascular anatomy. Carotid stenosis measurements (when applicable) are obtained utilizing NASCET criteria, using the distal internal carotid diameter as the denominator. CONTRAST:  75mL OMNIPAQUE IOHEXOL 350 MG/ML SOLN COMPARISON:  CTA head neck 05/31/2020 FINDINGS: CT HEAD FINDINGS Brain: Old infarcts of the left cerebellum and posterior left parietal lobe. No acute hemorrhage. The size and configuration of the ventricles and extra-axial CSF spaces are normal. There is hypoattenuation of the periventricular white matter, most commonly indicating chronic ischemic microangiopathy. Skull: The visualized skull base, calvarium and extracranial soft tissues are normal. Sinuses/Orbits: No fluid levels or advanced mucosal thickening of the visualized paranasal sinuses. No mastoid or middle ear effusion. The orbits are normal. CTA NECK FINDINGS SKELETON: There is no bony spinal canal stenosis. No lytic or blastic lesion. OTHER NECK: Normal pharynx, larynx and major salivary glands. No  cervical lymphadenopathy. Unremarkable thyroid gland. UPPER CHEST: No pneumothorax or pleural effusion. No nodules or masses. AORTIC ARCH: There is no calcific atherosclerosis of the aortic arch. There is no aneurysm, dissection or hemodynamically significant stenosis of the visualized portion of the aorta. Aberrant right subclavian artery. The visualized proximal subclavian arteries are widely patent. RIGHT CAROTID SYSTEM: Normal without aneurysm, dissection or stenosis. LEFT CAROTID SYSTEM: Normal without aneurysm, dissection or stenosis. VERTEBRAL ARTERIES: Left dominant configuration. Both origins are clearly patent. There is no dissection, occlusion or flow-limiting stenosis to the skull base (V1-V3 segments). CTA HEAD FINDINGS POSTERIOR CIRCULATION: --Vertebral arteries: Normal V4 segments. --Inferior cerebellar arteries: Normal. --Basilar artery: Normal. --Superior cerebellar arteries: Normal. --Posterior cerebral arteries (PCA): Mild bilateral multifocal stenoses are unchanged. ANTERIOR CIRCULATION: --Intracranial internal carotid arteries: Normal. --Anterior cerebral arteries (ACA): Unchanged area of severe stenosis of the left A2 segment --Middle cerebral  arteries (MCA): There are multifocal severe bilateral stenoses, unchanged. Stenosis are most severe at the distal M1 and proximal M2 segments. VENOUS SINUSES: As permitted by contrast timing, patent. ANATOMIC VARIANTS: None Review of the MIP images confirms the above findings. IMPRESSION: 1. No emergent large vessel occlusion. 2. Unchanged multifocal severe intracranial stenoses, including severe stenosis of the left anterior cerebral artery A2 segment and severe bilateral MCA distal M1 and proximal M2 stenoses. 3. 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 4. Aberrant right subclavian artery. Electronically Signed   By: Deatra Robinson M.D.   On: 06/15/2020 21:42   MR BRAIN WO CONTRAST  Result Date: 06/16/2020 CLINICAL DATA:  45 year old  male with difficulty speaking. Progressive multifocal cerebral infarcts since 04/20/2020. EXAM: MRI HEAD WITHOUT CONTRAST TECHNIQUE: Multiplanar, multiecho pulse sequences of the brain and surrounding structures were obtained without intravenous contrast. COMPARISON:  CTA head and neck 06/15/2020, brain MRI 06/04/2020 and earlier. FINDINGS: Brain: Since 06/04/2020, small new cortical infarcts of the left superior frontal gyrus pre motor area (series 5, image 93), increased infarct with restricted diffusion in the left cingulate gyrus (series 5, image 85)., progressed and more confluent right periatrial white matter infarct on image 81, and small new posterior right corona radiata infarct nearby on image 84. No significant mass effect. No malignant hemorrhagic transformation. Other widely scattered ischemic foci with abnormal diffusion in the MCA and left ACA territories were pre-existing. Deep gray nuclei, PCA territories, and posterior fossa relatively spared as before; small left cerebellar infarct seen last month has faded on diffusion series 5, image 67. Superimposed subacute or chronic infarcts in the left parietal lobe and left PICA territory with developing encephalomalacia. Scattered hemosiderin in both hemispheres appears stable on SWI. No midline shift, mass effect, evidence of mass lesion, ventriculomegaly. Cervicomedullary junction and pituitary are within normal limits. Vascular: Major intracranial vascular flow voids are stable with intracranial artery tortuosity. Skull and upper cervical spine: Stable. Visualized bone marrow signal is within normal limits. Sinuses/Orbits: Largely resolved paranasal sinus disease since last month. Orbits remain negative. Other: Trace right mastoid fluid is decreased. Grossly normal visible internal auditory structures. Visible scalp and face appear negative. IMPRESSION: 1. Once again several new or increased acute infarcts are demonstrated in both cerebral hemispheres  (today the bilateral MCA and left ACA territories) since the recent MRI 06/04/2020. No malignant hemorrhagic transformation or mass effect. 2. Stable or expected evolution of ischemia elsewhere, including developing encephalomalacia in the left parietal lobe and the left cerebellum (PICA) territories. 3. Improved paranasal sinus aeration since last month. Electronically Signed   By: Odessa Fleming M.D.   On: 06/16/2020 07:09   CT ANGIO HEAD CODE STROKE  Result Date: 06/15/2020 CLINICAL DATA:  Right-sided deficits and aphasia EXAM: CT ANGIOGRAPHY HEAD AND NECK TECHNIQUE: Multidetector CT imaging of the head and neck was performed using the standard protocol during bolus administration of intravenous contrast. Multiplanar CT image reconstructions and MIPs were obtained to evaluate the vascular anatomy. Carotid stenosis measurements (when applicable) are obtained utilizing NASCET criteria, using the distal internal carotid diameter as the denominator. CONTRAST:  16mL OMNIPAQUE IOHEXOL 350 MG/ML SOLN COMPARISON:  CTA head neck 05/31/2020 FINDINGS: CT HEAD FINDINGS Brain: Old infarcts of the left cerebellum and posterior left parietal lobe. No acute hemorrhage. The size and configuration of the ventricles and extra-axial CSF spaces are normal. There is hypoattenuation of the periventricular white matter, most commonly indicating chronic ischemic microangiopathy. Skull: The visualized skull base, calvarium and extracranial soft  tissues are normal. Sinuses/Orbits: No fluid levels or advanced mucosal thickening of the visualized paranasal sinuses. No mastoid or middle ear effusion. The orbits are normal. CTA NECK FINDINGS SKELETON: There is no bony spinal canal stenosis. No lytic or blastic lesion. OTHER NECK: Normal pharynx, larynx and major salivary glands. No cervical lymphadenopathy. Unremarkable thyroid gland. UPPER CHEST: No pneumothorax or pleural effusion. No nodules or masses. AORTIC ARCH: There is no calcific  atherosclerosis of the aortic arch. There is no aneurysm, dissection or hemodynamically significant stenosis of the visualized portion of the aorta. Aberrant right subclavian artery. The visualized proximal subclavian arteries are widely patent. RIGHT CAROTID SYSTEM: Normal without aneurysm, dissection or stenosis. LEFT CAROTID SYSTEM: Normal without aneurysm, dissection or stenosis. VERTEBRAL ARTERIES: Left dominant configuration. Both origins are clearly patent. There is no dissection, occlusion or flow-limiting stenosis to the skull base (V1-V3 segments). CTA HEAD FINDINGS POSTERIOR CIRCULATION: --Vertebral arteries: Normal V4 segments. --Inferior cerebellar arteries: Normal. --Basilar artery: Normal. --Superior cerebellar arteries: Normal. --Posterior cerebral arteries (PCA): Mild bilateral multifocal stenoses are unchanged. ANTERIOR CIRCULATION: --Intracranial internal carotid arteries: Normal. --Anterior cerebral arteries (ACA): Unchanged area of severe stenosis of the left A2 segment --Middle cerebral arteries (MCA): There are multifocal severe bilateral stenoses, unchanged. Stenosis are most severe at the distal M1 and proximal M2 segments. VENOUS SINUSES: As permitted by contrast timing, patent. ANATOMIC VARIANTS: None Review of the MIP images confirms the above findings. IMPRESSION: 1. No emergent large vessel occlusion. 2. Unchanged multifocal severe intracranial stenoses, including severe stenosis of the left anterior cerebral artery A2 segment and severe bilateral MCA distal M1 and proximal M2 stenoses. 3. 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 4. Aberrant right subclavian artery. Electronically Signed   By: Deatra Robinson M.D.   On: 06/15/2020 21:42      Scheduled Meds: . aspirin  300 mg Rectal Daily   Or  . aspirin  325 mg Oral Daily  . enoxaparin (LOVENOX) injection  40 mg Subcutaneous Q24H  . insulin aspart  0-9 Units Subcutaneous Q4H  . insulin detemir  10 Units  Subcutaneous QHS   Continuous Infusions: . lactated ringers Stopped (06/16/20 0200)  . levETIRAcetam Stopped (06/16/20 0040)  . magnesium sulfate bolus IVPB    . potassium chloride       LOS: 1 day      Time spent: 35 minutes   Noralee Stain, DO [facility] 06/16/2020, 11:41 AM   Available via Epic secure chat 7am-7pm After these hours, please refer to coverage provider listed on [email or web address]

## 2020-06-17 ENCOUNTER — Inpatient Hospital Stay (HOSPITAL_COMMUNITY): Payer: Medicaid Other

## 2020-06-17 ENCOUNTER — Encounter: Payer: Self-pay | Admitting: Occupational Therapy

## 2020-06-17 DIAGNOSIS — R569 Unspecified convulsions: Secondary | ICD-10-CM

## 2020-06-17 LAB — GLUCOSE, CAPILLARY
Glucose-Capillary: 80 mg/dL (ref 70–99)
Glucose-Capillary: 77 mg/dL (ref 70–99)
Glucose-Capillary: 82 mg/dL (ref 70–99)
Glucose-Capillary: 128 mg/dL — ABNORMAL HIGH (ref 70–99)
Glucose-Capillary: 105 mg/dL — ABNORMAL HIGH (ref 70–99)

## 2020-06-17 LAB — BASIC METABOLIC PANEL
Anion gap: 8 (ref 5–15)
BUN: 10 mg/dL (ref 6–20)
CO2: 30 mmol/L (ref 22–32)
Calcium: 8.8 mg/dL — ABNORMAL LOW (ref 8.9–10.3)
Chloride: 99 mmol/L (ref 98–111)
Creatinine, Ser: 1.16 mg/dL (ref 0.61–1.24)
GFR, Estimated: >60 mL/min (ref >60)
Glucose, Bld: 88 mg/dL (ref 70–99)
Potassium: 3.8 mmol/L (ref 3.5–5.1)
Sodium: 137 mmol/L (ref 135–145)

## 2020-06-17 LAB — SEDIMENTATION RATE: Sed Rate: 15 mm/hr (ref 0–16)

## 2020-06-17 LAB — C-REACTIVE PROTEIN: CRP: 0.8 mg/dL (ref <1.0)

## 2020-06-17 LAB — MAGNESIUM: Magnesium: 1.8 mg/dL (ref 1.7–2.4)

## 2020-06-17 MED ORDER — LEVETIRACETAM 500 MG PO TABS
500.0000 mg | ORAL_TABLET | Freq: Two times a day (BID) | ORAL | Status: DC
  Administered 2020-06-17: 500 mg via ORAL
  Filled 2020-06-17: qty 1

## 2020-06-17 MED ORDER — ASPIRIN EC 81 MG PO TBEC
81.0000 mg | DELAYED_RELEASE_TABLET | Freq: Every day | ORAL | Status: DC
  Administered 2020-06-18 – 2020-06-23 (×6): 81 mg via ORAL
  Filled 2020-06-17 (×6): qty 1

## 2020-06-17 MED ORDER — ATORVASTATIN CALCIUM 80 MG PO TABS
80.0000 mg | ORAL_TABLET | Freq: Every day | ORAL | Status: DC
  Administered 2020-06-18 – 2020-06-23 (×6): 80 mg via ORAL
  Filled 2020-06-17 (×6): qty 1

## 2020-06-17 MED ORDER — TICAGRELOR 90 MG PO TABS
90.0000 mg | ORAL_TABLET | Freq: Two times a day (BID) | ORAL | Status: DC
  Administered 2020-06-17 – 2020-06-23 (×12): 90 mg via ORAL
  Filled 2020-06-17 (×12): qty 1

## 2020-06-17 NOTE — Progress Notes (Signed)
vLTM EEG started following spot EEG. Notified Neuro 

## 2020-06-17 NOTE — Evaluation (Signed)
Clinical/Bedside Swallow Evaluation Patient Details  Name: Lavonte Palos MRN: [id number] Date of Birth: 04-Jun-1975  Today's Date: 06/17/2020 Time: SLP Start Time (ACUTE ONLY): 1020 SLP Stop Time (ACUTE ONLY): 1040 SLP Time Calculation (min) (ACUTE ONLY): 20 min  Past Medical History:  Past Medical History:  Diagnosis Date  . Hypertension   . Stroke [facility])    Past Surgical History:  Past Surgical History:  Procedure Laterality Date  . BUBBLE STUDY  06/01/2020   Procedure: BUBBLE STUDY;  Surgeon: Little Ishikawa, MD;  Location: [facility] ENDOSCOPY;  Service: Cardiovascular;;  . IR ANGIO INTRA EXTRACRAN SEL COM CAROTID INNOMINATE BILAT MOD SED  06/02/2020  . IR ANGIO VERTEBRAL SEL SUBCLAVIAN INNOMINATE BILAT MOD SED  06/02/2020  . TEE WITHOUT CARDIOVERSION N/A 06/01/2020   Procedure: TRANSESOPHAGEAL ECHOCARDIOGRAM (TEE);  Surgeon: Little Ishikawa, MD;  Location: [facility] ENDOSCOPY;  Service: Cardiovascular;  Laterality: N/A;   HPI:  Mr. Johnnell Liou is a 45 y.o. male with history of recent recurrent cryptogenic ischemic strokes 4/5-4/8, 5/15-19, repeat ER visit 5/19, 5/20-5/31 here and transferred to [facility],   multifocal severe intracranial stenoses, negative work up for vasculitis last week,  uncontrolled DM2, HLD, HTN, obesity and previous cocaine use. He was discharged and only home for a day when EMS was activated for the current episode of acute aphasia (alert but no verbal response at all) x 30 minutes. MRI once again several new or increased acute infarcts are  demonstrated in both cerebral hemispheres (today the bilateral MCA and left ACA territories) since the recent MRI 06/04/2020.   Assessment / Plan / Recommendation Clinical Impression  Patient presents with normal appearing sweallowing function without evidence of aspiration. No further SLP services needed for swallowing. SLP Visit Diagnosis: Dysphagia, unspecified (R13.10)       Diet Recommendation Regular;Thin liquid    Liquid Administration via: Cup;Straw Medication Administration: Whole meds with liquid Supervision: Patient able to self feed Compensations: Small sips/bites Postural Changes: Seated upright at 90 degrees    Other  Recommendations Oral Care Recommendations: Oral care BID     Swallow Study   General Date of Onset: 06/17/20 HPI: Mr. Tayven Renteria is a 45 y.o. male with history of recent recurrent cryptogenic ischemic strokes 4/5-4/8, 5/15-19, repeat ER visit 5/19, 5/20-5/31 here and transferred to [facility],   multifocal severe intracranial stenoses, negative work up for vasculitis last week,  uncontrolled DM2, HLD, HTN, obesity and previous cocaine use. He was discharged and only home for a day when EMS was activated for the current episode of acute aphasia (alert but no verbal response at all) x 30 minutes. MRI once again several new or increased acute infarcts are  demonstrated in both cerebral hemispheres (today the bilateral MCA and left ACA territories) since the recent MRI 06/04/2020. Type of Study: Bedside Swallow Evaluation Previous Swallow Assessment: none, no swallowing difficulty with prior cvas per caregiver [name] Diet Prior to this Study: NPO Temperature Spikes Noted: No Respiratory Status: Room air History of Recent Intubation: No Behavior/Cognition: Alert;Cooperative;Pleasant mood (aphasia) Oral Cavity Assessment: Within Functional Limits Oral Care Completed by SLP: Recent completion by staff Oral Cavity - Dentition: Adequate natural dentition Vision: Functional for self-feeding Self-Feeding Abilities: Able to feed self Patient Positioning: Upright in bed Baseline Vocal Quality: Normal Volitional Cough: Other (Comment) (unable to elicit due to aphasia) Volitional Swallow: Able to elicit    Oral/Motor/Sensory Function Overall Oral Motor/Sensory Function: Within functional limits   Ice Chips Ice chips: Within functional limits   Thin Liquid  Thin Liquid: Within  functional limits Presentation: Cup;Straw    Nectar Thick Nectar Thick Liquid: Not tested   Honey Thick Honey Thick Liquid: Not tested   Puree Puree: Within functional limits Presentation: Self Fed;Spoon   Solid     Solid: Within functional limits Presentation: Self Fed     [employer] MA, CCC-SLP   Dolora Ridgely Meryl 06/17/2020,11:14 AM

## 2020-06-17 NOTE — Progress Notes (Signed)
cEEG study started.  [facility] monitoring notified.  Patient event button tested.

## 2020-06-17 NOTE — Evaluation (Signed)
Speech Language Pathology Evaluation Patient Details Name: Nathaniel Spears MRN: [id number] DOB: [birth year] Today's Date: 06/17/2020 Time: 1040-1106 SLP Time Calculation (min) (ACUTE ONLY): 26 min  Problem List:  Patient Active Problem List   Diagnosis Date Noted  . Acute ischemic stroke (HCC) 06/05/2020  . Acute left-sided weakness 06/04/2020  . Nicotine dependence 06/04/2020  . Cocaine use 06/04/2020  . Aphasia   . Polysubstance abuse (HCC)   . Type 2 diabetes mellitus with hyperglycemia, with long-term current use of insulin (HCC)   . Hypokalemia   . Acute CVA (cerebrovascular accident) (HCC) 04/20/2020  . Essential hypertension 08/21/2019  . Obesity 08/21/2019   Past Medical History:  Past Medical History:  Diagnosis Date  . Hypertension   . Stroke [facility])    Past Surgical History:  Past Surgical History:  Procedure Laterality Date  . BUBBLE STUDY  06/01/2020   Procedure: BUBBLE STUDY;  Surgeon: Little Ishikawa, MD;  Location: [facility] ENDOSCOPY;  Service: Cardiovascular;;  . IR ANGIO INTRA EXTRACRAN SEL COM CAROTID INNOMINATE BILAT MOD SED  06/02/2020  . IR ANGIO VERTEBRAL SEL SUBCLAVIAN INNOMINATE BILAT MOD SED  06/02/2020  . TEE WITHOUT CARDIOVERSION N/A 06/01/2020   Procedure: TRANSESOPHAGEAL ECHOCARDIOGRAM (TEE);  Surgeon: Little Ishikawa, MD;  Location: [facility] ENDOSCOPY;  Service: Cardiovascular;  Laterality: N/A;   HPI:  Nathaniel Spears is a 45 y.o. male with history of recent recurrent cryptogenic ischemic strokes 4/5-4/8, 5/15-19, repeat ER visit 5/19, 5/20-5/31 here and transferred to [facility],   multifocal severe intracranial stenoses, negative work up for vasculitis last week,  uncontrolled DM2, HLD, HTN, obesity and previous cocaine use. He was discharged and only home for a day when EMS was activated for the current episode of acute aphasia (alert but no verbal response at all) x 30 minutes. MRI once again several new or increased acute infarcts are  demonstrated  in both cerebral hemispheres (today the bilateral MCA and left ACA territories) since the recent MRI 06/04/2020.   Assessment / Plan / Recommendation Clinical Impression  Pt with residual  aphasia following recent previous strokes. He was receiving outpatient SLP therapy prior to this admission. Pt continues to exhibit deficits in both receptive and expressive aphasia consistent with a moderate global aphasia. No apparent dysarthria of speech was exhibited and cognitive status appears WFL based on tasks assessed with good awareness of speech errors however severity of aphasia and inabiltiy to mobilize at this time make it difficulty to assess.  Pt with supportive significant otherat bedside. Ongoing ST services warranted to improve communication abilities. SLP to follow up.    SLP Assessment  SLP Recommendation/Assessment: Patient needs continued Speech Lanaguage Pathology Services SLP Visit Diagnosis: Dysphagia, unspecified (R13.10);Aphasia (R47.01)    Follow Up Recommendations  Inpatient Rehab    Frequency and Duration min 3x week  2 weeks      SLP Evaluation Cognition  Overall Cognitive Status: Difficult to assess (due to aphasia although appears appropriate per this SLP) Arousal/Alertness: Awake/alert Orientation Level: Oriented to person;Oriented to place;Disoriented to time;Disoriented to situation       Comprehension  Auditory Comprehension Overall Auditory Comprehension: Impaired Yes/No Questions: Impaired Basic Biographical Questions: 51-75% accurate Commands: Impaired One Step Basic Commands: 50-74% accurate Conversation: Simple Other Conversation Comments: impaired, improved with visual cueing Visual Recognition/Discrimination Discrimination: Exceptions to [facility] Common Objects: Unable to indentify Reading Comprehension Reading Status: Impaired Word level: Impaired    Expression Expression Primary Mode of Expression: Verbal Verbal Expression Overall Verbal  Expression: Impaired Initiation: Impaired  Automatic Speech: Name;Social Response Level of Generative/Spontaneous Verbalization: Phrase Repetition: Impaired Level of Impairment: Word level Naming: Impairment Common Objects: Unable to indentify Verbal Errors: Neologisms;Aware of errors;Semantic paraphasias;Phonemic paraphasias Pragmatics: No impairment Interfering Components: Premorbid deficit Written Expression Dominant Hand: Left Written Expression: Exceptions to [facility] Dictation Ability: Word   Oral / Motor  Oral Motor/Sensory Function Overall Oral Motor/Sensory Function: Within functional limits Motor Speech Overall Motor Speech: Appears within functional limits for tasks assessed   GO            Ferdinand Lango MA, CCC-SLP          Levie Wages Meryl 06/17/2020, 11:20 AM

## 2020-06-17 NOTE — Evaluation (Signed)
Physical Therapy Evaluation Patient Details Name: Nathaniel Spears MRN: [id number] DOB: [birth year] Today's Date: 06/17/2020   History of Present Illness  Mr. Hinshaw is a 45 yo male admitted on 06/15/2020 with c/o aphasia,  with multiple recent admits for repeated strokes. Pt most recently admitted 5/20-5/23 for acute strokes before being transferred at family request to [facility] for further work-up. While in the ED, pt had worsening aphasia compared to baseline. MRI reveals several new or increased acute infarcts are  demonstrated in both cerebral hemispheres (today the bilateral MCA  and left ACA territories) since the recent MRI 06/04/2020. PMH includes HTN, DM2, HLD, multiple recent admits for repeated strokes.  Clinical Impression  Pt presents to PT with deficits in functional mobility, gait, balance, power, sensation, communication, cognition, motor planning. Pt with significant deficits in R sided sensation and motor planning. Pt demonstrates good strength in R side, but has difficulty initiating and directing mobility with this side specifically. Pt is unsteady and is at an increased risk of falling due to these motor planning deficits. Pt will benefit from aggressive mobilization and acute PT POC to improve gait quality and reduce falls risk. PT recommends CIR placement at the time of discharge as the pt was mobilizing independently in the home prior to this admission and demonstrates the potential to return to a modI or supervision level of mobility.    Follow Up Recommendations CIR    Equipment Recommendations   (TBD, could benefit from assistive device pending further gait assessment)    Recommendations for Other Services Rehab consult     Precautions / Restrictions Precautions Precautions: Fall Precaution Comments: expressive and receptive aphasia Restrictions Weight Bearing Restrictions: No      Mobility  Bed Mobility Overal bed mobility: Needs Assistance Bed Mobility: Supine to  Sit;Sit to Supine     Supine to sit: Mod assist;HOB elevated Sit to supine: Mod assist   General bed mobility comments: pt with difficulty sequencing R side, requiring physical assistance to mobilize RLE toward edge of bed and to pivot hips to edge of bed    Transfers Overall transfer level: Needs assistance Equipment used: 1 person hand held assist Transfers: Sit to/from Stand Sit to Stand: Min assist         General transfer comment: cues for anterior trunk lean, pt rocking for momentum (unsuccessful on multiple attempts prior to finally standing)  Ambulation/Gait Ambulation/Gait assistance: Min assist Gait Distance (Feet): 15 Feet Assistive device: 1 person hand held assist Gait Pattern/deviations: Step-to pattern Gait velocity: reduced Gait velocity interpretation: <1.8 ft/sec, indicate of risk for recurrent falls General Gait Details: pt with slowed step-to gait, increased lateral sway and widened BOS, gait distance limited by attempts to remain on EEG video monitoring. Pt requires verbal/visual/tactile cues to direct and sequence gait  Stairs            Wheelchair Mobility    Modified Rankin (Stroke Patients Only) Modified Rankin (Stroke Patients Only) Pre-Morbid Rankin Score: Moderate disability Modified Rankin: Moderately severe disability     Balance Overall balance assessment: Needs assistance Sitting-balance support: No upper extremity supported;Feet supported Sitting balance-Leahy Scale: Good     Standing balance support: No upper extremity supported Standing balance-Leahy Scale: Fair                               Pertinent Vitals/Pain Pain Assessment: No/denies pain    Home Living Family/patient expects to be discharged to:: Private  residence Living Arrangements: Spouse/significant other Available Help at Discharge: Family;Available 24 hours/day Type of Home: Apartment Home Access: Stairs to enter Entrance Stairs-Rails:  None Entrance Stairs-Number of Steps: 6 Home Layout: Two level Home Equipment: None (needs confirmation next session) Additional Comments: wife at home recently, denied FMLA approval after recent medical events.    Prior Function Level of Independence: Needs assistance   Gait / Transfers Assistance Needed: pt was independent prior to april, mobilizing independently to bathroom and up stairs since last admission per spouse  ADL's / Homemaking Assistance Needed: requiring supervision ADL/IADLs including cooking and bathing since last admission in May  Comments: Prior to initial CVA in April 2022 pt was independent in all tasks     Hand Dominance   Dominant Hand: Left    Extremity/Trunk Assessment   Upper Extremity Assessment Upper Extremity Assessment: RUE deficits/detail RUE Deficits / Details: ROM WFL, strength at least 4/5 grossly. Motor planning deficits seem present, difficulty initiating mobility with RUE. Sensation assessment omitted LUE Deficits / Details: strength and ROM appear WFL although not formally assessed    Lower Extremity Assessment Lower Extremity Assessment: RLE deficits/detail;LLE deficits/detail RLE Deficits / Details: 4/5 gross RLE strength, ROM WFL, motor planning deficits noted with difficulty initiating and sequencing bed mobility with RLE. RLE Sensation: decreased light touch RLE Coordination: decreased gross motor (difficult to accurately discern with aphasia present) LLE Deficits / Details: 4+/5 strength grossly, ROM WFL LLE Sensation: WNL LLE Coordination: decreased gross motor (difficult to accurately discern with aphasia present)    Cervical / Trunk Assessment Cervical / Trunk Assessment: Normal  Communication   Communication: Expressive difficulties;Receptive difficulties  Cognition Arousal/Alertness: Awake/alert Behavior During Therapy: WFL for tasks assessed/performed Overall Cognitive Status: Difficult to assess Area of Impairment:  Orientation;Attention;Memory;Following commands;Safety/judgement;Awareness;Problem solving                 Orientation Level: Disoriented to;Time Current Attention Level: Focused Memory: Decreased recall of precautions Following Commands: Follows one step commands with increased time;Follows multi-step commands inconsistently (pt requires multi-modal cues including verbal, tactile and visual cueing) Safety/Judgement: Decreased awareness of safety;Decreased awareness of deficits   Problem Solving: Slow processing;Requires verbal cues;Requires tactile cues;Difficulty sequencing;Decreased initiation        General Comments General comments (skin integrity, edema, etc.): VSS on RA    Exercises     Assessment/Plan    PT Assessment Patient needs continued PT services  PT Problem List Decreased strength;Decreased activity tolerance;Decreased balance;Decreased mobility;Decreased coordination;Decreased knowledge of use of DME;Decreased cognition;Decreased safety awareness;Decreased knowledge of precautions;Impaired sensation       PT Treatment Interventions DME instruction;Gait training;Stair training;Functional mobility training;Therapeutic activities;Therapeutic exercise;Balance training;Neuromuscular re-education;Patient/family education;Cognitive remediation    PT Goals (Current goals can be found in the Care Plan section)  Acute Rehab PT Goals Patient Stated Goal: to go to rehab and regain independence in mobility PT Goal Formulation: With patient/family Time For Goal Achievement: 07/01/20 Potential to Achieve Goals: Good    Frequency Min 4X/week   Barriers to discharge        Co-evaluation               AM-PAC PT "6 Clicks" Mobility  Outcome Measure Help needed turning from your back to your side while in a flat bed without using bedrails?: A Little Help needed moving from lying on your back to sitting on the side of a flat bed without using bedrails?: A  Lot Help needed moving to and from a bed to a chair (including a  wheelchair)?: A Little Help needed standing up from a chair using your arms (e.g., wheelchair or bedside chair)?: A Little Help needed to walk in hospital room?: A Little Help needed climbing 3-5 steps with a railing? : A Lot 6 Click Score: 16    End of Session   Activity Tolerance: Patient tolerated treatment well Patient left: in bed;with call bell/phone within reach;with bed alarm set;with family/visitor present Nurse Communication: Mobility status PT Visit Diagnosis: Unsteadiness on feet (R26.81);Other abnormalities of gait and mobility (R26.89);Muscle weakness (generalized) (M62.81);Difficulty in walking, not elsewhere classified (R26.2);Apraxia (R48.2)    Time: 3646-8032 PT Time Calculation (min) (ACUTE ONLY): 27 min   Charges:   PT Evaluation $PT Eval Moderate Complexity: 1 Mod          Arlyss Gandy, PT, DPT Acute Rehabilitation Pager: [phone]   Arlyss Gandy 06/17/2020, 12:40 PM

## 2020-06-17 NOTE — Progress Notes (Signed)
EEG complete - results pending 

## 2020-06-17 NOTE — Progress Notes (Signed)
Inpatient Rehab Admissions Coordinator Note:   Per therapy recommendations, pt was screened for CIR candidacy by Estill Dooms, PT, DPT.  At this time we are recommending a CIR consult and I will place an order per our protocol.  Please contact me with questions.   Estill Dooms, PT, DPT [phone] 06/17/20 4:44 PM

## 2020-06-17 NOTE — Procedures (Signed)
Patient Name: Nathaniel Spears  MRN: [id number]  Epilepsy Attending: Charlsie Quest  Referring Physician/Provider: Shon Hale, NP Date: 06/18/2018 Duration: 26.40 minutes  Patient history: 45 year old male with multiple strokes and seizure-like activity.  EEG to evaluate for seizures.  Level of alertness: Awake  AEDs during EEG study: Keppra  Technical aspects: This EEG study was done with scalp electrodes positioned according to the 10-20 International system of electrode placement. Electrical activity was acquired at a sampling rate of 500Hz  and reviewed with a high frequency filter of 70Hz  and a low frequency filter of 1Hz . EEG data were recorded continuously and digitally stored.   Description: No clear posterior dominant rhythm was seen. EEG showed continuous generalized 3 to 6 Hz theta-delta slowing as well as intermittent generalized and lateralized right hemisphere rhythmic 2 to 3 Hz delta slowing.  Hyperventilation and photic stimulation were not performed.     ABNORMALITY - Continuous slow, generalized -Intermittent rhythmic delta slowing, generalized and lateralized right hemisphere  IMPRESSION: This study is suggestive of cortical dysfunction in right hemisphere likely secondary to underlying stroke. Additionally, there is moderate diffuse encephalopathy, non specific etiology. No seizures or epileptiform discharges were seen throughout the recording.  Nathaniel Spears 

## 2020-06-17 NOTE — Evaluation (Signed)
Occupational Therapy Evaluation Patient Details Name: Nathaniel Spears MRN: [id number] DOB: [birth year] Today's Date: 06/17/2020    History of Present Illness Nathaniel Spears is a 45 yo male admitted on 06/15/2020 with c/o aphasia,  with multiple recent admits for repeated strokes. Pt most recently admitted 5/20-5/23 for acute strokes before being transferred at family request to [facility] for further work-up. While in the ED, pt had worsening aphasia compared to baseline. MRI reveals several new or increased acute infarcts are  demonstrated in both cerebral hemispheres (today the bilateral MCA  and left ACA territories) since the recent MRI 06/04/2020. PMH includes HTN, DM2, HLD, multiple recent admits for repeated strokes.   Clinical Impression   Patient admitted for the diagnosis above.  PTA he was living in an apartment with his significant other needing largely oversight and cueing for cognitive and R inattention, for which he was receiving outpatient OT for.  His significant other, per the chart, assisted with meds and IADLs.  Barriers are listed below.  Currently he is needing up to Mod A for basic mobility, and ADL/OOB to be further tested.  OT will continue efforts in the acute setting to maximize functional status.  CIR has been recommended.      Follow Up Recommendations  CIR    Equipment Recommendations  Tub/shower seat;3 in 1 bedside commode    Recommendations for Other Services       Precautions / Restrictions Precautions Precautions: Fall Precaution Comments: expressive and receptive aphasia Restrictions Weight Bearing Restrictions: No Other Position/Activity Restrictions: currently on continuous EEG      Mobility Bed Mobility Overal bed mobility: Needs Assistance Bed Mobility: Rolling Rolling: Mod assist         General bed mobility comments: increased time and tactile cueing. Patient Response: Flat affect  Transfers                 General transfer comment: was told  not to get patient OOB during EEG    Balance                                           ADL either performed or assessed with clinical judgement   ADL                                         General ADL Comments: ADL testing ongoing     Vision Patient Visual Report: Other (comment) Additional Comments: Being seen outpatient for R inattention versus field cut.  Did track my finger to R upper quadrant times one.     Perception     Praxis      Pertinent Vitals/Pain Pain Assessment: Faces Faces Pain Scale: No hurt Pain Intervention(s): Monitored during session     Hand Dominance Left   Extremity/Trunk Assessment Upper Extremity Assessment Upper Extremity Assessment: Generalized weakness;RUE deficits/detail RUE Deficits / Details: difficulty with motor planning and transitioning from one fine motor activity to the next. LUE Deficits / Details: Patient is spontaneously moving both UE's, difficulty following MMT instructions.   Lower Extremity Assessment Lower Extremity Assessment: Defer to PT evaluation       Communication Communication Communication: Expressive difficulties;Receptive difficulties   Cognition Arousal/Alertness: Awake/alert Behavior During Therapy: WFL for tasks assessed/performed Overall Cognitive Status: No family/caregiver present to determine baseline  cognitive functioning                     Current Attention Level: Focused   Following Commands: Follows one step commands inconsistently;Follows one step commands with increased time     Problem Solving: Difficulty sequencing;Requires verbal cues;Requires tactile cues;Slow processing General Comments: Patient being seen for outpatient OT for:  Attention and concentration deficit, Hemiplegia and hemiparesis following cerebral infarction affecting right non-dominant side, Visuospatial deficit (inattention to the R versus field cut), Frontal lobe and executive  function deficit.   General Comments       Exercises     Shoulder Instructions      Home Living Family/patient expects to be discharged to:: Private residence Living Arrangements: Spouse/significant other Available Help at Discharge: Family;Available 24 hours/day Type of Home: Apartment Home Access: Stairs to enter Entrance Stairs-Number of Steps: 6 Entrance Stairs-Rails: None Home Layout: Two level Alternate Level Stairs-Number of Steps: flight Alternate Level Stairs-Rails: Right Bathroom Shower/Tub: Chief Strategy Officer: Standard     Home Equipment: None   Additional Comments: wife at home recently, denied FMLA approval after recent medical events.  Lives With: Significant other    Prior Functioning/Environment Level of Independence: Needs assistance  Gait / Transfers Assistance Needed: pt was independent prior to april, mobilizing independently to bathroom and up stairs since last admission per spouse ADL's / Homemaking Assistance Needed: requiring supervision ADL/IADLs including cooking and bathing since last admission in May Communication / Swallowing Assistance Needed: seeing speech therapy for follow-up on aphasia since last admission in May 2022 Comments: Informantion taken from chart, patient is unable to describe, and no family in the room.        OT Problem List: Decreased strength;Decreased activity tolerance;Decreased coordination;Decreased cognition;Impaired vision/perception;Impaired balance (sitting and/or standing);Decreased safety awareness      OT Treatment/Interventions: Self-care/ADL training;Therapeutic exercise;DME and/or AE instruction;Therapeutic activities;Patient/family education;Balance training    OT Goals(Current goals can be found in the care plan section) Acute Rehab OT Goals Patient Stated Goal: not stated OT Goal Formulation: Patient unable to participate in goal setting Time For Goal Achievement: 07/01/20 Potential to  Achieve Goals: Good ADL Goals Pt Will Perform Grooming: with supervision;sitting Pt Will Perform Upper Body Bathing: with supervision;sitting Pt Will Perform Upper Body Dressing: with supervision;sitting Pt Will Transfer to Toilet: with min guard assist;ambulating;regular height toilet Pt Will Perform Toileting - Clothing Manipulation and hygiene: with min guard assist;sitting/lateral leans  OT Frequency: Min 2X/week   Barriers to D/C:    none noted       Co-evaluation              AM-PAC OT "6 Clicks" Daily Activity     Outcome Measure Help from another person eating meals?: A Lot Help from another person taking care of personal grooming?: A Lot Help from another person toileting, which includes using toliet, bedpan, or urinal?: A Lot Help from another person bathing (including washing, rinsing, drying)?: A Lot Help from another person to put on and taking off regular upper body clothing?: A Lot Help from another person to put on and taking off regular lower body clothing?: A Lot 6 Click Score: 12   End of Session    Activity Tolerance: Patient tolerated treatment well Patient left: in bed;with call bell/phone within reach;with bed alarm set;with nursing/sitter in room  OT Visit Diagnosis: Muscle weakness (generalized) (M62.81);Other symptoms and signs involving cognitive function;Cognitive communication deficit (R41.841) Symptoms and signs involving cognitive functions: Cerebral  infarction                Time: 4709-6283 OT Time Calculation (min): 17 min Charges:  OT General Charges $OT Visit: 1 Visit OT Evaluation $OT Eval Moderate Complexity: 1 Mod  06/17/2020  Rich, OTR/L  Acute Rehabilitation Services  Office:  [phone]   Suzanna Obey 06/17/2020, 4:13 PM

## 2020-06-17 NOTE — Progress Notes (Signed)
PROGRESS NOTE    Nathaniel Spears  EGB:[id number] DOB: January 15, 1976 DOA: 06/15/2020 PCP: Grayce Sessions, NP     Brief Narrative:  Nathaniel Spears is a 45 year old male with past medical history significant for hypertension, diabetes, hyperlipidemia who presented to the hospital with chief complaint of aphasia.  Patient has been hospitalized for recurrent stroke since 04/20/2020.  Patient was admitted from 5/22 5/23 for acute strokes, transfer to [facility] for further neurologic work-up.  Work-up for CNS vasculitis was negative.  He now returns back to the emergency department with complaints of aphasia.  Neurology consulted.   New events last 24 hours / Subjective: Patient with continued aphasia. No acute events overnights. Able to follow most commands, but not all. EEG getting set up.   Assessment & Plan:   Principal Problem:   Acute ischemic stroke [facility]) Active Problems:   Essential hypertension   Type 2 diabetes mellitus with hyperglycemia, with long-term current use of insulin (HCC)   Recurrent stroke -MRI brain showed several new or increased acute infarcts in both cerebral hemispheres -CTA head and neck: No emergent large vessel occlusion, unchanged multifocal severe intracranial stenosis -Neurology following -Aspirin/plavix PTA, statin. Neurology may switch to aspirin/brilinta going forward  -Keppra (started for seizure prophylaxis in May) -PT OT SLP -EEG pending   Hypertension -Allow permissive hypertension in setting of acute stroke  Diabetes mellitus type 2 -Continue Levemir, sliding scale insulin     DVT prophylaxis:  enoxaparin (LOVENOX) injection 40 mg Start: 06/16/20 1000  Code Status:     Code Status Orders  (From admission, onward)         Start     Ordered   06/15/20 2305  Full code  Continuous        06/15/20 2308        Code Status History    Date Active Date Inactive Code Status Order ID Comments User Context   06/04/2020 2222 06/07/2020 0623  Full Code [id number]  Marinda Elk, MD Inpatient   05/30/2020 2103 06/03/2020 1524 Full Code [id number]  Lewie Chamber, MD ED   04/20/2020 1922 04/23/2020 1909 Full Code [id number]  Charlsie Quest, MD ED   Advance Care Planning Activity     Family Communication: No family at bedside Disposition Plan:  Status is: Inpatient  Remains inpatient appropriate because:Ongoing diagnostic testing needed not appropriate for outpatient work up   Dispo: The patient is from: Home              Anticipated d/c is to: SNF vs CIR               Patient currently is not medically stable to d/c.   Difficult to place patient No      Consultants:   Neurology  Procedures:   None   Antimicrobials:  Anti-infectives (From admission, onward)   None       Objective: Vitals:   06/16/20 1951 06/16/20 2346 06/17/20 0428 06/17/20 1118  BP: (!) 123/91 (!) 135/95 (!) 147/110 (!) 143/105  Pulse: 84 80 75 79  Resp: 18 18 19 18   Temp: 98.5 F (36.9 C) 98.2 F (36.8 C) 97.8 F (36.6 C) 98.6 F (37 C)  TempSrc: Oral Oral Axillary Oral  SpO2: 100% 100% 100% 98%  Weight:      Height:        Intake/Output Summary (Last 24 hours) at 06/17/2020 1147 Last data filed at 06/16/2020 1316 Gross per 24 hour  Intake 150 ml  Output --  Net 150 ml   Filed Weights   06/15/20 2146  Weight: 115 kg    Examination: General exam: Appears calm and comfortable  Respiratory system: Clear to auscultation. Respiratory effort normal. Cardiovascular system: S1 & S2 heard, RRR. No pedal edema. Gastrointestinal system: Abdomen is nondistended, soft and nontender. Normal bowel sounds heard. Central nervous system: Alert, +expressive aphasia, left facial droop present, able to move all extremities   Extremities: Symmetric in appearance bilaterally  Skin: No rashes, lesions or ulcers on exposed skin  Psychiatry: Stable    Data Reviewed: I have personally reviewed following labs and imaging studies  CBC: Recent  Labs  Lab 06/15/20 2052 06/15/20 2058  WBC 8.9  --   NEUTROABS 4.2  --   HGB 12.9* 13.6  HCT 40.1 40.0  MCV 80.4  --   PLT 334  --    Basic Metabolic Panel: Recent Labs  Lab 06/15/20 2052 06/15/20 2058 06/16/20 0735 06/17/20 0428  NA 138 139 139 137  K 3.4* 3.4* 2.5* 3.8  CL 99 98 110 99  CO2 29  --  22 30  GLUCOSE 109* 108* 85 88  BUN CREATININE 1.43* 1.50* 0.88 1.16  CALCIUM 9.1  --  6.8* 8.8*  MG  --   --  1.4* 1.8   GFR: Estimated Creatinine Clearance: 105.3 mL/min (by C-G formula based on SCr of 1.16 mg/dL). Liver Function Tests: Recent Labs  Lab 06/15/20 2052  AST 12*  ALT 15  ALKPHOS 57  BILITOT 0.8  PROT 6.7  ALBUMIN 3.5   No results for input(s): LIPASE, AMYLASE in the last 168 hours. No results for input(s): AMMONIA in the last 168 hours. Coagulation Profile: Recent Labs  Lab 06/15/20 2052  INR 1.1   Cardiac Enzymes: No results for input(s): CKTOTAL, CKMB, CKMBINDEX, TROPONINI in the last 168 hours. BNP (last 3 results) No results for input(s): PROBNP in the last 8760 hours. HbA1C: No results for input(s): HGBA1C in the last 72 hours. CBG: Recent Labs  Lab 06/16/20 1658 06/16/20 1812 06/16/20 2336 06/17/20 0503 06/17/20 0801  GLUCAP 78 79 80 80 77   Lipid Profile: No results for input(s): CHOL, HDL, LDLCALC, TRIG, CHOLHDL, LDLDIRECT in the last 72 hours. Thyroid Function Tests: No results for input(s): TSH, T4TOTAL, FREET4, T3FREE, THYROIDAB in the last 72 hours. Anemia Panel: No results for input(s): VITAMINB12, FOLATE, FERRITIN, TIBC, IRON, RETICCTPCT in the last 72 hours. Sepsis Labs: No results for input(s): PROCALCITON, LATICACIDVEN in the last 168 hours.  Recent Results (from the past 240 hour(s))  Resp Panel by RT-PCR (Flu A&B, Covid) Nasopharyngeal Swab     Status: None   Collection Time: 06/15/20 10:26 PM   Specimen: Nasopharyngeal Swab; Nasopharyngeal(NP) swabs in vial transport medium  Result Value Ref  Range Status   SARS Coronavirus 2 by RT PCR NEGATIVE NEGATIVE Final    Comment: (NOTE) SARS-CoV-2 target nucleic acids are NOT DETECTED.  The SARS-CoV-2 RNA is generally detectable in upper respiratory specimens during the acute phase of infection. The lowest concentration of SARS-CoV-2 viral copies this assay can detect is 138 copies/mL. A negative result does not preclude SARS-Cov-2 infection and should not be used as the sole basis for treatment or other patient management decisions. A negative result may occur with  improper specimen collection/handling, submission of specimen other than nasopharyngeal swab, presence of viral mutation(s) within the areas targeted by this assay, and inadequate number of viral copies(<138 copies/mL). A negative result  must be combined with clinical observations, patient history, and epidemiological information. The expected result is Negative.  Fact Sheet for Patients:  BloggerCourse.com  Fact Sheet for Healthcare Providers:  SeriousBroker.it  This test is no t yet approved or cleared by the [place] FDA and  has been authorized for detection and/or diagnosis of SARS-CoV-2 by FDA under an Emergency Use Authorization (EUA). This EUA will remain  in effect (meaning this test can be used) for the duration of the COVID-19 declaration under Section 564(b)(1) of the Act, 21 U.S.C.section 360bbb-3(b)(1), unless the authorization is terminated  or revoked sooner.       Influenza A by PCR NEGATIVE NEGATIVE Final   Influenza B by PCR NEGATIVE NEGATIVE Final    Comment: (NOTE) The Xpert Xpress SARS-CoV-2/FLU/RSV plus assay is intended as an aid in the diagnosis of influenza from Nasopharyngeal swab specimens and should not be used as a sole basis for treatment. Nasal washings and aspirates are unacceptable for Xpert Xpress SARS-CoV-2/FLU/RSV testing.  Fact Sheet for  Patients: BloggerCourse.com  Fact Sheet for Healthcare Providers: SeriousBroker.it  This test is not yet approved or cleared by the [place] FDA and has been authorized for detection and/or diagnosis of SARS-CoV-2 by FDA under an Emergency Use Authorization (EUA). This EUA will remain in effect (meaning this test can be used) for the duration of the COVID-19 declaration under Section 564(b)(1) of the Act, 21 U.S.C. section 360bbb-3(b)(1), unless the authorization is terminated or revoked.  Performed at [facility], [address], [place], [place] [address]       Radiology Studies: CT ANGIO NECK W OR WO CONTRAST  Result Date: 06/15/2020 CLINICAL DATA:  Right-sided deficits and aphasia EXAM: CT ANGIOGRAPHY HEAD AND NECK TECHNIQUE: Multidetector CT imaging of the head and neck was performed using the standard protocol during bolus administration of intravenous contrast. Multiplanar CT image reconstructions and MIPs were obtained to evaluate the vascular anatomy. Carotid stenosis measurements (when applicable) are obtained utilizing NASCET criteria, using the distal internal carotid diameter as the denominator. CONTRAST:  15mL OMNIPAQUE IOHEXOL 350 MG/ML SOLN COMPARISON:  CTA head neck 05/31/2020 FINDINGS: CT HEAD FINDINGS Brain: Old infarcts of the left cerebellum and posterior left parietal lobe. No acute hemorrhage. The size and configuration of the ventricles and extra-axial CSF spaces are normal. There is hypoattenuation of the periventricular white matter, most commonly indicating chronic ischemic microangiopathy. Skull: The visualized skull base, calvarium and extracranial soft tissues are normal. Sinuses/Orbits: No fluid levels or advanced mucosal thickening of the visualized paranasal sinuses. No mastoid or middle ear effusion. The orbits are normal. CTA NECK FINDINGS SKELETON: There is no bony spinal canal stenosis. No lytic  or blastic lesion. OTHER NECK: Normal pharynx, larynx and major salivary glands. No cervical lymphadenopathy. Unremarkable thyroid gland. UPPER CHEST: No pneumothorax or pleural effusion. No nodules or masses. AORTIC ARCH: There is no calcific atherosclerosis of the aortic arch. There is no aneurysm, dissection or hemodynamically significant stenosis of the visualized portion of the aorta. Aberrant right subclavian artery. The visualized proximal subclavian arteries are widely patent. RIGHT CAROTID SYSTEM: Normal without aneurysm, dissection or stenosis. LEFT CAROTID SYSTEM: Normal without aneurysm, dissection or stenosis. VERTEBRAL ARTERIES: Left dominant configuration. Both origins are clearly patent. There is no dissection, occlusion or flow-limiting stenosis to the skull base (V1-V3 segments). CTA HEAD FINDINGS POSTERIOR CIRCULATION: --Vertebral arteries: Normal V4 segments. --Inferior cerebellar arteries: Normal. --Basilar artery: Normal. --Superior cerebellar arteries: Normal. --Posterior cerebral arteries (PCA): Mild bilateral multifocal  stenoses are unchanged. ANTERIOR CIRCULATION: --Intracranial internal carotid arteries: Normal. --Anterior cerebral arteries (ACA): Unchanged area of severe stenosis of the left A2 segment --Middle cerebral arteries (MCA): There are multifocal severe bilateral stenoses, unchanged. Stenosis are most severe at the distal M1 and proximal M2 segments. VENOUS SINUSES: As permitted by contrast timing, patent. ANATOMIC VARIANTS: None Review of the MIP images confirms the above findings. IMPRESSION: 1. No emergent large vessel occlusion. 2. Unchanged multifocal severe intracranial stenoses, including severe stenosis of the left anterior cerebral artery A2 segment and severe bilateral MCA distal M1 and proximal M2 stenoses. 3. 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 4. Aberrant right subclavian artery. Electronically Signed   By: Deatra Robinson M.D.   On: 06/15/2020  21:42   MR BRAIN WO CONTRAST  Result Date: 06/16/2020 CLINICAL DATA:  45 year old male with difficulty speaking. Progressive multifocal cerebral infarcts since 04/20/2020. EXAM: MRI HEAD WITHOUT CONTRAST TECHNIQUE: Multiplanar, multiecho pulse sequences of the brain and surrounding structures were obtained without intravenous contrast. COMPARISON:  CTA head and neck 06/15/2020, brain MRI 06/04/2020 and earlier. FINDINGS: Brain: Since 06/04/2020, small new cortical infarcts of the left superior frontal gyrus pre motor area (series 5, image 93), increased infarct with restricted diffusion in the left cingulate gyrus (series 5, image 85)., progressed and more confluent right periatrial white matter infarct on image 81, and small new posterior right corona radiata infarct nearby on image 84. No significant mass effect. No malignant hemorrhagic transformation. Other widely scattered ischemic foci with abnormal diffusion in the MCA and left ACA territories were pre-existing. Deep gray nuclei, PCA territories, and posterior fossa relatively spared as before; small left cerebellar infarct seen last month has faded on diffusion series 5, image 67. Superimposed subacute or chronic infarcts in the left parietal lobe and left PICA territory with developing encephalomalacia. Scattered hemosiderin in both hemispheres appears stable on SWI. No midline shift, mass effect, evidence of mass lesion, ventriculomegaly. Cervicomedullary junction and pituitary are within normal limits. Vascular: Major intracranial vascular flow voids are stable with intracranial artery tortuosity. Skull and upper cervical spine: Stable. Visualized bone marrow signal is within normal limits. Sinuses/Orbits: Largely resolved paranasal sinus disease since last month. Orbits remain negative. Other: Trace right mastoid fluid is decreased. Grossly normal visible internal auditory structures. Visible scalp and face appear negative. IMPRESSION: 1. Once again  several new or increased acute infarcts are demonstrated in both cerebral hemispheres (today the bilateral MCA and left ACA territories) since the recent MRI 06/04/2020. No malignant hemorrhagic transformation or mass effect. 2. Stable or expected evolution of ischemia elsewhere, including developing encephalomalacia in the left parietal lobe and the left cerebellum (PICA) territories. 3. Improved paranasal sinus aeration since last month. Electronically Signed   By: Odessa Fleming M.D.   On: 06/16/2020 07:09   EEG adult  Result Date: 06/17/2020 Charlsie Quest, MD     06/17/2020 10:22 AM Patient Name: Adreyan Carbajal MRN: [id number] Epilepsy Attending: Charlsie Quest Referring Physician/Provider: Shon Hale, NP Date: 06/18/2018 Duration: 26.40 minutes Patient history: 45 year old male with multiple strokes and seizure-like activity.  EEG to evaluate for seizures. Level of alertness: Awake AEDs during EEG study: Keppra Technical aspects: This EEG study was done with scalp electrodes positioned according to the 10-20 International system of electrode placement. Electrical activity was acquired at a sampling rate of  and reviewed with a high frequency filter of  and a low frequency filter of . EEG data were recorded continuously and digitally stored. Description:  No clear posterior dominant rhythm was seen. EEG showed continuous generalized 3 to 6 Hz theta-delta slowing as well as intermittent generalized and lateralized right hemisphere rhythmic 2 to 3 Hz delta slowing.  Hyperventilation and photic stimulation were not performed.   ABNORMALITY - Continuous slow, generalized -Intermittent rhythmic delta slowing, generalized and lateralized right hemisphere IMPRESSION: This study is suggestive of cortical dysfunction in right hemisphere likely secondary to underlying stroke. Additionally, there is moderate diffuse encephalopathy, non specific etiology. No seizures or epileptiform discharges were seen  throughout the recording. Priyanka Annabelle Harman Yadav   CT ANGIO HEAD CODE STROKE  Result Date: 06/15/2020 CLINICAL DATA:  Right-sided deficits and aphasia EXAM: CT ANGIOGRAPHY HEAD AND NECK TECHNIQUE: Multidetector CT imaging of the head and neck was performed using the standard protocol during bolus administration of intravenous contrast. Multiplanar CT image reconstructions and MIPs were obtained to evaluate the vascular anatomy. Carotid stenosis measurements (when applicable) are obtained utilizing NASCET criteria, using the distal internal carotid diameter as the denominator. CONTRAST:  75mL OMNIPAQUE IOHEXOL 350 MG/ML SOLN COMPARISON:  CTA head neck 05/31/2020 FINDINGS: CT HEAD FINDINGS Brain: Old infarcts of the left cerebellum and posterior left parietal lobe. No acute hemorrhage. The size and configuration of the ventricles and extra-axial CSF spaces are normal. There is hypoattenuation of the periventricular white matter, most commonly indicating chronic ischemic microangiopathy. Skull: The visualized skull base, calvarium and extracranial soft tissues are normal. Sinuses/Orbits: No fluid levels or advanced mucosal thickening of the visualized paranasal sinuses. No mastoid or middle ear effusion. The orbits are normal. CTA NECK FINDINGS SKELETON: There is no bony spinal canal stenosis. No lytic or blastic lesion. OTHER NECK: Normal pharynx, larynx and major salivary glands. No cervical lymphadenopathy. Unremarkable thyroid gland. UPPER CHEST: No pneumothorax or pleural effusion. No nodules or masses. AORTIC ARCH: There is no calcific atherosclerosis of the aortic arch. There is no aneurysm, dissection or hemodynamically significant stenosis of the visualized portion of the aorta. Aberrant right subclavian artery. The visualized proximal subclavian arteries are widely patent. RIGHT CAROTID SYSTEM: Normal without aneurysm, dissection or stenosis. LEFT CAROTID SYSTEM: Normal without aneurysm, dissection or stenosis.  VERTEBRAL ARTERIES: Left dominant configuration. Both origins are clearly patent. There is no dissection, occlusion or flow-limiting stenosis to the skull base (V1-V3 segments). CTA HEAD FINDINGS POSTERIOR CIRCULATION: --Vertebral arteries: Normal V4 segments. --Inferior cerebellar arteries: Normal. --Basilar artery: Normal. --Superior cerebellar arteries: Normal. --Posterior cerebral arteries (PCA): Mild bilateral multifocal stenoses are unchanged. ANTERIOR CIRCULATION: --Intracranial internal carotid arteries: Normal. --Anterior cerebral arteries (ACA): Unchanged area of severe stenosis of the left A2 segment --Middle cerebral arteries (MCA): There are multifocal severe bilateral stenoses, unchanged. Stenosis are most severe at the distal M1 and proximal M2 segments. VENOUS SINUSES: As permitted by contrast timing, patent. ANATOMIC VARIANTS: None Review of the MIP images confirms the above findings. IMPRESSION: 1. No emergent large vessel occlusion. 2. Unchanged multifocal severe intracranial stenoses, including severe stenosis of the left anterior cerebral artery A2 segment and severe bilateral MCA distal M1 and proximal M2 stenoses. 3. 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 4. Aberrant right subclavian artery. Electronically Signed   By: Deatra RobinsonKevin  Herman M.D.   On: 06/15/2020 21:42      Scheduled Meds: . aspirin  300 mg Rectal Daily   Or  . aspirin  325 mg Oral Daily  . enoxaparin (LOVENOX) injection  40 mg Subcutaneous Q24H  . insulin aspart  0-9 Units Subcutaneous Q4H  . insulin detemir  10 Units  Subcutaneous QHS   Continuous Infusions: . lactated ringers 125 mL/hr at 06/17/20 0442  . levETIRAcetam 500 mg (06/17/20 1117)     LOS: 2 days      Time spent: 25 minutes   Noralee Stain, DO [facility] 06/17/2020, 11:47 AM   Available via Epic secure chat 7am-7pm After these hours, please refer to coverage provider listed on [email or web address]

## 2020-06-17 NOTE — Progress Notes (Addendum)
STROKE TEAM PROGRESS NOTE   INTERVAL HISTORY His significant other, Reino Bellis, is at the bedside. She is setting up patient to eat. He is not able to participate reliably with ROS due to aphasia. He is inconsistently following commands. Able to state [name]'s name. Not able to name 3/3 objects. We discussed ongoing work up and plan of care. She has discussed plan of care today with Dr. Erlinda Hong. She feels her questions have been answered.   Spoke with sister, Reyli Schroth, and updated her on [facility] neurology transfer attempt and their advice. She stated understanding that they advised no need to transfer to higher level of care. We further discussed the current plan of care and CIR recommendation. Questions were answered. She was appreciative of our efforts.   Vitals:   06/16/20 1730 06/16/20 1951 06/16/20 2346 06/17/20 0428  BP: 133/85 (!) 123/91 (!) 135/95 (!) 147/110  Pulse: 87 84 80 75  Resp: '18 18 18 19  ' Temp: 98.4 F (36.9 C) 98.5 F (36.9 C) 98.2 F (36.8 C) 97.8 F (36.6 C)  TempSrc: Oral Oral Oral Axillary  SpO2: 100% 100% 100% 100%  Weight:      Height:       CBC:  Recent Labs  Lab 06/15/20 2052 06/15/20 2058  WBC 8.9  --   NEUTROABS 4.2  --   HGB 12.9* 13.6  HCT 40.1 40.0  MCV 80.4  --   PLT 334  --    Basic Metabolic Panel:  Recent Labs  Lab 06/16/20 0735 06/17/20 0428  NA 139 137  K 2.5* 3.8  CL 110 99  CO2 22 30  GLUCOSE 85 88  BUN 11 10  CREATININE 0.88 1.16  CALCIUM 6.8* 8.8*  MG 1.4* 1.8   Lipid Panel: No results for input(s): CHOL, TRIG, HDL, CHOLHDL, VLDL, LDLCALC in the last 168 hours. HgbA1c: No results for input(s): HGBA1C in the last 168 hours. Urine Drug Screen:  Recent Labs  Lab 06/16/20 1020  LABOPIA NONE DETECTED  COCAINSCRNUR NONE DETECTED  LABBENZ NONE DETECTED  AMPHETMU NONE DETECTED  THCU NONE DETECTED  LABBARB NONE DETECTED    Alcohol Level  Recent Labs  Lab 06/15/20 2052  ETH <10    IMAGING past 24 hours No results  found. PHYSICAL EXAM Mental Status: Awake and alert. Severe expressive and receptive aphasia. Not able to state his name but able to state  signifcant other's name. Unable to test orientation. Unable to name 3 objects. Become frustrated with aphasia as evidenced by body language.  Cranial Nerves: II: Blinks to threat in right hemifield. No blink to threat in left hemifield. PERRL.   III,IV, VI: Can track to the left and right conjugately but with coarse horizontal nystagmus during pursuits. Had difficulty following commands for testing of upgaze.  V: Grossly intact responses to tactile stimulation on left and right. Unable to formally assess sensation due to aphasia.  VII: At rest, left perioral muscles with decreased tone, but elevates left side of mouth more briskly than right when asked to smile.  VIII: Hearing intact to some questions and commands.  IX,X: Not following commands for assessment XI: Head is midline.  XII: Does not protrude tongue to command Motor/Sensory: LUE: Mildly increased flexor tone. Slowly drifts to bed after passive elevation and release. Will grip examiner's hand and resist movement somewhat, but unable to formally test due to comprehension deficit. Reacts to pinch after a delay.  RUE: Drifts more slowly to bed than left with passive  elevation and release. More briskly reacts to pinch than on the left. More spontaneous movement as well as more brisk and purposeful movement on the right relative to the left.  LLE: Drifts slowly to bed after passive elevation and release. Flexes at knee and hip when withdrawing to noxious with 4-/5 strength. Reaction to noxious is brisk.  RLE: Drifts less slowly to bed than on left. Strength when withdrawing to noxious is 4/5. Reaction to noxious is brisker than on the left.  Deep Tendon Reflexes: Does not relax limbs sufficiently for definitive assessment.  Plantars: Equivocal bilaterally  Cerebellar: No gross ataxia with limited  assessment. Patient unable to follow commands for detailed testing.  Gait: Deferred    ASSESSMENT/PLAN [name] is a 45 y.o. male with history of recent recurrent cryptogenic ischemic strokes 4/5-4/8, 5/15-19, repeat ER visit 5/19, 5/20-5/31 here and transferred to [facility], multifocal severe intracranial stenoses, negative work up for vasculitis last week,  uncontrolled DM2, HLD, HTN, obesity and previous cocaine use. He was discharged and only home for a day when EMS was activated for the current episode of acute aphasia (alert but no verbal response at all) x 30 minutes.    CTA head & neck: No LVO. Unchanged multifocal severe intracranial stenoses, including severe stenosis of the left anterior cerebral artery A2 segment and severe bilateral MCA distal M1 and proximal M2 stenoses.  UKG:URKY again several new or increased acute infarcts are  demonstrated in both cerebral hemispheres (today the bilateral MCA and left ACA territories) since the recent MRI 06/04/2020. Stable or expected evolution of ischemia elsewhere, including       developing encephalomalacia in the left parietal lobe and the       left cerebellum (PICA) territories.  Recent TEE  5/17: EF 60-65%. Severe LVH. No shunt, thrombus or wall motion abnormality.   Recent Cerebral angiogram 5/18 : 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 3. Mod severe focal stenosis of Lt MCA inf division parietal branch.4. Focal stenosis of Lt ACA prox pericallosal branches  Recent LP 5/20 :  LP here at [facility] on 5/20 was bland, with normal WBC and protein, as well as normal CSF IgG index. CSF OCBs were negative. VDRL was negative. His CRP was elevated at 5.3 and ESR was 57. Hypercoagulable panel was unremarkable.  VZV IgM on 5/20 was negative. CSF VZV PCR could not be performed due to insufficient sample.   5/21-5/23 labs: ANA negative. Lupus panel was negtive.  Complement levels were not decreased, but C3 complement level was elevated at 203. Proetein C and S were normal. PT and PTT were normal. Lupus anticoagulant screen was negative. DRVVT normal. Beta2 glycoprotein Abs were normal. Vitamin B12 normal. Homocysteine normal. Ferritin normal.   LDL 39  HgbA1c 10.3  Unremarkable ESR 15 and CRP 0.8 on 6/2   VTE prophylaxis - On Lovenox PPX    Diet   Diet NPO time specified    On DAPT with ASA 325 and Plavix prior to admission, significant other, [name], reports she is managing medications and can report he has been taking reliably.   On ASA 325 and Plavix for the present   Therapy recommendations: CIR  Disposition:  CIR  Possible treatment option of transfer to higher to level of care: [facility] Neurology, Dr. Guerry Bruin, contacted for possible transfer but advised nothing further to offer after case review.   Concern for seizure  Started Keppra for "staring spells" on repeat ER visit 5/19  On Keppra currently  EEG: This study is suggestive of cortical dysfunction in right hemisphere likely secondary to underlying stroke. Additionally, there is moderate diffuse encephalopathy, non specific etiology. No seizures or epileptiform discharges were seen throughout the recording.  Hypertension . Permissive hypertension (OK if < 220/120) but gradually normalize in 5-7 days . Long-term BP goal normotensive  Hyperlipidemia  Home meds: resumed in hospital  LDL 39, at goal < 70  High intensity statin: Lipitor 76m resumed  Continue statin at discharge  Diabetes type II Uncontrolled  HgbA1c 10.3, goal < 7.0  CBGs  Management per primary team  Close PCP follow up and management  Other Stroke Risk Factors  History of cocaine use a few months a go, but negative since then.  ETOH use, alcohol level <10, advised to drink no more than1drink(s) a day  Obesity,Body mass index is 32.95 kg/m., BMI >/= 30 associated with increased stroke risk,  recommend weight loss, diet and exercise as appropriate  History of strokes  High risk for OSA.   Other Active Problems    Hospital day # 2  Delila A Bailey-Modzik, NP-C  ATTENDING NOTE: I reviewed above note and agree with the assessment and plan. Pt was seen and examined.   Patient fianc is at the bedside, patient no acute event overnight, still has partial global aphasia, speech therapist also at the bedside, recommend CIR for speech therapy.  He passed a swallow, put on diabetic and heart healthy diet.  Cardiology EEG long-term monitoring, no seizure activity, however right hemisphere intermittent slowing, will continue monitoring.  Continue Keppra.  Since he passed swallow, will initiate DAPT with aspirin 81 and Brilinta 90 twice daily for 30 days and then aspirin and Plavix after.  Resume Lipitor 80.  PT/OT recommend CIR.   [facility] MN[facility] Fetterand no indication for higher level academic hospital transfer.  Patient fancy and sister are aware.  For detailed assessment and plan, please refer to above as I have made changes wherever appropriate.   JRosalin Hawking MD PhD Stroke Neurology 06/17/2020 5:15 PM     To contact Stroke Continuity provider, please refer to [email or web address] After hours, contact General Neurology

## 2020-06-18 DIAGNOSIS — Z8673 Personal history of transient ischemic attack (TIA), and cerebral infarction without residual deficits: Secondary | ICD-10-CM

## 2020-06-18 LAB — BASIC METABOLIC PANEL
Anion gap: 7 (ref 5–15)
BUN: 11 mg/dL (ref 6–20)
CO2: 28 mmol/L (ref 22–32)
Calcium: 8.9 mg/dL (ref 8.9–10.3)
Chloride: 107 mmol/L (ref 98–111)
Creatinine, Ser: 1.12 mg/dL (ref 0.61–1.24)
GFR, Estimated: >60 mL/min (ref >60)
Glucose, Bld: 97 mg/dL (ref 70–99)
Potassium: 3.6 mmol/L (ref 3.5–5.1)
Sodium: 142 mmol/L (ref 135–145)

## 2020-06-18 LAB — GLUCOSE, CAPILLARY
Glucose-Capillary: 112 mg/dL — ABNORMAL HIGH (ref 70–99)
Glucose-Capillary: 117 mg/dL — ABNORMAL HIGH (ref 70–99)
Glucose-Capillary: 118 mg/dL — ABNORMAL HIGH (ref 70–99)
Glucose-Capillary: 184 mg/dL — ABNORMAL HIGH (ref 70–99)
Glucose-Capillary: 85 mg/dL (ref 70–99)
Glucose-Capillary: 91 mg/dL (ref 70–99)
Glucose-Capillary: 97 mg/dL (ref 70–99)

## 2020-06-18 MED ORDER — LEVETIRACETAM IN NACL 1500 MG/100ML IV SOLN
1500.0000 mg | Freq: Two times a day (BID) | INTRAVENOUS | Status: DC
  Administered 2020-06-18 – 2020-06-19 (×2): 1500 mg via INTRAVENOUS
  Filled 2020-06-18 (×3): qty 100

## 2020-06-18 MED ORDER — LEVETIRACETAM 500 MG PO TABS
1000.0000 mg | ORAL_TABLET | Freq: Two times a day (BID) | ORAL | Status: DC
  Administered 2020-06-18: 1000 mg via ORAL
  Filled 2020-06-18: qty 2

## 2020-06-18 NOTE — Progress Notes (Signed)
LTM EEG revealed a nonconvulsive seizure at 6:30 PM.   Increasing Keppra to 1500 mg BID and dosage route to IV. Dose to be given now.   Will continue to monitor LTM EEG.   Electronically signed: Dr. Caryl Pina

## 2020-06-18 NOTE — Progress Notes (Signed)
PROGRESS NOTE    Nathaniel Spears  INO:[id number] DOB: 1975/09/25 DOA: 06/15/2020 PCP: Grayce Sessions, NP     Brief Narrative:  Nathaniel Spears is a 45 year old male with past medical history significant for hypertension, diabetes, hyperlipidemia who presented to the hospital with chief complaint of aphasia.  Patient has been hospitalized for recurrent stroke since 04/20/2020.  Patient was admitted from 5/22 5/23 for acute strokes, transfer to [facility] for further neurologic work-up.  Work-up for CNS vasculitis was negative.  He now returns back to the emergency department with complaints of aphasia.  Neurology consulted.   New events last 24 hours / Subjective: Patient remains on LTM EEG this morning.  He is more interactive on exam than previously.  He continues to have global expressive aphasia, very frustrated by his body language.  He denies any new symptoms.  Assessment & Plan:   Principal Problem:   Acute ischemic stroke [facility]) Active Problems:   Essential hypertension   Type 2 diabetes mellitus with hyperglycemia, with long-term current use of insulin (HCC)   Recurrent stroke -MRI brain showed several new or increased acute infarcts in both cerebral hemispheres -CTA head and neck: No emergent large vessel occlusion, unchanged multifocal severe intracranial stenosis -Neurology following -EEG showed lateralized rhythmic delta activity which is on the ictal-interictal continuum with low potential for seizures and is suggestive of cortical dysfunction in right hemisphere likely secondary to underlying stroke. Additionally, there is moderate diffuse encephalopathy, non specific etiology.No seizures or epileptiform discharges were seen throughout the recording. -Aspirin/plavix PTA, statin --> changed to aspirin/Brilinta -Keppra (started for seizure prophylaxis in May) -PT OT SLP recommending CIR  Hypertension -Allow permissive hypertension in setting of acute stroke  Diabetes  mellitus type 2 -Continue Levemir, sliding scale insulin     DVT prophylaxis:  enoxaparin (LOVENOX) injection 40 mg Start: 06/16/20 1000  Code Status:     Code Status Orders  (From admission, onward)         Start     Ordered   06/15/20 2305  Full code  Continuous        06/15/20 2308        Code Status History    Date Active Date Inactive Code Status Order ID Comments User Context   06/04/2020 2222 06/07/2020 0623 Full Code [id number]  Marinda Elk, MD Inpatient   05/30/2020 2103 06/03/2020 1524 Full Code [id number]  Lewie Chamber, MD ED   04/20/2020 1922 04/23/2020 1909 Full Code [id number]  Charlsie Quest, MD ED   Advance Care Planning Activity     Family Communication: Parents at bedside  Disposition Plan:  Status is: Inpatient  Remains inpatient appropriate because:Ongoing diagnostic testing needed not appropriate for outpatient work up and Unsafe d/c plan   Dispo: The patient is from: Home              Anticipated d/c is to: CIR               Patient currently is not medically stable to d/c.   Difficult to place patient No      Consultants:   Neurology  Procedures:   None   Antimicrobials:  Anti-infectives (From admission, onward)   None       Objective: Vitals:   06/17/20 2339 06/18/20 0409 06/18/20 0806 06/18/20 1100  BP: (!) 133/117 (!) 140/97 (!) 144/106 (!) 147/105  Pulse: 67 81 80 79  Resp: 18 18 18 18   Temp: 98.4 F (36.9 C) 98 F (36.7  C) 97.8 F (36.6 C) 98.2 F (36.8 C)  TempSrc: Oral Oral Oral Oral  SpO2: 98% 100% 100% 99%  Weight:      Height:        Intake/Output Summary (Last 24 hours) at 06/18/2020 1215 Last data filed at 06/18/2020 0500 Gross per 24 hour  Intake 0 ml  Output --  Net 0 ml   Filed Weights   06/15/20 2146  Weight: 115 kg    Examination: General exam: Appears calm and comfortable  Respiratory system: Clear to auscultation. Respiratory effort normal. Cardiovascular system: S1 & S2 heard, RRR. No  pedal edema. Gastrointestinal system: Abdomen is nondistended, soft and nontender. Normal bowel sounds heard. Central nervous system: Alert, EOMI, expressive aphasia present, able to move all extremities, tongue protrusion normal  Extremities: Symmetric in appearance bilaterally  Skin: No rashes, lesions or ulcers on exposed skin  Psychiatry: Judgement and insight appear stable.   Data Reviewed: I have personally reviewed following labs and imaging studies  CBC: Recent Labs  Lab 06/15/20 2052 06/15/20 2058  WBC 8.9  --   NEUTROABS 4.2  --   HGB 12.9* 13.6  HCT 40.1 40.0  MCV 80.4  --   PLT 334  --    Basic Metabolic Panel: Recent Labs  Lab 06/15/20 2052 06/15/20 2058 06/16/20 0735 06/17/20 0428 06/18/20 0130  NA 138 139 139 137 142  K 3.4* 3.4* 2.5* 3.8 3.6  CL 99 98 110 99 107  CO2 29  --  22 30 28   GLUCOSE 109* 108* 85 88 97  BUN 18 20 11 10 11   CREATININE 1.43* 1.50* 0.88 1.16 1.12  CALCIUM 9.1  --  6.8* 8.8* 8.9  MG  --   --  1.4* 1.8  --    GFR: Estimated Creatinine Clearance: 109.1 mL/min (by C-G formula based on SCr of 1.12 mg/dL). Liver Function Tests: Recent Labs  Lab 06/15/20 2052  AST 12*  ALT 15  ALKPHOS 57  BILITOT 0.8  PROT 6.7  ALBUMIN 3.5   No results for input(s): LIPASE, AMYLASE in the last 168 hours. No results for input(s): AMMONIA in the last 168 hours. Coagulation Profile: Recent Labs  Lab 06/15/20 2052  INR 1.1   Cardiac Enzymes: No results for input(s): CKTOTAL, CKMB, CKMBINDEX, TROPONINI in the last 168 hours. BNP (last 3 results) No results for input(s): PROBNP in the last 8760 hours. HbA1C: No results for input(s): HGBA1C in the last 72 hours. CBG: Recent Labs  Lab 06/17/20 2031 06/18/20 0028 06/18/20 0431 06/18/20 0832 06/18/20 1133  GLUCAP 105* 97 85 91 118*   Lipid Profile: No results for input(s): CHOL, HDL, LDLCALC, TRIG, CHOLHDL, LDLDIRECT in the last 72 hours. Thyroid Function Tests: No results for  input(s): TSH, T4TOTAL, FREET4, T3FREE, THYROIDAB in the last 72 hours. Anemia Panel: No results for input(s): VITAMINB12, FOLATE, FERRITIN, TIBC, IRON, RETICCTPCT in the last 72 hours. Sepsis Labs: No results for input(s): PROCALCITON, LATICACIDVEN in the last 168 hours.  Recent Results (from the past 240 hour(s))  Resp Panel by RT-PCR (Flu A&B, Covid) Nasopharyngeal Swab     Status: None   Collection Time: 06/15/20 10:26 PM   Specimen: Nasopharyngeal Swab; Nasopharyngeal(NP) swabs in vial transport medium  Result Value Ref Range Status   SARS Coronavirus 2 by RT PCR NEGATIVE NEGATIVE Final    Comment: (NOTE) SARS-CoV-2 target nucleic acids are NOT DETECTED.  The SARS-CoV-2 RNA is generally detectable in upper respiratory specimens during the acute phase  of infection. The lowest concentration of SARS-CoV-2 viral copies this assay can detect is 138 copies/mL. A negative result does not preclude SARS-Cov-2 infection and should not be used as the sole basis for treatment or other patient management decisions. A negative result may occur with  improper specimen collection/handling, submission of specimen other than nasopharyngeal swab, presence of viral mutation(s) within the areas targeted by this assay, and inadequate number of viral copies(<138 copies/mL). A negative result must be combined with clinical observations, patient history, and epidemiological information. The expected result is Negative.  Fact Sheet for Patients:  BloggerCourse.com  Fact Sheet for Healthcare Providers:  SeriousBroker.it  This test is no t yet approved or cleared by the [place] FDA and  has been authorized for detection and/or diagnosis of SARS-CoV-2 by FDA under an Emergency Use Authorization (EUA). This EUA will remain  in effect (meaning this test can be used) for the duration of the COVID-19 declaration under Section 564(b)(1) of the Act,  21 U.S.C.section 360bbb-3(b)(1), unless the authorization is terminated  or revoked sooner.       Influenza A by PCR NEGATIVE NEGATIVE Final   Influenza B by PCR NEGATIVE NEGATIVE Final    Comment: (NOTE) The Xpert Xpress SARS-CoV-2/FLU/RSV plus assay is intended as an aid in the diagnosis of influenza from Nasopharyngeal swab specimens and should not be used as a sole basis for treatment. Nasal washings and aspirates are unacceptable for Xpert Xpress SARS-CoV-2/FLU/RSV testing.  Fact Sheet for Patients: BloggerCourse.com  Fact Sheet for Healthcare Providers: SeriousBroker.it  This test is not yet approved or cleared by the [place] FDA and has been authorized for detection and/or diagnosis of SARS-CoV-2 by FDA under an Emergency Use Authorization (EUA). This EUA will remain in effect (meaning this test can be used) for the duration of the COVID-19 declaration under Section 564(b)(1) of the Act, 21 U.S.C. section 360bbb-3(b)(1), unless the authorization is terminated or revoked.  Performed at [facility], [address] [place], [place] [address]       Radiology Studies: EEG adult  Result Date: 21-Jun-2020 Charlsie Quest, MD     June 21, 2020 10:22 AM Patient Name: Trystian Crisanto MRN: [id number] Epilepsy Attending: Charlsie Quest Referring Physician/Provider: Shon Hale, NP Date: 06-22-2018 Duration: 26.40 minutes Patient history: 45 year old male with multiple strokes and seizure-like activity.  EEG to evaluate for seizures. Level of alertness: Awake AEDs during EEG study: Keppra Technical aspects: This EEG study was done with scalp electrodes positioned according to the 10-20 International system of electrode placement. Electrical activity was acquired at a sampling rate of 500Hz  and reviewed with a high frequency filter of 70Hz  and a low frequency filter of 1Hz . EEG data were recorded continuously and  digitally stored. Description: No clear posterior dominant rhythm was seen. EEG showed continuous generalized 3 to 6 Hz theta-delta slowing as well as intermittent generalized and lateralized right hemisphere rhythmic 2 to 3 Hz delta slowing.  Hyperventilation and photic stimulation were not performed.   ABNORMALITY - Continuous slow, generalized -Intermittent rhythmic delta slowing, generalized and lateralized right hemisphere IMPRESSION: This study is suggestive of cortical dysfunction in right hemisphere likely secondary to underlying stroke. Additionally, there is moderate diffuse encephalopathy, non specific etiology. No seizures or epileptiform discharges were seen throughout the recording. Priyanka   Overnight EEG with video  Result Date: 06/18/2020 , MD     06/18/2020  8:40 AM Patient Name: Ali Mohl MRN: Charlsie Quest Epilepsy Attending: 08/18/2020  Melynda Ripple Referring Physician/Provider: Shon Hale, NP Duration: 06/17/2020 0955 to 06/18/2020 0830  Patient history: 45 year old male with multiple strokes and seizure-like activity.  EEG to evaluate for seizures.  Level of alertness: Awake, asleep  AEDs during EEG study: Keppra  Technical aspects: This EEG study was done with scalp electrodes positioned according to the 10-20 International system of electrode placement. Electrical activity was acquired at a sampling rate of 500Hz  and reviewed with a high frequency filter of 70Hz  and a low frequency filter of 1Hz . EEG data were recorded continuously and digitally stored.  Description: No clear posterior dominant rhythm was seen. EEG showed continuous generalized 3 to 6 Hz theta-delta slowing as well as intermittent generalized and lateralized right hemisphere rhythmic 2 to 3 Hz delta slowing.  Hyperventilation and photic stimulation were not performed.    ABNORMALITY - Continuous slow, generalized -Intermittent rhythmic delta slowing, generalized and lateralized right  hemisphere  IMPRESSION: This study  showed lateralized rhythmic delta activity which is on the ictal-interictal continuum with low potential for seizures and is suggestive of cortical dysfunction in right hemisphere likely secondary to underlying stroke. Additionally, there is moderate diffuse encephalopathy, non specific etiology. No seizures or epileptiform discharges were seen throughout the recording.  Priyanka      Scheduled Meds: . aspirin EC  81 mg Oral Daily  . atorvastatin  80 mg Oral Daily  . enoxaparin (LOVENOX) injection  40 mg Subcutaneous Q24H  . insulin aspart  0-9 Units Subcutaneous Q4H  . insulin detemir  10 Units Subcutaneous QHS  . levETIRAcetam  1,000 mg Oral BID  . ticagrelor  90 mg Oral BID   Continuous Infusions:    LOS: 3 days      Time spent: 25 minutes   , DO [facility] 06/18/2020, 12:15 PM   Available via Epic secure chat 7am-7pm After these hours, please refer to coverage provider listed on [email or web address]

## 2020-06-18 NOTE — Progress Notes (Signed)
LTM maint complete - Fz Cz F4  [facility] monitored, Event button test confirmed by [facility].

## 2020-06-18 NOTE — Progress Notes (Signed)
NO SKIN BREAKDOWN AT F7 A1 AND A2

## 2020-06-18 NOTE — Consult Note (Addendum)
Physical Medicine and Rehabilitation Consult Reason for Consult:stroke Referring Physician: Maylene Roes   HPI: Nathaniel Spears is a 45 y.o. male with history of hypertension and diabetes who has been in and out of the hospitals with recurrent strokes since April 20, 2020.  He was transferred to [facility] at 1 point for neurological work-up.  Currently work-up was negative for vasculitis.  He returned to [facility] on 06/15/2020 with word finding deficits.  MRI of the brain showed several new or increased acute infarcts in both cerebral hemispheres.  CTA of the head neck showed no large vessel occlusion, unchanged multifocal severe intracranial stenosis.  EEG was negative for seizures but did suggest cortical dysfunction in the right hemisphere secondary to underlying stroke.  It also showed diffuse encephalopathy, nonspecific in etiology.  Patient was on aspirin and Plavix as well as statin prior to arrival.  He was changed to aspirin and Brilinta.  Patient was seen by physical and Occupational Therapy who felt from a functional standpoint this patient could benefit from inpatient rehab.  Patient had been placed on Keppra prior to this admission for staring spells which were concerning for seizure activity.   Review of Systems  Constitutional:  Negative for fever.  Eyes:  Negative for double vision.  Neurological:  Positive for weakness.  Past Medical History:  Diagnosis Date   Hypertension    Stroke [facility])    Past Surgical History:  Procedure Laterality Date   BUBBLE STUDY  06/01/2020   Procedure: BUBBLE STUDY;  Surgeon: Donato Heinz, MD;  Location: [place];  Service: Cardiovascular;;   IR ANGIO INTRA EXTRACRAN SEL COM CAROTID INNOMINATE BILAT MOD SED  06/02/2020   IR ANGIO VERTEBRAL SEL SUBCLAVIAN INNOMINATE BILAT MOD SED  06/02/2020   TEE WITHOUT CARDIOVERSION N/A 06/01/2020   Procedure: TRANSESOPHAGEAL ECHOCARDIOGRAM (TEE);  Surgeon: Donato Heinz, MD;   Location: [facility] ENDOSCOPY;  Service: Cardiovascular;  Laterality: N/A;   Family History  Problem Relation Age of Onset   Stroke Neg Hx    Social History:  reports that he has quit smoking. He has never used smokeless tobacco. He reports previous alcohol use. He reports that he does not use drugs. Allergies: No Known Allergies Medications Prior to Admission  Medication Sig Dispense Refill   acetaminophen (TYLENOL) 325 MG tablet Take 2 tablets (650 mg total) by mouth every 4 (four) hours as needed for mild pain (or temp > 37.5 C (99.5 F)). 20 tablet 0   aspirin 325 MG EC tablet Take 1 tablet (325 mg total) by mouth daily. 30 tablet 2   atorvastatin (LIPITOR) 40 MG tablet Take 2 tablets (80 mg total) by mouth daily.     blood glucose meter kit and supplies Dispense based on patient and insurance preference. Use up to four times daily as directed. (FOR ICD-10 E10.9, E11.9). (Patient taking differently: Dispense based on patient and insurance preference. Qd (FOR ICD-10 E10.9, E11.9).) 1 each 0   Blood Glucose Monitoring Suppl (TRUE METRIX METER) w/Device KIT Use to check blood sugar TID. (Patient taking differently: Use to check blood sugar qd) 1 kit 0   clopidogrel (PLAVIX) 75 MG tablet Take 1 tablet (75 mg total) by mouth daily. 30 tablet 0   glucose blood (TRUE METRIX BLOOD GLUCOSE TEST) test strip Use to check blood sugar TID. (Patient taking differently: Use to check blood sugar qd) 100 each 2   insulin detemir (LEVEMIR FLEXTOUCH) 100 UNIT/ML FlexPen Inject 20 Units under the  skin nightly for 30 days. (Patient taking differently: Inject 20 Units into the skin daily.) 15 mL 0   levETIRAcetam (KEPPRA) 500 MG tablet Take 1 tablet (500 mg total) by mouth 2 (two) times a day for 30 days. Home med 60 tablet 0   metFORMIN (GLUCOPHAGE) 1000 MG tablet Take 1,000 mg by mouth 2 (two) times daily with a meal.     TRUEplus Lancets 28G MISC Use to check blood sugar 3 times daily. (Patient taking differently: Use  to check blood sugar qd) 100 each 2   aspirin EC 81 MG tablet Take 1 tablet (81 mg total) by mouth daily for 30 days. 30 tablet 0   atorvastatin (LIPITOR) 80 MG tablet Take 1 tablet (80 mg total) by mouth nightly for 30 days. 30 tablet 0   clopidogrel (PLAVIX) 75 MG tablet Take 1 tablet (75 mg total) by mouth daily for 30 days. 30 tablet 0   Insulin Glargine (BASAGLAR KWIKPEN) 100 UNIT/ML Inject 15 Units into the skin daily. (Patient not taking: No sig reported) 10 mL 2   Insulin Glargine (BASAGLAR KWIKPEN) 100 UNIT/ML Inject 20 Units under the skin nightly for 30 days. After done with levemir 6 mL 0   metFORMIN (GLUCOPHAGE-XR) 500 MG 24 hr tablet Take 2 tablets (1,000 mg total) by mouth 2 (two) times a day with meals for 30 days. (Patient not taking: No sig reported) 120 tablet 0   methylPREDNISolone sodium succinate 1,000 mg in sodium chloride 0.9 % 50 mL Inject 1,000 mg into the vein daily. (Patient not taking: No sig reported)      Home: Home Living Family/patient expects to be discharged to:: Private residence Living Arrangements: Spouse/significant other Available Help at Discharge: Family,Available 24 hours/day Type of Home: Apartment Home Access: Stairs to enter Entrance Stairs-Number of Steps: 6 Entrance Stairs-Rails: None Home Layout: Two level Alternate Level Stairs-Number of Steps: flight Alternate Level Stairs-Rails: Right Bathroom Shower/Tub: Chiropodist: Standard Home Equipment: None Additional Comments: wife at home recently, denied FMLA approval after recent medical events.  Lives With: Significant other  Functional History: Prior Function Level of Independence: Needs assistance Gait / Transfers Assistance Needed: pt was independent prior to april, mobilizing independently to bathroom and up stairs since last admission per spouse ADL's / Homemaking Assistance Needed: requiring supervision ADL/IADLs including cooking and bathing since last admission  in May Communication / Swallowing Assistance Needed: seeing speech therapy for follow-up on aphasia since last admission in May 2022 Comments: Informantion taken from chart, patient is unable to describe, and no family in the room. Functional Status:  Mobility: Bed Mobility Overal bed mobility: Needs Assistance Bed Mobility: Rolling Rolling: Mod assist Supine to sit: Mod assist,HOB elevated Sit to supine: Mod assist General bed mobility comments: increased time and tactile cueing. Transfers Overall transfer level: Needs assistance Equipment used: 1 person hand held assist Transfers: Sit to/from Stand Sit to Stand: Min assist General transfer comment: was told not to get patient OOB during EEG Ambulation/Gait Ambulation/Gait assistance: Min assist Gait Distance (Feet): 15 Feet Assistive device: 1 person hand held assist Gait Pattern/deviations: Step-to pattern General Gait Details: pt with slowed step-to gait, increased lateral sway and widened BOS, gait distance limited by attempts to remain on EEG video monitoring. Pt requires verbal/visual/tactile cues to direct and sequence gait Gait velocity: reduced Gait velocity interpretation: <1.8 ft/sec, indicate of risk for recurrent falls    ADL: ADL General ADL Comments: ADL testing ongoing  Cognition: Cognition Overall Cognitive Status:  No family/caregiver present to determine baseline cognitive functioning Arousal/Alertness: Awake/alert Orientation Level: Oriented to person,Disoriented to situation,Disoriented to time,Disoriented to place Cognition Arousal/Alertness: Awake/alert Behavior During Therapy: WFL for tasks assessed/performed Overall Cognitive Status: No family/caregiver present to determine baseline cognitive functioning Area of Impairment: Orientation,Attention,Memory,Following commands,Safety/judgement,Awareness,Problem solving Orientation Level: Disoriented to,Time Current Attention Level: Focused Memory:  Decreased recall of precautions Following Commands: Follows one step commands inconsistently,Follows one step commands with increased time Safety/Judgement: Decreased awareness of safety,Decreased awareness of deficits Problem Solving: Difficulty sequencing,Requires verbal cues,Requires tactile cues,Slow processing General Comments: Patient being seen for outpatient OT for:  Attention and concentration deficit, Hemiplegia and hemiparesis following cerebral infarction affecting right non-dominant side, Visuospatial deficit (inattention to the R versus field cut), Frontal lobe and executive function deficit.  Blood pressure (!) 147/105, pulse 79, temperature 98.2 F (36.8 C), temperature source Oral, resp. rate 18, height 6' (1.829 m), weight 115 kg, SpO2 99 %. Physical Exam Cardiovascular:     Rate and Rhythm: Normal rate.  Pulmonary:     Effort: Pulmonary effort is normal.  Neurological:     Mental Status: He is alert.     Motor: Weakness present.    Results for orders placed or performed during the hospital encounter of 06/15/20 (from the past 24 hour(s))  Glucose, capillary     Status: Abnormal   Collection Time: 06/17/20  3:54 PM  Result Value Ref Range   Glucose-Capillary 128 (H) 70 - 99 mg/dL  Glucose, capillary     Status: Abnormal   Collection Time: 06/17/20  8:31 PM  Result Value Ref Range   Glucose-Capillary 105 (H) 70 - 99 mg/dL  Glucose, capillary     Status: None   Collection Time: 06/18/20 12:28 AM  Result Value Ref Range   Glucose-Capillary 97 70 - 99 mg/dL  Basic metabolic panel     Status: None   Collection Time: 06/18/20  1:30 AM  Result Value Ref Range   Sodium 142 135 - 145 mmol/L   Potassium 3.6 3.5 - 5.1 mmol/L   Chloride 107 98 - 111 mmol/L   CO2 28 22 - 32 mmol/L   Glucose, Bld 97 70 - 99 mg/dL   BUN 11 6 - 20 mg/dL   Creatinine, Ser 1.12 0.61 - 1.24 mg/dL   Calcium 8.9 8.9 - 10.3 mg/dL   GFR, Estimated >60 >60 mL/min   Anion gap 7 5 - 15  Glucose,  capillary     Status: None   Collection Time: 06/18/20  4:31 AM  Result Value Ref Range   Glucose-Capillary 85 70 - 99 mg/dL  Glucose, capillary     Status: None   Collection Time: 06/18/20  8:32 AM  Result Value Ref Range   Glucose-Capillary 91 70 - 99 mg/dL  Glucose, capillary     Status: Abnormal   Collection Time: 06/18/20 11:33 AM  Result Value Ref Range   Glucose-Capillary 118 (H) 70 - 99 mg/dL   EEG adult  Result Date: 06/17/2020 Lora Havens, MD     06/17/2020 10:22 AM Patient Name: Nathaniel Spears MRN: [id number] Epilepsy Attending: Lora Havens Referring Physician/Provider: Charlene Brooke, NP Date: 06/18/2018 Duration: 26.40 minutes Patient history: 45 year old male with multiple strokes and seizure-like activity.  EEG to evaluate for seizures. Level of alertness: Awake AEDs during EEG study: Keppra Technical aspects: This EEG study was done with scalp electrodes positioned according to the 10-20 International system of electrode placement. Electrical activity was acquired at a sampling rate of  500Hz and reviewed with a high frequency filter of 70Hz and a low frequency filter of 1Hz. EEG data were recorded continuously and digitally stored. Description: No clear posterior dominant rhythm was seen. EEG showed continuous generalized 3 to 6 Hz theta-delta slowing as well as intermittent generalized and lateralized right hemisphere rhythmic 2 to 3 Hz delta slowing.  Hyperventilation and photic stimulation were not performed.   ABNORMALITY - Continuous slow, generalized -Intermittent rhythmic delta slowing, generalized and lateralized right hemisphere IMPRESSION: This study is suggestive of cortical dysfunction in right hemisphere likely secondary to underlying stroke. Additionally, there is moderate diffuse encephalopathy, non specific etiology. No seizures or epileptiform discharges were seen throughout the recording. Priyanka Barbra Sarks   Overnight EEG with video  Result Date:  06/18/2020 Lora Havens, MD     06/18/2020  8:40 AM Patient Name: Nathaniel Spears MRN: [id number] Epilepsy Attending: Lora Havens Referring Physician/Provider: Charlene Brooke, NP Duration: 06/17/2020 0955 to 06/18/2020 0830   Patient history: 45 year old male with multiple strokes and seizure-like activity.  EEG to evaluate for seizures.   Level of alertness: Awake, asleep   AEDs during EEG study: Keppra   Technical aspects: This EEG study was done with scalp electrodes positioned according to the 10-20 International system of electrode placement. Electrical activity was acquired at a sampling rate of 500Hz and reviewed with a high frequency filter of 70Hz and a low frequency filter of 1Hz. EEG data were recorded continuously and digitally stored.   Description: No clear posterior dominant rhythm was seen. EEG showed continuous generalized 3 to 6 Hz theta-delta slowing as well as intermittent generalized and lateralized right hemisphere rhythmic 2 to 3 Hz delta slowing.  Hyperventilation and photic stimulation were not performed.     ABNORMALITY - Continuous slow, generalized -Intermittent rhythmic delta slowing, generalized and lateralized right hemisphere   IMPRESSION: This study  showed lateralized rhythmic delta activity which is on the ictal-interictal continuum with low potential for seizures and is suggestive of cortical dysfunction in right hemisphere likely secondary to underlying stroke. Additionally, there is moderate diffuse encephalopathy, non specific etiology. No seizures or epileptiform discharges were seen throughout the recording.   Priyanka Barbra Sarks    Assessment/Plan: Diagnosis: Acute by cerebral infarcts (bilateral MCA and left ACA territories) with subsequent aphasia, gait, sensory, cognitive and balance deficits more involving the right side from a motor planning and sensory standpoint..  Patient with history of prior events over the last 2 months.   Does the need for close, 24  hr/day medical supervision in concert with the patient's rehab needs make it unreasonable for this patient to be served in a less intensive setting? Yes Co-Morbidities requiring supervision/potential complications: Hypertension, diabetes Due to bladder management, bowel management, safety, skin/wound care, disease management, medication administration, pain management and patient education, does the patient require 24 hr/day rehab nursing? Yes Does the patient require coordinated care of a physician, rehab nurse, therapy disciplines of PT, OT, speech therapy to address physical and functional deficits in the context of the above medical diagnosis(es)? Yes Addressing deficits in the following areas: balance, endurance, locomotion, strength, transferring, bowel/bladder control, bathing, dressing, feeding, grooming, toileting, cognition, speech and psychosocial support Can the patient actively participate in an intensive therapy program of at least 3 hrs of therapy per day at least 5 days per week? Yes The potential for patient to make measurable gains while on inpatient rehab is excellent Anticipated functional outcomes upon discharge from inpatient rehab are modified independent and supervision  with PT, modified independent and supervision with OT, modified independent with SLP. Estimated rehab length of stay to reach the above functional goals is: 10-13 days Anticipated discharge destination: Home Overall Rehab/Functional Prognosis: excellent  RECOMMENDATIONS: This patient's condition is appropriate for continued rehabilitative care in the following setting: CIR Patient has agreed to participate in recommended program. Potentially Note that insurance prior authorization may be required for reimbursement for recommended care.  Comment: Rehab Admissions Coordinator to follow up.  Thanks,  Meredith Staggers, MD, Mellody Drown      Meredith Staggers, MD 06/18/2020

## 2020-06-18 NOTE — Progress Notes (Addendum)
STROKE TEAM PROGRESS NOTE   INTERVAL HISTORY No acute events.EEG with "lateralized rhythmic delta activity which is on the ictal-interictal continuum with low potential for seizures and is suggestive of cortical dysfunction in right hemisphere likely secondary to underlying stroke." Dr. Hortense Ramal recommends increased Keppra dose.  Aphasia limits ROS. No visitors at bedside.  Patient is sitting up in bed feeding self with fair appetite. Able to state full name this morning. Continues to inconsistently follow commands. Neuro exam stable.  I explained the EEG finding and plan to increase keppra dose, discharge plan for CIR. Unclear if he understood. Will reiterate over time.  Vitals:   06/17/20 1944 06/17/20 2339 06/18/20 0409 06/18/20 0806  BP: 125/84 (!) 133/117 (!) 140/97 (!) 144/106  Pulse: 71 67 81 80  Resp: '19 18 18 18  ' Temp: 97.9 F (36.6 C) 98.4 F (36.9 C) 98 F (36.7 C) 97.8 F (36.6 C)  TempSrc: Oral Oral Oral Oral  SpO2: 100% 98% 100% 100%  Weight:      Height:       CBC:  Recent Labs  Lab 06/15/20 2052 06/15/20 2058  WBC 8.9  --   NEUTROABS 4.2  --   HGB 12.9* 13.6  HCT 40.1 40.0  MCV 80.4  --   PLT 334  --    Basic Metabolic Panel:  Recent Labs  Lab 06/16/20 0735 06/17/20 0428 06/18/20 0130  NA 139 137 142  K 2.5* 3.8 3.6  CL 110 99 107  CO2 '22 30 28  ' GLUCOSE 85 88 97  BUN '11 10 11  ' CREATININE 0.88 1.16 1.12  CALCIUM 6.8* 8.8* 8.9  MG 1.4* 1.8  --    Lipid Panel: No results for input(s): CHOL, TRIG, HDL, CHOLHDL, VLDL, LDLCALC in the last 168 hours. HgbA1c: No results for input(s): HGBA1C in the last 168 hours. Urine Drug Screen:  Recent Labs  Lab 06/16/20 1020  LABOPIA NONE DETECTED  COCAINSCRNUR NONE DETECTED  LABBENZ NONE DETECTED  AMPHETMU NONE DETECTED  THCU NONE DETECTED  LABBARB NONE DETECTED    Alcohol Level  Recent Labs  Lab 06/15/20 2052  ETH <10    IMAGING past 24 hours EEG adult  Result Date: 06/17/2020 Lora Havens, MD      06/17/2020 10:22 AM Patient Name: Neco Kling MRN: [id number] Epilepsy Attending: Lora Havens Referring Physician/Provider: Charlene Brooke, NP Date: 06/18/2018 Duration: 26.40 minutes Patient history: 45 year old male with multiple strokes and seizure-like activity.  EEG to evaluate for seizures. Level of alertness: Awake AEDs during EEG study: Keppra Technical aspects: This EEG study was done with scalp electrodes positioned according to the 10-20 International system of electrode placement. Electrical activity was acquired at a sampling rate of '500Hz'  and reviewed with a high frequency filter of '70Hz'  and a low frequency filter of '1Hz' . EEG data were recorded continuously and digitally stored. Description: No clear posterior dominant rhythm was seen. EEG showed continuous generalized 3 to 6 Hz theta-delta slowing as well as intermittent generalized and lateralized right hemisphere rhythmic 2 to 3 Hz delta slowing.  Hyperventilation and photic stimulation were not performed.   ABNORMALITY - Continuous slow, generalized -Intermittent rhythmic delta slowing, generalized and lateralized right hemisphere IMPRESSION: This study is suggestive of cortical dysfunction in right hemisphere likely secondary to underlying stroke. Additionally, there is moderate diffuse encephalopathy, non specific etiology. No seizures or epileptiform discharges were seen throughout the recording. [place]   PHYSICAL EXAM Mental Status: Awake and alert. Severe expressive and  receptive aphasia. Not able to state his name but able to state  signifcant other's name. Unable to test orientation. Unable to name 3 objects. Become frustrated with aphasia as evidenced by body language.  Cranial Nerves: II: Blinks to threat in right hemifield. No blink to threat in left hemifield. PERRL.   III,IV, VI: Can track to the left and right conjugately but with coarse horizontal nystagmus during pursuits. Had difficulty following commands  for testing of upgaze.  V: Grossly intact responses to tactile stimulation on left and right. Unable to formally assess sensation due to aphasia.  VII: At rest, left perioral muscles with decreased tone, but elevates left side of mouth more briskly than right when asked to smile.  VIII: Hearing intact to some questions and commands.  IX,X: Not following commands for assessment XI: Head is midline.  XII: Does not protrude tongue to command   Motor/Sensory: LUE: Mildly increased flexor tone. Slowly drifts to bed after passive elevation and release. Will grip examiner's hand and resist movement somewhat, but unable to formally test due to comprehension deficit. Reacts to pinch after a delay.  RUE: Drifts more slowly to bed than left with passive elevation and release. More briskly reacts to pinch than on the left. More spontaneous movement as well as more brisk and purposeful movement on the right relative to the left.  LLE: Drifts slowly to bed after passive elevation and release. Flexes at knee and hip when withdrawing to noxious with 4-/5 strength. Reaction to noxious is brisk.  RLE: Drifts less slowly to bed than on left. Strength when withdrawing to noxious is 4/5. Reaction to noxious is brisker than on the left.  Plantars: Equivocal bilaterally  Cerebellar: No gross ataxia with limited assessment. Patient unable to follow commands for detailed testing.  Gait: Deferred   ASSESSMENT/PLAN Mr. Keijuan Crymes is a 45 y.o. male with history of recent recurrent cryptogenic ischemic strokes 4/5-4/8, 5/15-19, repeat ER visit 5/19, 5/20-5/31 here and transferred to [facility], multifocal severe intracranial stenoses, negative work up for vasculitis last week, uncontrolled DM2, HLD, HTN, obesity and previous cocaine use. He was discharged and only home for a day when EMS was activated for the current episode of acute aphasia (alert but no verbal response at all) x 30 minutes.   Stroke - multifocal cortical  and subcortical small infarcts - etiology unclear, but still more likely due to long term uncontrolled risk factors.  CTA head & neck: No LVO. Unchanged multifocal severe intracranial stenoses, including severe stenosis of the left anterior cerebral artery A2 segment and severe bilateral MCA distal M1 and proximal M2 stenoses.  MRI Once again several new or increased acute infarcts are  demonstrated in both cerebral hemispheres (today the bilateral MCA and left ACA territories) since the recent MRI 06/04/2020. Stable or expected evolution of ischemia elsewhere, including developing encephalomalacia in the left parietal lobe and the left cerebellum (PICA) territories.  LDL 39  HgbA1c 10.3  Unremarkable repeat ESR 15 and CRP 0.8 on 6/2   VTE prophylaxis - On Lovenox PPX  On DAPT with ASA 325 and Plavix prior to admission, significant other, [name], reports she is managing medications and can report he has been taking reliably, now on ASA 81 and brilinta 90 bid. Continue DAPT for 30 days and then ASA 325 and plavix 75 for another 2 months and then plavix alone  Therapy recommendations: CIR  Disposition:  CIR  I was able to talk to [facility] stroke center Dr. Nancy Fetter.  After he reviewed extensively patient chart and images and also discussed with his colleagues at [facility], he called me back and we had long discussion regarding patient possible stroke etiology.  Currently, he still thinking that patient longstanding uncontrolled risk factors including diabetes, hypertension, hyperlipidemia, substance use, smoking contributed to his recent recurrent strokes, he does not think further work-up indicated at this time as we have done extensive work-up recently.  Patient would be in high risk for recurrent stroke especially within 3 months of first stroke which is a known fact. Further treatment will be focused on more rigorous risk factor control, agree with continued DAPT treatment.  Currently he does not  think transfer to [facility] is needed at this time.  History of strokes  Stroke in 04/2020 with aphasia, intermittent headache and dizziness. CT showed old left cerebellum and left parieto-occipital infarct. MRI showed left MCA patchy infarcts. MRAand CTA head and neck showed bilateral MCA branch high-grade stenosis. EF 60 to 65%. LDL 53, A1c 11.1, UDS positive for cocaine. Patient discharged with DAPT and Lipitor 40.  Admitted again on 05/31/2020 for increased weakness and slurred speech.  Patient MRI showed bilateral ACA scattered left more than right acute infarct. Also had interval bilateral MCA small infarcts from last stroke. CTA head and neck showed prominent intracranial atherosclerosis, new occlusion versus high-grade stenosis at the left A2. High-grade stenosis bilateral MCA vascular tree with persistent occlusion of distal left M3 and worsening stenosis right M3. A1c 10.3, LDL 39, UDS negative this time. TEE no source of emboli, no PFO. Cerebral angioOccluded Lt. MCA Sup division in mid to distal M2 seg. Severe tandem stenosis of RTACA distal A2 and prox A3. Mod severe focal stenosis of Lt MCA inf division parietal branch. Focal stenosis of Lt ACA prox pericallosal branches. Discussed with Dr. Estanislado Pandy, angiogram not consistent with vasculitis pattern.  Discharged with aspirin 325 and Plavix DAPT for 3 months.  Admitted again on 06/04/2020 for right lower extremity weakness after a fall.  Stat CT no interval changes.  CTA head and neck stable focal occlusion versus severe stenosis left A2, progressive vessel irregularity with multiple progressive severe stenosis within left ACA vascular tree.  Progressive severe focal stenosis within the proximal left M2.  Stable severe stenosis bilateral M2 and M3.  Moderate stenosis left P2.  MRI brain with recurrent several small areas of acute infarct including right frontal cortex, right parietal white matter and cortex, left cerebellum.  Given  recurrent embolic pattern, concerning for vasculitis. LP 5/20 was bland, with normal WBC and protein, as well as normal CSF IgG index. CSF OCBs were negative. VDRL was negative. His CRP was elevated at 5.3 and ESR was 57. Hypercoagulable panel was unremarkable. Tox screen on 5/21 was negative. VZV IgM on 5/20 was negative. CSF VZV PCR could not be performed due to insufficient sample. Lupus panel was negtive. Copmlement levels were not decreased, but C3 complement level was elevated at 203. Proetein C and S were normal. PT and PTT were normal. Lupus anticoagulant screen was negative. DRVVT normal. Beta2 glycoprotein Abs were normal. Vitamin B12 normal. Homocysteine normal. Ferritin normal. Cholesterol panel negative except for decreased HDL.  He was transferred to [facility] for family request. He was treated with IV Solumedrol empirically, 1000 mg IV qd for 3 days over there, reported slight improvement, but caused significant elevation of blood glucose which required endocrinology consultation. In [place], stroke etiology still considered as uncontrolled risk factor, felt vasculitis less likely. He was discharged with  DAPT and statin.  Concern for seizure  Started Keppra for "staring spells" on repeat ER visit 5/19  On Keppra currently -> increase to 1g bid  Spot EEG: This study is suggestive of cortical dysfunction in right hemisphere likely secondary to underlying stroke. Additionally, there is moderate diffuse encephalopathy, non specific etiology. No seizures or epileptiform discharges were seen throughout the recording.  Continuous EEG: This studyshowed lateralized rhythmic delta activity which is on the ictal-interictal continuum with low potential for seizures and is suggestive of cortical dysfunction in right hemisphere likely secondary to underlying stroke. Additionally, there is moderate diffuse encephalopathy, non specific etiology.No seizures or epileptiform discharges were seen throughout the  recording.  If EEG no seizure, will d/c LTM EEG in am  Hypertension . Stable  . Long-term BP goal 130-150 given severe multifocal intracranial stenosis  Hyperlipidemia  Home meds: resumed in hospital  LDL 39, at goal < 70  High intensity statin: Lipitor 105m resumed  Continue statin at discharge  Diabetes type II Uncontrolled  HgbA1c 10.3, goal < 7.0  CBGs  SSI  Management per primary team  Close PCP follow up and management  Other Stroke Risk Factors  History of cocaine use a few months a go, but abstinent  since then both by report and clear UDS.  ETOH use, alcohol level <10, advised to drink no more than1drink(s) a day  Obesity,Body mass index is 32.95 kg/m., BMI >/= 30 associated with increased stroke risk, recommend weight loss, diet and exercise as appropriate  High risk for OSA.   Other Active Problems    Hospital day # 3  Delila A Bailey-Modzik, NP-C  ATTENDING NOTE: I reviewed above note and agree with the assessment and plan. Pt was seen and examined.   No acute event overnight, patient neurologically unchanged, still has aphasia.  EEG showed lateralized rhythmic delta activity which is on the ictal-interictal continuum with low potential for seizures and is suggestive of cortical dysfunction in right hemisphere likely secondary to underlying stroke.  Discussed with Dr. YHortense Ramal will increase Keppra to 1 g twice daily to see if any EEG improvement. Likely d/c LTM EEG in am, if no significant change overnight.  Continue aspirin and Brilinta DAPT and Lipitor 40.  Stroke risk factor modification.  PT/OT recommend CIR.  For detailed assessment and plan, please refer to above as I have made changes wherever appropriate.   JRosalin Hawking MD PhD Stroke Neurology 06/18/2020 5:55 PM     To contact Stroke Continuity provider, please refer to [email or web address] After hours, contact General Neurology

## 2020-06-18 NOTE — Progress Notes (Signed)
Physical Therapy Treatment Patient Details Name: Taijon Vink MRN: [id number] DOB: 17-Apr-1975 Today's Date: 06/18/2020    History of Present Illness Mr. Kampf is a 45 yo male admitted on 06/15/2020 with c/o aphasia,  with multiple recent admits for repeated strokes. Pt most recently admitted 5/20-5/23 for acute strokes before being transferred at family request to [facility] for further work-up. While in the ED, pt had worsening aphasia compared to baseline. MRI reveals several new or increased acute infarcts are  demonstrated in both cerebral hemispheres (today the bilateral MCA  and left ACA territories) since the recent MRI 06/04/2020. PMH includes HTN, DM2, HLD, multiple recent admits for repeated strokes.    PT Comments    Patient received in bed, parents present but left as I arrived. Patient labile at times throughout session. Pleasant and cooperative.  He requires multimodal cues for mobility due to difficulty sequencing tasks. Performed supine to sit with increased time and mod assist. Sit to stand with min assist/cga. Patient ambulated 3 laps to door and back for approx 60 feet total. Hand held assist. Demonstrates good balance mainly limited by awareness and attention. Patient will continue to benefit from skilled PT while here to improve mobility and return to prior functional level.    Follow Up Recommendations  CIR     Equipment Recommendations       Recommendations for Other Services Rehab consult     Precautions / Restrictions Precautions Precaution Comments: expressive and receptive aphasia Restrictions Weight Bearing Restrictions: No Other Position/Activity Restrictions: currently on continuous EEG    Mobility  Bed Mobility Overal bed mobility: Needs Assistance Bed Mobility: Supine to Sit;Sit to Supine     Supine to sit: Mod assist Sit to supine: Mod assist   General bed mobility comments: increased time and multimodal cueing.    Transfers Overall transfer level:  Needs assistance Equipment used: 1 person hand held assist;None Transfers: Sit to/from Stand Sit to Stand: Min guard            Ambulation/Gait Ambulation/Gait assistance: Min Emergency planning/management officer (Feet): 60 Feet Assistive device: 1 person hand held assist;None Gait Pattern/deviations: Step-to pattern Gait velocity: reduced   General Gait Details: PAtient has good static standing balance, min guard for dynamic mobility. Requires multimodal cues for direction, attention during activity.   Stairs             Wheelchair Mobility    Modified Rankin (Stroke Patients Only) Modified Rankin (Stroke Patients Only) Pre-Morbid Rankin Score: Moderate disability Modified Rankin: Moderately severe disability     Balance Overall balance assessment: Needs assistance Sitting-balance support: Feet supported Sitting balance-Leahy Scale: Good     Standing balance support: No upper extremity supported;During functional activity Standing balance-Leahy Scale: Fair                              Cognition Arousal/Alertness: Awake/alert Behavior During Therapy: Flat affect Overall Cognitive Status: No family/caregiver present to determine baseline cognitive functioning Area of Impairment: Following commands;Safety/judgement;Awareness;Problem solving                   Current Attention Level: Focused   Following Commands: Follows one step commands inconsistently;Follows one step commands with increased time Safety/Judgement: Decreased awareness of safety;Decreased awareness of deficits Awareness: Intellectual Problem Solving: Difficulty sequencing;Requires verbal cues;Requires tactile cues;Slow processing General Comments: Patient being seen for outpatient OT for:  Attention and concentration deficit, 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      Exercises      General Comments        Pertinent Vitals/Pain Pain Assessment: Faces Faces Pain Scale: No hurt    Home Living                      Prior Function            PT Goals (current goals can now be found in the care plan section) Acute Rehab PT Goals Patient Stated Goal: not stated PT Goal Formulation: Patient unable to participate in goal setting Time For Goal Achievement: 07/01/20 Progress towards PT goals: Progressing toward goals    Frequency    Min 4X/week      PT Plan Current plan remains appropriate    Co-evaluation              AM-PAC PT "6 Clicks" Mobility   Outcome Measure  Help needed turning from your back to your side while in a flat bed without using bedrails?: A Little Help needed moving from lying on your back to sitting on the side of a flat bed without using bedrails?: A Lot Help needed moving to and from a bed to a chair (including a wheelchair)?: A Little Help needed standing up from a chair using your arms (e.g., wheelchair or bedside chair)?: A Little Help needed to walk in hospital room?: A Little Help needed climbing 3-5 steps with a railing? : A Lot 6 Click Score: 16    End of Session Equipment Utilized During Treatment: Gait belt Activity Tolerance: Patient tolerated treatment well Patient left: in bed;with call bell/phone within reach;with bed alarm set Nurse Communication: Mobility status PT Visit Diagnosis: Unsteadiness on feet (R26.81);Other abnormalities of gait and mobility (R26.89);Muscle weakness (generalized) (M62.81);Difficulty in walking, not elsewhere classified (R26.2);Apraxia (R48.2)     Time: 1330-1400 PT Time Calculation (min) (ACUTE ONLY): 30 min  Charges:  $Gait Training: 23-37 mins                     [employer], PT, GCS 06/18/20,2:12 PM

## 2020-06-18 NOTE — Procedures (Addendum)
Patient Name: Nathaniel Spears  MRN: [id number]  Epilepsy Attending: Charlsie Quest  Referring Physician/Provider: Shon Hale, NP Duration: 06/17/2020 4403 to 06/18/2020 4742  Patient history: 45 year old male with multiple strokes and seizure-like activity.  EEG to evaluate for seizures.  Level of alertness: Awake, asleep  AEDs during EEG study: Keppra  Technical aspects: This EEG study was done with scalp electrodes positioned according to the 10-20 International system of electrode placement. Electrical activity was acquired at a sampling rate of 500Hz  and reviewed with a high frequency filter of 70Hz  and a low frequency filter of 1Hz . EEG data were recorded continuously and digitally stored.   Description: No clear posterior dominant rhythm was seen. EEG showed continuous generalized 3 to 6 Hz theta-delta slowing as well as intermittent generalized and lateralized right hemisphere rhythmic 2 to 3 Hz delta slowing.  Hyperventilation and photic stimulation were not performed.     ABNORMALITY - Continuous slow, generalized -Intermittent rhythmic delta slowing, generalized and lateralized right hemisphere   IMPRESSION: This study  showed lateralized rhythmic delta activity which is on the ictal-interictal continuum with low potential for seizures and is suggestive of cortical dysfunction in right hemisphere likely secondary to underlying stroke. Additionally, there is moderate diffuse encephalopathy, non specific etiology. No seizures or epileptiform discharges were seen throughout the recording.  Duchess Armendarez 

## 2020-06-19 DIAGNOSIS — I6320 Cerebral infarction due to unspecified occlusion or stenosis of unspecified precerebral arteries: Secondary | ICD-10-CM

## 2020-06-19 DIAGNOSIS — R569 Unspecified convulsions: Secondary | ICD-10-CM

## 2020-06-19 DIAGNOSIS — I639 Cerebral infarction, unspecified: Principal | ICD-10-CM

## 2020-06-19 LAB — GLUCOSE, CAPILLARY
Glucose-Capillary: 102 mg/dL — ABNORMAL HIGH (ref 70–99)
Glucose-Capillary: 109 mg/dL — ABNORMAL HIGH (ref 70–99)
Glucose-Capillary: 159 mg/dL — ABNORMAL HIGH (ref 70–99)
Glucose-Capillary: 144 mg/dL — ABNORMAL HIGH (ref 70–99)
Glucose-Capillary: 120 mg/dL — ABNORMAL HIGH (ref 70–99)
Glucose-Capillary: 104 mg/dL — ABNORMAL HIGH (ref 70–99)

## 2020-06-19 MED ORDER — INSULIN ASPART 100 UNIT/ML IJ SOLN
0.0000 [IU] | Freq: Three times a day (TID) | INTRAMUSCULAR | Status: DC
  Administered 2020-06-19: 1 [IU] via SUBCUTANEOUS
  Administered 2020-06-19: 2 [IU] via SUBCUTANEOUS
  Administered 2020-06-22 – 2020-06-23 (×2): 1 [IU] via SUBCUTANEOUS

## 2020-06-19 MED ORDER — LEVETIRACETAM IN NACL 1000 MG/100ML IV SOLN
1000.0000 mg | Freq: Two times a day (BID) | INTRAVENOUS | Status: DC
  Administered 2020-06-19 – 2020-06-21 (×4): 1000 mg via INTRAVENOUS
  Filled 2020-06-19 (×4): qty 100

## 2020-06-19 NOTE — Progress Notes (Signed)
LTM maint complete - no skin breakdown under:  A1 A2 F8 Fp2 F4

## 2020-06-19 NOTE — Progress Notes (Signed)
PROGRESS NOTE    Irven Ingalsbe  ZYY:[id number] DOB: 06-Apr-1975 DOA: 06/15/2020 PCP: Grayce Sessions, NP     Brief Narrative:  Correy Weidner is a 45 year old male with past medical history significant for hypertension, diabetes, hyperlipidemia who presented to the hospital with chief complaint of aphasia.  Patient has been hospitalized for recurrent stroke since 04/20/2020.  Patient was admitted from 5/22 5/23 for acute strokes, transfer to [facility] for further neurologic work-up.  Work-up for CNS vasculitis was negative.  He now returns back to the emergency department with complaints of aphasia.  Neurology consulted.   New events last 24 hours / Subjective: Patient remains on LTM EEG this morning.  Remains aphasic.  He voices no new complaints, feeling well overall.   Assessment & Plan:   Principal Problem:   Acute ischemic stroke [facility] Active Problems:   Essential hypertension   Type 2 diabetes mellitus with hyperglycemia, with long-term current use of insulin (HCC)   Recurrent stroke -MRI brain showed several new or increased acute infarcts in both cerebral hemispheres -CTA head and neck: No emergent large vessel occlusion, unchanged multifocal severe intracranial stenosis -Neurology following -EEG showed lateralized rhythmic delta activity which is on the ictal-interictal continuum with low potential for seizures and is suggestive of cortical dysfunction in right hemisphere likely secondary to underlying stroke. Additionally, there is moderate diffuse encephalopathy, non specific etiology.No seizures or epileptiform discharges were seen throughout the recording. -Aspirin/plavix PTA, statin --> changed to aspirin/Brilinta -Keppra (started for seizure prophylaxis in May) --> dose increased  -PT OT SLP recommending CIR  Hypertension -Allow permissive hypertension in setting of acute stroke  Diabetes mellitus type 2 -Continue Levemir, sliding scale insulin     DVT  prophylaxis:  enoxaparin (LOVENOX) injection 40 mg Start: 06/16/20 1000  Code Status:     Code Status Orders  (From admission, onward)         Start     Ordered   06/15/20 2305  Full code  Continuous        06/15/20 2308        Code Status History    Date Active Date Inactive Code Status Order ID Comments User Context   06/04/2020 2222 06/07/2020 0623 Full Code [id number]  Marinda Elk, MD Inpatient   05/30/2020 2103 06/03/2020 1524 Full Code [id number]  Lewie Chamber, MD ED   04/20/2020 1922 04/23/2020 1909 Full Code [id number]  Charlsie Quest, MD ED   Advance Care Planning Activity     Family Communication: No family at bedside  Disposition Plan:  Status is: Inpatient  Remains inpatient appropriate because:Unsafe d/c plan   Dispo: The patient is from: Home              Anticipated d/c is to: CIR               Patient currently is medically stable to d/c. Awaiting final neuro recs, CIR placement.    Difficult to place patient No      Consultants:   Neurology  Procedures:   None   Antimicrobials:  Anti-infectives (From admission, onward)   None       Objective: Vitals:   06/18/20 2336 06/19/20 0333 06/19/20 0827 06/19/20 1123  BP: (!) 145/97 124/83 (!) 141/108 (!) 142/103  Pulse: 75 74 79 75  Resp: 18 16 16 16   Temp: 98 F (36.7 C) 98.4 F (36.9 C) 98.2 F (36.8 C) 98 F (36.7 C)  TempSrc: Oral Oral Oral Oral  SpO2:  100% 99%  100%  Weight:      Height:        Intake/Output Summary (Last 24 hours) at 06/19/2020 1129 Last data filed at 06/19/2020 0800 Gross per 24 hour  Intake 660 ml  Output 400 ml  Net 260 ml   Filed Weights   06/15/20 2146  Weight: 115 kg    Examination: General exam: Appears calm and comfortable  Respiratory system: Clear to auscultation. Respiratory effort normal. Cardiovascular system: S1 & S2 heard, RRR. No pedal edema. Gastrointestinal system: Abdomen is nondistended, soft and nontender. Normal bowel sounds  heard. Central nervous system: Alert, +expressive aphasia  Extremities: Symmetric in appearance bilaterally  Skin: No rashes, lesions or ulcers on exposed skin  Psychiatry: Mood & affect appropriate.    Data Reviewed: I have personally reviewed following labs and imaging studies  CBC: Recent Labs  Lab 06/15/20 2052 06/15/20 2058  WBC 8.9  --   NEUTROABS 4.2  --   HGB 12.9* 13.6  HCT 40.1 40.0  MCV 80.4  --   PLT 334  --    Basic Metabolic Panel: Recent Labs  Lab 06/15/20 2052 06/15/20 2058 06/16/20 0735 06/17/20 0428 06/18/20 0130  NA 138 139 139 137 142  K 3.4* 3.4* 2.5* 3.8 3.6  CL 99 98 110 99 107  CO2 29  --  22 30 28   GLUCOSE 109* 108* 85 88 97  BUN 18 20 11 10 11   CREATININE 1.43* 1.50* 0.88 1.16 1.12  CALCIUM 9.1  --  6.8* 8.8* 8.9  MG  --   --  1.4* 1.8  --    GFR: Estimated Creatinine Clearance: 109.1 mL/min (by C-G formula based on SCr of 1.12 mg/dL). Liver Function Tests: Recent Labs  Lab 06/15/20 2052  AST 12*  ALT 15  ALKPHOS 57  BILITOT 0.8  PROT 6.7  ALBUMIN 3.5   No results for input(s): LIPASE, AMYLASE in the last 168 hours. No results for input(s): AMMONIA in the last 168 hours. Coagulation Profile: Recent Labs  Lab 06/15/20 2052  INR 1.1   Cardiac Enzymes: No results for input(s): CKTOTAL, CKMB, CKMBINDEX, TROPONINI in the last 168 hours. BNP (last 3 results) No results for input(s): PROBNP in the last 8760 hours. HbA1C: No results for input(s): HGBA1C in the last 72 hours. CBG: Recent Labs  Lab 06/18/20 2000 06/18/20 2315 06/19/20 0317 06/19/20 0819 06/19/20 1128  GLUCAP 117* 112* 102* 109* 159*   Lipid Profile: No results for input(s): CHOL, HDL, LDLCALC, TRIG, CHOLHDL, LDLDIRECT in the last 72 hours. Thyroid Function Tests: No results for input(s): TSH, T4TOTAL, FREET4, T3FREE, THYROIDAB in the last 72 hours. Anemia Panel: No results for input(s): VITAMINB12, FOLATE, FERRITIN, TIBC, IRON, RETICCTPCT in the last 72  hours. Sepsis Labs: No results for input(s): PROCALCITON, LATICACIDVEN in the last 168 hours.  Recent Results (from the past 240 hour(s))  Resp Panel by RT-PCR (Flu A&B, Covid) Nasopharyngeal Swab     Status: None   Collection Time: 06/15/20 10:26 PM   Specimen: Nasopharyngeal Swab; Nasopharyngeal(NP) swabs in vial transport medium  Result Value Ref Range Status   SARS Coronavirus 2 by RT PCR NEGATIVE NEGATIVE Final    Comment: (NOTE) SARS-CoV-2 target nucleic acids are NOT DETECTED.  The SARS-CoV-2 RNA is generally detectable in upper respiratory specimens during the acute phase of infection. The lowest concentration of SARS-CoV-2 viral copies this assay can detect is 138 copies/mL. A negative result does not preclude SARS-Cov-2 infection and should  not be used as the sole basis for treatment or other patient management decisions. A negative result may occur with  improper specimen collection/handling, submission of specimen other than nasopharyngeal swab, presence of viral mutation(s) within the areas targeted by this assay, and inadequate number of viral copies(<138 copies/mL). A negative result must be combined with clinical observations, patient history, and epidemiological information. The expected result is Negative.  Fact Sheet for Patients:  BloggerCourse.com  Fact Sheet for Healthcare Providers:  SeriousBroker.it  This test is no t yet approved or cleared by the [place] FDA and  has been authorized for detection and/or diagnosis of SARS-CoV-2 by FDA under an Emergency Use Authorization (EUA). This EUA will remain  in effect (meaning this test can be used) for the duration of the COVID-19 declaration under Section 564(b)(1) of the Act, 21 U.S.C.section 360bbb-3(b)(1), unless the authorization is terminated  or revoked sooner.       Influenza A by PCR NEGATIVE NEGATIVE Final   Influenza B by PCR NEGATIVE  NEGATIVE Final    Comment: (NOTE) The Xpert Xpress SARS-CoV-2/FLU/RSV plus assay is intended as an aid in the diagnosis of influenza from Nasopharyngeal swab specimens and should not be used as a sole basis for treatment. Nasal washings and aspirates are unacceptable for Xpert Xpress SARS-CoV-2/FLU/RSV testing.  Fact Sheet for Patients: BloggerCourse.com  Fact Sheet for Healthcare Providers: SeriousBroker.it  This test is not yet approved or cleared by the [place] FDA and has been authorized for detection and/or diagnosis of SARS-CoV-2 by FDA under an Emergency Use Authorization (EUA). This EUA will remain in effect (meaning this test can be used) for the duration of the COVID-19 declaration under Section 564(b)(1) of the Act, 21 U.S.C. section 360bbb-3(b)(1), unless the authorization is terminated or revoked.  Performed at [facility], [address] [place], [place] [address]       Radiology Studies: Overnight EEG with video  Result Date: 06/18/2020 Charlsie Quest, MD     06/19/2020  8:54 AM Patient Name: Derreck Wiltsey MRN: [id number] Epilepsy Attending: Charlsie Quest Referring Physician/Provider: Shon Hale, NP Duration: 06/17/2020 3818 to 06/18/2020 0955  Patient history: 45 year old male with multiple strokes and seizure-like activity.  EEG to evaluate for seizures.  Level of alertness: Awake, asleep  AEDs during EEG study: Keppra  Technical aspects: This EEG study was done with scalp electrodes positioned according to the 10-20 International system of electrode placement. Electrical activity was acquired at a sampling rate of 500Hz  and reviewed with a high frequency filter of 70Hz  and a low frequency filter of 1Hz . EEG data were recorded continuously and digitally stored.  Description: No clear posterior dominant rhythm was seen. EEG showed continuous generalized 3 to 6 Hz theta-delta slowing as well as  intermittent generalized and lateralized right hemisphere rhythmic 2 to 3 Hz delta slowing.  Hyperventilation and photic stimulation were not performed.    ABNORMALITY - Continuous slow, generalized -Intermittent rhythmic delta slowing, generalized and lateralized right hemisphere  IMPRESSION: This study  showed lateralized rhythmic delta activity which is on the ictal-interictal continuum with low potential for seizures and is suggestive of cortical dysfunction in right hemisphere likely secondary to underlying stroke. Additionally, there is moderate diffuse encephalopathy, non specific etiology. No seizures or epileptiform discharges were seen throughout the recording.  Priyanka      Scheduled Meds: . aspirin EC  81 mg Oral Daily  . atorvastatin  80 mg Oral Daily  . enoxaparin (LOVENOX)  injection  40 mg Subcutaneous Q24H  . insulin aspart  0-9 Units Subcutaneous Q4H  . insulin detemir  10 Units Subcutaneous QHS  . ticagrelor  90 mg Oral BID   Continuous Infusions: . levETIRAcetam 1,500 mg (06/19/20 0859)     LOS: 4 days      Time spent: 20 minutes   Noralee StainJennifer Sevan Mcbroom, DO [facility] 06/19/2020, 11:29 AM   Available via Epic secure chat 7am-7pm After these hours, please refer to coverage provider listed on [email or web address]

## 2020-06-19 NOTE — Progress Notes (Signed)
Physical Therapy Treatment Patient Details Name: Nathaniel Spears MRN: [id number] DOB: 07-13-1975 Today's Date: 06/19/2020    History of Present Illness [name] is a 45 yo male admitted on 06/15/2020 with c/o aphasia,  with multiple recent admits for repeated strokes. Pt most recently admitted 5/20-5/23 for acute strokes before being transferred at family request to [facility] for further work-up. While in the ED, pt had worsening aphasia compared to baseline. MRI reveals several new or increased acute infarcts are  demonstrated in both cerebral hemispheres (today the bilateral MCA  and left ACA territories) since the recent MRI 06/04/2020. PMH includes HTN, DM2, HLD, multiple recent admits for repeated strokes.    PT Comments    The pt was eager to participate with PT session this session, but presents with flat affect and was significantly tearful and frustrated through today's session due to aphasia. The pt was able to demo good progress in ambulation tolerance, but required increased physical assist to steady and max directional cues. Pt with poor safety awareness or vision, turning into objects or to face wall prior to reaching hallway we were intending to walk down. The pt became increasingly tearful through the session due to expressive deficits, but required significantly increased time, cues, and multimodal cues to complete some simple tasks due to combination of receptive and expressive aphasia. The pt was inconsistently able to follow cues, for ex: able to stand from EOB, unable to scoot hips back in the chair, unable to point at room sign or blue tile on floor, but able to walk to blue tile. Continue to recommend max therapies to address pt balance deficits and improved safety with navigation in different rooms/environments to allow for eventual safe d/c.    Follow Up Recommendations  CIR     Equipment Recommendations  None recommended by PT    Recommendations for Other Services        Precautions / Restrictions Precautions Precautions: Fall Precaution Comments: expressive and receptive aphasia Restrictions Weight Bearing Restrictions: No    Mobility  Bed Mobility Overal bed mobility: Needs Assistance Bed Mobility: Rolling;Sidelying to Sit Rolling: Mod assist Sidelying to sit: Mod assist       General bed mobility comments: modA to initiate movements, multimodal cueing pt with good strength to hold rail, complete movement once started    Transfers Overall transfer level: Needs assistance Equipment used: 1 person hand held assist Transfers: Sit to/from Stand Sit to Stand: Min assist;Min guard         General transfer comment: minA to power up with HHA and steady. pt initially with slight A-P sway. Able to progress to some sit-stand with minG  Ambulation/Gait Ambulation/Gait assistance: Min assist Gait Distance (Feet): 150 Feet Assistive device: 1 person hand held assist Gait Pattern/deviations: Step-through pattern;Decreased stride length;Narrow base of support Gait velocity: reduced Gait velocity interpretation: <1.8 ft/sec, indicate of risk for recurrent falls General Gait Details: pt with slow but steady gait, able to maintain with no challenge, but needing directions and cues for all directions and wayfinding. pt walking in high-guard position with increased sway, suspect would have significant challenge with dynamic challenges      Modified Rankin (Stroke Patients Only) Modified Rankin (Stroke Patients Only) Pre-Morbid Rankin Score: Moderate disability Modified Rankin: Moderately severe disability     Balance Overall balance assessment: Needs assistance Sitting-balance support: Feet supported Sitting balance-Leahy Scale: Good   Postural control: Posterior lean Standing balance support: Single extremity supported;During functional activity Standing balance-Leahy Scale: Poor Standing balance  comment: single UE support for balance              High level balance activites: Backward walking;Other (comment) High Level Balance Comments: moderate impariment with need for physical assist, repeated multimodal cues, and modA to recover balance            Cognition Arousal/Alertness: Awake/alert Behavior During Therapy: Flat affect Overall Cognitive Status: Impaired/Different from baseline Area of Impairment: Following commands;Safety/judgement;Awareness;Problem solving                   Current Attention Level: Focused Memory: Decreased recall of precautions Following Commands: Follows one step commands inconsistently;Follows one step commands with increased time Safety/Judgement: Decreased awareness of safety;Decreased awareness of deficits Awareness: Intellectual Problem Solving: Difficulty sequencing;Requires verbal cues;Requires tactile cues;Slow processing General Comments: pt previously being seen by OT for deficits in attention, R-sided inattention, and executive function deficits. presented today with significant tearfulness, very frustrated by situation and expressive difficulties. Pt stating "yeah" consistently through session, but seemingly with limited accuracy. Pt unable to follow simple commands such as point to the sign or point to the blue square, but was able to walk to the blue square and complete simple transfers.      Exercises      General Comments General comments (skin integrity, edema, etc.): EEG removed during session      Pertinent Vitals/Pain Pain Assessment: Faces Faces Pain Scale: No hurt Pain Location: pt denied pain today Pain Intervention(s): Monitored during session           PT Goals (current goals can now be found in the care plan section) Acute Rehab PT Goals Patient Stated Goal: not stated PT Goal Formulation: Patient unable to participate in goal setting Time For Goal Achievement: 07/01/20 Potential to Achieve Goals: Good Progress towards PT goals: Progressing  toward goals    Frequency    Min 4X/week      PT Plan Current plan remains appropriate       AM-PAC PT "6 Clicks" Mobility   Outcome Measure  Help needed turning from your back to your side while in a flat bed without using bedrails?: A Little Help needed moving from lying on your back to sitting on the side of a flat bed without using bedrails?: A Lot Help needed moving to and from a bed to a chair (including a wheelchair)?: A Little Help needed standing up from a chair using your arms (e.g., wheelchair or bedside chair)?: A Little Help needed to walk in hospital room?: A Little Help needed climbing 3-5 steps with a railing? : A Lot 6 Click Score: 16    End of Session Equipment Utilized During Treatment: Gait belt Activity Tolerance: Patient tolerated treatment well Patient left: in chair;with call bell/phone within reach;with chair alarm set;with family/visitor present;with nursing/sitter in room Nurse Communication: Mobility status PT Visit Diagnosis: Unsteadiness on feet (R26.81);Other abnormalities of gait and mobility (R26.89);Muscle weakness (generalized) (M62.81);Difficulty in walking, not elsewhere classified (R26.2);Apraxia (R48.2)     Time: 5638-7564 PT Time Calculation (min) (ACUTE ONLY): 52 min  Charges:  $Gait Training: 23-37 mins $Therapeutic Activity: 8-22 mins $Neuromuscular Re-education: 8-22 mins                     Rolm Baptise, PT, DPT   Acute Rehabilitation Department Pager #: [phone]   Gaetana Michaelis 06/19/2020, 1:02 PM

## 2020-06-19 NOTE — Progress Notes (Signed)
Inpatient Rehab Admissions:  Inpatient Rehab Consult received.  I met with patient at the bedside for rehabilitation assessment and to discuss goals and expectations of an inpatient rehab admission.  Pt acknowledged that he was interested in pursuing CIR. Pt was having difficulty answering AC's questions. Pt gave permission to contact girlfriend, Reino Bellis.  Briefly spoke with Reino Bellis on the phone. She was unable to talk at length today; she is supposed to call [facility] tomorrow.  Will continue to follow.    Signed: Gayland Curry, [place], [place] Admissions Coordinator [phone]

## 2020-06-19 NOTE — Progress Notes (Addendum)
STROKE TEAM PROGRESS NOTE   INTERVAL HISTORY Still with mixed aphasia, can mimic, but difficult to follow verbal only commands, which limits ROS and exam. EEG still continuous slow, generalized w/Intermittent rhythmic delta slowing, generalized and lateralized right hemisphere.   Vitals:   06/18/20 2317 06/18/20 2336 06/19/20 0333 06/19/20 0827  BP: (!) 134/108 (!) 145/97 124/83 (!) (P) 141/108  Pulse: 73 75 74 (P) 79  Resp: '18 18 16 ' (P) 16  Temp: 97.7 F (36.5 C) 98 F (36.7 C) 98.4 F (36.9 C) (P) 98.2 F (36.8 C)  TempSrc: Oral Oral Oral (P) Oral  SpO2: 97% 100% 99%   Weight:      Height:       CBC:  Recent Labs  Lab 06/15/20 2052 06/15/20 2058  WBC 8.9  --   NEUTROABS 4.2  --   HGB 12.9* 13.6  HCT 40.1 40.0  MCV 80.4  --   PLT 334  --    Basic Metabolic Panel:  Recent Labs  Lab 06/16/20 0735 06/17/20 0428 06/18/20 0130  NA 139 137 142  K 2.5* 3.8 3.6  CL 110 99 107  CO2 '22 30 28  ' GLUCOSE 85 88 97  BUN '11 10 11  ' CREATININE 0.88 1.16 1.12  CALCIUM 6.8* 8.8* 8.9  MG 1.4* 1.8  --    Lipid Panel: No results for input(s): CHOL, TRIG, HDL, CHOLHDL, VLDL, LDLCALC in the last 168 hours. HgbA1c: No results for input(s): HGBA1C in the last 168 hours. Urine Drug Screen:  Recent Labs  Lab 06/16/20 1020  LABOPIA NONE DETECTED  COCAINSCRNUR NONE DETECTED  LABBENZ NONE DETECTED  AMPHETMU NONE DETECTED  THCU NONE DETECTED  LABBARB NONE DETECTED    Alcohol Level  Recent Labs  Lab 06/15/20 2052  ETH <10    IMAGING past 24 hours No results found. PHYSICAL EXAM Mental Status: Awake and alert. Severe expressive and receptive aphasia. He was able to state his name today, but unable to answer any orientation questions. Unable to name 3 objects. Become frustrated with aphasia as evidenced by body language. He can mimic, but cannot follow pure verbal commands. Cranial Nerves: II: Blinks to threat in right hemifield. No blink to threat in left hemifield. PERRL.    III,IV, VI: Can track to the left and right conjugately but with coarse horizontal nystagmus during pursuits. Had difficulty following commands for testing of upgaze.  V: Grossly intact responses to tactile stimulation on left and right. Unable to formally assess sensation due to aphasia.  VII: At rest, left perioral muscles with decreased tone, but elevates left side of mouth more briskly than right when asked to smile.  VIII: Hearing intact to some questions and commands.  IX,X: Not following commands for assessment XI: Head is midline.  XII: Does not protrude tongue to command  Motor/Sensory: LUE: Mildly increased flexor tone. Slowly drifts to bed after passive elevation and release. Will grip examiner's hand and resist movement somewhat, but unable to formally test due to comprehension deficit. Reacts to pinch after a delay.  RUE: Drifts more slowly to bed than left with passive elevation and release. More briskly reacts to pinch than on the left. More spontaneous movement as well as more brisk and purposeful movement on the right relative to the left.  LLE: Drifts slowly to bed after passive elevation and release. Flexes at knee and hip when withdrawing to noxious with 4-/5 strength. Reaction to noxious is brisk.  RLE: Drifts less slowly to bed than  on left. Strength when withdrawing to noxious is 4/5. Reaction to noxious is brisker than on the left.  Plantars: Equivocal bilaterally  Cerebellar: No gross ataxia with limited assessment. Patient unable to follow complex commands for detailed testing.  Gait: Deferred   ASSESSMENT/PLAN [name] is a 45 y.o. male with history of recent recurrent cryptogenic ischemic strokes 4/5-4/8, 5/15-19, repeat ER visit 5/19, 5/20-5/31 here and transferred to [facility], multifocal severe intracranial stenoses, negative work up for vasculitis last week, uncontrolled DM2, HLD, HTN, obesity and previous cocaine use. He was discharged and only home for a  day when EMS was activated for the current episode of acute aphasia (alert but no verbal response at all) x 30 minutes.   Stroke - multifocal cortical and subcortical small infarcts - etiology unclear, but still more likely due to long term uncontrolled risk factors.  CTA head & neck: No LVO. Unchanged multifocal severe intracranial stenoses, including severe stenosis of the left anterior cerebral artery A2 segment and severe bilateral MCA distal M1 and proximal M2 stenoses.  MRI Once again several new or increased acute infarcts are  demonstrated in both cerebral hemispheres (today the bilateral MCA and left ACA territories) since the recent MRI 06/04/2020. Stable or expected evolution of ischemia elsewhere, including developing encephalomalacia in the left parietal lobe and the left cerebellum (PICA) territories.  LDL 39  HgbA1c 10.3  Unremarkable repeat ESR 15 and CRP 0.8 on 6/2   VTE prophylaxis - On Lovenox PPX  On DAPT with ASA 325 and Plavix prior to admission, significant other, [name], reports she is managing medications and can report he has been taking reliably, now on ASA 81 and brilinta 90 bid. Continue DAPT for 30 days and then ASA 325 and plavix 75 for another 2 months and then plavix alone  Therapy recommendations: CIR  Disposition:  CIR  Dr Erlinda Hong was able to speak to [facility] stroke center, Dr. Nancy Fetter.  After he reviewed extensively patient chart and images and also discussed with his colleagues at [facility], he called me back and we had long discussion regarding patient possible stroke etiology.  Currently, he still thinking that patient longstanding uncontrolled risk factors including diabetes, hypertension, hyperlipidemia, substance use, smoking contributed to his recent recurrent strokes, he does not think further work-up indicated at this time as we have done extensive work-up recently.  Patient would be in high risk for recurrent stroke especially within 3 months of first  stroke which is a known fact. Further treatment will be focused on more rigorous risk factor control, agree with continued DAPT treatment.  Currently he does not think transfer to [facility] is needed at this time.  History of strokes  Stroke in 04/2020 with aphasia, intermittent headache and dizziness. CT showed old left cerebellum and left parieto-occipital infarct. MRI showed left MCA patchy infarcts. MRAand CTA head and neck showed bilateral MCA branch high-grade stenosis. EF 60 to 65%. LDL 53, A1c 11.1, UDS positive for cocaine. Patient discharged with DAPT and Lipitor 40.  Admitted again on 05/31/2020 for increased weakness and slurred speech.  Patient MRI showed bilateral ACA scattered left more than right acute infarct. Also had interval bilateral MCA small infarcts from last stroke. CTA head and neck showed prominent intracranial atherosclerosis, new occlusion versus high-grade stenosis at the left A2. High-grade stenosis bilateral MCA vascular tree with persistent occlusion of distal left M3 and worsening stenosis right M3. A1c 10.3, LDL 39, UDS negative this time. TEE no source of emboli, no  PFO. Cerebral angioOccluded Lt. MCA Sup division in mid to distal M2 seg. Severe tandem stenosis of RTACA distal A2 and prox A3. Mod severe focal stenosis of Lt MCA inf division parietal branch. Focal stenosis of Lt ACA prox pericallosal branches. Discussed with Dr. Estanislado Pandy, angiogram not consistent with vasculitis pattern.  Discharged with aspirin 325 and Plavix DAPT for 3 months.  Admitted again on 06/04/2020 for right lower extremity weakness after a fall.  Stat CT no interval changes.  CTA head and neck stable focal occlusion versus severe stenosis left A2, progressive vessel irregularity with multiple progressive severe stenosis within left ACA vascular tree.  Progressive severe focal stenosis within the proximal left M2.  Stable severe stenosis bilateral M2 and M3.  Moderate stenosis left P2.   MRI brain with recurrent several small areas of acute infarct including right frontal cortex, right parietal white matter and cortex, left cerebellum.  Given recurrent embolic pattern, concerning for vasculitis. LP 5/20 was bland, with normal WBC and protein, as well as normal CSF IgG index. CSF OCBs were negative. VDRL was negative. His CRP was elevated at 5.3 and ESR was 57. Hypercoagulable panel was unremarkable. Tox screen on 5/21 was negative. VZV IgM on 5/20 was negative. CSF VZV PCR could not be performed due to insufficient sample. Lupus panel was negtive. Copmlement levels were not decreased, but C3 complement level was elevated at 203. Proetein C and S were normal. PT and PTT were normal. Lupus anticoagulant screen was negative. DRVVT normal. Beta2 glycoprotein Abs were normal. Vitamin B12 normal. Homocysteine normal. Ferritin normal. Cholesterol panel negative except for decreased HDL.  He was transferred to [facility] for family request. He was treated with IV Solumedrol empirically, 1000 mg IV qd for 3 days over there, reported slight improvement, but caused significant elevation of blood glucose which required endocrinology consultation. In [place], stroke etiology still considered as uncontrolled risk factor, felt vasculitis less likely. He was discharged with DAPT and statin.  Concern for seizure  Started Keppra for "staring spells" on repeat ER visit 5/19  On Keppra currently -> increase to 1g bid  Spot EEG: This study is suggestive of cortical dysfunction in right hemisphere likely secondary to underlying stroke. Additionally, there is moderate diffuse encephalopathy, non specific etiology. No seizures or epileptiform discharges were seen throughout the recording.  Continuous EEG: This studyshowed lateralized rhythmic delta activity which is on the ictal-interictal continuum with low potential for seizures and is suggestive of cortical dysfunction in right hemisphere likely secondary to  underlying stroke. Additionally, there is moderate diffuse encephalopathy, non specific etiology.No seizures or epileptiform discharges were seen throughout the recording.  If EEG no seizure, will d/c LTM EEG tomorrow  Hypertension . Stable  . Long-term BP goal 130-150 given severe multifocal intracranial stenosis  Hyperlipidemia  Home meds: resumed in hospital  LDL 39, at goal < 70  High intensity statin: Lipitor 38m resumed  Continue statin at discharge  Diabetes type II Uncontrolled  HgbA1c 10.3, goal < 7.0  CBGs  SSI  Management per primary team  Close PCP follow up and management  Other Stroke Risk Factors  History of cocaine use a few months a go, but abstinent  since then both by report and clear UDS.  ETOH use, alcohol level <10, advised to drink no more than1drink(s) a day  Obesity,Body mass index is 32.95 kg/m., BMI >/= 30 associated with increased stroke risk, recommend weight loss, diet and exercise as appropriate  High risk for OSA.  Other [facility] day # [place], ARNP-C, ANVP-BC Pager: [phone] 06/19/2020 11:23 AM   Neurology Attending Attestation  I personally reviewed the chart and available images and discussed the diagnosis & management plan with the neurology APP.   I agree with the history, physical, assessment, and plan of care, with the following comments.  Pt with recurrent stroke with extensive work up and findings suggestive of severe ICAD. cEEG showed no seizure (? Seizure last night thought to be due to artifarct) will decrease Keppra dose again and monitor overnight   Please feel free to contact me with any questions or concerns.  Sincerely, Josiah Lobo, MD Neurohospitalist    To contact Stroke Continuity provider, please refer to [email or web address] After hours, contact General Neurology

## 2020-06-19 NOTE — Progress Notes (Signed)
LTM EEG discontinued - no skin breakdown at unhook.   

## 2020-06-19 NOTE — Procedures (Addendum)
Patient Name:Nathaniel Spears GUR:[id number] Epilepsy Attending:Tricia [place] Annabelle Harman Referring Physician/Provider:Delila Gena Fray, NP Duration:06/18/2020 [phone] to 06/19/2020   Patient history:45 year old male with multiple strokes and seizure-like activity. EEG to evaluate for seizures.  Level of alertness:Awake, asleep  AEDs during EEG study:Keppra  Technical aspects: This EEG study was done with scalp electrodes positioned according to the 10-20 International system of electrode placement. Electrical activity was acquired at a sampling rate of 500Hz  and reviewed with a high frequency filter of 70Hz  and a low frequency filter of 1Hz . EEG data were recorded continuously and digitally stored.   Description:No clear posterior dominant rhythm was seen. EEG showed continuous generalized 3 to 6 Hz theta-delta slowing as well as intermittent generalized and lateralized right hemisphere rhythmic 2 to 3 Hz delta slowing. Hyperventilation and photic stimulation were not performed.   ABNORMALITY - Continuous slow, generalized -Intermittent rhythmic delta slowing, generalized and lateralized right hemisphere   IMPRESSION: This study showed lateralized rhythmic delta activity which is on the ictal-interictal continuum with low potential for seizures and is suggestive of cortical dysfunction in right hemisphere likely secondary to underlying stroke. Additionally, there is moderate diffuse encephalopathy, non specific etiology.No seizures or epileptiform discharges were seen throughout the recording.  [name] 

## 2020-06-20 DIAGNOSIS — R4701 Aphasia: Secondary | ICD-10-CM

## 2020-06-20 LAB — GLUCOSE, CAPILLARY
Glucose-Capillary: 103 mg/dL — ABNORMAL HIGH (ref 70–99)
Glucose-Capillary: 103 mg/dL — ABNORMAL HIGH (ref 70–99)
Glucose-Capillary: 114 mg/dL — ABNORMAL HIGH (ref 70–99)
Glucose-Capillary: 100 mg/dL — ABNORMAL HIGH (ref 70–99)
Glucose-Capillary: 92 mg/dL (ref 70–99)

## 2020-06-20 MED ORDER — FLUOXETINE HCL 20 MG PO CAPS
20.0000 mg | ORAL_CAPSULE | Freq: Every day | ORAL | Status: DC
  Administered 2020-06-20 – 2020-06-23 (×4): 20 mg via ORAL
  Filled 2020-06-20 (×4): qty 1

## 2020-06-20 NOTE — Progress Notes (Signed)
STROKE TEAM PROGRESS NOTE   INTERVAL HISTORY Still with mixed aphasia, can mimic, but difficult to follow verbal only commands, which limits ROS and exam. No further seizures EEG.  Vitals:   06/19/20 1948 06/20/20 0011 06/20/20 0155 06/20/20 0353  BP: (!) 142/96 (!) 152/105 (!) 141/101 (!) 135/96  Pulse: 78 76 72 81  Resp: '16 18  18  ' Temp: 98 F (36.7 C) 97.7 F (36.5 C)  98.3 F (36.8 C)  TempSrc: Oral Oral  Oral  SpO2: 100% 100%  98%  Weight:      Height:       CBC:  Recent Labs  Lab 06/15/20 2052 06/15/20 2058  WBC 8.9  --   NEUTROABS 4.2  --   HGB 12.9* 13.6  HCT 40.1 40.0  MCV 80.4  --   PLT 334  --    Basic Metabolic Panel:  Recent Labs  Lab 06/16/20 0735 06/17/20 0428 06/18/20 0130  NA 139 137 142  K 2.5* 3.8 3.6  CL 110 99 107  CO2 '22 30 28  ' GLUCOSE 85 88 97  BUN '11 10 11  ' CREATININE 0.88 1.16 1.12  CALCIUM 6.8* 8.8* 8.9  MG 1.4* 1.8  --    Lipid Panel: No results for input(s): CHOL, TRIG, HDL, CHOLHDL, VLDL, LDLCALC in the last 168 hours. HgbA1c: No results for input(s): HGBA1C in the last 168 hours. Urine Drug Screen:  Recent Labs  Lab 06/16/20 1020  LABOPIA NONE DETECTED  COCAINSCRNUR NONE DETECTED  LABBENZ NONE DETECTED  AMPHETMU NONE DETECTED  THCU NONE DETECTED  LABBARB NONE DETECTED    Alcohol Level  Recent Labs  Lab 06/15/20 2052  ETH <10    IMAGING past 24 hours No results found. PHYSICAL EXAM Mental Status: Awake and alert. Severe expressive and receptive aphasia. He was able to state his name today, but unable to answer any orientation questions. He was able to repeat "June", but then kept stating "2028' for everything there after. Unable to name 3 objects. Become frustrated with aphasia as evidenced by body language. He can mimic when shown 2 fingers, but cannot follow pure verbal commands. Cranial Nerves: II: Blinks to threat in right hemifield. No blink to threat in left hemifield. PERRL.   III,IV, VI: Can track to the  left and right conjugately but with coarse horizontal nystagmus during pursuits. Had difficulty following commands for testing of upgaze.  V: Grossly intact responses to tactile stimulation on left and right. Unable to formally assess sensation due to aphasia.  VII: At rest, left perioral muscles with decreased tone, but elevates left side of mouth more briskly than right when asked to smile.  VIII: Hearing intact to some questions and commands.  IX,X: Not following commands for assessment XI: Head is midline.  XII: Does not protrude tongue to command  Motor/Sensory: LUE: Mildly increased flexor tone. Slowly drifts to bed after passive elevation and release. Will grip examiner's hand and resist movement somewhat, but unable to formally test due to comprehension deficit. Reacts to pinch after a delay.  RUE: Drifts more slowly to bed than left with passive elevation and release. More briskly reacts to pinch than on the left. More spontaneous movement as well as more brisk and purposeful movement on the right relative to the left.  LLE: Drifts slowly to bed after passive elevation and release. Flexes at knee and hip when withdrawing to noxious with 4-/5 strength. Reaction to noxious is brisk.  RLE: Drifts less slowly to bed  than on left. Strength when withdrawing to noxious is 4/5. Reaction to noxious is brisker than on the left.  Plantars: Equivocal bilaterally  Cerebellar: No gross ataxia with limited assessment. Patient unable to follow complex commands for detailed testing.  Gait: Deferred   ASSESSMENT/PLAN [name] is a 45 y.o. male with history of recent recurrent cryptogenic ischemic strokes 4/5-4/8, 5/15-19, repeat ER visit 5/19, 5/20-5/31 here and transferred to [facility], multifocal severe intracranial stenoses, negative work up for vasculitis last week, uncontrolled DM2, HLD, HTN, obesity and previous cocaine use. He was discharged and only home for a day when EMS was activated for  the current episode of acute aphasia (alert but no verbal response at all) x 30 minutes.   Stroke - multifocal cortical and subcortical small infarcts - etiology unclear, but still more likely due to long term uncontrolled risk factors.  CTA head & neck: No LVO. Unchanged multifocal severe intracranial stenoses, including severe stenosis of the left anterior cerebral artery A2 segment and severe bilateral MCA distal M1 and proximal M2 stenoses.  MRI Once again several new or increased acute infarcts are  demonstrated in both cerebral hemispheres (today the bilateral MCA and left ACA territories) since the recent MRI 06/04/2020. Stable or expected evolution of ischemia elsewhere, including developing encephalomalacia in the left parietal lobe and the left cerebellum (PICA) territories.  LDL 39  HgbA1c 10.3  Unremarkable repeat ESR 15 and CRP 0.8 on 6/2   VTE prophylaxis - On Lovenox PPX  On DAPT with ASA 325 and Plavix prior to admission, significant other, [name], reports she is managing medications and can report he has been taking reliably, now on ASA 81 and brilinta 90 bid. Continue DAPT for 30 days and then ASA 325 and plavix 75 for another 2 months and then plavix alone  Therapy recommendations: CIR  Disposition:  CIR  Dr Erlinda Hong was able to speak to [facility] stroke center, Dr. Nancy Fetter.  After he reviewed extensively patient chart and images and also discussed with his colleagues at [facility], he called me back and we had long discussion regarding patient possible stroke etiology.  Currently, he still thinking that patient longstanding uncontrolled risk factors including diabetes, hypertension, hyperlipidemia, substance use, smoking contributed to his recent recurrent strokes, he does not think further work-up indicated at this time as we have done extensive work-up recently.  Patient would be in high risk for recurrent stroke especially within 3 months of first stroke which is a known fact.  Further treatment will be focused on more rigorous risk factor control, agree with continued DAPT treatment.  Currently he does not think transfer to [facility] is needed at this time.  History of strokes  Stroke in 04/2020 with aphasia, intermittent headache and dizziness. CT showed old left cerebellum and left parieto-occipital infarct. MRI showed left MCA patchy infarcts. MRAand CTA head and neck showed bilateral MCA branch high-grade stenosis. EF 60 to 65%. LDL 53, A1c 11.1, UDS positive for cocaine. Patient discharged with DAPT and Lipitor 40.  Admitted again on 05/31/2020 for increased weakness and slurred speech.  Patient MRI showed bilateral ACA scattered left more than right acute infarct. Also had interval bilateral MCA small infarcts from last stroke. CTA head and neck showed prominent intracranial atherosclerosis, new occlusion versus high-grade stenosis at the left A2. High-grade stenosis bilateral MCA vascular tree with persistent occlusion of distal left M3 and worsening stenosis right M3. A1c 10.3, LDL 39, UDS negative this time. TEE no source of emboli,  no PFO. Cerebral angioOccluded Lt. MCA Sup division in mid to distal M2 seg. Severe tandem stenosis of RTACA distal A2 and prox A3. Mod severe focal stenosis of Lt MCA inf division parietal branch. Focal stenosis of Lt ACA prox pericallosal branches. Discussed with Dr. Estanislado Pandy, angiogram not consistent with vasculitis pattern.  Discharged with aspirin 325 and Plavix DAPT for 3 months.  Admitted again on 06/04/2020 for right lower extremity weakness after a fall.  Stat CT no interval changes.  CTA head and neck stable focal occlusion versus severe stenosis left A2, progressive vessel irregularity with multiple progressive severe stenosis within left ACA vascular tree.  Progressive severe focal stenosis within the proximal left M2.  Stable severe stenosis bilateral M2 and M3.  Moderate stenosis left P2.  MRI brain with recurrent several  small areas of acute infarct including right frontal cortex, right parietal white matter and cortex, left cerebellum.  Given recurrent embolic pattern, concerning for vasculitis. LP 5/20 was bland, with normal WBC and protein, as well as normal CSF IgG index. CSF OCBs were negative. VDRL was negative. His CRP was elevated at 5.3 and ESR was 57. Hypercoagulable panel was unremarkable. Tox screen on 5/21 was negative. VZV IgM on 5/20 was negative. CSF VZV PCR could not be performed due to insufficient sample. Lupus panel was negtive. Copmlement levels were not decreased, but C3 complement level was elevated at 203. Proetein C and S were normal. PT and PTT were normal. Lupus anticoagulant screen was negative. DRVVT normal. Beta2 glycoprotein Abs were normal. Vitamin B12 normal. Homocysteine normal. Ferritin normal. Cholesterol panel negative except for decreased HDL.  He was transferred to [facility] for family request. He was treated with IV Solumedrol empirically, 1000 mg IV qd for 3 days over there, reported slight improvement, but caused significant elevation of blood glucose which required endocrinology consultation. In [place], stroke etiology still considered as uncontrolled risk factor, felt vasculitis less likely. He was discharged with DAPT and statin.  Concern for seizure  Started Keppra for "staring spells" on repeat ER visit 5/19  On Keppra currently -> increase to 1g bid  Spot EEG: This study is suggestive of cortical dysfunction in right hemisphere likely secondary to underlying stroke. Additionally, there is moderate diffuse encephalopathy, non specific etiology. No seizures or epileptiform discharges were seen throughout the recording.  Continuous EEG: This studyshowed lateralized rhythmic delta activity which is on the ictal-interictal continuum with low potential for seizures and is suggestive of cortical dysfunction in right hemisphere likely secondary to underlying stroke. Additionally,  there is moderate diffuse encephalopathy, non specific etiology.No seizures or epileptiform discharges were seen throughout the recording. No further seizures seen on 6/4 and was d/c  Hypertension . Stable  . Long-term BP goal 130-150 given severe multifocal intracranial stenosis  Hyperlipidemia  Home meds: resumed in hospital  LDL 39, at goal < 70  High intensity statin: Lipitor 35m resumed  Continue statin at discharge  Diabetes type II Uncontrolled  HgbA1c 10.3, goal < 7.0  CBGs  SSI  Management per primary team  Close PCP follow up and management  Other Stroke Risk Factors  History of cocaine use a few months a go, but abstinent  since then both by report and clear UDS.  ETOH use, alcohol level <10, advised to drink no more than1drink(s) a day  Obesity,Body mass index is 32.95 kg/m., BMI >/= 30 associated with increased stroke risk, recommend weight loss, diet and exercise as appropriate  High risk for OSA.  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 We will s/o. Please call back if needed.   Lahna Nath Metzger-Cihelka, ARNP-C, ANVP-BC Pager: [phone] 06/20/2020 11:38 AM  To contact Stroke Continuity provider, please refer to [email or web address] After hours, contact General Neurology

## 2020-06-20 NOTE — Plan of Care (Signed)
  Problem: Education: Goal: Knowledge of General Education information will improve Description: Including pain rating scale, medication(s)/side effects and non-pharmacologic comfort measures Outcome: Progressing   Problem: Clinical Measurements: Goal: Ability to maintain clinical measurements within normal limits will improve Outcome: Progressing Goal: Will remain free from infection Outcome: Progressing Goal: Diagnostic test results will improve Outcome: Progressing Goal: Respiratory complications will improve Outcome: Progressing Goal: Cardiovascular complication will be avoided Outcome: Progressing   Problem: Ischemic Stroke/TIA Tissue Perfusion: Goal: Complications of ischemic stroke/TIA will be minimized Outcome: Progressing   

## 2020-06-20 NOTE — Plan of Care (Signed)

## 2020-06-20 NOTE — PMR Pre-admission (Signed)
PMR Admission Coordinator Pre-Admission Assessment  Patient: Nathaniel Spears is an 45 y.o., male MRN: [id number] DOB: [birth year] Height: 6' (182.9 cm) Weight: 115 kg              Insurance Information  PRIMARY: UNINSURED     SECONDARY:      none  Artist:       Phone#:   The Data processing manager" for patients in Inpatient Rehabilitation Facilities with attached "Privacy Act Statement-Health Care Records" was provided and verbally reviewed with: N/A  Emergency Contact Information Contact Information    Name Relation Home [place] Sister   [phone]   [name] Significant other   [phone]     Current Medical History  Patient Admitting Diagnosis: acute ischemic stroke (infarcts in both cerebral hemispheres)  History of Present Illness:Nathaniel Spears is a 45 year old right-handed male with history of hyperlipidemia, hypertension, diabetes mellitus, tobacco use.  Per chart review lives with spouse.  Two-level home 6 steps to entry.  Prior to initial CVA of April 2022 patient independent in all tasks.  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  He had received work-up at [facility] per neurology.  Current work-up was negative for vasculitis.  He returned to [facility] 06/15/2020 with word finding deficits.  MRI of the brain showed several new or increased acute infarcts in both cerebral hemispheres.  CTA of the head/ neck showed no large vessel occlusion.  Unchanged multifocal severe intracranial stenosis.  EEG was negative for seizure but did suggest cortical dysfunction in the right hemisphere secondary to underlying stroke.  It also showed diffuse encephalopathy nonspecific in etiology.  Maintained on Keppra for seizure prophylaxis.  Most recent echocardiogram with ejection fraction of 60 to 65% no wall motion abnormalities.  Patient was placed on aspirin and Plavix as  well as statin prior to arrival to [facility].  He was changed to aspirin 81 mg and Brilinta 90 mg BID that he would continue for 30 days then ASA 325 mg and PLAVIX 75 mg daily for another 2 months and then PLAVIX alone.  Lovenox initiated for DVT prophylaxis.  Tolerating a regular consistency diet.  Therapy evaluations completed recommendations of physical medicine rehab consult due to patient's aphasia, gait disorder sensory cognitive balance deficits was admitted for a comprehensive rehab program.   Complete NIHSS TOTAL: 8 Glasgow Coma Scale Score: 15  Past Medical History  Past Medical History:  Diagnosis Date  . Hypertension   . Stroke [facility])     Family History  family history is not on file.  Prior Rehab/Hospitalizations:  Has the patient had prior rehab or hospitalizations prior to admission? Yes  Has the patient had major surgery during 100 days prior to admission? Yes  Current Medications   Current Facility-Administered Medications:  .  acetaminophen (TYLENOL) tablet 650 mg, 650 mg, Oral, Q4H PRN, 650 mg at 06/18/20 2145 **OR** acetaminophen (TYLENOL) 160 MG/5ML solution 650 mg, 650 mg, Per Tube, Q4H PRN **OR** acetaminophen (TYLENOL) suppository 650 mg, 650 mg, Rectal, Q4H PRN, Hillary Bow, DO .  aspirin EC tablet 81 mg, 81 mg, Oral, Daily, Marvel Plan, MD, 81 mg at 06/20/20 0905 .  atorvastatin (LIPITOR) tablet 80 mg, 80 mg, Oral, Daily, Marvel Plan, MD, 80 mg at 06/20/20 0905 .  enoxaparin (LOVENOX) injection 40 mg, 40 mg, Subcutaneous, Q24H, Gardner, Jared M, DO, 40 mg at 06/20/20 0906 .  FLUoxetine (PROZAC) capsule  20 mg, 20 mg, Oral, Daily, Metzger-Cihelka, Desiree, NP, 20 mg at 06/20/20 1318 .  insulin aspart (novoLOG) injection 0-9 Units, 0-9 Units, Subcutaneous, TID WC, Noralee Stain, DO, 1 Units at 06/19/20 1721 .  insulin detemir (LEVEMIR) injection 10 Units, 10 Units, Subcutaneous, QHS, Hillary Bow, DO, 10 Units at 06/19/20 2219 .  levETIRAcetam  (KEPPRA) IVPB 1000 mg/100 mL premix, 1,000 mg, Intravenous, Q12H, Al Masry, Mahmoud, MD, Last Rate: 400 mL/hr at 06/20/20 0915, 1,000 mg at 06/20/20 0915 .  LORazepam (ATIVAN) injection 1-2 mg, 1-2 mg, Intravenous, Q4H PRN, Hillary Bow, DO, 2 mg at 06/16/20 1540 .  ticagrelor (BRILINTA) tablet 90 mg, 90 mg, Oral, BID, Marvel Plan, MD, 90 mg at 06/20/20 9798  Patients Current Diet:  Diet Order            Diet heart healthy/carb modified Room service appropriate? Yes; Fluid consistency: Thin  Diet effective now                 Precautions / Restrictions Precautions Precautions: Fall Precaution Comments: expressive and receptive aphasia Restrictions Weight Bearing Restrictions: No Other Position/Activity Restrictions: currently on continuous EEG   Has the patient had 2 or more falls or a fall with injury in the past year?Yes  Prior Activity Level Limited Community (1-2x/wk): out of house 1-2 days/week  Prior Functional Level Prior Function Level of Independence: Needs assistance Gait / Transfers Assistance Needed: pt was independent prior to april, mobilizing independently to bathroom and up stairs since last admission per spouse ADL's / Homemaking Assistance Needed: requiring supervision ADL/IADLs including cooking and bathing since last admission in May Communication / Swallowing Assistance Needed: seeing speech therapy for follow-up on aphasia since last admission in May 2022 Comments: Informantion taken from chart, patient is unable to describe, and no family in the room.  Self Care: Did the patient need help bathing, dressing, using the toilet or eating?  Independent  Indoor Mobility: Did the patient need assistance with walking from room to room (with or without device)? Independent  Stairs: Did the patient need assistance with internal or external stairs (with or without device)? Needed some help  Functional Cognition: Did the patient need help planning regular  tasks such as shopping or remembering to take medications? Needed some help  Home Assistive Devices / Equipment Home Equipment: None  Prior Device Use: Indicate devices/aids used by the patient prior to current illness, exacerbation or injury? None of the above  Current Functional Level Cognition  Arousal/Alertness: Awake/alert Overall Cognitive Status: Impaired/Different from baseline Current Attention Level: Focused Orientation Level: Oriented to person,Oriented to place Following Commands: Follows one step commands inconsistently,Follows one step commands with increased time Safety/Judgement: Decreased awareness of safety,Decreased awareness of deficits General Comments: pt previously being seen by OT for deficits in attention, R-sided inattention, and executive function deficits. presented today with significant tearfulness, very frustrated by situation and expressive difficulties. Pt stating "yeah" consistently through session, but seemingly with limited accuracy. Pt unable to follow simple commands such as point to the sign or point to the blue square, but was able to walk to the blue square and complete simple transfers.    Extremity Assessment (includes Sensation/Coordination)  Upper Extremity Assessment: Generalized weakness,RUE deficits/detail RUE Deficits / Details: difficulty with motor planning and transitioning from one fine motor activity to the next. LUE Deficits / Details: Patient is spontaneously moving both UE's, difficulty following MMT instructions.  Lower Extremity Assessment: Defer to PT evaluation RLE Deficits / Details: 4/5  gross RLE strength, ROM WFL, motor planning deficits noted with difficulty initiating and sequencing bed mobility with RLE. RLE Sensation: decreased light touch RLE Coordination: decreased gross motor (difficult to accurately discern with aphasia present) LLE Deficits / Details: 4+/5 strength grossly, ROM WFL LLE Sensation: WNL LLE  Coordination: decreased gross motor (difficult to accurately discern with aphasia present)    ADLs  General ADL Comments: ADL testing ongoing    Mobility  Overal bed mobility: Needs Assistance Bed Mobility: Rolling,Sidelying to Sit Rolling: Mod assist Sidelying to sit: Mod assist Supine to sit: Mod assist Sit to supine: Mod assist General bed mobility comments: modA to initiate movements, multimodal cueing pt with good strength to hold rail, complete movement once started    Transfers  Overall transfer level: Needs assistance Equipment used: 1 person hand held assist Transfers: Sit to/from Stand Sit to Stand: Min assist,Min guard General transfer comment: minA to power up with HHA and steady. pt initially with slight A-P sway. Able to progress to some sit-stand with minG    Ambulation / Gait / Stairs / Wheelchair Mobility  Ambulation/Gait Ambulation/Gait assistance: Editor, commissioning (Feet): 150 Feet Assistive device: 1 person hand held assist Gait Pattern/deviations: Step-through pattern,Decreased stride length,Narrow base of support General Gait Details: pt with slow but steady gait, able to maintain with no challenge, but needing directions and cues for all directions and wayfinding. pt walking in high-guard position with increased sway, suspect would have significant challenge with dynamic challenges Gait velocity: reduced Gait velocity interpretation: <1.8 ft/sec, indicate of risk for recurrent falls    Posture / Balance Balance Overall balance assessment: Needs assistance Sitting-balance support: Feet supported Sitting balance-Leahy Scale: Good Postural control: Posterior lean Standing balance support: Single extremity supported,During functional activity Standing balance-Leahy Scale: Poor Standing balance comment: single UE support for balance High level balance activites: Backward walking,Other (comment) High Level Balance Comments: moderate impariment with need  for physical assist, repeated multimodal cues, and modA to recover balance    Special needs/care consideration Diabetic management novoLOG: 0-9 units 3x daily with meals; Levemir: 10 units daily at bedtime, Bladder incontinence, External urinary catheter and Designated visitor [name], significant other; Dixon Boos, sister     Previous Home Environment (from acute therapy documentation) Living Arrangements: Spouse/significant other  Lives With: Significant other Available Help at Discharge: Other (Comment),Available PRN/intermittently (significant other works from home) Type of Home: Apartment Home Layout: Two level Alternate Level Stairs-Rails: Right,Left,Can reach both (lower part: rails on both sides, can reach at same time. upper part: rail is on right side) Alternate Level Stairs-Number of Steps: 13 Home Access: Stairs to enter Entrance Stairs-Rails: None Entrance Stairs-Number of Steps: 6-7 Bathroom Shower/Tub: Engineer, manufacturing systems: Standard Bathroom Accessibility: Yes How Accessible: Accessible via walker Home Care Services: No Additional Comments: wife at home recently, denied FMLA approval after recent medical events.  Discharge Living Setting Plans for Discharge Living Setting: [name] home Type of Home at Discharge: Apartment Discharge Home Layout: Two level Alternate Level Stairs-Rails: Right,Left,Can reach both (on lower part: rails on both sides, can reach at same time. Upper part: rail on right side) Alternate Level Stairs-Number of Steps: 13 Discharge Home Access: Stairs to enter Entrance Stairs-Rails: None Entrance Stairs-Number of Steps: 6-7 Discharge Bathroom Shower/Tub: Tub/shower unit Discharge Bathroom Toilet: Standard Discharge Bathroom Accessibility: Yes How Accessible: Accessible via walker Does the patient have any problems obtaining your medications?: No  Social/Family/Support Systems Anticipated Caregiver: [name],  girlfriend Anticipated Caregiver's Contact Information: [phone]  Caregiver Availability: Intermittent (she works from home) Discharge Plan Discussed with Primary Caregiver: Yes Is Caregiver In Agreement with Plan?: Yes Does Caregiver/Family have Issues with Lodging/Transportation while Pt is in Rehab?: No   Goals Patient/Family Goal for Rehab: Mod I-Supervision: PT/OT; Mod I: ST Expected length of stay: 10-13 days Pt/Family Agrees to Admission and willing to participate: Yes Program Orientation Provided & Reviewed with Pt/Caregiver Including Roles  & Responsibilities: Yes   Decrease burden of Care through IP rehab admission: NA    Possible need for SNF placement upon discharge: Not anticipated   Patient Condition: This patient's medical and functional status has changed since the consult dated: 06/18/2020 in which the Rehabilitation Physician determined and documented that the patient's condition is appropriate for intensive rehabilitative care in an inpatient rehabilitation facility. See "History of Present Illness" (above) for medical update. Functional changes are: Pt ambulating 150 ft at Min A level. [name] medical and functional status update has been discussed with the Rehabilitation physician and patient remains appropriate for inpatient rehabilitation. Will admit to inpatient rehab today.  Preadmission Screen Completed By:  Domingo PulseLauren P Graves Madden, CCC-SLP, 06/20/2020 4:35 PM with updates by Megan SalonLaura Lenyx Boody, MS CCC-SLP ______________________________________________________________________   Discussed status with Dr. Wynn BankerKirsteins at 1000 and received approval for admission today.  Admission Coordinator:  Domingo PulseLauren P Graves Madden, time [id number] Dorna Bloom/Date 06/23/20

## 2020-06-20 NOTE — Progress Notes (Signed)
PROGRESS NOTE    Nathaniel Spears  ZHY:[id number] DOB: [birth year] DOA: 06/15/2020 PCP: Grayce Sessions, NP     Brief Narrative:  Nathaniel Spears is a 45 year old male with past medical history significant for hypertension, diabetes, hyperlipidemia who presented to the hospital with chief complaint of aphasia.  Patient has been hospitalized for recurrent stroke since 04/20/2020.  Patient was admitted from 5/22 5/23 for acute strokes, transfer to [facility] for further neurologic work-up.  Work-up for CNS vasculitis was negative.  He now returns back to the emergency department with complaints of aphasia.  Neurology consulted.  Patient underwent LTM EEG as part of work-up.  New events last 24 hours / Subjective: Patient without any new complaints.  He is sitting in bed, eating breakfast.  Remains aphasic.  Understands that our next step is CIR placement.  Assessment & Plan:   Principal Problem:   Acute ischemic stroke [facility]) Active Problems:   Essential hypertension   Type 2 diabetes mellitus with hyperglycemia, with long-term current use of insulin (HCC)   Recurrent stroke -MRI brain showed several new or increased acute infarcts in both cerebral hemispheres -CTA head and neck: No emergent large vessel occlusion, unchanged multifocal severe intracranial stenosis -Neurology following -EEG showed lateralized rhythmic delta activity which is on the ictal-interictal continuum with low potential for seizures and is suggestive of cortical dysfunction in right hemisphere likely secondary to underlying stroke. Additionally, there is moderate diffuse encephalopathy, non specific etiology.No seizures or epileptiform discharges were seen throughout the recording. -Aspirin/plavix PTA, statin --> changed to aspirin/Brilinta -Keppra (started for seizure prophylaxis in May)  -Underwent continuous EEG during this hospital stay -PT OT SLP recommending CIR  Hypertension -Allow permissive hypertension in  setting of acute stroke  Diabetes mellitus type 2 -Continue Levemir, sliding scale insulin     DVT prophylaxis:  enoxaparin (LOVENOX) injection 40 mg Start: 06/16/20 1000  Code Status:     Code Status Orders  (From admission, onward)         Start     Ordered   06/15/20 2305  Full code  Continuous        06/15/20 2308        Code Status History    Date Active Date Inactive Code Status Order ID Comments User Context   06/04/2020 2222 06/07/2020 0623 Full Code [id number]  Marinda Elk, MD Inpatient   05/30/2020 2103 06/03/2020 1524 Full Code [id number]  Lewie Chamber, MD ED   04/20/2020 1922 04/23/2020 1909 Full Code [id number]  Charlsie Quest, MD ED   Advance Care Planning Activity     Family Communication: No family at bedside  Disposition Plan:  Status is: Inpatient  Remains inpatient appropriate because:Unsafe d/c plan   Dispo: The patient is from: Home              Anticipated d/c is to: CIR               Patient currently is medically stable to d/c. Awaiting final neuro recs, CIR placement.     Difficult to place patient No      Consultants:   Neurology  Procedures:   None   Antimicrobials:  Anti-infectives (From admission, onward)   None       Objective: Vitals:   06/19/20 1948 06/20/20 0011 06/20/20 0155 06/20/20 0353  BP: (!) 142/96 (!) 152/105 (!) 141/101 (!) 135/96  Pulse: 78 76 72 81  Resp: 16 18  18   Temp: 98 F (36.7 C) 97.7  F (36.5 C)  98.3 F (36.8 C)  TempSrc: Oral Oral  Oral  SpO2: 100% 100%  98%  Weight:      Height:        Intake/Output Summary (Last 24 hours) at 06/20/2020 1116 Last data filed at 06/20/2020 0408 Gross per 24 hour  Intake 460 ml  Output 300 ml  Net 160 ml   Filed Weights   06/15/20 2146  Weight: 115 kg    Examination: General exam: Appears calm and comfortable  Respiratory system: Clear to auscultation. Respiratory effort normal. Cardiovascular system: S1 & S2 heard, RRR. No pedal  edema. Gastrointestinal system: Abdomen is nondistended, soft and nontender. Normal bowel sounds heard. Central nervous system: Alert, + expressive aphasia Extremities: Symmetric in appearance bilaterally  Skin: No rashes, lesions or ulcers on exposed skin  Psychiatry:  Mood & affect appropriate.    Data Reviewed: I have personally reviewed following labs and imaging studies  CBC: Recent Labs  Lab 06/15/20 2052 06/15/20 2058  WBC 8.9  --   NEUTROABS 4.2  --   HGB 12.9* 13.6  HCT 40.1 40.0  MCV 80.4  --   PLT 334  --    Basic Metabolic Panel: Recent Labs  Lab 06/15/20 2052 06/15/20 2058 06/16/20 0735 06/17/20 0428 06/18/20 0130  NA 138 139 139 137 142  K 3.4* 3.4* 2.5* 3.8 3.6  CL 99 98 110 99 107  CO2 29  --  22 30 28   GLUCOSE 109* 108* 85 88 97  BUN 18 20 11 10 11   CREATININE 1.43* 1.50* 0.88 1.16 1.12  CALCIUM 9.1  --  6.8* 8.8* 8.9  MG  --   --  1.4* 1.8  --    GFR: Estimated Creatinine Clearance: 109.1 mL/min (by C-G formula based on SCr of 1.12 mg/dL). Liver Function Tests: Recent Labs  Lab 06/15/20 2052  AST 12*  ALT 15  ALKPHOS 57  BILITOT 0.8  PROT 6.7  ALBUMIN 3.5   No results for input(s): LIPASE, AMYLASE in the last 168 hours. No results for input(s): AMMONIA in the last 168 hours. Coagulation Profile: Recent Labs  Lab 06/15/20 2052  INR 1.1   Cardiac Enzymes: No results for input(s): CKTOTAL, CKMB, CKMBINDEX, TROPONINI in the last 168 hours. BNP (last 3 results) No results for input(s): PROBNP in the last 8760 hours. HbA1C: No results for input(s): HGBA1C in the last 72 hours. CBG: Recent Labs  Lab 06/19/20 1542 06/19/20 1945 06/19/20 2110 06/20/20 0311 06/20/20 0618  GLUCAP 144* 120* 104* 103* 103*   Lipid Profile: No results for input(s): CHOL, HDL, LDLCALC, TRIG, CHOLHDL, LDLDIRECT in the last 72 hours. Thyroid Function Tests: No results for input(s): TSH, T4TOTAL, FREET4, T3FREE, THYROIDAB in the last 72 hours. Anemia  Panel: No results for input(s): VITAMINB12, FOLATE, FERRITIN, TIBC, IRON, RETICCTPCT in the last 72 hours. Sepsis Labs: No results for input(s): PROCALCITON, LATICACIDVEN in the last 168 hours.  Recent Results (from the past 240 hour(s))  Resp Panel by RT-PCR (Flu A&B, Covid) Nasopharyngeal Swab     Status: None   Collection Time: 06/15/20 10:26 PM   Specimen: Nasopharyngeal Swab; Nasopharyngeal(NP) swabs in vial transport medium  Result Value Ref Range Status   SARS Coronavirus 2 by RT PCR NEGATIVE NEGATIVE Final    Comment: (NOTE) SARS-CoV-2 target nucleic acids are NOT DETECTED.  The SARS-CoV-2 RNA is generally detectable in upper respiratory specimens during the acute phase of infection. The lowest concentration of SARS-CoV-2 viral copies  this assay can detect is 138 copies/mL. A negative result does not preclude SARS-Cov-2 infection and should not be used as the sole basis for treatment or other patient management decisions. A negative result may occur with  improper specimen collection/handling, submission of specimen other than nasopharyngeal swab, presence of viral mutation(s) within the areas targeted by this assay, and inadequate number of viral copies(<138 copies/mL). A negative result must be combined with clinical observations, patient history, and epidemiological information. The expected result is Negative.  Fact Sheet for Patients:  BloggerCourse.com  Fact Sheet for Healthcare Providers:  SeriousBroker.it  This test is no t yet approved or cleared by the [place] FDA and  has been authorized for detection and/or diagnosis of SARS-CoV-2 by FDA under an Emergency Use Authorization (EUA). This EUA will remain  in effect (meaning this test can be used) for the duration of the COVID-19 declaration under Section 564(b)(1) of the Act, 21 U.S.C.section 360bbb-3(b)(1), unless the authorization is terminated  or  revoked sooner.       Influenza A by PCR NEGATIVE NEGATIVE Final   Influenza B by PCR NEGATIVE NEGATIVE Final    Comment: (NOTE) The Xpert Xpress SARS-CoV-2/FLU/RSV plus assay is intended as an aid in the diagnosis of influenza from Nasopharyngeal swab specimens and should not be used as a sole basis for treatment. Nasal washings and aspirates are unacceptable for Xpert Xpress SARS-CoV-2/FLU/RSV testing.  Fact Sheet for Patients: BloggerCourse.com  Fact Sheet for Healthcare Providers: SeriousBroker.it  This test is not yet approved or cleared by the [place] FDA and has been authorized for detection and/or diagnosis of SARS-CoV-2 by FDA under an Emergency Use Authorization (EUA). This EUA will remain in effect (meaning this test can be used) for the duration of the COVID-19 declaration under Section 564(b)(1) of the Act, 21 U.S.C. section 360bbb-3(b)(1), unless the authorization is terminated or revoked.  Performed at [facility], [address], [place], [place] [address]       Radiology Studies: No results found.    Scheduled Meds: . aspirin EC  81 mg Oral Daily  . atorvastatin  80 mg Oral Daily  . enoxaparin (LOVENOX) injection  40 mg Subcutaneous Q24H  . insulin aspart  0-9 Units Subcutaneous TID WC  . insulin detemir  10 Units Subcutaneous QHS  . ticagrelor  90 mg Oral BID   Continuous Infusions: . levETIRAcetam 1,000 mg (06/20/20 0915)     LOS: 5 days      Time spent: 20 minutes   Noralee Stain, DO [facility] 06/20/2020, 11:16 AM   Available via Epic secure chat 7am-7pm After these hours, please refer to coverage provider listed on [email or web address]

## 2020-06-20 NOTE — Progress Notes (Signed)
Inpatient Rehab Admissions Coordinator:  Able to talk to pt's girlfriend, [name] on the telephone. Explained CIR goals and expectations to her. She acknowledged understanding of CIR goals and expectations. She is supportive of pt pursuing CIR.  Will continue to follow.    Wolfgang Phoenix, MS, CCC-SLP Admissions Coordinator [phone]

## 2020-06-21 LAB — GLUCOSE, CAPILLARY
Glucose-Capillary: 90 mg/dL (ref 70–99)
Glucose-Capillary: 115 mg/dL — ABNORMAL HIGH (ref 70–99)
Glucose-Capillary: 99 mg/dL (ref 70–99)
Glucose-Capillary: 90 mg/dL (ref 70–99)

## 2020-06-21 MED ORDER — HYDRALAZINE HCL 20 MG/ML IJ SOLN
10.0000 mg | Freq: Four times a day (QID) | INTRAMUSCULAR | Status: DC | PRN
  Administered 2020-06-21: 10 mg via INTRAVENOUS
  Filled 2020-06-21: qty 1

## 2020-06-21 MED ORDER — AMLODIPINE BESYLATE 5 MG PO TABS
5.0000 mg | ORAL_TABLET | Freq: Every day | ORAL | Status: DC
  Administered 2020-06-21 – 2020-06-23 (×3): 5 mg via ORAL
  Filled 2020-06-21 (×3): qty 1

## 2020-06-21 MED ORDER — LEVETIRACETAM 500 MG PO TABS
1000.0000 mg | ORAL_TABLET | Freq: Two times a day (BID) | ORAL | Status: DC
  Administered 2020-06-21 – 2020-06-23 (×4): 1000 mg via ORAL
  Filled 2020-06-21 (×4): qty 2

## 2020-06-21 MED ORDER — HYDROCHLOROTHIAZIDE 12.5 MG PO CAPS
12.5000 mg | ORAL_CAPSULE | Freq: Every day | ORAL | Status: DC
  Administered 2020-06-21 – 2020-06-23 (×3): 12.5 mg via ORAL
  Filled 2020-06-21 (×3): qty 1

## 2020-06-21 NOTE — Plan of Care (Signed)
  Problem: Education: Goal: Knowledge of disease or condition will improve Outcome: Progressing Goal: Knowledge of secondary prevention will improve Outcome: Progressing Goal: Knowledge of patient specific risk factors addressed and post discharge goals established will improve Outcome: Progressing Goal: Individualized Educational Video(s) Outcome: Progressing   Problem: Coping: Goal: Will verbalize positive feelings about self Outcome: Progressing Goal: Will identify appropriate support needs Outcome: Progressing   Problem: Health Behavior/Discharge Planning: Goal: Ability to manage health-related needs will improve Outcome: Progressing   Problem: Intracerebral Hemorrhage Tissue Perfusion: Goal: Complications of Intracerebral Hemorrhage will be minimized Outcome: Progressing   Problem: Ischemic Stroke/TIA Tissue Perfusion: Goal: Complications of ischemic stroke/TIA will be minimized Outcome: Progressing   Problem: Spontaneous Subarachnoid Hemorrhage Tissue Perfusion: Goal: Complications of Spontaneous Subarachnoid Hemorrhage will be minimized Outcome: Progressing

## 2020-06-21 NOTE — Progress Notes (Signed)
Pt w/ some high BP's this shift. Pt noted to have a running history of HTN prior to admission. Pt asymptomatic, but Dr. Leafy Half informed in hopes of a prn anti-hypertensive if not contraindicated.

## 2020-06-21 NOTE — Progress Notes (Signed)
PROGRESS NOTE    Nathaniel Spears  BMW:[id number] DOB: [birth year] DOA: 06/15/2020 PCP: Grayce Sessions, NP     Brief Narrative:  Nathaniel Spears is a 45 year old male with past medical history significant for hypertension, diabetes, hyperlipidemia who presented to the hospital with chief complaint of aphasia.  Patient has been hospitalized for recurrent stroke since 04/20/2020.  Patient was admitted from 5/22 5/23 for acute strokes, transfer to [facility] for further neurologic work-up.  Work-up for CNS vasculitis was negative.  He now returns back to the emergency department with complaints of aphasia.  Neurology consulted.  Patient underwent LTM EEG as part of work-up.  New events last 24 hours / Subjective: Patient sitting in bed, eating breakfast.  Remains aphasic.  He has no new physical complaints.  Awaiting CIR placement.  Assessment & Plan:   Principal Problem:   Acute ischemic stroke [facility]) Active Problems:   Essential hypertension   Type 2 diabetes mellitus with hyperglycemia, with long-term current use of insulin (HCC)   Recurrent stroke -MRI brain showed several new or increased acute infarcts in both cerebral hemispheres -CTA head and neck: No emergent large vessel occlusion, unchanged multifocal severe intracranial stenosis -Neurology following -EEG showed lateralized rhythmic delta activity which is on the ictal-interictal continuum with low potential for seizures and is suggestive of cortical dysfunction in right hemisphere likely secondary to underlying stroke. Additionally, there is moderate diffuse encephalopathy, non specific etiology.No seizures or epileptiform discharges were seen throughout the recording. -Aspirin/plavix PTA, statin --> changed to aspirin/Brilinta for 30 days, then aspirin/Plavix for another 2 months, then Plavix alone -Keppra (started for seizure prophylaxis in May) 1 g twice daily -Underwent continuous EEG during this hospital stay -PT OT SLP  recommending CIR -Outpatient neurology follow-up  Hypertension -Resume hydrochlorothiazide, Norvasc  Diabetes mellitus type 2 -Continue Levemir, sliding scale insulin     DVT prophylaxis:  enoxaparin (LOVENOX) injection 40 mg Start: 06/16/20 1000  Code Status:     Code Status Orders  (From admission, onward)         Start     Ordered   06/15/20 2305  Full code  Continuous        06/15/20 2308        Code Status History    Date Active Date Inactive Code Status Order ID Comments User Context   06/04/2020 2222 06/07/2020 0623 Full Code [id number]  Marinda Elk, MD Inpatient   05/30/2020 2103 06/03/2020 1524 Full Code [id number]  Lewie Chamber, MD ED   04/20/2020 1922 04/23/2020 1909 Full Code [id number]  Charlsie Quest, MD ED   Advance Care Planning Activity     Family Communication: No family at bedside  Disposition Plan:  Status is: Inpatient  Remains inpatient appropriate because:Unsafe d/c plan   Dispo: The patient is from: Home              Anticipated d/c is to: CIR               Patient currently is medically stable to d/c.  CIR placement pending   Difficult to place patient No      Consultants:   Neurology  Procedures:   None   Antimicrobials:  Anti-infectives (From admission, onward)   None       Objective: Vitals:   06/21/20 0025 06/21/20 0350 06/21/20 0510 06/21/20 0745  BP: (!) 156/111 (!) 164/114 (!) 154/105 (!) 153/115  Pulse: 71 80 94 87  Resp: 16 17  18   Temp: 98.1  F (36.7 C) 97.6 F (36.4 C)  98.1 F (36.7 C)  TempSrc:  Oral  Oral  SpO2: 100% 100%  99%  Weight:      Height:        Intake/Output Summary (Last 24 hours) at 06/21/2020 1129 Last data filed at 06/21/2020 2482 Gross per 24 hour  Intake --  Output 200 ml  Net -200 ml   Filed Weights   06/15/20 2146  Weight: 115 kg    Examination: General exam: Appears calm and comfortable  Respiratory system: Clear to auscultation. Respiratory effort  normal. Cardiovascular system: S1 & S2 heard, RRR. No pedal edema. Gastrointestinal system: Abdomen is nondistended, soft and nontender. Normal bowel sounds heard. Central nervous system: Alert and oriented. + Expressive aphasia Extremities: Symmetric in appearance bilaterally  Skin: No rashes, lesions or ulcers on exposed skin  Psychiatry: Judgement and insight appear stable. Mood & affect appropriate.     Data Reviewed: I have personally reviewed following labs and imaging studies  CBC: Recent Labs  Lab 06/15/20 2052 06/15/20 2058  WBC 8.9  --   NEUTROABS 4.2  --   HGB 12.9* 13.6  HCT 40.1 40.0  MCV 80.4  --   PLT 334  --    Basic Metabolic Panel: Recent Labs  Lab 06/15/20 2052 06/15/20 2058 06/16/20 0735 06/17/20 0428 06/18/20 0130  NA 138 139 139 137 142  K 3.4* 3.4* 2.5* 3.8 3.6  CL 99 98 110 99 107  CO2 29  --  22 30 28   GLUCOSE 109* 108* 85 88 97  BUN 18 20 11 10 11   CREATININE 1.43* 1.50* 0.88 1.16 1.12  CALCIUM 9.1  --  6.8* 8.8* 8.9  MG  --   --  1.4* 1.8  --    GFR: Estimated Creatinine Clearance: 109.1 mL/min (by C-G formula based on SCr of 1.12 mg/dL). Liver Function Tests: Recent Labs  Lab 06/15/20 2052  AST 12*  ALT 15  ALKPHOS 57  BILITOT 0.8  PROT 6.7  ALBUMIN 3.5   No results for input(s): LIPASE, AMYLASE in the last 168 hours. No results for input(s): AMMONIA in the last 168 hours. Coagulation Profile: Recent Labs  Lab 06/15/20 2052  INR 1.1   Cardiac Enzymes: No results for input(s): CKTOTAL, CKMB, CKMBINDEX, TROPONINI in the last 168 hours. BNP (last 3 results) No results for input(s): PROBNP in the last 8760 hours. HbA1C: No results for input(s): HGBA1C in the last 72 hours. CBG: Recent Labs  Lab 06/20/20 0618 06/20/20 1244 06/20/20 1658 06/20/20 2119 06/21/20 0615  GLUCAP 103* 114* 100* 92 90   Lipid Profile: No results for input(s): CHOL, HDL, LDLCALC, TRIG, CHOLHDL, LDLDIRECT in the last 72 hours. Thyroid  Function Tests: No results for input(s): TSH, T4TOTAL, FREET4, T3FREE, THYROIDAB in the last 72 hours. Anemia Panel: No results for input(s): VITAMINB12, FOLATE, FERRITIN, TIBC, IRON, RETICCTPCT in the last 72 hours. Sepsis Labs: No results for input(s): PROCALCITON, LATICACIDVEN in the last 168 hours.  Recent Results (from the past 240 hour(s))  Resp Panel by RT-PCR (Flu A&B, Covid) Nasopharyngeal Swab     Status: None   Collection Time: 06/15/20 10:26 PM   Specimen: Nasopharyngeal Swab; Nasopharyngeal(NP) swabs in vial transport medium  Result Value Ref Range Status   SARS Coronavirus 2 by RT PCR NEGATIVE NEGATIVE Final    Comment: (NOTE) SARS-CoV-2 target nucleic acids are NOT DETECTED.  The SARS-CoV-2 RNA is generally detectable in upper respiratory specimens during the acute  phase of infection. The lowest concentration of SARS-CoV-2 viral copies this assay can detect is 138 copies/mL. A negative result does not preclude SARS-Cov-2 infection and should not be used as the sole basis for treatment or other patient management decisions. A negative result may occur with  improper specimen collection/handling, submission of specimen other than nasopharyngeal swab, presence of viral mutation(s) within the areas targeted by this assay, and inadequate number of viral copies(<138 copies/mL). A negative result must be combined with clinical observations, patient history, and epidemiological information. The expected result is Negative.  Fact Sheet for Patients:  BloggerCourse.com  Fact Sheet for Healthcare Providers:  SeriousBroker.it  This test is no t yet approved or cleared by the [place] FDA and  has been authorized for detection and/or diagnosis of SARS-CoV-2 by FDA under an Emergency Use Authorization (EUA). This EUA will remain  in effect (meaning this test can be used) for the duration of the COVID-19 declaration under  Section 564(b)(1) of the Act, 21 U.S.C.section 360bbb-3(b)(1), unless the authorization is terminated  or revoked sooner.       Influenza A by PCR NEGATIVE NEGATIVE Final   Influenza B by PCR NEGATIVE NEGATIVE Final    Comment: (NOTE) The Xpert Xpress SARS-CoV-2/FLU/RSV plus assay is intended as an aid in the diagnosis of influenza from Nasopharyngeal swab specimens and should not be used as a sole basis for treatment. Nasal washings and aspirates are unacceptable for Xpert Xpress SARS-CoV-2/FLU/RSV testing.  Fact Sheet for Patients: BloggerCourse.com  Fact Sheet for Healthcare Providers: SeriousBroker.it  This test is not yet approved or cleared by the [place] FDA and has been authorized for detection and/or diagnosis of SARS-CoV-2 by FDA under an Emergency Use Authorization (EUA). This EUA will remain in effect (meaning this test can be used) for the duration of the COVID-19 declaration under Section 564(b)(1) of the Act, 21 U.S.C. section 360bbb-3(b)(1), unless the authorization is terminated or revoked.  Performed at [facility], [address], [place], [place] [address]       Radiology Studies: No results found.    Scheduled Meds: . amLODipine  5 mg Oral Daily  . aspirin EC  81 mg Oral Daily  . atorvastatin  80 mg Oral Daily  . enoxaparin (LOVENOX) injection  40 mg Subcutaneous Q24H  . FLUoxetine  20 mg Oral Daily  . hydrochlorothiazide  12.5 mg Oral Daily  . insulin aspart  0-9 Units Subcutaneous TID WC  . insulin detemir  10 Units Subcutaneous QHS  . ticagrelor  90 mg Oral BID   Continuous Infusions: . levETIRAcetam 1,000 mg (06/21/20 0846)     LOS: 6 days      Time spent: 20 minutes   Noralee Stain, DO [facility] 06/21/2020, 11:29 AM   Available via Epic secure chat 7am-7pm After these hours, please refer to coverage provider listed on [email or web address]

## 2020-06-21 NOTE — Progress Notes (Signed)
Inpatient Rehab Admissions Coordinator:  There are no beds available in CIR for pt today. Will continue to follow.   Destan Franchini Graves Madden, MS, CCC-SLP Admissions Coordinator [phone]  

## 2020-06-21 NOTE — Progress Notes (Signed)
Physical Therapy Treatment Patient Details Name: Nathaniel Spears MRN: [id number] DOB: 06-Apr-1975 Today's Date: 06/21/2020    History of Present Illness Nathaniel Spears is a 45 yo male admitted on 06/15/2020 with c/o aphasia,  with multiple recent admits for repeated strokes. Pt most recently admitted 5/20-5/23 for acute strokes before being transferred at family request to [facility] for further work-up. While in the ED, pt had worsening aphasia compared to baseline. MRI reveals several new or increased acute infarcts are  demonstrated in both cerebral hemispheres (today the bilateral MCA  and left ACA territories) since the recent MRI 06/04/2020. PMH includes HTN, DM2, HLD, multiple recent admits for repeated strokes.    PT Comments    Pt received in bed, soiled with urine and BM but unaware. Pt with increased BP this AM. RN reports BP meds given. BP 137/96 supine in bed, prior to initiation of mobility. Pt required min assist bed mobility, min assist transfers, and min assist in room ambulation. Pt assisted with pericare and changing gown. Pt in recliner with feet elevated at end of session.    Follow Up Recommendations  CIR     Equipment Recommendations  None recommended by PT    Recommendations for Other Services Rehab consult     Precautions / Restrictions Precautions Precautions: Fall;Other (comment) Precaution Comments: expressive and receptive aphasia Restrictions Weight Bearing Restrictions: No    Mobility  Bed Mobility Overal bed mobility: Needs Assistance Bed Mobility: Supine to Sit     Supine to sit: Min assist;HOB elevated     General bed mobility comments: cues for sequencing, increased time    Transfers Overall transfer level: Needs assistance Equipment used: 1 person hand held assist Transfers: Sit to/from Nathaniel Spears Sit to Stand: Min assist Stand pivot transfers: Min assist       General transfer comment: assist to power up and stabilize  balance  Ambulation/Gait Ambulation/Gait assistance: Min assist Gait Distance (Feet): 20 Feet (x 2) Assistive device: 1 person hand held assist Gait Pattern/deviations: Step-through pattern;Decreased stride length;Wide base of support Gait velocity: decreased Gait velocity interpretation: <1.8 ft/sec, indicate of risk for recurrent falls General Gait Details: Gait limited to in room due to pt soiled with BM and unaware. Assisted to/from bathroom for further BM in toilet and washing up.   Stairs             Wheelchair Mobility    Modified Rankin (Stroke Patients Only) Modified Rankin (Stroke Patients Only) Pre-Morbid Rankin Score: Moderate disability Modified Rankin: Moderately severe disability     Balance Overall balance assessment: Needs assistance Sitting-balance support: Feet supported;No upper extremity supported Sitting balance-Leahy Scale: Good     Standing balance support: No upper extremity supported;Single extremity supported;During functional activity Standing balance-Leahy Scale: Poor Standing balance comment: reliant on external support                            Cognition Arousal/Alertness: Awake/alert Behavior During Therapy: Flat affect Overall Cognitive Status: Impaired/Different from baseline Area of Impairment: Following commands;Safety/judgement;Awareness;Problem solving;Attention                   Current Attention Level: Sustained Memory: Decreased recall of precautions Following Commands: Follows one step commands inconsistently;Follows one step commands with increased time Safety/Judgement: Decreased awareness of safety;Decreased awareness of deficits Awareness: Intellectual Problem Solving: Difficulty sequencing;Requires verbal cues;Requires tactile cues;Slow processing General Comments: Pt demonstrates frustration with expressive difficulties. Easily distracted. Cues to stay  on task.      Exercises      General  Comments General comments (skin integrity, edema, etc.): BP 137/96 prior to initiation of mobility.      Pertinent Vitals/Pain Pain Assessment: Faces Faces Pain Scale: No hurt    Home Living                      Prior Function            PT Goals (current goals can now be found in the care plan section) Acute Rehab PT Goals Patient Stated Goal: not stated Progress towards PT goals: Progressing toward goals    Frequency    Min 4X/week      PT Plan Current plan remains appropriate    Co-evaluation              AM-PAC PT "6 Clicks" Mobility   Outcome Measure  Help needed turning from your back to your side while in a flat bed without using bedrails?: A Little Help needed moving from lying on your back to sitting on the side of a flat bed without using bedrails?: A Little Help needed moving to and from a bed to a chair (including a wheelchair)?: A Little Help needed standing up from a chair using your arms (e.g., wheelchair or bedside chair)?: A Little Help needed to walk in hospital room?: A Little Help needed climbing 3-5 steps with a railing? : A Lot 6 Click Score: 17    End of Session Equipment Utilized During Treatment: Gait belt Activity Tolerance: Patient tolerated treatment well Patient left: in bed;with call bell/phone within reach;with chair alarm set Nurse Communication: Mobility status PT Visit Diagnosis: Unsteadiness on feet (R26.81);Other abnormalities of gait and mobility (R26.89);Muscle weakness (generalized) (M62.81);Difficulty in walking, not elsewhere classified (R26.2);Apraxia (R48.2)     Time: 0998-3382 PT Time Calculation (min) (ACUTE ONLY): 24 min  Charges:  $Gait Training: 8-22 mins $Therapeutic Activity: 8-22 mins                     Aida Raider, PT  Office # [phone] Pager [phone]    Ilda Foil 06/21/2020, 12:23 PM

## 2020-06-22 ENCOUNTER — Encounter: Payer: Self-pay | Admitting: Occupational Therapy

## 2020-06-22 LAB — GLUCOSE, CAPILLARY
Glucose-Capillary: 112 mg/dL — ABNORMAL HIGH (ref 70–99)
Glucose-Capillary: 118 mg/dL — ABNORMAL HIGH (ref 70–99)
Glucose-Capillary: 133 mg/dL — ABNORMAL HIGH (ref 70–99)
Glucose-Capillary: 180 mg/dL — ABNORMAL HIGH (ref 70–99)

## 2020-06-22 NOTE — Progress Notes (Signed)
  Speech Language Pathology Treatment: Cognitive-Linquistic  Patient Details Name: Nathaniel Spears MRN: [id number] DOB: [birth year] Today's Date: 06/22/2020 Time: 6010-9323 SLP Time Calculation (min) (ACUTE ONLY): 20 min  Assessment / Plan / Recommendation Clinical Impression  Pt seen for skilled aphasia treatment with significant other at bedside. Given confrontational naming task, pt identified 50% of presented  objects in room without cues and 50% provided mod sentence completion and phonemic cues. Structured sentence completion task completed with 60% accuracy with phonemic and orthographic cues most effective. When asked open ended questions, he benefited from written choices in list of 2-3 to express preferences. Educated pt and significant other in regards to helpful cues and communication strategies utilized this session and encouraged use of multiple modalities (visual, written, choices, gestural, etc.) in order to improve communication of wants/needs. SLP to continue to follow acutely.     HPI HPI: Nathaniel Spears is a 45 y.o. male with history of recent recurrent cryptogenic ischemic strokes 4/5-4/8, 5/15-19, repeat ER visit 5/19, 5/20-5/31 here and transferred to [facility],   multifocal severe intracranial stenoses, negative work up for vasculitis last week,  uncontrolled DM2, HLD, HTN, obesity and previous cocaine use. He was discharged and only home for a day when EMS was activated for the current episode of acute aphasia (alert but no verbal response at all) x 30 minutes. MRI once again several new or increased acute infarcts are  demonstrated in both cerebral hemispheres (today the bilateral MCA and left ACA territories) since the recent MRI 06/04/2020.      SLP Plan  Continue with current plan of care       Recommendations                   Follow up Recommendations: Inpatient Rehab SLP Visit Diagnosis: Aphasia (R47.01) Plan: Continue with current plan of care        GO              Avie Echevaria, MA, CCC-SLP Acute Rehabilitation Services Office Number: [phone]   Paulette Blanch 06/22/2020, 3:40 PM

## 2020-06-22 NOTE — Progress Notes (Addendum)
PROGRESS NOTE    Nathaniel Spears  KVQ:[id number] DOB: 02/14/1975 DOA: 06/15/2020 PCP: Grayce Sessions, NP     Brief Narrative:  Nathaniel Spears is a 45 year old male with past medical history significant for hypertension, diabetes, hyperlipidemia who presented to the hospital with chief complaint of aphasia.  Patient has been hospitalized for recurrent stroke since 04/20/2020.  Patient was admitted from 5/22 5/23 for acute strokes, transfer to [facility] for further neurologic work-up.  Work-up for CNS vasculitis was negative.  He now returns back to the emergency department with complaints of aphasia.  Neurology consulted.  Patient underwent LTM EEG as part of work-up.  New events last 24 hours / Subjective: Patient without any new complaints or concerns today.  Awaiting CIR placement.  Remains medically stable  Assessment & Plan:   Principal Problem:   Acute ischemic stroke [facility]) Active Problems:   Essential hypertension   Type 2 diabetes mellitus with hyperglycemia, with long-term current use of insulin (HCC)   Recurrent stroke -MRI brain showed several new or increased acute infarcts in both cerebral hemispheres -CTA head and neck: No emergent large vessel occlusion, unchanged multifocal severe intracranial stenosis -Neurology following -EEG showed lateralized rhythmic delta activity which is on the ictal-interictal continuum with low potential for seizures and is suggestive of cortical dysfunction in right hemisphere likely secondary to underlying stroke. Additionally, there is moderate diffuse encephalopathy, non specific etiology.No seizures or epileptiform discharges were seen throughout the recording. -Aspirin/plavix PTA, statin --> changed to aspirin/Brilinta for 30 days, then aspirin/Plavix for another 2 months, then Plavix alone -Keppra (started for seizure prophylaxis in May) 1 g twice daily -Underwent continuous EEG during this hospital stay -PT OT SLP recommending  CIR -Outpatient neurology follow-up  Hypertension -Continue hydrochlorothiazide, Norvasc  Diabetes mellitus type 2 -Continue Levemir, sliding scale insulin     DVT prophylaxis:  enoxaparin (LOVENOX) injection 40 mg Start: 06/16/20 1000  Code Status:     Code Status Orders  (From admission, onward)         Start     Ordered   06/15/20 2305  Full code  Continuous        06/15/20 2308        Code Status History    Date Active Date Inactive Code Status Order ID Comments User Context   06/04/2020 2222 06/07/2020 0623 Full Code [id number]  Marinda Elk, MD Inpatient   05/30/2020 2103 06/03/2020 1524 Full Code [id number]  Lewie Chamber, MD ED   04/20/2020 1922 04/23/2020 1909 Full Code [id number]  Charlsie Quest, MD ED   Advance Care Planning Activity     Family Communication: No family at bedside; spoke with fiance over the phone  Disposition Plan:  Status is: Inpatient  Remains inpatient appropriate because:Unsafe d/c plan   Dispo: The patient is from: Home              Anticipated d/c is to: CIR               Patient currently is medically stable to d/c.  CIR placement pending   Difficult to place patient No      Consultants:   Neurology  Procedures:   None   Antimicrobials:  Anti-infectives (From admission, onward)   None       Objective: Vitals:   06/21/20 2332 06/22/20 0407 06/22/20 0753 06/22/20 1123  BP: 103/67 (!) 129/100 (!) 126/93 (!) 134/106  Pulse: 91 97 82 91  Resp: 18 18    Temp:  97.6 F (36.4 C) 97.6 F (36.4 C) 98.6 F (37 C) 98 F (36.7 C)  TempSrc: Oral Oral Oral Oral  SpO2: 92% 98% 100% 100%  Weight:      Height:        Intake/Output Summary (Last 24 hours) at 06/22/2020 1217 Last data filed at 06/21/2020 1400 Gross per 24 hour  Intake 320 ml  Output --  Net 320 ml   Filed Weights   06/15/20 2146  Weight: 115 kg    Examination: General exam: Appears calm and comfortable  Respiratory system: Clear to auscultation.  Respiratory effort normal. Cardiovascular system: S1 & S2 heard, RRR. No pedal edema. Gastrointestinal system: Abdomen is nondistended, soft and nontender. Normal bowel sounds heard. Central nervous system: Alert and oriented. + Expressive aphasia Extremities: Symmetric in appearance bilaterally  Skin: No rashes, lesions or ulcers on exposed skin  Psychiatry: Judgement and insight appear stable. Mood & affect appropriate.     Data Reviewed: I have personally reviewed following labs and imaging studies  CBC: Recent Labs  Lab 06/15/20 2052 06/15/20 2058  WBC 8.9  --   NEUTROABS 4.2  --   HGB 12.9* 13.6  HCT 40.1 40.0  MCV 80.4  --   PLT 334  --    Basic Metabolic Panel: Recent Labs  Lab 06/15/20 2052 06/15/20 2058 06/16/20 0735 06/17/20 0428 06/18/20 0130  NA 138 139 139 137 142  K 3.4* 3.4* 2.5* 3.8 3.6  CL 99 98 110 99 107  CO2 29  --  22 30 28   GLUCOSE 109* 108* 85 88 97  BUN 18 20 11 10 11   CREATININE 1.43* 1.50* 0.88 1.16 1.12  CALCIUM 9.1  --  6.8* 8.8* 8.9  MG  --   --  1.4* 1.8  --    GFR: Estimated Creatinine Clearance: 109.1 mL/min (by C-G formula based on SCr of 1.12 mg/dL). Liver Function Tests: Recent Labs  Lab 06/15/20 2052  AST 12*  ALT 15  ALKPHOS 57  BILITOT 0.8  PROT 6.7  ALBUMIN 3.5   No results for input(s): LIPASE, AMYLASE in the last 168 hours. No results for input(s): AMMONIA in the last 168 hours. Coagulation Profile: Recent Labs  Lab 06/15/20 2052  INR 1.1   Cardiac Enzymes: No results for input(s): CKTOTAL, CKMB, CKMBINDEX, TROPONINI in the last 168 hours. BNP (last 3 results) No results for input(s): PROBNP in the last 8760 hours. HbA1C: No results for input(s): HGBA1C in the last 72 hours. CBG: Recent Labs  Lab 06/21/20 1137 06/21/20 1656 06/21/20 2139 06/22/20 0630 06/22/20 1124  GLUCAP 115* 99 90 112* 118*   Lipid Profile: No results for input(s): CHOL, HDL, LDLCALC, TRIG, CHOLHDL, LDLDIRECT in the last 72  hours. Thyroid Function Tests: No results for input(s): TSH, T4TOTAL, FREET4, T3FREE, THYROIDAB in the last 72 hours. Anemia Panel: No results for input(s): VITAMINB12, FOLATE, FERRITIN, TIBC, IRON, RETICCTPCT in the last 72 hours. Sepsis Labs: No results for input(s): PROCALCITON, LATICACIDVEN in the last 168 hours.  Recent Results (from the past 240 hour(s))  Resp Panel by RT-PCR (Flu A&B, Covid) Nasopharyngeal Swab     Status: None   Collection Time: 06/15/20 10:26 PM   Specimen: Nasopharyngeal Swab; Nasopharyngeal(NP) swabs in vial transport medium  Result Value Ref Range Status   SARS Coronavirus 2 by RT PCR NEGATIVE NEGATIVE Final    Comment: (NOTE) SARS-CoV-2 target nucleic acids are NOT DETECTED.  The SARS-CoV-2 RNA is generally detectable in upper respiratory  specimens during the acute phase of infection. The lowest concentration of SARS-CoV-2 viral copies this assay can detect is 138 copies/mL. A negative result does not preclude SARS-Cov-2 infection and should not be used as the sole basis for treatment or other patient management decisions. A negative result may occur with  improper specimen collection/handling, submission of specimen other than nasopharyngeal swab, presence of viral mutation(s) within the areas targeted by this assay, and inadequate number of viral copies(<138 copies/mL). A negative result must be combined with clinical observations, patient history, and epidemiological information. The expected result is Negative.  Fact Sheet for Patients:  BloggerCourse.com  Fact Sheet for Healthcare Providers:  SeriousBroker.it  This test is no t yet approved or cleared by the [place] FDA and  has been authorized for detection and/or diagnosis of SARS-CoV-2 by FDA under an Emergency Use Authorization (EUA). This EUA will remain  in effect (meaning this test can be used) for the duration of the COVID-19  declaration under Section 564(b)(1) of the Act, 21 U.S.C.section 360bbb-3(b)(1), unless the authorization is terminated  or revoked sooner.       Influenza A by PCR NEGATIVE NEGATIVE Final   Influenza B by PCR NEGATIVE NEGATIVE Final    Comment: (NOTE) The Xpert Xpress SARS-CoV-2/FLU/RSV plus assay is intended as an aid in the diagnosis of influenza from Nasopharyngeal swab specimens and should not be used as a sole basis for treatment. Nasal washings and aspirates are unacceptable for Xpert Xpress SARS-CoV-2/FLU/RSV testing.  Fact Sheet for Patients: BloggerCourse.com  Fact Sheet for Healthcare Providers: SeriousBroker.it  This test is not yet approved or cleared by the [place] FDA and has been authorized for detection and/or diagnosis of SARS-CoV-2 by FDA under an Emergency Use Authorization (EUA). This EUA will remain in effect (meaning this test can be used) for the duration of the COVID-19 declaration under Section 564(b)(1) of the Act, 21 U.S.C. section 360bbb-3(b)(1), unless the authorization is terminated or revoked.  Performed at [facility], [address], [place], [place] [address]       Radiology Studies: No results found.    Scheduled Meds: . amLODipine  5 mg Oral Daily  . aspirin EC  81 mg Oral Daily  . atorvastatin  80 mg Oral Daily  . enoxaparin (LOVENOX) injection  40 mg Subcutaneous Q24H  . FLUoxetine  20 mg Oral Daily  . hydrochlorothiazide  12.5 mg Oral Daily  . insulin aspart  0-9 Units Subcutaneous TID WC  . insulin detemir  10 Units Subcutaneous QHS  . levETIRAcetam  1,000 mg Oral BID  . ticagrelor  90 mg Oral BID   Continuous Infusions:    LOS: 7 days      Time spent: 20 minutes   Noralee Stain, DO [facility] 06/22/2020, 12:17 PM   Available via Epic secure chat 7am-7pm After these hours, please refer to coverage provider listed on [email or web address]

## 2020-06-22 NOTE — Progress Notes (Signed)
Physical Therapy Treatment Patient Details Name: Nathaniel Spears MRN: [id number] DOB: [birth year] Today's Date: 06/22/2020    History of Present Illness Nathaniel Spears is a 45 yo male admitted on 06/15/2020 with c/o aphasia,  with multiple recent admits for repeated strokes. Pt most recently admitted 5/20-5/23 for acute strokes before being transferred at family request to [facility] for further work-up. While in the ED, pt had worsening aphasia compared to baseline. MRI reveals several new or increased acute infarcts are  demonstrated in both cerebral hemispheres (today the bilateral MCA  and left ACA territories) since the recent MRI 06/04/2020. PMH includes HTN, DM2, HLD, multiple recent admits for repeated strokes.    PT Comments    Pt required min guard assist transfers and min assist ambulation 150' HHA on R. Pt presents with slow, guarded gait. Poor standing balance. Following simple commands with increased time 75% of trials. Pt in recliner with feet elevated at end of session.    Follow Up Recommendations  CIR     Equipment Recommendations  None recommended by PT    Recommendations for Other Services       Precautions / Restrictions Precautions Precautions: Fall;Other (comment) Precaution Comments: expressive and receptive aphasia, watch BP    Mobility  Bed Mobility Overal bed mobility: Needs Assistance Bed Mobility: Supine to Sit;Rolling Rolling: Modified independent (Device/Increase time)   Supine to sit: Min assist;HOB elevated     General bed mobility comments: cues for sequencing, increased time, +rail    Transfers Overall transfer level: Needs assistance Equipment used: None Transfers: Sit to/from [employer] Sit to Stand: Min guard Stand pivot transfers: Min guard       General transfer comment: min guard for safety, increased time  Ambulation/Gait Ambulation/Gait assistance: Min assist Gait Distance (Feet): 150 Feet Assistive device: 1 person  hand held assist Gait Pattern/deviations: Step-through pattern;Decreased stride length;Wide base of support Gait velocity: decreased Gait velocity interpretation: <1.8 ft/sec, indicate of risk for recurrent falls General Gait Details: slow, guarded gait; max directional cues needed; BUE positioned in flexed postion   Stairs             Wheelchair Mobility    Modified Rankin (Stroke Patients Only) Modified Rankin (Stroke Patients Only) Pre-Morbid Rankin Score: Moderate disability Modified Rankin: Moderately severe disability     Balance Overall balance assessment: Needs assistance Sitting-balance support: Feet supported;No upper extremity supported Sitting balance-Leahy Scale: Good     Standing balance support: No upper extremity supported;Single extremity supported;During functional activity Standing balance-Leahy Scale: Poor Standing balance comment: reliant on external support                            Cognition Arousal/Alertness: Awake/alert Behavior During Therapy: Flat affect Overall Cognitive Status: Difficult to assess Area of Impairment: Following commands;Safety/judgement;Awareness;Problem solving;Attention                   Current Attention Level: Sustained   Following Commands: Follows one step commands inconsistently;Follows one step commands with increased time Safety/Judgement: Decreased awareness of safety;Decreased awareness of deficits Awareness: Intellectual Problem Solving: Difficulty sequencing;Requires verbal cues;Requires tactile cues;Slow processing General Comments: Pt demonstrates frustration with expressive difficulties. Easily distracted. Cues to stay on task.      Exercises      General Comments        Pertinent Vitals/Pain Pain Assessment: Faces Faces Pain Scale: No hurt    Home Living  Prior Function            PT Goals (current goals can now be found in the care plan  section) Acute Rehab PT Goals Patient Stated Goal: not stated Progress towards PT goals: Progressing toward goals    Frequency    Min 4X/week      PT Plan Current plan remains appropriate    Co-evaluation              AM-PAC PT "6 Clicks" Mobility   Outcome Measure  Help needed turning from your back to your side while in a flat bed without using bedrails?: None Help needed moving from lying on your back to sitting on the side of a flat bed without using bedrails?: A Little Help needed moving to and from a bed to a chair (including a wheelchair)?: A Little Help needed standing up from a chair using your arms (e.g., wheelchair or bedside chair)?: A Little Help needed to walk in hospital room?: A Little Help needed climbing 3-5 steps with a railing? : A Lot 6 Click Score: 18    End of Session Equipment Utilized During Treatment: Gait belt Activity Tolerance: Patient tolerated treatment well Patient left: in chair;with call bell/phone within reach;with chair alarm set Nurse Communication: Mobility status PT Visit Diagnosis: Unsteadiness on feet (R26.81);Other abnormalities of gait and mobility (R26.89);Muscle weakness (generalized) (M62.81);Difficulty in walking, not elsewhere classified (R26.2);Apraxia (R48.2)     Time: 1962-2297 PT Time Calculation (min) (ACUTE ONLY): 25 min  Charges:  $Gait Training: 23-37 mins                     Aida Raider, [place]  Office # [phone] Pager [phone]    Ilda Foil 06/22/2020, 9:17 AM

## 2020-06-22 NOTE — Plan of Care (Signed)
  Problem: Education: Goal: Knowledge of disease or condition will improve Outcome: Progressing Goal: Knowledge of secondary prevention will improve Outcome: Progressing Goal: Knowledge of patient specific risk factors addressed and post discharge goals established will improve Outcome: Progressing Goal: Individualized Educational Video(s) Outcome: Progressing   Problem: Coping: Goal: Will verbalize positive feelings about self Outcome: Progressing Goal: Will identify appropriate support needs Outcome: Progressing   Problem: Health Behavior/Discharge Planning: Goal: Ability to manage health-related needs will improve Outcome: Progressing   Problem: Intracerebral Hemorrhage Tissue Perfusion: Goal: Complications of Intracerebral Hemorrhage will be minimized Outcome: Progressing   Problem: Ischemic Stroke/TIA Tissue Perfusion: Goal: Complications of ischemic stroke/TIA will be minimized Outcome: Progressing   Problem: Spontaneous Subarachnoid Hemorrhage Tissue Perfusion: Goal: Complications of Spontaneous Subarachnoid Hemorrhage will be minimized Outcome: Progressing   

## 2020-06-23 ENCOUNTER — Encounter (HOSPITAL_COMMUNITY): Payer: Self-pay | Admitting: Physical Medicine and Rehabilitation

## 2020-06-23 ENCOUNTER — Inpatient Hospital Stay (HOSPITAL_COMMUNITY)
Admission: RE | Admit: 2020-06-23 | Discharge: 2020-07-08 | DRG: 057 | Disposition: A | Discharge status: Home Health | Payer: Medicaid Other | Source: Intra-hospital | Attending: Physical Medicine and Rehabilitation | Admitting: Physical Medicine and Rehabilitation

## 2020-06-23 ENCOUNTER — Other Ambulatory Visit: Payer: Self-pay

## 2020-06-23 DIAGNOSIS — R000 Tachycardia, unspecified: Secondary | ICD-10-CM | POA: Diagnosis not present

## 2020-06-23 DIAGNOSIS — Z79899 Other long term (current) drug therapy: Secondary | ICD-10-CM | POA: Diagnosis not present

## 2020-06-23 DIAGNOSIS — Z8673 Personal history of transient ischemic attack (TIA), and cerebral infarction without residual deficits: Secondary | ICD-10-CM | POA: Diagnosis not present

## 2020-06-23 DIAGNOSIS — Z87891 Personal history of nicotine dependence: Secondary | ICD-10-CM | POA: Diagnosis not present

## 2020-06-23 DIAGNOSIS — E559 Vitamin D deficiency, unspecified: Secondary | ICD-10-CM | POA: Diagnosis present

## 2020-06-23 DIAGNOSIS — E119 Type 2 diabetes mellitus without complications: Secondary | ICD-10-CM | POA: Diagnosis present

## 2020-06-23 DIAGNOSIS — K5900 Constipation, unspecified: Secondary | ICD-10-CM | POA: Diagnosis not present

## 2020-06-23 DIAGNOSIS — Z7902 Long term (current) use of antithrombotics/antiplatelets: Secondary | ICD-10-CM

## 2020-06-23 DIAGNOSIS — E785 Hyperlipidemia, unspecified: Secondary | ICD-10-CM | POA: Diagnosis present

## 2020-06-23 DIAGNOSIS — R4701 Aphasia: Secondary | ICD-10-CM | POA: Diagnosis not present

## 2020-06-23 DIAGNOSIS — I69319 Unspecified symptoms and signs involving cognitive functions following cerebral infarction: Secondary | ICD-10-CM

## 2020-06-23 DIAGNOSIS — Z794 Long term (current) use of insulin: Secondary | ICD-10-CM | POA: Diagnosis not present

## 2020-06-23 DIAGNOSIS — K5901 Slow transit constipation: Secondary | ICD-10-CM | POA: Diagnosis not present

## 2020-06-23 DIAGNOSIS — I639 Cerebral infarction, unspecified: Secondary | ICD-10-CM

## 2020-06-23 DIAGNOSIS — N179 Acute kidney failure, unspecified: Secondary | ICD-10-CM | POA: Diagnosis present

## 2020-06-23 DIAGNOSIS — Z7984 Long term (current) use of oral hypoglycemic drugs: Secondary | ICD-10-CM | POA: Diagnosis not present

## 2020-06-23 DIAGNOSIS — Z7982 Long term (current) use of aspirin: Secondary | ICD-10-CM | POA: Diagnosis not present

## 2020-06-23 DIAGNOSIS — I6603 Occlusion and stenosis of bilateral middle cerebral arteries: Secondary | ICD-10-CM | POA: Diagnosis present

## 2020-06-23 DIAGNOSIS — E876 Hypokalemia: Secondary | ICD-10-CM | POA: Diagnosis present

## 2020-06-23 DIAGNOSIS — T383X5A Adverse effect of insulin and oral hypoglycemic [antidiabetic] drugs, initial encounter: Secondary | ICD-10-CM | POA: Diagnosis not present

## 2020-06-23 DIAGNOSIS — R197 Diarrhea, unspecified: Secondary | ICD-10-CM | POA: Diagnosis not present

## 2020-06-23 DIAGNOSIS — I69398 Other sequelae of cerebral infarction: Secondary | ICD-10-CM | POA: Diagnosis not present

## 2020-06-23 DIAGNOSIS — I69354 Hemiplegia and hemiparesis following cerebral infarction affecting left non-dominant side: Principal | ICD-10-CM

## 2020-06-23 DIAGNOSIS — I10 Essential (primary) hypertension: Secondary | ICD-10-CM | POA: Diagnosis not present

## 2020-06-23 DIAGNOSIS — I69320 Aphasia following cerebral infarction: Secondary | ICD-10-CM | POA: Diagnosis not present

## 2020-06-23 DIAGNOSIS — R2689 Other abnormalities of gait and mobility: Secondary | ICD-10-CM | POA: Diagnosis present

## 2020-06-23 LAB — CBC
HCT: 40.6 % (ref 39.0–52.0)
Hemoglobin: 13.1 g/dL (ref 13.0–17.0)
MCH: 25.8 pg — ABNORMAL LOW (ref 26.0–34.0)
MCHC: 32.3 g/dL (ref 30.0–36.0)
MCV: 80.1 fL (ref 80.0–100.0)
Platelets: 203 10*3/uL (ref 150–400)
RBC: 5.07 MIL/uL (ref 4.22–5.81)
RDW: 13.7 % (ref 11.5–15.5)
WBC: 5.5 10*3/uL (ref 4.0–10.5)
nRBC: 0 % (ref 0.0–0.2)

## 2020-06-23 LAB — GLUCOSE, CAPILLARY
Glucose-Capillary: 123 mg/dL — ABNORMAL HIGH (ref 70–99)
Glucose-Capillary: 116 mg/dL — ABNORMAL HIGH (ref 70–99)
Glucose-Capillary: 178 mg/dL — ABNORMAL HIGH (ref 70–99)
Glucose-Capillary: 150 mg/dL — ABNORMAL HIGH (ref 70–99)

## 2020-06-23 LAB — CREATININE, SERUM
Creatinine, Ser: 1.35 mg/dL — ABNORMAL HIGH (ref 0.61–1.24)
GFR, Estimated: >60 mL/min (ref >60)

## 2020-06-23 MED ORDER — ENOXAPARIN SODIUM 40 MG/0.4ML IJ SOSY
40.0000 mg | PREFILLED_SYRINGE | INTRAMUSCULAR | Status: DC
  Administered 2020-06-24 – 2020-06-28 (×5): 40 mg via SUBCUTANEOUS
  Filled 2020-06-23 (×5): qty 0.4

## 2020-06-23 MED ORDER — LEVETIRACETAM 500 MG PO TABS
1000.0000 mg | ORAL_TABLET | Freq: Two times a day (BID) | ORAL | Status: DC
  Administered 2020-06-23 – 2020-07-05 (×24): 1000 mg via ORAL
  Filled 2020-06-23 (×25): qty 2

## 2020-06-23 MED ORDER — INSULIN DETEMIR 100 UNIT/ML ~~LOC~~ SOLN: 10.0000 [IU] | Freq: Every day | SUBCUTANEOUS | 11 refills | Status: DC

## 2020-06-23 MED ORDER — TICAGRELOR 90 MG PO TABS
90.0000 mg | ORAL_TABLET | Freq: Two times a day (BID) | ORAL | Status: DC
  Administered 2020-06-23 – 2020-07-08 (×30): 90 mg via ORAL
  Filled 2020-06-23 (×30): qty 1

## 2020-06-23 MED ORDER — FLUOXETINE HCL 20 MG PO CAPS
20.0000 mg | ORAL_CAPSULE | Freq: Every day | ORAL | Status: DC
  Administered 2020-06-24 – 2020-07-08 (×15): 20 mg via ORAL
  Filled 2020-06-23 (×15): qty 1

## 2020-06-23 MED ORDER — HYDROCHLOROTHIAZIDE 12.5 MG PO CAPS
12.5000 mg | ORAL_CAPSULE | Freq: Every day | ORAL | Status: DC
  Administered 2020-06-24 – 2020-07-06 (×13): 12.5 mg via ORAL
  Filled 2020-06-23 (×13): qty 1

## 2020-06-23 MED ORDER — EXERCISE FOR HEART AND HEALTH BOOK
Freq: Once | Status: AC
  Filled 2020-06-23: qty 1

## 2020-06-23 MED ORDER — ACETAMINOPHEN 650 MG RE SUPP: 650.0000 mg | RECTAL | Status: DC | PRN

## 2020-06-23 MED ORDER — HYDROCHLOROTHIAZIDE 12.5 MG PO CAPS: 12.5000 mg | ORAL_CAPSULE | Freq: Every day | ORAL | Status: DC

## 2020-06-23 MED ORDER — TICAGRELOR 90 MG PO TABS: 90.0000 mg | ORAL_TABLET | Freq: Two times a day (BID) | ORAL | Status: DC

## 2020-06-23 MED ORDER — ATORVASTATIN CALCIUM 80 MG PO TABS
80.0000 mg | ORAL_TABLET | Freq: Every day | ORAL | Status: DC
  Administered 2020-06-24 – 2020-07-08 (×15): 80 mg via ORAL
  Filled 2020-06-23 (×16): qty 1

## 2020-06-23 MED ORDER — AMLODIPINE BESYLATE 5 MG PO TABS
5.0000 mg | ORAL_TABLET | Freq: Every day | ORAL | Status: DC
  Administered 2020-06-24: 5 mg via ORAL
  Filled 2020-06-23: qty 1

## 2020-06-23 MED ORDER — INSULIN ASPART 100 UNIT/ML IJ SOLN
0.0000 [IU] | Freq: Three times a day (TID) | INTRAMUSCULAR | Status: DC
  Administered 2020-06-23: 2 [IU] via SUBCUTANEOUS
  Administered 2020-06-24 – 2020-07-01 (×9): 1 [IU] via SUBCUTANEOUS

## 2020-06-23 MED ORDER — ENOXAPARIN SODIUM 40 MG/0.4ML IJ SOSY: 40.0000 mg | PREFILLED_SYRINGE | INTRAMUSCULAR | Status: DC

## 2020-06-23 MED ORDER — ACETAMINOPHEN 160 MG/5ML PO SOLN: 650.0000 mg | ORAL | Status: DC | PRN

## 2020-06-23 MED ORDER — FLUOXETINE HCL 20 MG PO CAPS: 20.0000 mg | ORAL_CAPSULE | Freq: Every day | ORAL | 3 refills | Status: DC

## 2020-06-23 MED ORDER — INSULIN DETEMIR 100 UNIT/ML ~~LOC~~ SOLN
10.0000 [IU] | Freq: Every day | SUBCUTANEOUS | Status: DC
  Administered 2020-06-23 – 2020-06-30 (×8): 10 [IU] via SUBCUTANEOUS
  Filled 2020-06-23 (×10): qty 0.1

## 2020-06-23 MED ORDER — INSULIN ASPART 100 UNIT/ML IJ SOLN: 0.0000 [IU] | Freq: Three times a day (TID) | INTRAMUSCULAR | 11 refills | Status: DC

## 2020-06-23 MED ORDER — AMLODIPINE BESYLATE 5 MG PO TABS: 5.0000 mg | ORAL_TABLET | Freq: Every day | ORAL | Status: DC

## 2020-06-23 MED ORDER — LEVETIRACETAM 1000 MG PO TABS: 1000.0000 mg | ORAL_TABLET | Freq: Two times a day (BID) | ORAL | Status: DC

## 2020-06-23 MED ORDER — ACETAMINOPHEN 325 MG PO TABS
650.0000 mg | ORAL_TABLET | ORAL | Status: DC | PRN
  Administered 2020-06-24: 650 mg via ORAL
  Filled 2020-06-23 (×2): qty 2

## 2020-06-23 MED ORDER — ASPIRIN EC 81 MG PO TBEC
81.0000 mg | DELAYED_RELEASE_TABLET | Freq: Every day | ORAL | Status: DC
  Administered 2020-06-24 – 2020-07-08 (×15): 81 mg via ORAL
  Filled 2020-06-23 (×15): qty 1

## 2020-06-23 MED ORDER — LIVING WELL WITH DIABETES BOOK
Freq: Once | Status: AC
  Filled 2020-06-23: qty 1

## 2020-06-23 NOTE — Progress Notes (Signed)
Occupational Therapy Treatment Patient Details Name: Nathaniel Spears MRN: [id number] DOB: 1975/07/02 Today's Date: 06/23/2020    History of present illness Nathaniel Spears is a 45 yo male admitted on 06/15/2020 with c/o aphasia,  with multiple recent admits for repeated strokes. Pt most recently admitted 5/20-5/23 for acute strokes before being transferred at family request to [facility] for further work-up. While in the ED, pt had worsening aphasia compared to baseline. MRI reveals several new or increased acute infarcts are  demonstrated in both cerebral hemispheres (today the bilateral MCA  and left ACA territories) since the recent MRI 06/04/2020. PMH includes HTN, DM2, HLD, multiple recent admits for repeated strokes.   OT comments  Patient is making slow progress toward patient focused OT goals.  He is moving better functionally, and his balance is fair for dynamic stand, his expressive and receptive aphasia are the barrier.  He is very unsure what is being asked of him, and thus his mobility is not fluid, and he continually looks over at the therapist wondering if he is doing what is asked of him.  With supervision, and Mod verbal cues he was able to identify his room and verbalize his room number. He required assist with turning on, and off, the faucets while performing stand grooming this date, couldn't figured out which facet was running, and which was already off.  He is scheduled for CIR admission today, but OT will continue to follow him in the acute setting.    Follow Up Recommendations  CIR    Equipment Recommendations       Recommendations for Other Services      Precautions / Restrictions Precautions Precautions: Fall Precaution Comments: expressive and receptive aphasia Restrictions Weight Bearing Restrictions: No       Mobility Bed Mobility Overal bed mobility: Needs Assistance Bed Mobility: Supine to Sit     Supine to sit: Modified independent (Device/Increase time);HOB  elevated     General bed mobility comments: up in recliner Patient Response: Cooperative  Transfers Overall transfer level: Needs assistance Equipment used: None Transfers: Sit to/from [employer] Sit to Stand: Supervision Stand pivot transfers: Supervision       General transfer comment: min guard for safety    Balance Overall balance assessment: Needs assistance Sitting-balance support: Feet supported;No upper extremity supported Sitting balance-Leahy Scale: Good     Standing balance support: No upper extremity supported Standing balance-Leahy Scale: Fair Standing balance comment: reliant on external support                           ADL either performed or assessed with clinical judgement   ADL Overall ADL's : Needs assistance/impaired     Grooming: Standing;Cueing for sequencing                   Toilet Transfer: Supervision/safety;Ambulation;Regular Toilet   Toileting- Architect and Hygiene: Supervision/safety;Sit to/from stand       Functional mobility during ADLs: Supervision/safety                         Cognition Arousal/Alertness: Awake/alert Behavior During Therapy: WFL for tasks assessed/performed Overall Cognitive Status: Impaired/Different from baseline Area of Impairment: Following commands;Safety/judgement;Awareness;Problem solving;Attention                   Current Attention Level: Sustained Memory: Decreased short-term memory Following Commands: Follows one step commands inconsistently;Follows one step commands with increased time  Safety/Judgement: Decreased awareness of safety;Decreased awareness of deficits Awareness: Intellectual Problem Solving: Difficulty sequencing;Requires verbal cues;Requires tactile cues;Slow processing General Comments: Demos frustration with expressive difficulties. Hard to differentiate STM deficits vs expressive aphasia with wayfinding back to room  and verbal recall of room number when given choices. Tends to pick 2nd option each time        Exercises Exercises: Other exercises Other Exercises Other Exercises: Sit to stand x 5 from recliner Other Exercises: Seated marching and LAQs x 10 each   Shoulder Instructions       General Comments      Pertinent Vitals/ Pain       Pain Assessment: Faces Faces Pain Scale: No hurt Pain Intervention(s): Monitored during session                                                          Frequency  Min 2X/week        Progress Toward Goals  OT Goals(current goals can now be found in the care plan section)  Progress towards OT goals: Progressing toward goals  Acute Rehab OT Goals Patient Stated Goal: not stated OT Goal Formulation: Patient unable to participate in goal setting Time For Goal Achievement: 07/01/20 Potential to Achieve Goals: Good  Plan Discharge plan remains appropriate    Co-evaluation                 AM-PAC OT "6 Clicks" Daily Activity     Outcome Measure   Help from another person eating meals?: A Lot Help from another person taking care of personal grooming?: A Lot Help from another person toileting, which includes using toliet, bedpan, or urinal?: A Lot Help from another person bathing (including washing, rinsing, drying)?: A Lot Help from another person to put on and taking off regular upper body clothing?: A Lot Help from another person to put on and taking off regular lower body clothing?: A Lot 6 Click Score: 12    End of Session Equipment Utilized During Treatment: Gait belt  OT Visit Diagnosis: Muscle weakness (generalized) (M62.81);Other symptoms and signs involving cognitive function;Cognitive communication deficit (R41.841)   Activity Tolerance Patient tolerated treatment well   Patient Left in chair;with call bell/phone within reach;with chair alarm set   Nurse Communication          Time:  1241-1300 OT Time Calculation (min): 19 min  Charges: OT General Charges $OT Visit: 1 Visit OT Treatments $Self Care/Home Management : 8-22 mins  06/23/2020  Rich, OTR/L  Acute Rehabilitation Services  Office:  [phone]    Suzanna Obey 06/23/2020, 1:40 PM

## 2020-06-23 NOTE — Therapy (Signed)
[facility] [address] [place], [place], [address] Phone: [phone]   Fax:  [phone]  Patient Details  Name: Nathaniel Spears MRN: [id number] Date of Birth: [birth year] Referring Provider:  Tawni Millers, MD (Doc= Juluis Mire NP- PCP)  Encounter Date: 06/23/2020   SPEECH THERAPY DISCHARGE SUMMARY  Visits from Start of Care: 8  Current functional level related to goals / functional outcomes: Aniruddh did not return to [facility] as pt was admitted to hospital for stroke. Pt made some minimal progress targeting mod-severe aphasia and apraxia prior to hospital admission. Pt was discharged from [facility] due to change in condition resulting in hospitalization.     Remaining deficits: Aphasia, apraxia, dysarthria   Education / Equipment: Aphasia compensations, caregiver education, HEP Plan: Patient agrees to discharge.  Patient goals were partially met. Patient is being discharged due to a change in medical status.  ?????        SLP Short Term Goals - 05/27/20 1603              SLP SHORT TERM GOAL #1    Title Pt will name 10 personally relevant words/phrases with usual min A over 3 sessions     Baseline 05-25-20     Status Partially Met          SLP SHORT TERM GOAL #2    Title Pt will verbalize 2-3 word sentences in response to simple conversational dialogue with usual min A over 3 sessions     Baseline 05-20-20     Status Partially Met          SLP SHORT TERM GOAL #3    Title Pt will use mulitmodal communication (gesture, draw, write 1st letter etc) to augment verbal expression with usual min A over 3 sessions     Status Not Met          SLP SHORT TERM GOAL #4    Title Pt will use external visual communication aids to express wants/needs for 4/5 opportunities with usual min A     Status Not Met          SLP SHORT TERM GOAL #5    Title Pt/caregiver will complete communication  effectivness QOL scale in first 2 sessions     Baseline CES=12 & SF=19     Status Achieved             SLP Long Term Goals - 05/27/20 1604              SLP LONG TERM GOAL #1    Title Pt will verbalize 3-4 word sentences in response to simple open ended questions with usual min A over 2 sessions     Time 5     Period Weeks     Status Revised          SLP LONG TERM GOAL #2    Title Pt will use mulitmodal communication (gesture, draw, write 1st letter etc) to augment verbal expression to meet needs at home with occasional min A from family.     Time 5     Period Weeks     Status Revised          SLP LONG TERM GOAL #3    Title Pt/caregiver will report improvements in frustration level and communication effectivness via QOL scale by last ST session     Time 5     Period Weeks     Status On-going  Alinda Deem, MA CCC-SLP 06/23/2020, 4:38 PM  [facility] [address] [place], [place], [address] Phone: [phone]   Fax:  [phone]

## 2020-06-23 NOTE — Progress Notes (Signed)
Physical Therapy Treatment Patient Details Name: Nathaniel Spears MRN: [id number] DOB: 08/15/1975 Today's Date: 06/23/2020    History of Present Illness Nathaniel Spears is a 45 yo male admitted on 06/15/2020 with c/o aphasia,  with multiple recent admits for repeated strokes. Pt most recently admitted 5/20-5/23 for acute strokes before being transferred at family request to [facility] for further work-up. While in the ED, pt had worsening aphasia compared to baseline. MRI reveals several new or increased acute infarcts are  demonstrated in both cerebral hemispheres (today the bilateral MCA  and left ACA territories) since the recent MRI 06/04/2020. PMH includes HTN, DM2, HLD, multiple recent admits for repeated strokes.    PT Comments    Patient agreeable to participate in therapy session. Patient demonstrating frustration with expressive difficulties. Engaged patient in wayfinding back to room with difficulty identifying room number even with given choices. Patient continues to require minA for ambulation with and without HHA for balance and safety. Continue to recommend comprehensive inpatient rehab (CIR) for post-acute therapy needs.     Follow Up Recommendations  CIR     Equipment Recommendations  None recommended by PT    Recommendations for Other Services       Precautions / Restrictions Precautions Precautions: Fall;Other (comment) Precaution Comments: expressive and receptive aphasia, watch BP Restrictions Weight Bearing Restrictions: No    Mobility  Bed Mobility Overal bed mobility: Needs Assistance Bed Mobility: Supine to Sit     Supine to sit: Modified independent (Device/Increase time);HOB elevated     General bed mobility comments: increased time    Transfers Overall transfer level: Needs assistance Equipment used: None Transfers: Sit to/from Stand Sit to Stand: Min guard         General transfer comment: min guard for safety  Ambulation/Gait Ambulation/Gait  assistance: Min assist Gait Distance (Feet): 150 Feet Assistive device: 1 person hand held assist;None Gait Pattern/deviations: Step-through pattern;Decreased stride length;Wide base of support Gait velocity: decreased   General Gait Details: slow guarded gait with tendency to hold L UE in elbow flexion. Directional cues needed for wayfinding when given room number choice. MinA for balance   Stairs             Wheelchair Mobility    Modified Rankin (Stroke Patients Only) Modified Rankin (Stroke Patients Only) Pre-Morbid Rankin Score: Moderate disability Modified Rankin: Moderately severe disability     Balance Overall balance assessment: Needs assistance Sitting-balance support: Feet supported;No upper extremity supported Sitting balance-Leahy Scale: Good     Standing balance support: No upper extremity supported;Single extremity supported;During functional activity Standing balance-Leahy Scale: Poor Standing balance comment: reliant on external support                            Cognition Arousal/Alertness: Awake/alert Behavior During Therapy: Flat affect Overall Cognitive Status: Difficult to assess Area of Impairment: Following commands;Safety/judgement;Awareness;Problem solving;Attention                   Current Attention Level: Sustained Memory: Decreased short-term memory Following Commands: Follows one step commands inconsistently;Follows one step commands with increased time Safety/Judgement: Decreased awareness of safety;Decreased awareness of deficits Awareness: Intellectual Problem Solving: Difficulty sequencing;Requires verbal cues;Requires tactile cues;Slow processing General Comments: Demos frustration with expressive difficulties. Hard to differentiate STM deficits vs expressive aphasia with wayfinding back to room and verbal recall of room number when given choices. Tends to pick 2nd option each time      Exercises Other  Exercises Other Exercises: Sit to stand x 5 from recliner Other Exercises: Seated marching and LAQs x 10 each    General Comments        Pertinent Vitals/Pain Pain Assessment: No/denies pain    Home Living                      Prior Function            PT Goals (current goals can now be found in the care plan section) Acute Rehab PT Goals Patient Stated Goal: not stated PT Goal Formulation: Patient unable to participate in goal setting Time For Goal Achievement: 07/01/20 Potential to Achieve Goals: Good Progress towards PT goals: Progressing toward goals    Frequency    Min 4X/week      PT Plan Current plan remains appropriate    Co-evaluation              AM-PAC PT "6 Clicks" Mobility   Outcome Measure  Help needed turning from your back to your side while in a flat bed without using bedrails?: None Help needed moving from lying on your back to sitting on the side of a flat bed without using bedrails?: None Help needed moving to and from a bed to a chair (including a wheelchair)?: A Little Help needed standing up from a chair using your arms (e.g., wheelchair or bedside chair)?: A Little Help needed to walk in hospital room?: A Little Help needed climbing 3-5 steps with a railing? : A Little 6 Click Score: 20    End of Session Equipment Utilized During Treatment: Gait belt Activity Tolerance: Patient tolerated treatment well Patient left: in chair;with call bell/phone within reach;with chair alarm set Nurse Communication: Mobility status PT Visit Diagnosis: Unsteadiness on feet (R26.81);Other abnormalities of gait and mobility (R26.89);Muscle weakness (generalized) (M62.81);Difficulty in walking, not elsewhere classified (R26.2);Apraxia (R48.2)     Time: 7672-0947 PT Time Calculation (min) (ACUTE ONLY): 29 min  Charges:  $Therapeutic Activity: 23-37 mins                     Nathaniel Spears A. Nathaniel Spears PT, DPT Acute Rehabilitation Services Pager  [phone] Office [phone]    Nathaniel Spears 06/23/2020, 1:07 PM

## 2020-06-23 NOTE — Progress Notes (Signed)
Inpatient Rehab Admissions Coordinator:   I have a CIR bed for this Pt. Today. RN may call report to [phone] after 12pm today/  Megan Salon, MS, CCC-SLP Rehab Admissions Coordinator  [phone] (celll) [phone] (office)

## 2020-06-23 NOTE — TOC Transition Note (Signed)
Transition of Care [facility]) - CM/SW Discharge Note   Patient Details  Name: Nathaniel Spears MRN: [id number] Date of Birth: 1975-02-10  [facility] CM/SW Contact:  Kermit Balo, RN Phone Number: 06/23/2020, 10:17 AM   Clinical Narrative:    Patient is discharging to CIR today. CM signing off.   Final next level of care: IP Rehab Facility Barriers to Discharge: Inadequate or no insurance,Barriers Unresolved (comment)   Patient Goals and CMS Choice        Discharge Placement                       Discharge Plan and Services                                     Social Determinants of Health (SDOH) Interventions     Readmission Risk Interventions No flowsheet data found.

## 2020-06-23 NOTE — Discharge Summary (Signed)
Physician Discharge Summary  Nathaniel Spears MVH:[id number] DOB: 1975/10/10 DOA: 06/15/2020  PCP: Kerin Perna, NP  Admit date: 06/15/2020 Discharge date: 06/23/2020  Admitted From: home  Disposition:  CIR   Recommendations for Outpatient Follow-up:  1. F/u on diabetes management   Discharge Condition:  stable   CODE STATUS:  Full code   Diet recommendation:  Heart healthy, diabetic Consultations:  neurology  Procedures/Studies: . EEG   Discharge Diagnoses:  Principal Problem:   Acute ischemic stroke [facility]) Active Problems:   Essential hypertension   Type 2 diabetes mellitus with hyperglycemia, with long-term current use of insulin (HCC)     Brief Summary: This is a 45 y/o make with HTN, DM2, HLD and recent CVAs. He was admitted on 5/20 through 5/23 forCVAs. 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   Admitted on 5/15 for dizziness, weakness, a fall and persistent expressive and receptive aphasia. MRI showed "small acute infarcts within the cortical and subcortical frontal lobes bilaterally, and within the left callosal body/genu (bilateral MCA and ACA territories). The largest acute infarct is present within the left callosal body/genu, measuring 2 cm." He was discharged home on 5/19 on aspirin, plavix and statin.   5/19> He returned to the ED on the same day when family noted that he was staring to the right and would not answer questions. This resolved before EMS arrived. He did not get admitted  He returned to the ED again on 5/20 due to a fall and was found to have left leg weakness. Code stroke was called.  MRI revealved "Bilateral recent infarcts in the anterior and middle cerebral artery territories ". His family requested transfer to [facility] and he was transferred there on 5/22 where he had a work up for vasculitis which was unrevealing.   He returned to the ED on 5/31 for an episode of confusion, not  responding to commands which lasted 30 min. MRI revealed several new bilateral infarcts (see report below)    Hospital Course:   Recurrent CVAs - found to have multifocal severe intracranial stenosis including severe stenosis of left ant cerebral artery and severe b/lo MCA branches - TEE 5/17- no thrombus noted - LCL 39 - A1c 10.3 - remaining neuro work up negative - Plavix changed to Brilinta, continued on Aspirin 325 mg - cont this regimen for 30 days followed by ASA 81 mg and Plavix 75 mg BID for 2 months followed by Plavix alone - transferring to CIR today for rehab Note>  Family requested transfer to [facility] and were told by [facility] that a transfer was not indicated.   Probably seizures - having "staring spells" per family- Started on Keppra - EEG>  This studyis suggestive of cortical dysfunction in right hemisphere likely secondary to underlying stroke. Additionally, there is moderate diffuse encephalopathy. - LT EEG revealed a non convulsive seizure  - Keppra dose on dc today is 1000 mg BID   DM2 uncontrolled with hyperglycemia - A1c 10.3- needs better control -cont Levemir 10 Units, Metformin and low dose Novolog SSI at meals- sugars well controlled in the hospital  HTN - resumed HCTZ and Amlodipine  Depression - Started on Fluoxetine/Prozac 20 mg daily by neurology   Discharge Exam: Vitals:   06/23/20 0734 06/23/20 1050  BP: (!) 124/91 (!) 139/102  Pulse: 79   Resp: 18   Temp: 98.3 F (36.8 C)   SpO2: 100%    Vitals:  06/22/20 2003 06/23/20 0342 06/23/20 0734 06/23/20 1050  BP: 140/90 (!) 146/84 (!) 124/91 (!) 139/102  Pulse: 89 89 79   Resp: _0 Temp: 98.3 F (36.8 C) 97.9 F (36.6 C) 98.3 F (36.8 C)   TempSrc: Oral Axillary Oral   SpO2: 94% 100% 100%   Weight:      Height:        General: Pt is alert, awake, not in acute distress Cardiovascular: RRR, S1/S2 +, no rubs, no gallops Respiratory: CTA bilaterally, no wheezing, no rhonchi Abdominal:  Soft, NT, ND, bowel sounds + Extremities: no edema, no cyanosis Neuro: significant dysarthria and expressive aphasia   Discharge Instructions  Discharge Instructions    Diet - low sodium heart healthy   Complete by: As directed      Allergies as of 06/23/2020   No Known Allergies     Medication List    STOP taking these medications   Basaglar KwikPen 100 UNIT/ML   blood glucose meter kit and supplies   clopidogrel 75 MG tablet Commonly known as: PLAVIX   Levemir FlexTouch 100 UNIT/ML FlexPen Generic drug: insulin detemir Replaced by: insulin detemir 100 UNIT/ML injection   methylPREDNISolone sodium succinate 1,000 mg in sodium chloride 0.9 % 50 mL   True Metrix Blood Glucose Test test strip Generic drug: glucose blood   True Metrix Meter w/Device Kit   TRUEplus Lancets 28G Misc     TAKE these medications   acetaminophen 325 MG tablet Commonly known as: TYLENOL Take 2 tablets (650 mg total) by mouth every 4 (four) hours as needed for mild pain (or temp > 37.5 C (99.5 F)).   amLODipine 5 MG tablet Commonly known as: NORVASC Take 1 tablet (5 mg total) by mouth daily. Start taking on: June 24, 2020   aspirin 325 MG EC tablet Take 1 tablet (325 mg total) by mouth daily.   atorvastatin 40 MG tablet Commonly known as: LIPITOR Take 2 tablets (80 mg total) by mouth daily. What changed: Another medication with the same name was removed. Continue taking this medication, and follow the directions you see here.   FLUoxetine 20 MG capsule Commonly known as: PROZAC Take 1 capsule (20 mg total) by mouth daily. Start taking on: June 24, 2020   hydrochlorothiazide 12.5 MG capsule Commonly known as: MICROZIDE Take 1 capsule (12.5 mg total) by mouth daily. Start taking on: June 24, 2020   insulin aspart 100 UNIT/ML injection Commonly known as: novoLOG Inject 0-9 Units into the skin 3 (three) times daily with meals.   insulin detemir 100 UNIT/ML injection Commonly known  as: LEVEMIR Inject 0.1 mLs (10 Units total) into the skin at bedtime. Replaces: Levemir FlexTouch 100 UNIT/ML FlexPen   levETIRAcetam 1000 MG tablet Commonly known as: KEPPRA Take 1 tablet (1,000 mg total) by mouth 2 (two) times daily. What changed:   medication strength  how much to take  when to take this   metFORMIN 500 MG 24 hr tablet Commonly known as: GLUCOPHAGE-XR Take 2 tablets (1,000 mg total) by mouth 2 (two) times a day with meals for 30 days. What changed: Another medication with the same name was removed. Continue taking this medication, and follow the directions you see here.   ticagrelor 90 MG Tabs tablet Commonly known as: BRILINTA Take 1 tablet (90 mg total) by mouth 2 (two) times daily.       Follow-up Information    [facility] Follow up in 6 week(s).  Specialty: Neurology Contact information: [address] [place] [address] [phone]             No Known Allergies    CT Angio Head W or Wo Contrast  Result Date: 06/04/2020 CLINICAL DATA:  Neuro deficit, acute, stroke suspected; recent LVO. Additional provided: Right leg weakness. Fall today. EXAM: CT ANGIOGRAPHY HEAD AND NECK CT PERFUSION BRAIN TECHNIQUE: Multidetector CT imaging of the head and neck was performed using the standard protocol during bolus administration of intravenous contrast. Multiplanar CT image reconstructions and MIPs were obtained to evaluate the vascular anatomy. Carotid stenosis measurements (when applicable) are obtained utilizing NASCET criteria, using the distal internal carotid diameter as the denominator. Multiphase CT imaging of the brain was performed following IV bolus contrast injection. Subsequent parametric perfusion maps were calculated using RAPID software. CONTRAST:  181m OMNIPAQUE IOHEXOL 350 MG/ML SOLN COMPARISON:  Prior CT angiogram head/neck 05/31/2020. FINDINGS: CTA NECK FINDINGS Aortic arch: Aberrant right  subclavian artery. The visualized aortic arch is normal in caliber. No hemodynamically significant innominate or proximal subclavian artery stenosis. Right carotid system: CCA and ICA patent within the neck without stenosis. No significant atherosclerotic disease. Left carotid system: CCA and ICA patent within the neck without stenosis. No significant atherosclerotic disease. Vertebral arteries: Streak and beam hardening artifact arising from venous reflux limits evaluation of the V1 left vertebral artery. Within this limitation, the vertebral arteries are patent within the neck without hemodynamically significant stenosis. Skeleton: Cervical spondylosis. No acute bony abnormality or aggressive osseous lesion. Other neck: No neck mass or cervical lymphadenopathy. Upper chest: No consolidation within the imaged lung apices. Review of the MIP images confirms the above findings CTA HEAD FINDINGS Anterior circulation: The intracranial internal carotid arteries are patent. The M1 middle cerebral arteries are patent. Interval progression of a severe stenosis within a proximal left M2 MCA vessel (series 12, image 28). Multiple additional previously demonstrated severe stenoses within the bilateral M2 and M3 vessels. Hypoplastic right A1 segment. Unchanged appearance of a focal occlusion versus severe stenosis within the A2 left anterior cerebral artery (series 12, image 21). Progressive irregularity with multiple progressive severe stenoses within the left ACA vascular tree distal to this (series 12, image 21). No intracranial aneurysm is identified. Posterior circulation: The intracranial vertebral arteries are patent. The basilar artery is patent. The posterior cerebral arteries are patent. Redemonstrated mild/moderate stenosis within the proximal P2 left PCA (series 10, image 60). Posterior communicating arteries are hypoplastic or absent bilaterally. Venous sinuses: Within the limitations of contrast timing, no  convincing thrombus. Anatomic variants: As described Review of the MIP images confirms the above findings CT Brain Perfusion Findings: CBF (<30%) Volume: 043mPerfusion (Tmax>6.0s) volume: 7755mincluding areas within the left ACA vascular territory and right MCA vascular territory). Mismatch Volume: 77 mL Infarction Location:None identified These results were called by telephone at the time of interpretation on 06/04/2020 at 2:55 pm to provider Dr. LinCheral Markerho verbally acknowledged these results. IMPRESSION: CTA neck: 1. The common carotid, internal carotid and vertebral arteries are patent within the neck without hemodynamically significant stenosis. 2. Aberrant right subclavian artery. CTA head: 1. Focal occlusion versus severe stenosis within the A2 left anterior cerebral artery, similar in appearance as compared to the CTA of 05/31/2020. Progressive vessel irregularity with multiple progressive severe stenoses within the left ACA vascular tree distal to this. 2. Progressive severe focal stenosis within a proximal left M2 MCA vessel. 3. Multiple additional previously demonstrated severe stenoses within the bilateral  M2 and M3 MCA vessels. 4. Mild/moderate stenosis within the proximal P2 left PCA. 5. The above stenoses may be secondary to vasculitis and/or atherosclerotic disease. CT perfusion head: No core infarct is identified. Areas of hypoperfusion totaling 77 mL within the left ACA vascular territory and right MCA vascular territory (utilizing the Tmax>6 seconds threshold). Electronically Signed   By: Kellie Simmering DO   On: 06/04/2020 15:16   CT ANGIO HEAD NECK W WO CM  Result Date: 05/31/2020 CLINICAL DATA:  Focal neurological deficit CT, more than 6 hours, stroke suspected. EXAM: CT ANGIOGRAPHY HEAD AND NECK TECHNIQUE: Multidetector CT imaging of the head and neck was performed using the standard protocol during bolus administration of intravenous contrast. Multiplanar CT image reconstructions and MIPs  were obtained to evaluate the vascular anatomy. Carotid stenosis measurements (when applicable) are obtained utilizing NASCET criteria, using the distal internal carotid diameter as the denominator. CONTRAST:  56m OMNIPAQUE IOHEXOL 350 MG/ML SOLN COMPARISON:  MRI of the brain May 30, 2020 FINDINGS: CT HEAD FINDINGS Brain: Multiple hypodense foci are seen along the bilateral ACA and left MCA territory, corresponding to infarct seen on recent MRI of the brain. Multiple additional hypodense foci are seen in the bilateral cerebral hemispheres, corresponding to chronic infarcts. Area as of encephalomalacia and gliosis in the left parietooccipital region and inferior left cerebellar hemisphere. No hemorrhage, hydrocephalus, extra-axial collection or mass lesion. Vascular: No hyperdense vessel. Skull: Normal. Negative for fracture or focal lesion. Sinuses: Mucosal thickening throughout the paranasal sinuses. Orbits: No acute finding. 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 Image portion shows no evidence of aneurysm or dissection. No significant stenosis at the origin of the major arch vessels. Right carotid system: No evidence of dissection, stenosis or occlusion. Left carotid system: No evidence of dissection, stenosis or occlusion. Vertebral arteries: Left dominant. No evidence of dissection, stenosis (50% or greater) or occlusion. Skeleton: Degenerative changes of the cervical spine at C5-6 and C6-7. No acute or aggressive process identified. Other neck: Mildly prominent bilateral cervical lymph nodes, nonspecific. May be reactive. Prominent adenoid tissue. Upper chest: Negative. Review of the MIP images confirms the above findings CTA HEAD FINDINGS Anterior circulation: Intracranial bilateral internal carotid arteries abnormal  course and caliber. Bilateral A1 segments are patent noting a hypoplastic right A1/ACA. Interval development of a new focal occlusion versus high-grade stenosis at the mid A2 segment of the left anterior cerebral artery with normal distal opacification. Luminal irregularity is noted along the bilateral ACA vascular trees. Interval worsening of bilateral multifocal areas of stenosis of the bilateral MCA vascular tree preserving the bilateral M1 segments with severe stenosis at the proximal left M2 segment and persistent occlusion at the distal left M3/MCA superior division branch. Severe stenosis at the proximal right M2/MCA superior division branch and M3/MCA posterior division branches. Posterior circulation: The intracranial bilateral vertebral arteries and basilar artery are maintained. Luminal irregularity is seen along the bilateral posterior cerebral arteries with multifocal areas of mild stenosis. Venous sinuses: Poorly opacified. Review of the MIP images confirms the above findings IMPRESSION: 1. Multiple hypodense foci along the bilateral ACA and left MCA vascular trees correlating with acute/subacute infarcts described on recent MRI. 2. Remote small lacunar infarct in the bilateral cerebral and left cerebellar hemisphere, as described above. 3. No hemodynamically significant stenosis in the major neck  arteries. 4. Prominent luminal irregularity of the intracranial vasculature, may represent advanced atherosclerotic disease versus vasculitis. 5. New occlusion versus high-grade stenosis at the left A2/ACA segment. 6. Multifocal areas of high-grade stenosis the bilateral MCA vascular tree, as described above, with persistent occlusion of the distal left M3/MCA superior division branch and worsening stenosis of right M3/M branches. Electronically Signed   By: Pedro Earls M.D.   On: 05/31/2020 18:11   CT HEAD WO CONTRAST  Result Date: 06/03/2020 CLINICAL DATA:  Transient ischemic attack.  EXAM: CT HEAD WITHOUT CONTRAST TECHNIQUE: Contiguous axial images were obtained from the base of the skull through the vertex without intravenous contrast. COMPARISON:  May 30, 2020. FINDINGS: Brain: Mild chronic ischemic white matter disease is noted. Old left posterior parietal infarction is noted. Stable left frontal low density is noted consistent with acute to subacute infarction as noted on prior MRI. No hemorrhage is noted. No mass effect or midline shift is noted. Ventricular size is within normal limits. Old left cerebellar infarction is noted. Vascular: No hyperdense vessel or unexpected calcification. Skull: Normal. Negative for fracture or focal lesion. Sinuses/Orbits: Bilateral ethmoid and maxillary sinusitis is noted. Other: None. IMPRESSION: Stable left frontal low density is noted consistent with acute to subacute infarction as noted on recent MRI. Old left cerebellar and posterior parietal infarctions are noted. No significant changes noted compared to prior exam. Electronically Signed   By: Marijo Conception M.D.   On: 06/03/2020 16:16   CT HEAD WO CONTRAST  Result Date: 05/30/2020 CLINICAL DATA:  Mental status changes. EXAM: CT HEAD WITHOUT CONTRAST TECHNIQUE: Contiguous axial images were obtained from the base of the skull through the vertex without intravenous contrast. COMPARISON:  04/21/2020 FINDINGS: Brain: Periventricular white matter changes are consistent with small vessel disease. Remote infarcts involving the LEFT frontal lobe, LEFT posterior parietal lobe, LEFT cerebellum. Stable appearance of patchy low-attenuation in the RIGHT corona radiata. There is no intra or extra-axial fluid collection or mass lesion. The basilar cisterns and ventricles have a normal appearance. There is no CT evidence for acute infarction or hemorrhage. Vascular: No hyperdense vessel or unexpected calcification. Skull: Normal. Negative for fracture or focal lesion. Sinuses/Orbits: Moderate mucosal thickening  of the paranasal sinuses. No air-fluid levels. Other: None. IMPRESSION: 1. Stable appearance of chronic LEFT infarcts. 2. Periventricular white matter changes are stable. 3.  No evidence for acute intracranial abnormality. 4. Chronic sinus changes. Electronically Signed   By: Nolon Nations M.D.   On: 05/30/2020 14:34   CT ANGIO NECK W OR WO CONTRAST  Result Date: 06/15/2020 CLINICAL DATA:  Right-sided deficits and aphasia EXAM: CT ANGIOGRAPHY HEAD AND NECK TECHNIQUE: Multidetector CT imaging of the head and neck was performed using the standard protocol during bolus administration of intravenous contrast. Multiplanar CT image reconstructions and MIPs were obtained to evaluate the vascular anatomy. Carotid stenosis measurements (when applicable) are obtained utilizing NASCET criteria, using the distal internal carotid diameter as the denominator. CONTRAST:  10m OMNIPAQUE IOHEXOL 350 MG/ML SOLN COMPARISON:  CTA head neck 05/31/2020 FINDINGS: CT HEAD FINDINGS Brain: Old infarcts of the left cerebellum and posterior left parietal lobe. No acute hemorrhage. The size and configuration of the ventricles and extra-axial CSF spaces are normal. There is hypoattenuation of the periventricular white matter, most commonly indicating chronic ischemic microangiopathy. Skull: The visualized skull base, calvarium and extracranial soft tissues are normal. Sinuses/Orbits: No fluid levels or advanced mucosal thickening of the visualized paranasal sinuses. No mastoid or middle  ear effusion. The orbits are normal. CTA NECK FINDINGS SKELETON: There is no bony spinal canal stenosis. No lytic or blastic lesion. OTHER NECK: Normal pharynx, larynx and major salivary glands. No cervical lymphadenopathy. Unremarkable thyroid gland. UPPER CHEST: No pneumothorax or pleural effusion. No nodules or masses. AORTIC ARCH: There is no calcific atherosclerosis of the aortic arch. There is no aneurysm, dissection or hemodynamically significant  stenosis of the visualized portion of the aorta. Aberrant right subclavian artery. The visualized proximal subclavian arteries are widely patent. RIGHT CAROTID SYSTEM: Normal without aneurysm, dissection or stenosis. LEFT CAROTID SYSTEM: Normal without aneurysm, dissection or stenosis. VERTEBRAL ARTERIES: Left dominant configuration. Both origins are clearly patent. There is no dissection, occlusion or flow-limiting stenosis to the skull base (V1-V3 segments). CTA HEAD FINDINGS POSTERIOR CIRCULATION: --Vertebral arteries: Normal V4 segments. --Inferior cerebellar arteries: Normal. --Basilar artery: Normal. --Superior cerebellar arteries: Normal. --Posterior cerebral arteries (PCA): Mild bilateral multifocal stenoses are unchanged. ANTERIOR CIRCULATION: --Intracranial internal carotid arteries: Normal. --Anterior cerebral arteries (ACA): Unchanged area of severe stenosis of the left A2 segment --Middle cerebral arteries (MCA): There are multifocal severe bilateral stenoses, unchanged. Stenosis are most severe at the distal M1 and proximal M2 segments. VENOUS SINUSES: As permitted by contrast timing, patent. ANATOMIC VARIANTS: None Review of the MIP images confirms the above findings. IMPRESSION: 1. No emergent large vessel occlusion. 2. Unchanged multifocal severe intracranial stenoses, including severe stenosis of the left anterior cerebral artery A2 segment and severe bilateral MCA distal M1 and proximal M2 stenoses. 3. 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 4. Aberrant right subclavian artery. Electronically Signed   By: Ulyses Jarred M.D.   On: 06/15/2020 21:42   CT Angio Neck W and/or Wo Contrast  Result Date: 06/04/2020 CLINICAL DATA:  Neuro deficit, acute, stroke suspected; recent LVO. Additional provided: Right leg weakness. Fall today. EXAM: CT ANGIOGRAPHY HEAD AND NECK CT PERFUSION BRAIN TECHNIQUE: Multidetector CT imaging of the head and neck was performed using the standard protocol  during bolus administration of intravenous contrast. Multiplanar CT image reconstructions and MIPs were obtained to evaluate the vascular anatomy. Carotid stenosis measurements (when applicable) are obtained utilizing NASCET criteria, using the distal internal carotid diameter as the denominator. Multiphase CT imaging of the brain was performed following IV bolus contrast injection. Subsequent parametric perfusion maps were calculated using RAPID software. CONTRAST:  167m OMNIPAQUE IOHEXOL 350 MG/ML SOLN COMPARISON:  Prior CT angiogram head/neck 05/31/2020. FINDINGS: CTA NECK FINDINGS Aortic arch: Aberrant right subclavian artery. The visualized aortic arch is normal in caliber. No hemodynamically significant innominate or proximal subclavian artery stenosis. Right carotid system: CCA and ICA patent within the neck without stenosis. No significant atherosclerotic disease. Left carotid system: CCA and ICA patent within the neck without stenosis. No significant atherosclerotic disease. Vertebral arteries: Streak and beam hardening artifact arising from venous reflux limits evaluation of the V1 left vertebral artery. Within this limitation, the vertebral arteries are patent within the neck without hemodynamically significant stenosis. Skeleton: Cervical spondylosis. No acute bony abnormality or aggressive osseous lesion. Other neck: No neck mass or cervical lymphadenopathy. Upper chest: No consolidation within the imaged lung apices. Review of the MIP images confirms the above findings CTA HEAD FINDINGS Anterior circulation: The intracranial internal carotid arteries are patent. The M1 middle cerebral arteries are patent. Interval progression of a severe stenosis within a proximal left M2 MCA vessel (series 12, image 28). Multiple additional previously demonstrated severe stenoses within the bilateral M2 and M3 vessels. Hypoplastic right A1 segment.  Unchanged appearance of a focal occlusion versus severe stenosis  within the A2 left anterior cerebral artery (series 12, image 21). Progressive irregularity with multiple progressive severe stenoses within the left ACA vascular tree distal to this (series 12, image 21). No intracranial aneurysm is identified. Posterior circulation: The intracranial vertebral arteries are patent. The basilar artery is patent. The posterior cerebral arteries are patent. Redemonstrated mild/moderate stenosis within the proximal P2 left PCA (series 10, image 60). Posterior communicating arteries are hypoplastic or absent bilaterally. Venous sinuses: Within the limitations of contrast timing, no convincing thrombus. Anatomic variants: As described Review of the MIP images confirms the above findings CT Brain Perfusion Findings: CBF (<30%) Volume: 26m Perfusion (Tmax>6.0s) volume: 751m(including areas within the left ACA vascular territory and right MCA vascular territory). Mismatch Volume: 77 mL Infarction Location:None identified These results were called by telephone at the time of interpretation on 06/04/2020 at 2:55 pm to provider Dr. LiCheral MarkerWho verbally acknowledged these results. IMPRESSION: CTA neck: 1. The common carotid, internal carotid and vertebral arteries are patent within the neck without hemodynamically significant stenosis. 2. Aberrant right subclavian artery. CTA head: 1. Focal occlusion versus severe stenosis within the A2 left anterior cerebral artery, similar in appearance as compared to the CTA of 05/31/2020. Progressive vessel irregularity with multiple progressive severe stenoses within the left ACA vascular tree distal to this. 2. Progressive severe focal stenosis within a proximal left M2 MCA vessel. 3. Multiple additional previously demonstrated severe stenoses within the bilateral M2 and M3 MCA vessels. 4. Mild/moderate stenosis within the proximal P2 left PCA. 5. The above stenoses may be secondary to vasculitis and/or atherosclerotic disease. CT perfusion head: No core  infarct is identified. Areas of hypoperfusion totaling 77 mL within the left ACA vascular territory and right MCA vascular territory (utilizing the Tmax>6 seconds threshold). Electronically Signed   By: KyKellie SimmeringO   On: 06/04/2020 15:16   MR BRAIN WO CONTRAST  Result Date: 06/16/2020 CLINICAL DATA:  [age]ear old male with difficulty speaking. Progressive multifocal cerebral infarcts since 04/20/2020. EXAM: MRI HEAD WITHOUT CONTRAST TECHNIQUE: Multiplanar, multiecho pulse sequences of the brain and surrounding structures were obtained without intravenous contrast. COMPARISON:  CTA head and neck 06/15/2020, brain MRI 06/04/2020 and earlier. FINDINGS: Brain: Since 06/04/2020, small new cortical infarcts of the left superior frontal gyrus pre motor area (series 5, image 93), increased infarct with restricted diffusion in the left cingulate gyrus (series 5, image 85)., progressed and more confluent right periatrial white matter infarct on image 81, and small new posterior right corona radiata infarct nearby on image 84. No significant mass effect. No malignant hemorrhagic transformation. Other widely scattered ischemic foci with abnormal diffusion in the MCA and left ACA territories were pre-existing. Deep gray nuclei, PCA territories, and posterior fossa relatively spared as before; small left cerebellar infarct seen last month has faded on diffusion series 5, image 67. Superimposed subacute or chronic infarcts in the left parietal lobe and left PICA territory with developing encephalomalacia. Scattered hemosiderin in both hemispheres appears stable on SWI. No midline shift, mass effect, evidence of mass lesion, ventriculomegaly. Cervicomedullary junction and pituitary are within normal limits. Vascular: Major intracranial vascular flow voids are stable with intracranial artery tortuosity. Skull and upper cervical spine: Stable. Visualized bone marrow signal is within normal limits. Sinuses/Orbits: Largely  resolved paranasal sinus disease since last month. Orbits remain negative. Other: Trace right mastoid fluid is decreased. Grossly normal visible internal auditory structures. Visible scalp and face appear negative. IMPRESSION: 1.  Once again several new or increased acute infarcts are demonstrated in both cerebral hemispheres (today the bilateral MCA and left ACA territories) since the recent MRI 06/04/2020. No malignant hemorrhagic transformation or mass effect. 2. Stable or expected evolution of ischemia elsewhere, including developing encephalomalacia in the left parietal lobe and the left cerebellum (PICA) territories. 3. Improved paranasal sinus aeration since last month. Electronically Signed   By: Genevie Ann M.D.   On: 06/16/2020 07:09   MR Brain Wo Contrast (neuro protocol)  Result Date: 05/30/2020 CLINICAL DATA:  Mental status change, unknown cause. EXAM: MRI HEAD WITHOUT CONTRAST TECHNIQUE: Multiplanar, multiecho pulse sequences of the brain and surrounding structures were obtained without intravenous contrast. COMPARISON:  Noncontrast head CT performed earlier today 05/30/2020. CT angiogram head/neck 04/21/2020. Brain MRI 04/20/2020. FINDINGS: Brain: Mild cerebral and cerebellar atrophy. Redemonstrated patchy cortical/subcortical infarcts within the mid left frontal lobe/frontal operculum, now subacute. However, new from the prior brain MRI of 04/20/2020, there are new acute infarcts within the cortical and subcortical bilateral frontal lobes, and within the left callosal body/genu (bilateral MCA and ACA territories). The largest acute infarct is present within the left callosal body/genu and measures 2 cm. Redemonstrated chronic cortical/subcortical infarct within the left parietal lobe. Redemonstrated chronic infarct within the left cerebellar hemisphere. Background moderate severe multifocal T2/FLAIR hyperintensity within the cerebral white matter, nonspecific but compatible with chronic small vessel  ischemic disease. This includes chronic lacunar infarcts within the bilateral centrum semiovale and corona radiata. Redemonstrated chronic microhemorrhages within the bilateral parietal lobes. No evidence of intracranial mass. No extra-axial fluid collection. No midline shift. Vascular: Expected proximal arterial flow voids. Skull and upper cervical spine: No focal marrow lesion. Sinuses/Orbits: Visualized orbits show no acute finding. Mild mucosal thickening within the bilateral frontal sinuses. Moderate bilateral ethmoid sinus mucosal thickening. Mild bilateral maxillary sinus mucosal thickening. IMPRESSION: Redemonstrated patchy cortical/subcortical infarcts within the mid left frontal lobe/frontal operculum, now subacute. New from the prior brain MRI of 04/20/2020, there are small acute infarcts within the cortical and subcortical frontal lobes bilaterally, and within the left callosal body/genu (bilateral MCA and ACA territories). The largest acute infarct is present within the left callosal body/genu, measuring 2 cm. 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 Stable background severe cerebral white matter chronic small vessel ischemic disease. Stable mild generalized parenchymal atrophy. Paranasal sinus disease, as described. Electronically Signed   By: Kellie Simmering DO   On: 05/30/2020 16:10   MR BRAIN W WO CONTRAST  Result Date: 06/04/2020 CLINICAL DATA:  Stroke EXAM: MRI HEAD WITHOUT AND WITH CONTRAST TECHNIQUE: Multiplanar, multiecho pulse sequences of the brain and surrounding structures were obtained without and with intravenous contrast. CONTRAST:  65m GADAVIST GADOBUTROL 1 MMOL/ML IV SOLN COMPARISON:  CT angio head and neck 06/04/2020 FINDINGS: Brain: Bilateral recent infarcts in the anterior and middle cerebral artery territories similar to the recent MRI. Small new areas of restricted diffusion in the  right frontal cortex and in the right parietal white matter and cortex compatible with interval infarct since 05/30/2020. In addition, there is a new area of restricted diffusion in the left anterior cerebellum. Following contrast infusion, there is mild enhancement of several subacute infarcts and occluding the left anterior genu of the corpus callosum, left corona radiata, and left parietal lobe compatible with subacute infarct. Ventricle size normal. 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 This shows a slight amount of enhancement however there is volume  loss in the majority of this appears to be a chronic infarct. Small area of chronic hemorrhagic infarct in the right temporoparietal lobe. Moderate chronic microvascular ischemic change in the white matter. Chronic left cerebellar infarct. Negative for mass lesion. Vascular: Normal arterial flow voids. Skull and upper cervical spine: Negative Sinuses/Orbits: Extensive mucosal edema throughout the paranasal sinuses. Air-fluid level right maxillary sinus. Negative orbit Other: None IMPRESSION: Multiple areas of subacute infarct in the anterior and middle cerebral arteries bilaterally as noted on the recent MRI of 05/30/2020 Several small areas of acute infarct have developed since the prior MRI including the right frontal cortex, right parietal white matter and cortex, and the left cerebellum. Electronically Signed   By: Franchot Gallo M.D.   On: 06/04/2020 17:55   CT CEREBRAL PERFUSION W CONTRAST  Result Date: 06/04/2020 CLINICAL DATA:  Neuro deficit, acute, stroke suspected; recent LVO. Additional provided: Right leg weakness. Fall today. EXAM: CT ANGIOGRAPHY HEAD AND NECK CT PERFUSION BRAIN TECHNIQUE: Multidetector CT imaging of the head and neck was performed using the standard protocol during bolus administration of intravenous contrast. Multiplanar CT image reconstructions and MIPs were obtained to evaluate the vascular anatomy. Carotid  stenosis measurements (when applicable) are obtained utilizing NASCET criteria, using the distal internal carotid diameter as the denominator. Multiphase CT imaging of the brain was performed following IV bolus contrast injection. Subsequent parametric perfusion maps were calculated using RAPID software. CONTRAST:  147m OMNIPAQUE IOHEXOL 350 MG/ML SOLN COMPARISON:  Prior CT angiogram head/neck 05/31/2020. FINDINGS: CTA NECK FINDINGS Aortic arch: Aberrant right subclavian artery. The visualized aortic arch is normal in caliber. No hemodynamically significant innominate or proximal subclavian artery stenosis. Right carotid system: CCA and ICA patent within the neck without stenosis. No significant atherosclerotic disease. Left carotid system: CCA and ICA patent within the neck without stenosis. No significant atherosclerotic disease. Vertebral arteries: Streak and beam hardening artifact arising from venous reflux limits evaluation of the V1 left vertebral artery. Within this limitation, the vertebral arteries are patent within the neck without hemodynamically significant stenosis. Skeleton: Cervical spondylosis. No acute bony abnormality or aggressive osseous lesion. Other neck: No neck mass or cervical lymphadenopathy. Upper chest: No consolidation within the imaged lung apices. Review of the MIP images confirms the above findings CTA HEAD FINDINGS Anterior circulation: The intracranial internal carotid arteries are patent. The M1 middle cerebral arteries are patent. Interval progression of a severe stenosis within a proximal left M2 MCA vessel (series 12, image 28). Multiple additional previously demonstrated severe stenoses within the bilateral M2 and M3 vessels. Hypoplastic right A1 segment. Unchanged appearance of a focal occlusion versus severe stenosis within the A2 left anterior cerebral artery (series 12, image 21). Progressive irregularity with multiple progressive severe stenoses within the left ACA  vascular tree distal to this (series 12, image 21). No intracranial aneurysm is identified. Posterior circulation: The intracranial vertebral arteries are patent. The basilar artery is patent. The posterior cerebral arteries are patent. Redemonstrated mild/moderate stenosis within the proximal P2 left PCA (series 10, image 60). Posterior communicating arteries are hypoplastic or absent bilaterally. Venous sinuses: Within the limitations of contrast timing, no convincing thrombus. Anatomic variants: As described Review of the MIP images confirms the above findings CT Brain Perfusion Findings: CBF (<30%) Volume: 062mPerfusion (Tmax>6.0s) volume: 7784mincluding areas within the left ACA vascular territory and right MCA vascular territory). Mismatch Volume: 77 mL Infarction Location:None identified These results were called by telephone at the time of interpretation on 06/04/2020 at 2:55 pm  to provider Dr. Cheral Marker, Who verbally acknowledged these results. IMPRESSION: CTA neck: 1. The common carotid, internal carotid and vertebral arteries are patent within the neck without hemodynamically significant stenosis. 2. Aberrant right subclavian artery. CTA head: 1. Focal occlusion versus severe stenosis within the A2 left anterior cerebral artery, similar in appearance as compared to the CTA of 05/31/2020. Progressive vessel irregularity with multiple progressive severe stenoses within the left ACA vascular tree distal to this. 2. Progressive severe focal stenosis within a proximal left M2 MCA vessel. 3. Multiple additional previously demonstrated severe stenoses within the bilateral M2 and M3 MCA vessels. 4. Mild/moderate stenosis within the proximal P2 left PCA. 5. The above stenoses may be secondary to vasculitis and/or atherosclerotic disease. CT perfusion head: No core infarct is identified. Areas of hypoperfusion totaling 77 mL within the left ACA vascular territory and right MCA vascular territory (utilizing the Tmax>6  seconds threshold). Electronically Signed   By: Kellie Simmering DO   On: 06/04/2020 15:16   EEG adult  Result Date: 06/17/2020 Lora Havens, MD     06/17/2020 10:22 AM Patient Name: Nathaniel Spears MRN: [id number] Epilepsy Attending: Lora Havens Referring Physician/Provider: Charlene Brooke, NP Date: 06/18/2018 Duration: 26.40 minutes Patient history: 45 year old male with multiple strokes and seizure-like activity.  EEG to evaluate for seizures. Level of alertness: Awake AEDs during EEG study: Keppra Technical aspects: This EEG study was done with scalp electrodes positioned according to the 10-20 International system of electrode placement. Electrical activity was acquired at a sampling rate of _0  and reviewed with a high frequency filter of _1  and a low frequency filter of _2 . EEG data were recorded continuously and digitally stored. Description: No clear posterior dominant rhythm was seen. EEG showed continuous generalized 3 to 6 Hz theta-delta slowing as well as intermittent generalized and lateralized right hemisphere rhythmic 2 to 3 Hz delta slowing.  Hyperventilation and photic stimulation were not performed.   ABNORMALITY - Continuous slow, generalized -Intermittent rhythmic delta slowing, generalized and lateralized right hemisphere IMPRESSION: This study is suggestive of cortical dysfunction in right hemisphere likely secondary to underlying stroke. Additionally, there is moderate diffuse encephalopathy, non specific etiology. No seizures or epileptiform discharges were seen throughout the recording. Priyanka Barbra Sarks   Overnight EEG with video  Result Date: 06/18/2020 Lora Havens, MD     06/19/2020  8:54 AM Patient Name: Nathaniel Spears MRN: [id number] Epilepsy Attending: Lora Havens Referring Physician/Provider: Charlene Brooke, NP Duration: 06/17/2020 7169 to 06/18/2020 6789  Patient history: 45 year old male with multiple strokes and seizure-like activity.  EEG to evaluate  for seizures.  Level of alertness: Awake, asleep  AEDs during EEG study: Keppra  Technical aspects: This EEG study was done with scalp electrodes positioned according to the 10-20 International system of electrode placement. Electrical activity was acquired at a sampling rate of _3  and reviewed with a high frequency filter of _4  and a low frequency filter of _5 . EEG data were recorded continuously and digitally stored.  Description: No clear posterior dominant rhythm was seen. EEG showed continuous generalized 3 to 6 Hz theta-delta slowing as well as intermittent generalized and lateralized right hemisphere rhythmic 2 to 3 Hz delta slowing.  Hyperventilation and photic stimulation were not performed.    ABNORMALITY - Continuous slow, generalized -Intermittent rhythmic delta slowing, generalized and lateralized right hemisphere  IMPRESSION: This study  showed lateralized rhythmic delta activity which is on the ictal-interictal continuum with low potential for seizures and is suggestive of cortical  dysfunction in right hemisphere likely secondary to underlying stroke. Additionally, there is moderate diffuse encephalopathy, non specific etiology. No seizures or epileptiform discharges were seen throughout the recording.  Lora Havens   ECHO TEE  Result Date: 06/01/2020    TRANSESOPHOGEAL ECHO REPORT   Patient Name:   Nathaniel Spears Date of Exam: 06/01/2020 Medical Rec #:  [id number]      Height:       73.0 in Accession #:    [id number]     Weight:       230.0 lb Date of Birth:  08-Dec-1975       BSA:          2.283 m Patient Age:    45 years       BP:           185/111 mmHg Patient Gender: M              HR:           101 bpm. Exam Location:  Inpatient Procedure: Transesophageal Echo, Color Doppler, Cardiac Doppler and Saline            Contrast Bubble Study Indications:     Stroke  History:         Patient has prior history of Echocardiogram examinations, most                  recent 04/21/2020. Risk  Factors:Hypertension.  Sonographer:     Bernadene Person RDCS Referring Phys:  [id number] Leanor Kail Diagnosing Phys: Oswaldo Milian MD PROCEDURE: After discussion of the risks and benefits of a TEE, an informed consent was obtained from the patient. The transesophogeal probe was passed without difficulty through the esophogus of the patient. Local oropharyngeal anesthetic was provided with Cetacaine. Sedation performed by different physician. The patient was monitored while under deep sedation. Anesthestetic sedation was provided intravenously by Anesthesiology: 477.75m of Propofol. The patient developed Oropharyngeal bleeding during  the procedure but no further bleeding was noted during recovery. IMPRESSIONS  1. Left ventricular ejection fraction, by estimation, is 60 to 65%. The left ventricle has normal function. The left ventricle has no regional wall motion abnormalities. There is severe left ventricular hypertrophy.  2. Right ventricular systolic function is normal. The right ventricular size is normal.  3. No left atrial/left atrial appendage thrombus was detected.  4. The mitral valve is normal in structure. Trivial mitral valve regurgitation.  5. The aortic valve is tricuspid. Aortic valve regurgitation is trivial.  6. Agitated saline contrast bubble study was negative, with no evidence of any interatrial shunt. Conclusion(s)/Recommendation(s): No LA/LAA thrombus identified. Negative bubble study for interatrial shunt. No intracardiac source of embolism detected on this on this transesophageal echocardiogram. FINDINGS  Left Ventricle: Left ventricular ejection fraction, by estimation, is 60 to 65%. The left ventricle has normal function. The left ventricle has no regional wall motion abnormalities. The left ventricular internal cavity size was normal in size. There is  severe left ventricular hypertrophy. Right Ventricle: The right ventricular size is normal. No increase in right ventricular  wall thickness. Right ventricular systolic function is normal. Left Atrium: Left atrial size was normal in size. No left atrial/left atrial appendage thrombus was detected. Right Atrium: Right atrial size was normal in size. Pericardium: There is no evidence of pericardial effusion. Mitral Valve: The mitral valve is normal in structure. Trivial mitral valve regurgitation. Tricuspid Valve: The tricuspid valve is normal in structure. Tricuspid valve regurgitation is trivial. Aortic Valve: The  aortic valve is tricuspid. Aortic valve regurgitation is trivial. Pulmonic Valve: The pulmonic valve was grossly normal. Pulmonic valve regurgitation is not visualized. Aorta: The aortic root is normal in size and structure. IAS/Shunts: No atrial level shunt detected by color flow Doppler. Agitated saline contrast was given intravenously to evaluate for intracardiac shunting. Agitated saline contrast bubble study was negative, with no evidence of any interatrial shunt. Oswaldo Milian MD Electronically signed by Oswaldo Milian MD Signature Date/Time: 06/01/2020/6:21:55 PM    Final    CT HEAD CODE STROKE WO CONTRAST  Result Date: 06/04/2020 CLINICAL DATA:  Code stroke. Neuro deficit, acute, stroke suspected. Additional history provided: Last known normal 1:15 p.m., weakness, fall in part today, 3 recent strokes. EXAM: CT HEAD WITHOUT CONTRAST TECHNIQUE: Contiguous axial images were obtained from the base of the skull through the vertex without intravenous contrast. COMPARISON:  CT angiogram head/neck 05/31/2020. Brain MRI 05/30/2020. FINDINGS: Brain: Mild cerebral and cerebellar atrophy. No interval acute demarcated cortical infarct is identified. No evidence of acute intracranial hemorrhage. Redemonstrated subacute infarction changes within the anterior left frontal lobe white matter and left callosal body/genu. Additional known subacute infarcts within the bilateral cerebral hemispheres were better appreciated on  the brain MRI of 05/30/2020. Redemonstrated chronic cortical/subcortical infarct within the left parietal lobe. Stable background advanced chronic small vessel ischemic disease within the cerebral white matter, including multiple chronic small-vessel infarcts within the bilateral cerebral white matter. Redemonstrated chronic infarct within the left cerebellum. No extra-axial fluid collection. No evidence of intracranial mass. No midline shift. Vascular: No hyperdense vessel. Skull: Normal. Negative for fracture or focal lesion. Sinuses/Orbits: Visualized orbits show no acute finding. Pansinusitis. Most notably, there is severe mucosal thickening within the left frontal sinus, within the bilateral ethmoid air cells and within the bilateral maxillary sinuses. Superimposed scattered small volume frothy secretions. ASPECTS [facility] Stroke Program Early CT Score) - Ganglionic level infarction (caudate, lentiform nuclei, internal capsule, insula, M1-M3 cortex): 7 - Supraganglionic infarction (M4-M6 cortex): 3 Total score (0-10 with 10 being normal): 10 (when discounting subacute and chronic infarcts). These results were called by telephone at the time of interpretation on 06/04/2020 at 2:08 pm to provider DAVID YAO , who verbally acknowledged these results. IMPRESSION: Comparison is made to the prior CT head of 05/31/2020. No appreciable interval acute infarct. Redemonstrated subacute infarcts within the anterior left frontal lobe white matter and left callosal body/genu. Additional known subacute infarcts within the bilateral cerebral hemispheres were better appreciated on the brain MRI of 05/30/2020. Redemonstrated chronic cortical/subcortical infarct within the left parietal lobe. Stable background advanced cerebral white matter chronic small vessel ischemic disease. Redemonstrated chronic left cerebellar infarct. Stable mild generalized parenchymal atrophy. Pansinusitis. Electronically Signed   By: Kellie Simmering DO    On: 06/04/2020 14:09   CT ANGIO HEAD CODE STROKE  Result Date: 06/15/2020 CLINICAL DATA:  Right-sided deficits and aphasia EXAM: CT ANGIOGRAPHY HEAD AND NECK TECHNIQUE: Multidetector CT imaging of the head and neck was performed using the standard protocol during bolus administration of intravenous contrast. Multiplanar CT image reconstructions and MIPs were obtained to evaluate the vascular anatomy. Carotid stenosis measurements (when applicable) are obtained utilizing NASCET criteria, using the distal internal carotid diameter as the denominator. CONTRAST:  53m OMNIPAQUE IOHEXOL 350 MG/ML SOLN COMPARISON:  CTA head neck 05/31/2020 FINDINGS: CT HEAD FINDINGS Brain: Old infarcts of the left cerebellum and posterior left parietal lobe. No acute hemorrhage. The size and configuration of the ventricles and extra-axial CSF spaces are normal. There is  hypoattenuation of the periventricular white matter, most commonly indicating chronic ischemic microangiopathy. Skull: The visualized skull base, calvarium and extracranial soft tissues are normal. Sinuses/Orbits: No fluid levels or advanced mucosal thickening of the visualized paranasal sinuses. No mastoid or middle ear effusion. The orbits are normal. CTA NECK FINDINGS SKELETON: There is no bony spinal canal stenosis. No lytic or blastic lesion. OTHER NECK: Normal pharynx, larynx and major salivary glands. No cervical lymphadenopathy. Unremarkable thyroid gland. UPPER CHEST: No pneumothorax or pleural effusion. No nodules or masses. AORTIC ARCH: There is no calcific atherosclerosis of the aortic arch. There is no aneurysm, dissection or hemodynamically significant stenosis of the visualized portion of the aorta. Aberrant right subclavian artery. The visualized proximal subclavian arteries are widely patent. RIGHT CAROTID SYSTEM: Normal without aneurysm, dissection or stenosis. LEFT CAROTID SYSTEM: Normal without aneurysm, dissection or stenosis. VERTEBRAL ARTERIES:  Left dominant configuration. Both origins are clearly patent. There is no dissection, occlusion or flow-limiting stenosis to the skull base (V1-V3 segments). CTA HEAD FINDINGS POSTERIOR CIRCULATION: --Vertebral arteries: Normal V4 segments. --Inferior cerebellar arteries: Normal. --Basilar artery: Normal. --Superior cerebellar arteries: Normal. --Posterior cerebral arteries (PCA): Mild bilateral multifocal stenoses are unchanged. ANTERIOR CIRCULATION: --Intracranial internal carotid arteries: Normal. --Anterior cerebral arteries (ACA): Unchanged area of severe stenosis of the left A2 segment --Middle cerebral arteries (MCA): There are multifocal severe bilateral stenoses, unchanged. Stenosis are most severe at the distal M1 and proximal M2 segments. VENOUS SINUSES: As permitted by contrast timing, patent. ANATOMIC VARIANTS: None Review of the MIP images confirms the above findings. IMPRESSION: 1. No emergent large vessel occlusion. 2. Unchanged multifocal severe intracranial stenoses, including severe stenosis of the left anterior cerebral artery A2 segment and severe bilateral MCA distal M1 and proximal M2 stenoses. 3. 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 4. Aberrant right subclavian artery. Electronically Signed   By: Ulyses Jarred M.D.   On: 06/15/2020 21:42   IR ANGIO INTRA EXTRACRAN SEL COM CAROTID INNOMINATE BILAT MOD SED  Result Date: 06/07/2020 CLINICAL DATA:  Recurred ischemic strokes involving the anterior circulation. Abnormal CT angiogram of the head and neck. EXAM: BILATERAL COMMON CAROTID AND INNOMINATE ANGIOGRAPHY COMPARISON:  MRI MRA of the brain April 20, 2020, and CT angiogram of the head and neck of April 21, 2020. MEDICATIONS: Heparin 1000 units IV. None antibiotic was administered within 1 hour of the procedure. ANESTHESIA/SEDATION: Versed 1 mg IV; Fentanyl 25 mcg IV Moderate Sedation Time:  46 minutes The patient was continuously monitored during the procedure by the  interventional radiology nurse under my direct supervision. CONTRAST:  Isovue 300 approximately 65 mL. FLUOROSCOPY TIME:  Fluoroscopy Time: 20 minutes 6 seconds (1115 mGy). COMPLICATIONS: None immediate. TECHNIQUE: Informed written consent was obtained from the patient after a thorough discussion of the procedural risks, benefits and alternatives. All questions were addressed. Maximal Sterile Barrier Technique was utilized including caps, mask, sterile gowns, sterile gloves, sterile drape, hand hygiene and skin antiseptic. A timeout was performed prior to the initiation of the procedure. The right groin was prepped and draped in the usual sterile fashion. Thereafter using modified Seldinger technique, transfemoral access into the right common femoral artery was obtained without difficulty. Over a 0.035 inch guidewire, a 5 French Pinnacle sheath was inserted. Through this, and also over 0.035 inch guidewire, a 5 [place] JB 1 catheter was advanced to the aortic arch region and selectively positioned in the right common carotid artery, right subclavian artery, the left common carotid artery and the left vertebral artery.  FINDINGS: The right common carotid arteriogram demonstrates the right external carotid artery and its major branches to be widely patent. The right internal carotid artery at the bulb to the cranial skull base opacifies widely. The petrous, cavernous and supraclinoid segments are widely patent. The right middle cerebral artery M1 segment is widely patent. There is a mild to moderate stenosis at the distal right M1 segment. The right middle cerebral artery superior division demonstrates significant proximal stenosis, as does the parietal branch from the inferior division. Distally the vessel is seen to opacify into the capillary and venous phases. The right anterior cerebral artery has high-grade tandem stenosis of the distal A1 segment, and the right A1 A2 junction. The left vertebral artery origin is  widely patent. The vessel opacify to the cranial skull base. Patency is seen of the left vertebrobasilar junction and the left posterior-inferior cerebellar artery. The left anterior-inferior cerebellar artery/left posterior-inferior cerebellar artery complex is seen. The basilar artery, the posterior cerebral arteries, the superior cerebellar arteries and the anterior-inferior cerebellar arteries opacify into the capillary and venous phases. Unopacified blood is seen in the basilar artery from the contralateral vertebral artery. There is approximately 60% stenosis of the left P1 distally. The left common carotid arteriogram demonstrates the left external carotid artery and its major branches to be widely patent. The left internal carotid artery at the bulb to the cranial skull base is widely patent. The petrous, the cavernous and the supraclinoid segments are widely patent. The left middle cerebral artery demonstrates a mild tapered narrowing of the distal M1 segment. The inferior division demonstrates a moderate stenosis proximally. The superior division demonstrates occlusion in the distal M2 segment. The left anterior cerebral artery A2 segment demonstrates significant segmental stenosis of the proximal A2 segment. More distally focal areas of caliber irregularity with narrowing are seen involving the callosal marginal, and the pericallosal branches. The right subclavian artery is the last branch arising from the aortic arch. This projects medially centrally and then towards the right subclavian region, consistent with aberrant origin of the right subclavian artery, a developmental variation. The right vertebral artery origin from the aberrant right subclavian artery appears widely patent proximally to the cranial skull base. Patency is seen to the right vertebrobasilar junction with patency seen of the right posterior-inferior cerebellar artery. IMPRESSION: Moderate stenosis of the right middle cerebral artery  distal M1 segment. Severe segmental stenosis of the superior division of the right middle cerebral artery proximally, and of a parietal branch of the inferior division of the right middle cerebral artery. Severe tandem segmental stenosis of the right anterior cerebral artery of the distal A1 segment and at the A1 A2 junction. Moderate stenosis of the inferior division of the left middle cerebral artery proximally. The superior division demonstrates occlusion in the distal M2 region. Left anterior cerebral artery significant segmental stenosis of the proximal A2 segment. Focal areas of segmental stenosis of the proximal left pericallosal, and the callosal marginal arteries. Approximately 60% stenosis of the left posterior cerebral artery distal P1 segment. PLAN: Findings discussed with the patient, and with the referring MD. Electronically Signed   By: Luanne Bras M.D.   On: 06/02/2020 17:04   IR ANGIO VERTEBRAL SEL SUBCLAVIAN INNOMINATE BILAT MOD SED  Result Date: 06/07/2020 CLINICAL DATA:  Recurred ischemic strokes involving the anterior circulation. Abnormal CT angiogram of the head and neck. EXAM: BILATERAL COMMON CAROTID AND INNOMINATE ANGIOGRAPHY COMPARISON:  MRI MRA of the brain April 20, 2020, and CT angiogram of the  head and neck of April 21, 2020. MEDICATIONS: Heparin 1000 units IV. None antibiotic was administered within 1 hour of the procedure. ANESTHESIA/SEDATION: Versed 1 mg IV; Fentanyl 25 mcg IV Moderate Sedation Time:  46 minutes The patient was continuously monitored during the procedure by the interventional radiology nurse under my direct supervision. CONTRAST:  Isovue 300 approximately 65 mL. FLUOROSCOPY TIME:  Fluoroscopy Time: 20 minutes 6 seconds (1115 mGy). COMPLICATIONS: None immediate. TECHNIQUE: Informed written consent was obtained from the patient after a thorough discussion of the procedural risks, benefits and alternatives. All questions were addressed. Maximal Sterile Barrier  Technique was utilized including caps, mask, sterile gowns, sterile gloves, sterile drape, hand hygiene and skin antiseptic. A timeout was performed prior to the initiation of the procedure. The right groin was prepped and draped in the usual sterile fashion. Thereafter using modified Seldinger technique, transfemoral access into the right common femoral artery was obtained without difficulty. Over a 0.035 inch guidewire, a 5 French Pinnacle sheath was inserted. Through this, and also over 0.035 inch guidewire, a 5 [place] JB 1 catheter was advanced to the aortic arch region and selectively positioned in the right common carotid artery, right subclavian artery, the left common carotid artery and the left vertebral artery. FINDINGS: The right common carotid arteriogram demonstrates the right external carotid artery and its major branches to be widely patent. The right internal carotid artery at the bulb to the cranial skull base opacifies widely. The petrous, cavernous and supraclinoid segments are widely patent. The right middle cerebral artery M1 segment is widely patent. There is a mild to moderate stenosis at the distal right M1 segment. The right middle cerebral artery superior division demonstrates significant proximal stenosis, as does the parietal branch from the inferior division. Distally the vessel is seen to opacify into the capillary and venous phases. The right anterior cerebral artery has high-grade tandem stenosis of the distal A1 segment, and the right A1 A2 junction. The left vertebral artery origin is widely patent. The vessel opacify to the cranial skull base. Patency is seen of the left vertebrobasilar junction and the left posterior-inferior cerebellar artery. The left anterior-inferior cerebellar artery/left posterior-inferior cerebellar artery complex is seen. The basilar artery, the posterior cerebral arteries, the superior cerebellar arteries and the anterior-inferior cerebellar arteries  opacify into the capillary and venous phases. Unopacified blood is seen in the basilar artery from the contralateral vertebral artery. There is approximately 60% stenosis of the left P1 distally. The left common carotid arteriogram demonstrates the left external carotid artery and its major branches to be widely patent. The left internal carotid artery at the bulb to the cranial skull base is widely patent. The petrous, the cavernous and the supraclinoid segments are widely patent. The left middle cerebral artery demonstrates a mild tapered narrowing of the distal M1 segment. The inferior division demonstrates a moderate stenosis proximally. The superior division demonstrates occlusion in the distal M2 segment. The left anterior cerebral artery A2 segment demonstrates significant segmental stenosis of the proximal A2 segment. More distally focal areas of caliber irregularity with narrowing are seen involving the callosal marginal, and the pericallosal branches. The right subclavian artery is the last branch arising from the aortic arch. This projects medially centrally and then towards the right subclavian region, consistent with aberrant origin of the right subclavian artery, a developmental variation. The right vertebral artery origin from the aberrant right subclavian artery appears widely patent proximally to the cranial skull base. Patency is seen to the right vertebrobasilar  junction with patency seen of the right posterior-inferior cerebellar artery. IMPRESSION: Moderate stenosis of the right middle cerebral artery distal M1 segment. Severe segmental stenosis of the superior division of the right middle cerebral artery proximally, and of a parietal branch of the inferior division of the right middle cerebral artery. Severe tandem segmental stenosis of the right anterior cerebral artery of the distal A1 segment and at the A1 A2 junction. Moderate stenosis of the inferior division of the left middle cerebral  artery proximally. The superior division demonstrates occlusion in the distal M2 region. Left anterior cerebral artery significant segmental stenosis of the proximal A2 segment. Focal areas of segmental stenosis of the proximal left pericallosal, and the callosal marginal arteries. Approximately 60% stenosis of the left posterior cerebral artery distal P1 segment. PLAN: Findings discussed with the patient, and with the referring MD. Electronically Signed   By: Luanne Bras M.D.   On: 06/02/2020 17:04     The results of significant diagnostics from this hospitalization (including imaging, microbiology, ancillary and laboratory) are listed below for reference.     Microbiology: Recent Results (from the past 240 hour(s))  Resp Panel by RT-PCR (Flu A&B, Covid) Nasopharyngeal Swab     Status: None   Collection Time: 06/15/20 10:26 PM   Specimen: Nasopharyngeal Swab; Nasopharyngeal(NP) swabs in vial transport medium  Result Value Ref Range Status   SARS Coronavirus 2 by RT PCR NEGATIVE NEGATIVE Final    Comment: (NOTE) SARS-CoV-2 target nucleic acids are NOT DETECTED.  The SARS-CoV-2 RNA is generally detectable in upper respiratory specimens during the acute phase of infection. The lowest concentration of SARS-CoV-2 viral copies this assay can detect is 138 copies/mL. A negative result does not preclude SARS-Cov-2 infection and should not be used as the sole basis for treatment or other patient management decisions. A negative result may occur with  improper specimen collection/handling, submission of specimen other than nasopharyngeal swab, presence of viral mutation(s) within the areas targeted by this assay, and inadequate number of viral copies(<138 copies/mL). A negative result must be combined with clinical observations, patient history, and epidemiological information. The expected result is Negative.  Fact Sheet for Patients:  EntrepreneurPulse.com.au  Fact  Sheet for Healthcare Providers:  IncredibleEmployment.be  This test is no t yet approved or cleared by the [place] FDA and  has been authorized for detection and/or diagnosis of SARS-CoV-2 by FDA under an Emergency Use Authorization (EUA). This EUA will remain  in effect (meaning this test can be used) for the duration of the COVID-19 declaration under Section 564(b)(1) of the Act, 21 U.S.C.section 360bbb-3(b)(1), unless the authorization is terminated  or revoked sooner.       Influenza A by PCR NEGATIVE NEGATIVE Final   Influenza B by PCR NEGATIVE NEGATIVE Final    Comment: (NOTE) The Xpert Xpress SARS-CoV-2/FLU/RSV plus assay is intended as an aid in the diagnosis of influenza from Nasopharyngeal swab specimens and should not be used as a sole basis for treatment. Nasal washings and aspirates are unacceptable for Xpert Xpress SARS-CoV-2/FLU/RSV testing.  Fact Sheet for Patients: EntrepreneurPulse.com.au  Fact Sheet for Healthcare Providers: IncredibleEmployment.be  This test is not yet approved or cleared by the [place] FDA and has been authorized for detection and/or diagnosis of SARS-CoV-2 by FDA under an Emergency Use Authorization (EUA). This EUA will remain in effect (meaning this test can be used) for the duration of the COVID-19 declaration under Section 564(b)(1) of the Act, 21 U.S.C. section 360bbb-3(b)(1), unless the  authorization is terminated or revoked.  Performed at [facility], [place] [address], [place], [place] [address]      Labs: BNP (last 3 results) No results for input(s): BNP in the last 8760 hours. Basic Metabolic Panel: Recent Labs  Lab 06/17/20 0428 06/18/20 0130  NA 137 142  K 3.8 3.6  CL 99 107  CO2 30 28  GLUCOSE 88 97  BUN 10 11  CREATININE 1.16 1.12  CALCIUM 8.8* 8.9  MG 1.8  --    Liver Function Tests: No results for input(s): AST, ALT, ALKPHOS, BILITOT,  PROT, ALBUMIN in the last 168 hours. No results for input(s): LIPASE, AMYLASE in the last 168 hours. No results for input(s): AMMONIA in the last 168 hours. CBC: No results for input(s): WBC, NEUTROABS, HGB, HCT, MCV, PLT in the last 168 hours. Cardiac Enzymes: No results for input(s): CKTOTAL, CKMB, CKMBINDEX, TROPONINI in the last 168 hours. BNP: Invalid input(s): POCBNP CBG: Recent Labs  Lab 06/22/20 0630 06/22/20 1124 06/22/20 1612 06/22/20 2122 06/23/20 0647  GLUCAP 112* 118* 133* 180* 123*   D-Dimer No results for input(s): DDIMER in the last 72 hours. Hgb A1c No results for input(s): HGBA1C in the last 72 hours. Lipid Profile No results for input(s): CHOL, HDL, LDLCALC, TRIG, CHOLHDL, LDLDIRECT in the last 72 hours. Thyroid function studies No results for input(s): TSH, T4TOTAL, T3FREE, THYROIDAB in the last 72 hours.  Invalid input(s): FREET3 Anemia work up No results for input(s): VITAMINB12, FOLATE, FERRITIN, TIBC, IRON, RETICCTPCT in the last 72 hours. Urinalysis    Component Value Date/Time   COLORURINE YELLOW 06/16/2020 1020   APPEARANCEUR CLEAR 06/16/2020 1020   LABSPEC 1.029 06/16/2020 1020   PHURINE 5.0 06/16/2020 1020   GLUCOSEU NEGATIVE 06/16/2020 1020   HGBUR NEGATIVE 06/16/2020 1020   BILIRUBINUR NEGATIVE 06/16/2020 1020   KETONESUR NEGATIVE 06/16/2020 1020   PROTEINUR NEGATIVE 06/16/2020 1020   NITRITE NEGATIVE 06/16/2020 1020   LEUKOCYTESUR NEGATIVE 06/16/2020 1020   Sepsis Labs Invalid input(s): PROCALCITONIN,  WBC,  LACTICIDVEN Microbiology Recent Results (from the past 240 hour(s))  Resp Panel by RT-PCR (Flu A&B, Covid) Nasopharyngeal Swab     Status: None   Collection Time: 06/15/20 10:26 PM   Specimen: Nasopharyngeal Swab; Nasopharyngeal(NP) swabs in vial transport medium  Result Value Ref Range Status   SARS Coronavirus 2 by RT PCR NEGATIVE NEGATIVE Final    Comment: (NOTE) SARS-CoV-2 target nucleic acids are NOT DETECTED.  The  SARS-CoV-2 RNA is generally detectable in upper respiratory specimens during the acute phase of infection. The lowest concentration of SARS-CoV-2 viral copies this assay can detect is 138 copies/mL. A negative result does not preclude SARS-Cov-2 infection and should not be used as the sole basis for treatment or other patient management decisions. A negative result may occur with  improper specimen collection/handling, submission of specimen other than nasopharyngeal swab, presence of viral mutation(s) within the areas targeted by this assay, and inadequate number of viral copies(<138 copies/mL). A negative result must be combined with clinical observations, patient history, and epidemiological information. The expected result is Negative.  Fact Sheet for Patients:  EntrepreneurPulse.com.au  Fact Sheet for Healthcare Providers:  IncredibleEmployment.be  This test is no t yet approved or cleared by the [place] FDA and  has been authorized for detection and/or diagnosis of SARS-CoV-2 by FDA under an Emergency Use Authorization (EUA). This EUA will remain  in effect (meaning this test can be used) for the duration of the COVID-19  declaration under Section 564(b)(1) of the Act, 21 U.S.C.section 360bbb-3(b)(1), unless the authorization is terminated  or revoked sooner.       Influenza A by PCR NEGATIVE NEGATIVE Final   Influenza B by PCR NEGATIVE NEGATIVE Final    Comment: (NOTE) The Xpert Xpress SARS-CoV-2/FLU/RSV plus assay is intended as an aid in the diagnosis of influenza from Nasopharyngeal swab specimens and should not be used as a sole basis for treatment. Nasal washings and aspirates are unacceptable for Xpert Xpress SARS-CoV-2/FLU/RSV testing.  Fact Sheet for Patients: EntrepreneurPulse.com.au  Fact Sheet for Healthcare Providers: IncredibleEmployment.be  This test is not yet approved or  cleared by the [place] FDA and has been authorized for detection and/or diagnosis of SARS-CoV-2 by FDA under an Emergency Use Authorization (EUA). This EUA will remain in effect (meaning this test can be used) for the duration of the COVID-19 declaration under Section 564(b)(1) of the Act, 21 U.S.C. section 360bbb-3(b)(1), unless the authorization is terminated or revoked.  Performed at [facility], [place] [address] [place], [place] [address]      Time coordinating discharge in minutes: 65  SIGNED:   Debbe Odea, MD  [facility] 06/23/2020, 11:32 AM

## 2020-06-23 NOTE — Progress Notes (Addendum)
Received patient in bed. Patient is AOX3,  VSS, respiration even and unlabored, denies pain. Two person skin assessment done with Dauda, RN. Patient skin is dry, warm, and intact.

## 2020-06-23 NOTE — H&P (Signed)
Physical Medicine and Rehabilitation Admission H&P        Chief Complaint  Patient presents with  . Code Stroke  : HPI: Nathaniel Spears is a 45 year old right-handed male with history of hyperlipidemia, hypertension, diabetes mellitus, tobacco use.  Per chart review lives with spouse.  Two-level home 6 steps to entry.  Prior to initial CVA of April 2022 patient independent in all tasks.  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  He had received work-up at [facility] per neurology.  Current work-up was negative for vasculitis.  He returned to [facility] 06/15/2020 with word finding deficits.  MRI of the brain showed several new or increased acute infarcts in both cerebral hemispheres.  CTA of the head/ neck showed no large vessel occlusion.  Unchanged multifocal severe intracranial stenosis.  EEG was negative for seizure but did suggest cortical dysfunction in the right hemisphere secondary to underlying stroke.  It also showed diffuse encephalopathy nonspecific in etiology.  Maintained on Keppra for seizure prophylaxis.  Most recent echocardiogram with ejection fraction of 60 to 65% no wall motion abnormalities.  Patient was placed on aspirin and Plavix as well as statin prior to arrival to [facility].  He was changed to aspirin 81 mg and Brilinta 90 mg BID that he would continue for 30 days then ASA 325 mg and PLAVIX 75 mg daily for another 2 months and then PLAVIX alone.  Lovenox initiated for DVT prophylaxis.  Tolerating a regular consistency diet.  Therapy evaluations completed recommendations of physical medicine rehab consult due to patient's aphasia, gait disorder sensory cognitive balance deficits was admitted for a comprehensive rehab program.   Review of Systems  Constitutional: Negative for chills and fever.  HENT: Negative for hearing loss.   Eyes: Negative for blurred vision and double vision.  Respiratory: Negative for  cough and shortness of breath.   Cardiovascular: Negative for chest pain, palpitations and leg swelling.  Gastrointestinal: Positive for constipation. Negative for heartburn, nausea and vomiting.  Genitourinary: Negative for dysuria, flank pain and hematuria.  Musculoskeletal: Positive for myalgias.  Skin: Negative for rash.  Neurological: Positive for dizziness, sensory change, speech change, weakness and headaches.  Psychiatric/Behavioral: Positive for depression.  All other systems reviewed and are negative.   Past Medical History:  Diagnosis Date  . Hypertension    . Stroke [facility])           Past Surgical History:  Procedure Laterality Date  . BUBBLE STUDY   06/01/2020    Procedure: BUBBLE STUDY;  Surgeon: Donato Heinz, MD;  Location: [facility] ENDOSCOPY;  Service: Cardiovascular;;  . IR ANGIO INTRA EXTRACRAN SEL COM CAROTID INNOMINATE BILAT MOD SED   06/02/2020  . IR ANGIO VERTEBRAL SEL SUBCLAVIAN INNOMINATE BILAT MOD SED   06/02/2020  . TEE WITHOUT CARDIOVERSION N/A 06/01/2020    Procedure: TRANSESOPHAGEAL ECHOCARDIOGRAM (TEE);  Surgeon: Donato Heinz, MD;  Location: [facility] ENDOSCOPY;  Service: Cardiovascular;  Laterality: N/A;         Family History  Problem Relation Age of Onset  . Stroke Neg Hx      Social History:  reports that he has quit smoking. He has never used smokeless tobacco. He reports previous alcohol use. He reports that he does not use drugs. Allergies: No Known Allergies       Medications Prior to Admission  Medication Sig Dispense Refill  . acetaminophen (TYLENOL) 325 MG tablet Take 2 tablets (650 mg total) by  mouth every 4 (four) hours as needed for mild pain (or temp > 37.5 C (99.5 F)). 20 tablet 0  . aspirin 325 MG EC tablet Take 1 tablet (325 mg total) by mouth daily. 30 tablet 2  . atorvastatin (LIPITOR) 40 MG tablet Take 2 tablets (80 mg total) by mouth daily.      . blood glucose meter kit and supplies Dispense based on patient and insurance  preference. Use up to four times daily as directed. (FOR ICD-10 E10.9, E11.9). (Patient taking differently: Dispense based on patient and insurance preference. Qd (FOR ICD-10 E10.9, E11.9).) 1 each 0  . Blood Glucose Monitoring Suppl (TRUE METRIX METER) w/Device KIT Use to check blood sugar TID. (Patient taking differently: Use to check blood sugar qd) 1 kit 0  . clopidogrel (PLAVIX) 75 MG tablet Take 1 tablet (75 mg total) by mouth daily. 30 tablet 0  . glucose blood (TRUE METRIX BLOOD GLUCOSE TEST) test strip Use to check blood sugar TID. (Patient taking differently: Use to check blood sugar qd) 100 each 2  . insulin detemir (LEVEMIR FLEXTOUCH) 100 UNIT/ML FlexPen Inject 20 Units under the skin nightly for 30 days. (Patient taking differently: Inject 20 Units into the skin daily.) 15 mL 0  . levETIRAcetam (KEPPRA) 500 MG tablet Take 1 tablet (500 mg total) by mouth 2 (two) times a day for 30 days. Home med 60 tablet 0  . metFORMIN (GLUCOPHAGE) 1000 MG tablet Take 1,000 mg by mouth 2 (two) times daily with a meal.      . TRUEplus Lancets 28G MISC Use to check blood sugar 3 times daily. (Patient taking differently: Use to check blood sugar qd) 100 each 2  . aspirin EC 81 MG tablet Take 1 tablet (81 mg total) by mouth daily for 30 days. 30 tablet 0  . atorvastatin (LIPITOR) 80 MG tablet Take 1 tablet (80 mg total) by mouth nightly for 30 days. 30 tablet 0  . clopidogrel (PLAVIX) 75 MG tablet Take 1 tablet (75 mg total) by mouth daily for 30 days. 30 tablet 0  . Insulin Glargine (BASAGLAR KWIKPEN) 100 UNIT/ML Inject 15 Units into the skin daily. (Patient not taking: No sig reported) 10 mL 2  . Insulin Glargine (BASAGLAR KWIKPEN) 100 UNIT/ML Inject 20 Units under the skin nightly for 30 days. After done with levemir 6 mL 0  . metFORMIN (GLUCOPHAGE-XR) 500 MG 24 hr tablet Take 2 tablets (1,000 mg total) by mouth 2 (two) times a day with meals for 30 days. (Patient not taking: No sig reported) 120 tablet 0   . methylPREDNISolone sodium succinate 1,000 mg in sodium chloride 0.9 % 50 mL Inject 1,000 mg into the vein daily. (Patient not taking: No sig reported)          Drug Regimen Review Drug regimen was reviewed and remains appropriate with no significant issues identified.   Home: Home Living Family/patient expects to be discharged to:: Private residence Living Arrangements: Spouse/significant other Available Help at Discharge: Other (Comment),Available PRN/intermittently (significant other works from home) Type of Home: [place] Access: Stairs to enter [employer] of Steps: 6-7 Entrance Stairs-Rails: None Home Layout: Two level Alternate Level Stairs-Number of Steps: 13 Alternate Level Stairs-Rails: Right,Left,Can reach both (lower part: rails on both sides, can reach at same time. upper part: rail is on right side) Bathroom Shower/Tub: Chiropodist: Standard Bathroom Accessibility: Yes Home Equipment: None Additional Comments: wife at home recently, denied FMLA approval after recent medical events.  Lives With: Significant other   Functional History: Prior Function Level of Independence: Needs assistance Gait / Transfers Assistance Needed: pt was independent prior to april, mobilizing independently to bathroom and up stairs since last admission per spouse ADL's / Homemaking Assistance Needed: requiring supervision ADL/IADLs including cooking and bathing since last admission in May Communication / Swallowing Assistance Needed: seeing speech therapy for follow-up on aphasia since last admission in May 2022 Comments: Informantion taken from chart, patient is unable to describe, and no family in the room.   Functional Status:  Mobility: Bed Mobility Overal bed mobility: Needs Assistance Bed Mobility: Supine to Sit Rolling: Mod assist Sidelying to sit: Mod assist Supine to sit: Min assist,HOB elevated Sit to supine: Mod assist General bed  mobility comments: cues for sequencing, increased time Transfers Overall transfer level: Needs assistance Equipment used: 1 person hand held assist Transfers: Sit to/from [employer] Sit to Stand: Min assist Stand pivot transfers: Min assist General transfer comment: assist to power up and stabilize balance Ambulation/Gait Ambulation/Gait assistance: Min assist Gait Distance (Feet): 20 Feet (x 2) Assistive device: 1 person hand held assist Gait Pattern/deviations: Step-through pattern,Decreased stride length,Wide base of support General Gait Details: Gait limited to in room due to pt soiled with BM and unaware. Assisted to/from bathroom for further BM in toilet and washing up. Gait velocity: decreased Gait velocity interpretation: <1.8 ft/sec, indicate of risk for recurrent falls   ADL: ADL General ADL Comments: ADL testing ongoing   Cognition: Cognition Overall Cognitive Status: Impaired/Different from baseline Arousal/Alertness: Awake/alert Orientation Level: Oriented to person,Oriented to place,Oriented to situation,Disoriented to time Cognition Arousal/Alertness: Awake/alert Behavior During Therapy: Flat affect Overall Cognitive Status: Impaired/Different from baseline Area of Impairment: Following commands,Safety/judgement,Awareness,Problem solving,Attention Orientation Level: Disoriented to,Time Current Attention Level: Sustained Memory: Decreased recall of precautions Following Commands: Follows one step commands inconsistently,Follows one step commands with increased time Safety/Judgement: Decreased awareness of safety,Decreased awareness of deficits Awareness: Intellectual Problem Solving: Difficulty sequencing,Requires verbal cues,Requires tactile cues,Slow processing General Comments: Pt demonstrates frustration with expressive difficulties. Easily distracted. Cues to stay on task.   Physical Exam: Blood pressure (!) 129/100, pulse 97, temperature  97.6 F (36.4 C), temperature source Oral, resp. rate 18, height 6' (1.829 m), weight 115 kg, SpO2 98 %. Physical Exam Neurological:     Comments: Patient is alert.  Expressive receptive aphasia.  He was only able to state his name.  Unable to name objects.  Inconsistent following commands.       General: No acute distress Mood and affect are appropriate Heart: Regular rate and rhythm no rubs murmurs or extra sounds Lungs: Clear to auscultation, breathing unlabored, no rales or wheezes Abdomen: Positive bowel sounds, soft nontender to palpation, nondistended Extremities: No clubbing, cyanosis, or edema Skin: No evidence of breakdown, no evidence of rash Neurologic: Cranial nerves II through XII intact, motor strength is 5/5 in bilateral deltoid, bicep, tricep, grip,does not cooperate well with LE testing due to apraxia (has spontaneous movement of BLE but not to command) Aphasic- not consistent with Y/N, difficulty following commands, gestural cues are helpful Sensory exam unable to assess due to aphasia   Musculoskeletal: Full range of motion in all 4 extremities. No joint swelling  Lab Results Last 48 Hours        Results for orders placed or performed during the hospital encounter of 06/15/20 (from the past 48 hour(s))  Glucose, capillary     Status: Abnormal    Collection Time: 06/20/20 12:44 PM  Result  Value Ref Range    Glucose-Capillary 114 (H) 70 - 99 mg/dL      Comment: Glucose reference range applies only to samples taken after fasting for at least 8 hours.  Glucose, capillary     Status: Abnormal    Collection Time: 06/20/20  4:58 PM  Result Value Ref Range    Glucose-Capillary 100 (H) 70 - 99 mg/dL      Comment: Glucose reference range applies only to samples taken after fasting for at least 8 hours.  Glucose, capillary     Status: None    Collection Time: 06/20/20  9:19 PM  Result Value Ref Range    Glucose-Capillary 92 70 - 99 mg/dL      Comment: Glucose reference  range applies only to samples taken after fasting for at least 8 hours.  Glucose, capillary     Status: None    Collection Time: 06/21/20  6:15 AM  Result Value Ref Range    Glucose-Capillary 90 70 - 99 mg/dL      Comment: Glucose reference range applies only to samples taken after fasting for at least 8 hours.  Glucose, capillary     Status: Abnormal    Collection Time: 06/21/20 11:37 AM  Result Value Ref Range    Glucose-Capillary 115 (H) 70 - 99 mg/dL      Comment: Glucose reference range applies only to samples taken after fasting for at least 8 hours.  Glucose, capillary     Status: None    Collection Time: 06/21/20  4:56 PM  Result Value Ref Range    Glucose-Capillary 99 70 - 99 mg/dL      Comment: Glucose reference range applies only to samples taken after fasting for at least 8 hours.  Glucose, capillary     Status: None    Collection Time: 06/21/20  9:39 PM  Result Value Ref Range    Glucose-Capillary 90 70 - 99 mg/dL      Comment: Glucose reference range applies only to samples taken after fasting for at least 8 hours.  Glucose, capillary     Status: Abnormal    Collection Time: 06/22/20  6:30 AM  Result Value Ref Range    Glucose-Capillary 112 (H) 70 - 99 mg/dL      Comment: Glucose reference range applies only to samples taken after fasting for at least 8 hours.      Imaging Results (Last 48 hours)  No results found.           Medical Problem List and Plan: 1.  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  Antithrombotics: -DVT/anticoagulation: Lovenox.             -antiplatelet therapy: Aspirin 81 mg daily and Brilinta 90 mg twice daily x30 days then aspirin 325 mg daily and Plavix 75 mg daily for another 2 months and then Plavix alone 3. Pain Management: Tylenol as needed 4. Mood: Prozac 20 mg daily              -antipsychotic agents: N/A 5. Neuropsych: This patient is not capable of making decisions on his own behalf. 6. Skin/Wound Care: Routine skin checks 7. Fluids/Electrolytes/Nutrition: Routine in and outs with follow-up chemistries 8.  Diabetes mellitus.  Hemoglobin A1c 9.9.  Levemir 10 units daily.  Check blood sugars before meals and at bedtime.  Diabetic teaching 9.  Seizure prophylaxis.  Keppra 1000 mg twice daily 10.  Hypertension.  Norvasc 5 mg daily, HCTZ 12.5 mg daily.  Monitor with increased mobility 11.  Hyperlipidemia.  Lipitor 12.  History of tobacco use.  Counseling   Lavon Paganini Angiulli, PA-C 06/22/2020  "I have personally performed a face to face diagnostic evaluation of this patient.  Additionally, I have reviewed and concur with the physician assistant's documentation above." Charlett Blake M.D. [facility] Fellow Am Acad of Phys Med and Rehab Diplomate Am Board of Electrodiagnostic Med Fellow Am Board of Interventional Pain

## 2020-06-23 NOTE — Progress Notes (Signed)
Inpatient Rehabilitation Medication Review by a Pharmacist  A complete drug regimen review was completed for this patient to identify any potential clinically significant medication issues.  Clinically significant medication issues were identified:  yes  Check AMION for pharmacist assigned to patient if future medication questions/issues arise during this admission.  Pharmacist comments:   Time spent performing this drug regimen review (minutes):  20   Lynnel Zanetti 06/23/2020 5:16 PM

## 2020-06-24 ENCOUNTER — Encounter: Payer: Self-pay | Admitting: Occupational Therapy

## 2020-06-24 LAB — CBC WITH DIFFERENTIAL/PLATELET
Abs Immature Granulocytes: 0.02 10*3/uL (ref 0.00–0.07)
Basophils Absolute: 0 10*3/uL (ref 0.0–0.1)
Basophils Relative: 1 %
Eosinophils Absolute: 0.2 10*3/uL (ref 0.0–0.5)
Eosinophils Relative: 4 %
HCT: 37.3 % — ABNORMAL LOW (ref 39.0–52.0)
Hemoglobin: 12.2 g/dL — ABNORMAL LOW (ref 13.0–17.0)
Immature Granulocytes: 0 %
Lymphocytes Relative: 41 %
Lymphs Abs: 2.1 10*3/uL (ref 0.7–4.0)
MCH: 26.1 pg (ref 26.0–34.0)
MCHC: 32.7 g/dL (ref 30.0–36.0)
MCV: 79.9 fL — ABNORMAL LOW (ref 80.0–100.0)
Monocytes Absolute: 0.5 10*3/uL (ref 0.1–1.0)
Monocytes Relative: 9 %
Neutro Abs: 2.3 10*3/uL (ref 1.7–7.7)
Neutrophils Relative %: 45 %
Platelets: 171 10*3/uL (ref 150–400)
RBC: 4.67 MIL/uL (ref 4.22–5.81)
RDW: 13.5 % (ref 11.5–15.5)
WBC: 5.1 10*3/uL (ref 4.0–10.5)
nRBC: 0 % (ref 0.0–0.2)

## 2020-06-24 LAB — COMPREHENSIVE METABOLIC PANEL
ALT: 33 U/L (ref 0–44)
AST: 18 U/L (ref 15–41)
Albumin: 3.3 g/dL — ABNORMAL LOW (ref 3.5–5.0)
Alkaline Phosphatase: 74 U/L (ref 38–126)
Anion gap: 9 (ref 5–15)
BUN: 16 mg/dL (ref 6–20)
CO2: 27 mmol/L (ref 22–32)
Calcium: 8.9 mg/dL (ref 8.9–10.3)
Chloride: 104 mmol/L (ref 98–111)
Creatinine, Ser: 1.32 mg/dL — ABNORMAL HIGH (ref 0.61–1.24)
GFR, Estimated: >60 mL/min (ref >60)
Glucose, Bld: 102 mg/dL — ABNORMAL HIGH (ref 70–99)
Potassium: 3.4 mmol/L — ABNORMAL LOW (ref 3.5–5.1)
Sodium: 140 mmol/L (ref 135–145)
Total Bilirubin: 0.9 mg/dL (ref 0.3–1.2)
Total Protein: 6.4 g/dL — ABNORMAL LOW (ref 6.5–8.1)

## 2020-06-24 LAB — GLUCOSE, CAPILLARY
Glucose-Capillary: 90 mg/dL (ref 70–99)
Glucose-Capillary: 125 mg/dL — ABNORMAL HIGH (ref 70–99)
Glucose-Capillary: 85 mg/dL (ref 70–99)

## 2020-06-24 MED ORDER — AMLODIPINE BESYLATE 10 MG PO TABS
10.0000 mg | ORAL_TABLET | Freq: Every day | ORAL | Status: DC
  Administered 2020-06-25 – 2020-07-08 (×14): 10 mg via ORAL
  Filled 2020-06-24 (×14): qty 1

## 2020-06-24 MED ORDER — POTASSIUM CHLORIDE 20 MEQ PO PACK
40.0000 meq | PACK | Freq: Once | ORAL | Status: AC
  Administered 2020-06-24: 40 meq via ORAL
  Filled 2020-06-24: qty 2

## 2020-06-24 NOTE — Plan of Care (Signed)
  Problem: RH BOWEL ELIMINATION Goal: RH STG MANAGE BOWEL WITH ASSISTANCE Description: STG Manage Bowel with mod I Assistance. Outcome: Not Progressing   Problem: RH BLADDER ELIMINATION Goal: RH STG MANAGE BLADDER WITH ASSISTANCE Description: STG Manage Bladder With  mod I Assistance Outcome: Not Progressing; incontinence   Problem: RH KNOWLEDGE DEFICIT Goal: RH STG INCREASE KNOWLEDGE OF DIABETES Description: Patient and significant other will be able to manage DM with medications and dietary modifications using handouts and educational resources independently Outcome: Not Progressing; incontinence   Problem: RH KNOWLEDGE DEFICIT Goal: RH STG INCREASE KNOWLEDGE OF HYPERTENSION Description: Patient and significant other will be able to manage HTN with medications and dietary modifications using handouts and educational resources independently Outcome: Not Progressing; expressive aphasia   Problem: RH KNOWLEDGE DEFICIT Goal: RH STG INCREASE KNOWLEGDE OF HYPERLIPIDEMIA Description: Patient and significant other will be able to manage HLD with medications and dietary modifications using handouts and educational resources independently Outcome: Not Progressing; expressive aphasia

## 2020-06-24 NOTE — Progress Notes (Signed)
Inpatient Rehabilitation Center Individual Statement of Services  Patient Name:  Nathaniel Spears  Date:  06/24/2020  Welcome to the Inpatient Rehabilitation Center.  Our goal is to provide you with an individualized program based on your diagnosis and situation, designed to meet your specific needs.  With this comprehensive rehabilitation program, you will be expected to participate in at least 3 hours of rehabilitation therapies Monday-Friday, with modified therapy programming on the weekends.  Your rehabilitation program will include the following services:  Physical Therapy (PT), Occupational Therapy (OT), Speech Therapy (ST), 24 hour per day rehabilitation nursing, Therapeutic Recreaction (TR), Neuropsychology, Care Coordinator, Rehabilitation Medicine, Nutrition Services, Pharmacy Services, and Other  Weekly team conferences will be held on Tuesdays to discuss your progress.  Your Inpatient Rehabilitation Care Coordinator will talk with you frequently to get your input and to update you on team discussions.  Team conferences with you and your family in attendance may also be held.  Expected length of stay: 10-13 Days  Overall anticipated outcome: MOD I to Supervision  Depending on your progress and recovery, your program may change. Your Inpatient Rehabilitation Care Coordinator will coordinate services and will keep you informed of any changes. Your Inpatient Rehabilitation Care Coordinator's name and contact numbers are listed  below.  The following services may also be recommended but are not provided by the Inpatient Rehabilitation Center:   Home Health Rehabiltiation Services Outpatient Rehabilitation Services    Arrangements will be made to provide these services after discharge if needed.  Arrangements include referral to agencies that provide these services.  Your insurance has been verified to be:  uninsured Your primary doctor is:  Gwinda Passe, NP  Pertinent information  will be shared with your doctor and your insurance company.  Inpatient Rehabilitation Care Coordinator:  Lavera Guise, [place] [phone] or [phone]  Information discussed with and copy given to patient by: Andria Rhein, 06/24/2020, 11:46 AM

## 2020-06-24 NOTE — Discharge Instructions (Addendum)
Inpatient Rehab Discharge Instructions  Nathaniel Spears Discharge date and time: No discharge date for patient encounter.   Activities/Precautions/ Functional Status: Activity: activity as tolerated Diet: diabetic diet Wound Care: Routine skin checks Functional status:  ___ No restrictions     ___ Walk up steps independently ___ 24/7 supervision/assistance   ___ Walk up steps with assistance ___ Intermittent supervision/assistance  ___ Bathe/dress independently ___ Walk with walker     __x_ Bathe/dress with assistance ___ Walk Independently    ___ Shower independently ___ Walk with assistance    ___ Shower with assistance ___ No alcohol     ___ Return to work/school ________  COMMUNITY REFERRALS UPON DISCHARGE:    Outpatient: PT     OT    ST                Agency: [facility] Phone:[phone]              Appointment Date/Time:TBD   Special Instructions: No driving smoking or alcohol  Plan aspirin 81 mg daily and Brilinta 90 mg twice daily until 07/15/2020 then begin aspirin 325 mg and Plavix 75 mg daily beginning 07/16/2020 x 2 months then Plavix alone   My questions have been answered and I understand these instructions. I will adhere to these goals and the provided educational materials after my discharge from the hospital.  Patient/Caregiver Signature _______________________________ Date __________  Clinician Signature _______________________________________ Date __________  Please bring this form and your medication list with you to all your follow-up doctor's appointments.  STROKE/TIA DISCHARGE INSTRUCTIONS SMOKING Cigarette smoking nearly doubles your risk of having a stroke & is the single most alterable risk factor  If you smoke or have smoked in the last 12 months, you are advised to quit smoking for your health. Most of the excess cardiovascular risk related to smoking disappears within a year of stopping. Ask you doctor about anti-smoking medications [place]  Quit Line: 1-800-QUIT NOW Free Smoking Cessation Classes [phone]  CHOLESTEROL Know your levels; limit fat & cholesterol in your diet  Lipid Panel     Component Value Date/Time   CHOL 74 05/31/2020 0330   TRIG 55 05/31/2020 0330   HDL 24 (L) 05/31/2020 0330   CHOLHDL 3.1 05/31/2020 0330   VLDL 11 05/31/2020 0330   LDLCALC 39 05/31/2020 0330     Many patients benefit from treatment even if their cholesterol is at goal. Goal: Total Cholesterol (CHOL) less than 160 Goal:  Triglycerides (TRIG) less than 150 Goal:  HDL greater than 40 Goal:  LDL (LDLCALC) less than 100   BLOOD PRESSURE American Stroke Association blood pressure target is less that 120/80 mm/Hg  Your discharge blood pressure is:  BP: (!) 143/84 Monitor your blood pressure Limit your salt and alcohol intake Many individuals will require more than one medication for high blood pressure  DIABETES (A1c is a blood sugar average for last 3 months) Goal HGBA1c is under 7% (HBGA1c is blood sugar average for last 3 months)  Diabetes:   Lab Results  Component Value Date   HGBA1C 10.3 (H) 05/31/2020    Your HGBA1c can be lowered with medications, healthy diet, and exercise. Check your blood sugar as directed by your physician Call your physician if you experience unexplained or low blood sugars.  PHYSICAL ACTIVITY/REHABILITATION Goal is 30 minutes at least 4 days per week  Activity: Increase activity slowly, Therapies: Physical Therapy: Home Health Return to work:  Activity decreases your risk of heart  attack and stroke and makes your heart stronger.  It helps control your weight and blood pressure; helps you relax and can improve your mood. Participate in a regular exercise program. Talk with your doctor about the best form of exercise for you (dancing, walking, swimming, cycling).  DIET/WEIGHT Goal is to maintain a healthy weight  Your discharge diet is:  Diet Order             Diet heart healthy/carb modified  Room service appropriate? Yes; Fluid consistency: Thin  Diet effective now                   liquids Your height is:  Height: 6\' 1"  (185.4 cm) Your current weight is: Weight: 104.7 kg Your Body Mass Index (BMI) is:  BMI (Calculated): 30.47 Following the type of diet specifically designed for you will help prevent another stroke. Your goal weight range is:   Your goal Body Mass Index (BMI) is 19-24. Healthy food habits can help reduce 3 risk factors for stroke:  High cholesterol, hypertension, and excess weight.  RESOURCES Stroke/Support Group:  Call [phone]   STROKE EDUCATION PROVIDED/REVIEWED AND GIVEN TO PATIENT Stroke warning signs and symptoms How to activate emergency medical system (call 911). Medications prescribed at discharge. Need for follow-up after discharge. Personal risk factors for stroke. Pneumonia vaccine given:  Flu vaccine given:  My questions have been answered, the writing is legible, and I understand these instructions.  I will adhere to these goals & educational materials that have been provided to me after my discharge from the hospital.

## 2020-06-24 NOTE — Progress Notes (Signed)
Inpatient Rehabilitation  Patient information reviewed and entered into eRehab system by Khalidah Herbold Sheria Rosello, OTR/L.   Information including medical coding, functional ability and quality indicators will be reviewed and updated through discharge.    

## 2020-06-24 NOTE — Progress Notes (Signed)
Inpatient Rehabilitation Care Coordinator Assessment and Plan Patient Details  Name: Nathaniel Spears MRN: [id number] Date of Birth: [birth year]  Today's Date: 06/24/2020  Hospital Problems: Principal Problem:   Acute ischemic stroke [facility]) Active Problems:   Middle cerebral artery embolism, bilateral  Past Medical History:  Past Medical History:  Diagnosis Date   Hypertension    Stroke [facility])    Past Surgical History:  Past Surgical History:  Procedure Laterality Date   BUBBLE STUDY  06/01/2020   Procedure: BUBBLE STUDY;  Surgeon: Donato Heinz, MD;  Location: [place];  Service: Cardiovascular;;   IR ANGIO INTRA EXTRACRAN SEL COM CAROTID INNOMINATE BILAT MOD SED  06/02/2020   IR ANGIO VERTEBRAL SEL SUBCLAVIAN INNOMINATE BILAT MOD SED  06/02/2020   TEE WITHOUT CARDIOVERSION N/A 06/01/2020   Procedure: TRANSESOPHAGEAL ECHOCARDIOGRAM (TEE);  Surgeon: Donato Heinz, MD;  Location: [facility] ENDOSCOPY;  Service: Cardiovascular;  Laterality: N/A;   Social History:  reports that he has quit smoking. He has never used smokeless tobacco. He reports previous alcohol use. He reports that he does not use drugs.  Family / Support Systems Marital Status: Single Spouse/Significant Other: [name] Other Supports: Ellison Hughs (Sister) Anticipated Caregiver: Elenora Fender [phone] Ability/Limitations of Caregiver: Works from [employer] Caregiver Availability: Intermittent  Social History Preferred language: English Religion: Christian Read: Yes Write: Yes Legal History/Current Legal Issues: n/a Guardian/Conservator: n/a   Abuse/Neglect Abuse/Neglect Assessment Can Be Completed: Yes Physical Abuse: Denies Verbal Abuse: Denies Sexual Abuse: Denies Exploitation of patient/patient's resources: Denies Self-Neglect: Denies  Emotional Status Recent Psychosocial Issues: n/a Psychiatric History: n/a Substance Abuse History: Tobacco  Patient / Family Perceptions, Expectations &  Goals Pt/Family understanding of illness & functional limitations: yes Premorbid pt/family roles/activities: Indpendent previous to April 2022. Limited in community 1-2 days a week Anticipated changes in roles/activities/participation: Girlfriend able to assist some Pt/family expectations/goals: MOD I to [place]: None Premorbid Home Care/DME Agencies: None Transportation available at discharge: Family able to transport Resource referrals recommended: Neuropsychology  Discharge Planning Living Arrangements: Spouse/significant other Support Systems: Spouse/significant other Type of Residence: Private residence (2 level home, 6 steps) Insurance Resources: Teacher, adult education Screen Referred: Yes Living Expenses: Rent Does the patient have any problems obtaining your medications?: Yes (Describe) (MATCH) Home Management: Independent Patient/Family Preliminary Plans: S.O able to assist Care Coordinator Barriers to Discharge: Other (comments), Insurance for SNF coverage Care Coordinator Barriers to Discharge Comments: Patient insured cannot guarentee all follow up services Care Coordinator Anticipated Follow Up Needs: HH/OP Expected length of stay: 10-13 Days  Clinical Impression Sw met with patient and parents in room. Introduced self, explained role and addressed questions and concerns. Patient and mother would like pt sister Ellison Hughs) to be primary contact for information, second pt S.O. Family would like pt S.O to attend future scheduled family education. Pt plans on returning home with his S.O. Patient uninsured, explained to pt and family current barriers. No additional questions or concerns, sw will continue to follow up.    Dyanne Iha 06/24/2020, 12:26 PM

## 2020-06-24 NOTE — Progress Notes (Signed)
Patient ID: Nathaniel Spears, male   DOB: 05-Jul-1975, 45 y.o.   MRN: [id number] Met with the patient to introduce self and the role of  the nurse CM. Reviewed history of recurrent strokes and secondary risk factors including HTN, HLD (LDL 39) and DM (A1C 10.3) Patient with expressive aphasia and although he only answered :"yes": repeatedly was able to discern that he was not following a CMM diet at home; eating a lot of sweets without exercising. Reviewed medications he was taking PTA for DM management and discussed CMM, DASH diet modifications and given handouts for both. Also reviewed DAPT w Brilinta then Plavix per MD. Patient noted an understanding of the information reviewed. Continue to follow along to discharge to address educational needs and collaborate with the SW to facilitate preparation for discharge. Margarito Liner, RN

## 2020-06-24 NOTE — Evaluation (Signed)
Occupational Therapy Assessment and Plan  Patient Details  Name: Nathaniel Spears MRN: [id number] Date of Birth: 1975-06-30  OT Diagnosis: weakness on non dominant side, cognitive impairments, generalized weakness, vision disturbance,  Rehab Potential:   ELOS: 1-2 weeks   Today's Date: 06/24/2020 OT Individual Time: 1749-4496 OT Individual Time Calculation (min): 75 min     Hospital Problem: Principal Problem:   Acute ischemic stroke [facility]) Active Problems:   Middle cerebral artery embolism, bilateral   Past Medical History:  Past Medical History:  Diagnosis Date   Hypertension    Stroke [facility])    Past Surgical History:  Past Surgical History:  Procedure Laterality Date   BUBBLE STUDY  06/01/2020   Procedure: BUBBLE STUDY;  Surgeon: Donato Heinz, MD;  Location: [place];  Service: Cardiovascular;;   IR ANGIO INTRA EXTRACRAN SEL COM CAROTID INNOMINATE BILAT MOD SED  06/02/2020   IR ANGIO VERTEBRAL SEL SUBCLAVIAN INNOMINATE BILAT MOD SED  06/02/2020   TEE WITHOUT CARDIOVERSION N/A 06/01/2020   Procedure: TRANSESOPHAGEAL ECHOCARDIOGRAM (TEE);  Surgeon: Donato Heinz, MD;  Location: [facility] ENDOSCOPY;  Service: Cardiovascular;  Laterality: N/A;    Assessment & Plan Clinical Impression: Patient is a 45 y.o. year old male with history of hyperlipidemia, hypertension, diabetes mellitus, tobacco use.  Per chart review lives with spouse.  Two-level home 6 steps to entry.  Prior to initial CVA of April 2022 patient independent in all tasks.  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  He had received work-up at [facility] per neurology.  Current work-up was negative for vasculitis.  He returned to [facility] 06/15/2020 with word finding deficits.  MRI of the brain showed several new or increased acute infarcts in both cerebral hemispheres.  CTA of the head/ neck showed no large vessel occlusion.  Unchanged multifocal  severe intracranial stenosis.  EEG was negative for seizure but did suggest cortical dysfunction in the right hemisphere secondary to underlying stroke.  It also showed diffuse encephalopathy nonspecific in etiology.  Maintained on Keppra for seizure prophylaxis.  Most recent echocardiogram with ejection fraction of 60 to 65% no wall motion abnormalities.  Patient was placed on aspirin and Plavix as well as statin prior to arrival to [facility].  He was changed to aspirin 81 mg and Brilinta 90 mg BID that he would continue for 30 days then ASA 325 mg and PLAVIX 75 mg daily for another 2 months and then PLAVIX alone.  Lovenox initiated for DVT prophylaxis.  Tolerating a regular consistency diet.  Therapy evaluations completed recommendations of physical medicine rehab consult due to patient's aphasia, gait disorder sensory cognitive balance deficits was admitted for a comprehensive rehab program.   Patient transferred to CIR on 06/23/2020 .    Patient currently requires mod with basic self-care skills and IADL secondary to muscle weakness, impaired timing and sequencing, decreased coordination, and decreased motor planning, decreased visual motor skills, decreased initiation, decreased problem solving, decreased safety awareness, and delayed processing, and decreased standing balance and hemiplegia.   Patient will benefit from skilled intervention to decrease level of assist with basic self-care skills, increase independence with basic self-care skills, and increase level of independence with iADL prior to discharge home with care partner.  Anticipate patient will require 24 hour supervision and follow up with OP OT .  OT - End of Session Activity Tolerance: Tolerates 30+ min activity with multiple rests Endurance Deficit: Yes Endurance Deficit Description: fatigue with adl  tasks, standing tolerance 2-3 minutes OT Assessment OT Patient demonstrates impairments in the following area(s):  Balance;Cognition;Endurance;Motor;Safety;Vision OT Basic ADL's Functional Problem(s): Eating;Grooming;Bathing;Dressing;Toileting OT Advanced ADL's Functional Problem(s): Simple Meal Preparation OT Transfers Functional Problem(s): Toilet;Tub/Shower OT Additional Impairment(s): Fuctional Use of Upper Extremity OT Plan OT Intensity: Minimum of 1-2 x/day, 45 to 90 minutes OT Frequency: 5 out of 7 days OT Duration/Estimated Length of Stay: 1-2 weeks OT Treatment/Interventions: Balance/vestibular training;Neuromuscular re-education;Self Care/advanced ADL retraining;Cognitive remediation/compensation;DME/adaptive equipment instruction;UE/LE Strength taining/ROM;Community reintegration;Patient/family education;UE/LE Coordination activities;Discharge planning;Functional mobility training;Therapeutic Activities;Visual/perceptual remediation/compensation OT Self Feeding Anticipated Outcome(s): set up/mod I OT Basic Self-Care Anticipated Outcome(s): supervision OT Toileting Anticipated Outcome(s): supervision OT Bathroom Transfers Anticipated Outcome(s): supervision OT Recommendation Patient destination: Home Follow Up Recommendations: Outpatient OT Equipment Recommended: To be determined   OT Evaluation Precautions/Restrictions  Precautions Precautions: Fall Precaution Comments: expressive and receptive aphasia Restrictions Weight Bearing Restrictions: No General   Vital Signs  Pain Pain Assessment Pain Scale: 0-10 Pain Score: 0-No pain Home Living/Prior Functioning Home Living Family/patient expects to be discharged to:: Private residence Living Arrangements: Spouse/significant other Available Help at Discharge: Other (Comment), Available PRN/intermittently Type of Home: Apartment Entrance Stairs-Number of Steps: 6-7 Bathroom Shower/Tub: Chiropodist: Standard  Lives With: Significant other Vision Vision Assessment?: Yes Eye Alignment: Within Functional  Limits Ocular Range of Motion: Within Functional Limits Alignment/Gaze Preference: Within Defined Limits Tracking/Visual Pursuits: Able to track stimulus in all quads without difficulty Convergence: Within functional limits Visual Fields: Right visual field deficit Perception  Perception: Within Functional Limits (able to position clothing appropriately) Praxis Praxis: Impaired Praxis Impairment Details: Initiation;Motor planning;Perseveration Cognition Overall Cognitive Status: Impaired/Different from baseline Arousal/Alertness: Awake/alert Orientation Level: Person;Place;Situation Person: Oriented Situation: Oriented Year: 2022 (with written choice of 2) Month: April Day of Week: Incorrect Immediate Memory Recall: Sock;Blue;Bed Memory Recall Sock: Not able to recall Memory Recall Blue: Not able to recall Memory Recall Bed: Not able to recall Problem Solving: Impaired Executive Function: Sequencing;Initiating Sequencing: Impaired Organizing: Impaired Comments: no impulsive behaviors, pleasant and cooperative Sensation Sensation Light Touch: Appears Intact Hot/Cold: Appears Intact Proprioception: Appears Intact Coordination Gross Motor Movements are Fluid and Coordinated: Yes Fine Motor Movements are Fluid and Coordinated: Yes Finger Nose Finger Test: able to reach for objects without difficulty but unable to follow directions for finger to nose 9 Hole Peg Test: able to pick up, manipulate and place pegs but unable to follow directions for full assessment/timing Motor  Motor Motor: Hemiplegia Motor - Skilled Clinical Observations: difficulty with transition of task from left to right hand, mild proximal weakness  Trunk/Postural Assessment  Cervical Assessment Cervical Assessment: Within Functional Limits Thoracic Assessment Thoracic Assessment: Within Functional Limits Lumbar Assessment Lumbar Assessment: Within Functional Limits Postural Control Postural Control:  Within Functional Limits  Balance Balance Balance Assessed: Yes Static Sitting Balance Static Sitting - Level of Assistance: 5: Stand by assistance Dynamic Sitting Balance Dynamic Sitting - Level of Assistance: 5: Stand by assistance Static Standing Balance Static Standing - Level of Assistance: 4: Min assist Dynamic Standing Balance Dynamic Standing - Level of Assistance: 4: Min assist Extremity/Trunk Assessment RUE Assessment RUE Assessment: Within Functional Limits General Strength Comments: 4/5 proximal, 5/5 distal LUE Assessment LUE Assessment: Within Functional Limits General Strength Comments: 5/5  Care Tool Care Tool Self Care Eating   Eating Assist Level: Maximal Assistance - Patient 25 - 49%    Oral Care    Oral Care Assist Level: Minimal Assistance - Patient > 75%    Bathing   Body  parts bathed by patient: Right arm;Left arm;Chest;Abdomen;Front perineal area;Right upper leg;Left upper leg;Face Body parts bathed by helper: Right arm;Left arm;Chest;Abdomen;Front perineal area;Buttocks;Right upper leg;Left upper leg;Right lower leg;Left lower leg   Assist Level: Total Assistance - Patient < 25%    Upper Body Dressing(including orthotics)   What is the patient wearing?: Pull over shirt   Assist Level: Minimal Assistance - Patient > 75%    Lower Body Dressing (excluding footwear)   What is the patient wearing?: Incontinence brief;Pants Assist for lower body dressing: Moderate Assistance - Patient 50 - 74%    Putting on/Taking off footwear   What is the patient wearing?: Ted hose;Shoes Assist for footwear: Total Assistance - Patient < 25%       Care Tool Toileting Toileting activity         Care Tool Bed Mobility Roll left and right activity        Sit to lying activity        Lying to sitting edge of bed activity         Care Tool Transfers Sit to stand transfer        Chair/bed transfer         Toilet transfer         Care Tool  Cognition Expression of Ideas and Wants     Understanding Verbal and Non-Verbal Content     Memory/Recall Ability *first 3 days only      Refer to Care Plan for Long Term Goals  SHORT TERM GOAL WEEK 1 OT Short Term Goal 1 (Week 1): patient will complete functional transfers and bed mobility with CS OT Short Term Goal 2 (Week 1): patient will complete adl tasks/grooming/bathing with min A and min cues for initiation, sequencing OT Short Term Goal 3 (Week 1): patient will complete basic meal prep 2-3 step task with CS and mod cues OT Short Term Goal 4 (Week 1): patient will scan right side of environment with min cues  Recommendations for other services: None    Skilled Therapeutic Intervention  Patient in bed, alert and cooperative.  Nursing assisting with breakfast.  Reviewed role of OT and evaluation process.    Patient continued to require max A for completion of breakfast meal - able to bring bring fork to mouth but having difficulty with initiating spearing and scooping.  Evaluation completed as documented above.  Patient presents with motor impairments, initiation, sequencing, perseveration, right side field or inattention deficit, cognitive, expressive/receptive language impairment.  He demonstrates good core strength and mobility in arms and legs, is an excellent candidate for IP rehab to maximize independence and facilitate a safe return to home with family.  He participated in adl training and mobility training as documented below and will benefit from ongoing repetition of adl and iadl tasks.  He returned to bed at close of session, bed alarm set and call bell in hand.    ADL ADL Eating: Maximal assistance Where Assessed-Eating: Bed level Grooming: Minimal assistance Where Assessed-Grooming: Edge of bed;Standing at sink Upper Body Bathing: Maximal assistance Where Assessed-Upper Body Bathing: Edge of bed Lower Body Bathing: Maximal assistance Where Assessed-Lower Body  Bathing: Edge of bed Upper Body Dressing: Minimal assistance Where Assessed-Upper Body Dressing: Edge of bed Lower Body Dressing: Moderate assistance Where Assessed-Lower Body Dressing: Edge of bed Toileting: Moderate assistance Toilet Transfer: Minimal assistance Toilet Transfer Method: Ambulating ADL Comments: patient with impaired initiation, sequencing and perseveration Mobility  Bed Mobility Bed Mobility: Supine to Sit;Sit to Supine  Supine to Sit: Minimal Assistance - Patient > 75% Sit to Supine: Minimal Assistance - Patient > 75% Transfers Sit to Stand: Supervision/Verbal cueing Stand to Sit: Supervision/Verbal cueing   Discharge Criteria: Patient will be discharged from OT if patient refuses treatment 3 consecutive times without medical reason, if treatment goals not met, if there is a change in medical status, if patient makes no progress towards goals or if patient is discharged from hospital.  The above assessment, treatment plan, treatment alternatives and goals were discussed and mutually agreed upon: by patient  Carlos Levering 06/24/2020, 9:50 AM

## 2020-06-24 NOTE — Evaluation (Signed)
Physical Therapy Assessment and Plan  Patient Details  Name: Nathaniel Spears MRN: [id number] Date of Birth: Feb 22, 1975  PT Diagnosis: Abnormality of gait, Cognitive deficits, Difficulty walking, Impaired cognition, and Muscle weakness Rehab Potential: Good ELOS: 10-14 days   Today's Date: 06/24/2020 PT Individual Time: 8295-6213 PT Individual Time Calculation (min): 74 min    Hospital Problem: Principal Problem:   Acute ischemic stroke [facility]) Active Problems:   Middle cerebral artery embolism, bilateral   Past Medical History:  Past Medical History:  Diagnosis Date   Hypertension    Stroke [facility])    Past Surgical History:  Past Surgical History:  Procedure Laterality Date   BUBBLE STUDY  06/01/2020   Procedure: BUBBLE STUDY;  Surgeon: Donato Heinz, MD;  Location: [place];  Service: Cardiovascular;;   IR ANGIO INTRA EXTRACRAN SEL COM CAROTID INNOMINATE BILAT MOD SED  06/02/2020   IR ANGIO VERTEBRAL SEL SUBCLAVIAN INNOMINATE BILAT MOD SED  06/02/2020   TEE WITHOUT CARDIOVERSION N/A 06/01/2020   Procedure: TRANSESOPHAGEAL ECHOCARDIOGRAM (TEE);  Surgeon: Donato Heinz, MD;  Location: [facility] END[facility];  Service: Cardiovascular;  Laterality: N/A;    Assessment & Plan Clinical Impression: Patient is a 45 year old right-handed male with history of hyperlipidemia, hypertension, diabetes mellitus, tobacco use.  Per chart review lives with spouse.  Two-level home 6 steps to entry.  Prior to initial CVA of April 2022 patient independent in all tasks.  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  He had received work-up at [facility] per neurology.  Current work-up was negative for vasculitis.  He returned to [facility] 06/15/2020 with word finding deficits.  MRI of the brain showed several new or increased acute infarcts in both cerebral hemispheres.  CTA of the head/ neck showed no large vessel occlusion.  Unchanged  multifocal severe intracranial stenosis.  EEG was negative for seizure but did suggest cortical dysfunction in the right hemisphere secondary to underlying stroke.  It also showed diffuse encephalopathy nonspecific in etiology.  Maintained on Keppra for seizure prophylaxis.  Most recent echocardiogram with ejection fraction of 60 to 65% no wall motion abnormalities.  Patient was placed on aspirin and Plavix as well as statin prior to arrival to [facility].  He was changed to aspirin 81 mg and Brilinta 90 mg BID that he would continue for 30 days then ASA 325 mg and PLAVIX 75 mg daily for another 2 months and then PLAVIX alone.  Lovenox initiated for DVT prophylaxis.  Tolerating a regular consistency diet.  Therapy evaluations completed recommendations of physical medicine rehab consult due to patient's aphasia, gait disorder sensory cognitive balance deficits was admitted for a comprehensive rehab program.Patient transferred to CIR on 06/23/2020 .   Patient currently requires min with mobility secondary to muscle weakness, decreased cardiorespiratoy endurance, motor apraxia and decreased motor planning, decreased initiation, decreased attention, decreased awareness, decreased problem solving, decreased safety awareness, and delayed processing, and decreased standing balance, hemiplegia, and decreased balance strategies.  Prior to hospitalization, patient was independent  with mobility and lived with Significant other (girlfriend - [name]) in a [place] home.  Home access is 6-7Stairs to enter.  Patient will benefit from skilled PT intervention to maximize safe functional mobility, minimize fall risk, and decrease caregiver burden for planned discharge home with intermittent assist.  Anticipate patient will  HHPT vs OPPT pending progress and access to services  at discharge.  PT - End of Session Activity Tolerance: Tolerates 30+ min activity with  multiple rests Endurance Deficit: Yes Endurance  Deficit Description: Fatigue wtih functional mobility tasks, benefits from brief seated rest breaks. PT Assessment Rehab Potential (ACUTE/IP ONLY): Good PT Barriers to Discharge: Decreased caregiver support;Insurance for SNF coverage;Home environment access/layout;Lack of/limited family support;Weight;Other (comments) PT Barriers to Discharge Comments: Expressive and receptive aphasia PT Patient demonstrates impairments in the following area(s): Balance;Endurance;Motor;Safety PT Transfers Functional Problem(s): Bed Mobility;Bed to Chair;Furniture PT Locomotion Functional Problem(s): Ambulation;Stairs PT Plan PT Intensity: Minimum of 1-2 x/day ,45 to 90 minutes PT Frequency: 5 out of 7 days PT Duration Estimated Length of Stay: 10-14 days PT Treatment/Interventions: Ambulation/gait training;Balance/vestibular training;Cognitive remediation/compensation;Community reintegration;Patient/family education;Stair training;UE/LE Coordination activities;UE/LE Strength taining/ROM;Splinting/orthotics;Pain management;DME/adaptive equipment instruction;Disease management/prevention;Neuromuscular re-education;Skin care/wound management;Therapeutic Exercise;Wheelchair propulsion/positioning;Therapeutic Activities;Visual/perceptual remediation/compensation;Functional mobility training;Psychosocial support;Discharge planning PT Transfers Anticipated Outcome(s): supervision PT Locomotion Anticipated Outcome(s): supervision PT Recommendation Follow Up Recommendations: 24 hour supervision/assistance Patient destination: Home Equipment Recommended: To be determined   PT Evaluation Precautions/Restrictions Precautions Precautions: Fall Precaution Comments: expressive and receptive aphasia Restrictions Weight Bearing Restrictions: No Home Living/Prior Functioning Home Living Living Arrangements: Spouse/significant other Available Help at Discharge: Other (Comment);Available PRN/intermittently Type of Home:  Apartment Home Access: Stairs to enter Entrance Stairs-Number of Steps: 6-7 Entrance Stairs-Rails: None Home Layout: Two level Alternate Level Stairs-Number of Steps: 13 Alternate Level Stairs-Rails: Right;Left;Can reach both Bathroom Shower/Tub: Chiropodist: Standard  Lives With: Significant other (girlfriend - Immunologist) Prior Function Level of Independence: Independent with gait;Independent with transfers  Able to Take Stairs?: Yes Driving: No Vocation: Unemployed Comments: Pt unable to provide social factors or PLOF. Family present at end of session to assist with details. Vision/Perception  Vision - Assessment Eye Alignment: Within Functional Limits Ocular Range of Motion: Within Functional Limits Alignment/Gaze Preference: Within Defined Limits Tracking/Visual Pursuits: Able to track stimulus in all quads without difficulty Convergence: Within functional limits Perception Perception: Within Functional Limits Praxis Praxis: Impaired Praxis Impairment Details: Initiation;Motor planning;Perseveration  Cognition Overall Cognitive Status: Impaired/Different from baseline Arousal/Alertness: Awake/alert Orientation Level: Oriented to person;Other (comment) (aphasia limiting) Immediate Memory Recall: Sock;Blue;Bed Memory Recall Sock: Not able to recall Memory Recall Blue: Not able to recall Memory Recall Bed: Not able to recall Problem Solving: Impaired Executive Function: Sequencing;Initiating Sequencing: Impaired Organizing: Impaired Safety/Judgment: Impaired Comments: no impulsive behaviors, pleasant and cooperative Sensation Sensation Light Touch: Appears Intact Hot/Cold: Appears Intact Proprioception: Appears Intact Coordination Gross Motor Movements are Fluid and Coordinated: Yes Fine Motor Movements are Fluid and Coordinated: Yes Finger Nose Finger Test: able to reach for objects without difficulty but unable to follow directions for finger to  nose 9 Hole Peg Test: able to pick up, manipulate and place pegs but unable to follow directions for full assessment/timing Motor  Motor Motor: Hemiplegia;Motor impersistence;Motor perseverations Motor - Skilled Clinical Observations: difficulty with transition of task from left to right hand, mild proximal weakness   Trunk/Postural Assessment  Cervical Assessment Cervical Assessment: Within Functional Limits Thoracic Assessment Thoracic Assessment: Within Functional Limits Lumbar Assessment Lumbar Assessment: Within Functional Limits Postural Control Postural Control: Within Functional Limits  Balance Balance Balance Assessed: Yes Static Sitting Balance Static Sitting - Level of Assistance: 5: Stand by assistance Dynamic Sitting Balance Dynamic Sitting - Level of Assistance: 5: Stand by assistance Static Standing Balance Static Standing - Balance Support: No upper extremity supported Static Standing - Level of Assistance: 4: Min assist Dynamic Standing Balance Dynamic Standing - Balance Support: During functional activity;No upper extremity supported Dynamic Standing - Level of Assistance: 4: Min assist Extremity Assessment  RUE Assessment RUE  Assessment: Within Functional Limits General Strength Comments: 4/5 proximal, 5/5 distal LUE Assessment LUE Assessment: Within Functional Limits General Strength Comments: 5/5 RLE Assessment RLE Assessment: Exceptions to [facility] General Strength Comments: Unable to assess with formal MMT due to cognitive/aphasia deficits. Functionally WFL LLE Assessment LLE Assessment: Exceptions to [facility] General Strength Comments: Unable to assess with formal MMT due to cognitive/aphasia deficits. Functionally [facility]  Care Tool Care Tool Bed Mobility Roll left and right activity   Roll left and right assist level: Contact Guard/Touching assist    Sit to lying activity   Sit to lying assist level: Contact Guard/Touching assist    Lying to sitting edge  of bed activity   Lying to sitting edge of bed assist level: Contact Guard/Touching assist     Care Tool Transfers Sit to stand transfer   Sit to stand assist level: Contact Guard/Touching assist    Chair/bed transfer   Chair/bed transfer assist level: Minimal Assistance - Patient > 75%     Physiological scientist transfer assist level: Minimal Assistance - Patient > 75%      Care Tool Locomotion Ambulation   Assist level: Minimal Assistance - Patient > 75% Assistive device: No Device Max distance: 150'  Walk 10 feet activity   Assist level: Minimal Assistance - Patient > 75% Assistive device: No Device   Walk 50 feet with 2 turns activity   Assist level: Minimal Assistance - Patient > 75% Assistive device: No Device  Walk 150 feet activity   Assist level: Minimal Assistance - Patient > 75% Assistive device: No Device  Walk 10 feet on uneven surfaces activity   Assist level: Minimal Assistance - Patient > 75% Assistive device: Other (comment) (no Device)  Stairs   Assist level: Minimal Assistance - Patient > 75% Stairs assistive device: 2 hand rails Max number of stairs: 12  Walk up/down 1 step activity   Walk up/down 1 step (curb) assist level: Minimal Assistance - Patient > 75% Walk up/down 1 step or curb assistive device: 2 hand rails    Walk up/down 4 steps activity Walk up/down 4 steps assist level: Minimal Assistance - Patient > 75% Walk up/down 4 steps assistive device: 2 hand rails  Walk up/down 12 steps activity   Walk up/down 12 steps assist level: Minimal Assistance - Patient > 75% Walk up/down 12 steps assistive device: 2 hand rails  Pick up small objects from floor Pick up small object from the floor (from standing position) activity did not occur: Safety/medical concerns      Wheelchair Will patient use wheelchair at discharge?: No   Wheelchair activity did not occur: N/A      Wheel 50 feet with 2 turns activity Wheelchair 50  feet with 2 turns activity did not occur: N/A    Wheel 150 feet activity Wheelchair 150 feet activity did not occur: N/A      Refer to Care Plan for Long Term Goals  SHORT TERM GOAL WEEK 1 PT Short Term Goal 1 (Week 1): Pt will complete bed mobility with supervision PT Short Term Goal 2 (Week 1): Pt will completed bed<>chair transfers with CGA consistently PT Short Term Goal 3 (Week 1): Pt will ambulate 168f with CGA and LRAD PT Short Term Goal 4 (Week 1): Pt will negotiated up/down 12 steps with CGA and LRAD  Recommendations for other services: None   Skilled Therapeutic Intervention Mobility Bed Mobility Bed Mobility: Supine to  Sit;Sit to Supine Supine to Sit: Contact Guard/Touching assist Sit to Supine: Contact Guard/Touching assist Transfers Transfers: Sit to Stand;Stand to Sit;Stand Pivot Transfers Sit to Stand: Contact Guard/Touching assist Stand to Sit: Contact Guard/Touching assist Stand Pivot Transfers: Contact Guard/Touching assist Transfer (Assistive device): None Locomotion  Gait Ambulation: Yes Gait Assistance: Minimal Assistance - Patient > 75% Gait Distance (Feet): 150 Feet Assistive device: None Gait Assistance Details: Verbal cues for technique;Verbal cues for sequencing;Verbal cues for precautions/safety;Verbal cues for gait pattern Gait Gait: Yes Gait Pattern: Impaired Gait Pattern: Step-through pattern;Decreased step length - right;Decreased step length - left;Trunk flexed;Wide base of support (arms in high guard position with limited arm swing) Gait velocity: decreased Stairs / Additional Locomotion Stairs: Yes Stairs Assistance: Minimal Assistance - Patient > 75% Stair Management Technique: Two rails Number of Stairs: 12 Height of Stairs: 6 Ramp: Minimal Assistance - Patient >75% Curb: Minimal Assistance - Patient >75% Wheelchair Mobility Wheelchair Mobility: No  Skilled Intervention: Pt greeted seated in recliner with safety belt alarm on.  Pt pleasant and cooperative throughout session. He's oriented to self only however expressive > aphasia impacting. Initiated functional mobility as outlined above. Grossly, he requires CGA/minA for all functional mobility. 62mter gait speed = 0.45 m/s, indicative of limited community ambulator. Pt ambulates with arms in high guard position and decreased bilateral step length - no formal LOB or knee buckling but unsteadiness present. Pt continent of bowel & bladder during session. Noted that while washing hands at sink - pt unclear on how to turn water on or use soap dispenser. He was able to perform furniture transfers with CGA/minA as well. Pt is a great candidate for CIR and anticipate he will recover well but anticipate need for 24/7 supervision due to significant aphasia.   Instructed pt in results of PT evaluation as detailed above, PT POC, rehab potential, rehab goals, and discharge recommendations. Additionally discussed CIR's policies regarding fall safety and use of chair alarm and/or quick release belt. Pt verbalized understanding and in agreement. Will update pt's family members as they become available.   Discharge Criteria: Patient will be discharged from PT if patient refuses treatment 3 consecutive times without medical reason, if treatment goals not met, if there is a change in medical status, if patient makes no progress towards goals or if patient is discharged from hospital.  The above assessment, treatment plan, treatment alternatives and goals were discussed and mutually agreed upon: by patient  CAlger SimonsPT, DPT 06/24/2020, 12:29 PM

## 2020-06-24 NOTE — Evaluation (Signed)
Speech Language Pathology Assessment and Plan  Patient Details  Name: Nathaniel Spears MRN: [id number] Date of Birth: 1975/02/09  SLP Diagnosis: Aphasia  Rehab Potential: Excellent ELOS: 10-14 days    Today's Date: 06/24/2020 SLP Individual Time: 1300-1400 SLP Individual Time Calculation (min): 60 min   Hospital Problem: Principal Problem:   Acute ischemic stroke [facility]) Active Problems:   Middle cerebral artery embolism, bilateral  Past Medical History:  Past Medical History:  Diagnosis Date   Hypertension    Stroke [facility])    Past Surgical History:  Past Surgical History:  Procedure Laterality Date   BUBBLE STUDY  06/01/2020   Procedure: BUBBLE STUDY;  Surgeon: Donato Heinz, MD;  Location: [place];  Service: Cardiovascular;;   IR ANGIO INTRA EXTRACRAN SEL COM CAROTID INNOMINATE BILAT MOD SED  06/02/2020   IR ANGIO VERTEBRAL SEL SUBCLAVIAN INNOMINATE BILAT MOD SED  06/02/2020   TEE WITHOUT CARDIOVERSION N/A 06/01/2020   Procedure: TRANSESOPHAGEAL ECHOCARDIOGRAM (TEE);  Surgeon: Donato Heinz, MD;  Location: [facility] ENDOSCOPY;  Service: Cardiovascular;  Laterality: N/A;    Assessment / Plan / Recommendation Clinical Impression Nathaniel Spears is a 45 year old right-handed male with history of hyperlipidemia, hypertension, diabetes mellitus, tobacco use.  Per chart review lives with spouse.  Two-level home 6 steps to entry.  Prior to initial CVA of April 2022 patient independent in all tasks.  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  He had received work-up at [facility] per neurology.  Current work-up was negative for vasculitis.  He returned to [facility] 06/15/2020 with word finding deficits.  MRI of the brain showed several new or increased acute infarcts in both cerebral hemispheres.  CTA of the head/ neck showed no large vessel occlusion.  Unchanged multifocal severe intracranial stenosis.  EEG was  negative for seizure but did suggest cortical dysfunction in the right hemisphere secondary to underlying stroke.  It also showed diffuse encephalopathy nonspecific in etiology.  Maintained on Keppra for seizure prophylaxis.  Most recent echocardiogram with ejection fraction of 60 to 65% no wall motion abnormalities.  Patient was placed on aspirin and Plavix as well as statin prior to arrival to [facility].  He was changed to aspirin 81 mg and Brilinta 90 mg BID that he would continue for 30 days then ASA 325 mg and PLAVIX 75 mg daily for another 2 months and then PLAVIX alone.  Lovenox initiated for DVT prophylaxis.  Tolerating a regular consistency diet.  Therapy evaluations completed recommendations of physical medicine rehab consult due to patient's aphasia, gait disorder sensory cognitive balance deficits was admitted for a comprehensive rehab program.  [name] presents with moderate-to-severe language impairments consistent with both expressive and receptive non-fluent aphasia. Patient presents with history of multiple strokes starting in April 2022 with residual language deficits, in which he was receiving prior SLP services on an outpatient basis. [name] mother and father were present at bedside and endorsed increased word finding difficulty following most recent stroke.  SLP administered the Western Aphasia Battery Bedside (WAB) with the following scores:  - Spontaneous speech verbal content: 4/10  - Verbal fluency: 4/10 - Auditory verbal comprehension Yes/No Questions: 6/10 - Following Sequential Commands: 0/10 (improvement with modified verbal prompt).  - Verbal repetition: 6/10 - Object Naming: 4/10 - Reading and Writing not assessed due to time constraints  Spontaneous speech can be characterized as agrammatic, effortful, significant word finding difficulty and word searching behavior ("uh"), pausing, and was only  able to produce single words and short phrases in response to  simple questions. Patient was stimulable to phonemic, semantic, first letter, and cloze phrase cues to improve word finding. Patient benefited from verbal repetition of questions and commands and may benefit from use of Y/N visual aid for complex yes/no questions. Suspect difficulty with object identification may be attributed to comprehension deficits in addition to difficulty with motor planning and possible visual deficits (R visual field). Patient benefited from handing objects to therapist instead of pointing (e.g., "hand me the ___"), and reducing the field (2-3 items) for visual scanning. SLP was unable to assess reading and writing due to time constraints. It is recommended this be assessed during following session. It was difficult to assess cognitive functions, however patient was oriented x4, displayed appropriate pragmatics, and was able to attend to therapist during duration of evaluation with minimal distractibility. Patient was known to fatigue by end of session. Further assessment may be beneficial. SLP provided education to patient and family on results evaluation and expressive and receptive language deficits. Patient would benefit from skilled SLP services to maximize communication, socialization, and overall functional independence    Skilled Therapeutic Interventions          SLP administered language assessment (Western Aphasia Battery Bedside). Educated family on results of evaluation, current communication deficits, and communication strategies.    SLP Assessment  Patient will need skilled [facility] during CIR admission    Recommendations  Patient destination: Home Follow up Recommendations: 24 hour supervision/assistance;Outpatient SLP Equipment Recommended: None recommended by SLP    SLP Frequency 3 to 5 out of 7 days   SLP Duration  SLP Intensity  SLP Treatment/Interventions 10-14 days  Minumum of 1-2 x/day, 30 to 90  minutes  Internal/external aids;Speech/Language facilitation;Cueing hierarchy;Functional tasks;Patient/family education    Pain Pain Assessment Pain Scale: 0-10 Pain Score: 0-No pain  Prior Functioning Type of Home: Apartment  Lives With: Significant other (girlfriend - [name]) Available Help at Discharge: Other (Comment);Available PRN/intermittently Vocation: Unemployed  SLP Evaluation Cognition Overall Cognitive Status: Difficult to assess (difficulty to assess secondary to severity of aphasia) Arousal/Alertness: Awake/alert Orientation Level: Oriented to person;Oriented to place (limited due to aphasia) Problem Solving: Impaired Sequencing: Impaired Safety/Judgment: Impaired Comments: no impulsive behaviors, pleasant and cooperative  Comprehension Auditory Comprehension Overall Auditory Comprehension: Impaired Yes/No Questions: Impaired Basic Biographical Questions: 51-75% accurate Complex Questions: 50-74% accurate Commands: Impaired One Step Basic Commands: 50-74% accurate Conversation: Simple Other Conversation Comments: benefited from repetition of questions during conversation EffectiveTechniques: Extra processing time;Repetition;Slowed speech Visual Recognition/Discrimination Common Objects: Able in field of 2 (with Mod-to-Max A) Reading Comprehension Reading Status: Not tested Expression Expression Primary Mode of Expression: Verbal Verbal Expression Overall Verbal Expression: Impaired Initiation: Impaired Automatic Speech: Social Response Level of Generative/Spontaneous Verbalization: Phrase Repetition: Impaired Level of Impairment: Phrase level Naming: Impairment Responsive: 26-50% accurate Confrontation: Impaired Common Objects: Able in field of 2 (with Mod-to-Max A) Verbal Errors: Aware of errors;Semantic paraphasias;Phonemic paraphasias Pragmatics: No impairment Effective Techniques: Sentence completion;Phonemic cues Written Expression Written  Expression: Not tested    Care Tool Care Tool Cognition Expression of Ideas and Wants Expression of Ideas and Wants: Frequent difficulty - frequently exhibits difficulty with expressing needs and ideas   Understanding Verbal and Non-Verbal Content Understanding Verbal and Non-Verbal Content: Sometimes understands - understands only basic conversations or simple, direct phrases. Frequently requires cues to understand   Memory/Recall Ability *first 3 days only Memory/Recall Ability *first 3 days only: None of the above  were recalled    Short Term Goals: Week 1: SLP Short Term Goal 1 (Week 1): Patient will follow 1-step directions related to functional environment with 50% accuracy and Mod A verbal and visual cues SLP Short Term Goal 2 (Week 1): Patient will identify objects/object pictures within field of three with 50% accuracy and Mod A verbal and visual cues SLP Short Term Goal 3 (Week 1): Patient will verbally name relevant objects/pictures/people with 50% accuracy and Max A verbal and visual cues  (phonemic, first letter, and sentence completion cues.) SLP Short Term Goal 4 (Week 1): Patient will utilize multimodal communication Passenger transport manager, writing?) during 4/5 opportunities with Mod A verbal and visual cues  Refer to Care Plan for Long Term Goals  Recommendations for other services: None   Discharge Criteria: Patient will be discharged from SLP if patient refuses treatment 3 consecutive times without medical reason, if treatment goals not met, if there is a change in medical status, if patient makes no progress towards goals or if patient is discharged from hospital.  The above assessment, treatment plan, treatment alternatives and goals were discussed and mutually agreed upon: by patient  Patty Sermons 06/24/2020, 3:49 PM

## 2020-06-24 NOTE — Progress Notes (Signed)
PROGRESS NOTE   Subjective/Complaints: No complaints this morning. May d/c IV Denies pain, constipation Did well with therapy   Objective:   No results found. Recent Labs    06/23/20 1608 06/24/20 0532  WBC 5.5 5.1  HGB 13.1 12.2*  HCT 40.6 37.3*  PLT 203 171   Recent Labs    06/23/20 1608 06/24/20 0532  NA  --  140  K  --  3.4*  CL  --  104  CO2  --  27  GLUCOSE  --  102*  BUN  --  16  CREATININE 1.35* 1.32*  CALCIUM  --  8.9    Intake/Output Summary (Last 24 hours) at 06/24/2020 0959 Last data filed at 06/23/2020 1700 Gross per 24 hour  Intake 180 ml  Output --  Net 180 ml        Physical Exam: Vital Signs Blood pressure (!) 142/94, pulse 75, temperature 99.6 F (37.6 C), temperature source Oral, resp. rate 16, height 6\' 1"  (1.854 m), weight 104.7 kg, SpO2 97 %. Gen: no distress, normal appearing HEENT: oral mucosa pink and moist, NCAT Cardio: Reg rate Chest: normal effort, normal rate of breathing Abd: soft, non-distended Ext: no edema Psych: pleasant, normal affect Skin: intact Neurologic: Cranial nerves II through XII intact, motor strength is 5/5 in bilateral deltoid, bicep, tricep, grip,does not cooperate well with LE testing due to apraxia (has spontaneous movement of BLE but not to command) Aphasic- not consistent with Y/N, difficulty following commands, gestural cues are helpful Sensory exam unable to assess due to aphasia    Musculoskeletal: Full range of motion in all 4 extremities. No joint swelling   Assessment/Plan: 1. Functional deficits which require 3+ hours per day of interdisciplinary therapy in a comprehensive inpatient rehab setting. Physiatrist is providing close team supervision and 24 hour management of active medical problems listed below. Physiatrist and rehab team continue to assess barriers to discharge/monitor patient progress toward functional and medical  goals  Care Tool:  Bathing    Body parts bathed by patient: Right arm, Left arm, Chest, Abdomen, Front perineal area, Right upper leg, Left upper leg, Face   Body parts bathed by helper: Right arm, Left arm, Chest, Abdomen, Front perineal area, Buttocks, Right upper leg, Left upper leg, Right lower leg, Left lower leg     Bathing assist Assist Level: Total Assistance - Patient < 25%     Upper Body Dressing/Undressing Upper body dressing   What is the patient wearing?: Pull over shirt    Upper body assist Assist Level: Minimal Assistance - Patient > 75%    Lower Body Dressing/Undressing Lower body dressing      What is the patient wearing?: Incontinence brief, Pants     Lower body assist Assist for lower body dressing: Moderate Assistance - Patient 50 - 74%     Toileting Toileting    Toileting assist Assist for toileting: 2 Helpers     Transfers Chair/bed transfer  Transfers assist  Chair/bed transfer activity did not occur: Safety/medical concerns  Chair/bed transfer assist level: Minimal Assistance - Patient > 75%     Locomotion Ambulation   Ambulation assist   Ambulation  activity did not occur: Safety/medical concerns          Walk 10 feet activity   Assist           Walk 50 feet activity   Assist           Walk 150 feet activity   Assist           Walk 10 feet on uneven surface  activity   Assist           Wheelchair     Assist               Wheelchair 50 feet with 2 turns activity    Assist            Wheelchair 150 feet activity     Assist          Blood pressure (!) 142/94, pulse 75, temperature 99.6 F (37.6 C), temperature source Oral, resp. rate 16, height 6\' 1"  (1.854 m), weight 104.7 kg, SpO2 97 %.    Medical Problem List and Plan: 1.  Aphasia, gait disorder with sensory cognitive deficits secondary to bilateral MCA and left ACA territory infarctions             -patient  may  shower             -ELOS/Goals: 10-13d  Initial CIR evals today 2.  Antithrombotics: -DVT/anticoagulation: Lovenox.             -antiplatelet therapy: Aspirin 81 mg daily and Brilinta 90 mg twice daily x30 days then aspirin 325 mg daily and Plavix 75 mg daily for another 2 months and then Plavix alone 3. Pain Management: Tylenol as needed 4. Mood: Prozac 20 mg daily             -antipsychotic agents: N/A 5. Neuropsych: This patient is not capable of making decisions on his own behalf. 6. Skin/Wound Care: Routine skin checks 7. Fluids/Electrolytes/Nutrition: Routine in and outs with follow-up chemistries 8.  Diabetes mellitus.  Hemoglobin A1c 9.9.  Levemir 10 units daily.  Check blood sugars before meals and at bedtime.  Diabetic teaching 9.  Seizure prophylaxis.  Keppra 1000 mg twice daily 10.  Hypertension.  Elevated. Increase Norvasc to 10mg  daily on 6/9, HCTZ 12.5 mg daily.  Monitor with increased mobility 11.  Hyperlipidemia.  Lipitor 12.  History of tobacco use.  Counseling 13. Hypokalemia: supplement with on 6/9, repeat tomorrow.  14. AKI: Cr improving, encourage hydration.   LOS: 1 days A FACE TO FACE EVALUATION WAS PERFORMED  P Nathaniel Spears 06/24/2020, 9:59 AM

## 2020-06-25 DIAGNOSIS — I10 Essential (primary) hypertension: Secondary | ICD-10-CM

## 2020-06-25 DIAGNOSIS — E876 Hypokalemia: Secondary | ICD-10-CM

## 2020-06-25 LAB — GLUCOSE, CAPILLARY
Glucose-Capillary: 96 mg/dL (ref 70–99)
Glucose-Capillary: 150 mg/dL — ABNORMAL HIGH (ref 70–99)
Glucose-Capillary: 129 mg/dL — ABNORMAL HIGH (ref 70–99)

## 2020-06-25 LAB — BASIC METABOLIC PANEL
Anion gap: 9 (ref 5–15)
BUN: 13 mg/dL (ref 6–20)
CO2: 26 mmol/L (ref 22–32)
Calcium: 9 mg/dL (ref 8.9–10.3)
Chloride: 104 mmol/L (ref 98–111)
Creatinine, Ser: 1.18 mg/dL (ref 0.61–1.24)
GFR, Estimated: >60 mL/min (ref >60)
Glucose, Bld: 99 mg/dL (ref 70–99)
Potassium: 3.1 mmol/L — ABNORMAL LOW (ref 3.5–5.1)
Sodium: 139 mmol/L (ref 135–145)

## 2020-06-25 MED ORDER — POTASSIUM CHLORIDE CRYS ER 20 MEQ PO TBCR
20.0000 meq | EXTENDED_RELEASE_TABLET | Freq: Once | ORAL | Status: AC
  Administered 2020-06-25: 20 meq via ORAL
  Filled 2020-06-25: qty 1

## 2020-06-25 MED ORDER — POTASSIUM CHLORIDE CRYS ER 20 MEQ PO TBCR
20.0000 meq | EXTENDED_RELEASE_TABLET | Freq: Two times a day (BID) | ORAL | Status: DC
  Administered 2020-06-25 – 2020-07-05 (×20): 20 meq via ORAL
  Filled 2020-06-25 (×20): qty 1

## 2020-06-25 NOTE — Progress Notes (Signed)
Physical Therapy Session Note  Patient Details  Name: Nathaniel Spears MRN: [id number] Date of Birth: [birth year]  Today's Date: 06/25/2020 PT Individual Time: 7902-4097 PT Individual Time Calculation (min): 54 min   Short Term Goals: Week 1:  PT Short Term Goal 1 (Week 1): Pt will complete bed mobility with supervision PT Short Term Goal 2 (Week 1): Pt will completed bed<>chair transfers with CGA consistently PT Short Term Goal 3 (Week 1): Pt will ambulate 132ft with CGA and LRAD PT Short Term Goal 4 (Week 1): Pt will negotiated up/down 12 steps with CGA and LRAD  Skilled Therapeutic Interventions/Progress Updates:   Received pt sitting in recliner with significant other, [name], present at bedside, pt agreeable to PT treatment, and denied any pain during session. Session with emphasis on functional mobility/transfers, generalized strengthening, dynamic standing balance/coordination, gait training, NMR, and improved activity tolerance. Pt ambulated 3ft without AD and CGA to WC (with mild overshooting requiring cues for direction) and transported to 4W dayroom in [facility] total A for time management purposes. Pt ambulated 137ft x 2 trials without AD and CGA/min A. Pt demonstrated narrow BOS, decreased bilateral foot clearance, and decreased arm swing with cues to relax LUE as pt holding in flexor synergy pattern. Upon returning to mat pt with trouble with motor planning and sequencing where to sit on mat and almost sat down on rolling stool. Attempted to work on dynamic standing balance tapping foot to 3 different colored cones based off therapist's cues however pt unable to sequence movements and turning in circles and stepping on top of cones demonstrating overall poor motor planning. Transitioned to performing this activity seated. Pt able to accurately point to correct color cone but unable to sequence movement to kick specified cone with foot. Pt with increased frustration and unable to express this due  to aphasia. Pt performed 2x10 toe taps bilaterally to 3in step without UE support and min A with mod cues for sequencing L foot vs R foot. Challenged pt with counting each rep however with with increased frustration with word finding due to aphasia and frequently stating "seven" despite cues for counting in order. Pt transported back to room in [facility] total A and ambulated 76ft without AD and CGA to recliner. Concluded session with pt sitting in recliner, needs within reach, and seatbelt alarm on. Assisted nutrition services with ordering pt's dinner and breakfast for tomorrow encouraging "yes/no" questions.   Therapy Documentation Precautions:  Precautions Precautions: Fall Precaution Comments: expressive and receptive aphasia Restrictions Weight Bearing Restrictions: No  Therapy/Group: Individual Therapy Martin Majestic PT, DPT   06/25/2020, 7:27 AM

## 2020-06-25 NOTE — IPOC Note (Signed)
Overall Plan of Care [facility]) Patient Details Name: Nathaniel Spears MRN: [id number] DOB: 1975-06-10  Admitting Diagnosis: Acute ischemic stroke [facility])  Hospital Problems: Principal Problem:   Acute ischemic stroke [facility]) Active Problems:   Middle cerebral artery embolism, bilateral     Functional Problem List: Nursing Bladder, Medication Management, Safety, Endurance, Bowel  PT Balance, Endurance, Motor, Safety  OT Balance, Cognition, Endurance, Motor, Safety, Vision  SLP Linguistic, Motor  TR         Basic ADL's: OT Eating, Grooming, Bathing, Dressing, Toileting     Advanced  ADL's: OT Simple Meal Preparation     Transfers: PT Bed Mobility, Bed to Chair, Occupational psychologist, Tub/Shower     Locomotion: PT Ambulation, Stairs     Additional Impairments: OT Fuctional Use of Upper Extremity  SLP Communication, Social Cognition comprehension, expression    TR      Anticipated Outcomes Item Anticipated Outcome  Self Feeding set up/mod I  Swallowing      Basic self-care  supervision  Toileting  supervision   Bathroom Transfers supervision  Bowel/Bladder  manage bladder and bowel with mod I assist  Transfers  supervision  Locomotion  supervision  Communication  Min-Mod A  Cognition     Pain  n/a  Safety/Judgment  maintain safety with cues/reminders   Therapy Plan: PT Intensity: Minimum of 1-2 x/day ,45 to 90 minutes PT Frequency: 5 out of 7 days PT Duration Estimated Length of Stay: 10-14 days OT Intensity: Minimum of 1-2 x/day, 45 to 90 minutes OT Frequency: 5 out of 7 days OT Duration/Estimated Length of Stay: 1-2 weeks SLP Intensity: Minumum of 1-2 x/day, 30 to 90 minutes SLP Frequency: 3 to 5 out of 7 days SLP Duration/Estimated Length of Stay: 10-14 days   Due to the current state of emergency, patients may not be receiving their 3-hours of Medicare-mandated therapy.   Team Interventions: Nursing Interventions Bladder Management, Disease  Management/Prevention, Medication Management, Discharge Planning, Patient/Family Education, Bowel Management  PT interventions Ambulation/gait training, Warden/ranger, Cognitive remediation/compensation, Community reintegration, Patient/family education, Museum/gallery curator, UE/LE Coordination activities, UE/LE Strength taining/ROM, Splinting/orthotics, Pain management, DME/adaptive equipment instruction, Disease management/prevention, Neuromuscular re-education, Skin care/wound management, Therapeutic Exercise, Wheelchair propulsion/positioning, Therapeutic Activities, Visual/perceptual remediation/compensation, Functional mobility training, Psychosocial support, Discharge planning  OT Interventions Balance/vestibular training, Neuromuscular re-education, Self Care/advanced ADL retraining, Cognitive remediation/compensation, DME/adaptive equipment instruction, UE/LE Strength taining/ROM, Firefighter, Equities trader education, UE/LE Coordination activities, Discharge planning, Functional mobility training, Therapeutic Activities, Visual/perceptual remediation/compensation  SLP Interventions Internal/external aids, Speech/Language facilitation, Cueing hierarchy, Functional tasks, Patient/family education  TR Interventions    SW/CM Interventions Discharge Planning, Psychosocial Support, Patient/Family Education, Disease Management/Prevention   Barriers to Discharge MD  Medical stability  Nursing Decreased caregiver support, Home environment access/layout, Incontinence 2 level 6 ste bil rails with wignificant other  PT Decreased caregiver support, Insurance for SNF coverage, Home environment access/layout, Lack of/limited family support, Weight, Other (comments) Expressive and receptive aphasia  OT      SLP      SW Other (comments), Insurance for SNF coverage Patient insured cannot guarentee all follow up services   Team Discharge Planning: Destination: PT-Home ,OT- Home ,  SLP-Home Projected Follow-up: PT-24 hour supervision/assistance, OT-  Outpatient OT, SLP-24 hour supervision/assistance, Outpatient SLP Projected Equipment Needs: PT-To be determined, OT- To be determined, SLP-None recommended by SLP Equipment Details: PT- , OT-  Patient/family involved in discharge planning: PT- Patient,  OT-Patient, SLP-Patient, Family member/caregiver  MD ELOS: 10-14 days Medical Rehab Prognosis:  Excellent Assessment: The patient has been admitted for CIR therapies with the diagnosis of left CVA with aphasia. The team will be addressing functional mobility, strength, stamina, balance, safety, adaptive techniques and equipment, self-care, bowel and bladder mgt, patient and caregiver education, NMR, cognition, language/communication, community reentry. Goals have been set at supervision for self-care and mobility and min/mod assist for communication.   Due to the current state of emergency, patients may not be receiving their 3 hours per day of Medicare-mandated therapy.    Ranelle Oyster, MD, FAAPMR     See Team Conference Notes for weekly updates to the plan of care

## 2020-06-25 NOTE — Progress Notes (Signed)
PROGRESS NOTE   Subjective/Complaints: Up at sink. Feels well. No complaints.   ROS: limited due to language/communication    Objective:   No results found. Recent Labs    06/23/20 1608 06/24/20 0532  WBC 5.5 5.1  HGB 13.1 12.2*  HCT 40.6 37.3*  PLT 203 171   Recent Labs    06/24/20 0532 06/25/20 0453  NA 140 139  K 3.4* 3.1*  CL 104 104  CO2 27 26  GLUCOSE 102* 99  BUN 16 13  CREATININE 1.32* 1.18  CALCIUM 8.9 9.0    Intake/Output Summary (Last 24 hours) at 06/25/2020 1304 Last data filed at 06/25/2020 1300 Gross per 24 hour  Intake 480 ml  Output 300 ml  Net 180 ml        Physical Exam: Vital Signs Blood pressure (!) 141/99, pulse 88, temperature 98.7 F (37.1 C), temperature source Oral, resp. rate 16, height 6\' 1"  (1.854 m), weight 104.7 kg, SpO2 (!) 67 %. Constitutional: No distress . Vital signs reviewed. HEENT: EOMI, oral membranes moist Neck: supple Cardiovascular: RRR without murmur. No JVD    Respiratory/Chest: CTA Bilaterally without wheezes or rales. Normal effort    GI/Abdomen: BS +, non-tender, non-distended Ext: no clubbing, cyanosis, or edema Psych: pleasant and cooperative Skin: intact Neurologic: Cranial nerves II through XII intact, motor strength is 5/5 in bilateral deltoid, bicep, tricep, grip. Standing with good balance Aphasic- not consistent with Y/N, does seem to comprehend most basic information Sensory exam unable to assess due to aphasia    Musculoskeletal: Full range of motion in all 4 extremities. No joint swelling   Assessment/Plan: 1. Functional deficits which require 3+ hours per day of interdisciplinary therapy in a comprehensive inpatient rehab setting. Physiatrist is providing close team supervision and 24 hour management of active medical problems listed below. Physiatrist and rehab team continue to assess barriers to discharge/monitor patient progress toward  functional and medical goals  Care Tool:  Bathing    Body parts bathed by patient: Right arm, Left arm, Chest, Abdomen, Front perineal area, Right upper leg, Left upper leg, Face   Body parts bathed by helper: Right arm, Left arm, Chest, Abdomen, Front perineal area, Buttocks, Right upper leg, Left upper leg, Right lower leg, Left lower leg     Bathing assist Assist Level: Total Assistance - Patient < 25%     Upper Body Dressing/Undressing Upper body dressing   What is the patient wearing?: Pull over shirt    Upper body assist Assist Level: Minimal Assistance - Patient > 75%    Lower Body Dressing/Undressing Lower body dressing      What is the patient wearing?: Pants, Incontinence brief     Lower body assist Assist for lower body dressing: Moderate Assistance - Patient 50 - 74%     Toileting Toileting    Toileting assist Assist for toileting: Moderate Assistance - Patient 50 - 74%     Transfers Chair/bed transfer  Transfers assist  Chair/bed transfer activity did not occur: Safety/medical concerns  Chair/bed transfer assist level: Minimal Assistance - Patient > 75%     Locomotion Ambulation   Ambulation assist   Ambulation activity did  not occur: Safety/medical concerns  Assist level: Minimal Assistance - Patient > 75% Assistive device: No Device Max distance: 150'   Walk 10 feet activity   Assist     Assist level: Minimal Assistance - Patient > 75% Assistive device: No Device   Walk 50 feet activity   Assist    Assist level: Minimal Assistance - Patient > 75% Assistive device: No Device    Walk 150 feet activity   Assist    Assist level: Minimal Assistance - Patient > 75% Assistive device: No Device    Walk 10 feet on uneven surface  activity   Assist     Assist level: Minimal Assistance - Patient > 75% Assistive device: Other (comment) (no Device)   Wheelchair     Assist Will patient use wheelchair at discharge?:  No   Wheelchair activity did not occur: N/A         Wheelchair 50 feet with 2 turns activity    Assist    Wheelchair 50 feet with 2 turns activity did not occur: N/A       Wheelchair 150 feet activity     Assist  Wheelchair 150 feet activity did not occur: N/A       Blood pressure (!) 141/99, pulse 88, temperature 98.7 F (37.1 C), temperature source Oral, resp. rate 16, height 6\' 1"  (1.854 m), weight 104.7 kg, SpO2 (!) 67 %.    Medical Problem List and Plan: 1.  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  Antithrombotics: -DVT/anticoagulation: Lovenox.             -antiplatelet therapy: Aspirin 81 mg daily and Brilinta 90 mg twice daily x30 days then aspirin 325 mg daily and Plavix 75 mg daily for another 2 months and then Plavix alone 3. Pain Management: Tylenol as needed 4. Mood: Prozac 20 mg daily             -antipsychotic agents: N/A 5. Neuropsych: This patient is not capable of making decisions on his own behalf. 6. Skin/Wound Care: Routine skin checks 7. Fluids/Electrolytes/Nutrition:   Encourage po intake  8.  Diabetes mellitus.  Hemoglobin A1c 9.9.  Levemir 10 units daily.  Check blood sugars before meals and at bedtime.  Diabetic teaching  CBG (last 3)  Recent Labs    06/24/20 1647 06/25/20 0546 06/25/20 1227  GLUCAP 85 96 150*    -6/10 reasonable control at present 9.  Seizure prophylaxis.  Keppra 1000 mg twice daily 10.  Hypertension.  Elevated. Increase Norvasc to 10mg  daily on 6/9, HCTZ 12.5 mg daily.     6/10 observe with recently increased norvasc 11.  Hyperlipidemia.  Lipitor 12.  History of tobacco use.  Counseling 13. Hypokalemia: still only 3.1, supplement bid  14. AKI: ongoing improvement. Encourage fluids  LOS: 2 days A FACE TO  FACE EVALUATION WAS PERFORMED  8/9 06/25/2020, 1:04 PM

## 2020-06-25 NOTE — Progress Notes (Signed)
Occupational Therapy Session Note  Patient Details  Name: Nathaniel Spears MRN: [id number] Date of Birth: January 15, 1976  Today's Date: 06/25/2020 OT Individual Time: 7824-2353 OT Individual Time Calculation (min): 54 min    Short Term Goals: Week 1:  OT Short Term Goal 1 (Week 1): patient will complete functional transfers and bed mobility with CS OT Short Term Goal 2 (Week 1): patient will complete adl tasks/grooming/bathing with min A and min cues for initiation, sequencing OT Short Term Goal 3 (Week 1): patient will complete basic meal prep 2-3 step task with CS and mod cues OT Short Term Goal 4 (Week 1): patient will scan right side of environment with min cues  Skilled Therapeutic Interventions/Progress Updates:    Pt greeted semi-reclined in bed asleep, easy to wake and agreeable to OT treatment session focused on self-care retraining at shower level. Pt very slow to initiate bed mobility, requiring multimodal cues and min A to get to EOB. Attempted sit<>stand and ambulation with RW, but pt had difficulty understanding the safe use of RW, not really holding on, so OT removed RW and ambulated with pt into bathroom w/ min HHA. Pt needed max multimodal cues for safety and body awareness to step over shower ledge. Max multimodal cues and mod A for bathing 2/2 perseveration, poor initiation, attention, and sequencing. Max cues to integrate L side of body. Dressing from wc with improved sequencing for dressing tasks. Pt brought to the sink to brush teeth. Pt with difficulty initaiting and sequencing task requiring max multimodal cues and OT assist to turn on water, apply toothpaste, and cues to spit. Pt very aphasic and was unable to express wants and needs although trying really hard to! OT set pt up with breakfast tray. Pt had difficulty peeling banana and just looked at it for a while before OT intervened to assist with peeling it. OT removed all distractions to help pt attend to meal. Pt left in  recliner with chair alarm on, call bell in reach, and needs met.   Therapy Documentation Precautions:  Precautions Precautions: Fall Precaution Comments: expressive and receptive aphasia Restrictions Weight Bearing Restrictions: No Pain:   Denies pain ADL: ADL Eating: Maximal assistance Where Assessed-Eating: Bed level Grooming: Minimal assistance Where Assessed-Grooming: Edge of bed, Standing at sink Upper Body Bathing: Maximal assistance Where Assessed-Upper Body Bathing: Edge of bed Lower Body Bathing: Maximal assistance Where Assessed-Lower Body Bathing: Edge of bed Upper Body Dressing: Minimal assistance Where Assessed-Upper Body Dressing: Edge of bed Lower Body Dressing: Moderate assistance Where Assessed-Lower Body Dressing: Edge of bed Toileting: Moderate assistance Toilet Transfer: Minimal assistance Toilet Transfer Method: Ambulating ADL Comments: patient with impaired initiation, sequencing and perseveration    Therapy/Group: Individual Therapy  Valma Cava 06/25/2020, 8:30 AM

## 2020-06-25 NOTE — Progress Notes (Signed)
Occupational Therapy Session Note  Patient Details  Name: Nathaniel Spears MRN: [id number] Date of Birth: 12-18-1975  Today's Date: 06/25/2020 OT Individual Time: 0086-7619 OT Individual Time Calculation (min): 68 min    Short Term Goals: Week 1:  OT Short Term Goal 1 (Week 1): patient will complete functional transfers and bed mobility with CS OT Short Term Goal 2 (Week 1): patient will complete adl tasks/grooming/bathing with min A and min cues for initiation, sequencing OT Short Term Goal 3 (Week 1): patient will complete basic meal prep 2-3 step task with CS and mod cues OT Short Term Goal 4 (Week 1): patient will scan right side of environment with min cues  Skilled Therapeutic Interventions/Progress Updates:    Treatment session with focus on initiation, sequencing, attention to task during structured and familiar, functional tasks.  Pt received upright in recliner agreeable to therapy session.  Pt donned shoes with mod demonstration cues and cues for sequencing.  Pt able to don L shoe and then able to tie R shoe with increased time.  Pt ambulated HHA to w/c to transport to therapy gym for time management.  Engaged in personal 12 piece puzzle with max multimodal cues for initiation, sequencing, and problem solving.  Therapist provided picture of completed puzzle for reference and would decrease stimulus to 2-3 pieces at a time.  Engaged in matching activity initially requiring max multimodal cues fading to mod cues.  Therapist increased challenge with pt demonstrating increased difficulty.  Therapist transitioned to more functional task of sorting silverware with pt still requiring mod -max cues throughout.  Therapist encouraged use of RUE as pt utilizing LUE primarily during all tasks.  Pt demonstrating decreased initiation with use of RUE, therefore transitioned back to LUE.  Pt returned to room and ambulated HHA back to recliner.  [name] significant other, [name], present in room upon arrival.   Therapist discussed with pt and SO plan to incorporate familiar, functional tasks with activities to facilitate improved attention, initiation, and sequencing due to cognitive impairments and aphasia.  Therapy Documentation Precautions:  Precautions Precautions: Fall Precaution Comments: expressive and receptive aphasia Restrictions Weight Bearing Restrictions: No  Pain: Pain Assessment Pain Scale: 0-10 Pain Score: 0-No pain    Therapy/Group: Individual Therapy  Rosalio Loud 06/25/2020, 11:54 AM

## 2020-06-25 NOTE — Progress Notes (Signed)
Speech Language Pathology Daily Session Note  Patient Details  Name: Nathaniel Spears MRN: [id number] Date of Birth: 12/15/1975  Today's Date: 06/25/2020 SLP Individual Time: 1400-1445 SLP Individual Time Calculation (min): 45 min  Short Term Goals: Week 1: SLP Short Term Goal 1 (Week 1): Patient will follow 1-step directions related to functional environment with 50% accuracy and Mod A verbal and visual cues SLP Short Term Goal 2 (Week 1): Patient will identify objects/object pictures within field of three with 50% accuracy and Mod A verbal and visual cues SLP Short Term Goal 3 (Week 1): Patient will verbally name relevant objects/pictures/people with 50% accuracy and Max A verbal and visual cues  (phonemic, first letter, and sentence completion cues.) SLP Short Term Goal 4 (Week 1): Patient will utilize multimodal communication Retail banker, writing?) during 4/5 opportunities with Mod A verbal and visual cues  Skilled Therapeutic Interventions:  Patient seen for skilled ST session focusing on expressive language goals. Patient sitting in recliner and very receptive towards therapy session. He was able to name 9/16 object pictures without cues and named the rest with initial phoneme and/or part word cues. He was able to to read at word level with minimal cues required and at phrase level with min-modA cues to block off words secondary to patient's difficulty with slowing down. He was able to name object to complete functional phrase (cut paper with___) for 8/12 items. He exhibited dysfluencies of halting speech, inconsistent rate of verbal production and decreased cognitive-linguistic processing which he did appear to have some awareness of. At times he was able to respond with fluent phrases without difficulty such as telling SLP his girlfriend "works at a call center", but then not being able to describe at all when presented with a picture of a different family member, then finally able to  say it was "my nephew". Patient very motivated and seemed to enjoy working with SLP. He was able to point to map on SLP's phone to find out where he was from "[place]". He continues to benefit from skilled SLP intervention to maximize speech-language and cognitive goals prior to discharge.  Pain Pain Assessment Pain Scale: 0-10 Pain Score: 0-No pain  Therapy/Group: Individual Therapy  Angela Nevin, MA, CCC-SLP Speech Therapy

## 2020-06-26 DIAGNOSIS — R4701 Aphasia: Secondary | ICD-10-CM

## 2020-06-26 DIAGNOSIS — K5901 Slow transit constipation: Secondary | ICD-10-CM

## 2020-06-26 LAB — GLUCOSE, CAPILLARY
Glucose-Capillary: 114 mg/dL — ABNORMAL HIGH (ref 70–99)
Glucose-Capillary: 124 mg/dL — ABNORMAL HIGH (ref 70–99)
Glucose-Capillary: 121 mg/dL — ABNORMAL HIGH (ref 70–99)
Glucose-Capillary: 162 mg/dL — ABNORMAL HIGH (ref 70–99)

## 2020-06-26 MED ORDER — SORBITOL 70 % SOLN
60.0000 mL | Freq: Once | Status: AC
  Administered 2020-06-26: 60 mL via ORAL
  Filled 2020-06-26: qty 60

## 2020-06-26 NOTE — Progress Notes (Signed)
Speech Language Pathology Daily Session Note  Patient Details  Name: Nathaniel Spears MRN: [id number] Date of Birth: Jun 13, 1975  Today's Date: 06/26/2020 SLP Individual Time: 9562-1308 SLP Individual Time Calculation (min): 42 min  Short Term Goals: Week 1: SLP Short Term Goal 1 (Week 1): Patient will follow 1-step directions related to functional environment with 50% accuracy and Mod A verbal and visual cues SLP Short Term Goal 2 (Week 1): Patient will identify objects/object pictures within field of three with 50% accuracy and Mod A verbal and visual cues SLP Short Term Goal 3 (Week 1): Patient will verbally name relevant objects/pictures/people with 50% accuracy and Max A verbal and visual cues  (phonemic, first letter, and sentence completion cues.) SLP Short Term Goal 4 (Week 1): Patient will utilize multimodal communication Retail banker, writing?) during 4/5 opportunities with Mod A verbal and visual cues  Skilled Therapeutic Interventions: Pt seen for skilled ST with focus on expressive language goals. Pt sitting in recliner, agreeable to therapy. Pt states he notices his speech is worse in the AM and improves throughout the day. Also reports expressive language worse when he "gets excited". Environmental changes to reduce stimulation/anxiety and increase structure in language tasks.  Pt naming 12/20 pictures without cues, more difficulty in the beginning of the task with increase naming as task progressed. Pt did demonstrate frequent perseverations on previously named pictures, benefits from cues to take time to look at picture before naming to reduce impulsivity. SLP facilitating phrase completion task by providing min A verbal cues for 90% accuracy. Pt left in recliner with belt alarm set and all needs within reach. Cont ST POC.   Pain Pain Assessment Pain Scale: 0-10 Pain Score: 0-No pain  Therapy/Group: Individual Therapy  Tacey Ruiz 06/26/2020, 10:25 AM

## 2020-06-26 NOTE — Progress Notes (Signed)
Physical Therapy Session Note  Patient Details  Name: Nathaniel Spears MRN: [id number] Date of Birth: 1975/07/26  Today's Date: 06/26/2020 PT Individual Time: 1500-1540 PT Individual Time Calculation (min): 40 min   Short Term Goals: Week 1:  PT Short Term Goal 1 (Week 1): Pt will complete bed mobility with supervision PT Short Term Goal 2 (Week 1): Pt will completed bed<>chair transfers with CGA consistently PT Short Term Goal 3 (Week 1): Pt will ambulate 128ft with CGA and LRAD PT Short Term Goal 4 (Week 1): Pt will negotiated up/down 12 steps with CGA and LRAD Week 2:    Week 3:     Skilled Therapeutic Interventions/Progress Updates:    Pain:  Pt reports no pain.  Treatment to tolerance.  Rest breaks and repositioning as needed.  Pt initially resting in sidelying and agreeable to treatment session w/focus on caregiver training.  Girlfriend/caregiver Ms. Kathreen Cosier requesting training w/bathroom transfers.  Pt in agreement w/this request.  GF instructed to observe transfer w/therapist assisting pt and verbalizing process for GF.  Gf on phone but disconnected at therapist request. Pt supine to sit w/supervision. Sit to stand to RW w/cues for safe hand placement. Gait 75ft to commode w/cga.  Pt w/much difficulty turning using RW due to motor planning deficits.  Attempts to turn walker only, attempts to place walker facing commode and back to commode w/walker obstructing commode.  When walker moved to side, and pt cued to reach for grab bar, pt completes tuning and sitting to commode w/ease.   Sit to stand from commode w/RW w/cga and cues for hand placement, gait to bed w/cga.  Turns to sit on edge of bed w/tactile cues at hips to guide turning, again turns w/improved ease when walker abandoned and pt guided to use bedrail for support.  GF then performed Sit to stand, gait to commode, commode transfer, and gait back to bed but poor guarding of pt.  Positioned self in front of pt rather than at  side.  Difficulty w/manageing walker/pt w/turning.  Therapist verbally instructed corrections during transfer/gait and GF w/improved technique, voices understanding.  Practiced above using GF as pt and therapist demonstrating safe guarding/assistance technique.  GF then repeated above gait/transfer w/pt demonstrating safe guarding/cueing/assist w/all aspects of this transfer.  Nursing notified that GF had been instructed and cleared to assist pt to/from BR as above and safety plan documented.  GF also instructed w/deactivating and reactivating bed alarm and verbalized understanding that bed alarm must be set at any time pt is in the bed.  Also notifiec that nursing must be made aware if she was taking pt to/from BR.  Pt then performed gait >369ft pushing wc w/challenge of increasing gait speed to increase intensity challenge.  At end of session, Pt left supine w/rails up x 4 alarm set, bed in lowest position, and needs in reach.     Therapy Documentation Precautions:  Precautions Precautions: Fall Precaution Comments: expressive and receptive aphasia Restrictions Weight Bearing Restrictions: No    Therapy/Group: Individual Therapy Rada Hay, PT   Shearon Balo 06/26/2020, 4:03 PM

## 2020-06-26 NOTE — Progress Notes (Signed)
PROGRESS NOTE   Subjective/Complaints:   Pt reports no appetite, no nausea, but has ab pain- no BM in 3+ days.  Feels constipated.  Ate ~40% of breakfast at most.    ROS: limited due to aphasia.    Objective:   No results found. Recent Labs    06/23/20 1608 06/24/20 0532  WBC 5.5 5.1  HGB 13.1 12.2*  HCT 40.6 37.3*  PLT 203 171   Recent Labs    06/24/20 0532 06/25/20 0453  NA 140 139  K 3.4* 3.1*  CL 104 104  CO2 27 26  GLUCOSE 102* 99  BUN 16 13  CREATININE 1.32* 1.18  CALCIUM 8.9 9.0    Intake/Output Summary (Last 24 hours) at 06/26/2020 0957 Last data filed at 06/26/2020 0700 Gross per 24 hour  Intake 380 ml  Output --  Net 380 ml        Physical Exam: Vital Signs Blood pressure 104/82, pulse 67, temperature 97.7 F (36.5 C), temperature source Oral, resp. rate 20, height 6\' 1"  (1.854 m), weight 104.7 kg, SpO2 97 %.    General: awake, alert, appropriate, aphasic; NAD HENT: conjugate gaze; oropharynx moist CV: regular rate; no JVD Pulmonary: CTA B/L; no W/R/R- good air movement GI: soft, mild TTP diffusely- ND; hypoactive BS Psychiatric: appropriate; a little frustrated Neurological: aphasic- speaking at word level- able to interact better today  Ext: no clubbing, cyanosis, or edema Psych: pleasant and cooperative Skin: intact Neurologic: Cranial nerves II through XII intact, motor strength is 5/5 in bilateral deltoid, bicep, tricep, grip. Standing with good balance Aphasic- not consistent with Y/N, does seem to comprehend most basic information Sensory exam unable to assess due to aphasia    Musculoskeletal: Full range of motion in all 4 extremities. No joint swelling   Assessment/Plan: 1. Functional deficits which require 3+ hours per day of interdisciplinary therapy in a comprehensive inpatient rehab setting. Physiatrist is providing close team supervision and 24 hour management of  active medical problems listed below. Physiatrist and rehab team continue to assess barriers to discharge/monitor patient progress toward functional and medical goals  Care Tool:  Bathing    Body parts bathed by patient: Right arm, Left arm, Chest, Abdomen, Front perineal area, Right upper leg, Left upper leg, Face   Body parts bathed by helper: Right arm, Left arm, Chest, Abdomen, Front perineal area, Buttocks, Right upper leg, Left upper leg, Right lower leg, Left lower leg     Bathing assist Assist Level: Total Assistance - Patient < 25%     Upper Body Dressing/Undressing Upper body dressing   What is the patient wearing?: Pull over shirt    Upper body assist Assist Level: Minimal Assistance - Patient > 75%    Lower Body Dressing/Undressing Lower body dressing      What is the patient wearing?: Pants, Incontinence brief     Lower body assist Assist for lower body dressing: Moderate Assistance - Patient 50 - 74%     Toileting Toileting    Toileting assist Assist for toileting: Moderate Assistance - Patient 50 - 74%     Transfers Chair/bed transfer  Transfers assist  Chair/bed transfer activity did  not occur: Safety/medical concerns  Chair/bed transfer assist level: Contact Guard/Touching assist     Locomotion Ambulation   Ambulation assist   Ambulation activity did not occur: Safety/medical concerns  Assist level: Contact Guard/Touching assist Assistive device: No Device Max distance: 1101ft   Walk 10 feet activity   Assist     Assist level: Contact Guard/Touching assist Assistive device: No Device   Walk 50 feet activity   Assist    Assist level: Contact Guard/Touching assist Assistive device: No Device    Walk 150 feet activity   Assist    Assist level: Contact Guard/Touching assist Assistive device: No Device    Walk 10 feet on uneven surface  activity   Assist     Assist level: Minimal Assistance - Patient >  75% Assistive device: Other (comment) (no Device)   Wheelchair     Assist Will patient use wheelchair at discharge?: No   Wheelchair activity did not occur: N/A         Wheelchair 50 feet with 2 turns activity    Assist    Wheelchair 50 feet with 2 turns activity did not occur: N/A       Wheelchair 150 feet activity     Assist  Wheelchair 150 feet activity did not occur: N/A       Blood pressure 104/82, pulse 67, temperature 97.7 F (36.5 C), temperature source Oral, resp. rate 20, height 6\' 1"  (1.854 m), weight 104.7 kg, SpO2 97 %.    Medical Problem List and Plan: 1.  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  Antithrombotics: -DVT/anticoagulation: Lovenox.             -antiplatelet therapy: Aspirin 81 mg daily and Brilinta 90 mg twice daily x30 days then aspirin 325 mg daily and Plavix 75 mg daily for another 2 months and then Plavix alone 3. Pain Management: Tylenol as needed 4. Mood: Prozac 20 mg daily             -antipsychotic agents: N/A 5. Neuropsych: This patient is not capable of making decisions on his own behalf. 6. Skin/Wound Care: Routine skin checks 7. Fluids/Electrolytes/Nutrition:   Encourage po intake  8.  Diabetes mellitus.  Hemoglobin A1c 9.9.  Levemir 10 units daily.  Check blood sugars before meals and at bedtime.  Diabetic teaching  CBG (last 3)  Recent Labs    06/25/20 1227 06/25/20 1621 06/26/20 0628  GLUCAP 150* 129* 114*    -66/11- BG's overall adequate control- con't current regimen 9.  Seizure prophylaxis.  Keppra 1000 mg twice daily 10.  Hypertension.  Elevated. Increase Norvasc to 10mg  daily on 6/9, HCTZ 12.5 mg daily.     6/11- BP actually slightly soft this AM- no orthostasis- con't regimen 11.  Hyperlipidemia.  Lipitor 12.  History of tobacco use.   Counseling 13. Hypokalemia: still only 3.1, supplement 8/9 bid 6/11- recheck Monday  14. AKI: ongoing improvement. Encourage fluids 15. Constipation  6/11- will give Sorbitol 60cc at 3pm after therapy- today and see if can get him to have BM's.   LOS: 3 days A FACE TO FACE EVALUATION WAS PERFORMED  Victorino Fatzinger 06/26/2020,

## 2020-06-26 NOTE — Progress Notes (Signed)
Occupational Therapy Session Note  Patient Details  Name: Nathaniel Spears MRN: [id number] Date of Birth: 1975/09/28  Today's Date: 06/26/2020 OT Individual Time: 7680-8811 OT Individual Time Calculation (min): 57 min    Short Term Goals: Week 1:  OT Short Term Goal 1 (Week 1): patient will complete functional transfers and bed mobility with CS OT Short Term Goal 2 (Week 1): patient will complete adl tasks/grooming/bathing with min A and min cues for initiation, sequencing OT Short Term Goal 3 (Week 1): patient will complete basic meal prep 2-3 step task with CS and mod cues OT Short Term Goal 4 (Week 1): patient will scan right side of environment with min cues  Skilled Therapeutic Interventions/Progress Updates:     Pt received in bed with no pain reported   ADL:  Pt completes bathing with CGA at sit to stand level in shower. With perseverative pattern of bathrin LUE with RUE. Pt requires max multimodal cuing mostly verbal and tactile for sequencing bathing vody parts HOH A for iniation of bathing R armpit Pt completes UB dressing with MOD A d/t poor orientation of shirt to self. Pt completes LB dressing with CGA sit to stand at sink Pt completes footwear with S to don socks and shoes Pt completes shower/Tub transfer with MIN HHA as pt with poor RW use and pushes out too far in front of self    Therapeutic activity Pt amb in hallway with MIN HHA and OT cuing to scan for room numbers and various signs. Pt able to name "lemon" and "fire extinguisher" when looking at various signs.   Pt left at end of session in recliner with exit alarm on, call light in reach and all needs met   Therapy Documentation Precautions:  Precautions Precautions: Fall Precaution Comments: expressive and receptive aphasia Restrictions Weight Bearing Restrictions: No General:   Vital Signs:   Pain:   ADL: ADL Eating: Maximal assistance Where Assessed-Eating: Bed level Grooming: Minimal  assistance Where Assessed-Grooming: Edge of bed, Standing at sink Upper Body Bathing: Maximal assistance Where Assessed-Upper Body Bathing: Edge of bed Lower Body Bathing: Maximal assistance Where Assessed-Lower Body Bathing: Edge of bed Upper Body Dressing: Minimal assistance Where Assessed-Upper Body Dressing: Edge of bed Lower Body Dressing: Moderate assistance Where Assessed-Lower Body Dressing: Edge of bed Toileting: Moderate assistance Toilet Transfer: Minimal assistance Toilet Transfer Method: Ambulating ADL Comments: patient with impaired initiation, sequencing and perseveration Vision   Perception    Praxis   Exercises:   Other Treatments:     Therapy/Group: Individual Therapy  Tonny Branch 06/26/2020, 8:57 AM

## 2020-06-26 NOTE — Plan of Care (Signed)
  Problem: RH BLADDER ELIMINATION Goal: RH STG MANAGE BLADDER WITH ASSISTANCE Description: STG Manage Bladder With  mod I Assistance Outcome: Not Progressing; incontinence at times   Problem: RH KNOWLEDGE DEFICIT Goal: RH STG INCREASE KNOWLEDGE OF DIABETES Description: Patient and significant other will be able to manage DM with medications and dietary modifications using handouts and educational resources independently Outcome: Not Progressing; aphasia   Problem: RH KNOWLEDGE DEFICIT Goal: RH STG INCREASE KNOWLEDGE OF HYPERTENSION Description: Patient and significant other will be able to manage HTN with medications and dietary modifications using handouts and educational resources independently Outcome: Not Progressing; aphasia   Problem: RH KNOWLEDGE DEFICIT Goal: RH STG INCREASE KNOWLEGDE OF HYPERLIPIDEMIA Description: Patient and significant other will be able to manage HLD with medications and dietary modifications using handouts and educational resources independently Outcome: Not Progressing; aphasia

## 2020-06-26 NOTE — Progress Notes (Addendum)
Physical Therapy Session Note  Patient Details  Name: Nathaniel Spears MRN: [id number] Date of Birth: [birth year]  Today's Date: 06/26/2020 PT Individual Time: 5638-7564 PT Individual Time Calculation (min): 45 min   Short Term Goals: Week 1:  PT Short Term Goal 1 (Week 1): Pt will complete bed mobility with supervision PT Short Term Goal 2 (Week 1): Pt will completed bed<>chair transfers with CGA consistently PT Short Term Goal 3 (Week 1): Pt will ambulate 133ft with CGA and LRAD PT Short Term Goal 4 (Week 1): Pt will negotiated up/down 12 steps with CGA and LRAD  Skilled Therapeutic Interventions/Progress Updates:     Pt resting in bed.  He communicated no pain during session.  Supine> sit with supervision.  Sit> stand > w/c without AD, CGA.  Gait using grocery cart as AD on level tile x 150' inclduing turns, CGA.  neuromuscular re-education via demo, multimodal cues for seated: R/L long arc quad knee extension with ankle pumps at end range  Dual task in standing on compliant mat, x 3 minutes x 2, using R/L hands for manipulating pegs in /out of peg board per color, with mod tactile and visual cues needed for sorting by color. With phonemic cues, pt verbalized "blue, black, green".  Gait to return to [facility] unit, > 300', using elevator, pushing wc from behind, as AD, CG/supervision.  Sit> supine on bed iwht supervision and cues.  At end of session, pt resting in bed with needs at hand, bed alarm set and SO [name] present.   Therapy Documentation Precautions:  Precautions Precautions: Fall Precaution Comments: expressive and receptive aphasia Restrictions Weight Bearing Restrictions: No      Therapy/Group: Individual Therapy  Sherma Vanmetre 06/26/2020, 4:38 PM

## 2020-06-27 LAB — GLUCOSE, CAPILLARY
Glucose-Capillary: 108 mg/dL — ABNORMAL HIGH (ref 70–99)
Glucose-Capillary: 143 mg/dL — ABNORMAL HIGH (ref 70–99)
Glucose-Capillary: 137 mg/dL — ABNORMAL HIGH (ref 70–99)

## 2020-06-27 NOTE — Progress Notes (Signed)
Speech Language Pathology Daily Session Note  Patient Details  Name: Nathaniel Spears MRN: [id number] Date of Birth: 08-31-1975  Today's Date: 06/27/2020 SLP Individual Time: 6283-1517 SLP Individual Time Calculation (min): 40 min  Short Term Goals: Week 1: SLP Short Term Goal 1 (Week 1): Patient will follow 1-step directions related to functional environment with 50% accuracy and Mod A verbal and visual cues SLP Short Term Goal 2 (Week 1): Patient will identify objects/object pictures within field of three with 50% accuracy and Mod A verbal and visual cues SLP Short Term Goal 3 (Week 1): Patient will verbally name relevant objects/pictures/people with 50% accuracy and Max A verbal and visual cues  (phonemic, first letter, and sentence completion cues.) SLP Short Term Goal 4 (Week 1): Patient will utilize multimodal communication Retail banker, writing?) during 4/5 opportunities with Mod A verbal and visual cues  Skilled Therapeutic Interventions:  Pt was seen for skilled ST targeting goals for communication.  Pt was awake, alert, and agreeable to participate in treatment.  Pt needed max assist and choice of two to indicate that he was having increased difficulty communicating this morning (ie "Has talking today been easier or harder than usual?").  SLP facilitated the session with a structured naming task which pt was able to complete with max faded to mod assist multimodal cues.  When branching up to include phrase level communication pt consistently needed max assist multimodal cues to create a phrase to describe simple actions in photographs.  [name] overall verbal expression was characterized by frequent perseveration of which pt was aware but struggled to correct without cuing as mentioned above.  Most helpful for facilitating functional communication was providing pt with a choice of two to convey immediate needs or wants.  Pt was left in bed with bed alarm set and call bell within reach.   Continue per current plan of care  Pain Pain Assessment Pain Scale: 0-10 Pain Score: 0-No pain  Therapy/Group: Individual Therapy  Makyna Niehoff, Melanee Spry 06/27/2020, 12:28 PM

## 2020-06-27 NOTE — Plan of Care (Signed)
  Problem: RH BLADDER ELIMINATION Goal: RH STG MANAGE BLADDER WITH ASSISTANCE Description: STG Manage Bladder With  mod I Assistance Outcome: Not Progressing; time toileting and cueing ; needs a lot of encouragement Problem: RH KNOWLEDGE DEFICIT Goal: RH STG INCREASE KNOWLEDGE OF DIABETES Description: Patient and significant other will be able to manage DM with medications and dietary modifications using handouts and educational resources independently Outcome: Not Progressing; aphasic Problem: RH KNOWLEDGE DEFICIT Goal: RH STG INCREASE KNOWLEDGE OF HYPERTENSION Description: Patient and significant other will be able to manage HTN with medications and dietary modifications using handouts and educational resources independently Outcome: Not Progressing; aphasic Problem: RH KNOWLEDGE DEFICIT Goal: RH STG INCREASE KNOWLEGDE OF HYPERLIPIDEMIA Description: Patient and significant other will be able to manage HLD with medications and dietary modifications using handouts and educational resources independently Outcome: Not Progressing; aphasic

## 2020-06-27 NOTE — Plan of Care (Signed)
  Problem: RH KNOWLEDGE DEFICIT Goal: RH STG INCREASE KNOWLEDGE OF DIABETES Description: Patient and significant other will be able to manage DM with medications and dietary modifications using handouts and educational resources independently 06/27/2020 1259 by Pierre Bali, RN Outcome: Not Progressing; patient needs a lot of cueing when eating; set up tray ; intermittent supervision.

## 2020-06-28 LAB — BASIC METABOLIC PANEL
Anion gap: 9 (ref 5–15)
BUN: 15 mg/dL (ref 6–20)
CO2: 28 mmol/L (ref 22–32)
Calcium: 8.9 mg/dL (ref 8.9–10.3)
Chloride: 102 mmol/L (ref 98–111)
Creatinine, Ser: 1.31 mg/dL — ABNORMAL HIGH (ref 0.61–1.24)
GFR, Estimated: >60 mL/min (ref >60)
Glucose, Bld: 114 mg/dL — ABNORMAL HIGH (ref 70–99)
Potassium: 3.8 mmol/L (ref 3.5–5.1)
Sodium: 139 mmol/L (ref 135–145)

## 2020-06-28 LAB — GLUCOSE, CAPILLARY
Glucose-Capillary: 97 mg/dL (ref 70–99)
Glucose-Capillary: 130 mg/dL — ABNORMAL HIGH (ref 70–99)
Glucose-Capillary: 113 mg/dL — ABNORMAL HIGH (ref 70–99)
Glucose-Capillary: 108 mg/dL — ABNORMAL HIGH (ref 70–99)
Glucose-Capillary: 140 mg/dL — ABNORMAL HIGH (ref 70–99)

## 2020-06-28 NOTE — Progress Notes (Signed)
Physical Therapy Session Note  Patient Details  Name: Nathaniel Spears MRN: [id number] Date of Birth: 11-02-1975  Today's Date: 06/28/2020 PT Individual Time: 7902-4097 PT Individual Time Calculation (min): 54 min   Short Term Goals: Week 1:  PT Short Term Goal 1 (Week 1): Pt will complete bed mobility with supervision PT Short Term Goal 2 (Week 1): Pt will completed bed<>chair transfers with CGA consistently PT Short Term Goal 3 (Week 1): Pt will ambulate 167ft with CGA and LRAD PT Short Term Goal 4 (Week 1): Pt will negotiated up/down 12 steps with CGA and LRAD  Skilled Therapeutic Interventions/Progress Updates:   Received pt semi-reclined in bed with significant other present at bedside, pt agreeable to PT treatment, and denied any pain during session. Session with emphasis on functional mobility/transfers, generalized strengthening, dynamic standing balance/coordination, gait training, NMR, motor control/sequencing, and improved activity tolerance. Pt transferred semi-reclined<>sitting EOB with supervision and donned shoes with mod A. Pt ambulated 164ft x 2 and 39ft x 2 without AD and CGA to/from 90M ortho gym. Pt required cues to relax LUE to improve arm swing. Pt also required mod cues for direction as pt unable to follow cues for turning L/R. Worked on dynamic standing balance kicking ball against wall without AD and CGA with emphasis on reaction time and sequencing. Transitioned to dynamic standing balance tossing horseshoes using LUE and CGA x 3 trials with emphasis on visual scanning and reciprocal movements. Pt required cues for visual scanning to locate target as pt attempting to hook horseshoes around leg of chair. Pt then began to stutter "I need, I need..." but unable to express to therapist what he needed but able to communicate urge to use restroom via yes/no questions. Pt ambulated 141ft x 2 trials without AD and CGA to/from bathroom. Pt able to stand and urinate with cues for  positioning as pt initially going towards trashcan. Noted blood in urine; pt stated it has been like this "since catheter". Stood at sink and washed hands with CGA and max cues for visual scanning/sequencing to turn on water, put hand under soap dispenser, and locate paper towels. Concluded session with pt sitting EOB with family present at bedside and NT performing bladder scan. RN notified of hematuria.   Therapy Documentation Precautions:  Precautions Precautions: Fall Precaution Comments: expressive and receptive aphasia Restrictions Weight Bearing Restrictions: No  Therapy/Group: Individual Therapy Martin Majestic PT, DPT   06/28/2020, 7:38 AM

## 2020-06-28 NOTE — Progress Notes (Signed)
PROGRESS NOTE   Subjective/Complaints: Having shower Tries to express some concern but is unable to despite trying hard  Seen ambulating well later in the day Cr rising to 1.31   ROS: limited due to aphasia.    Objective:   No results found. No results for input(s): WBC, HGB, HCT, PLT in the last 72 hours.  Recent Labs    06/28/20 0604  NA 139  K 3.8  CL 102  CO2 28  GLUCOSE 114*  BUN 15  CREATININE 1.31*  CALCIUM 8.9    Intake/Output Summary (Last 24 hours) at 06/28/2020 1354 Last data filed at 06/28/2020 1100 Gross per 24 hour  Intake 640 ml  Output 500 ml  Net 140 ml        Physical Exam: Vital Signs Blood pressure (!) 155/86, pulse 78, temperature (!) 97.3 F (36.3 C), resp. rate 19, height 6\' 1"  (1.854 m), weight 104.7 kg, SpO2 99 %.  Gen: no distress, normal appearing HEENT: oral mucosa pink and moist, NCAT Cardio: Reg rate Chest: normal effort, normal rate of breathing Abd: soft, non-distended   Ext: no clubbing, cyanosis, or edema Psych: pleasant and cooperative Skin: intact Neurologic: Cranial nerves II through XII intact, motor strength is 5/5 in bilateral deltoid, bicep, tricep, grip. Standing with good balance Aphasic- not consistent with Y/N, does seem to comprehend most basic information Sensory exam unable to assess due to aphasia    Musculoskeletal: Full range of motion in all 4 extremities. No joint swelling   Assessment/Plan: 1. Functional deficits which require 3+ hours per day of interdisciplinary therapy in a comprehensive inpatient rehab setting. Physiatrist is providing close team supervision and 24 hour management of active medical problems listed below. Physiatrist and rehab team continue to assess barriers to discharge/monitor patient progress toward functional and medical goals  Care Tool:  Bathing    Body parts bathed by patient: Right arm, Left arm, Right upper  leg, Right lower leg, Left upper leg, Face, Chest   Body parts bathed by helper: Right arm, Left arm, Front perineal area, Buttocks     Bathing assist Assist Level: Moderate Assistance - Patient 50 - 74%     Upper Body Dressing/Undressing Upper body dressing   What is the patient wearing?: Pull over shirt    Upper body assist Assist Level: Contact Guard/Touching assist    Lower Body Dressing/Undressing Lower body dressing      What is the patient wearing?: Incontinence brief, Pants     Lower body assist Assist for lower body dressing: Contact Guard/Touching assist     Toileting Toileting    Toileting assist Assist for toileting: Moderate Assistance - Patient 50 - 74%     Transfers Chair/bed transfer  Transfers assist     Chair/bed transfer assist level: Contact Guard/Touching assist     Locomotion Ambulation   Ambulation assist      Assist level: Contact Guard/Touching assist Assistive device: Other (comment) (wc as AD) Max distance: 300   Walk 10 feet activity   Assist     Assist level: Contact Guard/Touching assist Assistive device: Other (comment)   Walk 50 feet activity   Assist  Assist level: Contact Guard/Touching assist Assistive device: Other (comment)    Walk 150 feet activity   Assist    Assist level: Contact Guard/Touching assist Assistive device: Other (comment)    Walk 10 feet on uneven surface  activity   Assist     Assist level: Minimal Assistance - Patient > 75% Assistive device: Other (comment) (no Device)   Wheelchair     Assist Will patient use wheelchair at discharge?: No   Wheelchair activity did not occur: N/A         Wheelchair 50 feet with 2 turns activity    Assist    Wheelchair 50 feet with 2 turns activity did not occur: N/A       Wheelchair 150 feet activity     Assist  Wheelchair 150 feet activity did not occur: N/A       Blood pressure (!) 155/86, pulse 78,  temperature (!) 97.3 F (36.3 C), resp. rate 19, height 6\' 1"  (1.854 m), weight 104.7 kg, SpO2 99 %.    Medical Problem List and Plan: 1.  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  Impaired mobility -DVT/anticoagulation: D/c Lovenox since ambulating >500 feet             -antiplatelet therapy: Aspirin 81 mg daily and Brilinta 90 mg twice daily x30 days then aspirin 325 mg daily and Plavix 75 mg daily for another 2 months and then Plavix alone 3. Pain Management: Continue Tylenol as needed 4. Mood: Prozac 20 mg daily             -antipsychotic agents: N/A 5. Neuropsych: This patient is not capable of making decisions on his own behalf. 6. Skin/Wound Care: Routine skin checks 7. Fluids/Electrolytes/Nutrition:   Encourage po intake  8.  Diabetes mellitus.  Hemoglobin A1c 9.9.  Levemir 10 units daily.  Check blood sugars before meals and at bedtime.  Diabetic teaching  CBG (last 3)  Recent Labs    06/27/20 2122 06/28/20 0611 06/28/20 1158  GLUCAP 137* 130* 113*    -66/11- BG's overall adequate control- con't current regimen 9.  Seizure prophylaxis.  Keppra 1000 mg twice daily 10.  Hypertension.  Elevated. Increase Norvasc to 10mg  daily on 6/9, HCTZ 12.5 mg daily.     6/11- BP actually slightly soft this AM- no orthostasis- con't regimen 11.  Hyperlipidemia.  Lipitor 12.  History of tobacco use.  Counseling 13. Hypokalemia: still only 3.1, supplement 8/9 bid 6/11- recheck Monday  14. AKI: Cr increased 6/13, placed nursing order to encourage 6-8 glasses of water per day, repeat tomorrow.  15. Constipation  6/11- will give Sorbitol 60cc at 3pm after therapy- today and see if can get him to have BM's.   LOS: 5 days A FACE TO FACE EVALUATION WAS PERFORMED  7/13 06/28/2020,

## 2020-06-28 NOTE — Progress Notes (Signed)
Occupational Therapy Session Note  Patient Details  Name: Nathaniel Spears MRN: [id number] Date of Birth: July 07, 1975  Today's Date: 06/28/2020 OT Individual Time: 1282-0813 OT Individual Time Calculation (min): 59 min    Short Term Goals: Week 1:  OT Short Term Goal 1 (Week 1): patient will complete functional transfers and bed mobility with CS OT Short Term Goal 2 (Week 1): patient will complete adl tasks/grooming/bathing with min A and min cues for initiation, sequencing OT Short Term Goal 3 (Week 1): patient will complete basic meal prep 2-3 step task with CS and mod cues OT Short Term Goal 4 (Week 1): patient will scan right side of environment with min cues  Skilled Therapeutic Interventions/Progress Updates:   Met pt slightly reclined supine in bed, bed alarm on, pt agreed to session. Treatment session was focused on ADL retraining, functional tranfers and completing functional activities to improve cognition. Pt was supervision for lying > sitting EOB primarily holding bed rails to bring UB upright. Pt ambulated to bathroom with RW with CGA. Pt perseverated on RW when attempting to transfer over shower ledge. Pt was CGA for stepping over shower ledge. Pt was Mod A, max cues for UB and LB bathing. Pt perseverated on bathing lower arms required tactile cues for bathing remainder of body. Pt ambulated from shower chair > EOB using RW. Pt completed UB and LB dressing with CGA. OTS provided clothing and instructed pt to find the matching clothing pieces. Pt was able to match pants but required max cues to match shirts. Pt was able to identify clothing individually but once asked to scan room to find other shirt pt could not identify shirt. Once pt perseverated on objects, OTS removed object from direct site, pt able to redirect on asked task. Left pt slightly reclined in supine in bed, bed alarm on, all needs met.   Therapy Documentation Precautions:  Precautions Precautions: Fall Precaution  Comments: expressive and receptive aphasia Restrictions Weight Bearing Restrictions: No   Pain: Pain Assessment Pain Scale: 0-10 Pain Score: 0-No pain Faces Pain Scale: No hurt ADL: ADL Eating: Maximal assistance Where Assessed-Eating: Bed level Grooming: Minimal assistance, Maximal cueing Where Assessed-Grooming: Standing at sink Upper Body Bathing: Moderate assistance Where Assessed-Upper Body Bathing: Shower Lower Body Bathing: Moderate assistance Where Assessed-Lower Body Bathing: Shower Upper Body Dressing: Supervision/safety Where Assessed-Upper Body Dressing: Edge of bed Lower Body Dressing: Contact guard, Minimal cueing Where Assessed-Lower Body Dressing: Edge of bed Toileting: Moderate assistance Toilet Transfer: Minimal assistance Toilet Transfer Method: Ambulating Social research officer, government: Contact guard, Maximal cueing Social research officer, government Method: Radiographer, therapeutic: Shower seat with back ADL Comments: patient with impaired initiation, sequencing and perseveration   Therapy/Group: Individual Therapy  Nathaniel Spears 06/28/2020, 10:16 AM

## 2020-06-28 NOTE — Progress Notes (Signed)
Occupational Therapy Session Note  Patient Details  Name: Nathaniel Spears MRN: [id number] Date of Birth: Jul 25, 1975  Today's Date: 06/28/2020 OT Individual Time: 1130-1155 OT Individual Time Calculation (min): 25 min    Short Term Goals: Week 1:  OT Short Term Goal 1 (Week 1): patient will complete functional transfers and bed mobility with CS OT Short Term Goal 2 (Week 1): patient will complete adl tasks/grooming/bathing with min A and min cues for initiation, sequencing OT Short Term Goal 3 (Week 1): patient will complete basic meal prep 2-3 step task with CS and mod cues OT Short Term Goal 4 (Week 1): patient will scan right side of environment with min cues  Skilled Therapeutic Interventions/Progress Updates:    Pt resting in bed upon arrival and agreeable to getting OOB. Supine>sit EOB and don shoes with supervision. Pt attempted to communicate needs but became frustrated. Pt denied need for toileting. Amb with close supervision from room to and returned to room. Attempted to toss ball back and forth but pt demonstrated difficulty with motor planning to perform basic toss or chest pass. Pt presented with paper and pen. When questioned if he was Rt or Lt handed, pt stated he was Rt handed then used his Lt hand to write his first name. Pt only able to complete H A of last name and became frustrated and unable to complete. Pt unable to write last name when presented with example. Pt returned to bed. Pt remained in bed with all needs withn reach and bed alarm activated.   Therapy Documentation Precautions:  Precautions Precautions: Fall Precaution Comments: expressive and receptive aphasia Restrictions Weight Bearing Restrictions: No   Pain: Pt with no s/s of pain   Therapy/Group: Individual Therapy  [name] 06/28/2020, 12:01 PM

## 2020-06-28 NOTE — Progress Notes (Signed)
Speech Language Pathology Daily Session Note  Patient Details  Name: Nathaniel Spears MRN: [id number] Date of Birth: 04/12/1975  Today's Date: 06/28/2020 SLP Individual Time: 1030-1130 SLP Individual Time Calculation (min): 60 min  Short Term Goals: Week 1: SLP Short Term Goal 1 (Week 1): Patient will follow 1-step directions related to functional environment with 50% accuracy and Mod A verbal and visual cues SLP Short Term Goal 2 (Week 1): Patient will identify objects/object pictures within field of three with 50% accuracy and Mod A verbal and visual cues SLP Short Term Goal 3 (Week 1): Patient will verbally name relevant objects/pictures/people with 50% accuracy and Max A verbal and visual cues  (phonemic, first letter, and sentence completion cues.) SLP Short Term Goal 4 (Week 1): Patient will utilize multimodal communication Retail banker, writing?) during 4/5 opportunities with Mod A verbal and visual cues  Skilled Therapeutic Interventions: Skilled SLP intervention focused on language. Pt named 10/10 functional objects this session with min A verbal cues. He required mod A for formulating simple sentences to describe object function. Pt often perseverated on previously stated sentences and needed carrier phrase to increase accuracy. He was able to write first name with supervision A but needed max A visual model for pt to copy and write last name. Pt followed simple one step commands with 45% accuracy and max A. He repeated simple verbal instructions with minimal improvement in accuracy. Pt able to identify simple 3-4 letter words in a field of 2 with 60% accuracy. Cont with therapy per plan of care.     Pain Pain Assessment Pain Scale: Faces Pain Score: 0-No pain Faces Pain Scale: No hurt  Therapy/Group: Individual Therapy  Nathaniel Spears Nathaniel Spears 06/28/2020, 11:17 AM

## 2020-06-29 ENCOUNTER — Encounter: Payer: Self-pay | Admitting: Occupational Therapy

## 2020-06-29 LAB — GLUCOSE, CAPILLARY
Glucose-Capillary: 109 mg/dL — ABNORMAL HIGH (ref 70–99)
Glucose-Capillary: 150 mg/dL — ABNORMAL HIGH (ref 70–99)
Glucose-Capillary: 101 mg/dL — ABNORMAL HIGH (ref 70–99)
Glucose-Capillary: 100 mg/dL — ABNORMAL HIGH (ref 70–99)

## 2020-06-29 LAB — BASIC METABOLIC PANEL
Anion gap: 6 (ref 5–15)
BUN: 15 mg/dL (ref 6–20)
CO2: 26 mmol/L (ref 22–32)
Calcium: 9.1 mg/dL (ref 8.9–10.3)
Chloride: 106 mmol/L (ref 98–111)
Creatinine, Ser: 1.28 mg/dL — ABNORMAL HIGH (ref 0.61–1.24)
GFR, Estimated: >60 mL/min (ref >60)
Glucose, Bld: 111 mg/dL — ABNORMAL HIGH (ref 70–99)
Potassium: 3.6 mmol/L (ref 3.5–5.1)
Sodium: 138 mmol/L (ref 135–145)

## 2020-06-29 LAB — VITAMIN D 25 HYDROXY (VIT D DEFICIENCY, FRACTURES): Vit D, 25-Hydroxy: 15.24 ng/mL — ABNORMAL LOW (ref 30–100)

## 2020-06-29 LAB — MAGNESIUM: Magnesium: 2 mg/dL (ref 1.7–2.4)

## 2020-06-29 MED ORDER — POTASSIUM CHLORIDE 20 MEQ PO PACK
40.0000 meq | PACK | Freq: Once | ORAL | Status: AC
  Administered 2020-06-29: 40 meq via ORAL
  Filled 2020-06-29: qty 2

## 2020-06-29 NOTE — Progress Notes (Signed)
Occupational Therapy Session Note  Patient Details  Name: Nathaniel Spears MRN: [id number] Date of Birth: 1975-02-28  Today's Date: 06/29/2020 OT Individual Time: 0830-0900 OT Individual Time Calculation (min): 30 min    Short Term Goals: Week 1:  OT Short Term Goal 1 (Week 1): patient will complete functional transfers and bed mobility with CS OT Short Term Goal 2 (Week 1): patient will complete adl tasks/grooming/bathing with min A and min cues for initiation, sequencing OT Short Term Goal 3 (Week 1): patient will complete basic meal prep 2-3 step task with CS and mod cues OT Short Term Goal 4 (Week 1): patient will scan right side of environment with min cues  Skilled Therapeutic Interventions/Progress Updates:    Pt received in bed and had difficulty expressing himself, but I understood that he did not need to bathe/dress (he had done that yesterday) but did want to brush teeth.  Sat to EOB and donned shoes but had great difficulty with tying, unable to know how to even start task.  Removed one shoe and placed on his lap and demonstrated but unable to even initiate return demonstration. Ambulated to sink to brush teeth, needed set up help and tried to have pt brush teeth with R hand but kept switching to his L hand. He was able to brush his teeth this way.    To focus on Bilateral coordination, worked in standing on various 2 step tasks: -clapping hands together and then "slapping" hands on bathroom counter top for sensory feed back -clapping hands together and then 'slapping " hands on thighs. Initially, pt had great difficulty with task but was then able to follow 2 step directions.  His SO arrived at this point. In standing pt worked on coordination activity of folding washcloths and towels. He needed several repeated demonstrations but once he understood the task was able to continue without difficulty. Resting in recliner with all needs met. Belt alarm on.   Therapy  Documentation Precautions:  Precautions Precautions: Fall Precaution Comments: expressive and receptive aphasia Restrictions Weight Bearing Restrictions: No  Pain: Pain Assessment Pain Scale: Faces Pain Score: 0-No pain Faces Pain Scale: No hurt ADL: ADL Eating: Maximal assistance Where Assessed-Eating: Bed level Grooming: Minimal assistance, Maximal cueing Where Assessed-Grooming: Standing at sink Upper Body Bathing: Moderate assistance Where Assessed-Upper Body Bathing: Shower Lower Body Bathing: Moderate assistance Where Assessed-Lower Body Bathing: Shower Upper Body Dressing: Supervision/safety Where Assessed-Upper Body Dressing: Edge of bed Lower Body Dressing: Contact guard, Minimal cueing Where Assessed-Lower Body Dressing: Edge of bed Toileting: Moderate assistance Toilet Transfer: Minimal assistance Toilet Transfer Method: Ambulating Social research officer, government: Contact guard, Maximal cueing Social research officer, government Method: Radiographer, therapeutic: Shower seat with back ADL Comments: patient with impaired initiation, sequencing and perseveration  Therapy/Group: Individual Therapy  Blain 06/29/2020, 12:12 PM

## 2020-06-29 NOTE — Progress Notes (Signed)
Occupational Therapy Session Note  Patient Details  Name: Nathaniel Spears MRN: [id number] Date of Birth: 10-24-1975  Today's Date: 06/29/2020 OT Individual Time: 1102-1130 OT Individual Time Calculation (min): 28 min    Short Term Goals: Week 1:  OT Short Term Goal 1 (Week 1): patient will complete functional transfers and bed mobility with CS OT Short Term Goal 2 (Week 1): patient will complete adl tasks/grooming/bathing with min A and min cues for initiation, sequencing OT Short Term Goal 3 (Week 1): patient will complete basic meal prep 2-3 step task with CS and mod cues OT Short Term Goal 4 (Week 1): patient will scan right side of environment with min cues  Skilled Therapeutic Interventions/Progress Updates:    Pt received in room in recliner with family at bedside and consented to OT tx. Pt instructed in functional mobility in room with CGA and no device. Pt instructed to collect clothespins of specific colors at a time to increase scanning of environment, executive functioning, coordination, and reaching outside BOS to improve balance. Pt required max cuing to locate specific colors of clothespins in room, but was able to complete task with increased time. Pt instructed to reach at various levels to simulate IADL/home environment and for improved balance. No LOB noted during entire session. After tx, pt left in recliner with seatbelt alarm on and all needs met.   Therapy Documentation Precautions:  Precautions Precautions: Fall Precaution Comments: expressive and receptive aphasia Restrictions Weight Bearing Restrictions: No  Pain: Pain Assessment Pain Scale: Faces Pain Score: 0-No pain Faces Pain Scale: No hurt     Therapy/Group: Individual Therapy  Judeth Gilles 06/29/2020, 12:28 PM

## 2020-06-29 NOTE — Progress Notes (Signed)
Speech Language Pathology Daily Session Note  Patient Details  Name: Nathaniel Spears MRN: [id number] Date of Birth: 1975-12-21  Today's Date: 06/29/2020 SLP Individual Time: 0930-1015 SLP Individual Time Calculation (min): 45 min  Short Term Goals: Week 1: SLP Short Term Goal 1 (Week 1): Patient will follow 1-step directions related to functional environment with 50% accuracy and Mod A verbal and visual cues SLP Short Term Goal 2 (Week 1): Patient will identify objects/object pictures within field of three with 50% accuracy and Mod A verbal and visual cues SLP Short Term Goal 3 (Week 1): Patient will verbally name relevant objects/pictures/people with 50% accuracy and Max A verbal and visual cues  (phonemic, first letter, and sentence completion cues.) SLP Short Term Goal 4 (Week 1): Patient will utilize multimodal communication Retail banker, writing?) during 4/5 opportunities with Mod A verbal and visual cues  Skilled Therapeutic Interventions:   Skilled SLP intervention focused on language. Girlfriend present in room during session. He completed confrontational naming with 4/10 accuracy. He required carrier phrases and phonemic cues to increase naming with functional objects.Decreased accuracy with naming this session. Pt followed simple commands with field of 4 objects but decreased to field of 2 due to increased difficulty. Pt able to follow basic one step commands with 45% accuracy and mod A visual models. Pt able to write his first and last name with supervision A. He demonstrated increased difficulty with copying single letters. Educated wife on functional language tasks to complete with patient to increase expression and comprehension. Cont with therapy per plan of care.   Pain Pain Assessment Pain Scale: Faces Faces Pain Scale: No hurt  Therapy/Group: Individual Therapy  Carlean Jews Shallyn Constancio 06/29/2020, 10:11 AM

## 2020-06-29 NOTE — Progress Notes (Signed)
PROGRESS NOTE   Subjective/Complaints: No complaints Pleased with therapy Sitting upright in bed Alert and smiling   ROS: limited due to aphasia.    Objective:   No results found. No results for input(s): WBC, HGB, HCT, PLT in the last 72 hours.  Recent Labs    06/28/20 0604 06/29/20 0525  NA 139 138  K 3.8 3.6  CL 102 106  CO2 28 26  GLUCOSE 114* 111*  BUN 15 15  CREATININE 1.31* 1.28*  CALCIUM 8.9 9.1    Intake/Output Summary (Last 24 hours) at 06/29/2020 0919 Last data filed at 06/29/2020 0400 Gross per 24 hour  Intake 400 ml  Output 300 ml  Net 100 ml        Physical Exam: Vital Signs Blood pressure (!) 128/93, pulse 89, temperature 98.6 F (37 C), temperature source Oral, resp. rate 17, height 6\' 1"  (1.854 m), weight 104.7 kg, SpO2 97 %. Gen: no distress, normal appearing HEENT: oral mucosa pink and moist, NCAT Cardio: Reg rate Chest: normal effort, normal rate of breathing Abd: soft, non-distended    Ext: no clubbing, cyanosis, or edema Psych: pleasant and cooperative Skin: intact Neurologic: Cranial nerves II through XII intact, motor strength is 5/5 in bilateral deltoid, bicep, tricep, grip. Standing with good balance Aphasic- not consistent with Y/N, does seem to comprehend most basic information Sensory exam unable to assess due to aphasia    Musculoskeletal: Full range of motion in all 4 extremities. No joint swelling   Assessment/Plan: 1. Functional deficits which require 3+ hours per day of interdisciplinary therapy in a comprehensive inpatient rehab setting. Physiatrist is providing close team supervision and 24 hour management of active medical problems listed below. Physiatrist and rehab team continue to assess barriers to discharge/monitor patient progress toward functional and medical goals  Care Tool:  Bathing    Body parts bathed by patient: Right arm, Left arm, Right  upper leg, Right lower leg, Left upper leg, Face, Chest   Body parts bathed by helper: Right arm, Left arm, Front perineal area, Buttocks     Bathing assist Assist Level: Moderate Assistance - Patient 50 - 74%     Upper Body Dressing/Undressing Upper body dressing   What is the patient wearing?: Pull over shirt    Upper body assist Assist Level: Contact Guard/Touching assist    Lower Body Dressing/Undressing Lower body dressing      What is the patient wearing?: Incontinence brief, Pants     Lower body assist Assist for lower body dressing: Contact Guard/Touching assist     Toileting Toileting    Toileting assist Assist for toileting: Moderate Assistance - Patient 50 - 74%     Transfers Chair/bed transfer  Transfers assist     Chair/bed transfer assist level: Contact Guard/Touching assist     Locomotion Ambulation   Ambulation assist      Assist level: Contact Guard/Touching assist Assistive device: No Device Max distance: 152ft   Walk 10 feet activity   Assist     Assist level: Contact Guard/Touching assist Assistive device: No Device   Walk 50 feet activity   Assist    Assist level: Contact Guard/Touching assist  Assistive device: No Device    Walk 150 feet activity   Assist    Assist level: Contact Guard/Touching assist Assistive device: No Device    Walk 10 feet on uneven surface  activity   Assist     Assist level: Minimal Assistance - Patient > 75% Assistive device: Other (comment) (no Device)   Wheelchair     Assist Will patient use wheelchair at discharge?: No   Wheelchair activity did not occur: N/A         Wheelchair 50 feet with 2 turns activity    Assist    Wheelchair 50 feet with 2 turns activity did not occur: N/A       Wheelchair 150 feet activity     Assist  Wheelchair 150 feet activity did not occur: N/A       Blood pressure (!) 128/93, pulse 89, temperature 98.6 F (37 C),  temperature source Oral, resp. rate 17, height 6\' 1"  (1.854 m), weight 104.7 kg, SpO2 97 %.    Medical Problem List and Plan: 1.  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  Impaired mobility -DVT/anticoagulation: D/c Lovenox since ambulating >500 feet             -antiplatelet therapy: Continue Aspirin 81 mg daily and Brilinta 90 mg twice daily x30 days then aspirin 325 mg daily and Plavix 75 mg daily for another 2 months and then Plavix alone 3. Pain Management: Continue Tylenol as needed 4. Mood: Prozac 20 mg daily             -antipsychotic agents: N/A 5. Neuropsych: This patient is not capable of making decisions on his own behalf. 6. Skin/Wound Care: Routine skin checks 7. Fluids/Electrolytes/Nutrition:   Encourage po intake  8.  Diabetes mellitus.  Hemoglobin A1c 9.9.  Levemir 10 units daily.  Check blood sugars before meals and at bedtime.  Diabetic teaching  CBG (last 3)  Recent Labs    06/28/20 1622 06/28/20 2102 06/29/20 0606  GLUCAP 108* 140* 109*    -6/14 well controlled: continue current regimen 9.  Seizure prophylaxis.  Keppra 1000 mg twice daily 10.  Hypertension.  Elevated. Increase Norvasc to 10mg  daily on 6/9, HCTZ 12.5 mg daily.     6/11- BP actually slightly soft this AM- no orthostasis- con't regimen 11.  Hyperlipidemia.  Lipitor 12.  History of tobacco use.  Counseling 13. Hypokalemia: improved to 3.6, additional 8/9 today and repeat tomorrow. 14. AKI: improving but still elevated, repeat tomorrow, placed nursing order to encourage 6-8 glasses of water per day, repeat tomorrow.  15. Constipation  6/11- will give Sorbitol 60cc at 3pm after therapy- today and see if can get him to have BM's.  16. Disposition: HFU scheduled with Dr. on 8/11 1140AM.   LOS: 6 days A FACE TO FACE  EVALUATION WAS PERFORMED  Carlis Abbott 06/29/2020,

## 2020-06-29 NOTE — Progress Notes (Signed)
Occupational Therapy Session Note  Patient Details  Name: Nathaniel Spears MRN: [id number] Date of Birth: 1975-06-21  Today's Date: 06/29/2020 OT Individual Time: 1300-1355 OT Individual Time Calculation (min): 55 min    Short Term Goals: Week 1:  OT Short Term Goal 1 (Week 1): patient will complete functional transfers and bed mobility with CS OT Short Term Goal 2 (Week 1): patient will complete adl tasks/grooming/bathing with min A and min cues for initiation, sequencing OT Short Term Goal 3 (Week 1): patient will complete basic meal prep 2-3 step task with CS and mod cues OT Short Term Goal 4 (Week 1): patient will scan right side of environment with min cues  Skilled Therapeutic Interventions/Progress Updates:    Patient seated in recliner, alert and ready for therapy.  Yes/no responses consistent.  Occ able to express wants and needs.  Able to name family members 75%.  Sit to stand and ambulation without AD to/from recliner, w/c, toilet with CS.  Completed standing card ID/sorting task - improved sequencing verbally and able to place cards in order with increased time.  Able to stand x 2 for 10+ minutes.  Cues to complete toileting - he was continent of bowel, able to pull pants down with supervision, max/dep for hygiene, donned clean pull up with mod A, donned shorts with min A, pulling pants over hips with CS.  Hand hygiene with CS and cues.  He returned to recliner at close of session.  Seat belt alarm set and call bell in hand.    Therapy Documentation Precautions:  Precautions Precautions: Fall Precaution Comments: expressive and receptive aphasia Restrictions Weight Bearing Restrictions: No   Therapy/Group: Individual Therapy  Barrie Lyme 06/29/2020, 7:44 AM

## 2020-06-29 NOTE — Patient Care Conference (Addendum)
Inpatient RehabilitationTeam Conference and Plan of Care Update Date: 06/29/2020   Time: 13:38 PM     Patient Name: Nathaniel Spears      Medical Record Number: [id number]  Date of Birth: May 26, 1975 Sex: Male         Room/Bed: 5C06C/5C06C-01 Payor Info: Payor: /    Admit Date/Time:  06/23/2020  3:43 PM  Primary Diagnosis:  Acute ischemic stroke [facility])  Hospital Problems: Principal Problem:   Acute ischemic stroke [facility]) Active Problems:   Middle cerebral artery embolism, bilateral    Expected Discharge Date: Expected Discharge Date: 07/08/20  Team Members Present: Physician leading conference: Dr. Sula Soda Care Coodinator Present: Chana Bode, RN, BSN, CRRN;Christina [place], BSW Nurse Present: Chana Bode, RN PT Present: Raechel Chute, PT OT Present: Rosalio Loud, OT SLP Present: Colin Benton, SLP PPS Coordinator present : Edson Snowball, PT     Current Status/Progress Goal Weekly Team Focus  Bowel/Bladder   Pt overall cont of B/B but requires coaching to use restroom otherwise will not go. LBM 06/27/20.  Pt gain cont and regularity of B/B without coaching  [place] pt Q2h and PRN   Swallow/Nutrition/ Hydration             ADL's   Mod A and max cueing for UB and LB bathing, pt frequently peseverates but is easily redirected once object is removed from sight, supervision UB dressing, CGA LB dressing, Min A grooming. Pt gets frustrated with lack of communication of needs due to aphasia. Pt benefits from closed ended questions.  supervision  ADL retraining, functional cognition, balance while multi-tasking   Mobility   bed mobility supervision, transfers without AD CGA, gait >145ft without AD CGA, 12 steps 2 rails min A  supervision, mod I transfers  functional mobility/transfers, generalized strengthening, dynamic standing balance/coordination, gait training, NMR, motor control/sequencing, family education, and improved activity tolerance.   Communication   max A for  following simple commands and verbal expression  min A for comprehension and mod A for verbal expression  formulating sentences, copying letters, naming, functional phrases.   Safety/Cognition/ Behavioral Observations  min A  Supervision A  problem solving   Pain   Pt states no pain  Pt remains pain free  Assess for pain qshift and provide PRN as needed   Skin   Skin is clean and intact  skin remain clean and intact  Assess skin qshift and provide skin care as needed     Discharge Planning:  Discharginh home with S.O.   Team Discussion: Still working on aphasia. AKI improved; enc fluids.  Patient on target to meet rehab goals: yes, currently transfers CGA. Able to ambulate >300' without an assistive device at [facility] and manage 12 steps. Requires max cues for sequencing, order, and complete self care, get out of the shower, bathing sequencing, etc. Patient perseverates and requires hand over hand or cueing to redirect even with familiar functional tasks. Requires max assist for receptive and expressive communication. Goals set for supervision to Mod I assist for discharge.  *See Care Plan and progress notes for long and short-term goals.   Revisions to Treatment Plan:  Working on naming, writing, problem solving familiar functional tasks  Teaching Needs: Cues for safety, medication management/insulin administration, etc.  Current Barriers to Discharge: Decreased caregiver support and Home enviroment access/layout  Possible Resolutions to Barriers: Family education completed 06/29/20 with girlfriend     Medical Summary  I attest that I was present, lead the team conference, and concur with the assessment and plan of the team.   Chana Bode B 06/29/2020, 2:00 PM

## 2020-06-29 NOTE — Progress Notes (Signed)
Physical Therapy Session Note  Patient Details  Name: Nathaniel Spears MRN: [id number] Date of Birth: Jun 27, 1975  Today's Date: 06/29/2020 PT Individual Time: 4315-4008 PT Individual Time Calculation (min): 23 min   Short Term Goals: Week 1:  PT Short Term Goal 1 (Week 1): Pt will complete bed mobility with supervision PT Short Term Goal 2 (Week 1): Pt will completed bed<>chair transfers with CGA consistently PT Short Term Goal 3 (Week 1): Pt will ambulate 166ft with CGA and LRAD PT Short Term Goal 4 (Week 1): Pt will negotiated up/down 12 steps with CGA and LRAD  Skilled Therapeutic Interventions/Progress Updates:   Received pt sitting in recliner, pt agreeable to PT treatment, and denied any pain during session. Session with emphasis on functional mobility/transfers, generalized strengthening, dynamic standing balance/coordination, sequencing, gait training, and improved activity tolerance. Pt able to tell therapist he had "grilled chicken" for lunch but with increased difficulty word finding "peas" despite visual cues and increased time. Pt ambulated >540ft without AD and CGA to/from [place] tower with multiple standing rest breaks. Challenged pt with stating color of exit sign while walking but pt unable to state color "red" demonstrating slurred and dysarthria. Stood at windows and challenged pt with naming 1 item outside, however pt unable and repeating therapist's question "can you name 1 thing you see outside". Concluded session with pt sitting in recliner, needs within reach, and seatbelt alarm on.   Therapy Documentation Precautions:  Precautions Precautions: Fall Precaution Comments: expressive and receptive aphasia Restrictions Weight Bearing Restrictions: No  Therapy/Group: Individual Therapy Martin Majestic PT, DPT   06/29/2020, 7:31 AM

## 2020-06-29 NOTE — Progress Notes (Signed)
Patient ID: Nathaniel Spears, male   DOB: 10/02/1975, 45 y.o.   MRN: [id number] Team Conference Report to Patient/Family  Team Conference discussion was reviewed with the patient and caregiver, including goals, any changes in plan of care and target discharge date.  Patient and caregiver express understanding and are in agreement.  The patient has a target discharge date of 07/08/20.  SW met with patient and called sister at bedside. Provided conference updates. Pt sister has medical question, will follow up with Dr. Ranell Patrick no additional questions or concerns, sw will follow up.   Dyanne Iha 06/29/2020, 2:51 PM

## 2020-06-30 LAB — GLUCOSE, CAPILLARY
Glucose-Capillary: 105 mg/dL — ABNORMAL HIGH (ref 70–99)
Glucose-Capillary: 110 mg/dL — ABNORMAL HIGH (ref 70–99)
Glucose-Capillary: 149 mg/dL — ABNORMAL HIGH (ref 70–99)
Glucose-Capillary: 99 mg/dL (ref 70–99)

## 2020-06-30 LAB — BASIC METABOLIC PANEL
Anion gap: 10 (ref 5–15)
BUN: 14 mg/dL (ref 6–20)
CO2: 24 mmol/L (ref 22–32)
Calcium: 9.5 mg/dL (ref 8.9–10.3)
Chloride: 104 mmol/L (ref 98–111)
Creatinine, Ser: 1.15 mg/dL (ref 0.61–1.24)
GFR, Estimated: >60 mL/min (ref >60)
Glucose, Bld: 105 mg/dL — ABNORMAL HIGH (ref 70–99)
Potassium: 3.9 mmol/L (ref 3.5–5.1)
Sodium: 138 mmol/L (ref 135–145)

## 2020-06-30 NOTE — Progress Notes (Signed)
Speech Language Pathology Daily Session Note  Patient Details  Name: Nathaniel Spears MRN: [id number] Date of Birth: May 23, 1975  Today's Date: 06/30/2020 SLP Individual Time: 1100-1200 SLP Individual Time Calculation (min): 60 min  Short Term Goals: Week 1: SLP Short Term Goal 1 (Week 1): Patient will follow 1-step directions related to functional environment with 50% accuracy and Mod A verbal and visual cues SLP Short Term Goal 2 (Week 1): Patient will identify objects/object pictures within field of three with 50% accuracy and Mod A verbal and visual cues SLP Short Term Goal 3 (Week 1): Patient will verbally name relevant objects/pictures/people with 50% accuracy and Max A verbal and visual cues  (phonemic, first letter, and sentence completion cues.) SLP Short Term Goal 4 (Week 1): Patient will utilize multimodal communication Retail banker, writing?) during 4/5 opportunities with Mod A verbal and visual cues  Skilled Therapeutic Interventions: Skilled SLP treatment performed with focus on language goals. Patient required assistance with personal care and toileting following large bowel incontinence episode. Patient able to communicate functional needs with single words (e.g., bathroom, Harriette, phone) after multiple verbal attempts and semantic paraphasias. Patient easily frustrated with word finding difficulty today, although understandable due to prior bathroom situation. Patient was able to follow approximately 50% of 1-step directions with Mod A verbal cues, visual models, and occasional tactile support (hand-over-hand). Mod-to-max assist verbal and visual models for sequencing of washing hands at sink, and lower body dressing. Patient was left in recliner with alarm activated and needs within reach. Continue SLP plan of care.  Pain Pain Assessment Pain Scale: 0-10 Pain Score: 0-No pain  Therapy/Group: Individual Therapy  Tamala Ser 06/30/2020, 2:26 PM

## 2020-06-30 NOTE — Progress Notes (Signed)
Occupational Therapy Session Note  Patient Details  Name: Nathaniel Spears MRN: [id number] Date of Birth: 08-08-1975  Today's Date: 06/30/2020 OT Individual Time: 9924-2683 OT Individual Time Calculation (min): 45 min    Short Term Goals: Week 1:  OT Short Term Goal 1 (Week 1): patient will complete functional transfers and bed mobility with CS OT Short Term Goal 2 (Week 1): patient will complete adl tasks/grooming/bathing with min A and min cues for initiation, sequencing OT Short Term Goal 3 (Week 1): patient will complete basic meal prep 2-3 step task with CS and mod cues OT Short Term Goal 4 (Week 1): patient will scan right side of environment with min cues  Skilled Therapeutic Interventions/Progress Updates:    Treatment session with focus on functional mobility in kitchen with focus on obtaining familiar items to complete meal prep task.  Pt received upright with SO present in room.  Pt ambulated short distances and around the ADL kitchen with CGA to close supervision without AD.  Therapist directed pt to locate familiar items in kitchen.  Pt required max multimodal cues to locate items.  Pt requiring picture cue for each item and frequently requiring choice of 2 to select correct item.  Pt did report "lactose intolerant" when asked to locate milk in refrigerator.  Pt frequently repeating words when asked to locate them but still requiring max multimodal cues and frequently choice between 2.  Pt engaged in ball toss and basketball activity with focus on increased incorporation of RUE into toss and catch.  Incorporated reaching outside BOS and crossing midline to increase use of RUE and visual scanning.  Pt returned to room and left upright in recliner with seat belt alarm on, all needs in reach, and SO present.  Therapy Documentation Precautions:  Precautions Precautions: Fall Precaution Comments: expressive and receptive aphasia Restrictions Weight Bearing Restrictions: No General:    Vital Signs: Therapy Vitals Temp: 98.4 F (36.9 C) Temp Source: Oral Pulse Rate: 79 Resp: 19 BP: 109/82 Patient Position (if appropriate): Sitting Oxygen Therapy SpO2: 99 % O2 Device: Room Air Pain: Pain Assessment Pain Scale: 0-10 Pain Score: 0-No pain    Therapy/Group: Individual Therapy  Rosalio Loud 06/30/2020, 3:10 PM

## 2020-06-30 NOTE — Progress Notes (Signed)
PROGRESS NOTE   Subjective/Complaints: Ambulating much better! Tries to express a complaint but still with very severe expressive aphasia, though improving! Frustrated by inability to communicate   ROS: limited due to aphasia.    Objective:   No results found. No results for input(s): WBC, HGB, HCT, PLT in the last 72 hours.  Recent Labs    06/29/20 0525 06/30/20 0647  NA 138 138  K 3.6 3.9  CL 106 104  CO2 26 24  GLUCOSE 111* 105*  BUN 15 14  CREATININE 1.28* 1.15  CALCIUM 9.1 9.5    Intake/Output Summary (Last 24 hours) at 06/30/2020 1205 Last data filed at 06/30/2020 0727 Gross per 24 hour  Intake 238 ml  Output 175 ml  Net 63 ml        Physical Exam: Vital Signs Blood pressure (!) 123/95, pulse 75, temperature 98 F (36.7 C), temperature source Oral, resp. rate 18, height 6\' 1"  (1.854 m), weight 104.7 kg, SpO2 98 %. Gen: no distress, normal appearing HEENT: oral mucosa pink and moist, NCAT Cardio: Reg rate Chest: normal effort, normal rate of breathing Abd: soft, non-distended   Ext: no clubbing, cyanosis, or edema Psych: pleasant and cooperative Skin: intact Neurologic: Cranial nerves II through XII intact, motor strength is 5/5 in bilateral deltoid, bicep, tricep, grip. Standing with good balance Aphasic- not consistent with Y/N, does seem to comprehend most basic information Sensory exam unable to assess due to aphasia    Musculoskeletal: Full range of motion in all 4 extremities. No joint swelling   Assessment/Plan: 1. Functional deficits which require 3+ hours per day of interdisciplinary therapy in a comprehensive inpatient rehab setting. Physiatrist is providing close team supervision and 24 hour management of active medical problems listed below. Physiatrist and rehab team continue to assess barriers to discharge/monitor patient progress toward functional and medical goals  Care  Tool:  Bathing    Body parts bathed by patient: Right arm, Left arm, Right upper leg, Right lower leg, Left upper leg, Face, Chest   Body parts bathed by helper: Right arm, Left arm, Front perineal area, Buttocks     Bathing assist Assist Level: Moderate Assistance - Patient 50 - 74%     Upper Body Dressing/Undressing Upper body dressing   What is the patient wearing?: Pull over shirt    Upper body assist Assist Level: Contact Guard/Touching assist    Lower Body Dressing/Undressing Lower body dressing      What is the patient wearing?: Underwear/pull up, Pants     Lower body assist Assist for lower body dressing: Moderate Assistance - Patient 50 - 74%     Toileting Toileting    Toileting assist Assist for toileting: Moderate Assistance - Patient 50 - 74%     Transfers Chair/bed transfer  Transfers assist     Chair/bed transfer assist level: Contact Guard/Touching assist     Locomotion Ambulation   Ambulation assist      Assist level: Contact Guard/Touching assist Assistive device: No Device Max distance: >164ft   Walk 10 feet activity   Assist     Assist level: Contact Guard/Touching assist Assistive device: Walker-rolling   Walk 50 feet  activity   Assist    Assist level: Contact Guard/Touching assist Assistive device: Walker-rolling    Walk 150 feet activity   Assist    Assist level: Contact Guard/Touching assist Assistive device: Walker-rolling    Walk 10 feet on uneven surface  activity   Assist     Assist level: Minimal Assistance - Patient > 75% Assistive device: Other (comment) (no Device)   Wheelchair     Assist Will patient use wheelchair at discharge?: No   Wheelchair activity did not occur: N/A         Wheelchair 50 feet with 2 turns activity    Assist    Wheelchair 50 feet with 2 turns activity did not occur: N/A       Wheelchair 150 feet activity     Assist  Wheelchair 150 feet  activity did not occur: N/A       Blood pressure (!) 123/95, pulse 75, temperature 98 F (36.7 C), temperature source Oral, resp. rate 18, height 6\' 1"  (1.854 m), weight 104.7 kg, SpO2 98 %.    Medical Problem List and Plan: 1.  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  Impaired mobility -DVT/anticoagulation: D/c Lovenox since ambulating >500 feet             -antiplatelet therapy: Continue Aspirin 81 mg daily and Brilinta 90 mg twice daily x30 days then aspirin 325 mg daily and Plavix 75 mg daily for another 2 months and then Plavix alone 3. Pain Management: Continue Tylenol as needed 4. Mood: Continue Prozac 20 mg daily             -antipsychotic agents: N/A 5. Neuropsych: This patient is not capable of making decisions on his own behalf. 6. Skin/Wound Care: Routine skin checks 7. Fluids/Electrolytes/Nutrition:   Encourage po intake  8.  Diabetes mellitus.  Hemoglobin A1c 9.9.  Levemir 10 units daily.  Check blood sugars before meals and at bedtime.  Diabetic teaching  CBG (last 3)  Recent Labs    06/29/20 2051 06/30/20 0601 06/30/20 1144  GLUCAP 100* 105* 110*    -6/14 well controlled: continue current regimen 9.  Seizure prophylaxis.  Keppra 1000 mg twice daily 10.  Hypertension.  Elevated. Increase Norvasc to 10mg  daily on 6/9, HCTZ 12.5 mg daily.     6/11- BP actually slightly soft this AM- no orthostasis- con't regimen 11.  Hyperlipidemia.  Lipitor 12.  History of tobacco use.  Counseling 13. Hypokalemia: improved to 3.6, additional 8/9 today and repeat tomorrow. 14. AKI: resolved. Placed nursing order to encourage 6-8 glasses of water per day, repeat Monday 15. Constipation  6/11- will give Sorbitol 60cc at 3pm after therapy- today and see if can get him to have BM's.  16. Disposition: HFU  scheduled with Dr. Thursday on 8/11. D/c 6/23   LOS: 7 days A FACE TO FACE EVALUATION WAS PERFORMED  [id number] Nathaniel Spears 06/30/2020,

## 2020-06-30 NOTE — Progress Notes (Signed)
Physical Therapy Session Note  Patient Details  Name: Nathaniel Spears MRN: [id number] Date of Birth: 1975/07/11  Today's Date: 06/30/2020 PT Individual Time: 0800-0910 PT Individual Time Calculation (min): 70 min   Short Term Goals: Week 1:  PT Short Term Goal 1 (Week 1): Pt will complete bed mobility with supervision PT Short Term Goal 2 (Week 1): Pt will completed bed<>chair transfers with CGA consistently PT Short Term Goal 3 (Week 1): Pt will ambulate 131ft with CGA and LRAD PT Short Term Goal 4 (Week 1): Pt will negotiated up/down 12 steps with CGA and LRAD  Skilled Therapeutic Interventions/Progress Updates:   Received pt sitting EOB, pt agreeable to PT treatment, and denied any pain during session. Session with emphasis on functional mobility/transfers, generalized strengthening, dynamic standing balance/coordination, gait training, toileting, dressing, motor control/coordination, sequencing, and improved activity tolerance. Pt reported urge to urinate and ambulated 56ft without AD and supervision to bathroom. Pt with increased difficulty with sequencing/motor planning and urinating on clothes and all over bathroom floor while standing. Returned to bed and doffed soiled brief, pants, and socks with total A. Donned clean brief and pants sitting EOB with mod A and pt able to stand and pull brief/pants over hips with CGA/close supervision. Donned clean socks with supervision and increased time with cues for sequencing. Doffed dirty shirt with max A and clean one with mod A and donned shoes with supervision and cues to tie laces. Pt ambulated 107ft without AD and supervision to sink and stood and brushed teeth with mod/max cues for sequencing and visual scanning to locate and use toothpaste. Pt ambulated 16ft x 2 and 116ft x 2 without AD and CGA/close supervision to/from 4W therapy gym; cues to increase cadence and promote arm swing. Pt navigated 12 steps with 2 rails and supervision (occasional CGA  to hold up pants) ascending and descending with a step through pattern. Pt required mod cues for sequencing as pt initially walking past staircase towards hot packs. Made belt for pt using stockinette to support pants. Worked on dynamic standing balance bouncing ball against rebounder with emphasis on sequencing and reaction time with CGA for balance 2x20 reps. Pt able to pick up ball from floor twice with CGA and no LOB but unable to complete SLS when challenged with this task. Performed obstacle course weaving in/out cones with CGA x 2 trials with mod cues for technique on trial 1 and min cues on trial 2 with CGA and no LOB noted. Ambulated additional 82ft without AD CGA and worked on sequencing, motor planning, and fine motor control standing and copying pictures on pegboard with max cues for sequencing. MD present for morning rounds. Concluded session with pt sitting in recliner, needs within reach, and seatbelt alarm on. NT present at bedside changing linens.   Therapy Documentation Precautions:  Precautions Precautions: Fall Precaution Comments: expressive and receptive aphasia Restrictions Weight Bearing Restrictions: No  Therapy/Group: Individual Therapy Martin Majestic PT, DPT   06/30/2020, 7:20 AM

## 2020-07-01 ENCOUNTER — Ambulatory Visit (INDEPENDENT_AMBULATORY_CARE_PROVIDER_SITE_OTHER): Payer: Self-pay | Admitting: Primary Care

## 2020-07-01 ENCOUNTER — Encounter: Payer: Self-pay | Admitting: Occupational Therapy

## 2020-07-01 LAB — GLUCOSE, CAPILLARY
Glucose-Capillary: 106 mg/dL — ABNORMAL HIGH (ref 70–99)
Glucose-Capillary: 106 mg/dL — ABNORMAL HIGH (ref 70–99)
Glucose-Capillary: 138 mg/dL — ABNORMAL HIGH (ref 70–99)
Glucose-Capillary: 112 mg/dL — ABNORMAL HIGH (ref 70–99)

## 2020-07-01 MED ORDER — POTASSIUM CHLORIDE 20 MEQ PO PACK
20.0000 meq | PACK | Freq: Once | ORAL | Status: AC
  Administered 2020-07-01: 20 meq via ORAL
  Filled 2020-07-01: qty 1

## 2020-07-01 MED ORDER — INSULIN DETEMIR 100 UNIT/ML ~~LOC~~ SOLN
5.0000 [IU] | Freq: Every day | SUBCUTANEOUS | Status: DC
  Administered 2020-07-02 – 2020-07-03 (×2): 5 [IU] via SUBCUTANEOUS
  Filled 2020-07-01 (×6): qty 0.05

## 2020-07-01 MED ORDER — METFORMIN HCL 500 MG PO TABS
500.0000 mg | ORAL_TABLET | Freq: Every day | ORAL | Status: DC
  Administered 2020-07-01 – 2020-07-02 (×2): 500 mg via ORAL
  Filled 2020-07-01 (×2): qty 1

## 2020-07-01 MED ORDER — VITAMIN D (ERGOCALCIFEROL) 1.25 MG (50000 UNIT) PO CAPS
50000.0000 [IU] | ORAL_CAPSULE | ORAL | Status: DC
  Administered 2020-07-01: 50000 [IU] via ORAL
  Filled 2020-07-01 (×2): qty 1

## 2020-07-01 NOTE — Progress Notes (Signed)
Occupational Therapy Session Note  Patient Details  Name: Nathaniel Spears MRN: [id number] Date of Birth: [birth year]  Today's Date: 07/01/2020 OT Individual Time: 0867-6195 OT Individual Time Calculation (min): 58 min    Short Term Goals: Week 1:  OT Short Term Goal 1 (Week 1): patient will complete functional transfers and bed mobility with CS OT Short Term Goal 2 (Week 1): patient will complete adl tasks/grooming/bathing with min A and min cues for initiation, sequencing OT Short Term Goal 3 (Week 1): patient will complete basic meal prep 2-3 step task with CS and mod cues OT Short Term Goal 4 (Week 1): patient will scan right side of environment with min cues  Skilled Therapeutic Interventions/Progress Updates:    Patient seated in recliner, pleasant, cooperative, ongoing difficulty with expressing wants and needs.  He denies pain or need for toileting/adl activities at this time.  Sit to stand and ambulation in room without AD to/from recliner and w/c with CS/CGA.  Donned teds and sneakers seated w/c level with min A to motor plan and initiate teds, min A to slide feet into sneakers.  SPT to/from nustep with CGA - completed nustep at level 4 for 5 minutes.   Completed scavenger hunt for colored bean bags in therapy gym - max cues to identify and count bean bags prior to placing them around gym space.  Max cues to locate and motor plan reaching for bean bags and placing them in basket.  Unable to compare bean bags found with list created at start.  He navigated environment with cues to avoid obstacles - no LOB.  He returned to bed at close of session with CS.  He appears frustrated at times with expressive difficulty - offered toileting, he denies pain.  He remained in bed at close of session, bed alarm set and call bell in hand.    Therapy Documentation Precautions:  Precautions Precautions: Fall Precaution Comments: expressive and receptive aphasia Restrictions Weight Bearing Restrictions:  No   Therapy/Group: Individual Therapy  Barrie Lyme 07/01/2020, 7:43 AM

## 2020-07-01 NOTE — Progress Notes (Signed)
PROGRESS NOTE   Subjective/Complaints: No complaints this morning Severe expressive aphasia limits his communication Smiling Sitting in chair comfortably.    ROS: limited due to aphasia.    Objective:   No results found. No results for input(s): WBC, HGB, HCT, PLT in the last 72 hours.  Recent Labs    06/29/20 0525 06/30/20 0647  NA 138 138  K 3.6 3.9  CL 106 104  CO2 26 24  GLUCOSE 111* 105*  BUN 15 14  CREATININE 1.28* 1.15  CALCIUM 9.1 9.5    Intake/Output Summary (Last 24 hours) at 07/01/2020 1301 Last data filed at 07/01/2020 0853 Gross per 24 hour  Intake 118 ml  Output --  Net 118 ml        Physical Exam: Vital Signs Blood pressure 120/81, pulse 75, temperature 97.9 F (36.6 C), resp. rate 19, height 6\' 1"  (1.854 m), weight 104.7 kg, SpO2 97 %. Gen: no distress, normal appearing HEENT: oral mucosa pink and moist, NCAT Cardio: Reg rate Chest: normal effort, normal rate of breathing Abd: soft, non-distended  Ext: no clubbing, cyanosis, or edema Psych: pleasant and cooperative Skin: intact Neurologic: Cranial nerves II through XII intact, motor strength is 5/5 in bilateral deltoid, bicep, tricep, grip. Standing with good balance Aphasic- not consistent with Y/N, does seem to comprehend most basic information Sensory exam unable to assess due to aphasia    Musculoskeletal: Full range of motion in all 4 extremities. No joint swelling   Assessment/Plan: 1. Functional deficits which require 3+ hours per day of interdisciplinary therapy in a comprehensive inpatient rehab setting. Physiatrist is providing close team supervision and 24 hour management of active medical problems listed below. Physiatrist and rehab team continue to assess barriers to discharge/monitor patient progress toward functional and medical goals  Care Tool:  Bathing    Body parts bathed by patient: Right arm, Left arm,  Right upper leg, Right lower leg, Left upper leg, Face, Chest   Body parts bathed by helper: Right arm, Left arm, Front perineal area, Buttocks     Bathing assist Assist Level: Moderate Assistance - Patient 50 - 74%     Upper Body Dressing/Undressing Upper body dressing   What is the patient wearing?: Pull over shirt    Upper body assist Assist Level: Supervision/Verbal cueing    Lower Body Dressing/Undressing Lower body dressing      What is the patient wearing?: Underwear/pull up, Pants     Lower body assist Assist for lower body dressing: Moderate Assistance - Patient 50 - 74%     Toileting Toileting    Toileting assist Assist for toileting: Moderate Assistance - Patient 50 - 74%     Transfers Chair/bed transfer  Transfers assist     Chair/bed transfer assist level: Contact Guard/Touching assist     Locomotion Ambulation   Ambulation assist      Assist level: Contact Guard/Touching assist Assistive device: No Device Max distance: >164ft   Walk 10 feet activity   Assist     Assist level: Contact Guard/Touching assist Assistive device: Walker-rolling   Walk 50 feet activity   Assist    Assist level: Contact Guard/Touching assist Assistive  device: Walker-rolling    Walk 150 feet activity   Assist    Assist level: Contact Guard/Touching assist Assistive device: Walker-rolling    Walk 10 feet on uneven surface  activity   Assist     Assist level: Minimal Assistance - Patient > 75% Assistive device: Other (comment) (no Device)   Wheelchair     Assist Will patient use wheelchair at discharge?: No   Wheelchair activity did not occur: N/A         Wheelchair 50 feet with 2 turns activity    Assist    Wheelchair 50 feet with 2 turns activity did not occur: N/A       Wheelchair 150 feet activity     Assist  Wheelchair 150 feet activity did not occur: N/A       Blood pressure 120/81, pulse 75, temperature  97.9 F (36.6 C), resp. rate 19, height 6\' 1"  (1.854 m), weight 104.7 kg, SpO2 97 %.    Medical Problem List and Plan: 1.  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  Impaired mobility -DVT/anticoagulation: D/c Lovenox since ambulating >500 feet             -antiplatelet therapy: Continue Aspirin 81 mg daily and Brilinta 90 mg twice daily x30 days then aspirin 325 mg daily and Plavix 75 mg daily for another 2 months and then Plavix alone 3. Pain Management: Continue Tylenol as needed 4. Mood: Continue Prozac 20 mg daily             -antipsychotic agents: N/A 5. Neuropsych: This patient is not capable of making decisions on his own behalf. 6. Skin/Wound Care: Routine skin checks 7. Fluids/Electrolytes/Nutrition:   Encourage po intake  8.  Diabetes mellitus.  Hemoglobin A1c 9.9.  Levemir 10 units daily.  Check blood sugars before meals and at bedtime.  Diabetic teaching  CBG (last 3)  Recent Labs    06/30/20 1941 07/01/20 0522 07/01/20 1138  GLUCAP 99 106* 106*    -6/16 well controlled: start metformin 500mg , decrease levemir to 5U, and repeat Cr tomorrow.  9.  Seizure prophylaxis.  Keppra 1000 mg twice daily 10.  Hypertension.  Elevated. Increase Norvasc to 10mg  daily on 6/9, HCTZ 12.5 mg daily.  orthostasis- con't regimen 11.  Hyperlipidemia.  Lipitor 12.  History of tobacco use.  Counseling 13. Hypokalemia: today, repeat tomorrow.  14. AKI: resolved. Placed nursing order to encourage 6-8 glasses of water per day, repeat Monday 15. Constipation  6/11- will give Sorbitol 60cc at 3pm after therapy- today and see if can get him to have BM's.  16. Vitamin D deficiency: start Ergocalciferol 50,000U weekly.  16. Disposition: HFU scheduled with Dr. on Monday. D/c 6/23   LOS: 8 days A FACE TO  FACE EVALUATION WAS PERFORMED  Carlis Abbott Gabrielle Wakeland 07/01/2020,

## 2020-07-01 NOTE — Progress Notes (Signed)
Occupational Therapy Weekly Progress Note  Patient Details  Name: Nathaniel Spears MRN: [id number] Date of Birth: [birth year]  Beginning of progress report period: June 24, 2020 End of progress report period: July 01, 2020  Today's Date: 07/01/2020 OT Individual Time: 0800-0900 OT Individual Time Calculation (min): 60 min    Patient has met 0 of 4 short term goals. Patient is making good progress in functional mobility at CGA-close spvsn and in routine ADLs with fewer cues required; however, pt continues to require max cues for safety awareness, sequencing, initiation, and motor planning during BADLs and is severely limited by expressive aphasia. Therefore at this time, not meeting STG levels of assistance and cueing assistance.  Patient continues to demonstrate the following deficits: muscle weakness, impaired timing and sequencing, motor apraxia, decreased coordination, and decreased motor planning, decreased visual perceptual skills and R visual field deficit, decreased attention to right, decreased motor planning, and ideational apraxia, decreased initiation, decreased awareness, decreased problem solving, decreased safety awareness, and decreased memory, and decreased standing balance and decreased balance strategies and therefore will continue to benefit from skilled OT intervention to enhance overall performance with BADL, iADL, and Reduce care partner burden.  Patient progressing toward long term goals..  Continue plan of care.  OT Short Term Goals Week 2:  OT Short Term Goal 1 (Week 2): Patient will complete functional transfers with close spvsn consistently. OT Short Term Goal 2 (Week 2): Patient will complete ADL task with min A and no more than min cues for initiation and sequencing. OT Short Term Goal 3 (Week 2): Patient will complete simple meal prep with close spvsn and mod cues. OT Short Term Goal 4 (Week 2): Patient will complete ADL task with no more than min cues to scan R task  environment. OT Short Term Goal 5 (Week 2): Patient will complete functional ambulation during ADL task with no more than 1-2 cues for safe DME use.  Skilled Therapeutic Interventions/Progress Updates:    Pt received sitting EOB with RN in room providing meds, agreeable to therapy, pleasant and calm affect. Yes/no responses consistent, but demonstrated difficulties throughout session with expressive aphasia and became frustrated. Pt reported sleeping well and eating good breakfast, agreeable to ADLs. OT gathered items for showering outside of room and upon return pt was found up walking in room with no AD after being asked to remain seated. Pt sat down upon request and given further instructions for next activity. Sit<>stand and functional ambulation close spvsn with RW; mod vc's for safe use of RW as pt frequently left walker in front of him as he went to turn another direction and ran into items with R side of walker. Pt gathered clothing items min A and mod multimodal cues for problem solving, initiation, and processing of 1 step commands. Pt agreed to toileting prior to shower and attempted to stand to urinate, but unable to void. Pt pulled pants and underwear up/down over hips with close spvsn. Pt encouraged to shower, but pt then declined, requesting only to change clothes. Seated EOB, pt doffed shirt with spvsn and vc's, but then required extra time and became frustrated as he tried to tell the OT what he wanted. Pt unable to express wants and became tearful, repetitively stating "[name]" (his SO). Emotional support and encouragement provided; pt expressed "you don't understand" regarding his aphasia and that Reino Bellis always understands. Pt encouraged by fact that d/c to home may be helpful because he has SO who does seem to understand his  wants/needs well. Pt completed grooming task applying deodorant with mod multimodal cues due to ideational and motor apraxia (included tactile cues as he first took  deodorant to his mouth and was unable to apply under R arm without HOHA). Pt demonstrated difficulties motor planning deficits when switching hands but was able to apply under L arm. Pt ambulated with RW ~500' in hallways for safe navigation and to provide change of scenery with close spvsn. Pt required mod vc's for safe navigation and was able to find room with one vc. Pt remained seated in recliner, alarm set, call bell in reach, and all needs met.   Therapy Documentation Precautions:  Precautions Precautions: Fall Precaution Comments: expressive and receptive aphasia Restrictions Weight Bearing Restrictions: No  Pain: Pain Assessment Pain Scale: 0-10 Pain Score: 0-No pain    Therapy/Group: Individual Therapy  Mellissa Kohut 07/01/2020, 10:45 AM

## 2020-07-01 NOTE — Progress Notes (Signed)
Physical Therapy Weekly Progress Note  Patient Details  Name: Nathaniel Spears MRN: [id number] Date of Birth: Jul 13, 1975  Beginning of progress report period: June 24, 2020 End of progress report period: July 01, 2020  Today's Date: 07/01/2020 PT Individual Time: 3748-2707 PT Individual Time Calculation (min): 25 min   Patient has met 4 of 4 short term goals. Pt demonstrates steady progress towards long term goals. Pt is currently able to perform bed mobility with supervision, transfers without AD and CGA/close supervision, ambulate >142f without AD and CGA, and navigate 12 steps with 2 rails and supervision. However, pt continues to require maximal cues for safety awareness, sequencing and motor planning for basic functional tasks, and is severely limited by expressive aphasia.   Patient continues to demonstrate the following deficits muscle weakness, impaired timing and sequencing, unbalanced muscle activation, motor apraxia, decreased coordination, and decreased motor planning, decreased motor planning, decreased awareness, decreased problem solving, decreased safety awareness, and delayed processing, and decreased standing balance, decreased postural control, and decreased balance strategies and therefore will continue to benefit from skilled PT intervention to increase functional independence with mobility.  Patient progressing toward long term goals..  Continue plan of care.  PT Short Term Goals Week 1:  PT Short Term Goal 1 (Week 1): Pt will complete bed mobility with supervision PT Short Term Goal 1 - Progress (Week 1): Met PT Short Term Goal 2 (Week 1): Pt will completed bed<>chair transfers with CGA consistently PT Short Term Goal 2 - Progress (Week 1): Met PT Short Term Goal 3 (Week 1): Pt will ambulate 1526fwith CGA and LRAD PT Short Term Goal 3 - Progress (Week 1): Met PT Short Term Goal 4 (Week 1): Pt will negotiated up/down 12 steps with CGA and LRAD PT Short Term Goal 4 -  Progress (Week 1): Met Week 2:  PT Short Term Goal 1 (Week 2): STG=LTG due to LOS  Skilled Therapeutic Interventions/Progress Updates:  Ambulation/gait training;Balance/vestibular training;Cognitive remediation/compensation;Community reintegration;Patient/family education;Stair training;UE/LE Coordination activities;UE/LE Strength taining/ROM;Splinting/orthotics;Pain management;DME/adaptive equipment instruction;Disease management/prevention;Neuromuscular re-education;Skin care/wound management;Therapeutic Exercise;Wheelchair propulsion/positioning;Therapeutic Activities;Visual/perceptual remediation/compensation;Functional mobility training;Psychosocial support;Discharge planning   Today's Interventions: Received pt sitting in bed. Pt appeared worried and able to communicate to therapist that he was concerned that he hadn't heard from Nathaniel Bellishis girlfriend) today. Reached out to Nathaniel Spears's contact information but Nathaniel Bellisalled moments after. Spoke with Nathaniel Bellisho stated she was feeling sick today but hoped to be back to visit pt tomorrow; explained this to pt and pt with increased frustration due to inability to communicate with her on the phone. Pt became tearful when trying to say "I love you" to [place] and [place]. Donned shoes with supervision and ambulated 10012f 2 trials without AD and close supervision through hallways. Emphasis on selective attention visual scanning to locate horseshoes placed along railings with max cues as pt frequently overshooting and walking right past them. Concluded session with pt sitting in recliner, needs within reach, and seatbelt alarm on.   Therapy Documentation Precautions:  Precautions Precautions: Fall Precaution Comments: expressive and receptive aphasia Restrictions Weight Bearing Restrictions: No  Therapy/Group: Individual Therapy AnnAlfonse Alpers, DPT   07/01/2020, 7:32 AM

## 2020-07-01 NOTE — Progress Notes (Signed)
Speech Language Pathology Daily Session Note  Patient Details  Name: Nathaniel Spears MRN: [id number] Date of Birth: 29-Aug-1975  Today's Date: 07/01/2020 SLP Individual Time: 8110-3159 SLP Individual Time Calculation (min): 55 min  Short Term Goals: Week 1: SLP Short Term Goal 1 (Week 1): Patient will follow 1-step directions related to functional environment with 50% accuracy and Mod A verbal and visual cues SLP Short Term Goal 2 (Week 1): Patient will identify objects/object pictures within field of three with 50% accuracy and Mod A verbal and visual cues SLP Short Term Goal 3 (Week 1): Patient will verbally name relevant objects/pictures/people with 50% accuracy and Max A verbal and visual cues  (phonemic, first letter, and sentence completion cues.) SLP Short Term Goal 4 (Week 1): Patient will utilize multimodal communication Retail banker, writing?) during 4/5 opportunities with Mod A verbal and visual cues  Skilled Therapeutic Interventions:   Patient seen for skilled ST session focusing on expressive and receptive aphasia. He was able to describe verb photos at word and short phrase (2-3 word) with 11/18 correct without cues and 16/18 correct with partial phrase or semantic cues. He pointed to word to match with object picture in field of three and was 75% accurate. He pointed to action pictures in field of three when action described and was approximately 60% accurate with modA cues. He had difficulty with pointing to names of foods, activities he liked as he would initially say "yeah" to every choic but then go back and seemed to try to indicate what he did not like. SLP to continue to work on Public affairs consultant that works best with patient. He continues to benefit from skilled SLP intervention to maximize cognitive-linguistic goals prior to discharge.  Pain Pain Assessment Pain Scale: Faces Faces Pain Scale: No hurt  Therapy/Group: Individual Therapy  Angela Nevin, MA,  CCC-SLP Speech Therapy

## 2020-07-02 LAB — BASIC METABOLIC PANEL
Anion gap: 8 (ref 5–15)
BUN: 11 mg/dL (ref 6–20)
CO2: 27 mmol/L (ref 22–32)
Calcium: 9.4 mg/dL (ref 8.9–10.3)
Chloride: 104 mmol/L (ref 98–111)
Creatinine, Ser: 1.25 mg/dL — ABNORMAL HIGH (ref 0.61–1.24)
GFR, Estimated: >60 mL/min (ref >60)
Glucose, Bld: 100 mg/dL — ABNORMAL HIGH (ref 70–99)
Potassium: 3.9 mmol/L (ref 3.5–5.1)
Sodium: 139 mmol/L (ref 135–145)

## 2020-07-02 LAB — GLUCOSE, CAPILLARY
Glucose-Capillary: 96 mg/dL (ref 70–99)
Glucose-Capillary: 83 mg/dL (ref 70–99)
Glucose-Capillary: 108 mg/dL — ABNORMAL HIGH (ref 70–99)
Glucose-Capillary: 95 mg/dL (ref 70–99)

## 2020-07-02 MED ORDER — LOPERAMIDE HCL 2 MG PO CAPS
2.0000 mg | ORAL_CAPSULE | ORAL | Status: DC | PRN
  Administered 2020-07-04: 2 mg via ORAL
  Filled 2020-07-02: qty 1

## 2020-07-02 NOTE — Progress Notes (Signed)
PROGRESS NOTE   Subjective/Complaints: Working with therapy He has no complaints Speech improving! RN notes diarrhea- imodium ordered (may be from newly started metformin)   ROS: limited due to aphasia, +2 loose stools as per RN   Objective:   No results found. No results for input(s): WBC, HGB, HCT, PLT in the last 72 hours.  Recent Labs    06/30/20 0647 07/02/20 0540  NA 138 139  K 3.9 3.9  CL 104 104  CO2 24 27  GLUCOSE 105* 100*  BUN 14 11  CREATININE 1.15 1.25*  CALCIUM 9.5 9.4    Intake/Output Summary (Last 24 hours) at 07/02/2020 1005 Last data filed at 07/02/2020 0300 Gross per 24 hour  Intake 118 ml  Output 200 ml  Net -82 ml        Physical Exam: Vital Signs Blood pressure 123/81, pulse 78, temperature 97.7 F (36.5 C), temperature source Oral, resp. rate 16, height 6\' 1"  (1.854 m), weight 104.7 kg, SpO2 97 %. Gen: no distress, normal appearing HEENT: oral mucosa pink and moist, NCAT Cardio: Reg rate Chest: normal effort, normal rate of breathing Abd: soft, non-distended   Ext: no clubbing, cyanosis, or edema Psych: pleasant and cooperative Skin: intact Neurologic: Cranial nerves II through XII intact, motor strength is 5/5 in bilateral deltoid, bicep, tricep, grip. Standing with good balance Aphasic- not consistent with Y/N, does seem to comprehend most basic information Sensory exam unable to assess due to aphasia    Musculoskeletal: Full range of motion in all 4 extremities. No joint swelling   Assessment/Plan: 1. Functional deficits which require 3+ hours per day of interdisciplinary therapy in a comprehensive inpatient rehab setting. Physiatrist is providing close team supervision and 24 hour management of active medical problems listed below. Physiatrist and rehab team continue to assess barriers to discharge/monitor patient progress toward functional and medical goals  Care  Tool:  Bathing    Body parts bathed by patient: Right arm, Left arm, Right upper leg, Right lower leg, Left upper leg, Face, Chest   Body parts bathed by helper: Right arm, Left arm, Front perineal area, Buttocks     Bathing assist Assist Level: Moderate Assistance - Patient 50 - 74%     Upper Body Dressing/Undressing Upper body dressing   What is the patient wearing?: Pull over shirt    Upper body assist Assist Level: Supervision/Verbal cueing    Lower Body Dressing/Undressing Lower body dressing      What is the patient wearing?: Underwear/pull up, Pants     Lower body assist Assist for lower body dressing: Moderate Assistance - Patient 50 - 74%     Toileting Toileting    Toileting assist Assist for toileting: Moderate Assistance - Patient 50 - 74%     Transfers Chair/bed transfer  Transfers assist     Chair/bed transfer assist level: Supervision/Verbal cueing     Locomotion Ambulation   Ambulation assist      Assist level: Contact Guard/Touching assist Assistive device: No Device Max distance: >174ft   Walk 10 feet activity   Assist     Assist level: Contact Guard/Touching assist Assistive device: Walker-rolling   Walk 50 feet  activity   Assist    Assist level: Contact Guard/Touching assist Assistive device: Walker-rolling    Walk 150 feet activity   Assist    Assist level: Contact Guard/Touching assist Assistive device: Walker-rolling    Walk 10 feet on uneven surface  activity   Assist     Assist level: Minimal Assistance - Patient > 75% Assistive device: Other (comment) (no Device)   Wheelchair     Assist Will patient use wheelchair at discharge?: No   Wheelchair activity did not occur: N/A         Wheelchair 50 feet with 2 turns activity    Assist    Wheelchair 50 feet with 2 turns activity did not occur: N/A       Wheelchair 150 feet activity     Assist  Wheelchair 150 feet activity did  not occur: N/A       Blood pressure 123/81, pulse 78, temperature 97.7 F (36.5 C), temperature source Oral, resp. rate 16, height 6\' 1"  (1.854 m), weight 104.7 kg, SpO2 97 %.    Medical Problem List and Plan: 1.  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  Impaired mobility -DVT/anticoagulation: D/c Lovenox since ambulating >500 feet             -antiplatelet therapy: Continue Aspirin 81 mg daily and Brilinta 90 mg twice daily x30 days then aspirin 325 mg daily and Plavix 75 mg daily for another 2 months and then Plavix alone 3. Pain Management: Continue Tylenol as needed 4. Mood: Continue Prozac 20 mg daily             -antipsychotic agents: N/A 5. Neuropsych: This patient is not capable of making decisions on his own behalf. 6. Skin/Wound Care: Routine skin checks 7. Fluids/Electrolytes/Nutrition:   Encourage po intake  8.  Diabetes mellitus.  Hemoglobin A1c 9.9.  Levemir 10 units daily.  Check blood sugars before meals and at bedtime.  Diabetic teaching  CBG (last 3)  Recent Labs    07/01/20 1646 07/01/20 2051 07/02/20 0611  GLUCAP 138* 112* 96    -6/17 well controlled- d/c metformin given #14 and continue to monitor 9.  Seizure prophylaxis.  Keppra 1000 mg twice daily 10.  Hypertension.  Elevated. Increase Norvasc to 10mg  daily on 6/9, HCTZ 12.5 mg daily.  orthostasis- con't regimen 11.  Hyperlipidemia.  Lipitor 12.  History of tobacco use.  Counseling 13. Hypokalemia: today, repeat tomorrow.  14. AKI: resolved. Placed nursing order to encourage 6-8 glasses of water per day, Cr increased 6/17 likely 2/2 metformin, d/c medication, repeat Cr Monday 15. Constipation: resolved 16. Vitamin D deficiency: start Ergocalciferol 50,000U weekly.  17. Diarrhea: likely 2/2 metformin. D/c and give imodium PRN  today. See #14.  16. Disposition: HFU scheduled with Dr. 7/17 on Sunday. D/c 6/23   LOS: 9 days A FACE TO FACE EVALUATION WAS PERFORMED  [id number] Sheridan Hew 07/02/2020,

## 2020-07-02 NOTE — Progress Notes (Signed)
Occupational Therapy Session Note  Patient Details  Name: Nathaniel Spears MRN: [id number] Date of Birth: 04-Apr-1975  Today's Date: 07/02/2020 OT Individual Time: 8341-9622 OT Individual Time Calculation (min): 28 min    Short Term Goals: Week 1:  OT Short Term Goal 1 (Week 1): patient will complete functional transfers and bed mobility with CS OT Short Term Goal 1 - Progress (Week 1): Not met OT Short Term Goal 2 (Week 1): patient will complete adl tasks/grooming/bathing with min A and min cues for initiation, sequencing OT Short Term Goal 2 - Progress (Week 1): Not met OT Short Term Goal 3 (Week 1): patient will complete basic meal prep 2-3 step task with CS and mod cues OT Short Term Goal 3 - Progress (Week 1): Not met OT Short Term Goal 4 (Week 1): patient will scan right side of environment with min cues OT Short Term Goal 4 - Progress (Week 1): Not met  Skilled Therapeutic Interventions/Progress Updates:    Pt received EOB with significant other sink side. Pt instructed in item search and retrieval with functional mobility in room with CGA. Pt instructed to collect towels all around room and place them on bed. Pt required mod cuing and time to find and pick up all towels. Pt then instructed to fold towels while standing EOB for B hand manipulation and coordination. After tx, pt instructed to sit EOB and begin eating lunch. Pt required min cuing to take lid oof plate in order to initiate self feeding task. After tx, pt left with SO present, sitting EOB with alarm on with all needs met.   Therapy Documentation Precautions:  Precautions Precautions: Fall Precaution Comments: expressive and receptive aphasia Restrictions Weight Bearing Restrictions: No  Vital Signs: Therapy Vitals Temp: 98.6 F (37 C) Temp Source: Oral Pulse Rate: 76 Resp: 17 BP: 115/74 Patient Position (if appropriate): Lying Oxygen Therapy SpO2: 100 % O2 Device: Room Air Pain: Pain Assessment Pain Score:  0-No pain    Therapy/Group: Individual Therapy  Tifanny Dollens 07/02/2020, 3:38 PM

## 2020-07-02 NOTE — Progress Notes (Signed)
Physical Therapy Session Note  Patient Details  Name: Toryn Mcclinton MRN: [id number] Date of Birth: [birth year]  Today's Date: 07/02/2020 PT Individual Time: 0830-0900 PT Individual Time Calculation (min): 30 min   Short Term Goals: Week 2:  PT Short Term Goal 1 (Week 2): STG=LTG due to LOS  Skilled Therapeutic Interventions/Progress Updates:    Patient in supine and wearing a gown.  Indicates he already went to bathroom.  Supine to sit with S and indicated wished to dress seated in chair.  Patient needing multimodal cues for putting on deodorant due to perseverating on rolling up the stick, then not initiating using it under armpits.  Patient donned shirt and pants with S.  Ambulated to bathroom with CGA.  Standing and needing mod cues and assist to get pants down since wearing briefs.  Patient at sink to wash hand needing cues for soap, towels and to turn water off.  Patient ambulated without device with CGA to stair well 70'.  Negotiated 10 steps with 2 rails and CGA pt slow and cautions to descend.  Patient ambulated 260' with CGA no device with min cues for obstacles on R.  Patient able to find room after stating he was in room 3.  Found sign to determine correct direction and needed assist to read numbers of rooms down the hallway and that his room, 06, covered in that range.  Patient left seated EOB with bed alarm on and call bell in reach and NT checking in on pt.   Therapy Documentation Precautions:  Precautions Precautions: Fall Precaution Comments: expressive and receptive aphasia Restrictions Weight Bearing Restrictions: No  Pain: Pain Assessment Pain Score: 0-No pain  Therapy/Group: Individual Therapy  Elray Mcgregor Sheran Lawless, PT 07/02/2020, 12:17 PM

## 2020-07-02 NOTE — Progress Notes (Signed)
Occupational Therapy Session Note  Patient Details  Name: Nathaniel Spears MRN: [id number] Date of Birth: Oct 26, 1975  Today's Date: 07/02/2020 OT Individual Time: 6789-3810 OT Individual Time Calculation (min): 57 min    Short Term Goals: Week 2:  OT Short Term Goal 1 (Week 2): Patient will complete functional transfers with close spvsn consistently. OT Short Term Goal 2 (Week 2): Patient will complete ADL task with min A and no more than min cues for initiation and sequencing. OT Short Term Goal 3 (Week 2): Patient will complete simple meal prep with close spvsn and mod cues. OT Short Term Goal 4 (Week 2): Patient will complete ADL task with no more than min cues to scan R task environment. OT Short Term Goal 5 (Week 2): Patient will complete functional ambulation during ADL task with no more than 1-2 cues for safe DME use.  Skilled Therapeutic Interventions/Progress Updates:    Treatment session with focus on functional mobility, safety awareness, R attention, and incorporation of RUE into tasks. Pt received semi-reclined in bed declining bathing this session.  Pt ambulated to therapy gym > 500' with supervision while pushing w/c.  Engaged in visual scanning and number matching activity in mirror with focus on R attention, initiation, and attention to task.  Pt required mod cues to scan to R as pt frequently with difficulty locating numbers in R visual field.  Therapist even occluding portion of mirror to allow for improved attention to R side.  Pt able to verbalize number sequence when counting, but demonstrating difficulty locating numbers when scanning for them.  Pt requiring visual cue intermittently.  Engaged in horse shoe toss activity in standing with focus on incorporation of RUE into task when reaching and then retrieving horse shoes from floor.  Pt with decreased release of objects when being held in R hand, requiring visual cues to release items.  Pt able to retrieve items from floor with  CGA fading to close supervision and no LOB.  Pt transported outside for change of scenery for a few minutes before return to room.  Pt able to verbalize more consistently when counting and some spontaneous 2-3 word statements.  Pt returned to room and left semi-reclined in bed with all needs in reach.  Therapy Documentation Precautions:  Precautions Precautions: Fall Precaution Comments: expressive and receptive aphasia Restrictions Weight Bearing Restrictions: No  Pain: Pain Assessment Pain Scale: 0-10 Pain Score: 0-No pain    Therapy/Group: Individual Therapy  Rosalio Loud 07/02/2020, 10:59 AM

## 2020-07-02 NOTE — Progress Notes (Signed)
Speech Language Pathology Weekly Progress and Session Note  Patient Details  Name: Nathaniel Spears MRN: [id number] Date of Birth: [birth year]  Beginning of progress report period: 06/24/2020 End of progress report period: 07/02/2020  Today's Date: 07/02/2020 SLP Individual Time:  -     Short Term Goals: Week 1: SLP Short Term Goal 1 (Week 1): Patient will follow 1-step directions related to functional environment with 50% accuracy and Mod A verbal and visual cues SLP Short Term Goal 1 - Progress (Week 1): Met SLP Short Term Goal 2 (Week 1): Patient will identify objects/object pictures within field of three with 50% accuracy and Mod A verbal and visual cues SLP Short Term Goal 2 - Progress (Week 1): Met SLP Short Term Goal 3 (Week 1): Patient will verbally name relevant objects/pictures/people with 50% accuracy and Max A verbal and visual cues  (phonemic, first letter, and sentence completion cues.) SLP Short Term Goal 3 - Progress (Week 1): Met SLP Short Term Goal 4 (Week 1): Patient will utilize multimodal communication Passenger transport manager, writing?) during 4/5 opportunities with Mod A verbal and visual cues SLP Short Term Goal 4 - Progress (Week 1): Progressing toward goal    New Short Term Goals: Week 2: SLP Short Term Goal 1 (Week 2): STG=LTG  Weekly Progress Updates:  Patient has made good progress and met 4/5 STG's and continuing to work on goal of using Horticulturist, commercial. Patient appears motivated to work and seems to work to best of his ability during speech therapy sessions. He demonstrates adequate awareness to naming errors and although he is able to self-cue, majority of the time he requires SLP cues to fully express himself.  At times, frustration impacts his ability to progress.    Intensity: Minumum of 1-2 x/day, 30 to 90 minutes Frequency: 3 to 5 out of 7 days Duration/Length of Stay: 6/23 Treatment/Interventions: Internal/external aids;Speech/Language  facilitation;Cueing hierarchy;Functional tasks;Patient/family education   Sonia Baller, MA, CCC-SLP Speech Therapy

## 2020-07-02 NOTE — Progress Notes (Signed)
Physical Therapy Session Note  Patient Details  Name: Nathaniel Spears MRN: [id number] Date of Birth: 1975/06/09  Today's Date: 07/02/2020 PT Individual Time: 9480-1655 PT Individual Time Calculation (min): 54 min   Short Term Goals: Week 1:  PT Short Term Goal 1 (Week 1): Pt will complete bed mobility with supervision PT Short Term Goal 1 - Progress (Week 1): Met PT Short Term Goal 2 (Week 1): Pt will completed bed<>chair transfers with CGA consistently PT Short Term Goal 2 - Progress (Week 1): Met PT Short Term Goal 3 (Week 1): Pt will ambulate 154f with CGA and LRAD PT Short Term Goal 3 - Progress (Week 1): Met PT Short Term Goal 4 (Week 1): Pt will negotiated up/down 12 steps with CGA and LRAD PT Short Term Goal 4 - Progress (Week 1): Met Week 2:  PT Short Term Goal 1 (Week 2): STG=LTG due to LOS  Skilled Therapeutic Interventions/Progress Updates:   Received pt slouched down in bed with lunch tray sitting in front of him untouched. Pt agreeable to PT treatment and denied any pain during session. Asked pt if he wanted to try eating some of his lunch and pt stated "no". However, when therapist set up fruit cup in front of pt, handed him a fork, and cued activity, pt began to eat. Pt transferred semi-reclined<>sitting EOB mod I and placed lunch tray in front of him. Worked on dynamic sitting balance and sustained attention while eating and with mod cues and encouragement, pt able to initiate eating bites of applesauce, fruit, and chicken salad sandwich but frequently "freezing" and requiring cues to continue meal. Pt perseverating on saying "um" and when asked if he needed to toilet pt responded "yes". Ambulated to toilet with close supervision and pt able to stand and void with CGA and mod/max cues and assist for clothing management and to aim to avoid urinating on floor. Ambulated to sink and stood and washed hands with supervision and cues for sequencing. Pt able to turn water on and locate  paper towels but unable to locate or problem solve how to use soap dispenser. Returned to recliner to continue eating then expressed that he wanted to continue eating after therapy session. Girlfriend, [name] arrived during session and pt and HReino Bellisordered meals together for the next few days. Donned shoes with supervision and pt ambulated 1560fx 2 and 7594f 2 trials without AD and close supervision to/from 38M ortho gym; cues to increase gait speed. Pt with difficulty motor planning which buttons to press on elevator and when cued pt to press down button; pt continued to press up button. Worked on dynamic standing balance sorting chips by colors (red, blue, white) and placing them into cup with emphasis on following directions, visual scanning, motor planning, sequencing, and selective attention. Pt required mod/max cues fading to min by end of activity demonstrating improved carry over and understanding of activity. Pt expressed desire to return to bed at end of session and concluded session with pt sitting EOB eating lunch, needs within reach, and bed alarm on. [name] present at bedside.   Therapy Documentation Precautions:  Precautions Precautions: Fall Precaution Comments: expressive and receptive aphasia Restrictions Weight Bearing Restrictions: No  Therapy/Group: Individual Therapy AnnAlfonse Alpers, DPT   07/02/2020, 7:17 AM

## 2020-07-03 LAB — GLUCOSE, CAPILLARY
Glucose-Capillary: 95 mg/dL (ref 70–99)
Glucose-Capillary: 95 mg/dL (ref 70–99)
Glucose-Capillary: 115 mg/dL — ABNORMAL HIGH (ref 70–99)
Glucose-Capillary: 108 mg/dL — ABNORMAL HIGH (ref 70–99)

## 2020-07-03 NOTE — Progress Notes (Signed)
Physical Therapy Session Note  Patient Details  Name: Nathaniel Spears MRN: [id number] Date of Birth: December 09, 1975  Today's Date: 07/03/2020 PT Individual Time: 1359-1455 PT Individual Time Calculation (min): 56 min   Short Term Goals: Week 1:  PT Short Term Goal 1 (Week 1): Pt will complete bed mobility with supervision PT Short Term Goal 1 - Progress (Week 1): Met PT Short Term Goal 2 (Week 1): Pt will completed bed<>chair transfers with CGA consistently PT Short Term Goal 2 - Progress (Week 1): Met PT Short Term Goal 3 (Week 1): Pt will ambulate 138f with CGA and LRAD PT Short Term Goal 3 - Progress (Week 1): Met PT Short Term Goal 4 (Week 1): Pt will negotiated up/down 12 steps with CGA and LRAD PT Short Term Goal 4 - Progress (Week 1): Met Week 2:  PT Short Term Goal 1 (Week 2): STG=LTG due to LOS  Skilled Therapeutic Interventions/Progress Updates:   Received pt sitting in recliner with girlfriend, [name] present at bedside. Pt agreeable to PT treatment and denied any pain during session. Session with emphasis on functional mobility, generalized strengthening, dynamic standing balance/coordination, motor control/sequencing, attention, word finding, and improved activity tolerance. Donned shoes with supervision and ambulated 1579fx 2 and >30084f 2 trials without AD and close supervision to/from 20M ortho gym with 1 standing rest break on elevator. Pt required min cues for direction and turning L vs R as well as to increase cadence to promote arm swing (demonstrating improvements from previous sessions). Worked on dynamic sitting balance and sustained attention while copying simple pattern on pegboard (alternating red/blue in horizontal line) x 1 trial starting with min verbal cues increasing halfway through to mod/max cues due to pt "freezing". Pt required visual cues to scan to R and to place peg inside correct hole at correct angle (as pt attempting to put 3 pieces in upside down), tactile  cues to touch correct colored pieces, and verbal cues to select appropriate color. Then placed 4 different colored cones in front of pt. Pt able to correctly state color for 2/4 colored cones but then began perseverating on "purple" when therapist pointed to blue and yellow cones potentially due to difficulty with word finding. Required max cues for correction and had pt repeat correct colors after therapist to emphasize repetition. Transitioned to therapist saying colors and having pt point to colored cones; pt correct 8/10 trials requring max cues for 2 missed trials. Then pointed to cones again and had pt name colors; pt correct on 1/4 but able to get ramining colors with therapist sounding out few letters of color and pt getting remainder of word. Pt required rest breaks throughout session due to frustration with expressive aphasia. Concluded session with pt sidelying in bed, needs within reach, and bed alarm on with 4 rails up per Nathaniel Spears's request. Provided Nathaniel Spears with colored strips of paper to continue working on word finding with pt.   Therapy Documentation Precautions:  Precautions Precautions: Fall Precaution Comments: expressive and receptive aphasia Restrictions Weight Bearing Restrictions: No  Therapy/Group: Individual Therapy AnnAlfonse Alpers, DPT   07/03/2020, 7:28 AM

## 2020-07-03 NOTE — Progress Notes (Signed)
PROGRESS NOTE   Subjective/Complaints: Remains aphaic "not hungry" no ab pain  Bowels with mixed cont/incont  ROS: limited due to aphasia,    Objective:   No results found. No results for input(s): WBC, HGB, HCT, PLT in the last 72 hours.  Recent Labs    07/02/20 0540  NA 139  K 3.9  CL 104  CO2 27  GLUCOSE 100*  BUN 11  CREATININE 1.25*  CALCIUM 9.4    No intake or output data in the 24 hours ending 07/03/20 0956       Physical Exam: Vital Signs Blood pressure 138/87, pulse 91, temperature 98.1 F (36.7 C), resp. rate 17, height 6\' 1"  (1.854 m), weight 104.7 kg, SpO2 97 %.  General: No acute distress Mood and affect are appropriate Heart: Regular rate and rhythm no rubs murmurs or extra sounds Lungs: Clear to auscultation, breathing unlabored, no rales or wheezes Abdomen: Positive bowel sounds, soft nontender to palpation, nondistended Extremities: No clubbing, cyanosis, or edema Skin: No evidence of breakdown, no evidence of rash  Skin: intact Neurologic: Cranial nerves II through XII intact, motor strength is 5/5 in bilateral deltoid, bicep, tricep, grip. Standing with good balance Aphasic- not consistent with Y/N, does seem to comprehend most basic information Sensory exam unable to assess due to aphasia    Musculoskeletal: Full range of motion in all 4 extremities. No joint swelling   Assessment/Plan: 1. Functional deficits which require 3+ hours per day of interdisciplinary therapy in a comprehensive inpatient rehab setting. Physiatrist is providing close team supervision and 24 hour management of active medical problems listed below. Physiatrist and rehab team continue to assess barriers to discharge/monitor patient progress toward functional and medical goals  Care Tool:  Bathing    Body parts bathed by patient: Right arm, Left arm, Right upper leg, Right lower leg, Left upper leg, Face,  Chest   Body parts bathed by helper: Right arm, Left arm, Front perineal area, Buttocks     Bathing assist Assist Level: Moderate Assistance - Patient 50 - 74%     Upper Body Dressing/Undressing Upper body dressing   What is the patient wearing?: Pull over shirt    Upper body assist Assist Level: Supervision/Verbal cueing    Lower Body Dressing/Undressing Lower body dressing      What is the patient wearing?: Underwear/pull up, Pants     Lower body assist Assist for lower body dressing: Moderate Assistance - Patient 50 - 74%     Toileting Toileting    Toileting assist Assist for toileting: Moderate Assistance - Patient 50 - 74%     Transfers Chair/bed transfer  Transfers assist     Chair/bed transfer assist level: Supervision/Verbal cueing     Locomotion Ambulation   Ambulation assist      Assist level: Supervision/Verbal cueing Assistive device: No Device Max distance: 168ft   Walk 10 feet activity   Assist     Assist level: Supervision/Verbal cueing Assistive device: No Device   Walk 50 feet activity   Assist    Assist level: Supervision/Verbal cueing Assistive device: No Device    Walk 150 feet activity   Assist  Assist level: Supervision/Verbal cueing Assistive device: No Device    Walk 10 feet on uneven surface  activity   Assist     Assist level: Minimal Assistance - Patient > 75% Assistive device: Other (comment) (no Device)   Wheelchair     Assist Will patient use wheelchair at discharge?: No   Wheelchair activity did not occur: N/A         Wheelchair 50 feet with 2 turns activity    Assist    Wheelchair 50 feet with 2 turns activity did not occur: N/A       Wheelchair 150 feet activity     Assist  Wheelchair 150 feet activity did not occur: N/A       Blood pressure 138/87, pulse 91, temperature 98.1 F (36.7 C), resp. rate 17, height 6\' 1"  (1.854 m), weight 104.7 kg, SpO2 97 %.     Medical Problem List and Plan: 1.  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  Impaired mobility -DVT/anticoagulation: D/c Lovenox since ambulating >500 feet             -antiplatelet therapy: Continue Aspirin 81 mg daily and Brilinta 90 mg twice daily x30 days then aspirin 325 mg daily and Plavix 75 mg daily for another 2 months and then Plavix alone 3. Pain Management: Continue Tylenol as needed 4. Mood: Continue Prozac 20 mg daily             -antipsychotic agents: N/A 5. Neuropsych: This patient is not capable of making decisions on his own behalf. 6. Skin/Wound Care: Routine skin checks 7. Fluids/Electrolytes/Nutrition:   Encourage po intake  8.  Diabetes mellitus.  Hemoglobin A1c 9.9.  Levemir 10 units daily.  Check blood sugars before meals and at bedtime.  Diabetic teaching  CBG (last 3)  Recent Labs    07/02/20 1651 07/02/20 2112 07/03/20 0531  GLUCAP 108* 95 95     -6/18 normal 9.  Seizure prophylaxis.  Keppra 1000 mg twice daily 10.  Hypertension.  Elevated. Increase Norvasc to 10mg  daily on 6/9, HCTZ 12.5 mg daily.  orthostasis- con't regimen 11.  Hyperlipidemia.  Lipitor 12.  History of tobacco use.  Counseling 13. Hypokalemia: today, repeat tomorrow.  14. AKI: resolved. Placed nursing order to encourage 6-8 glasses of water per day, Cr increased 6/17 likely 2/2 metformin, d/c medication, repeat Cr Monday 15. Constipation: resolved 16. Vitamin D deficiency: start Ergocalciferol 50,000U weekly.  17. Diarrhea: likely 2/2 metformin. D/c and give imodium PRN today. improving 16. Disposition: HFU scheduled with Dr. 7/17 on Saturday. D/c 6/23   LOS: 10 days A FACE TO FACE EVALUATION WAS PERFORMED  [id number] 07/03/2020,

## 2020-07-04 LAB — GLUCOSE, CAPILLARY
Glucose-Capillary: 108 mg/dL — ABNORMAL HIGH (ref 70–99)
Glucose-Capillary: 103 mg/dL — ABNORMAL HIGH (ref 70–99)
Glucose-Capillary: 98 mg/dL (ref 70–99)
Glucose-Capillary: 91 mg/dL (ref 70–99)

## 2020-07-05 LAB — BASIC METABOLIC PANEL
Anion gap: 8 (ref 5–15)
BUN: 10 mg/dL (ref 6–20)
CO2: 29 mmol/L (ref 22–32)
Calcium: 9.5 mg/dL (ref 8.9–10.3)
Chloride: 102 mmol/L (ref 98–111)
Creatinine, Ser: 1.2 mg/dL (ref 0.61–1.24)
GFR, Estimated: >60 mL/min (ref >60)
Glucose, Bld: 108 mg/dL — ABNORMAL HIGH (ref 70–99)
Potassium: 3.8 mmol/L (ref 3.5–5.1)
Sodium: 139 mmol/L (ref 135–145)

## 2020-07-05 LAB — GLUCOSE, CAPILLARY: Glucose-Capillary: 102 mg/dL — ABNORMAL HIGH (ref 70–99)

## 2020-07-05 MED ORDER — POTASSIUM CHLORIDE 20 MEQ PO PACK
20.0000 meq | PACK | Freq: Once | ORAL | Status: AC
  Administered 2020-07-05: 20 meq via ORAL
  Filled 2020-07-05: qty 1

## 2020-07-05 MED ORDER — METOPROLOL SUCCINATE ER 25 MG PO TB24
12.5000 mg | ORAL_TABLET | Freq: Every day | ORAL | Status: DC
  Administered 2020-07-05 – 2020-07-08 (×4): 12.5 mg via ORAL
  Filled 2020-07-05 (×4): qty 1

## 2020-07-05 NOTE — Progress Notes (Signed)
Speech Language Pathology Daily Session Note  Patient Details  Name: Nathaniel Spears MRN: [id number] Date of Birth: 05/23/1975  Today's Date: 07/05/2020 SLP Individual Time: 1015-1100 SLP Individual Time Calculation (min): 45 min  Short Term Goals: Week 2: SLP Short Term Goal 1 (Week 2): STG=LTG  Skilled Therapeutic Interventions: Skilled SLP treatment performed with focus on communication goals. Agreeable to participate in ST treatment despite sadness today, as he had just learned that his partner's mother passed away. SLP facilitated phrase completion task with Min A verbal cues and 90% accuracy when the full phrase was provided by therapist. Achieved 57% accuracy when given written cues of the phrase, progressing to 92% accuracy with min verbal cues (phonemic). Verbal reading was approximately 30% accurate at phrase level and ~50% accurate with at the single word level (mainly verbs and nouns). Increased perseveration noted during reading task. He exhibited increased difficulty producing prepositions and subjects (in the, on the, with a). SLP facilitated writing with dry erase board. Patient was able to write his first name with 50% accuracy progressing to 100% during 2/2 occasions. Unable to write his last name despite several attempts to trace and copy pre-written name. Required hand-over-hand to trace letters. Patient was left in his bed with alarm activated and all needs within reach. Continue with current plan of care.    Pain Pain Assessment Pain Scale: 0-10 Pain Score: 0-No pain  Therapy/Group: Individual Therapy  Tamala Ser 07/05/2020, 10:58 AM

## 2020-07-05 NOTE — Progress Notes (Signed)
Occupational Therapy Session Note  Patient Details  Name: Nathaniel Spears MRN: [id number] Date of Birth: 12/01/1975  Today's Date: 07/05/2020 OT Individual Time: 3887-1959 OT Individual Time Calculation (min): 51 min    Short Term Goals: Week 2:  OT Short Term Goal 1 (Week 2): Patient will complete functional transfers with close spvsn consistently. OT Short Term Goal 2 (Week 2): Patient will complete ADL task with min A and no more than min cues for initiation and sequencing. OT Short Term Goal 3 (Week 2): Patient will complete simple meal prep with close spvsn and mod cues. OT Short Term Goal 4 (Week 2): Patient will complete ADL task with no more than min cues to scan R task environment. OT Short Term Goal 5 (Week 2): Patient will complete functional ambulation during ADL task with no more than 1-2 cues for safe DME use.  Skilled Therapeutic Interventions/Progress Updates:    Pt received seated upright in bed, agreeable to OT session, reporting no pain. Session focus on BADLs. Scooting to EOB mod I with bed rails. Sit<>stand and ambulation with close spvsn and intermittent CGA due to unsteadiness on feet and not always using RW safely in tight turns with obstacles. Pt ambulated with RW to retrieve clothing for day; required 1-2 step commands to retrieve all necessary items. Pt maintained dynamic standing balance while reaching outside BOS but required vc's for safety as he stood far away from closet while trying to reach items. [name] shirt on hanger got stuck and pt became frustrated trying to take off and suddenly attempted communicating urgent need for urination. When asked if he needed to use the bathroom, he quickly said yes. Pt ambulated with RW to bathroom and stood to complete urination with close spvsn. Pt then ambulated to shower without AD requiring mod vc's and CGA for safe stepping into walk-in shower. Pt unable to follow demonstration of side stepping into shower while holding onto  wall. UB dressing set up A. LB dressing close spvsn and min vc's to correct error. Pt showered with close spvsn-CGA when in stance, requiring multimodal cues (did best with 1 step commands/intermittent tactile cue) to wash R side of body and to remain seated on shower seat; pt perseverating on washing LLE. Pt sat in recliner following shower and received phone call from significant other who told him that her mother passed away today; pt became emotional and SO informed OT of news since pt "might be emotional and not be able to communicate what is wrong". Pt ambulated to closet and successfully retrieved shirt from hanger with min vc's for problem solving. Pt engaged in oral hygiene standing at sink with RW requiring questioning cues for recall of need of toothpaste and cup of water. Pt provided with time for grieving and rest due to recent news and emotional state. Pt remained seated in recliner with seat alarm active, call bell in reach, all needs met.  Therapy Documentation Precautions:  Precautions Precautions: Fall Precaution Comments: expressive and receptive aphasia Restrictions Weight Bearing Restrictions: No General: General OT Amount of Missed Time: 9 Minutes  Pain: Pain Assessment Pain Scale: 0-10 Pain Score: 0-No pain   Therapy/Group: Individual Therapy  Mellissa Kohut 07/05/2020, 12:45 PM

## 2020-07-05 NOTE — Progress Notes (Signed)
Physical Therapy Session Note  Patient Details  Name: Nathaniel Spears MRN: [id number] Date of Birth: [birth year]  Today's Date: 07/05/2020 PT Individual Time: 1305-1400 PT Individual Time Calculation (min): 55 min   Short Term Goals: Week 2:  PT Short Term Goal 1 (Week 2): STG=LTG due to LOS  Skilled Therapeutic Interventions/Progress Updates:    Patient in supine with meal tray still in front partially eaten and mother and father in the room.  States finished eating and performed supine to sit and donned shoes with S.  Focus of session on gait mechanics, R pelvic protraction and coordination/balance.  Patient ambulated x 180' to 4 M ortho gym able to negotiate on elevator with cues and S.  Performed Nu Step x 5 min UE/LE @ level 5.  Patient supine on mat for lateral trunk roll stretch 2 x 20 sec with mod cues and facilitation.  Patient seated for reciprocal scooting with mod cues and assist.  Ambulated to general gym with mod facilitation for R hemipelvic protraction.  Performed crossing over in front at wall rail with mod/max cues & facilitation with demonstration for technique/sequencing.  Step taps to cones of two colors pt able to state colors with initial syllable cue for only 1 of the colors needed.  Step taps to cones, but pt unable to sequence to correct color and difficulty lifting foot high enough so switched to colored discs on 6" step.  Able to tap discs, but not accurately to color and drifting L throughout task so that not even able to reach colored disc on R so cue needed to step laterally several times during task with HHA.  Patient in parallel bars stepping over cones forward with mod cues for sequence to initiate with R for increased stride length and pelvic protraction.  Patient negotiated 4 steps x 4 to total 16 with rails and CGA to close S completing step through technique with heavy UE reliance.  Patient attempted tandem gait with UE support and max cues and demonstration.  Patient  ambulated to room with min facilitation for R pelvic protraction x 180'.  Seated EOB with family in the room and NT in for vital check.  Therapy Documentation Precautions:  Precautions Precautions: Fall Precaution Comments: expressive and receptive aphasia Restrictions Weight Bearing Restrictions: No  Pain: Pain Assessment Pain Scale: 0-10 Pain Score: 0-No pain    Therapy/Group: Individual Therapy  [name], PT 07/05/2020, 1:14 PM

## 2020-07-05 NOTE — Progress Notes (Signed)
Occupational Therapy Session Note  Patient Details  Name: Nathaniel Spears MRN: [id number] Date of Birth: 07-Jul-1975  Today's Date: 07/05/2020 OT Individual Time: 1430-1500 OT Individual Time Calculation (min): 30 min    Short Term Goals: Week 1:  OT Short Term Goal 1 (Week 1): patient will complete functional transfers and bed mobility with CS OT Short Term Goal 1 - Progress (Week 1): Not met OT Short Term Goal 2 (Week 1): patient will complete adl tasks/grooming/bathing with min A and min cues for initiation, sequencing OT Short Term Goal 2 - Progress (Week 1): Not met OT Short Term Goal 3 (Week 1): patient will complete basic meal prep 2-3 step task with CS and mod cues OT Short Term Goal 3 - Progress (Week 1): Not met OT Short Term Goal 4 (Week 1): patient will scan right side of environment with min cues OT Short Term Goal 4 - Progress (Week 1): Not met Week 2:  OT Short Term Goal 1 (Week 2): Patient will complete functional transfers with close spvsn consistently. OT Short Term Goal 2 (Week 2): Patient will complete ADL task with min A and no more than min cues for initiation and sequencing. OT Short Term Goal 3 (Week 2): Patient will complete simple meal prep with close spvsn and mod cues. OT Short Term Goal 4 (Week 2): Patient will complete ADL task with no more than min cues to scan R task environment. OT Short Term Goal 5 (Week 2): Patient will complete functional ambulation during ADL task with no more than 1-2 cues for safe DME use.  Skilled Therapeutic Interventions/Progress Updates:    Pt received in bed and consented to OT tx. Pt seen for [facility] activity to increase xecutive function and [facility] for ADLs. Pt instructed in colored tacks and cork board activity, instructed to place tacks in matching cork board spots. Pt req max cuing to place tacks in correct spots, frequently mismatches colors and appears frustrated. Pt also noted to only place tacks on L side of sheet, indicating R side  neglect/inattention. As pt appeared more frustrated, therapist changed task to sorting silverware into correct piles. Pt had less trouble with this task, however continues to require max cuing for initiation of task. All activities completed while standing at tray table to increase standing balance for ADLs. After tx, pt helped back to bed and left with all needs met, bed alarm on.   Therapy Documentation Precautions:  Precautions Precautions: Fall Precaution Comments: expressive and receptive aphasia Restrictions Weight Bearing Restrictions: No General: General OT Amount of Missed Time: 9 Minutes Vital Signs: Therapy Vitals Temp: 98.2 F (36.8 C) Pulse Rate: (!) 105 Resp: 20 BP: 117/76 Patient Position (if appropriate): Sitting Oxygen Therapy SpO2: 100 % O2 Device: Room Air Pain: Pain Assessment Pain Scale: 0-10 Pain Score: 0-No pain   Therapy/Group: Individual Therapy  Aidyn Kellis 07/05/2020, 2:40 PM

## 2020-07-05 NOTE — Progress Notes (Signed)
PROGRESS NOTE   Subjective/Complaints: No complaints this morning  ROS: Limited due to aphasia,    Objective:   No results found. No results for input(s): WBC, HGB, HCT, PLT in the last 72 hours.  Recent Labs    07/05/20 0919  NA 139  K 3.8  CL 102  CO2 29  GLUCOSE 108*  BUN 10  CREATININE 1.20  CALCIUM 9.5    Intake/Output Summary (Last 24 hours) at 07/05/2020 1442 Last data filed at 07/05/2020 0720 Gross per 24 hour  Intake 413 ml  Output --  Net 413 ml        Physical Exam: Vital Signs Blood pressure 117/76, pulse (!) 105, temperature 98.2 F (36.8 C), resp. rate 20, height 6\' 1"  (1.854 m), weight 104.7 kg, SpO2 100 %. Gen: no distress, normal appearing HEENT: oral mucosa pink and moist, NCAT Cardio: Tachycardia Chest: normal effort, normal rate of breathing Abd: soft, non-distended Ext: no edema Psych: pleasant, normal affect   Skin: intact Neurologic: Cranial nerves II through XII intact, motor strength is 5/5 in bilateral deltoid, bicep, tricep, grip. Standing with good balance Aphasic- not consistent with Y/N, does seem to comprehend most basic information Sensory exam unable to assess due to aphasia    Musculoskeletal: Full range of motion in all 4 extremities. No joint swelling   Assessment/Plan: 1. Functional deficits which require 3+ hours per day of interdisciplinary therapy in a comprehensive inpatient rehab setting. Physiatrist is providing close team supervision and 24 hour management of active medical problems listed below. Physiatrist and rehab team continue to assess barriers to discharge/monitor patient progress toward functional and medical goals  Care Tool:  Bathing    Body parts bathed by patient: Right arm, Left arm, Right upper leg, Right lower leg, Left upper leg, Face, Chest, Front perineal area, Buttocks, Abdomen, Left lower leg   Body parts bathed by helper: Right  arm, Left arm, Front perineal area, Buttocks     Bathing assist Assist Level: Contact Guard/Touching assist     Upper Body Dressing/Undressing Upper body dressing   What is the patient wearing?: Pull over shirt    Upper body assist Assist Level: Supervision/Verbal cueing    Lower Body Dressing/Undressing Lower body dressing      What is the patient wearing?: Underwear/pull up, Pants     Lower body assist Assist for lower body dressing: Contact Guard/Touching assist     Toileting Toileting    Toileting assist Assist for toileting: Contact Guard/Touching assist     Transfers Chair/bed transfer  Transfers assist     Chair/bed transfer assist level: Supervision/Verbal cueing     Locomotion Ambulation   Ambulation assist      Assist level: Supervision/Verbal cueing Assistive device: No Device Max distance: 114ft   Walk 10 feet activity   Assist     Assist level: Supervision/Verbal cueing Assistive device: No Device   Walk 50 feet activity   Assist    Assist level: Supervision/Verbal cueing Assistive device: No Device    Walk 150 feet activity   Assist    Assist level: Supervision/Verbal cueing Assistive device: No Device    Walk 10 feet on uneven  surface  activity   Assist     Assist level: Minimal Assistance - Patient > 75% Assistive device: Other (comment) (no Device)   Wheelchair     Assist Will patient use wheelchair at discharge?: No   Wheelchair activity did not occur: N/A         Wheelchair 50 feet with 2 turns activity    Assist    Wheelchair 50 feet with 2 turns activity did not occur: N/A       Wheelchair 150 feet activity     Assist  Wheelchair 150 feet activity did not occur: N/A       Blood pressure 117/76, pulse (!) 105, temperature 98.2 F (36.8 C), resp. rate 20, height 6\' 1"  (1.854 m), weight 104.7 kg, SpO2 100 %.    Medical Problem List and Plan: 1.  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  Impaired mobility -DVT/anticoagulation: D/c Lovenox since ambulating >500 feet             -antiplatelet therapy: Continue Aspirin 81 mg daily and Brilinta 90 mg twice daily x30 days then aspirin 325 mg daily and Plavix 75 mg daily for another 2 months and then Plavix alone 3. Pain Management: Continue Tylenol as needed 4. Mood: Continue Prozac 20 mg daily             -antipsychotic agents: N/A 5. Neuropsych: This patient is not capable of making decisions on his own behalf. 6. Skin/Wound Care: Routine skin checks 7. Fluids/Electrolytes/Nutrition:   Encourage po intake  8.  Diabetes mellitus.  Hemoglobin A1c 9.9.  Levemir 10 units daily.  Check blood sugars before meals and at bedtime.  Diabetic teaching  CBG (last 3)  Recent Labs    07/04/20 1616 07/04/20 2056 07/05/20 0610  GLUCAP 98 91 102*    -6/20: stable: d/c ISS.  9.  Seizure prophylaxis.  Completed course of Keppra 1000 mg twice daily, discontinue 10.  Hypertension.  Elevated. Increase Norvasc to 10mg  daily on 6/9, HCTZ 12.5 mg daily.  orthostasis- con't regimen 11.  Hyperlipidemia.  Lipitor 12.  History of tobacco use.  Counseling 13. Hypokalemia: today, repeat tomorrow.  14. AKI: normal on 6/20. Placed nursing order to encourage 6-8 glasses of water per day, Cr increased 6/17 likely 2/2 metformin, d/c medication, repeat Cr Monday 15. Constipation: resolved 16. Vitamin D deficiency: start Ergocalciferol 50,000U weekly.  17. Diarrhea: likely 2/2 metformin. D/c and give imodium PRN today. Improving 18. Tachycardia: toprol-XL 12.5mg  started.  16. Disposition: HFU scheduled with Dr. 7/17 on Wednesday. D/c 6/23   LOS: 12 days A FACE TO FACE EVALUATION WAS PERFORMED  [id number] Nathaniel Spears 07/05/2020,

## 2020-07-06 LAB — BASIC METABOLIC PANEL
Anion gap: 15 (ref 5–15)
BUN: 14 mg/dL (ref 6–20)
CO2: 23 mmol/L (ref 22–32)
Calcium: 9.5 mg/dL (ref 8.9–10.3)
Chloride: 102 mmol/L (ref 98–111)
Creatinine, Ser: 1.28 mg/dL — ABNORMAL HIGH (ref 0.61–1.24)
GFR, Estimated: >60 mL/min (ref >60)
Glucose, Bld: 90 mg/dL (ref 70–99)
Potassium: 3.4 mmol/L — ABNORMAL LOW (ref 3.5–5.1)
Sodium: 140 mmol/L (ref 135–145)

## 2020-07-06 LAB — GLUCOSE, CAPILLARY
Glucose-Capillary: 106 mg/dL — ABNORMAL HIGH (ref 70–99)
Glucose-Capillary: 153 mg/dL — ABNORMAL HIGH (ref 70–99)
Glucose-Capillary: 113 mg/dL — ABNORMAL HIGH (ref 70–99)

## 2020-07-06 MED ORDER — POTASSIUM CHLORIDE 20 MEQ PO PACK
40.0000 meq | PACK | Freq: Two times a day (BID) | ORAL | Status: AC
  Administered 2020-07-06 – 2020-07-07 (×2): 40 meq via ORAL
  Filled 2020-07-06 (×2): qty 2

## 2020-07-06 NOTE — Progress Notes (Signed)
Speech Language Pathology Daily Session Note  Patient Details  Name: Nathaniel Spears MRN: [id number] Date of Birth: [birth year]  Today's Date: 07/06/2020 SLP Individual Time: 1450-1530 SLP Individual Time Calculation (min): 40 min  Short Term Goals: Week 2: SLP Short Term Goal 1 (Week 2): STG=LTG  Skilled Therapeutic Interventions: Pt seen for skilled ST with focus on expressive aphasia, significant other present throughout. Discussing upcoming d/c date of 6/23 as well as recommendations for continued ST once discharged from CIR. SLP facilitating language task by providing mod A verbal cues for accurate phrase/sentence completion. Pt with very minimal episodes of perseveration during task. Pt counting 1-10 100% accurate, requiring max cues to state days of week, often repeating numbers from previous exercise. Pt singing Happy Birthday with min errors, Twinkle Twinkle Little Star with more frequent errors and halting despite mod verbal cues. Pt continues to benefit from verbal and visual cues (first letter/partial word) to target word finding deficits during tasks. Practicing increasing vocal intensity/yelling words like "help!" And "hey!" To utilize at home if patient needs assistance. Pt left in bed with alarm set and significant other in room, cont ST POC.  Pain Pain Assessment Pain Scale: 0-10 Pain Score: 0-No pain  Therapy/Group: Individual Therapy  Tacey Ruiz 07/06/2020, 3:19 PM

## 2020-07-06 NOTE — Progress Notes (Signed)
Physical Therapy Session Note  Patient Details  Name: Nathaniel Spears MRN: [id number] Date of Birth: 1975-08-12  Today's Date: 07/06/2020 PT Individual Time: 1000-1025 PT Individual Time Calculation (min): 25 min   Short Term Goals: Week 1:  PT Short Term Goal 1 (Week 1): Pt will complete bed mobility with supervision PT Short Term Goal 1 - Progress (Week 1): Met PT Short Term Goal 2 (Week 1): Pt will completed bed<>chair transfers with CGA consistently PT Short Term Goal 2 - Progress (Week 1): Met PT Short Term Goal 3 (Week 1): Pt will ambulate 112f with CGA and LRAD PT Short Term Goal 3 - Progress (Week 1): Met PT Short Term Goal 4 (Week 1): Pt will negotiated up/down 12 steps with CGA and LRAD PT Short Term Goal 4 - Progress (Week 1): Met Week 2:  PT Short Term Goal 1 (Week 2): STG=LTG due to LOS  Skilled Therapeutic Interventions/Progress Updates:   Received pt supine in bed, pt agreeable to PT treatment, and denied any pain during session. Session with emphasis on functional mobility/transfers, generalized strengthening, dynamic standing balance/coordination, NMR, ambulation, and improved activity tolerance. Noted improvements in pt speaking quality and pt able to speak a few sentences throughout session. Pt performed bed mobility mod I and ambulated 1589fx 2 and 7567f 2 trials without AD and supervision to/from 70M ortho gym. Pt required cues for visual scanning and to increase cadence for safety. Worked on dynamic standing balance performing the following activities on BITS with emphasis on fine motor control, sequencing, and coordination.  -Replicating shapes x 2 trials. Pt able to state correct shape but unable to draw shape displayed. When asked to draw circle pt drew triangle and required hand over hand guidance to initiate start of circle but after assist from therapist to begin circle, pt able to finish.  -Tracing letters/numbers x 3 trials. Pt required mod cues to complete  letter/number and often fixated on tracing back over same line over and over. Also noted pt with mild R inattention requiring cues for scanning. Concluded session with pt sitting in recliner, needs within reach, and seatbelt alarm on. NT at bedside changing linens.   Therapy Documentation Precautions:  Precautions Precautions: Fall Precaution Comments: expressive and receptive aphasia Restrictions Weight Bearing Restrictions: No  Therapy/Group: Individual Therapy AnnAlfonse Alpers, DPT   07/06/2020, 7:21 AM

## 2020-07-06 NOTE — Plan of Care (Signed)
  Problem: Consults Goal: RH STROKE PATIENT EDUCATION Description: See Patient Education module for education specifics  Outcome: Progressing   Problem: RH BOWEL ELIMINATION Goal: RH STG MANAGE BOWEL WITH ASSISTANCE Description: STG Manage Bowel with mod I Assistance. Outcome: Progressing   Problem: RH BLADDER ELIMINATION Goal: RH STG MANAGE BLADDER WITH ASSISTANCE Description: STG Manage Bladder With  mod I Assistance Outcome: Progressing   Problem: RH SAFETY Goal: RH STG ADHERE TO SAFETY PRECAUTIONS W/ASSISTANCE/DEVICE Description: STG Adhere to Safety Precautions With  cues/reminders Assistance/Device. Outcome: Progressing   Problem: RH KNOWLEDGE DEFICIT Goal: RH STG INCREASE KNOWLEDGE OF DIABETES Description: Patient and significant other will be able to manage DM with medications and dietary modifications using handouts and educational resources independently Outcome: Progressing Goal: RH STG INCREASE KNOWLEDGE OF HYPERTENSION Description: Patient and significant other will be able to manage HTN with medications and dietary modifications using handouts and educational resources independently Outcome: Progressing Goal: RH STG INCREASE KNOWLEGDE OF HYPERLIPIDEMIA Description: Patient and significant other will be able to manage HLD with medications and dietary modifications using handouts and educational resources independently Outcome: Progressing Goal: RH STG INCREASE KNOWLEDGE OF STROKE PROPHYLAXIS Description: Patient and significant other will be able to manage secondary stroke prophylaxis with medications and dietary modifications using handouts and educational resources independently Outcome: Progressing   Problem: RH Vision Goal: RH LTG Vision (Specify) Outcome: Progressing

## 2020-07-06 NOTE — Progress Notes (Signed)
PROGRESS NOTE   Subjective/Complaints: No complaints this morning K+ 3.4, supplement with BID and repeat tomorrow.  Severe expressive aphasia Cognition appears to be improving as per nursing.   ROS: Limited due to aphasia   Objective:   No results found. No results for input(s): WBC, HGB, HCT, PLT in the last 72 hours.  Recent Labs    07/05/20 0919 07/06/20 0457  NA 139 140  K 3.8 3.4*  CL 102 102  CO2 29 23  GLUCOSE 108* 90  BUN 10 14  CREATININE 1.20 1.28*  CALCIUM 9.5 9.5    Intake/Output Summary (Last 24 hours) at 07/06/2020 1235 Last data filed at 07/06/2020 0715 Gross per 24 hour  Intake 326 ml  Output --  Net 326 ml        Physical Exam: Vital Signs Blood pressure 108/72, pulse 74, temperature 98 F (36.7 C), resp. rate 18, height 6\' 1"  (1.854 m), weight 104.7 kg, SpO2 98 %. Gen: no distress, normal appearing Gen: no distress, normal appearing HEENT: oral mucosa pink and moist, NCAT Cardio: Reg rate Chest: normal effort, normal rate of breathing Abd: soft, non-distended Ext: no edema Psych: pleasant, normal affect   Skin: intact Neurologic: Cranial nerves II through XII intact, motor strength is 5/5 in bilateral deltoid, bicep, tricep, grip. Standing with good balance Aphasic- not consistent with Y/N, does seem to comprehend most basic information Sensory exam unable to assess due to aphasia    Musculoskeletal: Full range of motion in all 4 extremities. No joint swelling   Assessment/Plan: 1. Functional deficits which require 3+ hours per day of interdisciplinary therapy in a comprehensive inpatient rehab setting. Physiatrist is providing close team supervision and 24 hour management of active medical problems listed below. Physiatrist and rehab team continue to assess barriers to discharge/monitor patient progress toward functional and medical goals  Care Tool:  Bathing    Body  parts bathed by patient: Right arm, Left arm, Right upper leg, Right lower leg, Left upper leg, Face, Chest, Front perineal area, Buttocks, Abdomen, Left lower leg   Body parts bathed by helper: Right arm, Left arm, Front perineal area, Buttocks     Bathing assist Assist Level: Contact Guard/Touching assist     Upper Body Dressing/Undressing Upper body dressing   What is the patient wearing?: Pull over shirt    Upper body assist Assist Level: Set up assist    Lower Body Dressing/Undressing Lower body dressing      What is the patient wearing?: Underwear/pull up, Pants     Lower body assist Assist for lower body dressing: Set up assist     Toileting Toileting    Toileting assist Assist for toileting: Contact Guard/Touching assist     Transfers Chair/bed transfer  Transfers assist     Chair/bed transfer assist level: Supervision/Verbal cueing     Locomotion Ambulation   Ambulation assist      Assist level: Supervision/Verbal cueing Assistive device: No Device Max distance: 156ft   Walk 10 feet activity   Assist     Assist level: Supervision/Verbal cueing Assistive device: No Device   Walk 50 feet activity   Assist  Assist level: Supervision/Verbal cueing Assistive device: No Device    Walk 150 feet activity   Assist    Assist level: Supervision/Verbal cueing Assistive device: No Device    Walk 10 feet on uneven surface  activity   Assist     Assist level: Minimal Assistance - Patient > 75% Assistive device: Other (comment) (no Device)   Wheelchair     Assist Will patient use wheelchair at discharge?: No   Wheelchair activity did not occur: N/A         Wheelchair 50 feet with 2 turns activity    Assist    Wheelchair 50 feet with 2 turns activity did not occur: N/A       Wheelchair 150 feet activity     Assist  Wheelchair 150 feet activity did not occur: N/A       Blood pressure 108/72, pulse 74,  temperature 98 F (36.7 C), resp. rate 18, height 6\' 1"  (1.854 m), weight 104.7 kg, SpO2 98 %.    Medical Problem List and Plan: 1.  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  Impaired mobility -DVT/anticoagulation: D/c Lovenox since ambulating >500 feet             -antiplatelet therapy: Continue Aspirin 81 mg daily and Brilinta 90 mg twice daily x30 days then aspirin 325 mg daily and Plavix 75 mg daily for another 2 months and then Plavix alone 3. Pain Management: Continue Tylenol as needed 4. Mood: Continue Prozac 20 mg daily             -antipsychotic agents: N/A 5. Neuropsych: This patient is not capable of making decisions on his own behalf. 6. Skin/Wound Care: Routine skin checks 7. Fluids/Electrolytes/Nutrition:   Encourage po intake  8.  Diabetes mellitus.  Hemoglobin A1c 9.9. Check blood sugars before meals and at bedtime.  Diabetic teaching  CBG (last 3)  Recent Labs    07/04/20 1616 07/04/20 2056 07/05/20 0610  GLUCAP 98 91 102*    -6/20: stable: d/c ISS. D/c levemir 9.  Seizure prophylaxis.  Completed course of Keppra 1000 mg twice daily, discontinue 10.  Hypertension.  Elevated. Increase Norvasc to 10mg  daily on 6/9, HCTZ 12.5 mg daily.  orthostasis- con't regimen 11.  Hyperlipidemia.  Lipitor 12.  History of tobacco use.  Counseling 13. Hypokalemia: supp 14. AKI: normal on 6/20. Placed nursing order to encourage 6-8 glasses of water per day, Cr increased 6/17 likely 2/2 metformin, d/c medication, repeat Cr Monday 15. Constipation: resolved 16. Vitamin D deficiency: Continue Ergocalciferol 50,000U weekly.  17. Diarrhea: Resolved, d/c imoidum 18. Tachycardia:  toprol-XL 12.5mg  started. Resolved. Continue to monitor HR TID 19. Hypomagnesemia: check  magnesium level tomorrow.  16. Disposition: HFU scheduled with Dr. 7/17 on Tuesday. D/c 6/23. Discharging home with his girfriend.   LOS: 13 days A FACE TO FACE EVALUATION WAS PERFORMED  [id number] 07/06/2020,

## 2020-07-06 NOTE — Progress Notes (Signed)
Occupational Therapy Session Note  Patient Details  Name: Nathaniel Spears MRN: [id number] Date of Birth: 01-Jan-1976  Today's Date: 07/06/2020 OT Individual Time: 0858-1000 OT Individual Time Calculation (min): 62 min    Short Term Goals: Week 1:  OT Short Term Goal 1 (Week 1): patient will complete functional transfers and bed mobility with CS OT Short Term Goal 1 - Progress (Week 1): Not met OT Short Term Goal 2 (Week 1): patient will complete adl tasks/grooming/bathing with min A and min cues for initiation, sequencing OT Short Term Goal 2 - Progress (Week 1): Not met OT Short Term Goal 3 (Week 1): patient will complete basic meal prep 2-3 step task with CS and mod cues OT Short Term Goal 3 - Progress (Week 1): Not met OT Short Term Goal 4 (Week 1): patient will scan right side of environment with min cues OT Short Term Goal 4 - Progress (Week 1): Not met Week 2:  OT Short Term Goal 1 (Week 2): Patient will complete functional transfers with close spvsn consistently. OT Short Term Goal 2 (Week 2): Patient will complete ADL task with min A and no more than min cues for initiation and sequencing. OT Short Term Goal 3 (Week 2): Patient will complete simple meal prep with close spvsn and mod cues. OT Short Term Goal 4 (Week 2): Patient will complete ADL task with no more than min cues to scan R task environment. OT Short Term Goal 5 (Week 2): Patient will complete functional ambulation during ADL task with no more than 1-2 cues for safe DME use.  Skilled Therapeutic Interventions/Progress Updates:    Pt received sitting EOB and consented to OT tx. Pt seen for morning ADL routine instruction including bathing, dressing, and functional transfer training. Pt transferred to shower chiar in walk in shower with CGA and bathed seated and standing with cuing to attend to R side of body. Pt seem to perseverate on only washing L arm, required max cues to bathe the res tof his body and tactile cuing to  bathe R side of body. Pt then walked with CGA to sit EOB for dressing, completed UB dressing with setup and LB dressing with SUP. Pt handed socks and shoes at same time, sat and stared at both for a few minutes before therapist cued pt to put on socks first. Pt put on L sock first, then cues to put on R sock, which was sitting on the bed next to him on his R side. After dressing, pt walked with no AD and CGA down to gym to complete BITS sequencing activity. He was instructed in counting task and to say numbers as he went. Pt continues to demo expressive aphasia, says "7" for a few of the numbers but he becomes visibly frustrated as he knows it isn't right. Pt able to say all numbers with time and cues. Pt also says "um " a lot during session and is frustrated he can't get any other words out. Pt instructed to take his time and be patient with himself during speech.  After tx, pt walked back to room with CGA and left sitting EOB with alarm on and all needs met.  Therapy Documentation Precautions:  Precautions Precautions: Fall Precaution Comments: expressive and receptive aphasia Restrictions Weight Bearing Restrictions: No  Vital Signs: Therapy Vitals Temp: 98 F (36.7 C) Pulse Rate: 74 Resp: 18 BP: 108/72 Patient Position (if appropriate): Lying Oxygen Therapy SpO2: 98 % O2 Device: Room Air Pain: none  Therapy/Group: Individual Therapy  Marykathryn Carboni 07/06/2020, 8:30 AM

## 2020-07-06 NOTE — Progress Notes (Signed)
Pt sister Larnce Schnackenberg called ph: [phone].  Wanted update on pt, progress, and plan of care. Her questions involved medical changes that I was unable to answer. I informed her that would leave message for PA to return her tomorrow. I asked if needed any discharge information and would leave message for SW. She requested PA/ MD to return call. Left note in MD office.

## 2020-07-06 NOTE — Progress Notes (Signed)
Patient ID: Nathaniel Spears, male   DOB: May 08, 1975, 45 y.o.   MRN: [id number] Team Conference Report to Patient/Family  Team Conference discussion was reviewed with the patient and caregiver, including goals, any changes in plan of care and target discharge date.  Patient and caregiver express understanding and are in agreement.  The patient has a target discharge  date of 07/08/20.   Sister confirmed family will purchase RW and TTB. Provided sister with conference updates. No additional questions or concerns, SW will cont to follow up.  Andria Rhein 07/06/2020, 2:12 PM

## 2020-07-06 NOTE — Progress Notes (Signed)
Physical Therapy Session Note  Patient Details  Name: Nathaniel Spears MRN: [id number] Date of Birth: 07/24/1975  Today's Date: 07/06/2020 PT Individual Time: 5361-4431 PT Individual Time Calculation (min): 53 min   Short Term Goals: Week 1:  PT Short Term Goal 1 (Week 1): Pt will complete bed mobility with supervision PT Short Term Goal 1 - Progress (Week 1): Met PT Short Term Goal 2 (Week 1): Pt will completed bed<>chair transfers with CGA consistently PT Short Term Goal 2 - Progress (Week 1): Met PT Short Term Goal 3 (Week 1): Pt will ambulate 148f with CGA and LRAD PT Short Term Goal 3 - Progress (Week 1): Met PT Short Term Goal 4 (Week 1): Pt will negotiated up/down 12 steps with CGA and LRAD PT Short Term Goal 4 - Progress (Week 1): Met Week 2:  PT Short Term Goal 1 (Week 2): STG=LTG due to LOS Week 3:     Skilled Therapeutic Interventions/Progress Updates:   Pain:  Pt reports no pain.  Treatment to tolerance.  Rest breaks and repositioning as needed.  Pt initially oob in recliner and agreeable to treatment session w/focus on functional gait, dynamic balance, r inattention.  Sit to stand and gait x >2285fw/cga.  Pt unable to locate correct floor button when instructed to "push 4" on elevator. Functional gait: 10065f/cga locating cones placed in r side of environment then stacking them on stack using R hand w/decreased RUE coordination vs perceptual deficit w/task.    Standing w/pile of cards to L side, reaches and chooses card w/R hand, then places on master card to r side to match.  Initially hand over hand assist and max cues.  Pt able to verbalize card name approx 20%time, difficulty transitioning from one card id to next but able to do so when given initial sound.   Pt rested in sitting x 4-5 min. Sit to stand and pt repeated activity w/good carryover x 3 cards, moderate cueing, then pt appeared less responsive, mod assist to pivot to wc.  When asked if he felt "dizzy" pt  affirms.  Les elevated on stool.  Pt responsive but appears fatigued.  Again confirms he feels dizzy when asked.  BP assessed but approx 3 min lapsed in sitting as BP cuff obtained and reading obtained.  BP WNL after this time. After several min, armchair to wc stand pivot transfer w/cga for transport back to room for safety.   When entering room, pt states to girlfriend "got dizzy".  stand pivot transfer to recliner w/cga.  Nursing notified of symptoms and in to assess pt.  Nursing stated likely due to BP meds given this am that were no longer needed and dc'd this pm.  Primary therapist notified of episode. Pt left oob in recliner w/chair alarm set and needs in reach.     Therapy Documentation Precautions:  Precautions Precautions: Fall Precaution Comments: expressive and receptive aphasia Restrictions Weight Bearing Restrictions: No     Therapy/Group: Individual Therapy BarCallie FieldingT [place]21/2022, 3:12 PM

## 2020-07-06 NOTE — Discharge Summary (Signed)
Physician Discharge Summary  Patient ID: Nathaniel Spears MRN: [id number] DOB/AGE: August 19, 1975 45 y.o.  Admit date: 06/23/2020 Discharge date: 07/08/2020  Discharge Diagnoses:  Principal Problem:   Acute ischemic stroke [facility]) Active Problems:   Middle cerebral artery embolism, bilateral   AKI (acute kidney injury) (HCC) DVT prophylaxis Diabetes mellitus Hypertension Hyperlipidemia Tobacco abuse Constipation AKI  Discharged Condition: Stable  Significant Diagnostic Studies: CT ANGIO NECK W OR WO CONTRAST  Result Date: 06/15/2020 CLINICAL DATA:  Right-sided deficits and aphasia EXAM: CT ANGIOGRAPHY HEAD AND NECK TECHNIQUE: Multidetector CT imaging of the head and neck was performed using the standard protocol during bolus administration of intravenous contrast. Multiplanar CT image reconstructions and MIPs were obtained to evaluate the vascular anatomy. Carotid stenosis measurements (when applicable) are obtained utilizing NASCET criteria, using the distal internal carotid diameter as the denominator. CONTRAST:  69mL OMNIPAQUE IOHEXOL 350 MG/ML SOLN COMPARISON:  CTA head neck 05/31/2020 FINDINGS: CT HEAD FINDINGS Brain: Old infarcts of the left cerebellum and posterior left parietal lobe. No acute hemorrhage. The size and configuration of the ventricles and extra-axial CSF spaces are normal. There is hypoattenuation of the periventricular white matter, most commonly indicating chronic ischemic microangiopathy. Skull: The visualized skull base, calvarium and extracranial soft tissues are normal. Sinuses/Orbits: No fluid levels or advanced mucosal thickening of the visualized paranasal sinuses. No mastoid or middle ear effusion. The orbits are normal. CTA NECK FINDINGS SKELETON: There is no bony spinal canal stenosis. No lytic or blastic lesion. OTHER NECK: Normal pharynx, larynx and major salivary glands. No cervical lymphadenopathy. Unremarkable thyroid gland. UPPER CHEST: No pneumothorax or pleural  effusion. No nodules or masses. AORTIC ARCH: There is no calcific atherosclerosis of the aortic arch. There is no aneurysm, dissection or hemodynamically significant stenosis of the visualized portion of the aorta. Aberrant right subclavian artery. The visualized proximal subclavian arteries are widely patent. RIGHT CAROTID SYSTEM: Normal without aneurysm, dissection or stenosis. LEFT CAROTID SYSTEM: Normal without aneurysm, dissection or stenosis. VERTEBRAL ARTERIES: Left dominant configuration. Both origins are clearly patent. There is no dissection, occlusion or flow-limiting stenosis to the skull base (V1-V3 segments). CTA HEAD FINDINGS POSTERIOR CIRCULATION: --Vertebral arteries: Normal V4 segments. --Inferior cerebellar arteries: Normal. --Basilar artery: Normal. --Superior cerebellar arteries: Normal. --Posterior cerebral arteries (PCA): Mild bilateral multifocal stenoses are unchanged. ANTERIOR CIRCULATION: --Intracranial internal carotid arteries: Normal. --Anterior cerebral arteries (ACA): Unchanged area of severe stenosis of the left A2 segment --Middle cerebral arteries (MCA): There are multifocal severe bilateral stenoses, unchanged. Stenosis are most severe at the distal M1 and proximal M2 segments. VENOUS SINUSES: As permitted by contrast timing, patent. ANATOMIC VARIANTS: None Review of the MIP images confirms the above findings. IMPRESSION: 1. No emergent large vessel occlusion. 2. Unchanged multifocal severe intracranial stenoses, including severe stenosis of the left anterior cerebral artery A2 segment and severe bilateral MCA distal M1 and proximal M2 stenoses. 3. 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 4. Aberrant right subclavian artery. Electronically Signed   By: Deatra Robinson M.D.   On: 06/15/2020 21:42   MR BRAIN WO CONTRAST  Result Date: 06/16/2020 CLINICAL DATA:  45 year old male with difficulty speaking. Progressive multifocal cerebral infarcts since 04/20/2020.  EXAM: MRI HEAD WITHOUT CONTRAST TECHNIQUE: Multiplanar, multiecho pulse sequences of the brain and surrounding structures were obtained without intravenous contrast. COMPARISON:  CTA head and neck 06/15/2020, brain MRI 06/04/2020 and earlier. FINDINGS: Brain: Since 06/04/2020, small new cortical infarcts of the left superior frontal gyrus pre motor area (series 5, image 93), increased  infarct with restricted diffusion in the left cingulate gyrus (series 5, image 85)., progressed and more confluent right periatrial white matter infarct on image 81, and small new posterior right corona radiata infarct nearby on image 84. No significant mass effect. No malignant hemorrhagic transformation. Other widely scattered ischemic foci with abnormal diffusion in the MCA and left ACA territories were pre-existing. Deep gray nuclei, PCA territories, and posterior fossa relatively spared as before; small left cerebellar infarct seen last month has faded on diffusion series 5, image 67. Superimposed subacute or chronic infarcts in the left parietal lobe and left PICA territory with developing encephalomalacia. Scattered hemosiderin in both hemispheres appears stable on SWI. No midline shift, mass effect, evidence of mass lesion, ventriculomegaly. Cervicomedullary junction and pituitary are within normal limits. Vascular: Major intracranial vascular flow voids are stable with intracranial artery tortuosity. Skull and upper cervical spine: Stable. Visualized bone marrow signal is within normal limits. Sinuses/Orbits: Largely resolved paranasal sinus disease since last month. Orbits remain negative. Other: Trace right mastoid fluid is decreased. Grossly normal visible internal auditory structures. Visible scalp and face appear negative. IMPRESSION: 1. Once again several new or increased acute infarcts are demonstrated in both cerebral hemispheres (today the bilateral MCA and left ACA territories) since the recent MRI 06/04/2020. No  malignant hemorrhagic transformation or mass effect. 2. Stable or expected evolution of ischemia elsewhere, including developing encephalomalacia in the left parietal lobe and the left cerebellum (PICA) territories. 3. Improved paranasal sinus aeration since last month. Electronically Signed   By: Odessa Fleming M.D.   On: 06/16/2020 07:09   EEG adult  Result Date: 06/17/2020 Charlsie Quest, MD     06/17/2020 10:22 AM Patient Name: Terrion Gencarelli MRN: [id number] Epilepsy Attending: Charlsie Quest Referring Physician/Provider: Shon Hale, NP Date: 06/18/2018 Duration: 26.40 minutes Patient history: 45 year old male with multiple strokes and seizure-like activity.  EEG to evaluate for seizures. Level of alertness: Awake AEDs during EEG study: Keppra Technical aspects: This EEG study was done with scalp electrodes positioned according to the 10-20 International system of electrode placement. Electrical activity was acquired at a sampling rate of 500Hz  and reviewed with a high frequency filter of 70Hz  and a low frequency filter of 1Hz . EEG data were recorded continuously and digitally stored. Description: No clear posterior dominant rhythm was seen. EEG showed continuous generalized 3 to 6 Hz theta-delta slowing as well as intermittent generalized and lateralized right hemisphere rhythmic 2 to 3 Hz delta slowing.  Hyperventilation and photic stimulation were not performed.   ABNORMALITY - Continuous slow, generalized -Intermittent rhythmic delta slowing, generalized and lateralized right hemisphere IMPRESSION: This study is suggestive of cortical dysfunction in right hemisphere likely secondary to underlying stroke. Additionally, there is moderate diffuse encephalopathy, non specific etiology. No seizures or epileptiform discharges were seen throughout the recording. Priyanka   Overnight EEG with video  Result Date: 06/18/2020 , MD     06/19/2020  8:54 AM Patient Name: Adan Baehr MRN:  Charlsie Quest Epilepsy Attending: 08/19/2020 Referring Physician/Provider: [name], NP Duration: 06/17/2020 Charlsie Quest to 06/18/2020 08/17/2020   Patient history: 45 year old male with multiple strokes and seizure-like activity.  EEG to evaluate for seizures.   Level of alertness: Awake, asleep   AEDs during EEG study: Keppra   Technical aspects: This EEG study was done with scalp electrodes positioned according to the 10-20 International system of electrode placement. Electrical activity was acquired at a sampling rate of 500Hz  and reviewed with a high  frequency filter of  and a low frequency filter of . EEG data were recorded continuously and digitally stored.   Description: No clear posterior dominant rhythm was seen. EEG showed continuous generalized 3 to 6 Hz theta-delta slowing as well as intermittent generalized and lateralized right hemisphere rhythmic 2 to 3 Hz delta slowing.  Hyperventilation and photic stimulation were not performed.     ABNORMALITY - Continuous slow, generalized -Intermittent rhythmic delta slowing, generalized and lateralized right hemisphere   IMPRESSION: This study  showed lateralized rhythmic delta activity which is on the ictal-interictal continuum with low potential for seizures and is suggestive of cortical dysfunction in right hemisphere likely secondary to underlying stroke. Additionally, there is moderate diffuse encephalopathy, non specific etiology. No seizures or epileptiform discharges were seen throughout the recording.   Priyanka Annabelle Harman   CT ANGIO HEAD CODE STROKE  Result Date: 06/15/2020 CLINICAL DATA:  Right-sided deficits and aphasia EXAM: CT ANGIOGRAPHY HEAD AND NECK TECHNIQUE: Multidetector CT imaging of the head and neck was performed using the standard protocol during bolus administration of intravenous contrast. Multiplanar CT image reconstructions and MIPs were obtained to evaluate the vascular anatomy. Carotid stenosis measurements (when applicable)  are obtained utilizing NASCET criteria, using the distal internal carotid diameter as the denominator. CONTRAST:  75mL OMNIPAQUE IOHEXOL 350 MG/ML SOLN COMPARISON:  CTA head neck 05/31/2020 FINDINGS: CT HEAD FINDINGS Brain: Old infarcts of the left cerebellum and posterior left parietal lobe. No acute hemorrhage. The size and configuration of the ventricles and extra-axial CSF spaces are normal. There is hypoattenuation of the periventricular white matter, most commonly indicating chronic ischemic microangiopathy. Skull: The visualized skull base, calvarium and extracranial soft tissues are normal. Sinuses/Orbits: No fluid levels or advanced mucosal thickening of the visualized paranasal sinuses. No mastoid or middle ear effusion. The orbits are normal. CTA NECK FINDINGS SKELETON: There is no bony spinal canal stenosis. No lytic or blastic lesion. OTHER NECK: Normal pharynx, larynx and major salivary glands. No cervical lymphadenopathy. Unremarkable thyroid gland. UPPER CHEST: No pneumothorax or pleural effusion. No nodules or masses. AORTIC ARCH: There is no calcific atherosclerosis of the aortic arch. There is no aneurysm, dissection or hemodynamically significant stenosis of the visualized portion of the aorta. Aberrant right subclavian artery. The visualized proximal subclavian arteries are widely patent. RIGHT CAROTID SYSTEM: Normal without aneurysm, dissection or stenosis. LEFT CAROTID SYSTEM: Normal without aneurysm, dissection or stenosis. VERTEBRAL ARTERIES: Left dominant configuration. Both origins are clearly patent. There is no dissection, occlusion or flow-limiting stenosis to the skull base (V1-V3 segments). CTA HEAD FINDINGS POSTERIOR CIRCULATION: --Vertebral arteries: Normal V4 segments. --Inferior cerebellar arteries: Normal. --Basilar artery: Normal. --Superior cerebellar arteries: Normal. --Posterior cerebral arteries (PCA): Mild bilateral multifocal stenoses are unchanged. ANTERIOR CIRCULATION:  --Intracranial internal carotid arteries: Normal. --Anterior cerebral arteries (ACA): Unchanged area of severe stenosis of the left A2 segment --Middle cerebral arteries (MCA): There are multifocal severe bilateral stenoses, unchanged. Stenosis are most severe at the distal M1 and proximal M2 segments. VENOUS SINUSES: As permitted by contrast timing, patent. ANATOMIC VARIANTS: None Review of the MIP images confirms the above findings. IMPRESSION: 1. No emergent large vessel occlusion. 2. Unchanged multifocal severe intracranial stenoses, including severe stenosis of the left anterior cerebral artery A2 segment and severe bilateral MCA distal M1 and proximal M2 stenoses. 3. 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 4. Aberrant right subclavian artery. Electronically Signed   By: Deatra Robinson M.D.   On: 06/15/2020 21:42    Labs:  Basic Metabolic Panel: Recent Labs  Lab 07/02/20 0540 07/05/20 0919 07/06/20 0457 07/07/20 0518  NA 139 139 140 138  K 3.9 3.8 3.4* 3.3*  CL 104 102 102 104  CO2 27 29 23 26   GLUCOSE 100* 108* 90 103*  BUN 11 10 14 18   CREATININE 1.25* 1.20 1.28* 1.38*  CALCIUM 9.4 9.5 9.5 9.0  MG  --   --   --  2.1    CBC: No results for input(s): WBC, NEUTROABS, HGB, HCT, MCV, PLT in the last 168 hours.  CBG: Recent Labs  Lab 07/06/20 1621 07/06/20 2101 07/07/20 0554 07/07/20 1151 07/07/20 1636  GLUCAP 153* 113* 111* 103* 201*   Family history.  Negative for CVA colon cancer esophageal cancer or rectal cancer  Brief HPI:   Kara Diesntonio Kistler is a 45 y.o. right-handed male with history of hypertension hyperlipidemia diabetes mellitus and tobacco use.  Per chart review lives with spouse.  Two-level home 6 steps to entry.  Prior CVA April 2022 independent in all tasks.  Patient with recent admission for recurrent strokes 04/20/2020 left-sided weakness and aphasia.  [facility]y services.  Current work-up was negative for vasculitis.   He returned to [facility] 06/15/2020 with word finding deficits.  MRI of the brain showed several new or increased acute infarcts in both cerebral hemispheres.  CTA of head and neck showed no large vessel occlusion.  Unchanged multifocal severe intracranial stenosis.  EEG was negative for seizure but did suggest cortical dysfunction in the right hemisphere secondary to underlying stroke.  It was also showed diffuse encephalopathy nonspecific in etiology.  Maintained on Keppra for seizure prophylaxis.  Most recent echocardiogram with ejection fraction of 60 to 65% no wall motion abnormalities.  Patient was placed on aspirin and Plavix as well as statin prior to arrival to [facility].  He was changed to aspirin 81 mg and Brilinta 90 mg twice daily that he will continue on for 30 days then aspirin 325 mg and Plavix 75 mg daily for another 2 months and then Plavix alone.  Lovenox for DVT prophylaxis.  Therapy evaluations completed due to patient's decreased functional ability and aphasia was admitted for a comprehensive rehab program.   Hospital Course: Kara Diesntonio Tackett was admitted to rehab 06/23/2020 for inpatient therapies to consist of PT, ST and OT at least three hours five days a week. Past admission physiatrist, therapy team and rehab RN have worked together to provide customized collaborative inpatient rehab.  Pertain to patient bilateral MCA and left ACA territory infarctions currently maintained on aspirin 81 mg daily and Brilinta 90 mg daily for 30 days then aspirin 325 mg daily and Plavix 75 mg daily for another 2 months then Plavix alone.  He would follow-up neurology services.  Maintained on Lovenox for DVT prophylaxis discontinued as patient now ambulating greater than 500 feet.  Mood stabilization with Prozac emotional support provided he was attending full therapies.  Blood pressure controlled on Norvasc and his HCTZ was held due to some mild AKI and would continue outpatient  monitoring.  Lipitor for hyperlipidemia.  His blood sugars were controlled hemoglobin A1c 9.1 Levemir insulin as directed with full diabetic teaching.  Seizure prophylaxis completing course of Keppra.  History of tobacco abuse patient receiving counsel regards to cessation of nicotine products.  He did have some mild tachycardia no chest pain initiation of Toprol.   Blood pressures were monitored on TID basis and controlled  Rehab course: During patient's stay in rehab weekly team conferences were held to monitor patient's progress, set goals and discuss barriers to discharge. At admission, patient required minimal assist sit to stand minimal assist 20 feet 1 person hand-held assist minimal assist supine to sit  Physical exam.  Blood pressure 129/100 pulse 97 temperature 97.6 respirations 18 oxygen saturations 98% room air Constitutional.  No acute distress HEENT Head.  Normocephalic and atraumatic Eyes.  Pupils round and reactive to light no discharge without nystagmus Neck.  Supple nontender no JVD without thyromegaly Cardiac regular rate rhythm without extra sounds or murmur heard Abdomen.  Soft nontender positive bowel sounds without rebound Respiratory effort normal no respiratory distress without wheeze Neurologic.  Alert expressive receptive aphasia.  He was able to state his name.  Unable to name objects.  Cranial nerves II through XII intact motor strength 5/5 in bilateral deltoid bicep tricep grip does not cooperate well with lower extremity testing.  He/She  has had improvement in activity tolerance, balance, postural control as well as ability to compensate for deficits. He/She has had improvement in functional use RUE/LUE  and RLE/LLE as well as improvement in awareness.  Perform supine to sit donned shoes with supervision.  Ambulates 180 feet to the Ortho gym able to negotiate on elevator with cues and supervision.  Performed crossing over in front at wall rail with mod max cues  and facilitation with demonstration for technique and sequencing.  Step taps to cones of 2 colors patient able to state colors with initial syllable cue for only one of the colors needed.  Patient negotiated stairs contact-guard close supervision.  He did require max cueing for initiation of tasks.  SLP facilitated phrase completion task with minimal verbal cues and 90% accuracy.  It was discussed with family need for supervision for his safety discharge planning completed and discharged to home       Disposition: Discharged to home    Diet: Regular  Special Instructions: No driving smoking or alcohol  Plan aspirin 81 mg daily and Brilinta 90 mg twice daily until 07/15/2020 then begin aspirin 325 mg daily and Plavix 75 mg daily beginning 07/16/2020 x 2 months then Plavix alone  Medications at discharge 1.  Tylenol as needed 2.  Norvasc 10 mg p.o. daily 3.  Lipitor 80 mg p.o. daily 4.  Prozac 20 mg p.o. daily 5  Levemir 10 units nightly 6.  Toprol-XL 12.5 mg daily 7.  Vitamin D 50,000 units every 7 days 8.  aspirin 81 mg p.o. daily until 07/15/2020 9.  Brilinta 90 mg twice daily until 07/15/2020 10.  Plavix 75 mg daily beginning 07/16/2020 11.  Aspirin 325 mg daily beginning 07/16/2020  30-35 minutes were spent completing discharge summary and discharge planning  Discharge Instructions     Ambulatory referral to Neurology   Complete by: As directed    An appointment is requested in approximately: 4 weeks bilateral MCA and left ACA territory infarctions   Ambulatory referral to Physical Medicine Rehab   Complete by: As directed    Moderate complexity follow-up 1 to 2 weeks bilateral MCA and left ACA territory infarctions        Follow-up Information     Raulkar, Drema Pry, MD Follow up.   Specialty: Physical Medicine and Rehabilitation Why: 09/03/20 11:40am Contact information: [address] [place] [address] [phone]                  Signed: Reuel Boom  J Axelle Szwed 07/08/2020, 5:26 AM

## 2020-07-06 NOTE — Patient Care Conference (Signed)
Inpatient RehabilitationTeam Conference and Plan of Care Update Date: 07/06/2020   Time: 13:47 PM    Patient Name: Nathaniel Spears      Medical Record Number: [id number]  Date of Birth: November 07, 1975 Sex: Male         Room/Bed: 5C06C/5C06C-01 Payor Info: Payor: /    Admit Date/Time:  06/23/2020  3:43 PM  Primary Diagnosis:  Acute ischemic stroke [facility])  Hospital Problems: Principal Problem:   Acute ischemic stroke [facility]) Active Problems:   Middle cerebral artery embolism, bilateral    Expected Discharge Date: Expected Discharge Date: 07/08/20  Team Members Present: Physician leading conference: Dr. Sula Soda Care Coodinator Present: Chana Bode, RN, BSN, CRRN;Becky Dupree, LCSW Nurse Present: Chana Bode, RN PT Present: Raechel Chute, PT OT Present: Other (comment) Ocie Cornfield, OT) SLP Present: Colin Benton, SLP PPS Coordinator present : Edson Snowball, PT     Current Status/Progress Goal Weekly Team Focus  Bowel/Bladder   Pt cont of B/B but requires assist and . LBM 07/04/20  Pt will be cont with no assist  Toilet pt Q2h and PRN   Swallow/Nutrition/ Hydration             ADL's   SPT/ambulation with CGA without AD, adl - UB/LB min A and frequent cues due to perseveration/sequencing/initiation deficits  supervision/setup  ADL retraining, functional cognition, balance, problem solving, attention   Mobility   bed mobility supervision, transfers without AD supervision, gait >140ft without AD supervision, 16 steps 2 rails CGA/supervision  supervision, mod I transfers  functional mobility/transfers, generalized strengthening, dynamic standing balance/coordination, gait training, NMR, motor control/sequencing, family education, and improved activity tolerance.   Communication   mod A commands and expression  min A for comprehension and mod A for verbal expression  reading/writting, naming and functional phrases   Safety/Cognition/ Behavioral Observations  Min A  Supervision A   basic problem solving   Pain   Pt denies no pain  Pt will remain pain free  Assess for pain qshift and provide PRN   Skin   Skin is intact  skin remain clean and intact  Assess skin qshift and provide skin care prn     Discharge Planning:  Discharging home with S.O.   Team Discussion: Motor planning issues, requires mod - max cues for efficiency due to perseveration on left side and right side neglect.  Patient on target to meet rehab goals: yes, supervision goals set for discharge and on target to meet goals.  *See Care Plan and progress notes for long and short-term goals.   Revisions to Treatment Plan:  MD to discontinue insulin at discharge Working on problem solving and attention and initiation   Teaching Needs: Secondary stroke risk management,medications, safety, cues, etc.  Current Barriers to Discharge: Decreased caregiver support and Home enviroment access/layout  Possible Resolutions to Barriers: Family education with significant other     Medical Summary Current Status: severe expressive aphasia, HTN, AKI, tachycardia, hypokalemia, vitamin D deficiency  Barriers to Discharge: Medical stability  Barriers to Discharge Comments: severe expressive aphasia, HTN, AKI, tachycardia, hypokalemia, vitamin D deficiency Possible Resolutions to [employer] Focus: stop lisinopril and KCTZ, supplement K+ today and repeat level tomorrow, started ergocalciferol 50,000U weekly for 7 weeks   Continued Need for Acute Rehabilitation Level of Care: The patient requires daily medical management by a physician with specialized training in physical medicine and rehabilitation for the following reasons: Direction of a multidisciplinary physical rehabilitation program to maximize functional independence : Yes Medical management of  patient stability for increased activity during participation in an intensive rehabilitation regime.: Yes Analysis of laboratory values and/or radiology  reports with any subsequent need for medication adjustment and/or medical intervention. : Yes   I attest that I was present, lead the team conference, and concur with the assessment and plan of the team.   Chana Bode B 07/06/2020, 4:16 PM

## 2020-07-06 NOTE — Progress Notes (Signed)
Family in room. Was checking CBG at home. Has glucometer and test strips at home. She was also giving insulin every am. She was unsure of insulin name. Discussed CVA and right inattn. Educated that will have to cue verbally to look to right and continue to encourage looking to right side. Pt was also taking Metformin at home as well.

## 2020-07-07 ENCOUNTER — Other Ambulatory Visit (HOSPITAL_COMMUNITY): Payer: Self-pay

## 2020-07-07 DIAGNOSIS — N179 Acute kidney failure, unspecified: Secondary | ICD-10-CM

## 2020-07-07 LAB — BASIC METABOLIC PANEL
Anion gap: 8 (ref 5–15)
BUN: 18 mg/dL (ref 6–20)
CO2: 26 mmol/L (ref 22–32)
Calcium: 9 mg/dL (ref 8.9–10.3)
Chloride: 104 mmol/L (ref 98–111)
Creatinine, Ser: 1.38 mg/dL — ABNORMAL HIGH (ref 0.61–1.24)
GFR, Estimated: >60 mL/min (ref >60)
Glucose, Bld: 103 mg/dL — ABNORMAL HIGH (ref 70–99)
Potassium: 3.3 mmol/L — ABNORMAL LOW (ref 3.5–5.1)
Sodium: 138 mmol/L (ref 135–145)

## 2020-07-07 LAB — IRON AND TIBC
Iron: 69 ug/dL (ref 45–182)
Saturation Ratios: 20 % (ref 17.9–39.5)
TIBC: 343 ug/dL (ref 250–450)
UIBC: 274 ug/dL

## 2020-07-07 LAB — GLUCOSE, CAPILLARY
Glucose-Capillary: 111 mg/dL — ABNORMAL HIGH (ref 70–99)
Glucose-Capillary: 103 mg/dL — ABNORMAL HIGH (ref 70–99)
Glucose-Capillary: 201 mg/dL — ABNORMAL HIGH (ref 70–99)

## 2020-07-07 LAB — MAGNESIUM: Magnesium: 2.1 mg/dL (ref 1.7–2.4)

## 2020-07-07 MED ORDER — SODIUM CHLORIDE 0.9 % IV BOLUS
250.0000 mL | Freq: Once | INTRAVENOUS | Status: AC
Start: 2020-07-07 — End: 2020-07-07
  Administered 2020-07-07: 250 mL via INTRAVENOUS

## 2020-07-07 MED ORDER — ATORVASTATIN CALCIUM 80 MG PO TABS
80.0000 mg | ORAL_TABLET | Freq: Every day | ORAL | 0 refills | Status: DC
  Filled 2020-07-07: qty 30, 30d supply, fill #0

## 2020-07-07 MED ORDER — METOPROLOL SUCCINATE ER 25 MG PO TB24
12.5000 mg | ORAL_TABLET | Freq: Every day | ORAL | 0 refills | Status: DC
  Filled 2020-07-07 – 2020-08-10 (×3): qty 15, 30d supply, fill #0

## 2020-07-07 MED ORDER — TICAGRELOR 90 MG PO TABS
90.0000 mg | ORAL_TABLET | Freq: Two times a day (BID) | ORAL | 0 refills | Status: DC
  Filled 2020-07-07: qty 16, 8d supply, fill #0

## 2020-07-07 MED ORDER — LEVEMIR FLEXTOUCH 100 UNIT/ML ~~LOC~~ SOPN
10.0000 [IU] | PEN_INJECTOR | Freq: Every day | SUBCUTANEOUS | 11 refills | Status: DC
  Filled 2020-07-07: qty 3, 30d supply, fill #0

## 2020-07-07 MED ORDER — FLUOXETINE HCL 20 MG PO CAPS
20.0000 mg | ORAL_CAPSULE | Freq: Every day | ORAL | 0 refills | Status: DC
  Filled 2020-07-07: qty 30, 30d supply, fill #0

## 2020-07-07 MED ORDER — AMLODIPINE BESYLATE 10 MG PO TABS
10.0000 mg | ORAL_TABLET | Freq: Every day | ORAL | 0 refills | Status: DC
  Filled 2020-07-07: qty 30, 30d supply, fill #0

## 2020-07-07 MED ORDER — VITAMIN D (ERGOCALCIFEROL) 1.25 MG (50000 UNIT) PO CAPS
50000.0000 [IU] | ORAL_CAPSULE | ORAL | 0 refills | Status: DC
  Filled 2020-07-07: qty 5, 30d supply, fill #0

## 2020-07-07 MED ORDER — ASPIRIN 325 MG PO TBEC
DELAYED_RELEASE_TABLET | ORAL | 0 refills | Status: DC
  Filled 2020-07-07: qty 60, 60d supply, fill #0

## 2020-07-07 MED ORDER — CLOPIDOGREL BISULFATE 75 MG PO TABS
75.0000 mg | ORAL_TABLET | Freq: Every day | ORAL | 0 refills | Status: DC
  Filled 2020-07-07: qty 30, 30d supply, fill #0

## 2020-07-07 MED ORDER — PENTIPS 32G X 4 MM MISC
1 refills | Status: DC
  Filled 2020-07-07: qty 100, 30d supply, fill #0

## 2020-07-07 MED ORDER — ASPIRIN 81 MG PO TBEC: 81.0000 mg | DELAYED_RELEASE_TABLET | Freq: Every day | ORAL | 11 refills | Status: DC

## 2020-07-07 MED ORDER — ASPIRIN 81 MG PO TBEC
DELAYED_RELEASE_TABLET | ORAL | 0 refills | Status: DC
  Filled 2020-07-07: qty 8, 8d supply, fill #0

## 2020-07-07 MED ORDER — TICAGRELOR 90 MG PO TABS
90.0000 mg | ORAL_TABLET | Freq: Two times a day (BID) | ORAL | 0 refills | Status: DC
  Filled 2020-07-07: qty 60, 30d supply, fill #0

## 2020-07-07 NOTE — Progress Notes (Signed)
PROGRESS NOTE   Subjective/Complaints: Tobi Bastos PT told me patient was tearful this morning. Discussed with him that he is progressing well and will continue to make progress with his speech when he does home. Commended him on his dietary changes and ability to get off his insulin- CBGs have been well controlled  ROS: Limited due to expressive aphasia   Objective:   No results found. No results for input(s): WBC, HGB, HCT, PLT in the last 72 hours.  Recent Labs    07/06/20 0457 07/07/20 0518  NA 140 138  K 3.4* 3.3*  CL 102 104  CO2 23 26  GLUCOSE 90 103*  BUN 14 18  CREATININE 1.28* 1.38*  CALCIUM 9.5 9.0    Intake/Output Summary (Last 24 hours) at 07/07/2020 1200 Last data filed at 07/07/2020 0755 Gross per 24 hour  Intake 417 ml  Output --  Net 417 ml        Physical Exam: Vital Signs Blood pressure 112/72, pulse 75, temperature 97.9 F (36.6 C), temperature source Oral, resp. rate 18, height 6\' 1"  (1.854 m), weight 104.7 kg, SpO2 99 %. Gen: no distress, normal appearing HEENT: oral mucosa pink and moist, NCAT Cardio: Reg rate Chest: normal effort, normal rate of breathing Abd: soft, non-distended Ext: no edema Psych: pleasant, normal affect Skin: intact Neurologic: Cranial nerves II through XII intact, motor strength is 5/5 in bilateral deltoid, bicep, tricep, grip. Standing with good balance Aphasic- not consistent with Y/N, does seem to comprehend most basic information Sensory exam unable to assess due to aphasia    Musculoskeletal: Full range of motion in all 4 extremities. No joint swelling   Assessment/Plan: 1. Functional deficits which require 3+ hours per day of interdisciplinary therapy in a comprehensive inpatient rehab setting. Physiatrist is providing close team supervision and 24 hour management of active medical problems listed below. Physiatrist and rehab team continue to assess barriers  to discharge/monitor patient progress toward functional and medical goals  Care Tool:  Bathing    Body parts bathed by patient: Right arm, Left arm, Right upper leg, Right lower leg, Left upper leg, Face, Chest, Front perineal area, Buttocks, Abdomen, Left lower leg   Body parts bathed by helper: Right arm, Left arm, Front perineal area, Buttocks     Bathing assist Assist Level: Supervision/Verbal cueing     Upper Body Dressing/Undressing Upper body dressing   What is the patient wearing?: Pull over shirt    Upper body assist Assist Level: Set up assist    Lower Body Dressing/Undressing Lower body dressing      What is the patient wearing?: Underwear/pull up, Pants     Lower body assist Assist for lower body dressing: Set up assist     Toileting Toileting    Toileting assist Assist for toileting: Supervision/Verbal cueing     Transfers Chair/bed transfer  Transfers assist     Chair/bed transfer assist level: Independent with assistive device     Locomotion Ambulation   Ambulation assist      Assist level: Supervision/Verbal cueing Assistive device: No Device Max distance: 120ft   Walk 10 feet activity   Assist     Assist  level: Supervision/Verbal cueing Assistive device: No Device   Walk 50 feet activity   Assist    Assist level: Supervision/Verbal cueing Assistive device: No Device    Walk 150 feet activity   Assist    Assist level: Supervision/Verbal cueing Assistive device: No Device    Walk 10 feet on uneven surface  activity   Assist     Assist level: Supervision/Verbal cueing Assistive device: Other (comment) (none)   Wheelchair     Assist Will patient use wheelchair at discharge?: No   Wheelchair activity did not occur: N/A         Wheelchair 50 feet with 2 turns activity    Assist    Wheelchair 50 feet with 2 turns activity did not occur: N/A       Wheelchair 150 feet activity      Assist  Wheelchair 150 feet activity did not occur: N/A       Blood pressure 112/72, pulse 75, temperature 97.9 F (36.6 C), temperature source Oral, resp. rate 18, height 6\' 1"  (1.854 m), weight 104.7 kg, SpO2 99 %.    Medical Problem List and Plan: 1.  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  Impaired mobility -DVT/anticoagulation: D/c Lovenox since ambulating >500 feet             -antiplatelet therapy: Continue Aspirin 81 mg daily and Brilinta 90 mg twice daily x30 days then aspirin 325 mg daily and Plavix 75 mg daily for another 2 months and then Plavix alone 3. Pain Management: Continue Tylenol as needed 4. Mood: Continue Prozac 20 mg daily             -antipsychotic agents: N/A 5. Neuropsych: This patient is not capable of making decisions on his own behalf. 6. Skin/Wound Care: Routine skin checks 7. Fluids/Electrolytes/Nutrition:   Encourage po intake  8.  Diabetes mellitus.  Hemoglobin A1c 9.9. Check blood sugars before meals and at bedtime.  Diabetic teaching  CBG (last 3)  Recent Labs    07/06/20 2101 07/07/20 0554 07/07/20 1151  GLUCAP 113* 111* 103*    -6/22: stable: d/c ISS. D/c levemir 9.  Seizure prophylaxis.  Completed course of Keppra 1000 mg twice daily, discontinue 10.  Hypertension.  Elevated. Increase Norvasc to 10mg  daily on 6/9, HCTZ 12.5 mg daily.  orthostasis- con't regimen 11.  Hyperlipidemia.  Lipitor 12.  History of tobacco use.  Counseling 13. Hypokalemia: continue supplementation and repeat tomorrow.  14. AKI: Placed nursing order to encourage 6-8 glasses of water per day, rising again, 250cc bolus today and repeat tomorrow 15. Constipation: resolved 16. Vitamin D deficiency: Continue Ergocalciferol 50,000U weekly.  17. Diarrhea: Resolved, d/c imoidum 18. Tachycardia:   toprol-XL 12.5mg  started. Resolved. Continue to monitor HR TID 19. Hypomagnesemia: normalized with supplementation 16. Disposition: HFU scheduled with Dr. on 8/9. D/c 6/23. Discharging home with his girfriend.   LOS: 14 days A FACE TO FACE EVALUATION WAS PERFORMED  [id number] 07/07/2020,

## 2020-07-07 NOTE — Progress Notes (Addendum)
Occupational Therapy Session Note  Patient Details  Name: Nathaniel Spears MRN: [id number] Date of Birth: 04/24/1975  Today's Date: 07/07/2020 OT Individual Time: 1420-1500 OT Individual Time Calculation (min): 40 min    Short Term Goals: Week 1:  OT Short Term Goal 1 (Week 1): patient will complete functional transfers and bed mobility with CS OT Short Term Goal 1 - Progress (Week 1): Not met OT Short Term Goal 2 (Week 1): patient will complete adl tasks/grooming/bathing with min A and min cues for initiation, sequencing OT Short Term Goal 2 - Progress (Week 1): Not met OT Short Term Goal 3 (Week 1): patient will complete basic meal prep 2-3 step task with CS and mod cues OT Short Term Goal 3 - Progress (Week 1): Not met OT Short Term Goal 4 (Week 1): patient will scan right side of environment with min cues OT Short Term Goal 4 - Progress (Week 1): Not met Week 2:  OT Short Term Goal 1 (Week 2): Patient will complete functional transfers with close spvsn consistently. OT Short Term Goal 2 (Week 2): Patient will complete ADL task with min A and no more than min cues for initiation and sequencing. OT Short Term Goal 3 (Week 2): Patient will complete simple meal prep with close spvsn and mod cues. OT Short Term Goal 4 (Week 2): Patient will complete ADL task with no more than min cues to scan R task environment. OT Short Term Goal 5 (Week 2): Patient will complete functional ambulation during ADL task with no more than 1-2 cues for safe DME use.  Skilled Therapeutic Interventions/Progress Updates:    Pt received in room with significant other present and consented to OT tx. Pt issued new clean socks and was able to take off old socks, put on new socks with setup. Pt wheeled in w/c for time to go outside, instructed in 4# dowel rod exercises including shoulder flexion and chest press for 1x15 to increase strength for ADLs, also instructed pt to count with therapist out loud to practice word  finding. Pt req mod cuing for instruction and technique with fair carryover. Pt then wheeled into therapy gym, instructed in card matching tasks for improved cogitive skills. Pt able to match numbered cards more easily than ace or face cards, but continued to require moderate cuing to visually scan to right side to see all of cards. After tx, pt helped back to room back to bed and left with alarm on and with all needs met, CM and SO present.   Therapy Documentation Precautions:  Precautions Precautions: Fall Precaution Comments: expressive aphasia Restrictions Weight Bearing Restrictions: No    Pain: Pain Assessment Pain Scale: 0-10 Pain Score: 0-No pain   Therapy/Group: Individual Therapy  Amber  Adams 07/07/2020, 1:15 PM 

## 2020-07-07 NOTE — Progress Notes (Signed)
Speech Language Pathology Discharge Summary  Patient Details  Name: Nathaniel Spears MRN: [id number] Date of Birth: [birth year]  Today's Date: 07/07/2020 SLP Individual Time: 0220-2669 SLP Individual Time Calculation (min): 28 min  Patient has met 2 of 4 long term goals.  Patient to discharge at overall Mod level for communication of basic wants/needs. Pt receptive language has improved to goal of min impairment, however expressive language remains with mod-max impairment at this time. Pt expresses frustration with difficulty communicating, benefits from phonemic, semantic, phrase completion and written cues to increase communicative message. Pt girlfriend is very supportive and able to utilize strategies as trained to aid in patient's communication of wants/needs.   Reasons goals not met: continued severe expressive aphasia  Care Partner:  Caregiver Able to Provide Assistance: Yes  Type of Caregiver Assistance: Physical;Cognitive  Recommendation:  24 hour supervision/assistance;Outpatient SLP  Rationale for SLP Follow Up: Maximize functional communication;Reduce caregiver burden   Equipment: no equipment provided  Reasons for discharge: Discharged from hospital   Patient/Family Agrees with Progress Made and Goals Achieved: Yes    Skilled Therapeutic Interventions:  Pt seen for skilled ST with focus on expressive aphasia goals and caregiver education. Significant other present throughout session, educated her and patient on strategies to increase communication of basic wants/needs at home including basic communication flash cards to indicate pain, thirst, hunger, etc. Pt continues to report expressive language is poor in the morning but improves as day continues. Encouraged to immerse himself in language at home through reading, writing, and social speech. Educated pt and SO on cues to utilize to promote expression (I.e. phrase completion, semantic/phonemic cues). SO expressed understanding  and good teach back. Recommend continued ST services in OP setting at discharge. Pt left in bed with alarm set and all needs met.    Nathaniel Spears 07/07/2020, 2:24 PM

## 2020-07-07 NOTE — Progress Notes (Signed)
Physical Therapy Discharge Summary  Patient Details  Name: Nathaniel Spears MRN: [id number] Date of Birth: 10/27/1975  Today's Date: 07/07/2020 PT Individual Time: 0800-0853 PT Individual Time Calculation (min): 53 min   Patient has met 9 of 9 long term goals due to improved activity tolerance, improved balance, improved postural control, ability to compensate for deficits, improved attention, improved awareness, and improved coordination. Patient to discharge at an ambulatory level Supervision. [name] care partner is independent to provide the necessary physical and cognitive assistance at discharge. [name] girlfriend did not attend formal family education training but has been present for a few therapy sessions and was cleared to assist pt to/from bathroom during pt's first week in rehab.   All goals met   Recommendation:  Patient will benefit from ongoing skilled PT services in outpatient setting to continue to advance safe functional mobility, address ongoing impairments in transfers, generalized strengthening, dynamic standing balance/coordination, gait training, NMR, endurance, and to minimize fall risk.  Equipment: No equipment provided  Reasons for discharge: treatment goals met  Patient/family agrees with progress made and goals achieved: Yes  Today's Interventions: Received pt sitting in bed, pt agreeable to PT treatment, and denied any pain during session. Session with emphasis on discharge planning, functional mobility/transfers, generalized strengthening, dynamic standing balance/coordination, ambulation, simulated car transfers, stair navigation, NMR, and improved activity tolerance. Performed bed mobility independently and donned shoes sitting EOB with set up assist. Sit<>stand without AD mod I and ambulated 11f x 1 and 1247fx 1 without AD and supervision to 4W therapy gym with cues for turning L vs R. Pt navigated 12 steps with 2 rails and supervision ascending and descending  with a step to pattern with no LOB noted. Pt ambulated additional [id number] AD and supervision to ortho gym and performed ambulatory simulated car transfer without AD and supervision and ambulated [id number] uneven surfaces (ramp and mulch) and descended 1 6in curb with 1UE support and supervision. Pt stood and picked up small lego from floor without AD and supervision with no LOB noted.  Worked on dynamic standing balance, visual scanning, and motor planning/sequencing picking up clothespins based on therapist's cue for color and placing them on/taking them off clothesline. Pt able to place red, green, yellow clothespins on clothesline without cues but required mod cues for sequencing and R attention when taking them off. Discussed concerns for D/C and pt stated "I'm scared" and became tearful. Provided emotional support and encouragement and educated pt on healing timeline post CVA; MD also made aware. Pt ambulated [id number] 150ft 48fwithout AD and supervision back to room. Concluded session with pt sitting in recliner, needs within reach, and seatbelt alarm on. Provided pt with fresh drink.   PT Discharge Precautions/Restrictions Precautions Precautions: Fall Precaution Comments: expressive aphasia Restrictions Weight Bearing Restrictions: No Cognition Overall Cognitive Status: Difficult to assess (difficult to assess due to expressive aphasia) Arousal/Alertness: Awake/alert Orientation Level: Oriented to person;Oriented to place;Other (comment) (difficult to assess due to expressive aphasia) Memory: Impaired Awareness: Impaired Problem Solving: Impaired Safety/Judgment: Impaired Sensation Sensation Light Touch: Appears Intact Proprioception: Appears Intact Additional Comments: difficulty determining L vs R and specifying location due to expressive aphasia Coordination Gross Motor Movements are Fluid and Coordinated: No Fine Motor Movements are Fluid and Coordinated: No Coordination  and Movement Description: grossly uncoordinated due to hemiparesis, mild R inattention, apraxia, decreased motor planning/sequencing, and expressive aphasia Finger Nose Finger Test: difficulty with sequencing of directions for task Heel Shin Test: decreased ROM  bilaterally; difficulty follopwing instructions for task Motor  Motor Motor: Hemiplegia;Motor perseverations;Motor apraxia Motor - Skilled Clinical Observations: grossly uncoordinated due to hemiparesis, mild R inattention, apraxia, decreased motor planning/sequencing, and expressive aphasia  Mobility Bed Mobility Bed Mobility: Rolling Right;Rolling Left;Sit to Supine;Supine to Sit Rolling Right: Independent Rolling Left: Independent Supine to Sit: Independent Sit to Supine: Independent Transfers Transfers: Sit to Stand;Stand to Sit;Stand Pivot Transfers Sit to Stand: Independent with assistive device Stand to Sit: Independent with assistive device Stand Pivot Transfers: Independent with assistive device Transfer (Assistive device): None Locomotion  Gait Ambulation: Yes Gait Assistance: Supervision/Verbal cueing Gait Distance (Feet): 150 Feet Assistive device: None Gait Assistance Details: Verbal cues for technique;Verbal cues for precautions/safety Gait Assistance Details: verbal cues to increase cadence to improve arm swing and cues for direction/sequencing Gait Gait: Yes Gait Pattern: Impaired Gait Pattern: Step-through pattern;Wide base of support;Decreased stride length;Poor foot clearance - right;Poor foot clearance - left;Decreased trunk rotation Gait velocity: decreased Stairs / Additional Locomotion Stairs: Yes Stairs Assistance: Supervision/Verbal cueing Stair Management Technique: Two rails Number of Stairs: 12 Height of Stairs: 6 Ramp: Supervision/Verbal cueing (no AD) Curb: Supervision/Verbal cueing (1 UE support) Wheelchair Mobility Wheelchair Mobility: No  Trunk/Postural Assessment  Cervical  Assessment Cervical Assessment: Within Functional Limits Thoracic Assessment Thoracic Assessment: Within Functional Limits Lumbar Assessment Lumbar Assessment: Within Functional Limits Postural Control Postural Control: Within Functional Limits  Balance Balance Balance Assessed: Yes Static Sitting Balance Static Sitting - Balance Support: Feet supported;No upper extremity supported Static Sitting - Level of Assistance: 7: Independent Dynamic Sitting Balance Dynamic Sitting - Balance Support: Feet supported;No upper extremity supported Dynamic Sitting - Level of Assistance: 6: Modified independent (Device/Increase time) Static Standing Balance Static Standing - Balance Support: No upper extremity supported Static Standing - Level of Assistance: 6: Modified independent (Device/Increase time) Dynamic Standing Balance Dynamic Standing - Balance Support: No upper extremity supported Dynamic Standing - Level of Assistance: 6: Modified independent (Device/Increase time) Dynamic Standing - Comments: with transfers Extremity Assessment  RLE Assessment RLE Assessment: Within Functional Limits General Strength Comments: mild difficulty with sequencing LLE Assessment LLE Assessment: Within Functional Limits General Strength Comments: mild difficulty with sequencing   Alfonse Alpers PT, DPT  07/07/2020, 7:43 AM

## 2020-07-07 NOTE — Progress Notes (Signed)
Patient ID: Nathaniel Spears, male   DOB: Jun 16, 1975, 45 y.o.   MRN: [id number]  Patient MATCH entered into system:   [id number]  Effective: 6/22 Term: 6/29

## 2020-07-07 NOTE — Discharge Summary (Signed)
Occupational Therapy Discharge Summary  Patient Details  Name: Nathaniel Spears MRN: [id number] Date of Birth: 1975/07/31     Patient has met 11 of 13 long term goals due to improved activity tolerance, improved balance, and improved awareness.  Patient to discharge at overall Supervision level.  [name] care partner is independent to provide the necessary cognitive assistance at discharge. Pt has made excellent progress and no longer requires physical assistance for ADLs and functional transfers, however needs cuing for task initiation, proper completion, and safety.   Reasons goals not met: Pt requires supervision for visual scanning activities and grooming/oral care tasks as pt requires cuing for R side attention as well as initiation of grooming tasks.    Recommendation:  Patient will benefit from ongoing skilled OT services in home health setting to continue to advance functional skills in the area of BADL and iADL.  Equipment: No equipment provided  Reasons for discharge: treatment goals met and discharge from hospital  Patient/family agrees with progress made and goals achieved: Yes  OT Discharge Precautions/Restrictions  Precautions Precautions: Fall Precaution Comments: expressive aphasia Restrictions Weight Bearing Restrictions: No  Vital Signs Therapy Vitals Temp: 97.9 F (36.6 C) Temp Source: Oral Pulse Rate: 75 Resp: 18 BP: 112/72 Patient Position (if appropriate): Lying Oxygen Therapy SpO2: 99 % O2 Device: Room Air Pain Pain Assessment Pain Scale: 0-10 Pain Score: 0-No pain ADL ADL Eating: Set up Where Assessed-Eating: Chair Grooming: Supervision/safety Where Assessed-Grooming: Standing at sink Upper Body Bathing: Supervision/safety, Maximal cueing Where Assessed-Upper Body Bathing: Shower Lower Body Bathing: Supervision/safety, Maximal cueing Where Assessed-Lower Body Bathing: Shower Upper Body Dressing: Setup Where Assessed-Upper Body Dressing:  Edge of bed Lower Body Dressing: Setup Where Assessed-Lower Body Dressing: Edge of bed Toileting: Supervision/safety, Moderate cueing Toilet Transfer: Close supervision, Minimal verbal cueing Toilet Transfer Method: Counselling psychologist: Geophysical data processor: Close supervision, Maximal cueing Social research officer, government Method: Heritage manager: Shower seat with back ADL Comments: patient with impaired initiation, sequencing and perseveration Vision Baseline Vision/History: No visual deficits Patient Visual Report: Other (comment) (unable to formally assess 2/2 aphasia, however R side inattention noted during funtional tasks.) Vision Assessment?: Yes (unable to formally assess 2/2 aphasia, however R side inattention noted during funtional tasks.) Eye Alignment: Within Functional Limits Ocular Range of Motion: Within Functional Limits Alignment/Gaze Preference: Within Defined Limits Tracking/Visual Pursuits: Able to track stimulus in all quads without difficulty Convergence: Within functional limits Visual Fields: Right visual field deficit Perception  Inattention/Neglect: Does not attend to right visual field Praxis Praxis: Impaired Praxis Impairment Details: Initiation;Motor planning;Perseveration Cognition Overall Cognitive Status: Difficult to assess Arousal/Alertness: Awake/alert Orientation Level: Oriented to person;Oriented to place;Oriented to situation Memory: Impaired Awareness: Impaired Problem Solving: Impaired Executive Function: Sequencing;Initiating;Organizing Sequencing: Impaired Organizing: Impaired Initiating: Impaired Safety/Judgment: Impaired Comments: no impulsive behaviors, pleasant and cooperative. becomes frustrated with himself when he can't express himself/say the words he is trying to say Sensation Sensation Light Touch: Appears Intact Hot/Cold: Appears Intact Proprioception: Appears  Intact Additional Comments: difficulty determining L vs R and specifying location due to expressive aphasia Coordination Gross Motor Movements are Fluid and Coordinated: No Fine Motor Movements are Fluid and Coordinated: No Coordination and Movement Description: grossly uncoordinated due to hemiparesis, mild R inattention, apraxia, decreased motor planning/sequencing, and expressive aphasia Finger Nose Finger Test: difficulty with sequencing of directions for task Motor  Motor Motor: Hemiplegia;Motor perseverations;Motor apraxia Motor - Skilled Clinical Observations: grossly uncoordinated due to hemiparesis, mild R inattention, apraxia, decreased motor planning/sequencing,  and expressive aphasia Mobility  Bed Mobility Bed Mobility: Rolling Right;Rolling Left;Sit to Supine;Supine to Sit Rolling Right: Independent Rolling Left: Independent Supine to Sit: Independent Sit to Supine: Independent Transfers Sit to Stand: Independent with assistive device Stand to Sit: Independent with assistive device  Trunk/Postural Assessment  Cervical Assessment Cervical Assessment: Within Functional Limits Thoracic Assessment Thoracic Assessment: Within Functional Limits Lumbar Assessment Lumbar Assessment: Within Functional Limits Postural Control Postural Control: Within Functional Limits  Balance Balance Balance Assessed: Yes Static Sitting Balance Static Sitting - Balance Support: Feet supported;No upper extremity supported Static Sitting - Level of Assistance: 7: Independent Dynamic Sitting Balance Dynamic Sitting - Balance Support: Feet supported;No upper extremity supported Dynamic Sitting - Level of Assistance: 6: Modified independent (Device/Increase time) Static Standing Balance Static Standing - Balance Support: No upper extremity supported Static Standing - Level of Assistance: 6: Modified independent (Device/Increase time) Dynamic Standing Balance Dynamic Standing - Balance  Support: No upper extremity supported Dynamic Standing - Level of Assistance: 6: Modified independent (Device/Increase time) Extremity/Trunk Assessment RUE Assessment RUE Assessment: Within Functional Limits LUE Assessment LUE Assessment: Within Functional Limits   [employer] 07/07/2020, 7:42 AM

## 2020-07-07 NOTE — Progress Notes (Signed)
Occupational Therapy Session Note  Patient Details  Name: Nathaniel Spears MRN: [id number] Date of Birth: 1975-07-22  Today's Date: 07/07/2020 OT Individual Time: 1002-1100 OT Individual Time Calculation (min): 58 min    Short Term Goals: Week 1:  OT Short Term Goal 1 (Week 1): patient will complete functional transfers and bed mobility with CS OT Short Term Goal 1 - Progress (Week 1): Not met OT Short Term Goal 2 (Week 1): patient will complete adl tasks/grooming/bathing with min A and min cues for initiation, sequencing OT Short Term Goal 2 - Progress (Week 1): Not met OT Short Term Goal 3 (Week 1): patient will complete basic meal prep 2-3 step task with CS and mod cues OT Short Term Goal 3 - Progress (Week 1): Not met OT Short Term Goal 4 (Week 1): patient will scan right side of environment with min cues OT Short Term Goal 4 - Progress (Week 1): Not met Week 2:  OT Short Term Goal 1 (Week 2): Patient will complete functional transfers with close spvsn consistently. OT Short Term Goal 2 (Week 2): Patient will complete ADL task with min A and no more than min cues for initiation and sequencing. OT Short Term Goal 3 (Week 2): Patient will complete simple meal prep with close spvsn and mod cues. OT Short Term Goal 4 (Week 2): Patient will complete ADL task with no more than min cues to scan R task environment. OT Short Term Goal 5 (Week 2): Patient will complete functional ambulation during ADL task with no more than 1-2 cues for safe DME use.  Skilled Therapeutic Interventions/Progress Updates:     Pt received in room and consented to OT tx. Pt walked down to ADL apartment with close SUP and no device. Pt instructed in tub bench transfer and was able to complete with SUP and cues. Pt then taken down to gym, however it was very loud, brought to day room as it was a much quieter environment for cognitive skills activity. Pt instructed in sorting task with Franki Monte game, instructed to only sort  by colors and focused on expressing/saying each color before he placed it. Pt req max cues for  proper placement during sorting, was able to repeat the names but unable to come up with them on his own, said "purple" for most colors, even though he knew it wasn't right. Educated pt on expressive aphasia, told him that therapist knows he knows the right color, that he is just having trouble staying it, which seemed to calm pt's frustrations. Spent extensive time with color sorting and word finding activity. Pt then taken to therapy gym, instructed to transport water cup and throw it away in gym with SUP. Once pt came upon an obstacle in the doorway, he seemed to freeze. Second person got pt a chair to sit down in, and then pt was able to stand and walk back to room. Will practice obstacle navigation during next session. After tx, pt left sitting EOB with alarm on and all needs met.   Therapy Documentation Precautions:  Precautions Precautions: Fall Precaution Comments: expressive aphasia Restrictions Weight Bearing Restrictions: No  Vital Signs: Therapy Vitals Temp: 97.9 F (36.6 C) Temp Source: Oral Pulse Rate: 75 Resp: 18 BP: 112/72 Patient Position (if appropriate): Lying Oxygen Therapy SpO2: 99 % O2 Device: Room Air Pain: none      Therapy/Group: Individual Therapy  Efe Fazzino 07/07/2020, 7:40 AM

## 2020-07-08 ENCOUNTER — Other Ambulatory Visit: Payer: Self-pay

## 2020-07-08 ENCOUNTER — Other Ambulatory Visit (HOSPITAL_COMMUNITY): Payer: Self-pay

## 2020-07-08 LAB — BASIC METABOLIC PANEL
Anion gap: 10 (ref 5–15)
BUN: 16 mg/dL (ref 6–20)
CO2: 26 mmol/L (ref 22–32)
Calcium: 9.3 mg/dL (ref 8.9–10.3)
Chloride: 103 mmol/L (ref 98–111)
Creatinine, Ser: 1.33 mg/dL — ABNORMAL HIGH (ref 0.61–1.24)
GFR, Estimated: >60 mL/min (ref >60)
Glucose, Bld: 120 mg/dL — ABNORMAL HIGH (ref 70–99)
Potassium: 3.5 mmol/L (ref 3.5–5.1)
Sodium: 139 mmol/L (ref 135–145)

## 2020-07-08 LAB — GLUCOSE, CAPILLARY: Glucose-Capillary: 96 mg/dL (ref 70–99)

## 2020-07-08 NOTE — Progress Notes (Signed)
Patient discharged home with family at 10:30 this morning. Information packet given to patient per PA, no questions noted. Taken down via wheelchair. Vic Ripper

## 2020-07-08 NOTE — Progress Notes (Signed)
Inpatient Rehabilitation Care Coordinator Discharge Note  The overall goal for the admission was met for:   Discharge location: Yes, home  Length of Stay: Yes, 15 Days  Discharge activity level: Yes, Supervision  Home/community participation: Yes  Services provided included: MD, RD, PT, OT, SLP, RN, CM, TR, Pharmacy, Neuropsych, and SW  Financial Services: Private Insurance: uninsured  Choices offered to/list presented to:n/a  Follow-up services arranged: Other: HEP  Comments (or additional information):  Patient/Family verbalized understanding of follow-up arrangements: Yes  Individual responsible for coordination of the follow-up plan: Ellison Hughs [phone]  Confirmed correct DME delivered: Dyanne Iha 07/08/2020    Dyanne Iha

## 2020-07-09 ENCOUNTER — Telehealth: Payer: Self-pay

## 2020-07-09 NOTE — Telephone Encounter (Signed)
Transition Care Management Unsuccessful Follow-up Telephone Call  Date of discharge and from where:  Nathaniel Spe[facility] on 07/08/2020  Attempts:  1st Attempt  Reason for unsuccessful TCM follow-up call:  Unable to leave message no option at this time.   Pt have scheduled appointment on 08/10/2020 with NP Gwinda Passe.

## 2020-07-12 ENCOUNTER — Telehealth: Payer: Self-pay

## 2020-07-12 NOTE — Telephone Encounter (Signed)
Transition Care Management Unsuccessful Follow-up Telephone Call  Date of discharge and from where:  07/08/2020, [facility]   Attempts:  2nd Attempt  Reason for unsuccessful TCM follow-up call:  Unable to reach patient - call placed to # [phone] twice and both times the recoding stated that the call could not be completed at this time

## 2020-07-13 ENCOUNTER — Telehealth: Payer: Self-pay

## 2020-07-13 NOTE — Telephone Encounter (Signed)
Transition Care Management Unsuccessful Follow-up Telephone Call  Date of discharge and from where:  07/08/2020, [facility]  Attempts:  3rd Attempt  Reason for unsuccessful TCM follow-up call:  Unable to reach patient # [phone] - the recording stated that the call could not be completed at this time.   Patient has appointment with Gwinda Passe, NP 08/10/2020.

## 2020-07-21 ENCOUNTER — Telehealth (HOSPITAL_COMMUNITY): Payer: Self-pay | Admitting: Pharmacist

## 2020-07-21 NOTE — Telephone Encounter (Signed)
Unable to reach patient, will attempt again at a later date. 

## 2020-07-22 ENCOUNTER — Ambulatory Visit: Payer: Medicaid Other | Attending: Primary Care | Admitting: Physical Therapy

## 2020-07-22 ENCOUNTER — Ambulatory Visit: Payer: Medicaid Other

## 2020-07-22 ENCOUNTER — Encounter: Payer: Self-pay | Admitting: Physical Therapy

## 2020-07-22 ENCOUNTER — Other Ambulatory Visit: Payer: Self-pay

## 2020-07-22 DIAGNOSIS — R2681 Unsteadiness on feet: Secondary | ICD-10-CM | POA: Insufficient documentation

## 2020-07-22 DIAGNOSIS — R278 Other lack of coordination: Secondary | ICD-10-CM | POA: Insufficient documentation

## 2020-07-22 DIAGNOSIS — M6281 Muscle weakness (generalized): Secondary | ICD-10-CM | POA: Diagnosis present

## 2020-07-22 DIAGNOSIS — I69353 Hemiplegia and hemiparesis following cerebral infarction affecting right non-dominant side: Secondary | ICD-10-CM | POA: Diagnosis present

## 2020-07-22 DIAGNOSIS — R482 Apraxia: Secondary | ICD-10-CM | POA: Diagnosis present

## 2020-07-22 DIAGNOSIS — R4701 Aphasia: Secondary | ICD-10-CM | POA: Diagnosis present

## 2020-07-22 DIAGNOSIS — R41842 Visuospatial deficit: Secondary | ICD-10-CM | POA: Insufficient documentation

## 2020-07-22 DIAGNOSIS — R41840 Attention and concentration deficit: Secondary | ICD-10-CM | POA: Diagnosis present

## 2020-07-22 DIAGNOSIS — R2689 Other abnormalities of gait and mobility: Secondary | ICD-10-CM | POA: Diagnosis not present

## 2020-07-22 NOTE — Therapy (Signed)
[facility] [address] [place], [place], [address] Phone: [phone]   Fax:  [phone]  Speech Language Pathology Evaluation  Patient Details  Name: Nathaniel Spears MRN: [id number] Date of Birth: 05/15/1975 Referring Provider (SLP): Charlton Amor, PA-C   Encounter Date: 07/22/2020   End of Session - 07/22/20 1447     Visit Number 1    Number of Visits 25    Date for SLP Re-Evaluation 10/14/20    Authorization Type self-pay    SLP Start Time 1233    SLP Stop Time  1315    SLP Time Calculation (min) 42 min    Activity Tolerance Patient tolerated treatment well             Past Medical History:  Diagnosis Date   Hypertension    Stroke [facility])     Past Surgical History:  Procedure Laterality Date   BUBBLE STUDY  06/01/2020   Procedure: BUBBLE STUDY;  Surgeon: Little Ishikawa, MD;  Location: [facility] ENDOSCOPY;  Service: Cardiovascular;;   IR ANGIO INTRA EXTRACRAN SEL COM CAROTID INNOMINATE BILAT MOD SED  06/02/2020   IR ANGIO VERTEBRAL SEL SUBCLAVIAN INNOMINATE BILAT MOD SED  06/02/2020   TEE WITHOUT CARDIOVERSION N/A 06/01/2020   Procedure: TRANSESOPHAGEAL ECHOCARDIOGRAM (TEE);  Surgeon: Little Ishikawa, MD;  Location: [facility] ENDOSCOPY;  Service: Cardiovascular;  Laterality: N/A;    There were no vitals filed for this visit.       SLP Evaluation [facility] - 07/22/20 1233       SLP Visit Information   SLP Received On 07/06/20    Referring Provider (SLP) Charlton Amor, PA-C    Onset Date 06-15-20    Medical Diagnosis CVA      Subjective   Patient/Family Stated Goal Get back to talking in full sentences so we can communicate again      Pain Assessment   Currently in Pain? No/denies      General Information   HPI Nathaniel Spears is a 45 year old right-handed male with history of hyperlipidemia, hypertension, diabetes mellitus, tobacco use. Per chart review lives with spouse.  Two-level home 6  steps to entry.  Prior to initial CVA of April 2022 patient independent in all tasks. 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  He had received work-up at [facility] per neurology.  Current work-up was negative for vasculitis.  He returned to [facility] 06/15/2020 with word finding deficits.  MRI of the brain showed several new or increased acute infarcts in both cerebral hemispheres.  CTA of the head/ neck showed no large vessel occlusion.  Unchanged multifocal severe intracranial stenosis.  EEG was negative for seizure but did suggest cortical dysfunction in the right hemisphere secondary to underlying stroke.  It also showed diffuse encephalopathy nonspecific in etiology.      Balance Screen   Has the patient fallen in the past 6 months No    Has the patient had a decrease in activity level because of a fear of falling?  No    Is the patient reluctant to leave their home because of a fear of falling?  No      Prior Functional Status   Cognitive/Linguistic Baseline Within functional limits    Type of Home Apartment     Lives With Significant other    Available Support Family    Education college    Vocation Unemployed      Cognition  Overall Cognitive Status Difficult to assess      Auditory Comprehension   Overall Auditory Comprehension Impaired    Yes/No Questions Impaired    Basic Biographical Questions 51-75% accurate    Complex Questions 50-74% accurate    Paragraph Comprehension (via yes/no questions) Not tested    Conversation Moderately complex    Interfering Components Working memory;Processing speed;Motor planning    EffectiveTechniques Extra processing time;Repetition;Slowed speech      Expression   Primary Mode of Expression Verbal      Verbal Expression   Overall Verbal Expression Impaired    Initiation Impaired    Automatic Speech Social Response;Day of week;Month of year;Counting    Level of  Generative/Spontaneous Verbalization Phrase    Repetition Impaired    Level of Impairment Sentence level    Naming Impairment    Responsive 26-50% accurate    Confrontation 50-74% accurate    Convergent Not tested    Divergent Not tested    Verbal Errors Neologisms;Phonemic paraphasias;Perseveration    Pragmatics No impairment    Interfering Components Premorbid deficit    Effective Techniques Phonemic cues;Semantic cues;Sentence completion    Non-Verbal Means of Communication Writing;Gestures      Written Expression   Dominant Hand Left    Written Expression Exceptions to [facility]    Dictation Ability Word    Overall Writen Expression pt able to write basic biographical information (name, birthdate). Pt unable to write single words when prompted      Oral Motor/Sensory Function   Overall Oral Motor/Sensory Function Impaired    Labial Coordination WFL    Lingual Symmetry Within Functional Limits    Lingual Coordination Reduced    Facial ROM Within Functional Limits      Motor Speech   Overall Motor Speech Impaired    Motor Planning Impaired    Level of Impairment Word    Motor Speech Errors Inconsistent      Standardized Assessments   Standardized Assessments  Other Assessment   Quick Aphasia Battery= 6.15 (mod)                            SLP Education - 07/22/20 1447     Education Details eval results, possible goals, HEP    Person(s) Educated Patient;Caregiver(s)    Methods Explanation;Demonstration;Handout    Comprehension Verbalized understanding;Returned demonstration;Need further instruction              SLP Short Term Goals - 07/22/20 1500       SLP SHORT TERM GOAL #1   Title Pt will name 5-10 words in personally relevant categories with occasional mod A over 2 sessions    Time 4    Period Weeks    Status New      SLP SHORT TERM GOAL #2   Title Pt will read sentences with ability to correct errors with 80% accuracy given occasional mod  A over 2 sessions    Time 4    Period Weeks    Status New      SLP SHORT TERM GOAL #3   Title Pt will use external visual communication aids to augment verbal expression for communication of wants/needs for 4/5 opportunities with occasional min A over 2 sessions    Time 4    Period Weeks    Status New      SLP SHORT TERM GOAL #4   Title Pt will complete picture description tasks by verbalizing at  least 2 words for 8/10 opportunities with occasional mod A over 2 sessions    Time 4    Period Weeks    Status New      SLP SHORT TERM GOAL #5   Title Pt will verbalize 2-3 word phrases in response to simple conversational dialogue with usual mod A over 2 sessions    Time 4    Period Weeks    Status New              SLP Long Term Goals - 07/22/20 1501       SLP LONG TERM GOAL #1   Title Pt will verbalize 3-4 word sentences in response to simple open ended questions with usual min A over 2 sessions    Time 12    Period Weeks    Status New      SLP LONG TERM GOAL #2   Title Pt will use mulitmodal communication system to augment verbal expression to meet needs at home with occasional min A from family over 2 sessions    Time 12    Period Weeks    Status New      SLP LONG TERM GOAL #3   Title Pt/caregiver will report improvements in frustration level and communication effectivness via QOL scale by 2 points at last ST session    Baseline CPB: 0    Time 12    Period Weeks    Status New              Plan - 07/22/20 1448     Clinical Impression Statement Nathaniel Spears presented for OPST evaluation to assess current communication s/p multiple strokes. Initial stroke happened in April 2022. Pt was participating in OPST treatment prior to hospitalization in early May. Most recent stroke occured end of May 2022, in which pt hospitalized from 06-15-20 to 07-08-20. Today, pt was accompanied by partner, Nathaniel Spears. Nathaniel Spears unable to provide majority of medical hx due to severe aphasia and  apraxia; therefore Nathaniel Spears detailed recent strokes and hospital stay. Nathaniel Spears reports current communication is similiar to last presentation to OPST intervention. Pt nodded in agreement. No changes in cognition or swallow function reported. Quick Aphasia Battery re-adminstered this session, with overall moderate aphasia indicated. Assessment indicates severe expressive aphasia, mild to moderate receptive aphasia, and mild apraxia. Pt exhibited 1-2 words answers in connected speech tasks, with usual fillers ("um") and empty speech noted. Pt exhibited decreased auditory comprehension for mod complex yes/no questions. Adequate picture naming exhibited this session, with pt able to self-correct paraphasia x1. Pt exhibited usual verbal perseverations during assessment of connected speech and reading at sentence level. Pt able to write simple biographical information (name and birthdate) but pt unable to spontanously write words or sentences. Nathaniel Spears is using "cue cards" at home to help pt select choices and continues to practice with HEP provided during last OPST intervention. Nathaniel Spears completed Communication Participation Item Bank, in which she indicated patient has "very much" difficulty with every communication scenario resulting in score of 0. Skilled ST intervention is warranted to address moderate to severe communication deficits impacting patient's ability to participate in simple conversations and communicate wants and needs effectively.    Speech Therapy Frequency 2x / week    Duration 12 weeks   POC written for 12 weeks to account for scheduling conflicts or missed visits, anticipate D/C from therapy in 8 weeks   Treatment/Interventions Compensatory strategies;Cueing hierarchy;Functional tasks;Patient/family education;Cognitive reorganization;Multimodal communcation approach;Environmental controls;Compensatory techniques;Internal/external aids;SLP instruction and feedback;Language facilitation  Potential  to Achieve Goals Fair    Potential Considerations Severity of impairments    SLP Home Exercise Plan provided    Consulted and Agree with Plan of Care Patient;Family member/caregiver    Family Member Consulted Nathaniel Spears             Patient will benefit from skilled therapeutic intervention in order to improve the following deficits and impairments:   Aphasia  Verbal apraxia    Problem List Patient Active Problem List   Diagnosis Date Noted   AKI (acute kidney injury) (HCC) 07/07/2020   Middle cerebral artery embolism, 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 [place], [place], [address] Phone: [phone]   Fax:  [phone]  Name: Akshith Moncus MRN: [id number] Date of Birth: 1975-07-28

## 2020-07-22 NOTE — Therapy (Signed)
[facility] [address] [place], [place], [address] Phone: [phone]   Fax:  [phone]  Physical Therapy Evaluation  Patient Details  Name: Nathaniel Spears MRN: [id number] Date of Birth: 07-Apr-1975 Referring Provider (PT): Raulkar (follow-up)   Encounter Date: 07/22/2020   PT End of Session - 07/22/20 1828     Visit Number 1    Number of Visits 17    Date for PT Re-Evaluation 09/17/20    Authorization Type Self-Pay at eval; Medicaid potential    PT Start Time 1323    PT Stop Time 1400    PT Time Calculation (min) 37 min    Activity Tolerance Patient tolerated treatment well    Behavior During Therapy [facility] for tasks assessed/performed             Past Medical History:  Diagnosis Date   Hypertension    Stroke [facility])     Past Surgical History:  Procedure Laterality Date   BUBBLE STUDY  06/01/2020   Procedure: BUBBLE STUDY;  Surgeon: Little Ishikawa, MD;  Location: [facility] ENDOSCOPY;  Service: Cardiovascular;;   IR ANGIO INTRA EXTRACRAN SEL COM CAROTID INNOMINATE BILAT MOD SED  06/02/2020   IR ANGIO VERTEBRAL SEL SUBCLAVIAN INNOMINATE BILAT MOD SED  06/02/2020   TEE WITHOUT CARDIOVERSION N/A 06/01/2020   Procedure: TRANSESOPHAGEAL ECHOCARDIOGRAM (TEE);  Surgeon: Little Ishikawa, MD;  Location: [facility] ENDOSCOPY;  Service: Cardiovascular;  Laterality: N/A;    There were no vitals filed for this visit.    Subjective Assessment - 07/22/20 1328     Subjective Pt has had multiple strokes, beginning in April 2022, discharged home late June.  No problems with steps or mobility.  No falls.  Prior to CVA, pt was independent and working.  They feel most concerned by difficulties with speech; feel like he has improved his walking since getting home from hospital.    Patient is accompained by: Family member   significant other, Nathaniel Spears   Patient Stated Goals Pt wants to become more independent.    Currently in  Pain? No/denies                [facility] PT Assessment - 07/22/20 1331       Assessment   Medical Diagnosis L MCA CVA    Referring Provider (PT) Raulkar (follow-up)    Onset Date/Surgical Date 04/20/20    Hand Dominance Left      Precautions   Precautions Fall    Precaution Comments Aphasia      Balance Screen   Has the patient fallen in the past 6 months No    Has the patient had a decrease in activity level because of a fear of falling?  Yes    Is the patient reluctant to leave their home because of a fear of falling?  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    Leisure running, jogging, bowling      Observation/Other Assessments   Focus on Therapeutic Outcomes (FOTO)  NA      Sensation   Light Touch Appears Intact      Posture/Postural Control   Posture/Postural Control Postural limitations    Postural Limitations Rounded Shoulders;Forward head      ROM / Strength   AROM / PROM / Strength AROM;Strength      AROM   Overall AROM  Within functional limits for tasks performed      Strength   Overall Strength Within functional limits for tasks performed    Overall Strength Comments slightly decreased functional strength per 5x sit<>stand test      Transfers   Transfers Sit to Stand;Stand to Sit    Sit to Stand 6: Modified independent (Device/Increase time);Without upper extremity assist;From chair/3-in-1    Five time sit to stand  comments  13.53    Stand to Sit 6: Modified independent (Device/Increase time);Without upper extremity assist;To chair/3-in-1      Ambulation/Gait   Ambulation/Gait Yes    Ambulation/Gait Assistance 5: Supervision    Ambulation Distance (Feet) 200 Feet    Assistive device None    Gait Pattern Step-through pattern;Decreased arm swing - right;Decreased arm swing - left;Wide base of support;Ataxic    Ambulation Surface Level;Indoor    Gait velocity 13.56 sec = 2.4 ft/sec      Standardized Balance Assessment   Standardized Balance Assessment Dynamic Gait Index      Dynamic Gait Index   Level Surface Mild Impairment   >8.5 sec   Change in Gait Speed Mild Impairment    Gait with Horizontal Head Turns Moderate Impairment   11 sec   Gait with Vertical Head Turns Mild Impairment   >9.5 sec   Gait and Pivot Turn Normal    Step Over Obstacle Moderate Impairment    Step Around Obstacles Moderate Impairment    Steps Mild Impairment    Total Score 14    DGI comment: Scores <19/24 indicate increased fall risk.                        Objective measurements completed on examination: See above findings.               PT Education - 07/22/20 1827     Education Details PT eval results, POC    Person(s) Educated Patient   significant other   Comprehension Verbalized understanding              PT Short Term Goals - 07/22/20 1836       PT SHORT TERM GOAL #1   Title Pt will perform HEP with family supervision for improved strength, balance, transfers, and gait.    TARGET 08/20/2020    Baseline No current HEP    Time 4    Period Weeks    Status New      PT SHORT TERM GOAL #2   Title Pt will improve 5x sit<>stand to less than or equal to 11.5 sec to demonstrate improved functional strength and transfer efficiency.    Baseline 13.53 sec    Time 4    Period Weeks    Status New      PT SHORT TERM GOAL #3   Title Pt will improve DGI score to at least 17/24 to  decrease fall risk.    Baseline 14/24 at eval  Time 4    Period Weeks    Status New      PT SHORT TERM GOAL #4   Title Pt will verbalize understanding of fall prevention in home environment.    Baseline at fall risk per DGI    Time 4    Period Weeks               PT Long Term Goals - 07/22/20 1839       PT LONG TERM GOAL #1   Title Pt will perform progression of HEP with family supervision for improved strength, balance, transfers, and gait.  TARGET 09/17/2020    Baseline No current HEP    Time 8    Period Weeks    Status New      PT LONG TERM GOAL #2   Title Pt will improve gait velocity to at least 2.8 ft/sec for improved gait efficiency and safety.    Baseline 2.4 ft/sec    Time 8    Period Weeks    Status New      PT LONG TERM GOAL #3   Title Pt will improve DGI score to at least 22/24 to decrease fall risk.    Baseline 14/24    Time 8    Period Weeks    Status New      PT LONG TERM GOAL #4   Title Pt will ambulate at least 1000 ft, indoor and outdoor surfaces, independently, no LOB for improved community gait.    Baseline currently needs supervision for gait    Time 8    Period Weeks    Status New                    Plan - 07/22/20 1830     Clinical Impression Statement Nathaniel Spears presented for OPPT evaluation to assess mobility/balance s/p multiple strokes. Initial stroke happened in April 2022. Pt was participating in OPST/OT treatment prior to hospitalization in early May. Most recent stroke occured end of May 2022, in which pt hospitalized from 06-15-20 to 07-08-20.  He was discharge home at that time, and pt/significant other report he is improving with mobility, but he wants to return to being fully independent.  Pt presents with decreased functional strength, ataxia/abnormal gait pattern, abnormal posture, decreased balance.  He is at risk of falls per DGI and demo limited community ambulator gait velocity.  He would benefit from skilled PT to  address the above stated deficits to decrease fall risk and improve overall functional mobility and return to independence.    Personal Factors and Comorbidities Comorbidity 3+    Comorbidities PMH:  DM, HTN, HLD    Examination-Activity Limitations Locomotion Level;Transfers    Examination-Participation Restrictions Community Activity;Occupation    Stability/Clinical Decision Making Evolving/Moderate complexity    Clinical Decision Making Moderate    Rehab Potential Good    PT Frequency 2x / week    PT Duration 8 weeks   plus eval   PT Treatment/Interventions ADLs/Self Care Home Management;Gait training;Stair training;Functional mobility training;Therapeutic activities;Therapeutic exercise;Balance training;Neuromuscular re-education;Patient/family education    PT Next Visit Plan Initiate HEP for functional strength, balance; work on dynamic balance and gait; may need to assess vestibular system for balance.    Consulted and Agree with Plan of Care Patient;Family member/caregiver    Family Member Consulted Nathaniel Spears             Patient will benefit from skilled therapeutic intervention in order to improve the following deficits  and impairments:  Abnormal gait, Difficulty walking, Impaired tone, Decreased balance, Decreased mobility, Decreased strength, Postural dysfunction  Visit Diagnosis: Other abnormalities of gait and mobility  Unsteadiness on feet  Muscle weakness (generalized)     Problem List Patient Active Problem List   Diagnosis Date Noted   AKI (acute kidney injury) (HCC) 07/07/2020   Middle cerebral artery embolism, 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 07/22/2020, 6:46 PM Gean MaidensMARRIOTT,Nathaniel Wadsworth W., PT   [facility] [address] [place], [place], [address] Phone: [phone]   Fax:  [phone]  Name: Nathaniel Spears MRN: [id number] Date of Birth: 07/17/1975

## 2020-07-23 ENCOUNTER — Other Ambulatory Visit (HOSPITAL_COMMUNITY): Payer: Self-pay

## 2020-07-23 ENCOUNTER — Telehealth (HOSPITAL_COMMUNITY): Payer: Self-pay | Admitting: Pharmacist

## 2020-07-23 NOTE — Telephone Encounter (Signed)
Max calls reached.  Unable to reach patient.

## 2020-08-03 ENCOUNTER — Other Ambulatory Visit: Payer: Self-pay

## 2020-08-03 ENCOUNTER — Ambulatory Visit: Payer: Medicaid Other | Admitting: Physical Therapy

## 2020-08-03 ENCOUNTER — Other Ambulatory Visit (INDEPENDENT_AMBULATORY_CARE_PROVIDER_SITE_OTHER): Payer: Self-pay | Admitting: Primary Care

## 2020-08-03 ENCOUNTER — Ambulatory Visit: Payer: Medicaid Other | Admitting: Occupational Therapy

## 2020-08-03 ENCOUNTER — Ambulatory Visit: Payer: Medicaid Other

## 2020-08-03 DIAGNOSIS — I69353 Hemiplegia and hemiparesis following cerebral infarction affecting right non-dominant side: Secondary | ICD-10-CM

## 2020-08-03 DIAGNOSIS — R41840 Attention and concentration deficit: Secondary | ICD-10-CM

## 2020-08-03 DIAGNOSIS — R2689 Other abnormalities of gait and mobility: Secondary | ICD-10-CM

## 2020-08-03 DIAGNOSIS — M6281 Muscle weakness (generalized): Secondary | ICD-10-CM

## 2020-08-03 DIAGNOSIS — R2681 Unsteadiness on feet: Secondary | ICD-10-CM

## 2020-08-03 DIAGNOSIS — R482 Apraxia: Secondary | ICD-10-CM

## 2020-08-03 DIAGNOSIS — R4701 Aphasia: Secondary | ICD-10-CM

## 2020-08-03 DIAGNOSIS — R41842 Visuospatial deficit: Secondary | ICD-10-CM

## 2020-08-03 NOTE — Patient Instructions (Signed)
Access Code: [email or web address] URL: [email or web address] Date: 08/03/2020 Prepared by: Lonia Blood  Exercises Sit to Stand - 1 x daily - 5 x weekly - 1-2 sets - 10 reps Mini Squat with Counter Support - 1 x daily - 5 x weekly - 1-2 sets - 10 reps Standing Marching - 1 x daily - 5 x weekly - 1-2 sets - 10 reps

## 2020-08-03 NOTE — Patient Instructions (Signed)
   Use relaxation strategies to calm your self down. You talk better when you are more calm.

## 2020-08-03 NOTE — Therapy (Signed)
[facility] [address] [place], [place], [address] Phone: [phone]   Fax:  [phone]  Physical Therapy Treatment  Patient Details  Name: Nathaniel Spears MRN: [id number] Date of Birth: May 12, 1975 Referring Provider (PT): Raulkar (follow-up)   Encounter Date: 08/03/2020   PT End of Session - 08/03/20 0724     Visit Number 2    Number of Visits 17    Date for PT Re-Evaluation 09/17/20    Authorization Type Self-Pay at eval; Medicaid potential    PT Start Time 0722    PT Stop Time 0802    PT Time Calculation (min) 40 min    Activity Tolerance Patient tolerated treatment well    Behavior During Therapy [facility] for tasks assessed/performed             Past Medical History:  Diagnosis Date   Hypertension    Stroke [facility])     Past Surgical History:  Procedure Laterality Date   BUBBLE STUDY  06/01/2020   Procedure: BUBBLE STUDY;  Surgeon: Little Ishikawa, MD;  Location: [facility] ENDOSCOPY;  Service: Cardiovascular;;   IR ANGIO INTRA EXTRACRAN SEL COM CAROTID INNOMINATE BILAT MOD SED  06/02/2020   IR ANGIO VERTEBRAL SEL SUBCLAVIAN INNOMINATE BILAT MOD SED  06/02/2020   TEE WITHOUT CARDIOVERSION N/A 06/01/2020   Procedure: TRANSESOPHAGEAL ECHOCARDIOGRAM (TEE);  Surgeon: Little Ishikawa, MD;  Location: [facility] ENDOSCOPY;  Service: Cardiovascular;  Laterality: N/A;    There were no vitals filed for this visit.   Subjective Assessment - 08/03/20 0724     Subjective No falls, no changes since eval.    Patient is accompained by: Family member   significant other, [name]   Patient Stated Goals Pt wants to become more independent.    Currently in Pain? No/denies                               [facility] Adult PT Treatment/Exercise - 08/03/20 0001       Transfers   Transfers Sit to Stand;Stand to Sit    Sit to Stand 6: Modified independent (Device/Increase time);Without upper extremity  assist;From bed    Stand to Sit 6: Modified independent (Device/Increase time);Without upper extremity assist;To bed    Number of Reps 10 reps;1 set      High Level Balance   High Level Balance Activities Backward walking;Side stepping   Fwd/back along counter x 3; sidestep x 3   High Level Balance Comments Tandem stance (with space between feet, difficulty sequencing to place feet):  30 seconds; SLS RLE 1.19 sec, LLE 5.47 sec.  Standing EO and EC solid surface x 30 sec      Exercises   Exercises Knee/Hip;Ankle      Knee/Hip Exercises: Aerobic   Stepper Seated stepper, SciFit, Level 1.5, 4 extremities x 5 minutes, for leg strength and flexibility.      Knee/Hip Exercises: Standing   Hip Flexion Stengthening;Right;Left;1 set;10 reps;Knee bent   Marching in place at counter, cues for step height   Forward Step Up Right;Left;1 set;10 reps;Hand Hold: 2;Step Height: 6"    Forward Step Up Limitations Forward step up/up, down/down with tactile and verbal cues for sequence    Functional Squat 1 set;10 reps      Ankle Exercises: Standing   Heel Raises Both;10 reps;3 seconds    Toe Raise 10 reps;3 seconds  PT Education - 08/03/20 0758     Education Details HEP initiated-see instructions    Person(s) Educated Patient;Spouse    Methods Explanation;Demonstration;Handout    Comprehension Verbalized understanding;Returned demonstration;Verbal cues required              PT Short Term Goals - 07/22/20 1836       PT SHORT TERM GOAL #1   Title Pt will perform HEP with family supervision for improved strength, balance, transfers, and gait.    TARGET 08/20/2020    Baseline No current HEP    Time 4    Period Weeks    Status New      PT SHORT TERM GOAL #2   Title Pt will improve 5x sit<>stand to less than or equal to 11.5 sec to demonstrate improved functional strength and transfer efficiency.    Baseline 13.53 sec    Time 4    Period Weeks    Status New       PT SHORT TERM GOAL #3   Title Pt will improve DGI score to at least 17/24 to decrease fall risk.    Baseline 14/24 at eval    Time 4    Period Weeks    Status New      PT SHORT TERM GOAL #4   Title Pt will verbalize understanding of fall prevention in home environment.    Baseline at fall risk per DGI    Time 4    Period Weeks               PT Long Term Goals - 07/22/20 1839       PT LONG TERM GOAL #1   Title Pt will perform progression of HEP with family supervision for improved strength, balance, transfers, and gait.  TARGET 09/17/2020    Baseline No current HEP    Time 8    Period Weeks    Status New      PT LONG TERM GOAL #2   Title Pt will improve gait velocity to at least 2.8 ft/sec for improved gait efficiency and safety.    Baseline 2.4 ft/sec    Time 8    Period Weeks    Status New      PT LONG TERM GOAL #3   Title Pt will improve DGI score to at least 22/24 to decrease fall risk.    Baseline 14/24    Time 8    Period Weeks    Status New      PT LONG TERM GOAL #4   Title Pt will ambulate at least 1000 ft, indoor and outdoor surfaces, independently, no LOB for improved community gait.    Baseline currently needs supervision for gait    Time 8    Period Weeks    Status New                   Plan - 08/03/20 0802     Clinical Impression Statement HEP initiated this visit for strength and balance.  Pt with difficulty sequencing some activities and following instructions for exercise, needing tactile and verbal cues for correct performance.  Assessed tandem, SLS and vestibular system on solid and compliant surfaces-deficits noted with hip stability for tandem and single limb stance, deficits noted with vestibular system for balanc.e  Will continue to address strenght, balance, gait towards LTGs.    Personal Factors and Comorbidities Comorbidity 3+    Comorbidities PMH:  DM, HTN, HLD    Examination-Activity  Limitations Locomotion Level;Transfers     Examination-Participation Restrictions Community Activity;Occupation    Stability/Clinical Decision Making Evolving/Moderate complexity    Rehab Potential Good    PT Frequency 2x / week    PT Duration 8 weeks   plus eval   PT Treatment/Interventions ADLs/Self Care Home Management;Gait training;Stair training;Functional mobility training;Therapeutic activities;Therapeutic exercise;Balance training;Neuromuscular re-education;Patient/family education    PT Next Visit Plan Review HEP and progress for functional strength, balance; work on dynamic balance and gait; corner balance exercises    Consulted and Agree with Plan of Care Patient;Family member/caregiver    Family Member Consulted [name]             Patient will benefit from skilled therapeutic intervention in order to improve the following deficits and impairments:  Abnormal gait, Difficulty walking, Impaired tone, Decreased balance, Decreased mobility, Decreased strength, Postural dysfunction  Visit Diagnosis: Muscle weakness (generalized)  Unsteadiness on feet  Other abnormalities of gait and mobility     Problem List Patient Active Problem List   Diagnosis Date Noted   AKI (acute kidney injury) (HCC) 07/07/2020   Middle cerebral artery embolism, 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 08/03/2020, 8:06 AM Gean Maidens., PT  [facility] [address] [place], [place], [address] Phone: [phone]   Fax:  [phone]  Name: Oreste Majeed MRN:  [id number] Date of Birth: 23-Jan-1975

## 2020-08-03 NOTE — Therapy (Signed)
[facility] [address] [place], [place], [address] Phone: [phone]   Fax:  [phone]  Occupational Therapy Evaluation  Patient Details  Name: Varian Innes MRN: [id number] Date of Birth: [birth year] Referring Provider (OT): Mariam Dollar PA-C   Encounter Date: 08/03/2020   OT End of Session - 08/03/20 0953     Visit Number 1    Number of Visits 17    Date for OT Re-Evaluation 11/03/20    Authorization Type Self Pay - did instruct in financial aid    OT Start Time 0848    OT Stop Time 0930    OT Time Calculation (min) 42 min    Activity Tolerance Patient tolerated treatment well    Behavior During Therapy [facility] for tasks assessed/performed             Past Medical History:  Diagnosis Date   Hypertension    Stroke [facility])     Past Surgical History:  Procedure Laterality Date   BUBBLE STUDY  06/01/2020   Procedure: BUBBLE STUDY;  Surgeon: Little Ishikawa, MD;  Location: [facility] ENDOSCOPY;  Service: Cardiovascular;;   IR ANGIO INTRA EXTRACRAN SEL COM CAROTID INNOMINATE BILAT MOD SED  06/02/2020   IR ANGIO VERTEBRAL SEL SUBCLAVIAN INNOMINATE BILAT MOD SED  06/02/2020   TEE WITHOUT CARDIOVERSION N/A 06/01/2020   Procedure: TRANSESOPHAGEAL ECHOCARDIOGRAM (TEE);  Surgeon: Little Ishikawa, MD;  Location: [facility] ENDOSCOPY;  Service: Cardiovascular;  Laterality: N/A;    There were no vitals filed for this visit.   Subjective Assessment - 08/03/20 0852     Patient is accompanied by: Family member   girlfriend   Pertinent History admitted on 06/15/2020 with c/o aphasia,  with multiple recent admits for repeated strokes. Pt most recently admitted 5/20-5/23 for acute strokes before being transferred at family request to [facility] for further work-up. While in the ED, pt had worsening aphasia compared to baseline. MRI reveals several new or increased acute infarcts are  demonstrated in both cerebral hemispheres (today  the bilateral MCA. PMH: HTN, T2DM (5 stokes since 04/20/20)    Limitations aphasia. apraxia. no driving.    Currently in Pain? No/denies               [facility] OT Assessment - 08/03/20 0001       Assessment   Medical Diagnosis L MCA CVA    Referring Provider (OT) Mariam Dollar PA-C    Onset Date/Surgical Date 04/20/20    Hand Dominance Left    Prior Therapy OPOT 4/21-5/10/22 but then had another stroke and went back to hospital until 07/08/20      Precautions   Precautions Fall    Precaution Comments Aphasia, no driving      Restrictions   Weight Bearing Restrictions No      Balance Screen   Has the patient fallen in the past 6 months --   see P.T. 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  Leisure running, jogging, bowling      ADL   Eating/Feeding Needs assist with cutting food    Grooming --   assist w/ shaving, independent brushing teeth   Upper Body Bathing Modified independent    Lower Body Bathing Modified independent    Upper Body Dressing Independent    Lower Body Dressing Modified independent   wears slip on pants, needs assist if buttons/belt of pants   Toilet Transfer Modified independent    Toileting - Clothing Manipulation Modified independent    Toileting -  Hygiene Modified Independent    Tub/Shower Transfer Minimal assistance      IADL   Shopping Needs to be accompanied on any shopping trip    Light Housekeeping Does not participate in any housekeeping tasks    Meal Prep Able to complete simple cold meal  and snack prep   snack only   Community Mobility Relies on family or friends for transportation    Medication Management Is not capable of dispensing or managing own medication    Financial Management Dependent      Mobility   Mobility Status Independent    Mobility Status Comments w/c for big shopping times      Written Expression   Dominant Hand Left    Handwriting 100% legible   name only in print     Vision - History   Baseline Vision No visual deficits    Additional Comments pt reports blurriness since the stroke.      Cognition   Cognition Comments Pt with severe expressive aphasia and mild to moderate receptive aphasia per speech eval. Not consistent with yes/no      Observation/Other Assessments   Observations aphasia, apraxia      Sensation   Light Touch --   inconsistent - unable to fully assess d/t aphasia     Coordination   Right 9 Hole Peg Test 44.13   11 sec. slower than eval in April. Slower partly d/t difficulty following instructions (demo only)   Left 9 Hole Peg Test 42.07 sec   15 sec slower than eval in April. Slower d/t difficulty following demo instructions     Praxis   Praxis Impaired    Praxis Impairment Details Motor planning      Edema   Edema none in UE's      AROM   Overall AROM  Within functional limits for tasks performed    Overall AROM Comments BUE AROM WFL's but did demo apraxia w/ demo cues      Strength   Overall Strength Comments BUE MMT grossly intact, possible slightly weaker Rt shoulder w/ flexion      Hand Function   Right Hand Grip (lbs) 44.9 lbs   lost 12 lbs since April   Left Hand Grip (lbs) 67.6 lbs                               OT Short Term Goals - 08/03/20 1003       OT SHORT TERM GOAL #1   Title Independent with bilateral coordination HEP and putty HEP for Rt hand    Time 4    Period Weeks    Status New      OT SHORT TERM GOAL #2   Title Pt will perform environmental scanning with 75%  accuracy    Time 4    Period Weeks    Status New  OT SHORT TERM GOAL #3   Title Pt will perform simple meal prep (cold and/or microwaveable) and light housekeeping with min A and good safety awareness    Time 4    Period Weeks    Status New      OT SHORT TERM GOAL #4   Title Pt to demo hooking/unhooking buttons on pants consistently    Time 4    Period Weeks    Status New      OT SHORT TERM GOAL #5   Title Pt to cut food with A/E prn safely    Time 4    Period Weeks    Status New               OT Long Term Goals - 08/03/20 1006       OT LONG TERM GOAL #1   Title Pt will verbalize understanding of adapted strategies for increasing independence and safety awareness with ADLs and IADLs (i.e. right inattention for cooking, laundry, motor planning for IADLs)    Time 8    Period Weeks    Status New      OT LONG TERM GOAL #2   Title Pt to increase grip strength Rt hand to 50 lbs or greater    Baseline 44.9 lbs    Time 8    Period Weeks    Status New      OT LONG TERM GOAL #3   Title Pt will perform simple warm meal prep and light housekeeping with supervision and good safety awareness.    Time 8    Period Weeks    Status New      OT LONG TERM GOAL #4   Title Pt will improve coordination bilaterally by 10 sec or greater    Baseline Rt = 44.13 sec, Lt = 42.07 sec    Time 8    Period Weeks    Status New                   Plan - 08/03/20 0956     Clinical Impression Statement Pt is a 45 yo male most recently admitted on 06/15/2020 with c/o aphasia,  with multiple recent admits for repeated strokes (5 strokes since April 20, 2020). Pt most recently admitted 5/20-5/23 for acute strokes before being transferred at family request to [facility] for further work-up. While in the ED, pt had worsening aphasia compared to baseline. MRI reveals several new or increased acute infarcts are  demonstrated in both cerebral hemispheres (today the bilateral MCA and left ACA  territories) since MRI 06/04/20. PMH includes: HTN, DM2, HLD. Pt presents today for second outpatient O.T. admission w/ continued aphasia, apraxia, decreased coordination, strength, balance, and visuospatial deficits.    OT Occupational Profile and History Detailed Assessment- Review of Records and additional review of physical, cognitive, psychosocial history related to current functional performance    Occupational performance deficits (Please refer to evaluation for details): ADL's;IADL's;Leisure;Social Participation    Body Structure / Function / Physical Skills ADL;Coordination;Vision;IADL;UE functional use;Proprioception;Strength;Balance;Dexterity;Mobility;FMC    Cognitive Skills Attention;Sequencing;Perception;Understand;Memory;Problem Solve;Safety Awareness   difficult to assess formally due to aphasia and apraxia   Rehab Potential Good    Comorbidities Affecting Occupational Performance: May have comorbidities impacting occupational performance    Modification or Assistance to Complete Evaluation  Min-Moderate modification of tasks or assist with assess necessary to complete eval    OT Frequency 2x / week    OT Duration 8 weeks   OR 16  visits over 12 weeks due to scheduling conflicts (possibly only being seen 1x/wk some weeks)   OT Treatment/Interventions Self-care/ADL training;Cognitive remediation/compensation;Visual/perceptual remediation/compensation;Patient/family education;Therapeutic activities;Therapeutic exercise;Functional Mobility Training;DME and/or AE instruction;Neuromuscular education;Manual Therapy    Plan issue coordination HEP for bilateral coordination and Rt grip strength    Consulted and Agree with Plan of Care Patient;Family member/caregiver    Family Member Consulted significant other, [name]             Patient will benefit from skilled therapeutic intervention in order to improve the following deficits and impairments:   Body Structure / Function / Physical  Skills: ADL, Coordination, Vision, IADL, UE functional use, Proprioception, Strength, Balance, Dexterity, Mobility, FMC Cognitive Skills: Attention, Sequencing, Perception, Understand, Memory, Problem Solve, Safety Awareness (difficult to assess formally due to aphasia and apraxia)     Visit Diagnosis: 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)  Unsteadiness on feet  Visuospatial deficit  Apraxia  Muscle weakness (generalized)  Attention and concentration deficit    Problem List Patient Active Problem List   Diagnosis Date Noted   AKI (acute kidney injury) (HCC) 07/07/2020   Middle cerebral artery embolism, 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   Acute ischemic stroke (HCC) 06/05/2020   Acute left-sided weakness 06/04/2020   Nicotine dependence 06/04/2020   Cocaine use 06/04/2020   Aphasia    Polysubstance abuse (HCC)    Type 2 diabetes mellitus with hyperglycemia, with long-term current use of insulin (HCC)    Hypokalemia    Acute CVA (cerebrovascular accident) (HCC) 04/20/2020   Essential hypertension 08/21/2019   Obesity 08/21/2019    Kelli ChurnBallie, Daiton Cowles Johnson, OTR/L 08/03/2020, 10:09 AM  [place] [facility] [address] [place], [place], [address] Phone: [phone]   Fax:  [phone]  Name: Kara Diesntonio Flurry MRN: [id number] Date of Birth: 09/03/1975

## 2020-08-03 NOTE — Telephone Encounter (Signed)
Sent to PCP to refill  

## 2020-08-03 NOTE — Therapy (Signed)
[facility] [address] [place], [place], [address] Phone: [phone]   Fax:  [phone]  Speech Language Pathology Treatment  Patient Details  Name: Nathaniel Spears MRN: [id number] Date of Birth: [birth year] Referring Provider (SLP): Charlton Amor, PA-C   Encounter Date: 08/03/2020   End of Session - 08/03/20 1347     Visit Number 2    Number of Visits 25    Date for SLP Re-Evaluation 10/14/20    Authorization Type self-pay    SLP Start Time 0804    SLP Stop Time  0845    SLP Time Calculation (min) 41 min    Activity Tolerance Patient tolerated treatment well             Past Medical History:  Diagnosis Date   Hypertension    Stroke [facility])     Past Surgical History:  Procedure Laterality Date   BUBBLE STUDY  06/01/2020   Procedure: BUBBLE STUDY;  Surgeon: Little Ishikawa, MD;  Location: [facility] ENDOSCOPY;  Service: Cardiovascular;;   IR ANGIO INTRA EXTRACRAN SEL COM CAROTID INNOMINATE BILAT MOD SED  06/02/2020   IR ANGIO VERTEBRAL SEL SUBCLAVIAN INNOMINATE BILAT MOD SED  06/02/2020   TEE WITHOUT CARDIOVERSION N/A 06/01/2020   Procedure: TRANSESOPHAGEAL ECHOCARDIOGRAM (TEE);  Surgeon: Little Ishikawa, MD;  Location: [facility] ENDOSCOPY;  Service: Cardiovascular;  Laterality: N/A;    There were no vitals filed for this visit.   Subjective Assessment - 08/03/20 0808     Subjective "[name]."    Patient is accompained by: Family member   [name] - SO   Currently in Pain? No/denies                   ADULT SLP TREATMENT - 08/03/20 0809       General Information   Behavior/Cognition Alert;Cooperative;Pleasant mood;Requires cueing      Treatment Provided   Treatment provided Cognitive-Linquistic      Cognitive-Linquistic Treatment   Treatment focused on Apraxia;Aphasia;Dysarthria    Skilled Treatment [name]- "He's getting a little better. He talked to his aunt a few days ago and she  said he sounds much better." [name] endorses pt gets frustrated when he can't generate the desired word/s. Cloze phrase completion 100% success; more open-ended phrase completion (e.g., "cut me a piece of  ___".) copmleted 95% success. "Tell me one thing" about a picture - pt generated 1 or 2-word phrase 70% of the time and this increased to 100% with mod cues. Pt then answered a question from SLP re: the pictures he spontaneously generated a response for and he produced a functional response 75% of the time. [name] noted to provide phonemic and semantic cues mostly at apprpriate times (rarely were a bit too early). SLP told pt and [name] that he appears to have more language output when he is more relaxed and encouraged him to perform relaxation strategies such as close eyes and deep breathing in order to calm down - he will likely have more success with language/speech production using these strategies.      Assessment / Recommendations / Plan   Plan Continue with current plan of care;Goals updated      Progression Toward Goals   Progression toward goals Progressing toward goals              SLP Education - 08/03/20 1346     Education Details close eyes/deep breath to relax    Person(s) Educated Patient;Caregiver(s)    Methods  Explanation;Demonstration    Comprehension Verbalized understanding              SLP Short Term Goals - 08/03/20 1347       SLP SHORT TERM GOAL #1   Title Pt will name 5-10 words in personally relevant categories with occasional mod A over 2 sessions    Time 4    Period Weeks    Status On-going      SLP SHORT TERM GOAL #2   Title Pt will read sentences with ability to correct errors with 80% accuracy given occasional mod A over 2 sessions    Time 4    Period Weeks    Status On-going      SLP SHORT TERM GOAL #3   Title Pt will use external visual communication aids to augment verbal expression for communication of wants/needs for 4/5 opportunities  with occasional min A over 2 sessions    Time 4    Period Weeks    Status On-going      SLP SHORT TERM GOAL #4   Title Pt will complete picture description tasks by verbalizing at least 2 words for 8/10 opportunities with occasional mod A over 2 sessions    Time 4    Period Weeks    Status On-going      SLP SHORT TERM GOAL #5   Title Pt will verbalize 2-3 word phrases in response to simple conversational dialogue with usual mod A over 2 sessions    Time 4    Period Weeks    Status New              SLP Long Term Goals - 08/03/20 1348       SLP LONG TERM GOAL #1   Title Pt will verbalize 3-4 word sentences in response to simple open ended questions with usual min A over 2 sessions    Time 12    Period Weeks    Status On-going      SLP LONG TERM GOAL #2   Title Pt will use mulitmodal communication system to augment verbal expression to meet needs at home with occasional min A from family over 2 sessions    Time 12    Period Weeks    Status On-going      SLP LONG TERM GOAL #3   Title Pt/caregiver will report improvements in frustration level and communication effectivness via QOL scale by 2 points at last ST session    Baseline CPB: 0    Time 12    Period Weeks    Status On-going              Plan - 08/03/20 1347     Clinical Impression Statement Nathaniel Spears presented for OPST evaluation to assess current communication s/p multiple strokes. Initial stroke happened in April 2022. Pt was participating in OPSTtreatment prior to hospitalization in early May. Most recent stroke occured at end of May 2022, in which pt hospitalized from 06-15-20 to 07-08-20. Today, pt was accompanied by partner, [name]. Danial unable to provide majority of medical hx due to severe aphasia and apraxia; therefore [name] detailed recent strokes and hospital stay. [name] reports current communication is similiar to last presentation to OPST intervention. Pt nodded in agreement. No changes in  cognition or swallow function reported. Quick Aphasia Battery re-adminstered this session, with overall moderate aphasia indicated. Assessment indicates persisent severe expressive aphasia, mild to moderate receptive aphasia, and mild apraxia. Pt exhibited 1-2 words answers in  connected speech tasks, with usual fillers ("um") and empty speech noted. Pt exhibited decreased auditory comprehension for mod complex yes/no questions. Adequate picture naming exhibited this session, with pt able to self-correct paraphasia x1. Pt exhibited usual verbal perseverations during assessment of connected speech and reading at sentence level. Pt able to write simple biographical information (name and birthdate) but pt unable to spontanously write words or sentences. [name] is using "cue cards" at home to help pt select choices and continues to practice with HEP provided during last OPST intervention. [name] completed Communication Participation Item Bank, in which she indicated patient has "very much" difficulty with every item resulting in score of 0. Skilled ST intervention is warranted to address moderate to severe communication deficits impacting patient's ability to participate in simple conversations and communicate wants and needs effectively.    Speech Therapy Frequency 2x / week    Duration 12 weeks   POC written for 12 weeks to account for scheduling conflicts or missed visits, anticipate D/C from therapy in 8 weeks   Treatment/Interventions Compensatory strategies;Cueing hierarchy;Functional tasks;Patient/family education;Cognitive reorganization;Multimodal communcation approach;Environmental controls;Compensatory techniques;Internal/external aids;SLP instruction and feedback;Language facilitation    Potential to Achieve Goals Fair    Potential Considerations Severity of impairments    SLP Home Exercise Plan provided    Consulted and Agree with Plan of Care Patient;Family member/caregiver    Family Member  Consulted [name]             Patient will benefit from skilled therapeutic intervention in order to improve the following deficits and impairments:   Aphasia  Verbal apraxia    Problem List Patient Active Problem List   Diagnosis Date Noted   AKI (acute kidney injury) (HCC) 07/07/2020   Middle cerebral artery embolism, 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 ,MS, CCC-SLP  08/03/2020, 1:48 PM  [place] [facility] [address] [place], [place], [address] Phone: [phone]   Fax:  [phone]   Name: Nathaniel Spears MRN: [id number] Date of Birth: 1975-08-10

## 2020-08-04 ENCOUNTER — Other Ambulatory Visit: Payer: Self-pay

## 2020-08-04 ENCOUNTER — Other Ambulatory Visit (INDEPENDENT_AMBULATORY_CARE_PROVIDER_SITE_OTHER): Payer: Self-pay | Admitting: Primary Care

## 2020-08-04 MED ORDER — ATORVASTATIN CALCIUM 80 MG PO TABS
80.0000 mg | ORAL_TABLET | Freq: Every day | ORAL | 0 refills | Status: DC
  Filled 2020-08-04: qty 30, 30d supply, fill #0
  Filled 2020-09-09: qty 30, 30d supply, fill #1
  Filled 2020-10-07: qty 30, 30d supply, fill #2

## 2020-08-04 MED ORDER — AMLODIPINE BESYLATE 10 MG PO TABS
10.0000 mg | ORAL_TABLET | Freq: Every day | ORAL | 1 refills | Status: DC
  Filled 2020-08-04: qty 30, 30d supply, fill #0
  Filled 2020-09-09: qty 30, 30d supply, fill #1
  Filled 2020-10-07: qty 30, 30d supply, fill #2
  Filled 2020-11-19: qty 90, 90d supply, fill #3

## 2020-08-04 MED ORDER — FLUOXETINE HCL 20 MG PO CAPS
20.0000 mg | ORAL_CAPSULE | Freq: Every day | ORAL | 0 refills | Status: DC
  Filled 2020-08-04: qty 30, 30d supply, fill #0
  Filled 2020-09-09: qty 30, 30d supply, fill #1
  Filled 2020-10-07: qty 30, 30d supply, fill #2

## 2020-08-05 ENCOUNTER — Other Ambulatory Visit: Payer: Self-pay

## 2020-08-09 ENCOUNTER — Other Ambulatory Visit: Payer: Self-pay

## 2020-08-10 ENCOUNTER — Telehealth (INDEPENDENT_AMBULATORY_CARE_PROVIDER_SITE_OTHER): Payer: Medicaid Other | Admitting: Primary Care

## 2020-08-10 ENCOUNTER — Other Ambulatory Visit: Payer: Self-pay

## 2020-08-10 ENCOUNTER — Other Ambulatory Visit (INDEPENDENT_AMBULATORY_CARE_PROVIDER_SITE_OTHER): Payer: Self-pay

## 2020-08-10 DIAGNOSIS — R4701 Aphasia: Secondary | ICD-10-CM | POA: Diagnosis not present

## 2020-08-10 DIAGNOSIS — I639 Cerebral infarction, unspecified: Secondary | ICD-10-CM

## 2020-08-10 NOTE — Progress Notes (Signed)
[facility]  T called and hopefully can be elephone Note  I connected with [name], on 08/10/2020 at 11:18 AM through by telephone and verified that I am speaking with the correct person using two identifiers.   Consent: I discussed the limitations, risks, security and privacy concerns of performing an evaluation and management service by telephone and the availability of in person appointments. I also discussed with the patient that there may be a patient responsible charge related to this service. The patient expressed understanding and agreed to proceed.   Location of Patient: Home  Location of Provider: [facility]   Persons participating in Telemedicine visit: [name] Gwinda Passe,  NP  History of Present Illness:  [name] ( [name] was given verbal permission to help with this visit .follow up evaluation of Type 2 diabetes mellitus.  Current symptoms/problems include none and have been.  Current diabetic medications include insulin injections:  Levimir 10 daily - metformin discontinued in the hospital .   The patient was initially diagnosed with Type 2 diabetes mellitus based on the following criteria:  ADA guidelines .  Current monitoring regimen: home blood tests - daily Home blood sugar records: 120-135 Any episodes of hypoglycemia? no Past Medical History:  Diagnosis Date   Hypertension    Stroke (HCC)    Allergies  Allergen Reactions   Milk-Related Compounds     Current Outpatient Medications on File Prior to Visit  Medication Sig Dispense Refill   acetaminophen (TYLENOL) 325 MG tablet Take 2 tablets (650 mg total) by mouth every 4 (four) hours as needed for mild pain (or temp > 37.5 C (99.5 F)). 20 tablet 0   amLODipine (NORVASC) 10 MG tablet Take 1 tablet (10 mg total) by mouth daily. 90 tablet 1   aspirin 325 MG EC tablet Starting 07/16/20 take 1 tablet by  mouth  daily with clopidogrel for 2 months, then after that will only take clopidogrel 60 tablet 0   aspirin 81 MG EC tablet Take 1 tablet daily with Brilinta through 6/30 - on 7/1 will change to aspirin 325 plus clopidogrel (Patient not taking: Reported on 07/22/2020) 8 tablet 0   atorvastatin (LIPITOR) 80 MG tablet Take 1 tablet (80 mg total) by mouth daily. 90 tablet 0   clopidogrel (PLAVIX) 75 MG tablet Starting 07/16/20 -Take 1 tablet (75 mg total) by mouth daily. 30 tablet 0   FLUoxetine (PROZAC) 20 MG capsule Take 1 capsule (20 mg total) by mouth daily. 90 capsule 0   insulin detemir (LEVEMIR FLEXTOUCH) 100 UNIT/ML FlexPen Inject 10 Units into the skin at bedtime. 15 mL 11   Insulin Pen Needle (PENTIPS) 32G X 4 MM MISC Use as directed 100 each 1   metoprolol succinate (TOPROL-XL) 25 MG 24 hr tablet Take 0.5 tablets (12.5 mg total) by mouth daily. 30 tablet 0   ticagrelor (BRILINTA) 90 MG TABS tablet Take 1 tablet (90 mg total) by mouth 2 (two) times daily until 07/15/20 (therapy will change to clopidogrel on 07/16/20) (Patient not taking: Reported on 07/22/2020) 16 tablet 0   Vitamin D, Ergocalciferol, (DRISDOL) 1.25 MG (50000 UNIT) CAPS capsule Take 1 capsule (50,000 Units total) by mouth every 7 (seven) days. 5 capsule 0   No current facility-administered medications on file prior to visit.    Observations/Objective: There were no vitals taken for this visit.  Review of Systems  Neurological:  Positive for speech change and weakness.  Left side  All other systems reviewed and are negative.  Assessment and Plan: Diagnoses and all orders for this visit:  Aphasia Continues to be in therapy s/p CVA  Acute CVA (cerebrovascular accident) (HCC)  Hemiplegia and hemiparesis following cerebral infarction affecting right non-dominant side In PT REFER TO NEUROLOGY Follow Up Instructions: 3 MONTH   I discussed the assessment and treatment plan with the patient. The patient was provided an  opportunity to ask questions and all were answered. The patient agreed with the plan and demonstrated an understanding of the instructions.   The patient was advised to call back or seek an in-person evaluation if the symptoms worsen or if the condition fails to improve as anticipated.     I provided 20 minutes total of non-face-to-face time during this encounter including median intraservice time, reviewing previous notes, investigations, ordering medications, medical decision making, coordinating care and patient verbalized understanding at the end of the visit.    This note has been created with Education officer, environmental. Any transcriptional errors are unintentional.   Grayce Sessions, NP 08/10/2020, 11:18 AM

## 2020-08-11 ENCOUNTER — Other Ambulatory Visit: Payer: Self-pay

## 2020-08-11 ENCOUNTER — Emergency Department (HOSPITAL_COMMUNITY)
Admission: EM | Admit: 2020-08-11 | Discharge: 2020-08-12 | Disposition: A | Discharge status: Home / Self Care | Payer: Medicaid Other | Attending: Emergency Medicine | Admitting: Emergency Medicine

## 2020-08-11 ENCOUNTER — Encounter (HOSPITAL_COMMUNITY): Payer: Self-pay

## 2020-08-11 DIAGNOSIS — Z5321 Procedure and treatment not carried out due to patient leaving prior to being seen by health care provider: Secondary | ICD-10-CM | POA: Diagnosis not present

## 2020-08-11 DIAGNOSIS — H53149 Visual discomfort, unspecified: Secondary | ICD-10-CM | POA: Diagnosis not present

## 2020-08-11 DIAGNOSIS — H5712 Ocular pain, left eye: Secondary | ICD-10-CM | POA: Diagnosis not present

## 2020-08-11 NOTE — ED Triage Notes (Signed)
Pt reports left eye pain and thinks he has something in his eye. Pt also reports blurred vision in eye.

## 2020-08-12 ENCOUNTER — Encounter (HOSPITAL_COMMUNITY): Payer: Self-pay

## 2020-08-12 ENCOUNTER — Ambulatory Visit: Payer: Medicaid Other

## 2020-08-12 ENCOUNTER — Ambulatory Visit (HOSPITAL_COMMUNITY)
Admission: EM | Admit: 2020-08-12 | Discharge: 2020-08-12 | Disposition: A | Discharge status: Home / Self Care | Payer: Medicaid Other | Attending: Internal Medicine | Admitting: Internal Medicine

## 2020-08-12 ENCOUNTER — Other Ambulatory Visit: Payer: Self-pay

## 2020-08-12 ENCOUNTER — Ambulatory Visit: Payer: Medicaid Other | Admitting: Occupational Therapy

## 2020-08-12 ENCOUNTER — Ambulatory Visit: Payer: Medicaid Other | Admitting: Physical Therapy

## 2020-08-12 VITALS — BP 130/87 | HR 69

## 2020-08-12 DIAGNOSIS — R482 Apraxia: Secondary | ICD-10-CM

## 2020-08-12 DIAGNOSIS — R278 Other lack of coordination: Secondary | ICD-10-CM

## 2020-08-12 DIAGNOSIS — M6281 Muscle weakness (generalized): Secondary | ICD-10-CM

## 2020-08-12 DIAGNOSIS — I69353 Hemiplegia and hemiparesis following cerebral infarction affecting right non-dominant side: Secondary | ICD-10-CM

## 2020-08-12 DIAGNOSIS — R2681 Unsteadiness on feet: Secondary | ICD-10-CM

## 2020-08-12 DIAGNOSIS — T1592XA Foreign body on external eye, part unspecified, left eye, initial encounter: Secondary | ICD-10-CM

## 2020-08-12 DIAGNOSIS — R4701 Aphasia: Secondary | ICD-10-CM

## 2020-08-12 DIAGNOSIS — H5712 Ocular pain, left eye: Secondary | ICD-10-CM | POA: Diagnosis not present

## 2020-08-12 DIAGNOSIS — R41840 Attention and concentration deficit: Secondary | ICD-10-CM

## 2020-08-12 DIAGNOSIS — R2689 Other abnormalities of gait and mobility: Secondary | ICD-10-CM | POA: Diagnosis not present

## 2020-08-12 MED ORDER — ERYTHROMYCIN 5 MG/GM OP OINT
TOPICAL_OINTMENT | OPHTHALMIC | 0 refills | Status: DC
  Filled 2020-08-12: qty 3.5, 7d supply, fill #0

## 2020-08-12 MED ORDER — TETRACAINE HCL 0.5 % OP SOLN
OPHTHALMIC | Status: AC
  Filled 2020-08-12: qty 4

## 2020-08-12 MED ORDER — FLUORESCEIN SODIUM 1 MG OP STRP
ORAL_STRIP | OPHTHALMIC | Status: AC
  Filled 2020-08-12: qty 4

## 2020-08-12 NOTE — ED Provider Notes (Signed)
[facility]    CSN: [id number] Arrival date & time: 08/12/20  1111      History   Chief Complaint Chief Complaint  Patient presents with   Eye Problem    HPI Nathaniel Spears is a 45 y.o. male.   Patient presents to urgent care for 1 day history of left eye discomfort and swelling.  Patient denies getting anything in his eye but states that they were cleaning the house yesterday and may have gotten some dust in his eye.  Eye is painful and itchy with watery discharge.  Denies any purulent drainage from eye.  Does have some mild blurry vision at times with eye.  Denies any upper respiratory symptoms.   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   Acute ischemic stroke (HCC) 06/05/2020   Acute left-sided weakness 06/04/2020   Nicotine dependence 06/04/2020   Cocaine use 06/04/2020   Aphasia    Polysubstance abuse (HCC)    Type 2 diabetes mellitus with hyperglycemia, with long-term current use of insulin (HCC)    Hypokalemia    Acute CVA (cerebrovascular accident) (HCC) 04/20/2020   Essential hypertension 08/21/2019   Obesity 08/21/2019    Past Surgical History:  Procedure Laterality Date   BUBBLE STUDY  06/01/2020   Procedure: BUBBLE STUDY;  Surgeon: Little Ishikawa, MD;  Location: [facility] ENDOSCOPY;  Service: Cardiovascular;;   IR ANGIO INTRA EXTRACRAN SEL COM CAROTID INNOMINATE BILAT MOD SED  06/02/2020   IR ANGIO VERTEBRAL SEL SUBCLAVIAN INNOMINATE BILAT MOD SED  06/02/2020   TEE WITHOUT CARDIOVERSION N/A 06/01/2020   Procedure: TRANSESOPHAGEAL ECHOCARDIOGRAM (TEE);  Surgeon: Little Ishikawa, MD;  Location: [facility] ENDOSCOPY;  Service: Cardiovascular;  Laterality: N/A;       Home Medications    Prior to Admission medications   Medication Sig Start  Date End Date Taking? Authorizing Provider  erythromycin ophthalmic ointment Place a 1/2 inch ribbon of ointment into the lower eyelid twice daily for 7 days. 08/12/20  Yes Lance Muss, FNP  acetaminophen (TYLENOL) 325 MG tablet Take 2 tablets (650 mg total) by mouth every 4 (four) hours as needed for mild pain (or temp > 37.5 C (99.5 F)). 06/03/20   Marguerita Merles Latif, DO  amLODipine (NORVASC) 10 MG tablet Take 1 tablet (10 mg total) by mouth daily. 08/04/20   Grayce Sessions, NP  aspirin 325 MG EC tablet Starting 07/16/20 take 1 tablet by  mouth daily with clopidogrel for 2 months, then after that will only take clopidogrel 07/07/20   Angiulli, Mcarthur Rossetti, PA-C  aspirin 81 MG EC tablet Take 1 tablet daily with Brilinta through 6/30 - on 7/1 will change to aspirin 325 plus clopidogrel Patient not taking: No sig reported 07/07/20   Angiulli, Mcarthur Rossetti, PA-C  atorvastatin (LIPITOR) 80 MG tablet Take 1 tablet (80 mg total) by mouth daily. 08/04/20   Grayce Sessions, NP  clopidogrel (PLAVIX) 75 MG tablet Starting 07/16/20 -Take 1 tablet (75 mg total) by mouth daily. 07/16/20 07/16/21  Angiulli, Mcarthur Rossetti, PA-C  FLUoxetine (PROZAC) 20 MG capsule Take 1 capsule (20 mg total) by mouth daily. 08/04/20   Grayce Sessions, NP  insulin detemir (LEVEMIR FLEXTOUCH) 100 UNIT/ML FlexPen Inject 10 Units into the skin at bedtime. 07/07/20  AngiulliMcarthur Rossetti, PA-C  Insulin Pen Needle (PENTIPS) 32G X 4 MM MISC Use as directed 07/07/20   Angiulli, Mcarthur Rossetti, PA-C  metoprolol succinate (TOPROL-XL) 25 MG 24 hr tablet Take 0.5 tablets (12.5 mg total) by mouth daily. 07/07/20   Angiulli, Mcarthur Rossetti, PA-C  ticagrelor (BRILINTA) 90 MG TABS tablet Take 1 tablet (90 mg total) by mouth 2 (two) times daily until 07/15/20 (therapy will change to clopidogrel on 07/16/20) Patient not taking: No sig reported 07/07/20   Angiulli, Mcarthur Rossetti, PA-C  Vitamin D, Ergocalciferol, (DRISDOL) 1.25 MG (50000 UNIT) CAPS capsule Take 1 capsule (50,000 Units  total) by mouth every 7 (seven) days. 07/08/20   Angiulli, Mcarthur Rossetti, PA-C    Family History Family History  Problem Relation Age of Onset   Stroke Neg Hx     Social History Social History   Tobacco Use   Smoking status: Former   Smokeless tobacco: Never  Building services engineer Use: Never used  Substance Use Topics   Alcohol use: Not Currently   Drug use: Never     Allergies   Milk-related compounds   Review of Systems Review of Systems Per HPI  Physical Exam Triage Vital Signs ED Triage Vitals  Enc Vitals Group     BP 08/12/20 1219 137/86     Pulse Rate 08/12/20 1219 66     Resp 08/12/20 1219 18     Temp 08/12/20 1219 98.4 F (36.9 C)     Temp Source 08/12/20 1219 Oral     SpO2 08/12/20 1219 99 %     Weight --      Height --      Head Circumference --      Peak Flow --      Pain Score 08/12/20 1323 10     Pain Loc --      Pain Edu? --      Excl. in GC? --    No data found.  Updated Vital Signs BP 137/86 (BP Location: Right Arm)   Pulse 66   Temp 98.4 F (36.9 C) (Oral)   Resp 18   SpO2 99%   Visual Acuity Right Eye Distance: 20/40 Left Eye Distance: 20/50 Bilateral Distance: 20/50  Right Eye Near:   Left Eye Near:    Bilateral Near:     Physical Exam Constitutional:      General: He is not in acute distress.    Appearance: Normal appearance.  HENT:     Head: Normocephalic and atraumatic.  Eyes:     General:        Right eye: No foreign body.        Left eye: Foreign body present.No discharge.     Extraocular Movements: Extraocular movements intact.     Conjunctiva/sclera: Conjunctivae normal.     Comments: Upper and lower eyelids of left eye mildly swollen.  Sclera is mildly erythematous.  2 pinpoint foreign bodies resembling dust or debris present in left eye.  No fluorescein reuptake noted.  Pulmonary:     Effort: Pulmonary effort is normal.  Neurological:     General: No focal deficit present.     Mental Status: He is alert and  oriented to person, place, and time. Mental status is at baseline.  Psychiatric:        Mood and Affect: Mood normal.        Behavior: Behavior normal.        Thought Content: Thought content normal.  Judgment: Judgment normal.     UC Treatments / Results  Labs (all labs ordered are listed, but only abnormal results are displayed) Labs Reviewed - No data to display  EKG   Radiology No results found.  Procedures Procedures (including critical care time)  Medications Ordered in UC Medications - No data to display  Initial Impression / Assessment and Plan / UC Course  I have reviewed the triage vital signs and the nursing notes.  Pertinent labs & imaging results that were available during my care of the patient were reviewed by me and considered in my medical decision making (see chart for details).     There were 2 pinpoint foreign bodies resembling dust or debris present in left eye on exam.  Eye was flushed and irrigated with successful removal of these foreign bodies.  Fluorescein stain performed with no fluorescein stain reuptake.  Will prescribe erythromycin x7 days to prevent and/or treat infection.  Patient to follow-up with ophthalmology if blurry vision worsens or if eye symptoms do not improve.  Visual acuity test unremarkable on physical exam.  Do not see need for emergent ophthalmology referral at this time. Discussed strict return precautions. Patient and caregiver verbalized understanding and is agreeable with plan.  Final Clinical Impressions(s) / UC Diagnoses   Final diagnoses:  Foreign body of left eye, initial encounter  Discomfort of left eye     Discharge Instructions      Please follow-up with provided contact information for ophthalmology in 1 to 3 days if symptoms do not improve or if blurry vision worsens.  Please take prescribed antibiotic as directed.     ED Prescriptions     Medication Sig Dispense Auth. Provider   erythromycin  ophthalmic ointment Place a 1/2 inch ribbon of ointment into the lower eyelid twice daily for 7 days. 3.5 g Lance Muss, FNP      PDMP not reviewed this encounter.   Lance Muss, FNP 08/12/20 1416

## 2020-08-12 NOTE — Patient Instructions (Signed)
  Coordination Activities  Perform the following activities for 15 minutes 1-2 times per day with both hand(s).  Rotate ball in fingertips (clockwise and counter-clockwise). Toss ball in air and catch with the same hand. Flip cards 1 at a time as fast as you can. Deal cards with your thumb (Hold deck in hand and push card off top with thumb). Rotate 1 card in hand (clockwise and counter-clockwise). Pick up coins one at a time until you get 5 in your hand, then move coins from palm to fingertips to stack one at a time. Practice writing name/address with Lt hand, and copying sentence.  Screw together nuts and bolts, then unfasten.   1. Grip Strengthening (Resistive Putty)   Squeeze putty using thumb and all fingers. Repeat _20___ times. Do __2__ sessions per day.   2. Roll putty into tube on table and pinch between first two fingers and thumb x 10 reps. Do 2 sessions per day

## 2020-08-12 NOTE — ED Triage Notes (Signed)
Pt reports pain and blurry vision in the left eye x 1 day. Denies headache, dizziness,numbness, tingling, weakness  Per pt girlfriend he had 5 strokes since 04/20/2020.

## 2020-08-12 NOTE — Therapy (Signed)
[facility] [address] [place], [place], [address] Phone: [phone]   Fax:  [phone]  Speech Language Pathology Treatment  Patient Details  Name: Nathaniel Spears MRN: [id number] Date of Birth: Mar 05, 1975 Referring Provider (SLP): Charlton Amor, PA-C   Encounter Date: 08/12/2020   End of Session - 08/12/20 0959     Visit Number 3    Number of Visits 25    Date for SLP Re-Evaluation 10/14/20    Authorization Type self-pay    SLP Start Time 1015    SLP Stop Time  1100    SLP Time Calculation (min) 45 min    Activity Tolerance Patient tolerated treatment well             Past Medical History:  Diagnosis Date   Hypertension    Stroke [facility])     Past Surgical History:  Procedure Laterality Date   BUBBLE STUDY  06/01/2020   Procedure: BUBBLE STUDY;  Surgeon: Little Ishikawa, MD;  Location: [facility] ENDOSCOPY;  Service: Cardiovascular;;   IR ANGIO INTRA EXTRACRAN SEL COM CAROTID INNOMINATE BILAT MOD SED  06/02/2020   IR ANGIO VERTEBRAL SEL SUBCLAVIAN INNOMINATE BILAT MOD SED  06/02/2020   TEE WITHOUT CARDIOVERSION N/A 06/01/2020   Procedure: TRANSESOPHAGEAL ECHOCARDIOGRAM (TEE);  Surgeon: Little Ishikawa, MD;  Location: [facility] ENDOSCOPY;  Service: Cardiovascular;  Laterality: N/A;    There were no vitals filed for this visit.   Subjective Assessment - 08/12/20 1000     Subjective "good"    Currently in Pain? Yes    Pain Score 10-Worst pain ever    Pain Location Eye    Pain Orientation Left                   ADULT SLP TREATMENT - 08/12/20 0958       General Information   Behavior/Cognition Alert;Cooperative;Pleasant mood;Requires cueing      Treatment Provided   Treatment provided Cognitive-Linquistic      Cognitive-Linquistic Treatment   Treatment focused on Apraxia;Aphasia;Dysarthria    Skilled Treatment Pt entered with swollen left eye. Family went to ER last night to get  it checked but left prior to being seen due to wait time. Pt is going to urgent care after therapy. Pt endorses 10/10 pain but requested to continue ST session. SLP targeted functional verbal communication to communicate at urgent care, in which pt generated short phrases to describe problem with usual min to mod prompting and questioning cues. MLU increased from 2 words to 7 words when provided with sentence starter. Improved reading accuracy exhibited at sentence level this session, with rare apraxic or aphasic errors noted. During session, pt stated "I have to go to the bathroom." SLP targeted open ended sentence completion, in which pt verbalized response with 65% accuracy independently. [name] significant other provided appropriate min to mod semantic cues when anomia/dysnomia occurred. Some additional processing time would have been appreciated for patient to process and name and will be addressed as needed in upcoming sessions.      Assessment / Recommendations / Plan   Plan Continue with current plan of care      Progression Toward Goals   Progression toward goals Progressing toward goals              SLP Education - 08/12/20 1301     Education Details functional practice, HEP    Person(s) Educated Patient;Caregiver(s)    Methods Explanation;Demonstration;Handout    Comprehension Verbalized understanding;Returned  demonstration;Need further instruction              SLP Short Term Goals - 08/12/20 0959       SLP SHORT TERM GOAL #1   Title Pt will name 5-10 words in personally relevant categories with occasional mod A over 2 sessions    Time 3    Period Weeks    Status On-going      SLP SHORT TERM GOAL #2   Title Pt will read sentences with ability to correct errors with 80% accuracy given occasional mod A over 2 sessions    Baseline 08-12-20    Time 3    Period Weeks    Status On-going      SLP SHORT TERM GOAL #3   Title Pt will use external visual communication aids to  augment verbal expression for communication of wants/needs for 4/5 opportunities with occasional min A over 2 sessions    Time 3    Period Weeks    Status On-going      SLP SHORT TERM GOAL #4   Title Pt will complete picture description tasks by verbalizing at least 2 words for 8/10 opportunities with occasional mod A over 2 sessions    Time 3    Period Weeks    Status On-going      SLP SHORT TERM GOAL #5   Title Pt will verbalize 2-3 word phrases in response to simple conversational dialogue with usual mod A over 2 sessions    Time 3    Period Weeks    Status On-going              SLP Long Term Goals - 08/12/20 1000       SLP LONG TERM GOAL #1   Title Pt will verbalize 3-4 word sentences in response to simple open ended questions with usual min A over 2 sessions    Time 11    Period Weeks    Status On-going      SLP LONG TERM GOAL #2   Title Pt will use mulitmodal communication system to augment verbal expression to meet needs at home with occasional min A from family over 2 sessions    Time 11    Period Weeks    Status On-going      SLP LONG TERM GOAL #3   Title Pt/caregiver will report improvements in frustration level and communication effectivness via QOL scale by 2 points at last ST session    Baseline CPB: 0    Time 11    Period Weeks    Status On-going              Plan - 08/12/20 1302     Clinical Impression Statement Nathaniel Spears presented for OPST intervention to assess current communication s/p multiple strokes. Initial stroke happened in April 2022. SLP targeted generation of functional phrases to utilize at medical appointment this afternoon, with usual prompting and questioning cues required to expand responses. Pt exhibited improved reading at sentence level this session, with rare apraxic or aphasic errors exhibited. See "skilled treatment" for additional details of today's session. Skilled ST intervention is warranted to address moderate to severe  communication deficits impacting patient's ability to participate in simple conversations and communicate wants and needs effectively.    Speech Therapy Frequency 2x / week    Duration 12 weeks   POC written for 12 weeks to account for scheduling conflicts or missed visits, anticipate D/C from therapy in 8 weeks  Treatment/Interventions Compensatory strategies;Cueing hierarchy;Functional tasks;Patient/family education;Cognitive reorganization;Multimodal communcation approach;Environmental controls;Compensatory techniques;Internal/external aids;SLP instruction and feedback;Language facilitation    Potential to Achieve Goals Fair    Potential Considerations Severity of impairments;Previous level of function    SLP Home Exercise Plan provided    Consulted and Agree with Plan of Care Patient;Family member/caregiver    Family Member Consulted [name]             Patient will benefit from skilled therapeutic intervention in order to improve the following deficits and impairments:   Aphasia  Verbal apraxia    Problem List Patient Active Problem List   Diagnosis Date Noted   AKI (acute kidney injury) (HCC) 07/07/2020   Middle cerebral artery embolism, 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 [place], [place], [address] Phone: [phone]   Fax:  [phone]   Name: Montey Ebel MRN: [id number] Date of Birth: 1975/05/01

## 2020-08-12 NOTE — Therapy (Signed)
[facility] [address] [place], [place], [address] Phone: [phone]   Fax:  [phone]  Physical Therapy Treatment  Patient Details  Name: Nathaniel Spears MRN: [id number] Date of Birth: [birth year] Referring Provider (PT): Raulkar (follow-up)   Encounter Date: 08/12/2020   PT End of Session - 08/12/20 0850     Visit Number 3    Number of Visits 17    Date for PT Re-Evaluation 09/17/20    Authorization Type Self-Pay at eval; Medicaid potential    PT Start Time 0850    PT Stop Time 0928    PT Time Calculation (min) 38 min    Activity Tolerance Patient tolerated treatment well    Behavior During Therapy [facility] for tasks assessed/performed             Past Medical History:  Diagnosis Date   Hypertension    Stroke [facility])     Past Surgical History:  Procedure Laterality Date   BUBBLE STUDY  06/01/2020   Procedure: BUBBLE STUDY;  Surgeon: Little Ishikawa, MD;  Location: [facility] ENDOSCOPY;  Service: Cardiovascular;;   IR ANGIO INTRA EXTRACRAN SEL COM CAROTID INNOMINATE BILAT MOD SED  06/02/2020   IR ANGIO VERTEBRAL SEL SUBCLAVIAN INNOMINATE BILAT MOD SED  06/02/2020   TEE WITHOUT CARDIOVERSION N/A 06/01/2020   Procedure: TRANSESOPHAGEAL ECHOCARDIOGRAM (TEE);  Surgeon: Little Ishikawa, MD;  Location: [facility] ENDOSCOPY;  Service: Cardiovascular;  Laterality: N/A;    Vitals:   08/12/20 0853 08/12/20 0857 08/12/20 0928  BP: (!) 143/97 (!) 129/94 130/87  Pulse: 73  69     Subjective Assessment - 08/12/20 0853     Subjective Tried to go to ED (eventually left after 4 hours) yesterday due to pain and swelling in his eye.  Wife doesn't think it is stroke related; no other changes.  No falls.  Wife feels balance is getting better.    Patient is accompained by: Family member   significant other, [name]   Patient Stated Goals Pt wants to become more independent.    Currently in Pain? Yes    Pain Score 10-Worst  pain ever    Pain Location Eye    Pain Orientation Left    Pain Type Acute pain    Pain Onset Yesterday    Pain Frequency Constant    Aggravating Factors  unsure-feels like something is in his eye    Pain Relieving Factors unsure                          Access Code: [email or web address] URL: [email or web address] Date: 08/03/2020 Prepared by: Lonia Blood   Exercises-Reviewed last session's HEP with pt return demo with minimal cues. Sit to Stand - 1 x daily - 5 x weekly - 1-2 sets - 10 reps Mini Squat with Counter Support - 1 x daily - 5 x weekly - 1-2 sets - 10 reps Standing Marching - 1 x daily - 5 x weekly - 1-2 sets - 10 reps                 Balance Exercises - 08/12/20 0001       Balance Exercises: Standing   Standing Eyes Opened Wide (BOA);Foam/compliant surface;5 reps;Limitations;Narrow base of support (BOS)    Standing Eyes Opened Limitations HEad turns/head nods x 5    Tandem Stance Eyes open;Upper extremity support 1;Intermittent upper extremity support;3 reps;15 secs    SLS Eyes open;Solid surface;Upper extremity support 1;3  reps;10 secs    Tandem Gait Forward;Retro;Upper extremity support;3 reps;Limitations    Tandem Gait Limitations Cues to look ahead, not down at feet; 3 laps at counter    Retro Gait 3 reps;Limitations    Retro Gait Limitations Fwd/back at counter, intermittent to no UE support    Sidestepping Foam/compliant support;Upper extremity support;3 reps    Marching Solid surface;Upper extremity assist 1;Dynamic;Forwards;Retro;Limitations    Marching Limitations 3 reps along counter    Heel Raises Both;10 reps;Limitations   2nd set on red mat   Heel Raises Limitations on pillows in corner    Toe Raise Both;10 reps;Limitations   2nd set on red mat   Toe Raise Limitations on pillows in corner               PT Education - 08/12/20 0935     Education Details Blood pressure measures; follow up regarding eye  pain/blurred vision-education/review to make sure no new CVA symptoms    Person(s) Educated Patient;Spouse    Methods Explanation    Comprehension Verbalized understanding              PT Short Term Goals - 07/22/20 1836       PT SHORT TERM GOAL #1   Title Pt will perform HEP with family supervision for improved strength, balance, transfers, and gait.    TARGET 08/20/2020    Baseline No current HEP    Time 4    Period Weeks    Status New      PT SHORT TERM GOAL #2   Title Pt will improve 5x sit<>stand to less than or equal to 11.5 sec to demonstrate improved functional strength and transfer efficiency.    Baseline 13.53 sec    Time 4    Period Weeks    Status New      PT SHORT TERM GOAL #3   Title Pt will improve DGI score to at least 17/24 to decrease fall risk.    Baseline 14/24 at eval    Time 4    Period Weeks    Status New      PT SHORT TERM GOAL #4   Title Pt will verbalize understanding of fall prevention in home environment.    Baseline at fall risk per DGI    Time 4    Period Weeks               PT Long Term Goals - 07/22/20 1839       PT LONG TERM GOAL #1   Title Pt will perform progression of HEP with family supervision for improved strength, balance, transfers, and gait.  TARGET 09/17/2020    Baseline No current HEP    Time 8    Period Weeks    Status New      PT LONG TERM GOAL #2   Title Pt will improve gait velocity to at least 2.8 ft/sec for improved gait efficiency and safety.    Baseline 2.4 ft/sec    Time 8    Period Weeks    Status New      PT LONG TERM GOAL #3   Title Pt will improve DGI score to at least 22/24 to decrease fall risk.    Baseline 14/24    Time 8    Period Weeks    Status New      PT LONG TERM GOAL #4   Title Pt will ambulate at least 1000 ft, indoor and outdoor surfaces, independently, no  LOB for improved community gait.    Baseline currently needs supervision for gait    Time 8    Period Weeks    Status New                    Plan - 08/12/20 0931     Clinical Impression Statement Reviewed initial HEP, with pt return demo understanding.  Focused on additional dynamic and static balance as well as compliant surface balance activities; unable to fully address corner balance exercises with vision removed/head motions, due to pt's eye pain and swelling (he plans to go to urgent care today).  He will conitnue to benefit from skilled PT to further address balance and gait towards goals.    Personal Factors and Comorbidities Comorbidity 3+    Comorbidities PMH:  DM, HTN, HLD    Examination-Activity Limitations Locomotion Level;Transfers    Examination-Participation Restrictions Community Activity;Occupation    Stability/Clinical Decision Making Evolving/Moderate complexity    Rehab Potential Good    PT Frequency 2x / week    PT Duration 8 weeks   plus eval   PT Treatment/Interventions ADLs/Self Care Home Management;Gait training;Stair training;Functional mobility training;Therapeutic activities;Therapeutic exercise;Balance training;Neuromuscular re-education;Patient/family education    PT Next Visit Plan Progress HEP for balance and progress for functional strength; work on dynamic balance and gait; corner balance exercises    Consulted and Agree with Plan of Care Patient;Family member/caregiver    Family Member Consulted [name]             Patient will benefit from skilled therapeutic intervention in order to improve the following deficits and impairments:  Abnormal gait, Difficulty walking, Impaired tone, Decreased balance, Decreased mobility, Decreased strength, Postural dysfunction  Visit Diagnosis: Muscle weakness (generalized)  Unsteadiness on feet     Problem List Patient Active Problem List   Diagnosis Date Noted   AKI (acute kidney injury) (HCC) 07/07/2020   Middle cerebral artery embolism, 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 08/12/2020, 9:36 AM Gean Maidens., PT   [facility] [address] [place], [place], [address] Phone: [phone]   Fax:  [phone]  Name: Nathaniel Spears MRN: [id number] Date of Birth: [birth year]

## 2020-08-12 NOTE — Discharge Instructions (Addendum)
Please follow-up with provided contact information for ophthalmology in 1 to 3 days if symptoms do not improve or if blurry vision worsens.  Please take prescribed antibiotic as directed.

## 2020-08-12 NOTE — Therapy (Signed)
[facility] [address] [place], [place], [address] Phone: [phone]   Fax:  [phone]  Occupational Therapy Treatment  Patient Details  Name: Nathaniel Spears MRN: [id number] Date of Birth: 1975/04/04 Referring Provider (OT): Mariam Dollar PA-C   Encounter Date: 08/12/2020   OT End of Session - 08/12/20 1019     Visit Number 2    Number of Visits 17    Date for OT Re-Evaluation 11/03/20    Authorization Type Self Pay - did instruct in financial aid    OT Start Time 0930    OT Stop Time 1015    OT Time Calculation (min) 45 min    Activity Tolerance Patient tolerated treatment well    Behavior During Therapy [facility] for tasks assessed/performed             Past Medical History:  Diagnosis Date   Hypertension    Stroke [facility])     Past Surgical History:  Procedure Laterality Date   BUBBLE STUDY  06/01/2020   Procedure: BUBBLE STUDY;  Surgeon: Little Ishikawa, MD;  Location: [facility] ENDOSCOPY;  Service: Cardiovascular;;   IR ANGIO INTRA EXTRACRAN SEL COM CAROTID INNOMINATE BILAT MOD SED  06/02/2020   IR ANGIO VERTEBRAL SEL SUBCLAVIAN INNOMINATE BILAT MOD SED  06/02/2020   TEE WITHOUT CARDIOVERSION N/A 06/01/2020   Procedure: TRANSESOPHAGEAL ECHOCARDIOGRAM (TEE);  Surgeon: Little Ishikawa, MD;  Location: [facility] ENDOSCOPY;  Service: Cardiovascular;  Laterality: N/A;    There were no vitals filed for this visit.   Subjective Assessment - 08/12/20 0936     Subjective  Girlfriend reports eye pain began yesterday (Lt eye red, possibly infected?)    Patient is accompanied by: Family member   girlfriend   Pertinent History admitted on 06/15/2020 with c/o aphasia,  with multiple recent admits for repeated strokes. Pt most recently admitted 5/20-5/23 for acute strokes before being transferred at family request to [facility] for further work-up. While in the ED, pt had worsening aphasia compared to baseline. MRI reveals  several new or increased acute infarcts are  demonstrated in both cerebral hemispheres (today the bilateral MCA. PMH: HTN, T2DM (5 stokes since 04/20/20)    Limitations aphasia. apraxia. no driving.    Currently in Pain? Yes    Pain Score 10-Worst pain ever    Pain Location Eye   OT not addressing directly but pt will go to Urgent Care following this appointment   Pain Orientation Left             Pt has redness Lt eye - girlfriend reports they will go to Urgent Care after appointments today. ? Infection. No signs of new stroke.   Pt issued coordination activities for bilateral hands - see pt instructions for details. Pt had difficulty transitioning b/t hands and b/t tasks and initially required hand over hand assist, but then able to do I'ly. Minimal motoric deficits, mostly d/t aphasia and mild apraxia.   Pt practiced writing name and copying address w/ min cues for address.   Red putty issued for Rt hand strengthening - see pt instructions for details.                      OT Education - 08/12/20 0957     Education Details coordination HEP (bilateral), putty HEP for Rt hand    Person(s) Educated Patient;Spouse    Methods Explanation;Demonstration;Verbal cues;Handout    Comprehension Verbalized understanding;Need further instruction;Returned demonstration  OT Short Term Goals - 08/12/20 1020       OT SHORT TERM GOAL #1   Title Independent with bilateral coordination HEP and putty HEP for Rt hand    Time 4    Period Weeks    Status On-going      OT SHORT TERM GOAL #2   Title Pt will perform environmental scanning with 75% accuracy    Time 4    Period Weeks    Status New      OT SHORT TERM GOAL #3   Title Pt will perform simple meal prep (cold and/or microwaveable) and light housekeeping with min A and good safety awareness    Time 4    Period Weeks    Status New      OT SHORT TERM GOAL #4   Title Pt to demo hooking/unhooking buttons  on pants consistently    Time 4    Period Weeks    Status New      OT SHORT TERM GOAL #5   Title Pt to cut food with A/E prn safely    Time 4    Period Weeks    Status New               OT Long Term Goals - 08/03/20 1006       OT LONG TERM GOAL #1   Title Pt will verbalize understanding of adapted strategies for increasing independence and safety awareness with ADLs and IADLs (i.e. right inattention for cooking, laundry, motor planning for IADLs)    Time 8    Period Weeks    Status New      OT LONG TERM GOAL #2   Title Pt to increase grip strength Rt hand to 50 lbs or greater    Baseline 44.9 lbs    Time 8    Period Weeks    Status New      OT LONG TERM GOAL #3   Title Pt will perform simple warm meal prep and light housekeeping with supervision and good safety awareness.    Time 8    Period Weeks    Status New      OT LONG TERM GOAL #4   Title Pt will improve coordination bilaterally by 10 sec or greater    Baseline Rt = 44.13 sec, Lt = 42.07 sec    Time 8    Period Weeks    Status New                   Plan - 08/12/20 1020     Clinical Impression Statement Pt progressing towards STG #1. Pt with cues needed to change motor tasks    OT Occupational Profile and History Detailed Assessment- Review of Records and additional review of physical, cognitive, psychosocial history related to current functional performance    Occupational performance deficits (Please refer to evaluation for details): ADL's;IADL's;Leisure;Social Participation    Body Structure / Function / Physical Skills ADL;Coordination;Vision;IADL;UE functional use;Proprioception;Strength;Balance;Dexterity;Mobility;FMC    Cognitive Skills Attention;Sequencing;Perception;Understand;Memory;Problem Solve;Safety Awareness   difficult to assess formally due to aphasia and apraxia   Rehab Potential Good    Comorbidities Affecting Occupational Performance: May have comorbidities impacting  occupational performance    Modification or Assistance to Complete Evaluation  Min-Moderate modification of tasks or assist with assess necessary to complete eval    OT Frequency 2x / week    OT Duration 8 weeks   OR 16 visits over 12 weeks due to  scheduling conflicts (possibly only being seen 1x/wk some weeks)   OT Treatment/Interventions Self-care/ADL training;Cognitive remediation/compensation;Visual/perceptual remediation/compensation;Patient/family education;Therapeutic activities;Therapeutic exercise;Functional Mobility Training;DME and/or AE instruction;Neuromuscular education;Manual Therapy    Plan review HEP prn, copy PVC pipe design and/or peg design for coord and visual/perceptual skills (following session: practice simulating cutting food, progress towards remaining STG's)    Consulted and Agree with Plan of Care Patient;Family member/caregiver    Family Member Consulted significant other, [name]             Patient will benefit from skilled therapeutic intervention in order to improve the following deficits and impairments:   Body Structure / Function / Physical Skills: ADL, Coordination, Vision, IADL, UE functional use, Proprioception, Strength, Balance, Dexterity, Mobility, FMC Cognitive Skills: Attention, Sequencing, Perception, Understand, Memory, Problem Solve, Safety Awareness (difficult to assess formally due to aphasia and apraxia)     Visit Diagnosis: 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)  Attention and concentration deficit  Other lack of coordination    Problem List Patient Active Problem List   Diagnosis Date Noted   AKI (acute kidney injury) (HCC) 07/07/2020   Middle cerebral artery embolism, 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 [place], [place], [address] Phone: [phone]   Fax:  [phone]  Name: Nathaniel Spears MRN: [id number] Date of Birth: 1975/10/30

## 2020-08-12 NOTE — ED Notes (Signed)
Patient stated they were leaving and went home.

## 2020-08-13 ENCOUNTER — Ambulatory Visit: Payer: Medicaid Other | Admitting: Occupational Therapy

## 2020-08-13 ENCOUNTER — Other Ambulatory Visit: Payer: Self-pay

## 2020-08-13 ENCOUNTER — Ambulatory Visit: Payer: Medicaid Other | Admitting: Physical Therapy

## 2020-08-13 ENCOUNTER — Ambulatory Visit: Payer: Medicaid Other

## 2020-08-13 ENCOUNTER — Encounter: Payer: Self-pay | Admitting: Physical Therapy

## 2020-08-13 DIAGNOSIS — R482 Apraxia: Secondary | ICD-10-CM

## 2020-08-13 DIAGNOSIS — M6281 Muscle weakness (generalized): Secondary | ICD-10-CM

## 2020-08-13 DIAGNOSIS — R2689 Other abnormalities of gait and mobility: Secondary | ICD-10-CM

## 2020-08-13 DIAGNOSIS — R2681 Unsteadiness on feet: Secondary | ICD-10-CM

## 2020-08-13 DIAGNOSIS — R4701 Aphasia: Secondary | ICD-10-CM

## 2020-08-13 NOTE — Therapy (Signed)
[facility] [address] [place], [place], [address] Phone: [phone]   Fax:  [phone]  Speech Language Pathology Treatment  Patient Details  Name: Nathaniel Spears MRN: [id number] Date of Birth: February 07, 1975 Referring Provider (SLP): Charlton Amor, PA-C   Encounter Date: 08/13/2020   End of Session - 08/13/20 0755     Visit Number 4    Number of Visits 25    Date for SLP Re-Evaluation 10/14/20    Authorization Type self-pay    SLP Start Time 0802    SLP Stop Time  0845    SLP Time Calculation (min) 43 min    Activity Tolerance Patient tolerated treatment well             Past Medical History:  Diagnosis Date   Hypertension    Stroke [facility])     Past Surgical History:  Procedure Laterality Date   BUBBLE STUDY  06/01/2020   Procedure: BUBBLE STUDY;  Surgeon: Little Ishikawa, MD;  Location: [facility] ENDOSCOPY;  Service: Cardiovascular;;   IR ANGIO INTRA EXTRACRAN SEL COM CAROTID INNOMINATE BILAT MOD SED  06/02/2020   IR ANGIO VERTEBRAL SEL SUBCLAVIAN INNOMINATE BILAT MOD SED  06/02/2020   TEE WITHOUT CARDIOVERSION N/A 06/01/2020   Procedure: TRANSESOPHAGEAL ECHOCARDIOGRAM (TEE);  Surgeon: Little Ishikawa, MD;  Location: [facility] ENDOSCOPY;  Service: Cardiovascular;  Laterality: N/A;    There were no vitals filed for this visit.   Subjective Assessment - 08/13/20 0756     Subjective "It was irrigated" re: eye flush at urgent care yesterday    Patient is accompained by: Family member    Currently in Pain? No/denies    Pain Score --    Pain Location --                   ADULT SLP TREATMENT - 08/13/20 0754       General Information   Behavior/Cognition Alert;Cooperative;Pleasant mood;Requires cueing      Treatment Provided   Treatment provided Cognitive-Linquistic      Cognitive-Linquistic Treatment   Treatment focused on Apraxia;Aphasia;Dysarthria    Skilled Treatment Pt stated  "it was irrigated" regarding eye flush at urgent care yesterday. Pain has resolved. Pt unable to verbalize exercises completed during PT session today. SLP recommended patient use gestures to "act it out" in which pt able to do with min to mod verbal and visual cues. SLP targeted open ended sentence completion, in which increased processing time required this session compared to last. SLP educated pt's SO to wait to cue, in which pt able to ID occasional words given extra time. Pt able to name word for sentence completion with 40% accuracy independently, which improved to 100% accurcy given min cues. SLP engaged patient in divergent naming tasks related to hobbies. Usual min to mod semantic cues required to name related items. SLP targeted naming of classic cars given visual stimuli, in which pt able to occasionally name items. Occasional perseverations exhibited. Occasional semantic and phonemic cues provided by SO, which were effective.      Assessment / Recommendations / Plan   Plan Continue with current plan of care      Progression Toward Goals   Progression toward goals Progressing toward goals              SLP Education - 08/13/20 1207     Education Details wait to cue, functional naming practice, HEP    Person(s) Educated Patient;Caregiver(s)    Methods  Explanation;Demonstration;Handout    Comprehension Verbalized understanding;Returned demonstration;Need further instruction              SLP Short Term Goals - 08/13/20 0755       SLP SHORT TERM GOAL #1   Title Pt will name 5-10 words in personally relevant categories with occasional mod A over 2 sessions    Baseline 08-13-20    Time 3    Period Weeks    Status On-going      SLP SHORT TERM GOAL #2   Title Pt will read sentences with ability to correct errors with 80% accuracy given occasional mod A over 2 sessions    Baseline 08-12-20    Time 3    Period Weeks    Status On-going      SLP SHORT TERM GOAL #3   Title Pt  will use external visual communication aids to augment verbal expression for communication of wants/needs for 4/5 opportunities with occasional min A over 2 sessions    Time 3    Period Weeks    Status On-going      SLP SHORT TERM GOAL #4   Title Pt will complete picture description tasks by verbalizing at least 2 words for 8/10 opportunities with occasional mod A over 2 sessions    Time 3    Period Weeks    Status On-going      SLP SHORT TERM GOAL #5   Title Pt will verbalize 2-3 word phrases in response to simple conversational dialogue with usual mod A over 2 sessions    Time 3    Period Weeks    Status On-going              SLP Long Term Goals - 08/13/20 0755       SLP LONG TERM GOAL #1   Title Pt will verbalize 3-4 word sentences in response to simple open ended questions with usual min A over 2 sessions    Time 11    Period Weeks    Status On-going      SLP LONG TERM GOAL #2   Title Pt will use mulitmodal communication system to augment verbal expression to meet needs at home with occasional min A from family over 2 sessions    Time 11    Period Weeks    Status On-going      SLP LONG TERM GOAL #3   Title Pt/caregiver will report improvements in frustration level and communication effectivness via QOL scale by 2 points at last ST session    Baseline CPB: 0    Time 11    Period Weeks    Status On-going              Plan - 08/13/20 1208     Clinical Impression Statement Nathaniel Spears presented for OPST intervention to address apraxia and aphasia s/p multiple strokes in past 6 months. SLP targeted divergent naming related to favorite hobbies, with usual min to mod semantic and phonemic cues required to name items. [name] significant other provided appropriate cues and provided additional processing as need today given SLP education. See "skilled treatment" for additional details of today's session. Skilled ST intervention is warranted to address moderate to severe  communication deficits impacting patient's ability to participate in simple conversations and communicate wants and needs effectively.    Speech Therapy Frequency 2x / week    Duration 12 weeks    Treatment/Interventions Compensatory strategies;Cueing hierarchy;Functional tasks;Patient/family education;Cognitive reorganization;Multimodal communcation approach;Environmental controls;Compensatory  techniques;Internal/external aids;SLP instruction and feedback;Language facilitation    Potential to Achieve Goals Fair    Potential Considerations Severity of impairments;Previous level of function    SLP Home Exercise Plan provided    Consulted and Agree with Plan of Care Patient;Family member/caregiver    Family Member Consulted Nathaniel Spears             Patient will benefit from skilled therapeutic intervention in order to improve the following deficits and impairments:   Aphasia  Verbal apraxia    Problem List Patient Active Problem List   Diagnosis Date Noted   AKI (acute kidney injury) (HCC) 07/07/2020   Middle cerebral artery embolism, 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   Acute ischemic stroke (HCC) 06/05/2020   Acute left-sided weakness 06/04/2020   Nicotine dependence 06/04/2020   Cocaine use 06/04/2020   Aphasia    Polysubstance abuse (HCC)    Type 2 diabetes mellitus with hyperglycemia, with long-term current use of insulin (HCC)    Hypokalemia    Acute CVA (cerebrovascular accident) (HCC) 04/20/2020   Essential hypertension 08/21/2019   Obesity 08/21/2019    Janann Colonel, MA CCC-SLP 08/13/2020, 12:10 PM  [facility] [address] [place], [place], [address] Phone: [phone]   Fax:  [phone]   Name: Nathaniel Spears MRN: [id number] Date of Birth: 1975/05/25

## 2020-08-13 NOTE — Patient Instructions (Addendum)
Access Code: [email or web address] URL: [email or web address] Date: 08/13/2020 Prepared by: Lonia Blood  Exercises Sit to Stand - 1 x daily - 5 x weekly - 1-2 sets - 10 reps Mini Squat with Counter Support - 1 x daily - 5 x weekly - 1-2 sets - 10 reps Standing Marching - 1 x daily - 5 x weekly - 1-2 sets - 10   Added 08/13/2020 Walking Tandem Stance - 1-2 x daily - 5 x weekly - 1 sets - 5 reps Walking with Head Rotation - 1-2 x daily - 5 x weekly - 1 sets - 3-5 reps Provided written handout for corner balance on pillow/towel  1) Feet apart, EO head turns/nods x 5 reps each  2) Feet apart, EC head steady x 10 sec  3) Feet together, EO head turns/nods x 5 reps each  4) Feet together, EC head steady x 10 sec

## 2020-08-13 NOTE — Therapy (Signed)
[facility] [address] [place], [place], [address] Phone: [phone]   Fax:  [phone]  Physical Therapy Treatment  Patient Details  Name: Nathaniel Spears MRN: [id number] Date of Birth: 10-07-1975 Referring Provider (PT): Raulkar (follow-up)   Encounter Date: 08/13/2020   PT End of Session - 08/13/20 0738     Visit Number 4    Number of Visits 17    Date for PT Re-Evaluation 09/17/20    Authorization Type Self-Pay at eval; Medicaid potential    PT Start Time 0724   Pt arrived late   PT Stop Time 0802    PT Time Calculation (min) 38 min    Activity Tolerance Patient tolerated treatment well    Behavior During Therapy [facility] for tasks assessed/performed             Past Medical History:  Diagnosis Date   Hypertension    Stroke [facility])     Past Surgical History:  Procedure Laterality Date   BUBBLE STUDY  06/01/2020   Procedure: BUBBLE STUDY;  Surgeon: Little Ishikawa, MD;  Location: [facility] ENDOSCOPY;  Service: Cardiovascular;;   IR ANGIO INTRA EXTRACRAN SEL COM CAROTID INNOMINATE BILAT MOD SED  06/02/2020   IR ANGIO VERTEBRAL SEL SUBCLAVIAN INNOMINATE BILAT MOD SED  06/02/2020   TEE WITHOUT CARDIOVERSION N/A 06/01/2020   Procedure: TRANSESOPHAGEAL ECHOCARDIOGRAM (TEE);  Surgeon: Little Ishikawa, MD;  Location: [facility] ENDOSCOPY;  Service: Cardiovascular;  Laterality: N/A;    There were no vitals filed for this visit.   Subjective Assessment - 08/13/20 0726     Subjective Much better today-the Urgent care flushed the eye and no more pain.    Patient is accompained by: Family member   significant other, [name]   Patient Stated Goals Pt wants to become more independent.    Currently in Pain? No/denies    Pain Onset Yesterday                               [facility] Adult PT Treatment/Exercise - 08/13/20 0001       Knee/Hip Exercises: Aerobic   Stepper Seated stepper, SciFit, Level  2, lower extremities only x 8 minutes, for leg strength and flexibility.            Cues to look ahead and to attend to speed on seated stepper.     Balance Exercises - 08/13/20 0001       Balance Exercises: Standing   Standing Eyes Opened Wide (BOA);Foam/compliant surface;5 reps;Limitations;Narrow base of support (BOS)    Standing Eyes Opened Limitations HEad turns/head nods x 5    Standing Eyes Closed Wide (BOA);Narrow base of support (BOS);Foam/compliant surface;1 rep;10 secs;Limitations    Standing Eyes Closed Limitations Progressed to ECwith head turns/head nods x 5    Tandem Stance Eyes open;Upper extremity support 1;Intermittent upper extremity support;3 reps;15 secs    SLS Eyes open;Solid surface;Upper extremity support 1;3 reps;10 secs    Tandem Gait Forward;Retro;Upper extremity support;Limitations;5 reps    Tandem Gait Limitations Cues to look ahead, not down at feet; 3 laps at counter    Partial Tandem Stance Eyes open;Eyes closed;Foam/compliant surface;Upper extremity support 2;Limitations    Partial Tandem Stance Limitations EO head turns x 5, EC head steady 10 sec    Other Standing Exercises Walking 25 ft distances in gym, multiple reps, with change of speed, head turns, head nods with supervision.  PT Education - 08/13/20 1159     Education Details Updates to HEP-see instructions    Person(s) Educated Patient;Spouse    Methods Explanation;Demonstration;Handout;Verbal cues    Comprehension Verbalized understanding;Returned demonstration;Verbal cues required              PT Short Term Goals - 07/22/20 1836       PT SHORT TERM GOAL #1   Title Pt will perform HEP with family supervision for improved strength, balance, transfers, and gait.    TARGET 08/20/2020    Baseline No current HEP    Time 4    Period Weeks    Status New      PT SHORT TERM GOAL #2   Title Pt will improve 5x sit<>stand to less than or equal to 11.5 sec to demonstrate  improved functional strength and transfer efficiency.    Baseline 13.53 sec    Time 4    Period Weeks    Status New      PT SHORT TERM GOAL #3   Title Pt will improve DGI score to at least 17/24 to decrease fall risk.    Baseline 14/24 at eval    Time 4    Period Weeks    Status New      PT SHORT TERM GOAL #4   Title Pt will verbalize understanding of fall prevention in home environment.    Baseline at fall risk per DGI    Time 4    Period Weeks               PT Long Term Goals - 07/22/20 1839       PT LONG TERM GOAL #1   Title Pt will perform progression of HEP with family supervision for improved strength, balance, transfers, and gait.  TARGET 09/17/2020    Baseline No current HEP    Time 8    Period Weeks    Status New      PT LONG TERM GOAL #2   Title Pt will improve gait velocity to at least 2.8 ft/sec for improved gait efficiency and safety.    Baseline 2.4 ft/sec    Time 8    Period Weeks    Status New      PT LONG TERM GOAL #3   Title Pt will improve DGI score to at least 22/24 to decrease fall risk.    Baseline 14/24    Time 8    Period Weeks    Status New      PT LONG TERM GOAL #4   Title Pt will ambulate at least 1000 ft, indoor and outdoor surfaces, independently, no LOB for improved community gait.    Baseline currently needs supervision for gait    Time 8    Period Weeks    Status New                   Plan - 08/13/20 1200     Clinical Impression Statement Continued skilled PT session today focused on corner balance on compliant surfaces, with head motions/vision removed (eye is feeling much better) and dynamic gait/balance.  He continues to need extra time and cues for technique.  He continues to slow pace of gait with added head motions.  He does require UE support with progressions of standing balance corner exercises.  He will continue to benefit from skilled PT to further address balance, funcitonal strength, gait.    Personal  Factors and Comorbidities Comorbidity 3+  Comorbidities PMH:  DM, HTN, HLD    Examination-Activity Limitations Locomotion Level;Transfers    Examination-Participation Restrictions Community Activity;Occupation    Stability/Clinical Decision Making Evolving/Moderate complexity    Rehab Potential Good    PT Frequency 2x / week    PT Duration 8 weeks   plus eval   PT Treatment/Interventions ADLs/Self Care Home Management;Gait training;Stair training;Functional mobility training;Therapeutic activities;Therapeutic exercise;Balance training;Neuromuscular re-education;Patient/family education    PT Next Visit Plan Review updates to HEP for balance and progress for functional strength; work on dynamic balance and gait; corner balance exercises    Consulted and Agree with Plan of Care Patient;Family member/caregiver    Family Member Consulted [name]             Patient will benefit from skilled therapeutic intervention in order to improve the following deficits and impairments:  Abnormal gait, Difficulty walking, Impaired tone, Decreased balance, Decreased mobility, Decreased strength, Postural dysfunction  Visit Diagnosis: Unsteadiness on feet  Other abnormalities of gait and mobility  Muscle weakness (generalized)     Problem List Patient Active Problem List   Diagnosis Date Noted   AKI (acute kidney injury) (HCC) 07/07/2020   Middle cerebral artery embolism, 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 08/13/2020, 12:03 PM Gean Maidens., PT   [facility] [address] [place], [place], [address] Phone: [phone]   Fax:  [phone]  Name: Melody Savidge MRN: [id number] Date of Birth: 05-Sep-1975

## 2020-08-16 ENCOUNTER — Other Ambulatory Visit (INDEPENDENT_AMBULATORY_CARE_PROVIDER_SITE_OTHER): Payer: Self-pay | Admitting: Primary Care

## 2020-08-16 ENCOUNTER — Encounter: Payer: Self-pay | Admitting: Physical Therapy

## 2020-08-16 ENCOUNTER — Ambulatory Visit: Payer: Medicaid Other | Attending: Primary Care | Admitting: Physical Therapy

## 2020-08-16 ENCOUNTER — Other Ambulatory Visit: Payer: Self-pay

## 2020-08-16 ENCOUNTER — Ambulatory Visit: Payer: Medicaid Other

## 2020-08-16 ENCOUNTER — Ambulatory Visit: Payer: Medicaid Other | Admitting: Occupational Therapy

## 2020-08-16 ENCOUNTER — Encounter: Payer: Self-pay | Admitting: Occupational Therapy

## 2020-08-16 DIAGNOSIS — M6281 Muscle weakness (generalized): Secondary | ICD-10-CM

## 2020-08-16 DIAGNOSIS — R4701 Aphasia: Secondary | ICD-10-CM | POA: Insufficient documentation

## 2020-08-16 DIAGNOSIS — R2681 Unsteadiness on feet: Secondary | ICD-10-CM | POA: Diagnosis not present

## 2020-08-16 DIAGNOSIS — R41842 Visuospatial deficit: Secondary | ICD-10-CM | POA: Insufficient documentation

## 2020-08-16 DIAGNOSIS — R2689 Other abnormalities of gait and mobility: Secondary | ICD-10-CM | POA: Diagnosis present

## 2020-08-16 DIAGNOSIS — R278 Other lack of coordination: Secondary | ICD-10-CM | POA: Diagnosis present

## 2020-08-16 DIAGNOSIS — R482 Apraxia: Secondary | ICD-10-CM

## 2020-08-16 DIAGNOSIS — I69353 Hemiplegia and hemiparesis following cerebral infarction affecting right non-dominant side: Secondary | ICD-10-CM | POA: Diagnosis present

## 2020-08-16 DIAGNOSIS — R41840 Attention and concentration deficit: Secondary | ICD-10-CM

## 2020-08-16 NOTE — Therapy (Signed)
[facility] [address] [place], [place], [address] Phone: [phone]   Fax:  [phone]  Speech Language Pathology Treatment  Patient Details  Name: Nathaniel Spears MRN: [id number] Date of Birth: 08-06-1975 Referring Provider (SLP): Charlton Amor, PA-C   Encounter Date: 08/16/2020   End of Session - 08/16/20 1237     Visit Number 5    Number of Visits 25    Date for SLP Re-Evaluation 10/14/20    Authorization Type self-pay    SLP Start Time 1232    SLP Stop Time  1315    SLP Time Calculation (min) 43 min    Activity Tolerance Patient tolerated treatment well             Past Medical History:  Diagnosis Date   Hypertension    Stroke [facility])     Past Surgical History:  Procedure Laterality Date   BUBBLE STUDY  06/01/2020   Procedure: BUBBLE STUDY;  Surgeon: Little Ishikawa, MD;  Location: [facility] ENDOSCOPY;  Service: Cardiovascular;;   IR ANGIO INTRA EXTRACRAN SEL COM CAROTID INNOMINATE BILAT MOD SED  06/02/2020   IR ANGIO VERTEBRAL SEL SUBCLAVIAN INNOMINATE BILAT MOD SED  06/02/2020   TEE WITHOUT CARDIOVERSION N/A 06/01/2020   Procedure: TRANSESOPHAGEAL ECHOCARDIOGRAM (TEE);  Surgeon: Little Ishikawa, MD;  Location: [facility] ENDOSCOPY;  Service: Cardiovascular;  Laterality: N/A;    There were no vitals filed for this visit.   Subjective Assessment - 08/16/20 1236     Subjective "surprised"    Patient is accompained by: Family member   SO- [name]   Currently in Pain? No/denies                   ADULT SLP TREATMENT - 08/16/20 1237       General Information   Behavior/Cognition Alert;Cooperative;Pleasant mood;Requires cueing      Treatment Provided   Treatment provided Cognitive-Linquistic      Cognitive-Linquistic Treatment   Treatment focused on Apraxia;Aphasia    Skilled Treatment [name] significant other recapped details of weekend surprise. SLP encouraged pt to provide recap  in his own words, in which pt able to ID 1-2 words at a time to describe surprise with usual moderate questioning cues. [name] significant other aware of habitual use of speaking on behalf of patient, with SLP providing encouragement to allow patient to speak for himself first. SLP targeted reading of HEP and short paragraphs. Pt benefited from visual tracking aids, with moderate prompting intermittently required to utilize. Intermittent right neglect also exhibited. Pt able to name items bought at store and ingredients in favorite recipe with intermittent min to mod cues and additional processing time.      Assessment / Recommendations / Plan   Plan Continue with current plan of care      Progression Toward Goals   Progression toward goals Progressing toward goals              SLP Education - 08/16/20 1437     Education Details allow patient to speak for himself with cues as needed, HEP    Person(s) Educated Patient;Caregiver(s)    Methods Explanation;Demonstration;Handout    Comprehension Verbalized understanding;Returned demonstration;Need further instruction              SLP Short Term Goals - 08/16/20 1438       SLP SHORT TERM GOAL #1   Title Pt will name 5-10 words in personally relevant categories with occasional mod A over 2  sessions    Baseline 08-13-20    Time 2    Period Weeks    Status On-going      SLP SHORT TERM GOAL #2   Title Pt will read sentences with ability to correct errors with 80% accuracy given occasional mod A over 2 sessions    Baseline 08-12-20    Time 2    Period Weeks    Status On-going      SLP SHORT TERM GOAL #3   Title Pt will use external visual communication aids to augment verbal expression for communication of wants/needs for 4/5 opportunities with occasional min A over 2 sessions    Time 2    Period Weeks    Status On-going      SLP SHORT TERM GOAL #4   Title Pt will complete picture description tasks by verbalizing at least 2 words  for 8/10 opportunities with occasional mod A over 2 sessions    Time 2    Period Weeks    Status On-going      SLP SHORT TERM GOAL #5   Title Pt will verbalize 2-3 word phrases in response to simple conversational dialogue with usual mod A over 2 sessions    Time 2    Period Weeks    Status On-going              SLP Long Term Goals - 08/16/20 1438       SLP LONG TERM GOAL #1   Title Pt will verbalize 3-4 word sentences in response to simple open ended questions with usual min A over 2 sessions    Time 10    Period Weeks    Status On-going      SLP LONG TERM GOAL #2   Title Pt will use mulitmodal communication system to augment verbal expression to meet needs at home with occasional min A from family over 2 sessions    Time 10    Period Weeks    Status On-going      SLP LONG TERM GOAL #3   Title Pt/caregiver will report improvements in frustration level and communication effectivness via QOL scale by 2 points at last ST session    Baseline CPB: 0    Time 10    Period Weeks    Status On-going              Plan - 08/16/20 1438     Clinical Impression Statement Nathaniel Spears presented for OPST intervention to address apraxia and aphasia s/p multiple strokes in past 6 months. SLP targeted reading and divergent naming of favorite recipes and grocery store items, with occasional min to mod cues required to name items and accurately read with frequent use of visual cues. [name] significant aware of habitual use of speaking for patient, in which SLP provided education to patient and significant other to allow patient to speak first and cue as needed. See "skilled treatment" for additional details of today's session. Skilled ST intervention is warranted to address moderate to severe communication deficits impacting patient's ability to participate in simple conversations and communicate wants and needs effectively.    Speech Therapy Frequency 2x / week    Duration 12 weeks     Treatment/Interventions Compensatory strategies;Cueing hierarchy;Functional tasks;Patient/family education;Cognitive reorganization;Multimodal communcation approach;Environmental controls;Compensatory techniques;Internal/external aids;SLP instruction and feedback;Language facilitation    Potential to Achieve Goals Fair    Potential Considerations Severity of impairments;Previous level of function    SLP Home Exercise Plan provided  Consulted and Agree with Plan of Care Patient;Family member/caregiver    Family Member Consulted [name]             Patient will benefit from skilled therapeutic intervention in order to improve the following deficits and impairments:   Aphasia  Verbal apraxia    Problem List Patient Active Problem List   Diagnosis Date Noted   AKI (acute kidney injury) (HCC) 07/07/2020   Middle cerebral artery embolism, 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   Acute ischemic stroke (HCC) 06/05/2020   Acute left-sided weakness 06/04/2020   Nicotine dependence 06/04/2020   Cocaine use 06/04/2020   Aphasia    Polysubstance abuse (HCC)    Type 2 diabetes mellitus with hyperglycemia, with long-term current use of insulin (HCC)    Hypokalemia    Acute CVA (cerebrovascular accident) (HCC) 04/20/2020   Essential hypertension 08/21/2019   Obesity 08/21/2019    Janann Colonel, MA CCC-SLP 08/16/2020, 2:41 PM  [facility] [address] [place], [place], [address] Phone: [phone]   Fax:  [phone]   Name: Nathaniel Spears MRN: [id number] Date of Birth: [birth year]

## 2020-08-16 NOTE — Therapy (Signed)
[facility] [address] [place], [place], [address] Phone: [phone]   Fax:  [phone]  Physical Therapy Treatment  Patient Details  Name: Nathaniel Spears MRN: [id number] Date of Birth: [birth year] Referring Provider (PT): Raulkar (follow-up)   Encounter Date: 08/16/2020   PT End of Session - 08/16/20 1456     Visit Number 5    Number of Visits 17    Date for PT Re-Evaluation 09/17/20    Authorization Type Self-Pay at eval; Medicaid potential    PT Start Time 1405    PT Stop Time 1445    PT Time Calculation (min) 40 min    Equipment Utilized During Treatment Gait belt    Activity Tolerance Patient tolerated treatment well    Behavior During Therapy [facility] for tasks assessed/performed             Past Medical History:  Diagnosis Date   Hypertension    Stroke [facility])     Past Surgical History:  Procedure Laterality Date   BUBBLE STUDY  06/01/2020   Procedure: BUBBLE STUDY;  Surgeon: Little Ishikawa, MD;  Location: [facility] ENDOSCOPY;  Service: Cardiovascular;;   IR ANGIO INTRA EXTRACRAN SEL COM CAROTID INNOMINATE BILAT MOD SED  06/02/2020   IR ANGIO VERTEBRAL SEL SUBCLAVIAN INNOMINATE BILAT MOD SED  06/02/2020   TEE WITHOUT CARDIOVERSION N/A 06/01/2020   Procedure: TRANSESOPHAGEAL ECHOCARDIOGRAM (TEE);  Surgeon: Little Ishikawa, MD;  Location: [facility] ENDOSCOPY;  Service: Cardiovascular;  Laterality: N/A;    There were no vitals filed for this visit.   Subjective Assessment - 08/16/20 1407     Subjective No changes.  To get the antibiotic eye drops today.  No falls.  Went to a family cookout yesterday.    Patient is accompained by: Family member   significant other, Nathaniel Spears   Patient Stated Goals Pt wants to become more independent.    Currently in Pain? No/denies    Pain Onset Rodney Cruise Adult PT Treatment/Exercise - 08/16/20 0001        Ambulation/Gait   Ambulation/Gait Yes    Ambulation/Gait Assistance 5: Supervision    Ambulation Distance (Feet) 600 Feet   230   Assistive device None    Gait Pattern Step-through pattern;Decreased arm swing - right;Decreased arm swing - left;Wide base of support;Ataxic    Ambulation Surface Level;Indoor    Gait Comments Gait with environmental scanning tasks, looking for objects, pt needs verbal/tactile cues to look to R and L to find objects.  Short distance gait, forward/back/varied speeds, quick stop/starts.                 Balance Exercises - 08/16/20 0001       Balance Exercises: Standing   Standing Eyes Closed Narrow base of support (BOS);Foam/compliant surface;Limitations    SLS with Vectors Solid surface;Upper extremity assist 1;Limitations    SLS with Vectors Limitations Alt step taps to 6" step, x 10, then to 12" step x 5 reps    Heel Raises Both;10 reps    Heel Raises Limitations On Airex    Toe Raise Both;10 reps    Toe Raise Limitations On Airex    Other Standing Exercises STanding on Airex:  marching in place x 10, alt forward kicks x 10, then alt forward step taps x 10,  alt. step taps back x 10, with extra time and cues for technique to start each exercise.    Other Standing Exercises Comments On Airex:  minisquat to up on toes x 10 reps, cues to reinforce technique.             Reviewed additions to HEP from 08/13/2020-Pt requires initial, then occasional cues for technique.  Walking Tandem Stance - 1-2 x daily - 5 x weekly - 1 sets - 5 reps Walking with Head Rotation - 1-2 x daily - 5 x weekly - 1 sets - 3-5 reps Provided written handout for corner balance on pillow/towel             1) Feet apart, EO head turns/nods x 5 reps each             2) Feet apart, EC head steady x 10 sec             3) Feet together, EO head turns/nods x 5 reps each             4) Feet together, EC head steady x 10 sec    PT Short Term Goals - 07/22/20 1836       PT  SHORT TERM GOAL #1   Title Pt will perform HEP with family supervision for improved strength, balance, transfers, and gait.    TARGET 08/20/2020    Baseline No current HEP    Time 4    Period Weeks    Status New      PT SHORT TERM GOAL #2   Title Pt will improve 5x sit<>stand to less than or equal to 11.5 sec to demonstrate improved functional strength and transfer efficiency.    Baseline 13.53 sec    Time 4    Period Weeks    Status New      PT SHORT TERM GOAL #3   Title Pt will improve DGI score to at least 17/24 to decrease fall risk.    Baseline 14/24 at eval    Time 4    Period Weeks    Status New      PT SHORT TERM GOAL #4   Title Pt will verbalize understanding of fall prevention in home environment.    Baseline at fall risk per DGI    Time 4    Period Weeks               PT Long Term Goals - 07/22/20 1839       PT LONG TERM GOAL #1   Title Pt will perform progression of HEP with family supervision for improved strength, balance, transfers, and gait.  TARGET 09/17/2020    Baseline No current HEP    Time 8    Period Weeks    Status New      PT LONG TERM GOAL #2   Title Pt will improve gait velocity to at least 2.8 ft/sec for improved gait efficiency and safety.    Baseline 2.4 ft/sec    Time 8    Period Weeks    Status New      PT LONG TERM GOAL #3   Title Pt will improve DGI score to at least 22/24 to decrease fall risk.    Baseline 14/24    Time 8    Period Weeks    Status New      PT LONG TERM GOAL #4   Title Pt will ambulate at least 1000 ft, indoor and outdoor  surfaces, independently, no LOB for improved community gait.    Baseline currently needs supervision for gait    Time 8    Period Weeks    Status New                   Plan - 08/16/20 1456     Clinical Impression Statement Reviewed updates to HEP given last visit, with pt return demo understanding with minimal cues.  Balance exercises today on compliant surfaces with pt  requiring UE support and extra cues (?motor planning/apraxia); dynamic gait activities with head motions-pt has slowed pace and needs extra cues for head turns to look for objects.  He will continue to benefit from skilled PT to further address gait and balance for improved funcitonal mobility.    Personal Factors and Comorbidities Comorbidity 3+    Comorbidities PMH:  DM, HTN, HLD    Examination-Activity Limitations Locomotion Level;Transfers    Examination-Participation Restrictions Community Activity;Occupation    Stability/Clinical Decision Making Evolving/Moderate complexity    Rehab Potential Good    PT Frequency 2x / week    PT Duration 8 weeks   plus eval   PT Treatment/Interventions ADLs/Self Care Home Management;Gait training;Stair training;Functional mobility training;Therapeutic activities;Therapeutic exercise;Balance training;Neuromuscular re-education;Patient/family education    PT Next Visit Plan Check STGs.  Continue compliant surfaces for balance and progress for functional strength; work on dynamic balance and gait; corner balance exercises    Consulted and Agree with Plan of Care Patient;Family member/caregiver    Family Member Consulted Nathaniel Spears             Patient will benefit from skilled therapeutic intervention in order to improve the following deficits and impairments:  Abnormal gait, Difficulty walking, Impaired tone, Decreased balance, Decreased mobility, Decreased strength, Postural dysfunction  Visit Diagnosis: Unsteadiness on feet  Other abnormalities of gait and mobility     Problem List Patient Active Problem List   Diagnosis Date Noted   AKI (acute kidney injury) (HCC) 07/07/2020   Middle cerebral artery embolism, 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 08/16/2020, 3:01 PM Gean Maidens., PT   [facility] [address] [place], [place], [address] Phone: [phone]   Fax:  [phone]  Name: Nathaniel Spears MRN: [id number] Date of Birth: 04/24/1975

## 2020-08-16 NOTE — Telephone Encounter (Signed)
Sent to PCP to refill  

## 2020-08-17 ENCOUNTER — Other Ambulatory Visit: Payer: Self-pay

## 2020-08-17 ENCOUNTER — Other Ambulatory Visit (INDEPENDENT_AMBULATORY_CARE_PROVIDER_SITE_OTHER): Payer: Self-pay | Admitting: Primary Care

## 2020-08-17 MED ORDER — CLOPIDOGREL BISULFATE 75 MG PO TABS
75.0000 mg | ORAL_TABLET | Freq: Every day | ORAL | 3 refills | Status: DC
  Filled 2020-08-17: qty 30, 30d supply, fill #0
  Filled 2020-09-23 – 2020-10-07 (×2): qty 30, 30d supply, fill #1
  Filled 2020-11-11: qty 30, 30d supply, fill #2

## 2020-08-17 NOTE — Therapy (Signed)
[facility] [address] [place], [place], [address] Phone: [phone]   Fax:  [phone]  Occupational Therapy Treatment  Patient Details  Name: Nathaniel Spears MRN: [id number] Date of Birth: 1975-06-10 Referring Provider (OT): Mariam Dollar PA-C   Encounter Date: 08/16/2020   OT End of Session - 08/16/20 1403     Visit Number 3    Number of Visits 17    Date for OT Re-Evaluation 11/03/20    Authorization Type Self Pay - did instruct in financial aid    OT Start Time 1318    OT Stop Time 1403    OT Time Calculation (min) 45 min    Activity Tolerance Patient tolerated treatment well    Behavior During Therapy [facility] for tasks assessed/performed             Past Medical History:  Diagnosis Date   Hypertension    Stroke [facility])     Past Surgical History:  Procedure Laterality Date   BUBBLE STUDY  06/01/2020   Procedure: BUBBLE STUDY;  Surgeon: Little Ishikawa, MD;  Location: [facility] ENDOSCOPY;  Service: Cardiovascular;;   IR ANGIO INTRA EXTRACRAN SEL COM CAROTID INNOMINATE BILAT MOD SED  06/02/2020   IR ANGIO VERTEBRAL SEL SUBCLAVIAN INNOMINATE BILAT MOD SED  06/02/2020   TEE WITHOUT CARDIOVERSION N/A 06/01/2020   Procedure: TRANSESOPHAGEAL ECHOCARDIOGRAM (TEE);  Surgeon: Little Ishikawa, MD;  Location: [facility] ENDOSCOPY;  Service: Cardiovascular;  Laterality: N/A;    There were no vitals filed for this visit.   Subjective Assessment - 08/16/20 1317     Subjective  Pt reports his eye is feeling much better; he had it flushed at urgent care last week and is picking up the antibiotic after therapy today    Patient is accompanied by: Family member   [name] (girlfriend)   Pertinent History admitted on 06/15/2020 with c/o aphasia, with multiple recent admits for repeated strokes. Pt most recently admitted 5/20-5/23 for acute strokes before being transferred at family request to [facility] for further work-up. While in  the ED, pt had worsening aphasia compared to baseline. MRI reveals several new or increased acute infarcts are  demonstrated in both cerebral hemispheres (today the bilateral MCA). PMH: HTN, T2DM (5 stokes since 04/20/20)    Limitations aphasia, apraxia, no driving    Patient Stated Goals "to be able to talk"    Currently in Pain? No/denies             Treatment/Exercises - 08/16/20    Coordination Activities Rotating ball in fingertips (clockwise/counter-clockwise), tossing/catching ball w/ same hand, flipping cards over off of deck, dealing cards w/ thumb, rotating card in-hand (clockwise/counter-clockwise), and picking up coins and translating to fingertips to place in stack; min-mod cueing for initiation of tasks/transitioning between activities    Visual Perception Simple PVC pipe tree pattern completed requiring max verbal and visual cues and extended processing time for success. Pt demo'd difficulty w/ identifying and recalling pieces, spatial orientation, and working/visual memory  Copying pattern onto pegboard w/ golf tee pegs to facilitate FMC, visual perception, alternating attention, and intrinsic hand strengthening. Pt able to complete activity w/ mod cues for orientation and initial modeling to facilitate understanding of activity    Visual Scanning Simple visual scanning activity w/ brightly colored line positioned on R side of page as a visual cue; 25% accuracy, locating 6/24 targets all on left 1/3rd of page            OT  Education - 08/16/20 1414     Education Details Reviewed bilateral coordination HEP (see pt instructions) and putty HEP for R hand    Person(s) Educated Patient;Spouse    Methods Explanation;Demonstration;Verbal cues;Handout    Comprehension Verbalized understanding;Need further instruction;Returned demonstration             OT Short Term Goals - 08/12/20 1020       OT SHORT TERM GOAL #1   Title Independent with bilateral coordination HEP and  putty HEP for Rt hand    Time 4    Period Weeks    Status On-going      OT SHORT TERM GOAL #2   Title Pt will perform environmental scanning with 75% accuracy    Time 4    Period Weeks    Status New      OT SHORT TERM GOAL #3   Title Pt will perform simple meal prep (cold and/or microwaveable) and light housekeeping with min A and good safety awareness    Time 4    Period Weeks    Status New      OT SHORT TERM GOAL #4   Title Pt to demo hooking/unhooking buttons on pants consistently    Time 4    Period Weeks    Status New      OT SHORT TERM GOAL #5   Title Pt to cut food with A/E prn safely    Time 4    Period Weeks    Status New             OT Long Term Goals - 08/03/20 1006       OT LONG TERM GOAL #1   Title Pt will verbalize understanding of adapted strategies for increasing independence and safety awareness with ADLs and IADLs (i.e. right inattention for cooking, laundry, motor planning for IADLs)    Time 8    Period Weeks    Status New      OT LONG TERM GOAL #2   Title Pt to increase grip strength Rt hand to 50 lbs or greater    Baseline 44.9 lbs    Time 8    Period Weeks    Status New      OT LONG TERM GOAL #3   Title Pt will perform simple warm meal prep and light housekeeping with supervision and good safety awareness.    Time 8    Period Weeks    Status New      OT LONG TERM GOAL #4   Title Pt will improve coordination bilaterally by 10 sec or greater    Baseline Rt = 44.13 sec, Lt = 42.07 sec    Time 8    Period Weeks    Status New             Plan - 08/16/20 1408     Clinical Impression Statement Pt did well w/ coordination activities this session, requiring cues only for initiation and transition between tasks. Pt demo'd decreased success and increased frustration w/ visual perceptual activities (PVC pipe tree and pegboard). Due to observed difficulty and considering visual perceptual hierarchy, OT completed tabletop symbol  cancellation activity to assess visual scanning w/ pt exhibiting notable limitations and L-sided inattention, correctly locating 25% of targets all on L side of page.    OT Occupational Profile and History Detailed Assessment- Review of Records and additional review of physical, cognitive, psychosocial history related to current functional performance    Occupational performance  deficits (Please refer to evaluation for details): ADL's;IADL's;Leisure;Social Participation    Body Structure / Function / Physical Skills ADL;Coordination;Vision;IADL;UE functional use;Proprioception;Strength;Balance;Dexterity;Mobility;FMC    Cognitive Skills Attention;Sequencing;Perception;Understand;Memory;Problem Solve;Safety Awareness   difficult to assess formally due to aphasia and apraxia   Rehab Potential Good    Comorbidities Affecting Occupational Performance: May have comorbidities impacting occupational performance    Modification or Assistance to Complete Evaluation  Min-Moderate modification of tasks or assist with assess necessary to complete eval    OT Frequency 2x / week    OT Duration 8 weeks   OR 16 visits over 12 weeks due to scheduling conflicts (possibly only being seen 1x/wk some weeks)   OT Treatment/Interventions Self-care/ADL training;Cognitive remediation/compensation;Visual/perceptual remediation/compensation;Patient/family education;Therapeutic activities;Therapeutic exercise;Functional Mobility Training;DME and/or AE instruction;Neuromuscular education;Manual Therapy    Plan simulating cutting food, progress towards remaining STG's; continue w/ visual scanning/visual perception    Consulted and Agree with Plan of Care Patient;Family member/caregiver    Family Member Consulted significant other, [name]             Patient will benefit from skilled therapeutic intervention in order to improve the following deficits and impairments:   Body Structure / Function / Physical Skills: ADL,  Coordination, Vision, IADL, UE functional use, Proprioception, Strength, Balance, Dexterity, Mobility, FMC Cognitive Skills: Attention, Sequencing, Perception, Understand, Memory, Problem Solve, Safety Awareness (difficult to assess formally due to aphasia and apraxia)   Visit Diagnosis: 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)  Apraxia  Visuospatial deficit  Attention and concentration deficit  Muscle weakness (generalized)    Problem List Patient Active Problem List   Diagnosis Date Noted   AKI (acute kidney injury) (HCC) 07/07/2020   Middle cerebral artery embolism, 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 [place], [place], [address] Phone: [phone]   Fax:  [phone]  Name: Koleson Reifsteck MRN: [id number] Date of Birth: 1975-05-08

## 2020-08-18 ENCOUNTER — Encounter (INDEPENDENT_AMBULATORY_CARE_PROVIDER_SITE_OTHER): Payer: Self-pay | Admitting: Primary Care

## 2020-08-18 ENCOUNTER — Other Ambulatory Visit: Payer: Self-pay

## 2020-08-20 ENCOUNTER — Encounter: Payer: Self-pay | Admitting: Physical Therapy

## 2020-08-20 ENCOUNTER — Encounter: Payer: Self-pay | Admitting: Occupational Therapy

## 2020-08-20 ENCOUNTER — Ambulatory Visit: Payer: Medicaid Other | Admitting: Physical Therapy

## 2020-08-20 ENCOUNTER — Other Ambulatory Visit: Payer: Self-pay

## 2020-08-20 ENCOUNTER — Ambulatory Visit: Payer: Medicaid Other

## 2020-08-20 ENCOUNTER — Ambulatory Visit: Payer: Medicaid Other | Admitting: Occupational Therapy

## 2020-08-20 DIAGNOSIS — R41840 Attention and concentration deficit: Secondary | ICD-10-CM

## 2020-08-20 DIAGNOSIS — R2689 Other abnormalities of gait and mobility: Secondary | ICD-10-CM

## 2020-08-20 DIAGNOSIS — R482 Apraxia: Secondary | ICD-10-CM

## 2020-08-20 DIAGNOSIS — R2681 Unsteadiness on feet: Secondary | ICD-10-CM

## 2020-08-20 DIAGNOSIS — R41842 Visuospatial deficit: Secondary | ICD-10-CM

## 2020-08-20 DIAGNOSIS — I69353 Hemiplegia and hemiparesis following cerebral infarction affecting right non-dominant side: Secondary | ICD-10-CM

## 2020-08-20 DIAGNOSIS — M6281 Muscle weakness (generalized): Secondary | ICD-10-CM

## 2020-08-20 DIAGNOSIS — R4701 Aphasia: Secondary | ICD-10-CM

## 2020-08-20 DIAGNOSIS — R278 Other lack of coordination: Secondary | ICD-10-CM

## 2020-08-20 NOTE — Therapy (Signed)
[facility] [address] [place], [place], [address] Phone: [phone]   Fax:  [phone]  Speech Language Pathology Treatment  Patient Details  Name: Nathaniel Spears MRN: [id number] Date of Birth: 1975-03-05 Referring Provider (SLP): Charlton Amor, PA-C   Encounter Date: 08/20/2020   End of Session - 08/20/20 1652     Visit Number 6    Number of Visits 25    Date for SLP Re-Evaluation 10/14/20    Authorization Type self-pay    SLP Start Time 1446    SLP Stop Time  1530    SLP Time Calculation (min) 44 min    Activity Tolerance Patient tolerated treatment well             Past Medical History:  Diagnosis Date   Hypertension    Stroke [facility])     Past Surgical History:  Procedure Laterality Date   BUBBLE STUDY  06/01/2020   Procedure: BUBBLE STUDY;  Surgeon: Little Ishikawa, MD;  Location: [facility] ENDOSCOPY;  Service: Cardiovascular;;   IR ANGIO INTRA EXTRACRAN SEL COM CAROTID INNOMINATE BILAT MOD SED  06/02/2020   IR ANGIO VERTEBRAL SEL SUBCLAVIAN INNOMINATE BILAT MOD SED  06/02/2020   TEE WITHOUT CARDIOVERSION N/A 06/01/2020   Procedure: TRANSESOPHAGEAL ECHOCARDIOGRAM (TEE);  Surgeon: Little Ishikawa, MD;  Location: [facility] ENDOSCOPY;  Service: Cardiovascular;  Laterality: N/A;    There were no vitals filed for this visit.   Subjective Assessment - 08/20/20 1454     Subjective "He's tired. We didn't sleep." (daughter's dog whined all night)    Currently in Pain? No/denies                   ADULT SLP TREATMENT - 08/20/20 1458       General Information   Behavior/Cognition Alert;Cooperative;Pleasant mood;Requires cueing      Treatment Provided   Treatment provided Cognitive-Linquistic      Cognitive-Linquistic Treatment   Treatment focused on Aphasia;Apraxia    Skilled Treatment Pt somnolent at beginning of session with structured speech tass, provided opposites with 48%  success, improved to 88% with mod-max phonemic and semantic cues. SLP eduated pt and Nathaniel Spears with VNeST today. SLP printed out instructions and instructed Nathaniel Spears as SLP worked with pt with verb "mix". Shaan able to think of 3 objects with exta time and subjets with min-mod A and extra time. Nathaniel Spears very appreciative and said pt/she will do one every day.      Assessment / Recommendations / Plan   Plan Continue with current plan of care      Progression Toward Goals   Progression toward goals Progressing toward goals              SLP Education - 08/20/20 1651     Education Details VNeST techique    Person(s) Educated Patient;Caregiver(s)    Methods Explanation;Demonstration;Verbal cues;Handout    Comprehension Verbalized understanding;Returned demonstration;Verbal cues required;Need further instruction              SLP Short Term Goals - 08/20/20 1652       SLP SHORT TERM GOAL #1   Title Pt will name 5-10 words in personally relevant categories with occasional mod A over 2 sessions    Baseline 08-13-20    Time 2    Period Weeks    Status On-going      SLP SHORT TERM GOAL #2   Title Pt will read sentences with ability to correct errors with  80% accuracy given occasional mod A over 2 sessions    Baseline 08-12-20, 08-20-20    Time 2    Period Weeks    Status On-going      SLP SHORT TERM GOAL #3   Title Pt will use external visual communication aids to augment verbal expression for communication of wants/needs for 4/5 opportunities with occasional min A over 2 sessions    Time 2    Period Weeks    Status On-going      SLP SHORT TERM GOAL #4   Title Pt will complete picture description tasks by verbalizing at least 2 words for 8/10 opportunities with occasional mod A over 2 sessions    Time 2    Period Weeks    Status On-going      SLP SHORT TERM GOAL #5   Title Pt will verbalize 2-3 word phrases in response to simple conversational dialogue with usual mod A over 2  sessions    Time 2    Period Weeks    Status On-going              SLP Long Term Goals - 08/20/20 1653       SLP LONG TERM GOAL #1   Title Pt will verbalize 3-4 word sentences in response to simple open ended questions with usual min A over 2 sessions    Time 10    Period Weeks    Status On-going      SLP LONG TERM GOAL #2   Title Pt will use mulitmodal communication system to augment verbal expression to meet needs at home with occasional min A from family over 2 sessions    Time 10    Period Weeks    Status On-going      SLP LONG TERM GOAL #3   Title Pt/caregiver will report improvements in frustration level and communication effectivness via QOL scale by 2 points at last ST session    Baseline CPB: 0    Time 10    Period Weeks    Status On-going              Plan - 08/20/20 1652     Clinical Impression Statement Nathaniel Spears presented for OPST intervention to address apraxia and aphasia s/p multiple strokes in past 6 months. SLP targeted reading and divergent naming of favorite recipes and grocery store items, with occasional min to mod cues required to name items and accurately read with frequent use of visual cues. [name] significant aware of habitual use of speaking for patient, in which SLP provided education to patient and significant other to allow patient to speak first and cue as needed. See "skilled treatment" for additional details of today's session. Skilled ST intervention is warranted to address moderate to severe communication deficits impacting patient's ability to participate in simple conversations and communicate wants and needs effectively.    Speech Therapy Frequency 2x / week    Duration 12 weeks    Treatment/Interventions Compensatory strategies;Cueing hierarchy;Functional tasks;Patient/family education;Cognitive reorganization;Multimodal communcation approach;Environmental controls;Compensatory techniques;Internal/external aids;SLP instruction and  feedback;Language facilitation    Potential to Achieve Goals Fair    Potential Considerations Severity of impairments;Previous level of function    SLP Home Exercise Plan provided    Consulted and Agree with Plan of Care Patient;Family member/caregiver    Family Member Consulted Nathaniel Spears             Patient will benefit from skilled therapeutic intervention in order to improve the following  deficits and impairments:   Aphasia  Verbal apraxia    Problem List Patient Active Problem List   Diagnosis Date Noted   AKI (acute kidney injury) (HCC) 07/07/2020   Middle cerebral artery embolism, 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 [address] [place], [place], [address] Phone: [phone]   Fax:  [phone]   Name: Nathaniel Spears MRN: [id number] Date of Birth: 12-06-1975

## 2020-08-20 NOTE — Therapy (Signed)
[facility] [address] [place], [place], [address] Phone: [phone]   Fax:  [phone]  Occupational Therapy Treatment  Patient Details  Name: Nathaniel Spears MRN: [id number] Date of Birth: [birth year] Referring Provider (OT): Mariam Dollar PA-C   Encounter Date: 08/20/2020   OT End of Session - 08/20/20 1404     Visit Number 4    Number of Visits 17    Date for OT Re-Evaluation 11/03/20    Authorization Type Self Pay - did instruct in financial aid    OT Start Time 1402    OT Stop Time 1445    OT Time Calculation (min) 43 min    Activity Tolerance Patient tolerated treatment well    Behavior During Therapy [facility] for tasks assessed/performed             Past Medical History:  Diagnosis Date   Hypertension    Stroke [facility])     Past Surgical History:  Procedure Laterality Date   BUBBLE STUDY  06/01/2020   Procedure: BUBBLE STUDY;  Surgeon: Little Ishikawa, MD;  Location: [facility] ENDOSCOPY;  Service: Cardiovascular;;   IR ANGIO INTRA EXTRACRAN SEL COM CAROTID INNOMINATE BILAT MOD SED  06/02/2020   IR ANGIO VERTEBRAL SEL SUBCLAVIAN INNOMINATE BILAT MOD SED  06/02/2020   TEE WITHOUT CARDIOVERSION N/A 06/01/2020   Procedure: TRANSESOPHAGEAL ECHOCARDIOGRAM (TEE);  Surgeon: Little Ishikawa, MD;  Location: [facility] ENDOSCOPY;  Service: Cardiovascular;  Laterality: N/A;    There were no vitals filed for this visit.   Subjective Assessment - 08/20/20 1404     Subjective  "got a haircut today" (per S.O.) Spouse also reports PT says his balance is getting a lot better.    Patient is accompanied by: Family member   [name] (girlfriend)   Pertinent History admitted on 06/15/2020 with c/o aphasia, with multiple recent admits for repeated strokes. Pt most recently admitted 5/20-5/23 for acute strokes before being transferred at family request to [facility] for further work-up. While in the ED, pt had worsening aphasia  compared to baseline. MRI reveals several new or increased acute infarcts are  demonstrated in both cerebral hemispheres (today the bilateral MCA). PMH: HTN, T2DM (5 stokes since 04/20/20)    Limitations aphasia, apraxia, no driving    Patient Stated Goals "to be able to talk"    Currently in Pain? No/denies               Worked on visual scanning and attention today.  Perfection pieces with scanning board. Pt required max cues for scanning to the right and attending to entire board. Pt did not have any difficulty with visual perceptual skills but struggled with attention and scanning to the right.   Sorting Playing Cards with emphasis on scanning entire table L>R, Pt did better with this task with scanning to the right but continued to require mod cues for scanning and attention to the right.                     OT Short Term Goals - 08/20/20 1406       OT SHORT TERM GOAL #1   Title Independent with bilateral coordination HEP and putty HEP for Rt hand    Time 4    Period Weeks    Status On-going      OT SHORT TERM GOAL #2   Title Pt will perform environmental scanning with 75% accuracy    Time 4  Period Weeks    Status New      OT SHORT TERM GOAL #3   Title Pt will perform simple meal prep (cold and/or microwaveable) and light housekeeping with min A and good safety awareness    Time 4    Period Weeks    Status New      OT SHORT TERM GOAL #4   Title Pt to demo hooking/unhooking buttons on pants consistently    Time 4    Period Weeks    Status Achieved      OT SHORT TERM GOAL #5   Title Pt to cut food with A/E prn safely    Time 4    Period Weeks    Status On-going               OT Long Term Goals - 08/03/20 1006       OT LONG TERM GOAL #1   Title Pt will verbalize understanding of adapted strategies for increasing independence and safety awareness with ADLs and IADLs (i.e. right inattention for cooking, laundry, motor planning for  IADLs)    Time 8    Period Weeks    Status New      OT LONG TERM GOAL #2   Title Pt to increase grip strength Rt hand to 50 lbs or greater    Baseline 44.9 lbs    Time 8    Period Weeks    Status New      OT LONG TERM GOAL #3   Title Pt will perform simple warm meal prep and light housekeeping with supervision and good safety awareness.    Time 8    Period Weeks    Status New      OT LONG TERM GOAL #4   Title Pt will improve coordination bilaterally by 10 sec or greater    Baseline Rt = 44.13 sec, Lt = 42.07 sec    Time 8    Period Weeks    Status New                   Plan - 08/20/20 1449     Clinical Impression Statement Pt with continued R side inattention with max cues required for scanning to right of task. Pt encouraged to take frequent breaks to break up repeptitive attempts and refresh.    OT Occupational Profile and History Detailed Assessment- Review of Records and additional review of physical, cognitive, psychosocial history related to current functional performance    Occupational performance deficits (Please refer to evaluation for details): ADL's;IADL's;Leisure;Social Participation    Body Structure / Function / Physical Skills ADL;Coordination;Vision;IADL;UE functional use;Proprioception;Strength;Balance;Dexterity;Mobility;FMC    Cognitive Skills Attention;Sequencing;Perception;Understand;Memory;Problem Solve;Safety Awareness   difficult to assess formally due to aphasia and apraxia   Rehab Potential Good    Comorbidities Affecting Occupational Performance: May have comorbidities impacting occupational performance    Modification or Assistance to Complete Evaluation  Min-Moderate modification of tasks or assist with assess necessary to complete eval    OT Frequency 2x / week    OT Duration 8 weeks   OR 16 visits over 12 weeks due to scheduling conflicts (possibly only being seen 1x/wk some weeks)   OT Treatment/Interventions Self-care/ADL  training;Cognitive remediation/compensation;Visual/perceptual remediation/compensation;Patient/family education;Therapeutic activities;Therapeutic exercise;Functional Mobility Training;DME and/or AE instruction;Neuromuscular education;Manual Therapy    Plan simulating cutting food, progress towards remaining STG's; continue w/ visual scanning/visual perception    Consulted and Agree with Plan of Care Patient;Family member/caregiver    Family Member  Consulted significant other, [name]             Patient will benefit from skilled therapeutic intervention in order to improve the following deficits and impairments:   Body Structure / Function / Physical Skills: ADL, Coordination, Vision, IADL, UE functional use, Proprioception, Strength, Balance, Dexterity, Mobility, FMC Cognitive Skills: Attention, Sequencing, Perception, Understand, Memory, Problem Solve, Safety Awareness (difficult to assess formally due to aphasia and apraxia)     Visit Diagnosis: 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)  Visuospatial deficit  Attention and concentration deficit  Muscle weakness (generalized)  Unsteadiness on feet  Other lack of coordination    Problem List Patient Active Problem List   Diagnosis Date Noted   AKI (acute kidney injury) (HCC) 07/07/2020   Middle cerebral artery embolism, 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 [place], [place], [address] Phone: [phone]   Fax:  [phone]  Name: Nathaniel Spears MRN: [id number] Date of Birth: [birth year]

## 2020-08-20 NOTE — Patient Instructions (Signed)
VNeST technique:   1) Choose a common verb from the list provided.   2) Think of 3 people (or "subjects") who could perform the action, or start by thinking of 3 objects the action could be done to. It might be easier to work on one complete set at a time, thinking of the object and person/subject together. The goal is to be as specific as possible with the nouns, so "farmer" would be a better subject than "man" for the verb drive. Write each subject and each object down on the paper in the appropriate column so you'll have 3 triads (subject-verb-object). It's okay to get personal. Family members, friends, and pets make great subjects for VNeST! Just try to vary the responses, so not all the nouns are personal or there is just one type of object. Similarly, try to use many different meanings of the verb if possible.   3) Read each triad of words aloud when you write them down on the lines below the chart. It's not important to conjugate the verb or add any articles to the nouns ("farmer drive tractor"), but it's okay if you do ("the farmer drives the tractor").   4) Select one of the three triads to expand upon. Ask "WHERE" it happens, "WHY" it happens, and "WHEN" it happens. Try to get as specific as possible, and write the responses down on the last line following the triad you chose to expand. Then, read the expanded sentence with the three answers to the questions. Again, the grammar doesn't matter as much as the focus is on connecting the concepts.  

## 2020-08-20 NOTE — Therapy (Signed)
[facility] [address] [place], [place], [address] Phone: [phone]   Fax:  [phone]  Physical Therapy Treatment  Patient Details  Name: Nathaniel Spears MRN: [id number] Date of Birth: 1975/07/11 Referring Provider (PT): Raulkar (follow-up)   Encounter Date: 08/20/2020   PT End of Session - 08/20/20 1445     Visit Number 6    Number of Visits 17    Date for PT Re-Evaluation 09/17/20    Authorization Type Self-Pay at eval; Medicaid potential    PT Start Time 1320    PT Stop Time 1400    PT Time Calculation (min) 40 min    Activity Tolerance Patient tolerated treatment well   Extra time and cues due to aphasia/apraxia(?)   Behavior During Therapy [facility] for tasks assessed/performed             Past Medical History:  Diagnosis Date   Hypertension    Stroke [facility])     Past Surgical History:  Procedure Laterality Date   BUBBLE STUDY  06/01/2020   Procedure: BUBBLE STUDY;  Surgeon: Little Ishikawa, MD;  Location: [facility] ENDOSCOPY;  Service: Cardiovascular;;   IR ANGIO INTRA EXTRACRAN SEL COM CAROTID INNOMINATE BILAT MOD SED  06/02/2020   IR ANGIO VERTEBRAL SEL SUBCLAVIAN INNOMINATE BILAT MOD SED  06/02/2020   TEE WITHOUT CARDIOVERSION N/A 06/01/2020   Procedure: TRANSESOPHAGEAL ECHOCARDIOGRAM (TEE);  Surgeon: Little Ishikawa, MD;  Location: [facility] ENDOSCOPY;  Service: Cardiovascular;  Laterality: N/A;    There were no vitals filed for this visit.   Subjective Assessment - 08/20/20 1324     Subjective No changes, no falls.  Exercises going well.    Patient is accompained by: Family member   significant other, [name]   Patient Stated Goals Pt wants to become more independent.    Currently in Pain? No/denies    Pain Onset [facility] Adult PT Treatment/Exercise - 08/20/20 0001       Ambulation/Gait   Ambulation/Gait Yes    Ambulation/Gait  Assistance 5: Supervision    Assistive device None    Gait Pattern Step-through pattern;Decreased arm swing - right;Decreased arm swing - left;Wide base of support;Ataxic    Ambulation Surface Level;Indoor    Gait Comments 25 ft x 2 of gait with head turns, then 25 ft x 2 with head nods.  Slowed pace, but no LOB.      Standardized Balance Assessment   Standardized Balance Assessment Dynamic Gait Index      Dynamic Gait Index   Level Surface Mild Impairment    Change in Gait Speed Normal    Gait with Horizontal Head Turns Mild Impairment    Gait with Vertical Head Turns Mild Impairment    Gait and Pivot Turn Normal    Step Over Obstacle Moderate Impairment    Step Around Obstacles Mild Impairment    Steps Mild Impairment    Total Score 17                 Balance Exercises - 08/20/20 0001       Balance Exercises: Standing   Standing Eyes Opened Wide (BOA);Foam/compliant surface;5 reps;Limitations;Narrow base of support (BOS)    Standing Eyes Opened Limitations HEad turns/head nods x 10    Standing Eyes Closed Narrow base of support (BOS);Foam/compliant  surface;Limitations;Wide (BOA)    Standing Eyes Closed Limitations Head turns/nods x 10    Partial Tandem Stance Eyes open;Eyes closed;Foam/compliant surface;Upper extremity support 2;Limitations    Partial Tandem Stance Limitations EO head turns/nods  x 10, EC head steady 10 sec    Heel Raises Both;20 reps    Heel Raises Limitations On Airex    Toe Raise Both;20 reps    Toe Raise Limitations On Airex    Other Standing Exercises STanding on Airex:  marching in place x 10, alt forward kicks x 10, then alt forward step taps x 10, with extra time and cues for technique to start each exercise.    Other Standing Exercises Comments On solid surface: minisquat x 10 reps, then attempted minisquats to up on toes x 10 reps, extra times and cues to reinforce technique.  Attempted minisquat with weigthshift to lift one side for dynamic  single limb stance, but pt has difficulty sequencing.               PT Education - 08/20/20 1445     Education Details Discussed fall prevention; discussed POC and progress towards goals    Person(s) Educated Patient;Spouse    Methods Explanation    Comprehension Verbalized understanding              PT Short Term Goals - 08/20/20 1325       PT SHORT TERM GOAL #1   Title Pt will perform HEP with family supervision for improved strength, balance, transfers, and gait.    TARGET 08/20/2020    Baseline No current HEP    Time 4    Period Weeks    Status Achieved      PT SHORT TERM GOAL #2   Title Pt will improve 5x sit<>stand to less than or equal to 11.5 sec to demonstrate improved functional strength and transfer efficiency.    Baseline 13.53 sec; 8/522:  13.10 1st trial; 10.66 sec    Time 4    Period Weeks    Status Achieved      PT SHORT TERM GOAL #3   Title Pt will improve DGI score to at least 17/24 to decrease fall risk.    Baseline 14/24 at eval; 08/20/20:  17/24    Time 4    Period Weeks    Status Achieved      PT SHORT TERM GOAL #4   Title Pt will verbalize understanding of fall prevention in home environment.    Baseline at fall risk per DGI    Time 4    Period Weeks    Status Achieved               PT Long Term Goals - 07/22/20 1839       PT LONG TERM GOAL #1   Title Pt will perform progression of HEP with family supervision for improved strength, balance, transfers, and gait.  TARGET 09/17/2020    Baseline No current HEP    Time 8    Period Weeks    Status New      PT LONG TERM GOAL #2   Title Pt will improve gait velocity to at least 2.8 ft/sec for improved gait efficiency and safety.    Baseline 2.4 ft/sec    Time 8    Period Weeks    Status New      PT LONG TERM GOAL #3   Title Pt will improve DGI score to at least 22/24 to decrease fall  risk.    Baseline 14/24    Time 8    Period Weeks    Status New      PT LONG TERM GOAL #4    Title Pt will ambulate at least 1000 ft, indoor and outdoor surfaces, independently, no LOB for improved community gait.    Baseline currently needs supervision for gait    Time 8    Period Weeks    Status New                   Plan - 08/20/20 1447     Clinical Impression Statement Assessed STGs this visit, with pt meeting all 4 of 4 STGs.  Pt has demonstrated improved functional strength as well as improve dynamic balance.  He does continue to be at fall risk per DGI score of 17/24.  He demonstrates slowed pace with gait with head motions and on compliant surfaces, he tends to use visual cues by looking at the ground.  He will continue to benefit from skilled PT to address balance, fucntional strength, and dynamic gait for imrpoved overall functional mobility and independence.    Personal Factors and Comorbidities Comorbidity 3+    Comorbidities PMH:  DM, HTN, HLD    Examination-Activity Limitations Locomotion Level;Transfers    Examination-Participation Restrictions Community Activity;Occupation    Stability/Clinical Decision Making Evolving/Moderate complexity    Rehab Potential Good    PT Frequency 2x / week    PT Duration 8 weeks   plus eval   PT Treatment/Interventions ADLs/Self Care Home Management;Gait training;Stair training;Functional mobility training;Therapeutic activities;Therapeutic exercise;Balance training;Neuromuscular re-education;Patient/family education    PT Next Visit Plan Be aware that pt requires extra time and cues for demo of exercises/activities.  Single limb standing balance exercises;  Continue compliant surfaces for balance and progress for functional strength; work on dynamic balance and gait; corner balance exercises    Consulted and Agree with Plan of Care Patient;Family member/caregiver    Family Member Consulted [name]             Patient will benefit from skilled therapeutic intervention in order to improve the following deficits and  impairments:  Abnormal gait, Difficulty walking, Impaired tone, Decreased balance, Decreased mobility, Decreased strength, Postural dysfunction  Visit Diagnosis: Unsteadiness on feet  Other abnormalities of gait and mobility     Problem List Patient Active Problem List   Diagnosis Date Noted   AKI (acute kidney injury) (HCC) 07/07/2020   Middle cerebral artery embolism, 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 08/20/2020, 2:50 PM Gean Maidens., PT  [facility] [address] [place], [place], [address] Phone: [phone]   Fax:  [phone]  Name: Nathaniel Spears MRN: [id number] Date of Birth: 1975/10/24

## 2020-08-25 ENCOUNTER — Other Ambulatory Visit: Payer: Self-pay

## 2020-08-25 ENCOUNTER — Ambulatory Visit: Payer: Medicaid Other | Admitting: Occupational Therapy

## 2020-08-25 ENCOUNTER — Encounter: Payer: Self-pay | Admitting: Physical Therapy

## 2020-08-25 ENCOUNTER — Ambulatory Visit: Payer: Medicaid Other

## 2020-08-25 ENCOUNTER — Ambulatory Visit: Payer: Medicaid Other | Admitting: Physical Therapy

## 2020-08-25 DIAGNOSIS — R278 Other lack of coordination: Secondary | ICD-10-CM

## 2020-08-25 DIAGNOSIS — R41840 Attention and concentration deficit: Secondary | ICD-10-CM

## 2020-08-25 DIAGNOSIS — I69353 Hemiplegia and hemiparesis following cerebral infarction affecting right non-dominant side: Secondary | ICD-10-CM

## 2020-08-25 DIAGNOSIS — R2681 Unsteadiness on feet: Secondary | ICD-10-CM

## 2020-08-25 DIAGNOSIS — R41842 Visuospatial deficit: Secondary | ICD-10-CM

## 2020-08-25 DIAGNOSIS — M6281 Muscle weakness (generalized): Secondary | ICD-10-CM

## 2020-08-25 DIAGNOSIS — R482 Apraxia: Secondary | ICD-10-CM

## 2020-08-25 DIAGNOSIS — R2689 Other abnormalities of gait and mobility: Secondary | ICD-10-CM

## 2020-08-25 DIAGNOSIS — R4701 Aphasia: Secondary | ICD-10-CM

## 2020-08-25 NOTE — Therapy (Signed)
[facility] [address] [place], [place], [address] Phone: [phone]   Fax:  [phone]  Physical Therapy Treatment  Patient Details  Name: Nathaniel Spears MRN: [id number] Date of Birth: Dec 06, 1975 Referring Provider (PT): Raulkar (follow-up)   Encounter Date: 08/25/2020   PT End of Session - 08/25/20 1019     Visit Number 7    Number of Visits 17    Date for PT Re-Evaluation 09/17/20    Authorization Type Self-Pay at eval; Medicaid potential    PT Start Time 1016    PT Stop Time 1100    PT Time Calculation (min) 44 min    Equipment Utilized During Treatment Gait belt    Activity Tolerance Patient tolerated treatment well   Extra time and cues due to aphasia/apraxia(?)   Behavior During Therapy [facility] for tasks assessed/performed             Past Medical History:  Diagnosis Date   Hypertension    Stroke [facility])     Past Surgical History:  Procedure Laterality Date   BUBBLE STUDY  06/01/2020   Procedure: BUBBLE STUDY;  Surgeon: Little Ishikawa, MD;  Location: [facility] ENDOSCOPY;  Service: Cardiovascular;;   IR ANGIO INTRA EXTRACRAN SEL COM CAROTID INNOMINATE BILAT MOD SED  06/02/2020   IR ANGIO VERTEBRAL SEL SUBCLAVIAN INNOMINATE BILAT MOD SED  06/02/2020   TEE WITHOUT CARDIOVERSION N/A 06/01/2020   Procedure: TRANSESOPHAGEAL ECHOCARDIOGRAM (TEE);  Surgeon: Little Ishikawa, MD;  Location: [facility] ENDOSCOPY;  Service: Cardiovascular;  Laterality: N/A;    There were no vitals filed for this visit.   Subjective Assessment - 08/25/20 1018     Subjective No new complaints. No falls or pain to report.    Patient is accompained by: Family member   girl friend [name]   Patient Stated Goals Pt wants to become more independent.    Currently in Pain? No/denies    Pain Score 0-No pain                 [facility] Adult PT Treatment/Exercise - 08/25/20 1020       Transfers   Transfers Sit to Stand;Stand to  Sit    Sit to Stand 6: Modified independent (Device/Increase time);Without upper extremity assist;From bed    Stand to Sit 6: Modified independent (Device/Increase time);Without upper extremity assist;To bed      Ambulation/Gait   Ambulation/Gait Yes    Ambulation/Gait Assistance 5: Supervision;4: Min guard    Ambulation/Gait Assistance Details around clinic with session.    Assistive device None    Gait Pattern Step-through pattern;Decreased arm swing - right;Decreased arm swing - left;Wide base of support;Ataxic    Ambulation Surface Level;Indoor      High Level Balance   High Level Balance Activities Side stepping;Marching forwards;Marching backwards;Tandem walking   tandem forward/backward   High Level Balance Comments on red/blue mats next to counter top with light to no UE support for 4 laps each, min guard to min assist with cues on posture, ex form and weight shifting.      Neuro Re-ed    Neuro Re-ed Details  for balance/NMR/coordination: gait around track working on speed changes, scanning all directions randomly, sudden stops/starts with min guard assist, cues to keep walking when scanning left or right needed. decreased gait speed noted with scanning as well.                 Balance Exercises - 08/25/20 1045  Balance Exercises: Standing   Standing Eyes Closed Narrow base of support (BOS);Wide (BOA);Head turns;Foam/compliant surface;Other reps (comment);30 secs;Limitations    Standing Eyes Closed Limitations on airex no UE support: narrow BOS for EC 30 sec's x 3 reps, then wide BOS for head movements left<>right, up<>down for ~10 reps each. min guard to min assist for balance with cues on posture and weight shifting to assist with balance.    SLS with Vectors Solid surface;Other reps (comment);Limitations    SLS with Vectors Limitations on floor with 2 talls: alternating forward, then lateral single foot taps for 10 reps each, then alternating forward double foot  taps for 10 reps each side. min guard to min assist with cues to keep a wide base of support and for weight shifting.    Partial Tandem Stance Eyes open;Eyes closed;Foam/compliant surface;Intermittent upper extremity support;3 reps;30 secs;Limitations    Partial Tandem Stance Limitations on airex with no UE support with occasional touch to walls/chair:                 PT Short Term Goals - 08/20/20 1325       PT SHORT TERM GOAL #1   Title Pt will perform HEP with family supervision for improved strength, balance, transfers, and gait.    TARGET 08/20/2020    Baseline No current HEP    Time 4    Period Weeks    Status Achieved      PT SHORT TERM GOAL #2   Title Pt will improve 5x sit<>stand to less than or equal to 11.5 sec to demonstrate improved functional strength and transfer efficiency.    Baseline 13.53 sec; 8/522:  13.10 1st trial; 10.66 sec    Time 4    Period Weeks    Status Achieved      PT SHORT TERM GOAL #3   Title Pt will improve DGI score to at least 17/24 to decrease fall risk.    Baseline 14/24 at eval; 08/20/20:  17/24    Time 4    Period Weeks    Status Achieved      PT SHORT TERM GOAL #4   Title Pt will verbalize understanding of fall prevention in home environment.    Baseline at fall risk per DGI    Time 4    Period Weeks    Status Achieved               PT Long Term Goals - 07/22/20 1839       PT LONG TERM GOAL #1   Title Pt will perform progression of HEP with family supervision for improved strength, balance, transfers, and gait.  TARGET 09/17/2020    Baseline No current HEP    Time 8    Period Weeks    Status New      PT LONG TERM GOAL #2   Title Pt will improve gait velocity to at least 2.8 ft/sec for improved gait efficiency and safety.    Baseline 2.4 ft/sec    Time 8    Period Weeks    Status New      PT LONG TERM GOAL #3   Title Pt will improve DGI score to at least 22/24 to decrease fall risk.    Baseline 14/24    Time 8     Period Weeks    Status New      PT LONG TERM GOAL #4   Title Pt will ambulate at least 1000 ft, indoor and outdoor  surfaces, independently, no LOB for improved community gait.    Baseline currently needs supervision for gait    Time 8    Period Weeks    Status New                   Plan - 08/25/20 1019     Clinical Impression Statement Today's skilled session continued to focus on high level gait and balance training. No issues noted or reported with session. Pt continues to need multi modal cues with increased time for processing with tasks. The pt is progressing toward goals and should benefit from continued PT to progress toward unmet goals.    Personal Factors and Comorbidities Comorbidity 3+    Comorbidities PMH:  DM, HTN, HLD    Examination-Activity Limitations Locomotion Level;Transfers    Examination-Participation Restrictions Community Activity;Occupation    Stability/Clinical Decision Making Evolving/Moderate complexity    Rehab Potential Good    PT Frequency 2x / week    PT Duration 8 weeks   plus eval   PT Treatment/Interventions ADLs/Self Care Home Management;Gait training;Stair training;Functional mobility training;Therapeutic activities;Therapeutic exercise;Balance training;Neuromuscular re-education;Patient/family education    PT Next Visit Plan Be aware that pt requires extra time and cues for demo of exercises/activities.  Single limb standing balance exercises;  Continue compliant surfaces for balance and progress for functional strength; work on dynamic balance and gait; corner balance exercises    Consulted and Agree with Plan of Care Patient;Family member/caregiver    Family Member Consulted [name]             Patient will benefit from skilled therapeutic intervention in order to improve the following deficits and impairments:  Abnormal gait, Difficulty walking, Impaired tone, Decreased balance, Decreased mobility, Decreased strength, Postural  dysfunction  Visit Diagnosis: Unsteadiness on feet  Other abnormalities of gait and mobility  Muscle weakness (generalized)     Problem List Patient Active Problem List   Diagnosis Date Noted   AKI (acute kidney injury) (HCC) 07/07/2020   Middle cerebral artery embolism, 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 [address] [place], [place] [address] [phone] 08/26/20, 1:58 PM   Name: Karee Christopherson MRN: [id number] Date of Birth: [birth year]

## 2020-08-25 NOTE — Therapy (Signed)
[facility] [address] [place], [place], [address] Phone: [phone]   Fax:  [phone]  Occupational Therapy Treatment  Patient Details  Name: Nathaniel Spears MRN: [id number] Date of Birth: Jul 22, 1975 Referring Provider (OT): Lauraine Rinne PA-C   Encounter Date: 08/25/2020   OT End of Session - 08/25/20 1017     Visit Number 5    Number of Visits 17    Date for OT Re-Evaluation 11/03/20    Authorization Type Self Pay - did instruct in financial aid    OT Start Time 0930    OT Stop Time 1012    OT Time Calculation (min) 42 min    Activity Tolerance Patient tolerated treatment well    Behavior During Therapy [facility] for tasks assessed/performed             Past Medical History:  Diagnosis Date   Hypertension    Stroke [facility])     Past Surgical History:  Procedure Laterality Date   BUBBLE STUDY  06/01/2020   Procedure: BUBBLE STUDY;  Surgeon: Donato Heinz, MD;  Location: [place];  Service: Cardiovascular;;   IR ANGIO INTRA EXTRACRAN SEL COM CAROTID INNOMINATE BILAT MOD SED  06/02/2020   IR ANGIO VERTEBRAL SEL SUBCLAVIAN INNOMINATE BILAT MOD SED  06/02/2020   TEE WITHOUT CARDIOVERSION N/A 06/01/2020   Procedure: TRANSESOPHAGEAL ECHOCARDIOGRAM (TEE);  Surgeon: Donato Heinz, MD;  Location: [facility] ENDOSCOPY;  Service: Cardiovascular;  Laterality: N/A;    There were no vitals filed for this visit.   Subjective Assessment - 08/25/20 0937     Subjective  We finished the antibiotic for his eye (per spouse)    Patient is accompanied by: Family member   [name] (girlfriend)   Pertinent History admitted on 06/15/2020 with c/o aphasia, with multiple recent admits for repeated strokes. Pt most recently admitted 5/20-5/23 for acute strokes before being transferred at family request to [facility] for further work-up. While in the ED, pt had worsening aphasia compared to baseline. MRI reveals several new or  increased acute infarcts are  demonstrated in both cerebral hemispheres (today the bilateral MCA). PMH: HTN, T2DM (5 stokes since 04/20/20)    Limitations aphasia, apraxia, no driving    Patient Stated Goals "to be able to talk"    Currently in Pain? No/denies              Simulated cutting food with extreme difficulty due to apraxia - pt required hand over hand assist to get correct motion, then able to do with relatively min difficulty. Suggested to wife dycem under plate to keep from sliding and initial hand over hand assist prn. Pt may also benefit from pizza roller/cutter.   Pt copying peg design with max cueing - very simple verbal cues and visual cues - Pt did improve w/ repetition of doing squares on design but difficulty shifting to new square. Pt also required cues for correct colors and to use Rt hand.   Environmental scanning finding 6/7 items with body cues from therapist - stopping and pausing and looking in general direction for pt to find items                      OT Short Term Goals - 08/25/20 1017       OT SHORT TERM GOAL #1   Title Independent with bilateral coordination HEP and putty HEP for Rt hand    Time 4    Period Weeks  Status Achieved   w/ spouse assist     OT SHORT TERM GOAL #2   Title Pt will perform environmental scanning with 75% accuracy    Time 4    Period Weeks    Status On-going   08/25/20: found 6/7 items w/ cues     OT SHORT TERM GOAL #3   Title Pt will perform simple meal prep (cold and/or microwaveable) and light housekeeping with min A and good safety awareness    Time 4    Period Weeks    Status Partially Met   beginning to use microwave (occasional cues required), making snacks/cereal, rinsing dishes     OT SHORT TERM GOAL #4   Title Pt to demo hooking/unhooking buttons on pants consistently    Time 4    Period Weeks    Status Achieved      OT SHORT TERM GOAL #5   Title Pt to cut food with A/E prn safely     Time 4    Period Weeks    Status On-going   Pt performed in clinic w/ initial hand over hand assist required d/t apraxia              OT Long Term Goals - 08/03/20 1006       OT LONG TERM GOAL #1   Title Pt will verbalize understanding of adapted strategies for increasing independence and safety awareness with ADLs and IADLs (i.e. right inattention for cooking, laundry, motor planning for IADLs)    Time 8    Period Weeks    Status New      OT LONG TERM GOAL #2   Title Pt to increase grip strength Rt hand to 50 lbs or greater    Baseline 44.9 lbs    Time 8    Period Weeks    Status New      OT LONG TERM GOAL #3   Title Pt will perform simple warm meal prep and light housekeeping with supervision and good safety awareness.    Time 8    Period Weeks    Status New      OT LONG TERM GOAL #4   Title Pt will improve coordination bilaterally by 10 sec or greater    Baseline Rt = 44.13 sec, Lt = 42.07 sec    Time 8    Period Weeks    Status New                   Plan - 08/25/20 1019     Clinical Impression Statement Pt with continued R side inattention with mod cues required for scanning to right of task. Pt limited by apraxia and aphasia but improves w/ hand over hand assist and repetition of simple tasks    OT Occupational Profile and History Detailed Assessment- Review of Records and additional review of physical, cognitive, psychosocial history related to current functional performance    Occupational performance deficits (Please refer to evaluation for details): ADL's;IADL's;Leisure;Social Participation    Body Structure / Function / Physical Skills ADL;Coordination;Vision;IADL;UE functional use;Proprioception;Strength;Balance;Dexterity;Mobility;FMC    Cognitive Skills Attention;Sequencing;Perception;Understand;Memory;Problem Solve;Safety Awareness   difficult to assess formally due to aphasia and apraxia   Rehab Potential Good    Comorbidities Affecting  Occupational Performance: May have comorbidities impacting occupational performance    Modification or Assistance to Complete Evaluation  Min-Moderate modification of tasks or assist with assess necessary to complete eval    OT Frequency 2x / week  OT Duration 8 weeks   OR 16 visits over 12 weeks due to scheduling conflicts (possibly only being seen 1x/wk some weeks)   OT Treatment/Interventions Self-care/ADL training;Cognitive remediation/compensation;Visual/perceptual remediation/compensation;Patient/family education;Therapeutic activities;Therapeutic exercise;Functional Mobility Training;DME and/or AE instruction;Neuromuscular education;Manual Therapy    Plan continue w/ visual scanning/visual perception, some functional tasks    Consulted and Agree with Plan of Care Patient;Family member/caregiver    Family Member Consulted significant other, [name]             Patient will benefit from skilled therapeutic intervention in order to improve the following deficits and impairments:   Body Structure / Function / Physical Skills: ADL, Coordination, Vision, IADL, UE functional use, Proprioception, Strength, Balance, Dexterity, Mobility, FMC Cognitive Skills: Attention, Sequencing, Perception, Understand, Memory, Problem Solve, Safety Awareness (difficult to assess formally due to aphasia and apraxia)     Visit Diagnosis: 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)  Visuospatial deficit  Attention and concentration deficit  Apraxia  Other lack of coordination    Problem List Patient Active Problem List   Diagnosis Date Noted   AKI (acute kidney injury) ([place]) 07/07/2020   Middle cerebral artery embolism, 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 [place], [place], [address] Phone: [phone]   Fax:  [phone]  Name: Nathaniel Spears MRN: [id number] Date of Birth: December 14, 1975

## 2020-08-25 NOTE — Therapy (Signed)
[facility] [address] [place], [place], [address] Phone: [phone]   Fax:  [phone]  Speech Language Pathology Treatment  Patient Details  Name: Nathaniel Spears MRN: [id number] Date of Birth: [birth year] Referring Provider (SLP): Charlton Amor, PA-C   Encounter Date: 08/25/2020   End of Session - 08/25/20 1141     Visit Number 7    Number of Visits 25    Date for SLP Re-Evaluation 10/14/20    Authorization Type self-pay    SLP Start Time 1100    SLP Stop Time  1142    SLP Time Calculation (min) 42 min    Activity Tolerance Patient tolerated treatment well             Past Medical History:  Diagnosis Date   Hypertension    Stroke [facility])     Past Surgical History:  Procedure Laterality Date   BUBBLE STUDY  06/01/2020   Procedure: BUBBLE STUDY;  Surgeon: Little Ishikawa, MD;  Location: [facility] ENDOSCOPY;  Service: Cardiovascular;;   IR ANGIO INTRA EXTRACRAN SEL COM CAROTID INNOMINATE BILAT MOD SED  06/02/2020   IR ANGIO VERTEBRAL SEL SUBCLAVIAN INNOMINATE BILAT MOD SED  06/02/2020   TEE WITHOUT CARDIOVERSION N/A 06/01/2020   Procedure: TRANSESOPHAGEAL ECHOCARDIOGRAM (TEE);  Surgeon: Little Ishikawa, MD;  Location: [facility] ENDOSCOPY;  Service: Cardiovascular;  Laterality: N/A;    There were no vitals filed for this visit.   Subjective Assessment - 08/25/20 1101     Subjective "PT/OT"    Patient is accompained by: Family member    Currently in Pain? No/denies                   ADULT SLP TREATMENT - 08/25/20 1102       General Information   Behavior/Cognition Alert;Cooperative;Pleasant mood;Requires cueing      Treatment Provided   Treatment provided Cognitive-Linquistic      Cognitive-Linquistic Treatment   Treatment focused on Aphasia;Apraxia    Skilled Treatment SLP continued education and training of VNeST for aphasia. SLP targeted words "drink" and "ride." Occasional min  to mod verbal cues required to name subjects x3. Rare min A required to name objects x2. Occasional min to mod A required to expand responses via where/when/why. Pt read sentences aloud with intermittent min A due to perseverations and paraphasias. Pt and significant other to continue to practice VNeST at home.      Assessment / Recommendations / Plan   Plan Continue with current plan of care      Progression Toward Goals   Progression toward goals Progressing toward goals              SLP Education - 08/25/20 1141     Education Details 2-3 word for VNeST per day    Person(s) Educated Patient;Caregiver(s)    Methods Demonstration;Explanation;Handout    Comprehension Verbalized understanding;Returned demonstration;Need further instruction              SLP Short Term Goals - 08/25/20 1345       SLP SHORT TERM GOAL #1   Title Pt will name 5-10 words in personally relevant categories with occasional mod A over 2 sessions    Baseline 08-13-20    Time 1    Period Weeks    Status On-going      SLP SHORT TERM GOAL #2   Title Pt will read sentences with ability to correct errors with 80% accuracy given occasional mod A  over 2 sessions    Baseline 08-12-20, 08-20-20    Status Achieved      SLP SHORT TERM GOAL #3   Title Pt will use external visual communication aids to augment verbal expression for communication of wants/needs for 4/5 opportunities with occasional min A over 2 sessions    Time 1    Period Weeks    Status On-going      SLP SHORT TERM GOAL #4   Title Pt will complete picture description tasks by verbalizing at least 2 words for 8/10 opportunities with occasional mod A over 2 sessions    Time 1    Period Weeks    Status On-going      SLP SHORT TERM GOAL #5   Title Pt will verbalize 2-3 word phrases in response to simple conversational dialogue with usual mod A over 2 sessions    Time 1    Period Weeks    Status On-going              SLP Long Term  Goals - 08/25/20 1345       SLP LONG TERM GOAL #1   Title Pt will verbalize 3-4 word sentences in response to simple open ended questions with usual min A over 2 sessions    Time 9    Period Weeks    Status On-going      SLP LONG TERM GOAL #2   Title Pt will use mulitmodal communication system to augment verbal expression to meet needs at home with occasional min A from family over 2 sessions    Time 9    Period Weeks    Status On-going      SLP LONG TERM GOAL #3   Title Pt/caregiver will report improvements in frustration level and communication effectivness via QOL scale by 2 points at last ST session    Baseline CPB: 0    Time 9    Period Weeks    Status On-going              Plan - 08/25/20 1345     Clinical Impression Statement Ardie presented for OPST intervention to address apraxia and aphasia s/p multiple strokes in past 6 months. SLP targeted VNeST for aphasia and apraxia. Occasional min to mod cues required to name items for subject/object/expanded responses and occasional min A to accurately read sentences generated. See "skilled treatment" for additional details of today's session. Skilled ST intervention is warranted to address moderate to severe communication deficits impacting patient's ability to participate in simple conversations and communicate wants and needs effectively.    Speech Therapy Frequency 2x / week    Duration 12 weeks    Treatment/Interventions Compensatory strategies;Cueing hierarchy;Functional tasks;Patient/family education;Cognitive reorganization;Multimodal communcation approach;Environmental controls;Compensatory techniques;Internal/external aids;SLP instruction and feedback;Language facilitation    Potential to Achieve Goals Fair    Potential Considerations Severity of impairments;Previous level of function    SLP Home Exercise Plan provided    Consulted and Agree with Plan of Care Patient;Family member/caregiver    Family Member Consulted  [name]             Patient will benefit from skilled therapeutic intervention in order to improve the following deficits and impairments:   Aphasia  Verbal apraxia    Problem List Patient Active Problem List   Diagnosis Date Noted   AKI (acute kidney injury) (HCC) 07/07/2020   Middle cerebral artery embolism, 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, [place], [address] Phone: [phone]   Fax:  [phone]   Name: Waylyn Tenbrink MRN: [id number] Date of Birth: 05-Sep-1975

## 2020-08-27 ENCOUNTER — Encounter: Payer: Self-pay | Admitting: Physical Therapy

## 2020-08-27 ENCOUNTER — Ambulatory Visit: Payer: Medicaid Other | Admitting: Physical Therapy

## 2020-08-27 ENCOUNTER — Ambulatory Visit: Payer: Medicaid Other

## 2020-08-27 ENCOUNTER — Other Ambulatory Visit: Payer: Self-pay

## 2020-08-27 ENCOUNTER — Ambulatory Visit: Payer: Medicaid Other | Admitting: Occupational Therapy

## 2020-08-27 VITALS — BP 123/84 | HR 75

## 2020-08-27 DIAGNOSIS — R41840 Attention and concentration deficit: Secondary | ICD-10-CM

## 2020-08-27 DIAGNOSIS — R482 Apraxia: Secondary | ICD-10-CM

## 2020-08-27 DIAGNOSIS — R41842 Visuospatial deficit: Secondary | ICD-10-CM

## 2020-08-27 DIAGNOSIS — M6281 Muscle weakness (generalized): Secondary | ICD-10-CM

## 2020-08-27 DIAGNOSIS — R2689 Other abnormalities of gait and mobility: Secondary | ICD-10-CM

## 2020-08-27 DIAGNOSIS — R2681 Unsteadiness on feet: Secondary | ICD-10-CM

## 2020-08-27 DIAGNOSIS — R4701 Aphasia: Secondary | ICD-10-CM

## 2020-08-27 DIAGNOSIS — I69353 Hemiplegia and hemiparesis following cerebral infarction affecting right non-dominant side: Secondary | ICD-10-CM

## 2020-08-27 NOTE — Therapy (Signed)
[facility] [address] [place], [place], [address] Phone: [phone]   Fax:  [phone]  Physical Therapy Treatment  Patient Details  Name: Nathaniel Spears MRN: [id number] Date of Birth: [birth year] Referring Provider (PT): Raulkar (follow-up)   Encounter Date: 08/27/2020   PT End of Session - 08/27/20 0940     Visit Number 8    Number of Visits 17    Date for PT Re-Evaluation 09/17/20    Authorization Type Self-Pay at eval; Medicaid potential    PT Start Time 0934    PT Stop Time 1015    PT Time Calculation (min) 41 min    Equipment Utilized During Treatment Gait belt    Activity Tolerance Patient tolerated treatment well   Extra time and cues due to aphasia/apraxia(?)   Behavior During Therapy [facility] for tasks assessed/performed             Past Medical History:  Diagnosis Date   Hypertension    Stroke [facility])     Past Surgical History:  Procedure Laterality Date   BUBBLE STUDY  06/01/2020   Procedure: BUBBLE STUDY;  Surgeon: Little Ishikawa, MD;  Location: [facility] ENDOSCOPY;  Service: Cardiovascular;;   IR ANGIO INTRA EXTRACRAN SEL COM CAROTID INNOMINATE BILAT MOD SED  06/02/2020   IR ANGIO VERTEBRAL SEL SUBCLAVIAN INNOMINATE BILAT MOD SED  06/02/2020   TEE WITHOUT CARDIOVERSION N/A 06/01/2020   Procedure: TRANSESOPHAGEAL ECHOCARDIOGRAM (TEE);  Surgeon: Little Ishikawa, MD;  Location: [facility] ENDOSCOPY;  Service: Cardiovascular;  Laterality: N/A;    Vitals:   08/27/20 0942 08/27/20 1012  BP: (!) 136/93 123/84  Pulse: 75 75  SpO2: 99%      Subjective Assessment - 08/27/20 0937     Subjective Nathaniel Spears reporting pt woke up with increased confusion/disorientation this morning, not feeling well. Unable to check vitals as they do not have a BP cuff. He did miss his medications yesterday as she has a early appt herself that threw off the schedule. Took them this morning. Pt reports feeling better at this  time. No falls or pain to report.    Patient is accompained by: Family member   girlfriend Nathaniel Spears   Patient Stated Goals Pt wants to become more independent.    Currently in Pain? No/denies                    [facility] Adult PT Treatment/Exercise - 08/27/20 0944       Transfers   Transfers Sit to Stand;Stand to Sit    Sit to Stand 6: Modified independent (Device/Increase time);Without upper extremity assist;From bed    Stand to Sit 6: Modified independent (Device/Increase time);Without upper extremity assist;To bed      Ambulation/Gait   Ambulation/Gait Yes    Ambulation/Gait Assistance 5: Supervision;4: Min guard    Ambulation/Gait Assistance Details around clinic with session    Assistive device None    Gait Pattern Step-through pattern;Decreased arm swing - right;Decreased arm swing - left;Wide base of support;Ataxic    Ambulation Surface Level;Indoor      Neuro Re-ed    Neuro Re-ed Details  for balance/NMR: gait around track self tossing ball ,then tracking ball vertically, then tracking ball horizontally for 1 lap each. min guard to min assist with mulitmodal cues to task, Pt with decreased gait speed/veering with vertical and horizontal ball tacking, no issues noted with ball self tossing.  Balance Exercises - 08/27/20 1010       Balance Exercises: Standing   Standing Eyes Closed Narrow base of support (BOS);Wide (BOA);Head turns;Foam/compliant surface;Other reps (comment);30 secs;Limitations    Standing Eyes Closed Limitations on open densce blue foam: feet together for EC 30 sec's x 3 reps, progressing to feet wider apart for EC head movements left<>right, up<>down for ~10 reps each. min guard assist with posterior bias noted, pt able to self correct with cues.    Balance Beam standing across blue foam beam: alteranating forward stepping to floor/back onto the beam, then alternating backward stepping to floor/back onto beam for ~10-12 reps each.  starting with UE support for first few reps, progressing to no UE support with multimodal cues needed on task. min guard assist to min assist with no UE support.    Tandem Gait Forward;Retro;Upper extremity support;Limitations;4 reps    Tandem Gait Limitations on blue foam beam with light UE support on bars with cues on step placement on beam and posture. min guard assist for safety.    Sidestepping Foam/compliant support;Upper extremity support;4 reps;Limitations    Sidestepping Limitations on blue foam beam with light UE support with cues for forward gaze, step height and length with min guard assist for safety. verbal/demo cues needed with 1st lap, then just verbal cues.                 PT Short Term Goals - 08/20/20 1325       PT SHORT TERM GOAL #1   Title Pt will perform HEP with family supervision for improved strength, balance, transfers, and gait.    TARGET 08/20/2020    Baseline No current HEP    Time 4    Period Weeks    Status Achieved      PT SHORT TERM GOAL #2   Title Pt will improve 5x sit<>stand to less than or equal to 11.5 sec to demonstrate improved functional strength and transfer efficiency.    Baseline 13.53 sec; 8/522:  13.10 1st trial; 10.66 sec    Time 4    Period Weeks    Status Achieved      PT SHORT TERM GOAL #3   Title Pt will improve DGI score to at least 17/24 to decrease fall risk.    Baseline 14/24 at eval; 08/20/20:  17/24    Time 4    Period Weeks    Status Achieved      PT SHORT TERM GOAL #4   Title Pt will verbalize understanding of fall prevention in home environment.    Baseline at fall risk per DGI    Time 4    Period Weeks    Status Achieved               PT Long Term Goals - 07/22/20 1839       PT LONG TERM GOAL #1   Title Pt will perform progression of HEP with family supervision for improved strength, balance, transfers, and gait.  TARGET 09/17/2020    Baseline No current HEP    Time 8    Period Weeks    Status New       PT LONG TERM GOAL #2   Title Pt will improve gait velocity to at least 2.8 ft/sec for improved gait efficiency and safety.    Baseline 2.4 ft/sec    Time 8    Period Weeks    Status New      PT LONG TERM GOAL #  3   Title Pt will improve DGI score to at least 22/24 to decrease fall risk.    Baseline 14/24    Time 8    Period Weeks    Status New      PT LONG TERM GOAL #4   Title Pt will ambulate at least 1000 ft, indoor and outdoor surfaces, independently, no LOB for improved community gait.    Baseline currently needs supervision for gait    Time 8    Period Weeks    Status New                   Plan - 08/27/20 0940     Clinical Impression Statement Today's skilled session continued to focus on high level gait and balance training. Depite the issues of the morning reported by girl friend no differences noted with session, pt does still require multimodal cues, however does grasp the task with cues. The pt is progressing toward goals and should benefit from continued PT to progress toward unmet goals.    Personal Factors and Comorbidities Comorbidity 3+    Comorbidities PMH:  DM, HTN, HLD    Examination-Activity Limitations Locomotion Level;Transfers    Examination-Participation Restrictions Community Activity;Occupation    Stability/Clinical Decision Making Evolving/Moderate complexity    Rehab Potential Good    PT Frequency 2x / week    PT Duration 8 weeks   plus eval   PT Treatment/Interventions ADLs/Self Care Home Management;Gait training;Stair training;Functional mobility training;Therapeutic activities;Therapeutic exercise;Balance training;Neuromuscular re-education;Patient/family education    PT Next Visit Plan Be aware that pt requires extra time and cues for demo of exercises/activities.  Single limb standing balance exercises;  Continue compliant surfaces for balance and progress for functional strength; work on dynamic balance and gait; corner balance exercises     Consulted and Agree with Plan of Care Patient;Family member/caregiver    Family Member Consulted Nathaniel Spears             Patient will benefit from skilled therapeutic intervention in order to improve the following deficits and impairments:  Abnormal gait, Difficulty walking, Impaired tone, Decreased balance, Decreased mobility, Decreased strength, Postural dysfunction  Visit Diagnosis: Unsteadiness on feet  Muscle weakness (generalized)  Other abnormalities of gait and mobility     Problem List Patient Active Problem List   Diagnosis Date Noted   AKI (acute kidney injury) (HCC) 07/07/2020   Middle cerebral artery embolism, 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, [address] [place], [place] [address] [phone] 08/27/20, 11:24 AM   Name: Fabyan Loughmiller MRN: [id number] Date of Birth: [birth year]

## 2020-08-27 NOTE — Therapy (Signed)
[facility] [address] [place], [place], [address] Phone: [phone]   Fax:  [phone]  Speech Language Pathology Treatment  Patient Details  Name: Nathaniel Spears MRN: [id number] Date of Birth: February 19, 1975 Referring Provider (SLP): Charlton Amor, PA-C   Encounter Date: 08/27/2020   End of Session - 08/27/20 1001     Visit Number 8    Number of Visits 25    Date for SLP Re-Evaluation 10/14/20    Authorization Type self-pay    SLP Start Time 1015    SLP Stop Time  1100    SLP Time Calculation (min) 45 min    Activity Tolerance Patient tolerated treatment well             Past Medical History:  Diagnosis Date   Hypertension    Stroke [facility])     Past Surgical History:  Procedure Laterality Date   BUBBLE STUDY  06/01/2020   Procedure: BUBBLE STUDY;  Surgeon: Little Ishikawa, MD;  Location: [facility] ENDOSCOPY;  Service: Cardiovascular;;   IR ANGIO INTRA EXTRACRAN SEL COM CAROTID INNOMINATE BILAT MOD SED  06/02/2020   IR ANGIO VERTEBRAL SEL SUBCLAVIAN INNOMINATE BILAT MOD SED  06/02/2020   TEE WITHOUT CARDIOVERSION N/A 06/01/2020   Procedure: TRANSESOPHAGEAL ECHOCARDIOGRAM (TEE);  Surgeon: Little Ishikawa, MD;  Location: [facility] ENDOSCOPY;  Service: Cardiovascular;  Laterality: N/A;    There were no vitals filed for this visit.   Subjective Assessment - 08/27/20 1015     Subjective "balance" re: PT exercises    Currently in Pain? No/denies                   ADULT SLP TREATMENT - 08/27/20 1000       General Information   Behavior/Cognition Alert;Cooperative;Pleasant mood;Requires cueing      Treatment Provided   Treatment provided Cognitive-Linquistic      Cognitive-Linquistic Treatment   Treatment focused on Aphasia;Apraxia    Skilled Treatment SLP targeted picture naming task, in which pt named pictures with 94% accuracy independently with occasional self-corrections. With min  semantic cues, accuracy improved to 100% accuracy. SLP targeted picture description tasks with multiple elements, in which pt generated appropriate sentences with 43% accuracy. With SLP prompting components of pictures, accuracy improved to 78% accuracy. With min to mod semantic cues, accuracy improved to 100%. SLP targeted verbal description of 6 frame sequence, in which pt able to verbalize what occured in each frame with 83% accuracy. SLP targeted verbal response to simple Q and A, with usual prompting required to reply in sentences versus single words. Occasional min verbal cues required for naming.      Assessment / Recommendations / Plan   Plan Continue with current plan of care      Progression Toward Goals   Progression toward goals Progressing toward goals              SLP Education - 08/27/20 1151     Education Details functional tasks to utilize sentences versus single words    Person(s) Educated Patient;Caregiver(s)    Methods Explanation;Demonstration;Handout    Comprehension Verbalized understanding;Returned demonstration;Need further instruction              SLP Short Term Goals - 08/27/20 1001       SLP SHORT TERM GOAL #1   Title Pt will name 5-10 words in personally relevant categories with occasional mod A over 2 sessions    Baseline 08-13-20    Time  1    Period Weeks    Status On-going      SLP SHORT TERM GOAL #2   Title Pt will read sentences with ability to correct errors with 80% accuracy given occasional mod A over 2 sessions    Baseline 08-12-20, 08-20-20    Status Achieved      SLP SHORT TERM GOAL #3   Title Pt will use external visual communication aids to augment verbal expression for communication of wants/needs for 4/5 opportunities with occasional min A over 2 sessions    Time 1    Period Weeks    Status Deferred      SLP SHORT TERM GOAL #4   Title Pt will complete picture description tasks by verbalizing at least 2 words for 8/10  opportunities with occasional mod A over 2 sessions    Baseline 08-27-20    Time 1    Period Weeks    Status On-going      SLP SHORT TERM GOAL #5   Title Pt will verbalize 2-3 word phrases in response to simple conversational dialogue with usual mod A over 2 sessions    Baseline 08-27-20    Time 1    Period Weeks    Status On-going              SLP Long Term Goals - 08/27/20 1001       SLP LONG TERM GOAL #1   Title Pt will verbalize 3-4 word sentences in response to simple open ended questions with usual min A over 2 sessions    Time 9    Period Weeks    Status On-going      SLP LONG TERM GOAL #2   Title Pt will use mulitmodal communication system to augment verbal expression to meet needs at home with occasional min A from family over 2 sessions    Time 9    Period Weeks    Status On-going      SLP LONG TERM GOAL #3   Title Pt/caregiver will report improvements in frustration level and communication effectivness via QOL scale by 2 points at last ST session    Baseline CPB: 0    Time 9    Period Weeks    Status On-going              Plan - 08/27/20 1001     Clinical Impression Statement Delma presented for OPST intervention to address apraxia and aphasia s/p multiple strokes in past 6 months. SLP targeted picture naming and description tasks. Pt demonstrated adequate naming for 1-2 words but required occasional to usual prompting to elicit complete sentences. See "skilled treatment" for additional details of today's session. Skilled ST intervention is warranted to address moderate to severe communication deficits impacting patient's ability to participate in simple conversations and communicate wants and needs effectively.    Speech Therapy Frequency 2x / week    Duration 12 weeks    Treatment/Interventions Compensatory strategies;Cueing hierarchy;Functional tasks;Patient/family education;Cognitive reorganization;Multimodal communcation approach;Environmental  controls;Compensatory techniques;Internal/external aids;SLP instruction and feedback;Language facilitation    Potential to Achieve Goals Fair    Potential Considerations Severity of impairments;Previous level of function    SLP Home Exercise Plan provided    Consulted and Agree with Plan of Care Patient;Family member/caregiver    Family Member Consulted [name]             Patient will benefit from skilled therapeutic intervention in order to improve the following deficits and impairments:  Aphasia  Verbal apraxia    Problem List Patient Active Problem List   Diagnosis Date Noted   AKI (acute kidney injury) (HCC) 07/07/2020   Middle cerebral artery embolism, 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 [place], [place], [address] Phone: [phone]   Fax:  [phone]   Name: Daquarius Dubeau MRN: [id number] Date of Birth: 12-25-1975

## 2020-08-27 NOTE — Therapy (Signed)
[facility] [address] [place], [place], [address] Phone: [phone]   Fax:  [phone]  Occupational Therapy Treatment  Patient Details  Name: Nathaniel Spears MRN: [id number] Date of Birth: 1975-09-25 Referring Provider (OT): Lauraine Rinne PA-C   Encounter Date: 08/27/2020   OT End of Session - 08/27/20 1221     Visit Number 6    Number of Visits 17    Date for OT Re-Evaluation 11/03/20    Authorization Type Self Pay - did instruct in financial aid    OT Start Time 1102    OT Stop Time 1145    OT Time Calculation (min) 43 min    Activity Tolerance Patient tolerated treatment well    Behavior During Therapy [facility] for tasks assessed/performed             Past Medical History:  Diagnosis Date   Hypertension    Stroke [facility])     Past Surgical History:  Procedure Laterality Date   BUBBLE STUDY  06/01/2020   Procedure: BUBBLE STUDY;  Surgeon: Donato Heinz, MD;  Location: [place];  Service: Cardiovascular;;   IR ANGIO INTRA EXTRACRAN SEL COM CAROTID INNOMINATE BILAT MOD SED  06/02/2020   IR ANGIO VERTEBRAL SEL SUBCLAVIAN INNOMINATE BILAT MOD SED  06/02/2020   TEE WITHOUT CARDIOVERSION N/A 06/01/2020   Procedure: TRANSESOPHAGEAL ECHOCARDIOGRAM (TEE);  Surgeon: Donato Heinz, MD;  Location: [facility] ENDOSCOPY;  Service: Cardiovascular;  Laterality: N/A;    There were no vitals filed for this visit.   Subjective Assessment - 08/27/20 1101     Subjective  Pt reports nothing new. Denies pain. [name] spouse reports patient was very confused this morning.    Patient is accompanied by: Family member   [name] (girlfriend)   Pertinent History admitted on 06/15/2020 with c/o aphasia, with multiple recent admits for repeated strokes. Pt most recently admitted 5/20-5/23 for acute strokes before being transferred at family request to [facility] for further work-up. While in the ED, pt had worsening aphasia  compared to baseline. MRI reveals several new or increased acute infarcts are  demonstrated in both cerebral hemispheres (today the bilateral MCA). PMH: HTN, T2DM (5 stokes since 04/20/20)    Limitations aphasia, apraxia, no driving    Patient Stated Goals "to be able to talk"    Currently in Pain? No/denies                          OT Treatments/Exercises (OP) - 08/27/20 1104       Visual/Perceptual Exercises   Copy this Image PVC;Pegboard    Pegboard pt required max assistance for copying pattern. Initially had patient use RUE but was very difficulty with apraxia and cognitive ability to copy pattern so switched to completing with LUE with increased skill and less difficulty however still req'd mod cues. did not finish copying pattern as too difficulty for this day but worked on bimanual coordination with pulling pegs out of board.    PVC pt separated pieces with max assistance and difficulty with separating like pieces. OT had to separate pile of pieces and hand one at a time to place in correct group.      Neurological Re-education Exercises   Other Exercises 1 Pt with max difficulty d/t apraxia with bimanual coordination. Practiced putting lego pieces together with both hands simultaneously.  OT Short Term Goals - 08/25/20 1017       OT SHORT TERM GOAL #1   Title Independent with bilateral coordination HEP and putty HEP for Rt hand    Time 4    Period Weeks    Status Achieved   w/ spouse assist     OT SHORT TERM GOAL #2   Title Pt will perform environmental scanning with 75% accuracy    Time 4    Period Weeks    Status On-going   08/25/20: found 6/7 items w/ cues     OT SHORT TERM GOAL #3   Title Pt will perform simple meal prep (cold and/or microwaveable) and light housekeeping with min A and good safety awareness    Time 4    Period Weeks    Status Partially Met   beginning to use microwave (occasional cues required), making  snacks/cereal, rinsing dishes     OT SHORT TERM GOAL #4   Title Pt to demo hooking/unhooking buttons on pants consistently    Time 4    Period Weeks    Status Achieved      OT SHORT TERM GOAL #5   Title Pt to cut food with A/E prn safely    Time 4    Period Weeks    Status On-going   Pt performed in clinic w/ initial hand over hand assist required d/t apraxia              OT Long Term Goals - 08/03/20 1006       OT LONG TERM GOAL #1   Title Pt will verbalize understanding of adapted strategies for increasing independence and safety awareness with ADLs and IADLs (i.e. right inattention for cooking, laundry, motor planning for IADLs)    Time 8    Period Weeks    Status New      OT LONG TERM GOAL #2   Title Pt to increase grip strength Rt hand to 50 lbs or greater    Baseline 44.9 lbs    Time 8    Period Weeks    Status New      OT LONG TERM GOAL #3   Title Pt will perform simple warm meal prep and light housekeeping with supervision and good safety awareness.    Time 8    Period Weeks    Status New      OT LONG TERM GOAL #4   Title Pt will improve coordination bilaterally by 10 sec or greater    Baseline Rt = 44.13 sec, Lt = 42.07 sec    Time 8    Period Weeks    Status New                   Plan - 08/27/20 1219     Clinical Impression Statement Pt limited by apraxia and aphasia - dificulty with right handed tasks > left handed tasks d/t apraxia. Pt demonstration difficulty with bimanual tasks today.    OT Occupational Profile and History Detailed Assessment- Review of Records and additional review of physical, cognitive, psychosocial history related to current functional performance    Occupational performance deficits (Please refer to evaluation for details): ADL's;IADL's;Leisure;Social Participation    Body Structure / Function / Physical Skills ADL;Coordination;Vision;IADL;UE functional use;Proprioception;Strength;Balance;Dexterity;Mobility;FMC     Cognitive Skills Attention;Sequencing;Perception;Understand;Memory;Problem Solve;Safety Awareness   difficult to assess formally due to aphasia and apraxia   Rehab Potential Good    Comorbidities Affecting Occupational Performance: May have comorbidities  impacting occupational performance    Modification or Assistance to Complete Evaluation  Min-Moderate modification of tasks or assist with assess necessary to complete eval    OT Frequency 2x / week    OT Duration 8 weeks   OR 16 visits over 12 weeks due to scheduling conflicts (possibly only being seen 1x/wk some weeks)   OT Treatment/Interventions Self-care/ADL training;Cognitive remediation/compensation;Visual/perceptual remediation/compensation;Patient/family education;Therapeutic activities;Therapeutic exercise;Functional Mobility Training;DME and/or AE instruction;Neuromuscular education;Manual Therapy    Plan continue w/ visual scanning/visual perception, some functional tasks, bimanual tasks    Consulted and Agree with Plan of Care Patient;Family member/caregiver    Family Member Consulted significant other, [name]             Patient will benefit from skilled therapeutic intervention in order to improve the following deficits and impairments:   Body Structure / Function / Physical Skills: ADL, Coordination, Vision, IADL, UE functional use, Proprioception, Strength, Balance, Dexterity, Mobility, FMC Cognitive Skills: Attention, Sequencing, Perception, Understand, Memory, Problem Solve, Safety Awareness (difficult to assess formally due to aphasia and apraxia)     Visit Diagnosis: Attention and concentration deficit  Apraxia  Visuospatial deficit  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)  Muscle weakness (generalized)  Unsteadiness on feet    Problem List Patient Active Problem List   Diagnosis Date Noted   AKI (acute kidney injury) ([place]) 07/07/2020   Middle  cerebral artery embolism, 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   Acute ischemic stroke ([place]) 06/05/2020   Acute left-sided weakness 06/04/2020   Nicotine dependence 06/04/2020   Cocaine use 06/04/2020   Aphasia    Polysubstance abuse ([place])    Type 2 diabetes mellitus with hyperglycemia, with long-term current use of insulin ([place])    Hypokalemia    Acute CVA (cerebrovascular accident) ([place]) 04/20/2020   Essential hypertension 08/21/2019   Obesity 08/21/2019    Zachery Conch MOT, OTR/L  08/27/2020, 12:22 PM  [facility] [address] [place], [place], [address] Phone: [phone]   Fax:  [phone]  Name: Nathaniel Spears MRN: [id number] Date of Birth: 04/30/1975

## 2020-08-30 ENCOUNTER — Other Ambulatory Visit: Payer: Self-pay

## 2020-08-30 ENCOUNTER — Ambulatory Visit: Payer: Medicaid Other | Admitting: Occupational Therapy

## 2020-08-30 ENCOUNTER — Ambulatory Visit: Payer: Medicaid Other | Admitting: Physical Therapy

## 2020-08-30 ENCOUNTER — Ambulatory Visit: Payer: Medicaid Other

## 2020-08-30 ENCOUNTER — Encounter: Payer: Self-pay | Admitting: Occupational Therapy

## 2020-08-30 ENCOUNTER — Encounter: Payer: Self-pay | Admitting: Physical Therapy

## 2020-08-30 VITALS — BP 127/85 | HR 69

## 2020-08-30 DIAGNOSIS — R41842 Visuospatial deficit: Secondary | ICD-10-CM

## 2020-08-30 DIAGNOSIS — R2681 Unsteadiness on feet: Secondary | ICD-10-CM

## 2020-08-30 DIAGNOSIS — M6281 Muscle weakness (generalized): Secondary | ICD-10-CM

## 2020-08-30 DIAGNOSIS — R482 Apraxia: Secondary | ICD-10-CM

## 2020-08-30 DIAGNOSIS — R41840 Attention and concentration deficit: Secondary | ICD-10-CM

## 2020-08-30 DIAGNOSIS — R2689 Other abnormalities of gait and mobility: Secondary | ICD-10-CM

## 2020-08-30 DIAGNOSIS — I69353 Hemiplegia and hemiparesis following cerebral infarction affecting right non-dominant side: Secondary | ICD-10-CM

## 2020-08-30 DIAGNOSIS — R4701 Aphasia: Secondary | ICD-10-CM

## 2020-08-30 DIAGNOSIS — R278 Other lack of coordination: Secondary | ICD-10-CM

## 2020-08-30 NOTE — Therapy (Signed)
[facility] [address] [place], [place], [address] Phone: [phone]   Fax:  [phone]  Physical Therapy Treatment  Patient Details  Name: Nathaniel Spears MRN: [id number] Date of Birth: Nov 04, 1975 Referring Provider (PT): Raulkar (follow-up)   Encounter Date: 08/30/2020   PT End of Session - 08/30/20 0933     Visit Number 9    Number of Visits 17    Date for PT Re-Evaluation 09/17/20    Authorization Type Self-Pay at eval; Medicaid potential    PT Start Time 0935    PT Stop Time 1015    PT Time Calculation (min) 40 min    Equipment Utilized During Treatment Gait belt    Activity Tolerance Patient tolerated treatment well   Extra time and cues due to aphasia/apraxia(?)   Behavior During Therapy [facility] for tasks assessed/performed             Past Medical History:  Diagnosis Date   Hypertension    Stroke [facility])     Past Surgical History:  Procedure Laterality Date   BUBBLE STUDY  06/01/2020   Procedure: BUBBLE STUDY;  Surgeon: Little Ishikawa, MD;  Location: [facility] ENDOSCOPY;  Service: Cardiovascular;;   IR ANGIO INTRA EXTRACRAN SEL COM CAROTID INNOMINATE BILAT MOD SED  06/02/2020   IR ANGIO VERTEBRAL SEL SUBCLAVIAN INNOMINATE BILAT MOD SED  06/02/2020   TEE WITHOUT CARDIOVERSION N/A 06/01/2020   Procedure: TRANSESOPHAGEAL ECHOCARDIOGRAM (TEE);  Surgeon: Little Ishikawa, MD;  Location: [facility] ENDOSCOPY;  Service: Cardiovascular;  Laterality: N/A;    Vitals:   08/30/20 0944  BP: 127/85  Pulse: 69     Subjective Assessment - 08/30/20 0933     Subjective [name] reports things got back on track after the day last week when he didn't feel quite right.  Still having a little more confusion.  Has an appointment with neurologist on Friday.  No falls.    Patient is accompained by: Family member   girlfriend [name]   Patient Stated Goals Pt wants to become more independent.    Currently in Pain?  No/denies                               [facility] Adult PT Treatment/Exercise - 08/30/20 0001       Ambulation/Gait   Ambulation/Gait Yes    Ambulation/Gait Assistance 5: Supervision;4: Min guard    Ambulation/Gait Assistance Details Negotiating around furniture, quick turns/change of directions    Ambulation Distance (Feet) 400 Feet   additional 400 ft   Assistive device None    Gait Pattern Step-through pattern;Decreased arm swing - right;Decreased arm swing - left;Wide base of support;Ataxic    Ambulation Surface Indoor;Level    Pre-Gait Activities Discussed walking at home (in apartment complex sidewalk area for longer distance walking.  As sidewalk is level, discussed pt walking outdoors on sidewalk with [name], 3-4 minutes per day, to start).    Gait Comments 25 ft x 2 of gait with head turns, then 25 ft x 2 with head nods.  Slowed pace, but no LOB.      Therapeutic Activites    Therapeutic Activities Other Therapeutic Activities    Other Therapeutic Activities Assessed BP measures-see vitals.  Discussed with pt/significant other warning signs/symptoms of CVA.  Significant other able to verbalize 3 of 5 warning signs initially.  Balance Exercises - 08/30/20 0001       Balance Exercises: Standing   Stepping Strategy Lateral;Foam/compliant surface;10 reps;Limitations    Stepping Strategy Limitations On balance beam    Balance Beam standing across blue foam beam: alteranating forward stepping to floor/back onto the beam, then alternating backward stepping to floor/back onto beam for ~10-12 reps each.  Then forward><back step and weightshift x 10 reps, tactile and verbal cues for technique.  UE support to lighter/no UE support at times.    Tandem Gait Forward;Retro;Upper extremity support;4 reps    Tandem Gait Limitations on blue foam beam with light UE support on bars with cues on step placement on beam and posture. min guard assist for  safety.    Sidestepping Foam/compliant support;Upper extremity support;4 reps;Limitations    Sidestepping Limitations on blue foam beam with light UE support with cues for forward gaze, step height and length with min guard assist for safety. verbal/demo cues needed with 1st lap, then just verbal cues.            Standing on incline/decline of ramp: Feet apart with head turns x 5, head nods x 5 Marching in place x 10 reps (initial cues for looking ahead) Forward step taps x 10 reps (initial cues for looking ahead)   PT Education - 08/30/20 1139     Education Details walking outside on level sidewalk with supervision, 1x/day 3-4 minutes to start    Person(s) Educated Patient;Spouse    Methods Explanation    Comprehension Verbalized understanding              PT Short Term Goals - 08/20/20 1325       PT SHORT TERM GOAL #1   Title Pt will perform HEP with family supervision for improved strength, balance, transfers, and gait.    TARGET 08/20/2020    Baseline No current HEP    Time 4    Period Weeks    Status Achieved      PT SHORT TERM GOAL #2   Title Pt will improve 5x sit<>stand to less than or equal to 11.5 sec to demonstrate improved functional strength and transfer efficiency.    Baseline 13.53 sec; 8/522:  13.10 1st trial; 10.66 sec    Time 4    Period Weeks    Status Achieved      PT SHORT TERM GOAL #3   Title Pt will improve DGI score to at least 17/24 to decrease fall risk.    Baseline 14/24 at eval; 08/20/20:  17/24    Time 4    Period Weeks    Status Achieved      PT SHORT TERM GOAL #4   Title Pt will verbalize understanding of fall prevention in home environment.    Baseline at fall risk per DGI    Time 4    Period Weeks    Status Achieved               PT Long Term Goals - 07/22/20 1839       PT LONG TERM GOAL #1   Title Pt will perform progression of HEP with family supervision for improved strength, balance, transfers, and gait.  TARGET  09/17/2020    Baseline No current HEP    Time 8    Period Weeks    Status New      PT LONG TERM GOAL #2   Title Pt will improve gait velocity to at least 2.8 ft/sec for improved gait  efficiency and safety.    Baseline 2.4 ft/sec    Time 8    Period Weeks    Status New      PT LONG TERM GOAL #3   Title Pt will improve DGI score to at least 22/24 to decrease fall risk.    Baseline 14/24    Time 8    Period Weeks    Status New      PT LONG TERM GOAL #4   Title Pt will ambulate at least 1000 ft, indoor and outdoor surfaces, independently, no LOB for improved community gait.    Baseline currently needs supervision for gait    Time 8    Period Weeks    Status New                   Plan - 08/30/20 1140     Clinical Impression Statement Continued to focus on gait and dynamic balance activities.  With cues for arm swing, pt is able to increase R arm swing with more balanced gait.  With compliant surfaces, he initially looks down at feet but is able to maintain more upright posture through the set of each exercises, after initial cue is given.  He will continue to benefit from skilled PT towards LTGs for improved overall functional mobility.    Personal Factors and Comorbidities Comorbidity 3+    Comorbidities PMH:  DM, HTN, HLD    Examination-Activity Limitations Locomotion Level;Transfers    Examination-Participation Restrictions Community Activity;Occupation    Stability/Clinical Decision Making Evolving/Moderate complexity    Rehab Potential Good    PT Frequency 2x / week    PT Duration 8 weeks   plus eval   PT Treatment/Interventions ADLs/Self Care Home Management;Gait training;Stair training;Functional mobility training;Therapeutic activities;Therapeutic exercise;Balance training;Neuromuscular re-education;Patient/family education    PT Next Visit Plan Be aware that pt requires extra time and cues for demo of exercises/activities.  Single limb standing balance exercises;   Continue compliant surfaces for balance and progress for functional strength; work on dynamic balance and gait; corner balance exercises.  Ask how pt did with outdoor walking.  Pt is only scheduled through next week; need to discuss additional appts through POC (09/17/20)    Consulted and Agree with Plan of Care Patient;Family member/caregiver    Family Member Consulted [name]             Patient will benefit from skilled therapeutic intervention in order to improve the following deficits and impairments:  Abnormal gait, Difficulty walking, Impaired tone, Decreased balance, Decreased mobility, Decreased strength, Postural dysfunction  Visit Diagnosis: Unsteadiness on feet  Other abnormalities of gait and mobility     Problem List Patient Active Problem List   Diagnosis Date Noted   AKI (acute kidney injury) (HCC) 07/07/2020   Middle cerebral artery embolism, 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 08/30/2020, 11:44 AM Gean Maidens., PT   [facility] [address] [place], [place], [address] Phone: [phone]   Fax:  [phone]  Name: Nathaniel Spears MRN: [id number] Date of Birth: 03-16-1975

## 2020-08-30 NOTE — Therapy (Signed)
[facility] [address] [place], [place], [address] Phone: [phone]   Fax:  [phone]  Speech Language Pathology Treatment  Patient Details  Name: Nathaniel Spears MRN: [id number] Date of Birth: October 09, 1975 Referring Provider (SLP): Nathaniel Amor, PA-C   Encounter Date: 08/30/2020   End of Session - 08/30/20 0956     Visit Number 9    Number of Visits 25    Date for SLP Re-Evaluation 10/14/20    Authorization Type self-pay    SLP Start Time 1015    SLP Stop Time  1100    SLP Time Calculation (min) 45 min    Activity Tolerance Patient tolerated treatment well             Past Medical History:  Diagnosis Date   Hypertension    Stroke [facility])     Past Surgical History:  Procedure Laterality Date   BUBBLE STUDY  06/01/2020   Procedure: BUBBLE STUDY;  Surgeon: Nathaniel Ishikawa, MD;  Location: [facility] ENDOSCOPY;  Service: Cardiovascular;;   IR ANGIO INTRA EXTRACRAN SEL COM CAROTID INNOMINATE BILAT MOD SED  06/02/2020   IR ANGIO VERTEBRAL SEL SUBCLAVIAN INNOMINATE BILAT MOD SED  06/02/2020   TEE WITHOUT CARDIOVERSION N/A 06/01/2020   Procedure: TRANSESOPHAGEAL ECHOCARDIOGRAM (TEE);  Surgeon: Nathaniel Ishikawa, MD;  Location: [facility] ENDOSCOPY;  Service: Cardiovascular;  Laterality: N/A;    There were no vitals filed for this visit.   Subjective Assessment - 08/30/20 1014     Subjective "he's a Nathaniel more confused this morning" per [facility]    Currently in Pain? No/denies                   ADULT SLP TREATMENT - 08/30/20 0955       General Information   Behavior/Cognition Alert;Cooperative;Pleasant mood;Requires cueing      Treatment Provided   Treatment provided Cognitive-Linquistic      Cognitive-Linquistic Treatment   Treatment focused on Aphasia;Apraxia    Skilled Treatment [name] significant other reports some confusion today. Per significant other, pt reportedly expressed his  concern re: caregiver burden and disappointment with current speech. SLP provided education and positive reinforcement for current progress. SLP targeted divergent and responsive naming for pictures/picture description. Pt able to name 5+ items in personally relevant categories with occasional extended time and min verbal semantic and phonemic cues. SLP provided intermittent clarification on picture naming tasks with 2 photos, as pt occasionally noted to name and point to the wrong picture. Pt exhibited some intermittent independent generation of sentences without SLP prompting for picture description tasks. Pt mostly able to self-correct apraxic or aphasic errors this session given extended time.      Assessment / Recommendations / Plan   Plan Continue with current plan of care      Progression Toward Goals   Progression toward goals Progressing toward goals              SLP Education - 08/30/20 1351     Education Details current progress, positive reinforcement, HEP    Person(s) Educated Patient;Caregiver(s)    Methods Explanation;Demonstration;Handout    Comprehension Verbalized understanding;Returned demonstration;Need further instruction              SLP Short Term Goals - 08/30/20 0956       SLP SHORT TERM GOAL #1   Title Pt will name 5-10 words in personally relevant categories with occasional mod A over 2 sessions    Baseline 08-13-20, 08-30-20  Status Achieved      SLP SHORT TERM GOAL #2   Title Pt will read sentences with ability to correct errors with 80% accuracy given occasional mod A over 2 sessions    Baseline 08-12-20, 08-20-20    Status Achieved      SLP SHORT TERM GOAL #3   Title Pt will use external visual communication aids to augment verbal expression for communication of wants/needs for 4/5 opportunities with occasional min A over 2 sessions    Status Deferred      SLP SHORT TERM GOAL #4   Title Pt will complete picture description tasks by verbalizing  at least 2 words for 8/10 opportunities with occasional mod A over 2 sessions    Baseline 08-27-20, 08-30-20    Status Achieved      SLP SHORT TERM GOAL #5   Title Pt will verbalize 2-3 word phrases in response to simple conversational dialogue with usual mod A over 2 sessions    Baseline 08-27-20, 08-30-20    Status Achieved              SLP Long Term Goals - 08/30/20 0957       SLP LONG TERM GOAL #1   Title Pt will verbalize 3-4 word sentences in response to simple open ended questions with usual min A over 2 sessions    Time 8    Period Weeks    Status On-going      SLP LONG TERM GOAL #2   Title Pt will use mulitmodal communication system to augment verbal expression to meet needs at home with occasional min A from family over 2 sessions    Time 8    Period Weeks    Status On-going      SLP LONG TERM GOAL #3   Title Pt/caregiver will report improvements in frustration level and communication effectivness via QOL scale by 2 points at last ST session    Baseline CPB: 0    Time 8    Period Weeks    Status On-going              Plan - 08/30/20 0956     Clinical Impression Statement Nathaniel Spears presented for OPST intervention to address apraxia and aphasia s/p multiple strokes in past 6 months. SLP targeted picture naming and description tasks. Pt demonstrated adequate naming with intermittent self-corrections exhibited. Rare to occasional cues required to clarifiy pictures named. Occasional to rare cues required to elicit complete sentences. See "skilled treatment" for additional details of today's session. Skilled ST intervention is warranted to address moderate to severe communication deficits impacting patient's ability to participate in simple conversations and communicate wants and needs effectively.    Speech Therapy Frequency 2x / week    Duration 12 weeks    Treatment/Interventions Compensatory strategies;Cueing hierarchy;Functional tasks;Patient/family  education;Cognitive reorganization;Multimodal communcation approach;Environmental controls;Compensatory techniques;Internal/external aids;SLP instruction and feedback;Language facilitation    Potential to Achieve Goals Fair    Potential Considerations Severity of impairments;Previous level of function    SLP Home Exercise Plan provided    Consulted and Agree with Plan of Care Patient;Family member/caregiver    Family Member Consulted [name]             Patient will benefit from skilled therapeutic intervention in order to improve the following deficits and impairments:   Aphasia  Verbal apraxia    Problem List Patient Active Problem List   Diagnosis Date Noted   AKI (acute kidney injury) (HCC) 07/07/2020  Middle cerebral artery embolism, 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 [place], [place], [address] Phone: [phone]   Fax:  [phone]   Name: Nathaniel Spears MRN: [id number] Date of Birth: 28-Dec-1975

## 2020-08-30 NOTE — Therapy (Signed)
[facility] [address] [place], [place], [address] Phone: [phone]   Fax:  [phone]  Occupational Therapy Treatment  Patient Details  Name: Nathaniel Spears MRN: [id number] Date of Birth: 1975/12/01 Referring Provider (OT): Lauraine Rinne PA-C   Encounter Date: 08/30/2020   OT End of Session - 08/30/20 1102     Visit Number 7    Number of Visits 17    Date for OT Re-Evaluation 11/03/20    Authorization Type Self Pay - did instruct in financial aid    OT Start Time 1100    OT Stop Time 1145    OT Time Calculation (min) 45 min    Activity Tolerance Patient tolerated treatment well    Behavior During Therapy [facility] for tasks assessed/performed             Past Medical History:  Diagnosis Date   Hypertension    Stroke [facility])     Past Surgical History:  Procedure Laterality Date   BUBBLE STUDY  06/01/2020   Procedure: BUBBLE STUDY;  Surgeon: Donato Heinz, MD;  Location: [place];  Service: Cardiovascular;;   IR ANGIO INTRA EXTRACRAN SEL COM CAROTID INNOMINATE BILAT MOD SED  06/02/2020   IR ANGIO VERTEBRAL SEL SUBCLAVIAN INNOMINATE BILAT MOD SED  06/02/2020   TEE WITHOUT CARDIOVERSION N/A 06/01/2020   Procedure: TRANSESOPHAGEAL ECHOCARDIOGRAM (TEE);  Surgeon: Donato Heinz, MD;  Location: [facility] ENDOSCOPY;  Service: Cardiovascular;  Laterality: N/A;    There were no vitals filed for this visit.   Subjective Assessment - 08/30/20 1101     Subjective  [name] spouse reports weekend was uneventul.    Patient is accompanied by: Family member   [name] (girlfriend)   Pertinent History admitted on 06/15/2020 with c/o aphasia, with multiple recent admits for repeated strokes. Pt most recently admitted 5/20-5/23 for acute strokes before being transferred at family request to [facility] for further work-up. While in the ED, pt had worsening aphasia compared to baseline. MRI reveals several new or increased  acute infarcts are  demonstrated in both cerebral hemispheres (today the bilateral MCA). PMH: HTN, T2DM (5 stokes since 04/20/20)    Limitations aphasia, apraxia, no driving    Patient Stated Goals "to be able to talk"    Currently in Pain? No/denies                          OT Treatments/Exercises (OP) - 08/30/20 1136       Neurological Re-education Exercises   Other Exercises 1 bimanual coordination with stringing beads and working through motor planning for using bilateral UE. started with medium beads with max difficulty following 3 step pattern and planning bimanual coordination. moved to dowel pegs/beads with stringing with mod difficulty - no pattern.    Other Exercises 2 resistance clothespins 1-8# with RUE with mod difficulty and cues for procedure of task    Hand Gripper with Medium Beads w 1 inch blocks with RUE - mod difficulty d/t apraxia with novel task but did well after initial HOH assistance. Pt improved with visual scanning to right today                      OT Short Term Goals - 08/25/20 1017       OT SHORT TERM GOAL #1   Title Independent with bilateral coordination HEP and putty HEP for Rt hand    Time 4  Period Weeks    Status Achieved   w/ spouse assist     OT SHORT TERM GOAL #2   Title Pt will perform environmental scanning with 75% accuracy    Time 4    Period Weeks    Status On-going   08/25/20: found 6/7 items w/ cues     OT SHORT TERM GOAL #3   Title Pt will perform simple meal prep (cold and/or microwaveable) and light housekeeping with min A and good safety awareness    Time 4    Period Weeks    Status Partially Met   beginning to use microwave (occasional cues required), making snacks/cereal, rinsing dishes     OT SHORT TERM GOAL #4   Title Pt to demo hooking/unhooking buttons on pants consistently    Time 4    Period Weeks    Status Achieved      OT SHORT TERM GOAL #5   Title Pt to cut food with A/E prn safely     Time 4    Period Weeks    Status On-going   Pt performed in clinic w/ initial hand over hand assist required d/t apraxia              OT Long Term Goals - 08/03/20 1006       OT LONG TERM GOAL #1   Title Pt will verbalize understanding of adapted strategies for increasing independence and safety awareness with ADLs and IADLs (i.e. right inattention for cooking, laundry, motor planning for IADLs)    Time 8    Period Weeks    Status New      OT LONG TERM GOAL #2   Title Pt to increase grip strength Rt hand to 50 lbs or greater    Baseline 44.9 lbs    Time 8    Period Weeks    Status New      OT LONG TERM GOAL #3   Title Pt will perform simple warm meal prep and light housekeeping with supervision and good safety awareness.    Time 8    Period Weeks    Status New      OT LONG TERM GOAL #4   Title Pt will improve coordination bilaterally by 10 sec or greater    Baseline Rt = 44.13 sec, Lt = 42.07 sec    Time 8    Period Weeks    Status New                   Plan - 08/30/20 1142     Clinical Impression Statement Pt with increased ability to complete bimanual tasks with mod difficulty. Pt continues to be limited by apraxia but making progress.    OT Occupational Profile and History Detailed Assessment- Review of Records and additional review of physical, cognitive, psychosocial history related to current functional performance    Occupational performance deficits (Please refer to evaluation for details): ADL's;IADL's;Leisure;Social Participation    Body Structure / Function / Physical Skills ADL;Coordination;Vision;IADL;UE functional use;Proprioception;Strength;Balance;Dexterity;Mobility;FMC    Cognitive Skills Attention;Sequencing;Perception;Understand;Memory;Problem Solve;Safety Awareness   difficult to assess formally due to aphasia and apraxia   Rehab Potential Good    Comorbidities Affecting Occupational Performance: May have comorbidities impacting  occupational performance    Modification or Assistance to Complete Evaluation  Min-Moderate modification of tasks or assist with assess necessary to complete eval    OT Frequency 2x / week    OT Duration 8 weeks   OR 16  visits over 12 weeks due to scheduling conflicts (possibly only being seen 1x/wk some weeks)   OT Treatment/Interventions Self-care/ADL training;Cognitive remediation/compensation;Visual/perceptual remediation/compensation;Patient/family education;Therapeutic activities;Therapeutic exercise;Functional Mobility Training;DME and/or AE instruction;Neuromuscular education;Manual Therapy    Plan continue w/ visual scanning/visual perception, some functional tasks, bimanual tasks, needs to schedule more visits    Consulted and Agree with Plan of Care Patient;Family member/caregiver    Family Member Consulted significant other, [name]             Patient will benefit from skilled therapeutic intervention in order to improve the following deficits and impairments:   Body Structure / Function / Physical Skills: ADL, Coordination, Vision, IADL, UE functional use, Proprioception, Strength, Balance, Dexterity, Mobility, FMC Cognitive Skills: Attention, Sequencing, Perception, Understand, Memory, Problem Solve, Safety Awareness (difficult to assess formally due to aphasia and apraxia)     Visit Diagnosis: Unsteadiness on feet  Attention and concentration deficit  Visuospatial deficit  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)  Muscle weakness (generalized)  Other lack of coordination  Other abnormalities of gait and mobility  Apraxia    Problem List Patient Active Problem List   Diagnosis Date Noted   AKI (acute kidney injury) ([place]) 07/07/2020   Middle cerebral artery embolism, 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   Acute ischemic stroke ([place]) 06/05/2020   Acute left-sided weakness 06/04/2020   Nicotine dependence 06/04/2020    Cocaine use 06/04/2020   Aphasia    Polysubstance abuse ([place])    Type 2 diabetes mellitus with hyperglycemia, with long-term current use of insulin (HCC)    Hypokalemia    Acute CVA (cerebrovascular accident) ([place]) 04/20/2020   Essential hypertension 08/21/2019   Obesity 08/21/2019    Zachery Conch MOT, OTR/L  08/30/2020, 11:45 [facility] [address] [place], [place], [address] Phone: [phone]   Fax:  [phone]  Name: Zohan Shiflet MRN: [id number] Date of Birth: 1975/11/26

## 2020-09-01 ENCOUNTER — Encounter: Payer: Self-pay | Admitting: Physical Therapy

## 2020-09-01 ENCOUNTER — Other Ambulatory Visit: Payer: Self-pay

## 2020-09-01 ENCOUNTER — Ambulatory Visit: Payer: Medicaid Other | Admitting: Occupational Therapy

## 2020-09-01 ENCOUNTER — Ambulatory Visit: Payer: Medicaid Other | Admitting: Physical Therapy

## 2020-09-01 ENCOUNTER — Ambulatory Visit: Payer: Medicaid Other

## 2020-09-01 DIAGNOSIS — R2681 Unsteadiness on feet: Secondary | ICD-10-CM | POA: Diagnosis not present

## 2020-09-01 DIAGNOSIS — R2689 Other abnormalities of gait and mobility: Secondary | ICD-10-CM

## 2020-09-01 DIAGNOSIS — R482 Apraxia: Secondary | ICD-10-CM

## 2020-09-01 DIAGNOSIS — M6281 Muscle weakness (generalized): Secondary | ICD-10-CM

## 2020-09-01 DIAGNOSIS — R41842 Visuospatial deficit: Secondary | ICD-10-CM

## 2020-09-01 DIAGNOSIS — R41840 Attention and concentration deficit: Secondary | ICD-10-CM

## 2020-09-01 DIAGNOSIS — R4701 Aphasia: Secondary | ICD-10-CM

## 2020-09-01 NOTE — Therapy (Signed)
[facility] [address] [place], [place], [address] Phone: [phone]   Fax:  [phone]  Occupational Therapy Treatment  Patient Details  Name: Nathaniel Spears MRN: [id number] Date of Birth: 1975/05/15 Referring Provider (OT): Lauraine Rinne PA-C   Encounter Date: 09/01/2020   OT End of Session - 09/01/20 1411     Visit Number 8    Number of Visits 17    Date for OT Re-Evaluation 11/03/20    Authorization Type Self Pay - did instruct in financial aid    OT Start Time 1400    OT Stop Time 1445    OT Time Calculation (min) 45 min    Activity Tolerance Patient tolerated treatment well    Behavior During Therapy [facility] for tasks assessed/performed             Past Medical History:  Diagnosis Date   Hypertension    Stroke [facility])     Past Surgical History:  Procedure Laterality Date   BUBBLE STUDY  06/01/2020   Procedure: BUBBLE STUDY;  Surgeon: Donato Heinz, MD;  Location: [place];  Service: Cardiovascular;;   IR ANGIO INTRA EXTRACRAN SEL COM CAROTID INNOMINATE BILAT MOD SED  06/02/2020   IR ANGIO VERTEBRAL SEL SUBCLAVIAN INNOMINATE BILAT MOD SED  06/02/2020   TEE WITHOUT CARDIOVERSION N/A 06/01/2020   Procedure: TRANSESOPHAGEAL ECHOCARDIOGRAM (TEE);  Surgeon: Donato Heinz, MD;  Location: [facility] ENDOSCOPY;  Service: Cardiovascular;  Laterality: N/A;    There were no vitals filed for this visit.   Subjective Assessment - 09/01/20 1401     Subjective  No changes    Patient is accompanied by: Family member   [name] (girlfriend)   Pertinent History admitted on 06/15/2020 with c/o aphasia, with multiple recent admits for repeated strokes. Pt most recently admitted 5/20-5/23 for acute strokes before being transferred at family request to [facility] for further work-up. While in the ED, pt had worsening aphasia compared to baseline. MRI reveals several new or increased acute infarcts are  demonstrated  in both cerebral hemispheres (today the bilateral MCA). PMH: HTN, T2DM (5 stokes since 04/20/20)    Limitations aphasia, apraxia, no driving    Patient Stated Goals "to be able to talk"    Currently in Pain? No/denies             Gripper set at level 3 to pick up blocks for sustained grip strength Rt hand and attention to Rt side, while scanning for targeted colors in sequence for cognitive component and visual scanning.   Sorting cards by suites then in numerical order w/ cues for attention to detail (sorting spade from club), and to redirect to task/directions.   Maze for visual scanning and planning ahead: simple with min cues, more advanced w/ mod to max cues                       OT Short Term Goals - 08/25/20 1017       OT SHORT TERM GOAL #1   Title Independent with bilateral coordination HEP and putty HEP for Rt hand    Time 4    Period Weeks    Status Achieved   w/ spouse assist     OT SHORT TERM GOAL #2   Title Pt will perform environmental scanning with 75% accuracy    Time 4    Period Weeks    Status On-going   08/25/20: found 6/7 items w/ cues  OT SHORT TERM GOAL #3   Title Pt will perform simple meal prep (cold and/or microwaveable) and light housekeeping with min A and good safety awareness    Time 4    Period Weeks    Status Partially Met   beginning to use microwave (occasional cues required), making snacks/cereal, rinsing dishes     OT SHORT TERM GOAL #4   Title Pt to demo hooking/unhooking buttons on pants consistently    Time 4    Period Weeks    Status Achieved      OT SHORT TERM GOAL #5   Title Pt to cut food with A/E prn safely    Time 4    Period Weeks    Status On-going   Pt performed in clinic w/ initial hand over hand assist required d/t apraxia              OT Long Term Goals - 08/03/20 1006       OT LONG TERM GOAL #1   Title Pt will verbalize understanding of adapted strategies for increasing independence and  safety awareness with ADLs and IADLs (i.e. right inattention for cooking, laundry, motor planning for IADLs)    Time 8    Period Weeks    Status New      OT LONG TERM GOAL #2   Title Pt to increase grip strength Rt hand to 50 lbs or greater    Baseline 44.9 lbs    Time 8    Period Weeks    Status New      OT LONG TERM GOAL #3   Title Pt will perform simple warm meal prep and light housekeeping with supervision and good safety awareness.    Time 8    Period Weeks    Status New      OT LONG TERM GOAL #4   Title Pt will improve coordination bilaterally by 10 sec or greater    Baseline Rt = 44.13 sec, Lt = 42.07 sec    Time 8    Period Weeks    Status New                   Plan - 09/01/20 1450     Clinical Impression Statement Pt w/ difficulty shifting b/t tasks and going back to same task if distracted. Pt however has improved since last week and noted improvement in speech/communication    OT Occupational Profile and History Detailed Assessment- Review of Records and additional review of physical, cognitive, psychosocial history related to current functional performance    Occupational performance deficits (Please refer to evaluation for details): ADL's;IADL's;Leisure;Social Participation    Body Structure / Function / Physical Skills ADL;Coordination;Vision;IADL;UE functional use;Proprioception;Strength;Balance;Dexterity;Mobility;FMC    Cognitive Skills Attention;Sequencing;Perception;Understand;Memory;Problem Solve;Safety Awareness   difficult to assess formally due to aphasia and apraxia   Rehab Potential Good    Comorbidities Affecting Occupational Performance: May have comorbidities impacting occupational performance    Modification or Assistance to Complete Evaluation  Min-Moderate modification of tasks or assist with assess necessary to complete eval    OT Frequency 2x / week    OT Duration 8 weeks   OR 16 visits over 12 weeks due to scheduling conflicts (possibly  only being seen 1x/wk some weeks)   OT Treatment/Interventions Self-care/ADL training;Cognitive remediation/compensation;Visual/perceptual remediation/compensation;Patient/family education;Therapeutic activities;Therapeutic exercise;Functional Mobility Training;DME and/or AE instruction;Neuromuscular education;Manual Therapy    Plan continue w/ visual scanning/visual perception, some functional tasks, bimanual tasks,    Consulted and Agree with  Plan of Care Patient;Family member/caregiver    Family Member Consulted significant other, [name]             Patient will benefit from skilled therapeutic intervention in order to improve the following deficits and impairments:   Body Structure / Function / Physical Skills: ADL, Coordination, Vision, IADL, UE functional use, Proprioception, Strength, Balance, Dexterity, Mobility, FMC Cognitive Skills: Attention, Sequencing, Perception, Understand, Memory, Problem Solve, Safety Awareness (difficult to assess formally due to aphasia and apraxia)     Visit Diagnosis: Visuospatial deficit  Attention and concentration deficit  Muscle weakness (generalized)    Problem List Patient Active Problem List   Diagnosis Date Noted   AKI (acute kidney injury) ([place]) 07/07/2020   Middle cerebral artery embolism, 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 [place], [place], [address] Phone: [phone]   Fax:  [phone]  Name:  Patricia Perales MRN: [id number] Date of Birth: [birth year]

## 2020-09-01 NOTE — Therapy (Signed)
[facility] [address] [place], [place], [address] Phone: [phone]   Fax:  [phone]  Speech Language Pathology Treatment  Patient Details  Name: Nathaniel Spears MRN: [id number] Date of Birth: 18-Oct-1975 Referring Provider (SLP): Charlton Amor, PA-C   Encounter Date: 09/01/2020   End of Session - 09/01/20 1320     Visit Number 10    Number of Visits 25    Date for SLP Re-Evaluation 10/14/20    Authorization Type self-pay    SLP Start Time 1320    SLP Stop Time  1400    SLP Time Calculation (min) 40 min    Activity Tolerance Patient tolerated treatment well             Past Medical History:  Diagnosis Date   Hypertension    Stroke [facility])     Past Surgical History:  Procedure Laterality Date   BUBBLE STUDY  06/01/2020   Procedure: BUBBLE STUDY;  Surgeon: Little Ishikawa, MD;  Location: [facility] ENDOSCOPY;  Service: Cardiovascular;;   IR ANGIO INTRA EXTRACRAN SEL COM CAROTID INNOMINATE BILAT MOD SED  06/02/2020   IR ANGIO VERTEBRAL SEL SUBCLAVIAN INNOMINATE BILAT MOD SED  06/02/2020   TEE WITHOUT CARDIOVERSION N/A 06/01/2020   Procedure: TRANSESOPHAGEAL ECHOCARDIOGRAM (TEE);  Surgeon: Little Ishikawa, MD;  Location: [facility] ENDOSCOPY;  Service: Cardiovascular;  Laterality: N/A;    There were no vitals filed for this visit.   Subjective Assessment - 09/01/20 1420     Subjective "he talks to my daughter"    Patient is accompained by: Family member   [name]   Currently in Pain? No/denies                   ADULT SLP TREATMENT - 09/01/20 1320       General Information   Behavior/Cognition Alert;Cooperative;Pleasant mood;Requires cueing      Treatment Provided   Treatment provided Cognitive-Linquistic      Cognitive-Linquistic Treatment   Treatment focused on Aphasia;Apraxia    Skilled Treatment Pt completed HEP; however, pt unable to name or describe activities completed. [name]  significant other reports he answered simple questions with occasional help. SLP suggested various ways to provide more opportunities to communicate at home due to limited functional practice. SLP prompted patient to name family members/ questions/statements for communication via telephone. Occasional min verbal and questioning cues needed to ID/name personally relevant information and subsequent questions. Pt read back sentences with occasional visual and verbal cues due to perseverations. SLP to target functional responses to questions next session.      Assessment / Recommendations / Plan   Plan Continue with current plan of care      Progression Toward Goals   Progression toward goals Progressing toward goals              SLP Education - 09/01/20 1425     Education Details functional communication opportunities, handout, HEP    Person(s) Educated Patient;Caregiver(s)    Methods Explanation;Demonstration;Handout    Comprehension Verbalized understanding;Returned demonstration;Need further instruction              SLP Short Term Goals - 08/30/20 0956       SLP SHORT TERM GOAL #1   Title Pt will name 5-10 words in personally relevant categories with occasional mod A over 2 sessions    Baseline 08-13-20, 08-30-20    Status Achieved      SLP SHORT TERM GOAL #2   Title  Pt will read sentences with ability to correct errors with 80% accuracy given occasional mod A over 2 sessions    Baseline 08-12-20, 08-20-20    Status Achieved      SLP SHORT TERM GOAL #3   Title Pt will use external visual communication aids to augment verbal expression for communication of wants/needs for 4/5 opportunities with occasional min A over 2 sessions    Status Deferred      SLP SHORT TERM GOAL #4   Title Pt will complete picture description tasks by verbalizing at least 2 words for 8/10 opportunities with occasional mod A over 2 sessions    Baseline 08-27-20, 08-30-20    Status Achieved      SLP  SHORT TERM GOAL #5   Title Pt will verbalize 2-3 word phrases in response to simple conversational dialogue with usual mod A over 2 sessions    Baseline 08-27-20, 08-30-20    Status Achieved              SLP Long Term Goals - 09/01/20 1327       SLP LONG TERM GOAL #1   Title Pt will verbalize 3-4 word sentences in response to simple open ended questions with usual min A over 2 sessions    Baseline 09-01-20    Time 8    Period Weeks    Status On-going      SLP LONG TERM GOAL #2   Title Pt will use mulitmodal communication system to augment verbal expression to meet needs at home with occasional min A from family over 2 sessions    Time 8    Period Weeks    Status On-going      SLP LONG TERM GOAL #3   Title Pt/caregiver will report improvements in frustration level and communication effectivness via QOL scale by 2 points at last ST session    Baseline CPB: 0    Time 8    Period Weeks    Status On-going              Plan - 09/01/20 1327     Clinical Impression Statement [facility] presented for OPST intervention to address apraxia and aphasia s/p multiple strokes in past 6 months. SLP targeted functional communication strategies to increase verbal output at home. Pt demonstrated adequate naming of family members with occasional min A. Pt able to name possible questions to ask with occasional min questioning cues.  Pt read sentences with occasional verbal and visual min A.  See "skilled treatment" for additional details of today's session. Skilled ST intervention is warranted to address moderate to severe communication deficits impacting patient's ability to participate in simple conversations and communicate wants and needs effectively.    Speech Therapy Frequency 2x / week    Duration 12 weeks    Treatment/Interventions Compensatory strategies;Cueing hierarchy;Functional tasks;Patient/family education;Cognitive reorganization;Multimodal communcation approach;Environmental  controls;Compensatory techniques;Internal/external aids;SLP instruction and feedback;Language facilitation    Potential to Achieve Goals Fair    Potential Considerations Severity of impairments;Previous level of function    SLP Home Exercise Plan provided    Consulted and Agree with Plan of Care Patient;Family member/caregiver    Family Member Consulted [name]             Patient will benefit from skilled therapeutic intervention in order to improve the following deficits and impairments:   Aphasia  Verbal apraxia    Problem List Patient Active Problem List   Diagnosis Date Noted   AKI (acute kidney  injury) (HCC) 07/07/2020   Middle cerebral artery embolism, 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   Acute ischemic stroke (HCC) 06/05/2020   Acute left-sided weakness 06/04/2020   Nicotine dependence 06/04/2020   Cocaine use 06/04/2020   Aphasia    Polysubstance abuse (HCC)    Type 2 diabetes mellitus with hyperglycemia, with long-term current use of insulin (HCC)    Hypokalemia    Acute CVA (cerebrovascular accident) (HCC) 04/20/2020   Essential hypertension 08/21/2019   Obesity 08/21/2019    Janann Colonel, MA CCC-SLP 09/01/2020, 2:28 PM   [facility] [address] [place], [place], [address] Phone: [phone]   Fax:  [phone]   Name: Leticia Mcdiarmid MRN: [id number] Date of Birth: 10/23/1975

## 2020-09-01 NOTE — Patient Instructions (Addendum)
How are you doing?  Mom I need to ask you a favor.  I love you.   Grandma How are you feeling? Are the puzzle books going okay? Is Sandy at work?  Tinnie Gens Do you watch the game today? How was work today? How is Mimi doing? Have you gone fishing lately?  Morrie Sheldon Are you working today? How's BJ? What do you do for fun?  Deanna Artis How's work going? How was your day? How is your husband doing? How are the starting five?   Ask your siblings when are their birthdays. Write it down somewhere that you remember

## 2020-09-01 NOTE — Therapy (Signed)
[facility] [address] [place], [place], [address] Phone: [phone]   Fax:  [phone]  Physical Therapy Treatment  Patient Details  Name: Nathaniel Spears MRN: [id number] Date of Birth: 08/20/1975 Referring Provider (PT): Raulkar (follow-up)   Encounter Date: 09/01/2020   PT End of Session - 09/01/20 1530     Visit Number 10    Number of Visits 17    Date for PT Re-Evaluation 09/17/20    Authorization Type Self-Pay at eval; Medicaid potential    PT Start Time 1445    PT Stop Time 1529    PT Time Calculation (min) 44 min    Equipment Utilized During Treatment Gait belt    Activity Tolerance Patient tolerated treatment well   Extra time and cues due to aphasia/apraxia(?)   Behavior During Therapy [facility] for tasks assessed/performed             Past Medical History:  Diagnosis Date   Hypertension    Stroke [facility])     Past Surgical History:  Procedure Laterality Date   BUBBLE STUDY  06/01/2020   Procedure: BUBBLE STUDY;  Surgeon: Little Ishikawa, MD;  Location: [facility] ENDOSCOPY;  Service: Cardiovascular;;   IR ANGIO INTRA EXTRACRAN SEL COM CAROTID INNOMINATE BILAT MOD SED  06/02/2020   IR ANGIO VERTEBRAL SEL SUBCLAVIAN INNOMINATE BILAT MOD SED  06/02/2020   TEE WITHOUT CARDIOVERSION N/A 06/01/2020   Procedure: TRANSESOPHAGEAL ECHOCARDIOGRAM (TEE);  Surgeon: Little Ishikawa, MD;  Location: [facility] ENDOSCOPY;  Service: Cardiovascular;  Laterality: N/A;    There were no vitals filed for this visit.   Subjective Assessment - 09/01/20 1446     Subjective Nathaniel Spears reports doing much better and has had good day with OT and speech so far.    Patient is accompained by: Family member   girlfriend Nathaniel Spears   Patient Stated Goals Pt wants to become more independent.    Currently in Pain? No/denies                               [facility] Adult PT Treatment/Exercise - 09/01/20 0001        Ambulation/Gait   Ambulation/Gait Yes    Ambulation/Gait Assistance 5: Supervision    Ambulation/Gait Assistance Details Additional outdoor gait, 800 ft, including sidewalk, gravel, mulch.  Cues for environmental scanning/quick stop/starts; pt does miss targets in environment when cues given to stop or turn at those targets.    Ambulation Distance (Feet) 600 Feet   800 ft (outdoors)   Assistive device None    Gait Pattern Step-through pattern;Decreased arm swing - right;Decreased arm swing - left;Wide base of support;Ataxic    Ambulation Surface Level;Indoor                 Balance Exercises - 09/01/20 0001       Balance Exercises: Standing   SLS with Vectors Solid surface;Other reps (comment);Limitations    SLS with Vectors Limitations Step taps to 6" step, BUE then 1 UE support    Standing, One Foot on a Step Eyes open;Limitations    Standing, One Foot on a Step Limitations Foot propped on ball wtih head turns x 10, then with head nods x 10 with UE support    Step Ups Forward;6 inch;UE support 2   10 reps   Balance Beam Tandem gait with toe taps side, along the beam, 4 reps.  BUE support and constant cues  for sequence    Tandem Gait Forward;Retro;Upper extremity support;4 reps    Tandem Gait Limitations on blue foam beam with light UE support on bars with cues on step placement on beam and posture. min guard assist for safety.    Sidestepping Foam/compliant support;Upper extremity support;4 reps;Limitations    Sidestepping Limitations on blue foam beam with light UE support with cues for forward gaze, step height and length with min guard assist for safety. verbal/demo cues needed with 1st lap, then just verbal cues.    Other Standing Exercises Single limb stance with foot on ball rolling ball forward/back x 10 reps, 2 set    Other Standing Exercises Comments On Airex, marching in place x 10 reps, 2 sets.  Forward step taps then return to middle.  Back>forward step taps x 10 reps  each.                 PT Short Term Goals - 08/20/20 1325       PT SHORT TERM GOAL #1   Title Pt will perform HEP with family supervision for improved strength, balance, transfers, and gait.    TARGET 08/20/2020    Baseline No current HEP    Time 4    Period Weeks    Status Achieved      PT SHORT TERM GOAL #2   Title Pt will improve 5x sit<>stand to less than or equal to 11.5 sec to demonstrate improved functional strength and transfer efficiency.    Baseline 13.53 sec; 8/522:  13.10 1st trial; 10.66 sec    Time 4    Period Weeks    Status Achieved      PT SHORT TERM GOAL #3   Title Pt will improve DGI score to at least 17/24 to decrease fall risk.    Baseline 14/24 at eval; 08/20/20:  17/24    Time 4    Period Weeks    Status Achieved      PT SHORT TERM GOAL #4   Title Pt will verbalize understanding of fall prevention in home environment.    Baseline at fall risk per DGI    Time 4    Period Weeks    Status Achieved               PT Long Term Goals - 07/22/20 1839       PT LONG TERM GOAL #1   Title Pt will perform progression of HEP with family supervision for improved strength, balance, transfers, and gait.  TARGET 09/17/2020    Baseline No current HEP    Time 8    Period Weeks    Status New      PT LONG TERM GOAL #2   Title Pt will improve gait velocity to at least 2.8 ft/sec for improved gait efficiency and safety.    Baseline 2.4 ft/sec    Time 8    Period Weeks    Status New      PT LONG TERM GOAL #3   Title Pt will improve DGI score to at least 22/24 to decrease fall risk.    Baseline 14/24    Time 8    Period Weeks    Status New      PT LONG TERM GOAL #4   Title Pt will ambulate at least 1000 ft, indoor and outdoor surfaces, independently, no LOB for improved community gait.    Baseline currently needs supervision for gait    Time 8  Period Weeks    Status New                   Plan - 09/01/20 1549     Clinical  Impression Statement Skilled PT session focused on gait indoors and outdoors as well as complaint surface and single limb stance.  With cues he is able to lessen UE support.  Gait on outdoor surfaces with no overt LOB; however, pt misses cues for looking at environmental targets to use as places to stop/start/turn around.  (Tried to use environmental targets as cues during gait activities, as significant other reports he was walking dog outside and could not sequence his way back to the apartment).  He will continue to beneift from skilled PT to further address balance, gait for improved functional mobility.    Personal Factors and Comorbidities Comorbidity 3+    Comorbidities PMH:  DM, HTN, HLD    Examination-Activity Limitations Locomotion Level;Transfers    Examination-Participation Restrictions Community Activity;Occupation    Stability/Clinical Decision Making Evolving/Moderate complexity    Rehab Potential Good    PT Frequency 2x / week    PT Duration 8 weeks   plus eval   PT Treatment/Interventions ADLs/Self Care Home Management;Gait training;Stair training;Functional mobility training;Therapeutic activities;Therapeutic exercise;Balance training;Neuromuscular re-education;Patient/family education    PT Next Visit Plan Be aware that pt requires extra time and cues for demo of exercises/activities.  Single limb standing balance exercises;  Continue compliant surfaces for balance and progress for functional strength; work on dynamic balance and gait.  With gait, work on environmental scanning and focusing on targets/landmarks as he walks.  Ask how pt did with outdoor walking.    Consulted and Agree with Plan of Care Patient;Family member/caregiver    Family Member Consulted Nathaniel Spears             Patient will benefit from skilled therapeutic intervention in order to improve the following deficits and impairments:  Abnormal gait, Difficulty walking, Impaired tone, Decreased balance, Decreased  mobility, Decreased strength, Postural dysfunction  Visit Diagnosis: Other abnormalities of gait and mobility  Unsteadiness on feet     Problem List Patient Active Problem List   Diagnosis Date Noted   AKI (acute kidney injury) (HCC) 07/07/2020   Middle cerebral artery embolism, 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 09/01/2020, 3:54 PM Gean Maidens., PT   [facility] [address] [place], [place], [address] Phone: [phone]   Fax:  [phone]  Name: Nathaniel Spears MRN: [id number] Date of Birth: [birth year]

## 2020-09-03 ENCOUNTER — Other Ambulatory Visit: Payer: Self-pay

## 2020-09-03 ENCOUNTER — Encounter
Payer: Medicaid Other | Attending: Physical Medicine and Rehabilitation | Admitting: Physical Medicine and Rehabilitation

## 2020-09-03 ENCOUNTER — Encounter: Payer: Self-pay | Admitting: Physical Medicine and Rehabilitation

## 2020-09-03 VITALS — BP 131/85 | HR 73 | Temp 98.8°F | Ht 73.0 in | Wt 249.5 lb

## 2020-09-03 DIAGNOSIS — I10 Essential (primary) hypertension: Secondary | ICD-10-CM | POA: Diagnosis not present

## 2020-09-03 DIAGNOSIS — I639 Cerebral infarction, unspecified: Secondary | ICD-10-CM | POA: Insufficient documentation

## 2020-09-03 DIAGNOSIS — Z6835 Body mass index (BMI) 35.0-35.9, adult: Secondary | ICD-10-CM | POA: Insufficient documentation

## 2020-09-03 DIAGNOSIS — E6609 Other obesity due to excess calories: Secondary | ICD-10-CM | POA: Diagnosis not present

## 2020-09-03 MED ORDER — METOPROLOL SUCCINATE ER 25 MG PO TB24
12.5000 mg | ORAL_TABLET | Freq: Every day | ORAL | 3 refills | Status: DC
  Filled 2020-09-03: qty 15, 30d supply, fill #0
  Filled 2020-10-07: qty 15, 30d supply, fill #1
  Filled 2020-11-11: qty 15, 30d supply, fill #2

## 2020-09-03 NOTE — Patient Instructions (Signed)
HTN: -BP is 131/85 today.  -Advised checking BP daily at home and logging results to bring into follow-up appointment with her PCP and myself. -Reviewed BP meds today.  -Advised regarding healthy foods that can help lower blood pressure and provided with a list: 1) citrus foods- high in vitamins and minerals 2) salmon and other fatty fish - reduces inflammation and oxylipins 3) swiss chard (leafy green)- high level of nitrates 4) pumpkin seeds- one of the best natural sources of magnesium 5) Beans and lentils- high in fiber, magnesium, and potassium 6) Berries- high in flavonoids 7) Amaranth (whole grain, can be cooked similarly to rice and oats)- high in magnesium and fiber 8) Pistachios- even more effective at reducing BP than other nuts 9) Carrots- high in phenolic compounds that relax blood vessels and reduce inflammation 10) Celery- contain phthalides that relax tissues of arterial walls 11) Tomatoes- can also improve cholesterol and reduce risk of heart disease 12) Broccoli- good source of magnesium, calcium, and potassium 13) Greek yogurt: high in potassium and calcium 14) Herbs and spices: Celery seed, cilantro, saffron, lemongrass, black cumin, ginseng, cinnamon, cardamom, sweet basil, and ginger 15) Chia and flax seeds- also help to lower cholesterol and blood sugar 16) Beets- high levels of nitrates that relax blood vessels  17) spinach and bananas- high in potassium  -Provided lise of supplements that can help with hypertension:  1) magnesium: one high quality brand is Bioptemizers since it contains all 7 types of magnesium, otherwise over the counter magnesium gluconate 400mg  is a good option 2) B vitamins 3) vitamin D 4) potassium 5) CoQ10 6) L-arginine 7) Vitamin C 8) Beetroot -Educated that goal BP is 120/80. -Made goal to incorporate some of the above foods into diet.

## 2020-09-03 NOTE — Progress Notes (Signed)
Subjective:    Patient ID: Nathaniel Spears, male    DOB: Aug 26, 1975, 45 y.o.   MRN: [id number]  HPI Nathaniel Spears is 45 year old man who presents for hospital follow-up after CVA  -they are working on using both hands in therapy, he prefers his left -working on balance -getting ready to discharge from PT and OT -speech will continued -no pain -walking has been fine. -no falls since home -walks up stairs himself  Pain Inventory Average Pain 0 Pain Right Now 0 My pain is  no pain  LOCATION OF PAIN  no pain  BOWEL Number of stools per week: 7 Oral laxative use No  Type of laxative n/a Enema or suppository use No  History of colostomy No  Incontinent No   BLADDER Normal  Able to self cath No  Bladder incontinence No  Frequent urination No  Leakage with coughing No  Difficulty starting stream No  Incomplete bladder emptying No    Mobility walk without assistance how many minutes can you walk? ability to climb steps?  yes do you drive?  no  Function not employed: date last employed n/a  Neuro/Psych confusion  Prior Studies N/a  Physicians involved in your care N/a   Family History  Problem Relation Age of Onset   Stroke Neg Hx    Social History   Socioeconomic History   Marital status: Single    Spouse name: Not on file   Number of children: Not on file   Years of education: Not on file   Highest education level: Not on file  Occupational History   Not on file  Tobacco Use   Smoking status: Former   Smokeless tobacco: Never  Vaping Use   Vaping Use: Never used  Substance and Sexual Activity   Alcohol use: Not Currently   Drug use: Never   Sexual activity: Not Currently  Other Topics Concern   Not on file  Social History Narrative   Not on file   Social Determinants of Health   Financial Resource Strain: Not on file  Food Insecurity: Not on file  Transportation Needs: Not on file  Physical Activity: Not on file  Stress: Not  on file  Social Connections: Not on file   Past Surgical History:  Procedure Laterality Date   BUBBLE STUDY  06/01/2020   Procedure: BUBBLE STUDY;  Surgeon: Little Ishikawa, MD;  Location: [facility] ENDOSCOPY;  Service: Cardiovascular;;   IR ANGIO INTRA EXTRACRAN SEL COM CAROTID INNOMINATE BILAT MOD SED  06/02/2020   IR ANGIO VERTEBRAL SEL SUBCLAVIAN INNOMINATE BILAT MOD SED  06/02/2020   TEE WITHOUT CARDIOVERSION N/A 06/01/2020   Procedure: TRANSESOPHAGEAL ECHOCARDIOGRAM (TEE);  Surgeon: Little Ishikawa, MD;  Location: [facility] ENDOSCOPY;  Service: Cardiovascular;  Laterality: N/A;   Past Medical History:  Diagnosis Date   Hypertension    Stroke (HCC)    There were no vitals taken for this visit.  Opioid Risk Score:   Fall Risk Score:  `1  Depression screen PHQ 2/9  Depression screen [facility] 2/9 05/18/2020 02/13/2020 08/21/2019  Decreased Interest 0 0 1  Down, Depressed, Hopeless 3 0 2  PHQ - 2 Score 3 0 3  Altered sleeping 3 - 3  Tired, decreased energy 3 - 3  Change in appetite 3 - 3  Feeling bad or failure about yourself  3 - 3  Trouble concentrating 3 - 3  Moving slowly or fidgety/restless 3 - 0  Suicidal thoughts 1 - 0  PHQ-9 Score 22 - 18  Difficult doing work/chores Very difficult - Somewhat difficult       Review of Systems  Constitutional: Negative.   HENT: Negative.    Eyes: Negative.   Respiratory: Negative.    Cardiovascular: Negative.   Gastrointestinal: Negative.   Endocrine: Negative.   Genitourinary: Negative.   Musculoskeletal: Negative.   Skin: Negative.   Allergic/Immunologic: Negative.   Neurological: Negative.   Hematological: Negative.   Psychiatric/Behavioral:  Positive for confusion.       Objective:   Physical Exam Gen: no distress, normal appearing HEENT: oral mucosa pink and moist, NCAT Cardio: Reg rate Chest: normal effort, normal rate of breathing Abd: soft, non-distended Ext: no edema Psych: pleasant, normal affect Skin:  intact Neuro: Alert and oriented x3. Expressive aphasia.  Musculoskeletal: 5/5 strength throughout     Assessment & Plan:   1) CVA: -doing really well! -continue outpatient PT, OT, SLP -has PCP  2) AKI: -continue 6-8 glasses of water per day.   3) DM type 2: -has plan for hgbA1c   4) HTN: -BP is 131/85 today.  -Advised checking BP daily at home and logging results to bring into follow-up appointment with her PCP and myself. -Reviewed BP meds today.  -Advised regarding healthy foods that can help lower blood pressure and provided with a list: 1) citrus foods- high in vitamins and minerals 2) salmon and other fatty fish - reduces inflammation and oxylipins 3) swiss chard (leafy green)- high level of nitrates 4) pumpkin seeds- one of the best natural sources of magnesium 5) Beans and lentils- high in fiber, magnesium, and potassium 6) Berries- high in flavonoids 7) Amaranth (whole grain, can be cooked similarly to rice and oats)- high in magnesium and fiber 8) Pistachios- even more effective at reducing BP than other nuts 9) Carrots- high in phenolic compounds that relax blood vessels and reduce inflammation 10) Celery- contain phthalides that relax tissues of arterial walls 11) Tomatoes- can also improve cholesterol and reduce risk of heart disease 12) Broccoli- good source of magnesium, calcium, and potassium 13) Greek yogurt: high in potassium and calcium 14) Herbs and spices: Celery seed, cilantro, saffron, lemongrass, black cumin, ginseng, cinnamon, cardamom, sweet basil, and ginger 15) Chia and flax seeds- also help to lower cholesterol and blood sugar 16) Beets- high levels of nitrates that relax blood vessels  17) spinach and bananas- high in potassium  -Provided lise of supplements that can help with hypertension:  1) magnesium: one high quality brand is Bioptemizers since it contains all 7 types of magnesium, otherwise over the counter magnesium gluconate 400mg  is a  good option 2) B vitamins 3) vitamin D 4) potassium 5) CoQ10 6) L-arginine 7) Vitamin C 8) Beetroot -Educated that goal BP is 120/80. -Made goal to incorporate some of the above foods into diet.

## 2020-09-06 ENCOUNTER — Other Ambulatory Visit: Payer: Self-pay

## 2020-09-06 ENCOUNTER — Ambulatory Visit: Payer: Medicaid Other | Admitting: Occupational Therapy

## 2020-09-06 ENCOUNTER — Encounter: Payer: Self-pay | Admitting: Physical Therapy

## 2020-09-06 ENCOUNTER — Ambulatory Visit: Payer: Medicaid Other | Admitting: Physical Therapy

## 2020-09-06 ENCOUNTER — Ambulatory Visit: Payer: Medicaid Other

## 2020-09-06 DIAGNOSIS — R41840 Attention and concentration deficit: Secondary | ICD-10-CM

## 2020-09-06 DIAGNOSIS — R4701 Aphasia: Secondary | ICD-10-CM

## 2020-09-06 DIAGNOSIS — R2681 Unsteadiness on feet: Secondary | ICD-10-CM | POA: Diagnosis not present

## 2020-09-06 DIAGNOSIS — R41842 Visuospatial deficit: Secondary | ICD-10-CM

## 2020-09-06 DIAGNOSIS — R482 Apraxia: Secondary | ICD-10-CM

## 2020-09-06 DIAGNOSIS — R278 Other lack of coordination: Secondary | ICD-10-CM

## 2020-09-06 DIAGNOSIS — R2689 Other abnormalities of gait and mobility: Secondary | ICD-10-CM

## 2020-09-06 DIAGNOSIS — M6281 Muscle weakness (generalized): Secondary | ICD-10-CM

## 2020-09-06 NOTE — Therapy (Signed)
[facility] [address] [place], [place], [address] Phone: [phone]   Fax:  [phone]  Speech Language Pathology Treatment  Patient Details  Name: Nathaniel Spears MRN: [id number] Date of Birth: 12/27/1975 Referring Provider (SLP): Charlton Amor, PA-C   Encounter Date: 09/06/2020   End of Session - 09/06/20 1427     Visit Number 11    Number of Visits 25    Date for SLP Re-Evaluation 10/14/20    Authorization Type self-pay    SLP Start Time 1232    SLP Stop Time  1315    SLP Time Calculation (min) 43 min    Activity Tolerance Other (comment)   frustration            Past Medical History:  Diagnosis Date   Hypertension    Stroke [facility])     Past Surgical History:  Procedure Laterality Date   BUBBLE STUDY  06/01/2020   Procedure: BUBBLE STUDY;  Surgeon: Little Ishikawa, MD;  Location: [facility] ENDOSCOPY;  Service: Cardiovascular;;   IR ANGIO INTRA EXTRACRAN SEL COM CAROTID INNOMINATE BILAT MOD SED  06/02/2020   IR ANGIO VERTEBRAL SEL SUBCLAVIAN INNOMINATE BILAT MOD SED  06/02/2020   TEE WITHOUT CARDIOVERSION N/A 06/01/2020   Procedure: TRANSESOPHAGEAL ECHOCARDIOGRAM (TEE);  Surgeon: Little Ishikawa, MD;  Location: [facility] ENDOSCOPY;  Service: Cardiovascular;  Laterality: N/A;    There were no vitals filed for this visit.   Subjective Assessment - 09/06/20 1236     Subjective "he isn't speaking as well"    Patient is accompained by: Family member   Nathaniel Spears   Currently in Pain? No/denies                   ADULT SLP TREATMENT - 09/06/20 1237       General Information   Behavior/Cognition Alert;Cooperative;Pleasant mood;Requires cueing      Treatment Provided   Treatment provided Cognitive-Linquistic      Cognitive-Linquistic Treatment   Treatment focused on Aphasia;Apraxia    Skilled Treatment [name] significant other reports pt is experiencing increased frustration related to  communication. Increased difficulty communicating exhibited yesterday and continues today. SLP educated patient and significant other on techniques to reduce frustration and augment verbal expression using communication aid, pictures, and/or writing. SLP targeted writing this session, in which pt able to verbally name and then spontaneously and accurately write words for 4-5x. Perseverations intermittently noted afterwards, with frustration exhibited. SLP cued slow rate and provided opportunities to copy/trace, which was beneficial .      Assessment / Recommendations / Plan   Plan Continue with current plan of care      Progression Toward Goals   Progression toward goals Progressing toward goals              SLP Education - 09/06/20 1427     Education Details managing frustration related to anomia, writing HEP    Person(s) Educated Patient;Caregiver(s)    Methods Explanation;Demonstration;Handout    Comprehension Verbalized understanding;Returned demonstration;Need further instruction              SLP Short Term Goals - 08/30/20 0956       SLP SHORT TERM GOAL #1   Title Pt will name 5-10 words in personally relevant categories with occasional mod A over 2 sessions    Baseline 08-13-20, 08-30-20    Status Achieved      SLP SHORT TERM GOAL #2   Title Pt will read sentences with  ability to correct errors with 80% accuracy given occasional mod A over 2 sessions    Baseline 08-12-20, 08-20-20    Status Achieved      SLP SHORT TERM GOAL #3   Title Pt will use external visual communication aids to augment verbal expression for communication of wants/needs for 4/5 opportunities with occasional min A over 2 sessions    Status Deferred      SLP SHORT TERM GOAL #4   Title Pt will complete picture description tasks by verbalizing at least 2 words for 8/10 opportunities with occasional mod A over 2 sessions    Baseline 08-27-20, 08-30-20    Status Achieved      SLP SHORT TERM GOAL #5    Title Pt will verbalize 2-3 word phrases in response to simple conversational dialogue with usual mod A over 2 sessions    Baseline 08-27-20, 08-30-20    Status Achieved              SLP Long Term Goals - 09/06/20 1429       SLP LONG TERM GOAL #1   Title Pt will verbalize 3-4 word sentences in response to simple open ended questions with usual min A over 2 sessions    Baseline 09-01-20    Time 7    Period Weeks    Status On-going      SLP LONG TERM GOAL #2   Title Pt will use mulitmodal communication system to augment verbal expression to meet needs at home with occasional min A from family over 2 sessions    Time 7    Period Weeks    Status On-going      SLP LONG TERM GOAL #3   Title Pt/caregiver will report improvements in frustration level and communication effectivness via QOL scale by 2 points at last ST session    Baseline CPB: 0    Time 7    Period Weeks    Status On-going              Plan - 09/06/20 1428     Clinical Impression Statement Nathaniel Spears presented for OPST intervention to address apraxia and aphasia s/p multiple strokes in past 6 months. SLP educated patient and family on functional communication aids to support verbal communication and to utilize at home when patient becomes frustrated. SLP trialed multimodal communication, in which writing was occasionally effective although perseverations noted. See "skilled treatment" for additional details of today's session. Skilled ST intervention is warranted to address moderate to severe communication deficits impacting patient's ability to participate in simple conversations and communicate wants and needs effectively.    Speech Therapy Frequency 2x / week    Duration 12 weeks    Treatment/Interventions Compensatory strategies;Cueing hierarchy;Functional tasks;Patient/family education;Cognitive reorganization;Multimodal communcation approach;Environmental controls;Compensatory techniques;Internal/external aids;SLP  instruction and feedback;Language facilitation    Potential to Achieve Goals Fair    Potential Considerations Severity of impairments;Previous level of function    SLP Home Exercise Plan provided    Consulted and Agree with Plan of Care Patient;Family member/caregiver    Family Member Consulted Nathaniel Spears             Patient will benefit from skilled therapeutic intervention in order to improve the following deficits and impairments:   Aphasia  Verbal apraxia    Problem List Patient Active Problem List   Diagnosis Date Noted   AKI (acute kidney injury) (HCC) 07/07/2020   Middle cerebral artery embolism, 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 [place], [place], [address] Phone: [phone]   Fax:  [phone]   Name: Nathaniel Spears MRN: [id number] Date of Birth: 05-Apr-1975

## 2020-09-06 NOTE — Therapy (Signed)
[facility] [address] [place], [place], [address] Phone: [phone]   Fax:  [phone]  Physical Therapy Treatment  Patient Details  Name: Nathaniel Spears MRN: [id number] Date of Birth: May 06, 1975 Referring Provider (PT): Raulkar (follow-up)   Encounter Date: 09/06/2020   PT End of Session - 09/06/20 1435     Visit Number 11    Number of Visits 17    Date for PT Re-Evaluation 09/17/20    Authorization Type Self-Pay at eval; Medicaid potential    PT Start Time 1319    PT Stop Time 1401    PT Time Calculation (min) 42 min    Activity Tolerance Patient tolerated treatment well   Extra time and cues due to aphasia/apraxia(?)   Behavior During Therapy [facility] for tasks assessed/performed             Past Medical History:  Diagnosis Date   Hypertension    Stroke [facility])     Past Surgical History:  Procedure Laterality Date   BUBBLE STUDY  06/01/2020   Procedure: BUBBLE STUDY;  Surgeon: Little Ishikawa, MD;  Location: [facility] ENDOSCOPY;  Service: Cardiovascular;;   IR ANGIO INTRA EXTRACRAN SEL COM CAROTID INNOMINATE BILAT MOD SED  06/02/2020   IR ANGIO VERTEBRAL SEL SUBCLAVIAN INNOMINATE BILAT MOD SED  06/02/2020   TEE WITHOUT CARDIOVERSION N/A 06/01/2020   Procedure: TRANSESOPHAGEAL ECHOCARDIOGRAM (TEE);  Surgeon: Little Ishikawa, MD;  Location: [facility] ENDOSCOPY;  Service: Cardiovascular;  Laterality: N/A;    There were no vitals filed for this visit.   Subjective Assessment - 09/06/20 1321     Subjective No changes, no falls.    Patient is accompained by: Family member   girlfriend [name]   Patient Stated Goals Pt wants to become more independent.    Currently in Pain? No/denies                               [facility] Adult PT Treatment/Exercise - 09/06/20 1323       Transfers   Transfers Sit to Stand;Stand to Sit    Sit to Stand 6: Modified independent (Device/Increase  time);Without upper extremity assist;From bed    Stand to Sit 6: Modified independent (Device/Increase time);Without upper extremity assist;To bed    Number of Reps 10 reps;2 sets    Comments from mat, second set standing on Airex      Ambulation/Gait   Ambulation/Gait Yes    Ambulation/Gait Assistance 5: Supervision    Ambulation/Gait Assistance Details Multiple bouts of gait in PT session, 200-500 ft with environmental scanning tasks    Ambulation Distance (Feet) 400 Feet    Assistive device None    Gait Pattern Step-through pattern;Decreased arm swing - right;Decreased arm swing - left;Wide base of support;Ataxic    Ambulation Surface Level;Indoor    Gait Comments Gait with envrionmental scanning tasks:  1)Looking for specific colored objects in gym area (focus on R sided attention), 2) gait with scanning to find specific colors of cones, 3)gait with sequences to find environmentmental cues, then stop/start/turn around.  No LOB, but pt significantly slows pace at time.  PT provides occasional cues to back up to look again for objects on R side.  Gait negotiating tighter spaces between furniture with close supervision.      Knee/Hip Exercises: Aerobic   Stepper Seated stepper, SciFit, Level 2.3,4 extremities x 5 minutes, for leg strength and flexibility.  PT Education - 09/06/20 1436     Education Details Attending to R side, looking to R side for items/landmarks, cues for girlfriend to increase conversation/activities, sitting on his R side to help him increase time looking to and attending to R side.    Person(s) Educated Patient;Spouse    Methods Explanation    Comprehension Verbalized understanding              PT Short Term Goals - 08/20/20 1325       PT SHORT TERM GOAL #1   Title Pt will perform HEP with family supervision for improved strength, balance, transfers, and gait.    TARGET 08/20/2020    Baseline No current HEP    Time 4    Period  Weeks    Status Achieved      PT SHORT TERM GOAL #2   Title Pt will improve 5x sit<>stand to less than or equal to 11.5 sec to demonstrate improved functional strength and transfer efficiency.    Baseline 13.53 sec; 8/522:  13.10 1st trial; 10.66 sec    Time 4    Period Weeks    Status Achieved      PT SHORT TERM GOAL #3   Title Pt will improve DGI score to at least 17/24 to decrease fall risk.    Baseline 14/24 at eval; 08/20/20:  17/24    Time 4    Period Weeks    Status Achieved      PT SHORT TERM GOAL #4   Title Pt will verbalize understanding of fall prevention in home environment.    Baseline at fall risk per DGI    Time 4    Period Weeks    Status Achieved               PT Long Term Goals - 07/22/20 1839       PT LONG TERM GOAL #1   Title Pt will perform progression of HEP with family supervision for improved strength, balance, transfers, and gait.  TARGET 09/17/2020    Baseline No current HEP    Time 8    Period Weeks    Status New      PT LONG TERM GOAL #2   Title Pt will improve gait velocity to at least 2.8 ft/sec for improved gait efficiency and safety.    Baseline 2.4 ft/sec    Time 8    Period Weeks    Status New      PT LONG TERM GOAL #3   Title Pt will improve DGI score to at least 22/24 to decrease fall risk.    Baseline 14/24    Time 8    Period Weeks    Status New      PT LONG TERM GOAL #4   Title Pt will ambulate at least 1000 ft, indoor and outdoor surfaces, independently, no LOB for improved community gait.    Baseline currently needs supervision for gait    Time 8    Period Weeks    Status New                   Plan - 09/06/20 1437     Clinical Impression Statement Skilled PT focused on gait training with environmental scanning activities, with focus on increased scanning to R side.  Pt does slow pace to attempt to look at R side, but he still misses objects on that side at times.  Also focused on funcitonal lower extremity  strength.  He will continue to benefit from skilled PT to further address strength, balance, gait towards independent functional mobility.    Personal Factors and Comorbidities Comorbidity 3+    Comorbidities PMH:  DM, HTN, HLD    Examination-Activity Limitations Locomotion Level;Transfers    Examination-Participation Restrictions Community Activity;Occupation    Stability/Clinical Decision Making Evolving/Moderate complexity    Rehab Potential Good    PT Frequency 2x / week    PT Duration 8 weeks   plus eval   PT Treatment/Interventions ADLs/Self Care Home Management;Gait training;Stair training;Functional mobility training;Therapeutic activities;Therapeutic exercise;Balance training;Neuromuscular re-education;Patient/family education    PT Next Visit Plan Be aware that pt requires extra time and cues for demo of exercises/activities.  Single limb standing balance exercises;  Continue compliant surfaces for balance and progress for functional strength; work on dynamic balance and gait.  With gait, work on environmental scanning and focusing on targets/landmarks as he walks.  Ask how pt did with outdoor walking.  Work towards LTGs, which need to be assessed next week (?anticipate d/c, but need to speak with pt/signifciant other)    Consulted and Agree with Plan of Care Patient;Family member/caregiver    Family Member Consulted [name]             Patient will benefit from skilled therapeutic intervention in order to improve the following deficits and impairments:  Abnormal gait, Difficulty walking, Impaired tone, Decreased balance, Decreased mobility, Decreased strength, Postural dysfunction  Visit Diagnosis: Other abnormalities of gait and mobility  Unsteadiness on feet  Muscle weakness (generalized)     Problem List Patient Active Problem List   Diagnosis Date Noted   AKI (acute kidney injury) (HCC) 07/07/2020   Middle cerebral artery embolism, 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 09/06/2020, 2:48 PM Gean Maidens., PT   [place] [facility] [address] [place], [place], [address] Phone: [phone]   Fax:  [phone]  Name: Breion Novacek MRN: [id number] Date of Birth: 1975/09/07

## 2020-09-06 NOTE — Therapy (Signed)
[facility] [address] [place], [place], [address] Phone: [phone]   Fax:  [phone]  Occupational Therapy Treatment  Patient Details  Name: Nathaniel Spears MRN: [id number] Date of Birth: 1975-02-04 Referring Provider (OT): Lauraine Rinne PA-C   Encounter Date: 09/06/2020   OT End of Session - 09/06/20 1447     Visit Number 9    Number of Visits 17    Date for OT Re-Evaluation 11/03/20    Authorization Type Self Pay - did instruct in financial aid    OT Start Time 1400    OT Stop Time 1445    OT Time Calculation (min) 45 min    Activity Tolerance Patient tolerated treatment well    Behavior During Therapy [facility] for tasks assessed/performed             Past Medical History:  Diagnosis Date   Hypertension    Stroke [facility])     Past Surgical History:  Procedure Laterality Date   BUBBLE STUDY  06/01/2020   Procedure: BUBBLE STUDY;  Surgeon: Donato Heinz, MD;  Location: [place];  Service: Cardiovascular;;   IR ANGIO INTRA EXTRACRAN SEL COM CAROTID INNOMINATE BILAT MOD SED  06/02/2020   IR ANGIO VERTEBRAL SEL SUBCLAVIAN INNOMINATE BILAT MOD SED  06/02/2020   TEE WITHOUT CARDIOVERSION N/A 06/01/2020   Procedure: TRANSESOPHAGEAL ECHOCARDIOGRAM (TEE);  Surgeon: Donato Heinz, MD;  Location: [facility] ENDOSCOPY;  Service: Cardiovascular;  Laterality: N/A;    There were no vitals filed for this visit.   Subjective Assessment - 09/06/20 1402     Subjective  Pt reported he had not eaten breakfast or lunch yet    Patient is accompanied by: Family member    Pertinent History admitted on 06/15/2020 with c/o aphasia, with multiple recent admits for repeated strokes. Pt most recently admitted 5/20-5/23 for acute strokes before being transferred at family request to [facility] for further work-up. While in the ED, pt had worsening aphasia compared to baseline. MRI reveals several new or increased acute infarcts  are  demonstrated in both cerebral hemispheres (today the bilateral MCA). 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 No/denies             Copying simple peg design w/ simple cues requires often b/t lines and during same line/color.  Bilateral integration/coordination stringing pegs w/ sequential colors (4) for cognitive component w/ max simple cueing and extra time.  Folding laundry w/ demo prior to attempting different items/articles of clothing (switching from towel to shirt)                          OT Short Term Goals - 08/25/20 1017       OT SHORT TERM GOAL #1   Title Independent with bilateral coordination HEP and putty HEP for Rt hand    Time 4    Period Weeks    Status Achieved   w/ spouse assist     OT SHORT TERM GOAL #2   Title Pt will perform environmental scanning with 75% accuracy    Time 4    Period Weeks    Status On-going   08/25/20: found 6/7 items w/ cues     OT SHORT TERM GOAL #3   Title Pt will perform simple meal prep (cold and/or microwaveable) and light housekeeping with min A and good safety awareness  Time 4    Period Weeks    Status Partially Met   beginning to use microwave (occasional cues required), making snacks/cereal, rinsing dishes     OT SHORT TERM GOAL #4   Title Pt to demo hooking/unhooking buttons on pants consistently    Time 4    Period Weeks    Status Achieved      OT SHORT TERM GOAL #5   Title Pt to cut food with A/E prn safely    Time 4    Period Weeks    Status On-going   Pt performed in clinic w/ initial hand over hand assist required d/t apraxia              OT Long Term Goals - 08/03/20 1006       OT LONG TERM GOAL #1   Title Pt will verbalize understanding of adapted strategies for increasing independence and safety awareness with ADLs and IADLs (i.e. right inattention for cooking, laundry, motor planning for IADLs)    Time 8     Period Weeks    Status New      OT LONG TERM GOAL #2   Title Pt to increase grip strength Rt hand to 50 lbs or greater    Baseline 44.9 lbs    Time 8    Period Weeks    Status New      OT LONG TERM GOAL #3   Title Pt will perform simple warm meal prep and light housekeeping with supervision and good safety awareness.    Time 8    Period Weeks    Status New      OT LONG TERM GOAL #4   Title Pt will improve coordination bilaterally by 10 sec or greater    Baseline Rt = 44.13 sec, Lt = 42.07 sec    Time 8    Period Weeks    Status New                   Plan - 09/06/20 1448     Clinical Impression Statement Pt w/ difficulty shifting b/t tasks and going back to same task if distracted. Pt requires increased time to process simple instructions d/t aphasia and cognitive deficits    OT Occupational Profile and History Detailed Assessment- Review of Records and additional review of physical, cognitive, psychosocial history related to current functional performance    Occupational performance deficits (Please refer to evaluation for details): ADL's;IADL's;Leisure;Social Participation    Body Structure / Function / Physical Skills ADL;Coordination;Vision;IADL;UE functional use;Proprioception;Strength;Balance;Dexterity;Mobility;FMC    Cognitive Skills Attention;Sequencing;Perception;Understand;Memory;Problem Solve;Safety Awareness   difficult to assess formally due to aphasia and apraxia   Rehab Potential Good    Comorbidities Affecting Occupational Performance: May have comorbidities impacting occupational performance    Modification or Assistance to Complete Evaluation  Min-Moderate modification of tasks or assist with assess necessary to complete eval    OT Frequency 2x / week    OT Duration 8 weeks   OR 16 visits over 12 weeks due to scheduling conflicts (possibly only being seen 1x/wk some weeks)   OT Treatment/Interventions Self-care/ADL training;Cognitive  remediation/compensation;Visual/perceptual remediation/compensation;Patient/family education;Therapeutic activities;Therapeutic exercise;Functional Mobility Training;DME and/or AE instruction;Neuromuscular education;Manual Therapy    Plan problem solve simple functional required tasks he can do at home I'ly when wife is working (from home)    Oncologist with Plan of Care Patient;Family member/caregiver    Family Member Consulted significant other, Reino Bellis  Patient will benefit from skilled therapeutic intervention in order to improve the following deficits and impairments:   Body Structure / Function / Physical Skills: ADL, Coordination, Vision, IADL, UE functional use, Proprioception, Strength, Balance, Dexterity, Mobility, FMC Cognitive Skills: Attention, Sequencing, Perception, Understand, Memory, Problem Solve, Safety Awareness (difficult to assess formally due to aphasia and apraxia)     Visit Diagnosis: Visuospatial deficit  Attention and concentration deficit  Other lack of coordination    Problem List Patient Active Problem List   Diagnosis Date Noted   AKI (acute kidney injury) ([place]) 07/07/2020   Middle cerebral artery embolism, 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 [place], [place], [address] Phone: [phone]   Fax:  [phone]  Name: Nathaniel Spears MRN: [id number] Date of Birth: [birth year]

## 2020-09-09 ENCOUNTER — Ambulatory Visit: Payer: Medicaid Other | Admitting: Physical Therapy

## 2020-09-09 ENCOUNTER — Ambulatory Visit: Payer: Medicaid Other

## 2020-09-09 ENCOUNTER — Other Ambulatory Visit: Payer: Self-pay

## 2020-09-13 ENCOUNTER — Other Ambulatory Visit: Payer: Self-pay

## 2020-09-13 ENCOUNTER — Ambulatory Visit: Payer: Medicaid Other

## 2020-09-13 ENCOUNTER — Ambulatory Visit: Payer: Medicaid Other | Admitting: Occupational Therapy

## 2020-09-13 ENCOUNTER — Encounter: Payer: Self-pay | Admitting: Occupational Therapy

## 2020-09-13 DIAGNOSIS — I69353 Hemiplegia and hemiparesis following cerebral infarction affecting right non-dominant side: Secondary | ICD-10-CM

## 2020-09-13 DIAGNOSIS — R2681 Unsteadiness on feet: Secondary | ICD-10-CM | POA: Diagnosis not present

## 2020-09-13 DIAGNOSIS — R41840 Attention and concentration deficit: Secondary | ICD-10-CM

## 2020-09-13 DIAGNOSIS — R278 Other lack of coordination: Secondary | ICD-10-CM

## 2020-09-13 DIAGNOSIS — R4701 Aphasia: Secondary | ICD-10-CM

## 2020-09-13 DIAGNOSIS — R41842 Visuospatial deficit: Secondary | ICD-10-CM

## 2020-09-13 DIAGNOSIS — R482 Apraxia: Secondary | ICD-10-CM

## 2020-09-13 NOTE — Therapy (Signed)
[facility] [address] [place], [place], [address] Phone: [phone]   Fax:  [phone]  Occupational Therapy Treatment  Patient Details  Name: Nathaniel Spears MRN: [id number] Date of Birth: [birth year] Referring Provider (OT): Lauraine Rinne PA-C   Encounter Date: 09/13/2020   OT End of Session - 09/13/20 1104     Visit Number 10    Number of Visits 17    Date for OT Re-Evaluation 11/03/20    Authorization Type Self Pay - did instruct in financial aid    OT Start Time 1102    OT Stop Time 1145    OT Time Calculation (min) 43 min    Activity Tolerance Patient tolerated treatment well    Behavior During Therapy [facility] for tasks assessed/performed             Past Medical History:  Diagnosis Date   Hypertension    Stroke [facility])     Past Surgical History:  Procedure Laterality Date   BUBBLE STUDY  06/01/2020   Procedure: BUBBLE STUDY;  Surgeon: Donato Heinz, MD;  Location: [facility];  Service: Cardiovascular;;   IR ANGIO INTRA EXTRACRAN SEL COM CAROTID INNOMINATE BILAT MOD SED  06/02/2020   IR ANGIO VERTEBRAL SEL SUBCLAVIAN INNOMINATE BILAT MOD SED  06/02/2020   TEE WITHOUT CARDIOVERSION N/A 06/01/2020   Procedure: TRANSESOPHAGEAL ECHOCARDIOGRAM (TEE);  Surgeon: Donato Heinz, MD;  Location: [facility];  Service: Cardiovascular;  Laterality: N/A;    There were no vitals filed for this visit.   Subjective Assessment - 09/13/20 1104     Subjective  Pt arrived without spouse today - spouse dropped off as she is back to work. Pt denies any pain.    Pertinent History admitted on 06/15/2020 with c/o aphasia, with multiple recent admits for repeated strokes. Pt most recently admitted 5/20-5/23 for acute strokes before being transferred at family request to [facility] for further work-up. While in the ED, pt had worsening aphasia compared to baseline. MRI reveals several new or increased acute infarcts  are  demonstrated in both cerebral hemispheres (today the bilateral MCA). 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 No/denies    Pain Score 0-No pain                 TREATMENT:  [employer] - near point copying with max verbal and visual cues for getting started. Pt started with left side and neglected right side at first. Pt hard time with copying pattern and downgraded to placing pieces directly on board.   [employer] - copying 3D pattern - pt with difficulty with scanning to right for pieces, sequencing with task and bimanual coordination. Pt with mod difficulty at first pattern but required max assistance for second pattern (sequencing of colors).  [employer] and matching colors and use of RUE for coordination and attention to right. Pt started off following directions well with min difficulty. When distractions present themselves, patient has hard time returning to task but was able to return to following task with increased time. Pt had hard time once got to right hemisphere of board as difficulty with attending to the right. Pt stopped at halfway and said "ok" as in being finished. Cueing required to attend to right side. Pt with intermittent pauses and increased processing time.   [employer] - with alternating colors with min difficulty. Pt unable to identify errors when  doubled a color but could tell he did something incorrectly.             OT Short Term Goals - 08/25/20 1017       OT SHORT TERM GOAL #1   Title Independent with bilateral coordination HEP and putty HEP for Rt hand    Time 4    Period Weeks    Status Achieved   w/ spouse assist     OT SHORT TERM GOAL #2   Title Pt will perform environmental scanning with 75% accuracy    Time 4    Period Weeks    Status On-going   08/25/20: found 6/7 items w/ cues     OT SHORT TERM GOAL #3   Title Pt will perform simple meal prep (cold  and/or microwaveable) and light housekeeping with min A and good safety awareness    Time 4    Period Weeks    Status Partially Met   beginning to use microwave (occasional cues required), making snacks/cereal, rinsing dishes     OT SHORT TERM GOAL #4   Title Pt to demo hooking/unhooking buttons on pants consistently    Time 4    Period Weeks    Status Achieved      OT SHORT TERM GOAL #5   Title Pt to cut food with A/E prn safely    Time 4    Period Weeks    Status On-going   Pt performed in clinic w/ initial hand over hand assist required d/t apraxia              OT Long Term Goals - 08/03/20 1006       OT LONG TERM GOAL #1   Title Pt will verbalize understanding of adapted strategies for increasing independence and safety awareness with ADLs and IADLs (i.e. right inattention for cooking, laundry, motor planning for IADLs)    Time 8    Period Weeks    Status New      OT LONG TERM GOAL #2   Title Pt to increase grip strength Rt hand to 50 lbs or greater    Baseline 44.9 lbs    Time 8    Period Weeks    Status New      OT LONG TERM GOAL #3   Title Pt will perform simple warm meal prep and light housekeeping with supervision and good safety awareness.    Time 8    Period Weeks    Status New      OT LONG TERM GOAL #4   Title Pt will improve coordination bilaterally by 10 sec or greater    Baseline Rt = 44.13 sec, Lt = 42.07 sec    Time 8    Period Weeks    Status New                   Plan - 09/13/20 1306     Clinical Impression Statement Pt w/ difficulty shifting b/t tasks and going back to same task if distracted. Pt requires increased time to process simple instructions d/t aphasia and cognitive deficits    OT Occupational Profile and History Detailed Assessment- Review of Records and additional review of physical, cognitive, psychosocial history related to current functional performance    Occupational performance deficits (Please refer to  evaluation for details): ADL's;IADL's;Leisure;Social Participation    Body Structure / Function / Physical Skills ADL;Coordination;Vision;IADL;UE functional use;Proprioception;Strength;Balance;Dexterity;Mobility;FMC    Cognitive Skills Attention;Sequencing;Perception;Understand;Memory;Problem Solve;Safety Awareness  difficult to assess formally due to aphasia and apraxia   Rehab Potential Good    Comorbidities Affecting Occupational Performance: May have comorbidities impacting occupational performance    Modification or Assistance to Complete Evaluation  Min-Moderate modification of tasks or assist with assess necessary to complete eval    OT Frequency 2x / week    OT Duration 8 weeks   OR 16 visits over 12 weeks due to scheduling conflicts (possibly only being seen 1x/wk some weeks)   OT Treatment/Interventions Self-care/ADL training;Cognitive remediation/compensation;Visual/perceptual remediation/compensation;Patient/family education;Therapeutic activities;Therapeutic exercise;Functional Mobility Training;DME and/or AE instruction;Neuromuscular education;Manual Therapy    Plan problem solve simple functional required tasks he can do at home I'ly when wife is working (from home)    Oncologist with Plan of Care Patient;Family member/caregiver    Family Member Consulted significant other, [name]             Patient will benefit from skilled therapeutic intervention in order to improve the following deficits and impairments:   Body Structure / Function / Physical Skills: ADL, Coordination, Vision, IADL, UE functional use, Proprioception, Strength, Balance, Dexterity, Mobility, FMC Cognitive Skills: Attention, Sequencing, Perception, Understand, Memory, Problem Solve, Safety Awareness (difficult to assess formally due to aphasia and apraxia)     Visit Diagnosis: Visuospatial deficit  Attention and concentration deficit  Other lack of coordination  Hemiplegia and hemiparesis  following cerebral infarction affecting right non-dominant side [facility])    Problem List Patient Active Problem List   Diagnosis Date Noted   AKI (acute kidney injury) ([facility]) 07/07/2020   Middle cerebral artery embolism, 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   Acute ischemic stroke ([facility]) 06/05/2020   Acute left-sided weakness 06/04/2020   Nicotine dependence 06/04/2020   Cocaine use 06/04/2020   Aphasia    Polysubstance abuse ([place])    Type 2 diabetes mellitus with hyperglycemia, with long-term current use of insulin ([place])    Hypokalemia    Acute CVA (cerebrovascular accident) ([facility]) 04/20/2020   Essential hypertension 08/21/2019   Obesity 08/21/2019    Zachery Conch MOT, OTR/L  09/13/2020, 1:06 PM  [facility] [address] [place], [place], [address] Phone: [phone]   Fax:  [phone]  Name: Nathaniel Spears MRN: [id number] Date of Birth: 07/24/1975

## 2020-09-13 NOTE — Therapy (Signed)
[facility] [address] [place], [place], [address] Phone: [phone]   Fax:  [phone]  Speech Language Pathology Treatment  Patient Details  Name: Nathaniel Spears MRN: [id number] Date of Birth: 05-27-1975 Referring Provider (SLP): Charlton Amor, PA-C   Encounter Date: 09/13/2020   End of Session - 09/13/20 1254     Visit Number 12    Number of Visits 25    Date for SLP Re-Evaluation 10/14/20    Authorization Type self-pay    SLP Start Time 1150    SLP Stop Time  1230    SLP Time Calculation (min) 40 min    Activity Tolerance Other (comment)   frustration            Past Medical History:  Diagnosis Date   Hypertension    Stroke [facility])     Past Surgical History:  Procedure Laterality Date   BUBBLE STUDY  06/01/2020   Procedure: BUBBLE STUDY;  Surgeon: Little Ishikawa, MD;  Location: [facility] ENDOSCOPY;  Service: Cardiovascular;;   IR ANGIO INTRA EXTRACRAN SEL COM CAROTID INNOMINATE BILAT MOD SED  06/02/2020   IR ANGIO VERTEBRAL SEL SUBCLAVIAN INNOMINATE BILAT MOD SED  06/02/2020   TEE WITHOUT CARDIOVERSION N/A 06/01/2020   Procedure: TRANSESOPHAGEAL ECHOCARDIOGRAM (TEE);  Surgeon: Little Ishikawa, MD;  Location: [facility] ENDOSCOPY;  Service: Cardiovascular;  Laterality: N/A;    There were no vitals filed for this visit.          ADULT SLP TREATMENT - 09/13/20 1247       General Information   Behavior/Cognition Alert;Cooperative;Pleasant mood;Requires cueing      Treatment Provided   Treatment provided Cognitive-Linquistic      Cognitive-Linquistic Treatment   Treatment focused on Apraxia;Aphasia    Skilled Treatment SLP used pictures of everyday activities to cue/assist pt in generating spoken language. Pt named the action in the picture 85% of the time and said action and object 50%. SLP then asked pt questions ("where did the kids play?", "When did they play", etc) to which pt able  to answer 80% of the time and then was able to spontaneously add this additional info to his utterance 70% of the time, to build a longer utterance. Suggested pt have some picturs in his phone of people and places pertinent to his life in order to foster verbal communication. Conssitent cues necessary to have pt slow his rate of speech, which hindered overall communication.      Assessment / Recommendations / Plan   Plan Continue with current plan of care      Progression Toward Goals   Progression toward goals Progressing toward goals              SLP Education - 09/13/20 1253     Education Details pictures may assist pt's verbal expression, slow rate in order to improve flow of his message    Person(s) Educated Patient    Methods Explanation    Comprehension Verbalized understanding              SLP Short Term Goals - 08/30/20 0956       SLP SHORT TERM GOAL #1   Title Pt will name 5-10 words in personally relevant categories with occasional mod A over 2 sessions    Baseline 08-13-20, 08-30-20    Status Achieved      SLP SHORT TERM GOAL #2   Title Pt will read sentences with ability to correct errors with 80%  accuracy given occasional mod A over 2 sessions    Baseline 08-12-20, 08-20-20    Status Achieved      SLP SHORT TERM GOAL #3   Title Pt will use external visual communication aids to augment verbal expression for communication of wants/needs for 4/5 opportunities with occasional min A over 2 sessions    Status Deferred      SLP SHORT TERM GOAL #4   Title Pt will complete picture description tasks by verbalizing at least 2 words for 8/10 opportunities with occasional mod A over 2 sessions    Baseline 08-27-20, 08-30-20    Status Achieved      SLP SHORT TERM GOAL #5   Title Pt will verbalize 2-3 word phrases in response to simple conversational dialogue with usual mod A over 2 sessions    Baseline 08-27-20, 08-30-20    Status Achieved              SLP Long Term  Goals - 09/13/20 1305       SLP LONG TERM GOAL #1   Title Pt will verbalize 3-4 word sentences in response to simple open ended questions with usual min A over 2 sessions    Baseline 09-01-20    Time 6    Period Weeks    Status On-going      SLP LONG TERM GOAL #2   Title Pt will use mulitmodal communication system to augment verbal expression to meet needs at home with occasional min A from family over 2 sessions    Time 6    Period Weeks    Status On-going      SLP LONG TERM GOAL #3   Title Pt/caregiver will report improvements in frustration level and communication effectivness via QOL scale by 2 points at last ST session    Baseline CPB: 0    Time 6    Period Weeks    Status On-going              Plan - 09/13/20 1254     Clinical Impression Statement Nathaniel Spears presented for OPST intervention to address apraxia and aphasia s/p multiple strokes in past 6 months. SLP educated patient and family on functional communication aids to support verbal communication and to utilize at home when patient becomes frustrated. Pt appeared more successful today with verbal expression given photographs - SLP suggested pt have photo sin his phone which he uses for communication. See "skilled treatment" for additional details of today's session. Skilled ST intervention is warranted to address moderate to severe communication deficits impacting patient's ability to participate in simple conversations and communicate wants and needs effectively.    Speech Therapy Frequency 2x / week    Duration 12 weeks    Treatment/Interventions Compensatory strategies;Cueing hierarchy;Functional tasks;Patient/family education;Cognitive reorganization;Multimodal communcation approach;Environmental controls;Compensatory techniques;Internal/external aids;SLP instruction and feedback;Language facilitation    Potential to Achieve Goals Fair    Potential Considerations Severity of impairments;Previous level of function     SLP Home Exercise Plan provided    Consulted and Agree with Plan of Care Patient;Family member/caregiver    Family Member Consulted Nathaniel Spears             Patient will benefit from skilled therapeutic intervention in order to improve the following deficits and impairments:   Verbal apraxia  Aphasia    Problem List Patient Active Problem List   Diagnosis Date Noted   AKI (acute kidney injury) ([facility]) 07/07/2020   Middle cerebral artery embolism, 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 [place], [place], [address] Phone: [phone]   Fax:  [phone]   Name: Nathaniel Spears MRN: [id number] Date of Birth: 1975-06-08

## 2020-09-14 ENCOUNTER — Ambulatory Visit: Payer: Medicaid Other | Admitting: Physical Therapy

## 2020-09-14 ENCOUNTER — Other Ambulatory Visit: Payer: Self-pay

## 2020-09-17 ENCOUNTER — Ambulatory Visit: Payer: Medicaid Other | Attending: Primary Care | Admitting: Occupational Therapy

## 2020-09-17 ENCOUNTER — Ambulatory Visit: Payer: Medicaid Other | Admitting: Physical Therapy

## 2020-09-17 ENCOUNTER — Other Ambulatory Visit: Payer: Self-pay

## 2020-09-17 ENCOUNTER — Encounter: Payer: Self-pay | Admitting: Physical Therapy

## 2020-09-17 DIAGNOSIS — R2681 Unsteadiness on feet: Secondary | ICD-10-CM | POA: Insufficient documentation

## 2020-09-17 DIAGNOSIS — R41840 Attention and concentration deficit: Secondary | ICD-10-CM | POA: Diagnosis present

## 2020-09-17 DIAGNOSIS — R4701 Aphasia: Secondary | ICD-10-CM | POA: Insufficient documentation

## 2020-09-17 DIAGNOSIS — R482 Apraxia: Secondary | ICD-10-CM | POA: Insufficient documentation

## 2020-09-17 DIAGNOSIS — R278 Other lack of coordination: Secondary | ICD-10-CM | POA: Insufficient documentation

## 2020-09-17 DIAGNOSIS — I69353 Hemiplegia and hemiparesis following cerebral infarction affecting right non-dominant side: Secondary | ICD-10-CM | POA: Insufficient documentation

## 2020-09-17 DIAGNOSIS — R2689 Other abnormalities of gait and mobility: Secondary | ICD-10-CM | POA: Diagnosis present

## 2020-09-17 DIAGNOSIS — R41842 Visuospatial deficit: Secondary | ICD-10-CM | POA: Diagnosis present

## 2020-09-17 DIAGNOSIS — M6281 Muscle weakness (generalized): Secondary | ICD-10-CM | POA: Insufficient documentation

## 2020-09-17 NOTE — Therapy (Signed)
[facility] [address] [place], [place], [address] Phone: [phone]   Fax:  [phone]  Physical Therapy Treatment/Discharge Summary  Patient Details  Name: Nathaniel Spears MRN: [id number] Date of Birth: [birth year] Referring Provider (PT): Raulkar (follow-up)   PHYSICAL THERAPY DISCHARGE SUMMARY  Visits from Start of Care: 12  Current functional level related to goals / functional outcomes:  PT Long Term Goals - 09/17/20 1344       PT LONG TERM GOAL #1   Title Pt will perform progression of HEP with family supervision for improved strength, balance, transfers, and gait.  TARGET 09/17/2020    Baseline No current HEP    Time 8    Period Weeks    Status Achieved      PT LONG TERM GOAL #2   Title Pt will improve gait velocity to at least 2.8 ft/sec for improved gait efficiency and safety.    Baseline 2.4 ft/sec    Time 8    Period Weeks    Status Achieved      PT LONG TERM GOAL #3   Title Pt will improve DGI score to at least 22/24 to decrease fall risk.    Baseline 14/24; 21/24 09/17/2020    Time 8    Period Weeks    Status Achieved      PT LONG TERM GOAL #4   Title Pt will ambulate at least 1000 ft, indoor and outdoor surfaces, independently, no LOB for improved community gait.    Baseline currently needs supervision for gait    Time 8    Period Weeks    Status Achieved            Pt has met all LTGs.   Remaining deficits: High level balance, (cognition/aphasia)   Education / Equipment: Educated in [employer], fall prevention (significant other has been present at all sessions for education, just not d/c session)   Patient agrees to discharge. Patient goals were met. Patient is being discharged due to meeting the stated rehab goals.  Mady Haagensen, PT 09/17/20 2:00 PM Phone: [phone] Fax: [phone]  Encounter Date: 09/17/2020   PT End of Session - 09/17/20 1350     Visit Number 12     Number of Visits 17    Date for PT Re-Evaluation 09/17/20    Authorization Type Self-Pay at eval; Medicaid potential    PT Start Time 0938    PT Stop Time 1829   discharge visit   PT Time Calculation (min) 31 min    Activity Tolerance Patient tolerated treatment well   Extra time and cues due to aphasia/apraxia(?)   Behavior During Therapy [facility] for tasks assessed/performed             Past Medical History:  Diagnosis Date   Hypertension    Stroke [facility])     Past Surgical History:  Procedure Laterality Date   BUBBLE STUDY  06/01/2020   Procedure: BUBBLE STUDY;  Surgeon: Donato Heinz, MD;  Location: [facility];  Service: Cardiovascular;;   IR ANGIO INTRA EXTRACRAN SEL COM CAROTID INNOMINATE BILAT MOD SED  06/02/2020   IR ANGIO VERTEBRAL SEL SUBCLAVIAN INNOMINATE BILAT MOD SED  06/02/2020   TEE WITHOUT CARDIOVERSION N/A 06/01/2020   Procedure: TRANSESOPHAGEAL ECHOCARDIOGRAM (TEE);  Surgeon: Donato Heinz, MD;  Location: [facility] ENDOSCOPY;  Service: Cardiovascular;  Laterality: N/A;    There were no vitals filed for this visit.   Subjective Assessment - 09/17/20 1317  Subjective No changes, no falls.  Feel balance is good.    Patient Stated Goals Pt wants to become more independent.    Currently in Pain? No/denies                               [facility] Adult PT Treatment/Exercise - 09/17/20 1319       Ambulation/Gait   Ambulation/Gait Yes    Ambulation/Gait Assistance 7: Independent    Ambulation Distance (Feet) 1000 Feet    Assistive device None    Gait Pattern Step-through pattern;Decreased arm swing - right;Decreased arm swing - left;Wide base of support;Ataxic    Ambulation Surface Level;Unlevel;Outdoor    Gait velocity 8.03 sec = 4.08 ft/sec      Dynamic Gait Index   Level Surface Normal    Change in Gait Speed Normal    Gait with Horizontal Head Turns Normal    Gait with Vertical Head Turns Normal    Gait and Pivot Turn  Normal    Step Over Obstacle Mild Impairment    Step Around Obstacles Normal    Steps Mild Impairment    Total Score 22              Access Code: [email or web address] URL: [email or web address] Date: 09/17/2020 Prepared by: Mady Haagensen  Exercises-Reviewed HEP given throughout therapy  Sit to Stand - 1 x daily - 5 x weekly - 1-2 sets - 10 reps Mini Squat with Counter Support - 1 x daily - 5 x weekly - 1-2 sets - 10 reps Standing Marching - 1 x daily - 5 x weekly - 1-2 sets - 10 reps Walking Tandem Stance - 1-2 x daily - 5 x weekly - 1 sets - 5 reps Walking with Head Rotation - 1-2 x daily - 5 x weekly - 1 sets - 3-5 reps Provided written handout for corner balance on pillow/towel             1) Feet apart, EO head turns/nods x 5 reps each             2) Feet apart, EC head steady x 10 sec-performed also with progression of EC head turns/head nods with cues for UE support             3) Feet together, EO head turns/nods x 5 reps each             4) Feet together, EC head steady x 10 sec-performed also with progression of EC head turns/nods with UE support      PT Education - 09/17/20 1350     Education Details Progress towards goals, POC/plans for d/c this visit    Person(s) Educated Patient    Methods Explanation    Comprehension Verbalized understanding              PT Short Term Goals - 08/20/20 1325       PT SHORT TERM GOAL #1   Title Pt will perform HEP with family supervision for improved strength, balance, transfers, and gait.    TARGET 08/20/2020    Baseline No current HEP    Time 4    Period Weeks    Status Achieved      PT SHORT TERM GOAL #2   Title Pt will improve 5x sit<>stand to less than or equal to 11.5 sec to demonstrate improved functional strength and transfer efficiency.    Baseline 13.53 sec; 8/522:  13.10 1st trial; 10.66 sec    Time 4    Period Weeks    Status Achieved      PT SHORT TERM GOAL #3   Title Pt will improve DGI score to  at least 17/24 to decrease fall risk.    Baseline 14/24 at eval; 08/20/20:  17/24    Time 4    Period Weeks    Status Achieved      PT SHORT TERM GOAL #4   Title Pt will verbalize understanding of fall prevention in home environment.    Baseline at fall risk per DGI    Time 4    Period Weeks    Status Achieved               PT Long Term Goals - 09/17/20 1344       PT LONG TERM GOAL #1   Title Pt will perform progression of HEP with family supervision for improved strength, balance, transfers, and gait.  TARGET 09/17/2020    Baseline No current HEP    Time 8    Period Weeks    Status Achieved      PT LONG TERM GOAL #2   Title Pt will improve gait velocity to at least 2.8 ft/sec for improved gait efficiency and safety.    Baseline 2.4 ft/sec    Time 8    Period Weeks    Status Achieved      PT LONG TERM GOAL #3   Title Pt will improve DGI score to at least 22/24 to decrease fall risk.    Baseline 14/24; 21/24 09/17/2020    Time 8    Period Weeks    Status Achieved      PT LONG TERM GOAL #4   Title Pt will ambulate at least 1000 ft, indoor and outdoor surfaces, independently, no LOB for improved community gait.    Baseline currently needs supervision for gait    Time 8    Period Weeks    Status Achieved                   Plan - 09/17/20 1351     Clinical Impression Statement Assessed LTGs this visit, with pt meeting all LTGs.  Pt has demonstrated improved gait velocity, improved ease of transfers, improved gait and balance during the course of therapy.  He continues to require supervision at times for directional purposes, but otherwise, he is safe with gait on indoor and outdoor surfaces.  He is appropriate for d/c at this time.    Personal Factors and Comorbidities Comorbidity 3+    Comorbidities PMH:  DM, HTN, HLD    Examination-Activity Limitations Locomotion Level;Transfers    Examination-Participation Restrictions Community Activity;Occupation     Stability/Clinical Decision Making Evolving/Moderate complexity    Rehab Potential Good    PT Frequency 2x / week    PT Duration 8 weeks   plus eval   PT Treatment/Interventions ADLs/Self Care Home Management;Gait training;Stair training;Functional mobility training;Therapeutic activities;Therapeutic exercise;Balance training;Neuromuscular re-education;Patient/family education    PT Next Visit Plan D/C this visit.    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 09/17/2020, 1:55 PM Frazier Butt., PT   [facility] [address] [place], [place], [address] Phone: [phone]   Fax:  [phone]  Name: Nathaniel Spears MRN: [id number] Date of Birth: 09/29/1975

## 2020-09-17 NOTE — Therapy (Signed)
[facility] [address] [place], [place], [address] Phone: [phone]   Fax:  [phone]  Occupational Therapy Treatment  Patient Details  Name: Nathaniel Spears MRN: [id number] Date of Birth: [birth year] Referring Provider (OT): Lauraine Rinne PA-C   Encounter Date: 09/17/2020   OT End of Session - 09/17/20 1233     Visit Number 11    Number of Visits 17    Date for OT Re-Evaluation 11/03/20    Authorization Type Self Pay - did instruct in financial aid    OT Start Time 1230    OT Stop Time 1315    OT Time Calculation (min) 45 min    Activity Tolerance Patient tolerated treatment well    Behavior During Therapy [facility] for tasks assessed/performed             Past Medical History:  Diagnosis Date   Hypertension    Stroke [facility])     Past Surgical History:  Procedure Laterality Date   BUBBLE STUDY  06/01/2020   Procedure: BUBBLE STUDY;  Surgeon: Donato Heinz, MD;  Location: [facility];  Service: Cardiovascular;;   IR ANGIO INTRA EXTRACRAN SEL COM CAROTID INNOMINATE BILAT MOD SED  06/02/2020   IR ANGIO VERTEBRAL SEL SUBCLAVIAN INNOMINATE BILAT MOD SED  06/02/2020   TEE WITHOUT CARDIOVERSION N/A 06/01/2020   Procedure: TRANSESOPHAGEAL ECHOCARDIOGRAM (TEE);  Surgeon: Donato Heinz, MD;  Location: [facility] ENDOSCOPY;  Service: Cardiovascular;  Laterality: N/A;    There were no vitals filed for this visit.   Subjective Assessment - 09/17/20 1232     Subjective  Pt denies any pain and reports no changes. Spouse not with patient today.    Pertinent History admitted on 06/15/2020 with c/o aphasia, with multiple recent admits for repeated strokes. Pt most recently admitted 5/20-5/23 for acute strokes before being transferred at family request to [facility] for further work-up. While in the ED, pt had worsening aphasia compared to baseline. MRI reveals several new or increased acute infarcts are  demonstrated in  both cerebral hemispheres (today the bilateral MCA). 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 No/denies                          OT Treatments/Exercises (OP) - 09/17/20 0001       Exercises   Exercises Hand      Cognitive Exercises   Other Cognitive Exercises 1 Same symbol level 2 on constant therapy - did not finish d/t time constraint. Consistent errors on right.      Hand Exercises   Other Hand Exercises hand gripper level 3 with black spring with RUE for grip strengthening - no difficulty and no drops.      Visual/Perceptual Exercises   Scanning Tabletop    Scanning - Dentist board with emphasis on scanning. Pt with mod verbal cue sfor scanning to right and increased time required for spatial relations and locating shapes    Visual Motor Integration Spatial relationship with tanagram/parquetry puzzles - mod cues for beginning each puzzle with organizing the pieces. Pt unable to initatie beginning completing of puzzles without cues                      OT Short Term Goals - 08/25/20 1017       OT SHORT TERM GOAL #1   Title Independent  with bilateral coordination HEP and putty HEP for Rt hand    Time 4    Period Weeks    Status Achieved   w/ spouse assist     OT SHORT TERM GOAL #2   Title Pt will perform environmental scanning with 75% accuracy    Time 4    Period Weeks    Status On-going   08/25/20: found 6/7 items w/ cues     OT SHORT TERM GOAL #3   Title Pt will perform simple meal prep (cold and/or microwaveable) and light housekeeping with min A and good safety awareness    Time 4    Period Weeks    Status Partially Met   beginning to use microwave (occasional cues required), making snacks/cereal, rinsing dishes     OT SHORT TERM GOAL #4   Title Pt to demo hooking/unhooking buttons on pants consistently    Time 4    Period Weeks    Status Achieved      OT  SHORT TERM GOAL #5   Title Pt to cut food with A/E prn safely    Time 4    Period Weeks    Status On-going   Pt performed in clinic w/ initial hand over hand assist required d/t apraxia              OT Long Term Goals - 08/03/20 1006       OT LONG TERM GOAL #1   Title Pt will verbalize understanding of adapted strategies for increasing independence and safety awareness with ADLs and IADLs (i.e. right inattention for cooking, laundry, motor planning for IADLs)    Time 8    Period Weeks    Status New      OT LONG TERM GOAL #2   Title Pt to increase grip strength Rt hand to 50 lbs or greater    Baseline 44.9 lbs    Time 8    Period Weeks    Status New      OT LONG TERM GOAL #3   Title Pt will perform simple warm meal prep and light housekeeping with supervision and good safety awareness.    Time 8    Period Weeks    Status New      OT LONG TERM GOAL #4   Title Pt will improve coordination bilaterally by 10 sec or greater    Baseline Rt = 44.13 sec, Lt = 42.07 sec    Time 8    Period Weeks    Status New                   Plan - 09/17/20 1313     Clinical Impression Statement Poor carryover of strategies for scanning. Slow progress d/t cognitive deficits.    OT Occupational Profile and History Detailed Assessment- Review of Records and additional review of physical, cognitive, psychosocial history related to current functional performance    Occupational performance deficits (Please refer to evaluation for details): ADL's;IADL's;Leisure;Social Participation    Body Structure / Function / Physical Skills ADL;Coordination;Vision;IADL;UE functional use;Proprioception;Strength;Balance;Dexterity;Mobility;FMC    Cognitive Skills Attention;Sequencing;Perception;Understand;Memory;Problem Solve;Safety Awareness   difficult to assess formally due to aphasia and apraxia   Rehab Potential Good    Comorbidities Affecting Occupational Performance: May have comorbidities  impacting occupational performance    Modification or Assistance to Complete Evaluation  Min-Moderate modification of tasks or assist with assess necessary to complete eval    OT Frequency 2x / week  OT Duration 8 weeks   OR 16 visits over 12 weeks due to scheduling conflicts (possibly only being seen 1x/wk some weeks)   OT Treatment/Interventions Self-care/ADL training;Cognitive remediation/compensation;Visual/perceptual remediation/compensation;Patient/family education;Therapeutic activities;Therapeutic exercise;Functional Mobility Training;DME and/or AE instruction;Neuromuscular education;Manual Therapy    Plan problem solve simple functional required tasks he can do at home I'ly when wife is working (from home)    Oncologist with Plan of Care Patient;Family member/caregiver    Family Member Consulted significant other, [name]             Patient will benefit from skilled therapeutic intervention in order to improve the following deficits and impairments:   Body Structure / Function / Physical Skills: ADL, Coordination, Vision, IADL, UE functional use, Proprioception, Strength, Balance, Dexterity, Mobility, FMC Cognitive Skills: Attention, Sequencing, Perception, Understand, Memory, Problem Solve, Safety Awareness (difficult to assess formally due to aphasia and apraxia)     Visit Diagnosis: Unsteadiness on feet  Visuospatial deficit  Attention and concentration deficit  Other lack of coordination  Hemiplegia and hemiparesis following cerebral infarction affecting right non-dominant side [facility])    Problem List Patient Active Problem List   Diagnosis Date Noted   AKI (acute kidney injury) ([place]) 07/07/2020   Middle cerebral artery embolism, 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 [place], [place], [address] Phone: [phone]   Fax:  [phone]  Name: Nathaniel Spears MRN: [id number] Date of Birth: 1975-12-30

## 2020-09-22 ENCOUNTER — Ambulatory Visit: Payer: Medicaid Other | Admitting: Occupational Therapy

## 2020-09-22 ENCOUNTER — Ambulatory Visit: Payer: Medicaid Other

## 2020-09-24 ENCOUNTER — Other Ambulatory Visit: Payer: Self-pay

## 2020-09-24 ENCOUNTER — Ambulatory Visit: Payer: Medicaid Other | Admitting: Occupational Therapy

## 2020-09-27 ENCOUNTER — Ambulatory Visit: Payer: Medicaid Other | Admitting: Occupational Therapy

## 2020-09-27 ENCOUNTER — Ambulatory Visit: Payer: Medicaid Other

## 2020-09-29 ENCOUNTER — Other Ambulatory Visit: Payer: Self-pay

## 2020-09-29 ENCOUNTER — Ambulatory Visit: Payer: Medicaid Other | Admitting: Occupational Therapy

## 2020-09-29 ENCOUNTER — Ambulatory Visit: Payer: Medicaid Other

## 2020-09-29 DIAGNOSIS — R2689 Other abnormalities of gait and mobility: Secondary | ICD-10-CM

## 2020-09-29 DIAGNOSIS — R2681 Unsteadiness on feet: Secondary | ICD-10-CM | POA: Diagnosis not present

## 2020-09-29 DIAGNOSIS — R278 Other lack of coordination: Secondary | ICD-10-CM

## 2020-09-29 DIAGNOSIS — R4701 Aphasia: Secondary | ICD-10-CM

## 2020-09-29 DIAGNOSIS — R41842 Visuospatial deficit: Secondary | ICD-10-CM

## 2020-09-29 DIAGNOSIS — R482 Apraxia: Secondary | ICD-10-CM

## 2020-09-29 DIAGNOSIS — R41840 Attention and concentration deficit: Secondary | ICD-10-CM

## 2020-09-29 DIAGNOSIS — M6281 Muscle weakness (generalized): Secondary | ICD-10-CM

## 2020-09-29 NOTE — Therapy (Signed)
[facility] [address] [place], [place], [address] Phone: [phone]   Fax:  [phone]  Speech Language Pathology Treatment  Patient Details  Name: Nathaniel Spears MRN: [id number] Date of Birth: 01/15/1976 Referring Provider (SLP): Nathaniel Amor, PA-C   Encounter Date: 09/29/2020   End of Session - 09/29/20 1432     Visit Number 13    Number of Visits 25    Date for SLP Re-Evaluation 10/14/20    Authorization Type self-pay    SLP Start Time 1230    SLP Stop Time  1315    SLP Time Calculation (min) 45 min    Activity Tolerance Patient tolerated treatment well             Past Medical History:  Diagnosis Date   Hypertension    Stroke [facility])     Past Surgical History:  Procedure Laterality Date   BUBBLE STUDY  06/01/2020   Procedure: BUBBLE STUDY;  Surgeon: Little Ishikawa, MD;  Location: [facility] ENDOSCOPY;  Service: Cardiovascular;;   IR ANGIO INTRA EXTRACRAN SEL COM CAROTID INNOMINATE BILAT MOD SED  06/02/2020   IR ANGIO VERTEBRAL SEL SUBCLAVIAN INNOMINATE BILAT MOD SED  06/02/2020   TEE WITHOUT CARDIOVERSION N/A 06/01/2020   Procedure: TRANSESOPHAGEAL ECHOCARDIOGRAM (TEE);  Surgeon: Little Ishikawa, MD;  Location: [facility] ENDOSCOPY;  Service: Cardiovascular;  Laterality: N/A;    There were no vitals filed for this visit.   Subjective Assessment - 09/29/20 1231     Subjective "not much"    Currently in Pain? No/denies                   ADULT SLP TREATMENT - 09/29/20 1231       General Information   Behavior/Cognition Alert;Cooperative;Pleasant mood;Requires cueing      Treatment Provided   Treatment provided Cognitive-Linquistic      Cognitive-Linquistic Treatment   Treatment focused on Apraxia;Aphasia    Skilled Treatment Pt returns alone to ST after a few week gap. Pt reports speech is "going good." Improved ability to converse noted this session with reduced anomia and  increased MLU exhibited (ex: "she put me on a bus this morning"). Pt noted to become easily frustrated and required usual cues to slow down speech rate in conversation. SLP targeted divergent naming of state fair items, in which pt was 100% accurate with visuals. Pt able to name 5 state fair items independently. SLP targeted naming for 4-step picture sequences, in which pt named with 50% accuracy independently. With occasional min to mod questioning cues, accuracy improved to 92% accuracy.      Assessment / Recommendations / Plan   Plan Continue with current plan of care      Progression Toward Goals   Progression toward goals Progressing toward goals              SLP Education - 09/29/20 1432     Education Details improvements in conversation, slow rate, HEP    Person(s) Educated Patient    Methods Explanation;Demonstration;Handout;Verbal cues    Comprehension Verbalized understanding;Returned demonstration;Verbal cues required;Need further instruction              SLP Short Term Goals - 08/30/20 0956       SLP SHORT TERM GOAL #1   Title Pt will name 5-10 words in personally relevant categories with occasional mod A over 2 sessions    Baseline 08-13-20, 08-30-20    Status Achieved  SLP SHORT TERM GOAL #2   Title Pt will read sentences with ability to correct errors with 80% accuracy given occasional mod A over 2 sessions    Baseline 08-12-20, 08-20-20    Status Achieved      SLP SHORT TERM GOAL #3   Title Pt will use external visual communication aids to augment verbal expression for communication of wants/needs for 4/5 opportunities with occasional min A over 2 sessions    Status Deferred      SLP SHORT TERM GOAL #4   Title Pt will complete picture description tasks by verbalizing at least 2 words for 8/10 opportunities with occasional mod A over 2 sessions    Baseline 08-27-20, 08-30-20    Status Achieved      SLP SHORT TERM GOAL #5   Title Pt will verbalize 2-3 word  phrases in response to simple conversational dialogue with usual mod A over 2 sessions    Baseline 08-27-20, 08-30-20    Status Achieved              SLP Long Term Goals - 09/29/20 1434       SLP LONG TERM GOAL #1   Title Pt will verbalize 3-4 word sentences in response to simple open ended questions with usual min A over 2 sessions    Baseline 09-01-20, 09-29-20    Status Achieved      SLP LONG TERM GOAL #2   Title Pt will use mulitmodal communication system to augment verbal expression to meet needs at home with occasional min A from family over 2 sessions    Time 5    Period Weeks    Status On-going      SLP LONG TERM GOAL #3   Title Pt/caregiver will report improvements in frustration level and communication effectivness via QOL scale by 2 points at last ST session    Baseline CPB: 0    Time 5    Period Weeks    Status On-going              Plan - 09/29/20 1433     Clinical Impression Statement [name] presented for OPST intervention to address apraxia and aphasia s/p multiple strokes in past 6 months. Pt appeared more successful today with verbal expression in conversation and given pictures this session. See "skilled treatment" for additional details of today's session. Skilled ST intervention is warranted to address moderate to severe communication deficits impacting patient's ability to participate in simple conversations and communicate wants and needs effectively.    Speech Therapy Frequency 2x / week    Duration 12 weeks    Treatment/Interventions Compensatory strategies;Cueing hierarchy;Functional tasks;Patient/family education;Cognitive reorganization;Multimodal communcation approach;Environmental controls;Compensatory techniques;Internal/external aids;SLP instruction and feedback;Language facilitation    Potential to Achieve Goals Fair    Potential Considerations Severity of impairments;Previous level of function    SLP Home Exercise Plan provided    Consulted  and Agree with Plan of Care Patient             Patient will benefit from skilled therapeutic intervention in order to improve the following deficits and impairments:   Aphasia  Verbal apraxia    Problem List Patient Active Problem List   Diagnosis Date Noted   AKI (acute kidney injury) (HCC) 07/07/2020   Middle cerebral artery embolism, 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 [place], [place], [address] Phone: [phone]   Fax:  [phone]   Name: Nathaniel Spears MRN: [id number] Date of Birth: 08/11/1975

## 2020-09-29 NOTE — Therapy (Signed)
[facility] [address] [place], [place], [address] Phone: [phone]   Fax:  [phone]  Occupational Therapy Treatment  Patient Details  Name: Nathaniel Spears MRN: [id number] Date of Birth: 03-13-1975 Referring Provider (OT): Lauraine Rinne PA-C   Encounter Date: 09/29/2020   OT End of Session - 09/29/20 1150     Visit Number 12    Number of Visits 17    Date for OT Re-Evaluation 11/03/20    Authorization Type Self Pay - did instruct in financial aid    OT Start Time 1149    OT Stop Time 1230    OT Time Calculation (min) 41 min    Activity Tolerance Patient tolerated treatment well    Behavior During Therapy [facility] for tasks assessed/performed             Past Medical History:  Diagnosis Date   Hypertension    Stroke [facility])     Past Surgical History:  Procedure Laterality Date   BUBBLE STUDY  06/01/2020   Procedure: BUBBLE STUDY;  Surgeon: Donato Heinz, MD;  Location: [place];  Service: Cardiovascular;;   IR ANGIO INTRA EXTRACRAN SEL COM CAROTID INNOMINATE BILAT MOD SED  06/02/2020   IR ANGIO VERTEBRAL SEL SUBCLAVIAN INNOMINATE BILAT MOD SED  06/02/2020   TEE WITHOUT CARDIOVERSION N/A 06/01/2020   Procedure: TRANSESOPHAGEAL ECHOCARDIOGRAM (TEE);  Surgeon: Donato Heinz, MD;  Location: [facility] ENDOSCOPY;  Service: Cardiovascular;  Laterality: N/A;    There were no vitals filed for this visit.   Subjective Assessment - 09/29/20 1150     Subjective  "nothing - they're good"    Pertinent History admitted on 06/15/2020 with c/o aphasia, with multiple recent admits for repeated strokes. Pt most recently admitted 5/20-5/23 for acute strokes before being transferred at family request to [facility] for further work-up. While in the ED, pt had worsening aphasia compared to baseline. MRI reveals several new or increased acute infarcts are  demonstrated in both cerebral hemispheres (today the bilateral  MCA). 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 No/denies    Pain Score 0-No pain              Checked grip strength: met goal.  Small Pegboard with RUE with copying pattern. Max ues required for using RUE and for following pattern. Needed to eliminate other parts of image to focus on pattern to replicate.   Perfection with increased time and mod/max cues for visual strategies for scanning and attending to the right. Pt req'd increased time for completing activity. Needed cueing for using strategies after approx 30 seconds.  Sorting Cards w/ cues and reminders for using RUE. Pt sorted and put them in order with min difficulty.                   OT Education - 09/29/20 1151     Education Details educated patient on discharge plan for next session. Spouse not present for session and unable to be educated on plan. Pateint verbalized understanding and is in agreement but difficult to assess understanding d/t aphasia.    Person(s) Educated Patient    Methods Explanation;Verbal cues    Comprehension Verbalized understanding;Need further instruction              OT Short Term Goals - 08/25/20 1017       OT SHORT TERM GOAL #1   Title Independent  with bilateral coordination HEP and putty HEP for Rt hand    Time 4    Period Weeks    Status Achieved   w/ spouse assist     OT SHORT TERM GOAL #2   Title Pt will perform environmental scanning with 75% accuracy    Time 4    Period Weeks    Status On-going   08/25/20: found 6/7 items w/ cues     OT SHORT TERM GOAL #3   Title Pt will perform simple meal prep (cold and/or microwaveable) and light housekeeping with min A and good safety awareness    Time 4    Period Weeks    Status Partially Met   beginning to use microwave (occasional cues required), making snacks/cereal, rinsing dishes     OT SHORT TERM GOAL #4   Title Pt to demo hooking/unhooking  buttons on pants consistently    Time 4    Period Weeks    Status Achieved      OT SHORT TERM GOAL #5   Title Pt to cut food with A/E prn safely    Time 4    Period Weeks    Status On-going   Pt performed in clinic w/ initial hand over hand assist required d/t apraxia              OT Long Term Goals - 09/29/20 1153       OT LONG TERM GOAL #1   Title Pt will verbalize understanding of adapted strategies for increasing independence and safety awareness with ADLs and IADLs (i.e. right inattention for cooking, laundry, motor planning for IADLs)    Time 8    Period Weeks    Status New      OT LONG TERM GOAL #2   Title Pt to increase grip strength Rt hand to 50 lbs or greater    Baseline 44.9 lbs    Time 8    Period Weeks    Status Achieved   61.9 lbs with RUE 09/29/20     OT LONG TERM GOAL #3   Title Pt will perform simple warm meal prep and light housekeeping with supervision and good safety awareness.    Time 8    Period Weeks    Status On-going   pt reports cooking and cleaning but unable to specify. 09/29/20     OT LONG TERM GOAL #4   Title Pt will improve coordination bilaterally by 10 sec or greater    Baseline Rt = 44.13 sec, Lt = 42.07 sec    Time 8    Period Weeks    Status On-going                   Plan - 09/29/20 1207     Clinical Impression Statement Pt with slow and steady progress but limited d/t poor carryover of strategies for visual scanning.    OT Occupational Profile and History Detailed Assessment- Review of Records and additional review of physical, cognitive, psychosocial history related to current functional performance    Occupational performance deficits (Please refer to evaluation for details): ADL's;IADL's;Leisure;Social Participation    Body Structure / Function / Physical Skills ADL;Coordination;Vision;IADL;UE functional use;Proprioception;Strength;Balance;Dexterity;Mobility;FMC    Cognitive Skills  Attention;Sequencing;Perception;Understand;Memory;Problem Solve;Safety Awareness   difficult to assess formally due to aphasia and apraxia   Rehab Potential Good    Comorbidities Affecting Occupational Performance: May have comorbidities impacting occupational performance    Modification or Assistance to Complete Evaluation  Min-Moderate  modification of tasks or assist with assess necessary to complete eval    OT Frequency 2x / week    OT Duration 8 weeks   OR 16 visits over 12 weeks due to scheduling conflicts (possibly only being seen 1x/wk some weeks)   OT Treatment/Interventions Self-care/ADL training;Cognitive remediation/compensation;Visual/perceptual remediation/compensation;Patient/family education;Therapeutic activities;Therapeutic exercise;Functional Mobility Training;DME and/or AE instruction;Neuromuscular education;Manual Therapy    Plan discharge at end of schedule visits, discuss tasks to do at home for cog and visual development    Consulted and Agree with Plan of Care Patient;Family member/caregiver    Family Member Consulted significant other, [name]             Patient will benefit from skilled therapeutic intervention in order to improve the following deficits and impairments:   Body Structure / Function / Physical Skills: ADL, Coordination, Vision, IADL, UE functional use, Proprioception, Strength, Balance, Dexterity, Mobility, FMC Cognitive Skills: Attention, Sequencing, Perception, Understand, Memory, Problem Solve, Safety Awareness (difficult to assess formally due to aphasia and apraxia)     Visit Diagnosis: Other abnormalities of gait and mobility  Unsteadiness on feet  Muscle weakness (generalized)  Visuospatial deficit  Attention and concentration deficit  Other lack of coordination  Apraxia    Problem List Patient Active Problem List   Diagnosis Date Noted   AKI (acute kidney injury) (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   Acute ischemic stroke ([place]) 06/05/2020   Acute left-sided weakness 06/04/2020   Nicotine dependence 06/04/2020   Cocaine use 06/04/2020   Aphasia    Polysubstance abuse ([place])    Type 2 diabetes mellitus with hyperglycemia, with long-term current use of insulin (HCC)    Hypokalemia    Acute CVA (cerebrovascular accident) ([place]) 04/20/2020   Essential hypertension 08/21/2019   Obesity 08/21/2019    Zachery Conch, OT/L 09/29/2020, 1:23 PM  [facility] [address] [place], [place], [address] Phone: [phone]   Fax:  [phone]  Name: Antion Andres MRN: [id number] Date of Birth: September 09, 1975

## 2020-10-01 ENCOUNTER — Other Ambulatory Visit: Payer: Self-pay

## 2020-10-04 ENCOUNTER — Ambulatory Visit: Payer: Medicaid Other

## 2020-10-04 ENCOUNTER — Encounter: Payer: Self-pay | Admitting: Occupational Therapy

## 2020-10-04 ENCOUNTER — Ambulatory Visit: Payer: Medicaid Other | Admitting: Occupational Therapy

## 2020-10-04 ENCOUNTER — Other Ambulatory Visit: Payer: Self-pay

## 2020-10-04 DIAGNOSIS — R2689 Other abnormalities of gait and mobility: Secondary | ICD-10-CM

## 2020-10-04 DIAGNOSIS — R41842 Visuospatial deficit: Secondary | ICD-10-CM

## 2020-10-04 DIAGNOSIS — R2681 Unsteadiness on feet: Secondary | ICD-10-CM | POA: Diagnosis not present

## 2020-10-04 DIAGNOSIS — R482 Apraxia: Secondary | ICD-10-CM

## 2020-10-04 DIAGNOSIS — R41840 Attention and concentration deficit: Secondary | ICD-10-CM

## 2020-10-04 DIAGNOSIS — R4701 Aphasia: Secondary | ICD-10-CM

## 2020-10-04 DIAGNOSIS — I69353 Hemiplegia and hemiparesis following cerebral infarction affecting right non-dominant side: Secondary | ICD-10-CM

## 2020-10-04 DIAGNOSIS — R278 Other lack of coordination: Secondary | ICD-10-CM

## 2020-10-04 DIAGNOSIS — M6281 Muscle weakness (generalized): Secondary | ICD-10-CM

## 2020-10-04 NOTE — Therapy (Signed)
[facility] [address] [place], [place], [address] Phone: [phone]   Fax:  [phone]  Occupational Therapy Treatment & Discharge  Patient Details  Name: Nathaniel Spears MRN: [id number] Date of Birth: 1975-04-14 Referring Provider (OT): Lauraine Rinne PA-C   Encounter Date: 10/04/2020   OT End of Session - 10/04/20 1451     Visit Number 13    Number of Visits 17    Date for OT Re-Evaluation 11/03/20    Authorization Type Self Pay - did instruct in financial aid    OT Start Time 1449    OT Stop Time 1530    OT Time Calculation (min) 41 min    Activity Tolerance Patient tolerated treatment well    Behavior During Therapy [facility] for tasks assessed/performed             Past Medical History:  Diagnosis Date   Hypertension    Stroke [facility])     Past Surgical History:  Procedure Laterality Date   BUBBLE STUDY  06/01/2020   Procedure: BUBBLE STUDY;  Surgeon: Donato Heinz, MD;  Location: [place];  Service: Cardiovascular;;   IR ANGIO INTRA EXTRACRAN SEL COM CAROTID INNOMINATE BILAT MOD SED  06/02/2020   IR ANGIO VERTEBRAL SEL SUBCLAVIAN INNOMINATE BILAT MOD SED  06/02/2020   TEE WITHOUT CARDIOVERSION N/A 06/01/2020   Procedure: TRANSESOPHAGEAL ECHOCARDIOGRAM (TEE);  Surgeon: Donato Heinz, MD;  Location: [facility] ENDOSCOPY;  Service: Cardiovascular;  Laterality: N/A;    There were no vitals filed for this visit.   Subjective Assessment - 10/04/20 1450     Subjective  "yes" wen asked if S.O. dropped off and then asked about transportation and patient reported "yes". Pt was not accompanied by anyone today and arrived alone to therapy. Unable to fully get report of how things are going d/t aphasia.    Pertinent History admitted on 06/15/2020 with c/o aphasia, with multiple recent admits for repeated strokes. Pt most recently admitted 5/20-5/23 for acute strokes before being transferred at family  request to [facility] for further work-up. While in the ED, pt had worsening aphasia compared to baseline. MRI reveals several new or increased acute infarcts are  demonstrated in both cerebral hemispheres (today the bilateral MCA). 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 No/denies    Pain Score 0-No pain                 OCCUPATIONAL THERAPY DISCHARGE SUMMARY  Visits from Start of Care: 13  Current functional level related to goals / functional outcomes: Pt met all goals and demonstrated improvement with apraxia, sequencing, scanning, coordination, grip strength and attention.   Remaining deficits: Pt continues to be limited by cognitive and language deficits.    Education / Equipment: HEP and letter for significant other for activities and exercises for home.   Patient agrees to discharge. Patient goals were met. Patient is being discharged due to maximized rehab potential. .                    OT Education - 10/04/20 1532     Education Details sent letter home with patient for significant other/caregiver since patient severely aphasic about discharge instructions and activities to do at home for visual scanning and attention and apraxia.    Person(s) Educated Patient;Spouse    Methods Explanation;Handout    Comprehension Verbalized understanding  OT Short Term Goals - 10/04/20 1501       OT SHORT TERM GOAL #1   Title Independent with bilateral coordination HEP and putty HEP for Rt hand    Time 4    Period Weeks    Status Achieved   w/ spouse assist     OT SHORT TERM GOAL #2   Title Pt will perform environmental scanning with 75% accuracy    Time 4    Period Weeks    Status Achieved   10/04/20 75% accuracy     OT SHORT TERM GOAL #3   Title Pt will perform simple meal prep (cold and/or microwaveable) and light housekeeping with min A and good safety awareness    Time 4     Period Weeks    Status Achieved   per pt report using microwave and making cereal     OT SHORT TERM GOAL #4   Title Pt to demo hooking/unhooking buttons on pants consistently    Time 4    Period Weeks    Status Achieved      OT SHORT TERM GOAL #5   Title Pt to cut food with A/E prn safely    Time 4    Period Weeks    Status Achieved   simulated in clinic with mod I              OT Long Term Goals - 10/04/20 1506       OT LONG TERM GOAL #1   Title Pt will verbalize understanding of adapted strategies for increasing independence and safety awareness with ADLs and IADLs (i.e. right inattention for cooking, laundry, motor planning for IADLs)    Time 8    Period Weeks    Status Achieved      OT LONG TERM GOAL #2   Title Pt to increase grip strength Rt hand to 50 lbs or greater    Baseline 44.9 lbs    Time 8    Period Weeks    Status Achieved   61.9 lbs with RUE 09/29/20     OT LONG TERM GOAL #3   Title Pt will perform simple warm meal prep and light housekeeping with supervision and good safety awareness.    Time 8    Period Weeks    Status Unable to assess   10/04/20 - reports simple (microwave and cold cereal etc and doing laundry) with supervision at home. Unable to fully assess d/t aphasia     OT LONG TERM GOAL #4   Title Pt will improve coordination bilaterally by 10 sec or greater    Baseline Rt = 44.13 sec, Lt = 42.07 sec    Time 8    Period Weeks    Status On-going   10/04/20 LUE 23.13s, RUE 24.09s                  Plan - 10/04/20 1631     Clinical Impression Statement Pt progressed and met all goals. Pt discharging today from OT.    OT Occupational Profile and History Detailed Assessment- Review of Records and additional review of physical, cognitive, psychosocial history related to current functional performance    Occupational performance deficits (Please refer to evaluation for details): ADL's;IADL's;Leisure;Social Participation    Body Structure  / Function / Physical Skills ADL;Coordination;Vision;IADL;UE functional use;Proprioception;Strength;Balance;Dexterity;Mobility;FMC    Cognitive Skills Attention;Sequencing;Perception;Understand;Memory;Problem Solve;Safety Awareness   difficult to assess formally due to aphasia and apraxia   Rehab Potential Good  Comorbidities Affecting Occupational Performance: May have comorbidities impacting occupational performance    Modification or Assistance to Complete Evaluation  Min-Moderate modification of tasks or assist with assess necessary to complete eval    OT Frequency 2x / week    OT Duration 8 weeks   OR 16 visits over 12 weeks due to scheduling conflicts (possibly only being seen 1x/wk some weeks)   OT Treatment/Interventions Self-care/ADL training;Cognitive remediation/compensation;Visual/perceptual remediation/compensation;Patient/family education;Therapeutic activities;Therapeutic exercise;Functional Mobility Training;DME and/or AE instruction;Neuromuscular education;Manual Therapy    Plan OT discharge    Consulted and Agree with Plan of Care Patient;Family member/caregiver    Family Member Consulted significant other, [name]             Patient will benefit from skilled therapeutic intervention in order to improve the following deficits and impairments:   Body Structure / Function / Physical Skills: ADL, Coordination, Vision, IADL, UE functional use, Proprioception, Strength, Balance, Dexterity, Mobility, FMC Cognitive Skills: Attention, Sequencing, Perception, Understand, Memory, Problem Solve, Safety Awareness (difficult to assess formally due to aphasia and apraxia)     Visit Diagnosis: Other lack of coordination  Apraxia  Other abnormalities of gait and mobility  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)  Unsteadiness on feet  Visuospatial deficit  Attention and concentration deficit  Muscle weakness  (generalized)    Problem List Patient Active Problem List   Diagnosis Date Noted   AKI (acute kidney injury) ([place]) 07/07/2020   Middle cerebral artery embolism, 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   Acute ischemic stroke ([place]) 06/05/2020   Acute left-sided weakness 06/04/2020   Nicotine dependence 06/04/2020   Cocaine use 06/04/2020   Aphasia    Polysubstance abuse ([place])    Type 2 diabetes mellitus with hyperglycemia, with long-term current use of insulin ([place])    Hypokalemia    Acute CVA (cerebrovascular accident) ([place]) 04/20/2020   Essential hypertension 08/21/2019   Obesity 08/21/2019    Zachery Conch, OT/L 10/04/2020, 4:31 PM  [facility] [address] [place], [place], [address] Phone: [phone]   Fax:  [phone]  Name: Nathaniel Spears MRN: [id number] Date of Birth: June 23, 1975

## 2020-10-04 NOTE — Therapy (Signed)
[facility] [address] [place], [place], [address] Phone: [phone]   Fax:  [phone]  Speech Language Pathology Treatment/Discharge Summary  Patient Details  Name: Nathaniel Spears MRN: [id number] Date of Birth: 10-Mar-1975 Referring Provider (SLP): Cathlyn Parsons, PA-C   Encounter Date: 10/04/2020   End of Session - 10/04/20 1533     Visit Number 14    Number of Visits 25    Date for SLP Re-Evaluation 10/14/20    Authorization Type self-pay (now [facility] Medicaid)    SLP Start Time 1531    SLP Stop Time  1615    SLP Time Calculation (min) 44 min    Activity Tolerance Patient tolerated treatment well             Past Medical History:  Diagnosis Date   Hypertension    Stroke [facility])     Past Surgical History:  Procedure Laterality Date   BUBBLE STUDY  06/01/2020   Procedure: BUBBLE STUDY;  Surgeon: Donato Heinz, MD;  Location: [facility];  Service: Cardiovascular;;   IR ANGIO INTRA EXTRACRAN SEL COM CAROTID INNOMINATE BILAT MOD SED  06/02/2020   IR ANGIO VERTEBRAL SEL SUBCLAVIAN INNOMINATE BILAT MOD SED  06/02/2020   TEE WITHOUT CARDIOVERSION N/A 06/01/2020   Procedure: TRANSESOPHAGEAL ECHOCARDIOGRAM (TEE);  Surgeon: Donato Heinz, MD;  Location: [facility] ENDOSCOPY;  Service: Cardiovascular;  Laterality: N/A;    There were no vitals filed for this visit.   Subjective Assessment - 10/04/20 1550     Subjective "okay"    Currently in Pain? No/denies              SPEECH THERAPY DISCHARGE SUMMARY  Visits from Start of Care: 14  Current functional level related to goals / functional outcomes: Hanad presents with improvements in verbal expression. Pt demonstrates improvements in anomia and apraxia of speech given ST intervention. Pt benefits from intermittent cues to slow rate of speech and questioning cues to aid anomia. Pt is agreeable to ST discharge on last scheduled ST visit. Pt  denied further questions    Remaining deficits: Aphasia, apraxia, possible cognitive deficits   Education / Equipment: Aphasia/apraxia HEP, slow rate, over-articulation, anomia compensations   Patient agrees to discharge. Patient goals were met. Patient is being discharged due to meeting the stated rehab goals..  ,     ADULT SLP TREATMENT - 10/04/20 1533       General Information   Behavior/Cognition Alert;Cooperative;Pleasant mood;Requires cueing      Treatment Provided   Treatment provided Cognitive-Linquistic      Cognitive-Linquistic Treatment   Treatment focused on Apraxia;Aphasia    Skilled Treatment Pt arrived alone to ST for last scheduled visit. Pt was agreeable to ST discharge due to significant progress and visit number limit. SLP targeted providing verbal descriptions for targeted words, in which SLP provided occasional min fading to rare questioning cues to expand respones. SLP educated and cued slow rate and simplification to aid anomia.     Assessment / Recommendations / Plan   Plan Discharge SLP treatment due to (comment)   good progress     Progression Toward Goals   Progression toward goals Goals met, education completed, patient discharged from [facility] - 10/04/20 1533     Education Details discharge sumary, continue HEP    Person(s) Educated Patient    Methods Explanation;Demonstration;Handout    Comprehension Verbalized understanding;Returned  demonstration              SLP Short Term Goals - 08/30/20 0956       SLP SHORT TERM GOAL #1   Title Pt will name 5-10 words in personally relevant categories with occasional mod A over 2 sessions    Baseline 08-13-20, 08-30-20    Status Achieved      SLP SHORT TERM GOAL #2   Title Pt will read sentences with ability to correct errors with 80% accuracy given occasional mod A over 2 sessions    Baseline 08-12-20, 08-20-20    Status Achieved      SLP SHORT TERM GOAL #3   Title  Pt will use external visual communication aids to augment verbal expression for communication of wants/needs for 4/5 opportunities with occasional min A over 2 sessions    Status Deferred      SLP SHORT TERM GOAL #4   Title Pt will complete picture description tasks by verbalizing at least 2 words for 8/10 opportunities with occasional mod A over 2 sessions    Baseline 08-27-20, 08-30-20    Status Achieved      SLP SHORT TERM GOAL #5   Title Pt will verbalize 2-3 word phrases in response to simple conversational dialogue with usual mod A over 2 sessions    Baseline 08-27-20, 08-30-20    Status Achieved              SLP Long Term Goals - 10/04/20 1535       SLP LONG TERM GOAL #1   Title Pt will verbalize 3-4 word sentences in response to simple open ended questions with usual min A over 2 sessions    Baseline 09-01-20, 09-29-20    Status Achieved      SLP LONG TERM GOAL #2   Title Pt will use mulitmodal communication system to augment verbal expression to meet needs at home with occasional min A from family over 2 sessions    Baseline 10-04-20    Status Achieved      SLP LONG TERM GOAL #3   Title Pt/caregiver will report improvements in frustration level and communication effectivness via QOL scale by 2 points at last ST session    Baseline CPB: 0; 10 at d/c    Time --    Period --    Status Achieved              Plan - 10/04/20 [place] presented for OPST intervention to address apraxia and aphasia s/p multiple strokes in past 6 months. Pt has exhibited improvements in verbal expression, especially at conversational level. SLP recommended use of pictures and visual aids to verbal expression at home. Pt is agreeable to discharge on last scheduled ST visit. SLP provided recommendations to continue s/p ST discharge. Pt may benefit from more ST intervention in the future.    Treatment/Interventions Compensatory strategies;Cueing  hierarchy;Functional tasks;Patient/family education;Cognitive reorganization;Multimodal communcation approach;Environmental controls;Compensatory techniques;Internal/external aids;SLP instruction and feedback;Language facilitation    Potential to Achieve Goals Fair    Potential Considerations Severity of impairments;Previous level of function    SLP Home Exercise Plan provided    Consulted and Agree with Plan of Care Patient             Patient will benefit from skilled therapeutic intervention in order to improve the following deficits and impairments:   Aphasia  Verbal apraxia    Problem List Patient Active Problem  List   Diagnosis Date Noted   AKI (acute kidney injury) ([place]) 07/07/2020   Middle cerebral artery embolism, 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   Acute ischemic stroke ([place]) 06/05/2020   Acute left-sided weakness 06/04/2020   Nicotine dependence 06/04/2020   Cocaine use 06/04/2020   Aphasia    Polysubstance abuse ([place])    Type 2 diabetes mellitus with hyperglycemia, with long-term current use of insulin ([place])    Hypokalemia    Acute CVA (cerebrovascular accident) ([facility]) 04/20/2020   Essential hypertension 08/21/2019   Obesity 08/21/2019    Alinda Deem, MA CCC-SLP 10/04/2020, 4:23 PM  [facility] [address] [place], [place], [address] Phone: [phone]   Fax:  [phone]   Name: Nathaniel Spears MRN: [id number] Date of Birth: 29-Nov-1975

## 2020-10-04 NOTE — Patient Instructions (Addendum)
Nathaniel Spears,  We are discharging occupational therapy today d/t Nathaniel Spears meeting all of his therapy goals. He has made some improvement with overall planning and definitely with coordination and strength with bilateral hands and arms. He still demonstrates significant right inattention (missing 3/12 cards all on the right) with environmental scanning and tabletop scanning. Here are some ideas for you all to work on at home to continue to work on increasing attention and scanning in his environment.  GAMES Spot It Runner, broadcasting/film/video  ACTIVITIES/IDEAS Sorting playing cards LARGE PRINT word searches Mazes  If you have any questions or concerns, please reach out to me. I recommend seeing how things go and encouraging more use of the Right side to increase attention and working on functional tasks (laundry, dishwashing, making the bed, sweeping, simple sequencing tasks in the kitchen, etc) for encouraging that motor planning. Feel free to reach out to your PCP or follow up neurologist for another referral in 6 months or so for another referral and tune up down the road. It has been such a pleasure working with [employer] and meeting you guys.  Kallie Edward, MOT, OTR/L Occupational [employer]  [phone]

## 2020-10-07 ENCOUNTER — Other Ambulatory Visit: Payer: Self-pay

## 2020-11-11 ENCOUNTER — Other Ambulatory Visit: Payer: Self-pay

## 2020-11-19 ENCOUNTER — Other Ambulatory Visit (INDEPENDENT_AMBULATORY_CARE_PROVIDER_SITE_OTHER): Payer: Self-pay | Admitting: Primary Care

## 2020-11-19 ENCOUNTER — Other Ambulatory Visit: Payer: Self-pay

## 2020-11-19 NOTE — Telephone Encounter (Signed)
Routed to PCP 

## 2020-11-20 ENCOUNTER — Other Ambulatory Visit (INDEPENDENT_AMBULATORY_CARE_PROVIDER_SITE_OTHER): Payer: Self-pay | Admitting: Primary Care

## 2020-11-20 MED ORDER — FLUOXETINE HCL 20 MG PO CAPS
20.0000 mg | ORAL_CAPSULE | Freq: Every day | ORAL | 0 refills | Status: DC
  Filled 2020-11-20: qty 90, 90d supply, fill #0

## 2020-11-22 ENCOUNTER — Other Ambulatory Visit: Payer: Self-pay

## 2020-11-25 ENCOUNTER — Other Ambulatory Visit: Payer: Self-pay

## 2020-12-14 ENCOUNTER — Encounter: Payer: Self-pay | Admitting: Physical Medicine and Rehabilitation

## 2020-12-14 ENCOUNTER — Other Ambulatory Visit: Payer: Self-pay

## 2020-12-14 ENCOUNTER — Encounter
Payer: Medicaid Other | Attending: Physical Medicine and Rehabilitation | Admitting: Physical Medicine and Rehabilitation

## 2020-12-14 VITALS — BP 146/99 | HR 84 | Temp 98.8°F | Wt 266.0 lb

## 2020-12-14 DIAGNOSIS — I10 Essential (primary) hypertension: Secondary | ICD-10-CM | POA: Diagnosis not present

## 2020-12-14 DIAGNOSIS — R4701 Aphasia: Secondary | ICD-10-CM | POA: Insufficient documentation

## 2020-12-14 MED ORDER — AMLODIPINE BESYLATE 10 MG PO TABS
10.0000 mg | ORAL_TABLET | Freq: Every day | ORAL | 1 refills | Status: DC
  Filled 2020-12-14 – 2021-05-19 (×3): qty 90, 90d supply, fill #0

## 2020-12-14 MED ORDER — METOPROLOL TARTRATE 25 MG PO TABS
25.0000 mg | ORAL_TABLET | Freq: Every day | ORAL | 3 refills | Status: DC
  Filled 2020-12-14 – 2021-01-28 (×3): qty 30, 30d supply, fill #0
  Filled 2021-03-22: qty 30, 30d supply, fill #1
  Filled 2021-05-10 – 2021-05-19 (×2): qty 30, 30d supply, fill #2

## 2020-12-14 MED ORDER — FLUOXETINE HCL 10 MG PO CAPS
10.0000 mg | ORAL_CAPSULE | Freq: Every day | ORAL | 3 refills | Status: DC
  Filled 2020-12-14 – 2020-12-24 (×2): qty 30, 30d supply, fill #0

## 2020-12-14 MED ORDER — CLOPIDOGREL BISULFATE 75 MG PO TABS
75.0000 mg | ORAL_TABLET | Freq: Every day | ORAL | 3 refills | Status: DC
  Filled 2020-12-14 – 2021-01-28 (×3): qty 30, 30d supply, fill #0
  Filled 2021-03-22: qty 30, 30d supply, fill #1
  Filled 2021-05-10 – 2021-05-19 (×2): qty 30, 30d supply, fill #2

## 2020-12-14 NOTE — Progress Notes (Addendum)
Subjective:    Patient ID: Nathaniel Spears, male    DOB: 07/02/1975, 45 y.o.   MRN: [id number]  HPI [name] is 45 year old man who presents for follow-up of CVA  -they are working on using both hands in therapy, he prefers his left -working on balance -getting ready to discharge from PT and OT -speech will continued -no pain -walking has been fine. -no falls since home -walks up stairs himself -still having a hard time with speech. -he can answer yes and no easily -says I'm fine -writing is still poor -he understands everything -his aunt bought him a book that helps with the brain after stroke.  -she sent him some puzzles with bigger pieces.  -needs refills off all medications today.  -they don't eat a lot of bed and pasta.   Pain Inventory Average Pain 0 Pain Right Now 0 My pain is  no pain  LOCATION OF PAIN  no pain  BOWEL Number of stools per week: 7 Oral laxative use No  Type of laxative n/a Enema or suppository use No  History of colostomy No  Incontinent No   BLADDER Normal  Able to self cath No  Bladder incontinence No  Frequent urination No  Leakage with coughing No  Difficulty starting stream No  Incomplete bladder emptying No    Mobility walk without assistance how many minutes can you walk? ability to climb steps?  yes do you drive?  no  Function not employed: date last employed n/a  Neuro/Psych confusion  Prior Studies N/a  Physicians involved in your care N/a   Family History  Problem Relation Age of Onset   Stroke Neg Hx    Social History   Socioeconomic History   Marital status: Single    Spouse name: Not on file   Number of children: Not on file   Years of education: Not on file   Highest education level: Not on file  Occupational History   Not on file  Tobacco Use   Smoking status: Former   Smokeless tobacco: Never  Vaping Use   Vaping Use: Never used  Substance and Sexual Activity   Alcohol use: Not  Currently   Drug use: Never   Sexual activity: Not Currently  Other Topics Concern   Not on file  Social History Narrative   Not on file   Social Determinants of Health   Financial Resource Strain: Not on file  Food Insecurity: Not on file  Transportation Needs: Not on file  Physical Activity: Not on file  Stress: Not on file  Social Connections: Not on file   Past Surgical History:  Procedure Laterality Date   BUBBLE STUDY  06/01/2020   Procedure: BUBBLE STUDY;  Surgeon: Little Ishikawa, MD;  Location[facility] ENDOSCOPY;  Service: Cardiovascular;;   IR ANGIO INTRA EXTRACRAN SEL COM CAROTID INNOMINATE BILAT MOD SED  06/02/2020   IR ANGIO VERTEBRAL SEL SUBCLAVIAN INNOMINATE BILAT MOD SED  06/02/2020   TEE WITHOUT CARDIOVERSION N/A 06/01/2020   Procedure: TRANSESOPHAGEAL ECHOCARDIOGRAM (TEE);  Surgeon: Little Ishikawa, MD;  Location: [facility] ENDOSCOPY;  Service: Cardiovascular;  Laterality: N/A;   Past Medical History:  Diagnosis Date   Hypertension    Stroke (HCC)    BP (!) 163/111   Pulse 84   Temp 98.8 F (37.1 C) (Oral)   Wt 266 lb (120.7 kg)   SpO2 92%   BMI 35.09 kg/m   Opioid Risk Score:   Fall Risk Score:  `  1  Depression screen PHQ 2/9  Depression screen [facility] 2/9 09/03/2020 05/18/2020 02/13/2020 08/21/2019  Decreased Interest 0 0 0 1  Down, Depressed, Hopeless 0 3 0 2  PHQ - 2 Score 0 3 0 3  Altered sleeping 0 3 - 3  Tired, decreased energy 0 3 - 3  Change in appetite 0 3 - 3  Feeling bad or failure about yourself  0 3 - 3  Trouble concentrating 0 3 - 3  Moving slowly or fidgety/restless 0 3 - 0  Suicidal thoughts 0 1 - 0  PHQ-9 Score 0 22 - 18  Difficult doing work/chores - Very difficult - Somewhat difficult       Review of Systems  Constitutional: Negative.   HENT: Negative.    Eyes: Negative.   Respiratory: Negative.    Cardiovascular: Negative.   Gastrointestinal: Negative.   Endocrine: Negative.   Genitourinary: Negative.    Musculoskeletal: Negative.   Skin: Negative.   Allergic/Immunologic: Negative.   Neurological: Negative.   Hematological: Negative.   Psychiatric/Behavioral:  Positive for confusion.       Objective:   Physical Exam Gen: no distress, normal appearing HEENT: oral mucosa pink and moist, NCAT Cardio: Reg rate Chest: normal effort, normal rate of breathing Abd: soft, non-distended Ext: no edema Psych: pleasant, normal affect Skin: intact Neuro: Alert and oriented x3. Expressive aphasia. Says yes and no easily but limited in speech beyond this.  Musculoskeletal: 5/5 strength throughout     Assessment & Plan:   1) CVA: -doing really well! -continue SLP -has PCP -refilled plavix  2) AKI: -continue 6-8 glasses of water per day.   3) DM type 2: -has plan for hgbA1c   4) HTN: HTN: -BP is 146/99 today.  -Advised getting BP machine at home -Advised checking BP daily at home and logging results to bring into follow-up appointment with her PCP and myself. -Reviewed BP meds today.  -Advised regarding healthy foods that can help lower blood pressure and provided with a list: 1) citrus foods- high in vitamins and minerals 2) salmon and other fatty fish - reduces inflammation and oxylipins 3) swiss chard (leafy green)- high level of nitrates 4) pumpkin seeds- one of the best natural sources of magnesium 5) Beans and lentils- high in fiber, magnesium, and potassium 6) Berries- high in flavonoids 7) Amaranth (whole grain, can be cooked similarly to rice and oats)- high in magnesium and fiber 8) Pistachios- even more effective at reducing BP than other nuts 9) Carrots- high in phenolic compounds that relax blood vessels and reduce inflammation 10) Celery- contain phthalides that relax tissues of arterial walls 11) Tomatoes- can also improve cholesterol and reduce risk of heart disease 12) Broccoli- good source of magnesium, calcium, and potassium 13) Greek yogurt: high in  potassium and calcium 14) Herbs and spices: Celery seed, cilantro, saffron, lemongrass, black cumin, ginseng, cinnamon, cardamom, sweet basil, and ginger 15) Chia and flax seeds- also help to lower cholesterol and blood sugar 16) Beets- high levels of nitrates that relax blood vessels  17) spinach and bananas- high in potassium  -Provided lise of supplements that can help with hypertension:  1) magnesium: one high quality brand is Bioptemizers since it contains all 7 types of magnesium, otherwise over the counter magnesium gluconate 400mg  is a good option 2) B vitamins 3) vitamin D 4) potassium 5) CoQ10 6) L-arginine 7) Vitamin C 8) Beetroot -Educated that goal BP is 120/80. -Made goal to incorporate some of the above  foods into diet.

## 2020-12-14 NOTE — Patient Instructions (Signed)
HTN: -BP is 146/99 today.  -Advised checking BP daily at home and logging results to bring into follow-up appointment with her PCP and myself. -Reviewed BP meds today.  -Advised regarding healthy foods that can help lower blood pressure and provided with a list: 1) citrus foods- high in vitamins and minerals 2) salmon and other fatty fish - reduces inflammation and oxylipins 3) swiss chard (leafy green)- high level of nitrates 4) pumpkin seeds- one of the best natural sources of magnesium 5) Beans and lentils- high in fiber, magnesium, and potassium 6) Berries- high in flavonoids 7) Amaranth (whole grain, can be cooked similarly to rice and oats)- high in magnesium and fiber 8) Pistachios- even more effective at reducing BP than other nuts 9) Carrots- high in phenolic compounds that relax blood vessels and reduce inflammation 10) Celery- contain phthalides that relax tissues of arterial walls 11) Tomatoes- can also improve cholesterol and reduce risk of heart disease 12) Broccoli- good source of magnesium, calcium, and potassium 13) Greek yogurt: high in potassium and calcium 14) Herbs and spices: Celery seed, cilantro, saffron, lemongrass, black cumin, ginseng, cinnamon, cardamom, sweet basil, and ginger 15) Chia and flax seeds- also help to lower cholesterol and blood sugar 16) Beets- high levels of nitrates that relax blood vessels  17) spinach and bananas- high in potassium  -Provided lise of supplements that can help with hypertension:  1) magnesium: one high quality brand is Bioptemizers since it contains all 7 types of magnesium, otherwise over the counter magnesium gluconate 400mg  is a good option 2) B vitamins 3) vitamin D 4) potassium 5) CoQ10 6) L-arginine 7) Vitamin C 8) Beetroot -Educated that goal BP is 120/80. -Made goal to incorporate some of the above foods into diet.

## 2020-12-21 ENCOUNTER — Other Ambulatory Visit: Payer: Self-pay

## 2020-12-24 ENCOUNTER — Emergency Department (HOSPITAL_COMMUNITY)
Admission: EM | Admit: 2020-12-24 | Discharge: 2020-12-24 | Disposition: A | Discharge status: Home / Self Care | Payer: Medicaid Other | Attending: Emergency Medicine | Admitting: Emergency Medicine

## 2020-12-24 ENCOUNTER — Other Ambulatory Visit: Payer: Self-pay

## 2020-12-24 ENCOUNTER — Emergency Department (HOSPITAL_COMMUNITY): Payer: Medicaid Other

## 2020-12-24 ENCOUNTER — Encounter (HOSPITAL_COMMUNITY): Payer: Self-pay

## 2020-12-24 DIAGNOSIS — E119 Type 2 diabetes mellitus without complications: Secondary | ICD-10-CM | POA: Diagnosis not present

## 2020-12-24 DIAGNOSIS — Y92410 Unspecified street and highway as the place of occurrence of the external cause: Secondary | ICD-10-CM | POA: Diagnosis not present

## 2020-12-24 DIAGNOSIS — Z79899 Other long term (current) drug therapy: Secondary | ICD-10-CM | POA: Insufficient documentation

## 2020-12-24 DIAGNOSIS — I10 Essential (primary) hypertension: Secondary | ICD-10-CM | POA: Insufficient documentation

## 2020-12-24 DIAGNOSIS — S199XXA Unspecified injury of neck, initial encounter: Secondary | ICD-10-CM | POA: Diagnosis present

## 2020-12-24 DIAGNOSIS — M542 Cervicalgia: Secondary | ICD-10-CM | POA: Diagnosis not present

## 2020-12-24 DIAGNOSIS — Z87891 Personal history of nicotine dependence: Secondary | ICD-10-CM | POA: Diagnosis not present

## 2020-12-24 DIAGNOSIS — Z794 Long term (current) use of insulin: Secondary | ICD-10-CM | POA: Diagnosis not present
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
## 2020-12-24 MED ORDER — LIDOCAINE 5 % EX PTCH
1.0000 | MEDICATED_PATCH | CUTANEOUS | 0 refills | Status: DC
  Filled 2020-12-24: qty 30, 30d supply, fill #0

## 2020-12-24 MED ORDER — METHOCARBAMOL 500 MG PO TABS
500.0000 mg | ORAL_TABLET | Freq: Two times a day (BID) | ORAL | 0 refills | Status: DC
  Filled 2020-12-24: qty 20, 10d supply, fill #0
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
## 2020-12-24 NOTE — Discharge Instructions (Addendum)
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
## 2020-12-31 ENCOUNTER — Other Ambulatory Visit: Payer: Self-pay

## 2021-01-28 ENCOUNTER — Other Ambulatory Visit: Payer: Self-pay

## 2021-03-22 ENCOUNTER — Other Ambulatory Visit: Payer: Self-pay

## 2021-03-23 ENCOUNTER — Other Ambulatory Visit: Payer: Self-pay

## 2021-05-10 ENCOUNTER — Other Ambulatory Visit (INDEPENDENT_AMBULATORY_CARE_PROVIDER_SITE_OTHER): Payer: Self-pay | Admitting: Primary Care

## 2021-05-10 ENCOUNTER — Other Ambulatory Visit: Payer: Self-pay

## 2021-05-10 NOTE — Telephone Encounter (Signed)
Sent to PCP ?

## 2021-05-12 ENCOUNTER — Other Ambulatory Visit: Payer: Self-pay

## 2021-05-12 MED ORDER — FLUOXETINE HCL 20 MG PO CAPS
20.0000 mg | ORAL_CAPSULE | Freq: Every day | ORAL | 0 refills | Status: DC
  Filled 2021-05-12 – 2021-05-19 (×2): qty 90, 90d supply, fill #0

## 2021-05-17 ENCOUNTER — Other Ambulatory Visit: Payer: Self-pay

## 2021-05-19 ENCOUNTER — Other Ambulatory Visit: Payer: Self-pay

## 2021-06-14 ENCOUNTER — Encounter
Payer: Medicaid Other | Attending: Physical Medicine and Rehabilitation | Admitting: Physical Medicine and Rehabilitation

## 2021-06-29 ENCOUNTER — Other Ambulatory Visit: Payer: Self-pay

## 2021-06-29 ENCOUNTER — Other Ambulatory Visit: Payer: Self-pay | Admitting: Physical Medicine and Rehabilitation

## 2021-06-29 MED ORDER — CLOPIDOGREL BISULFATE 75 MG PO TABS
75.0000 mg | ORAL_TABLET | Freq: Every day | ORAL | 3 refills | Status: DC
  Filled 2021-06-29: qty 30, 30d supply, fill #0

## 2021-06-29 MED ORDER — METOPROLOL TARTRATE 25 MG PO TABS
25.0000 mg | ORAL_TABLET | Freq: Every day | ORAL | 3 refills | Status: DC
  Filled 2021-06-29: qty 30, 30d supply, fill #0

## 2021-07-06 ENCOUNTER — Other Ambulatory Visit: Payer: Self-pay

## 2021-08-10 ENCOUNTER — Other Ambulatory Visit: Payer: Self-pay | Admitting: Pharmacist

## 2021-08-10 ENCOUNTER — Other Ambulatory Visit: Payer: Self-pay

## 2021-08-10 ENCOUNTER — Encounter (INDEPENDENT_AMBULATORY_CARE_PROVIDER_SITE_OTHER): Payer: Self-pay | Admitting: Primary Care

## 2021-08-10 ENCOUNTER — Ambulatory Visit (INDEPENDENT_AMBULATORY_CARE_PROVIDER_SITE_OTHER): Payer: Medicaid Other | Admitting: Primary Care

## 2021-08-10 VITALS — BP 177/119 | HR 91 | Temp 98.2°F | Ht 73.0 in | Wt 275.8 lb

## 2021-08-10 DIAGNOSIS — I160 Hypertensive urgency: Secondary | ICD-10-CM

## 2021-08-10 DIAGNOSIS — E1165 Type 2 diabetes mellitus with hyperglycemia: Secondary | ICD-10-CM | POA: Diagnosis not present

## 2021-08-10 DIAGNOSIS — Z794 Long term (current) use of insulin: Secondary | ICD-10-CM

## 2021-08-10 DIAGNOSIS — F32A Depression, unspecified: Secondary | ICD-10-CM

## 2021-08-10 MED ORDER — BLOOD GLUCOSE METER KIT
PACK | 0 refills | Status: DC
  Filled 2021-08-10: qty 1, fill #0

## 2021-08-10 MED ORDER — TRAZODONE HCL 50 MG PO TABS
25.0000 mg | ORAL_TABLET | Freq: Every evening | ORAL | 3 refills | Status: DC | PRN
  Filled 2021-08-10: qty 30, 30d supply, fill #0
  Filled 2021-09-13 – 2021-09-22 (×2): qty 30, 30d supply, fill #1
  Filled 2021-10-23: qty 30, 30d supply, fill #2
  Filled 2021-11-22 – 2021-11-26 (×2): qty 30, 30d supply, fill #3

## 2021-08-10 MED ORDER — LISINOPRIL-HYDROCHLOROTHIAZIDE 20-12.5 MG PO TABS
1.0000 | ORAL_TABLET | Freq: Every day | ORAL | 1 refills | Status: DC
  Filled 2021-08-10: qty 90, 90d supply, fill #0

## 2021-08-10 MED ORDER — ACCU-CHEK GUIDE VI STRP
ORAL_STRIP | 2 refills | Status: DC
  Filled 2021-08-10: qty 100, 33d supply, fill #0
  Filled 2021-10-13: qty 100, 33d supply, fill #1
  Filled 2022-01-30: qty 100, 33d supply, fill #2

## 2021-08-10 MED ORDER — EMPAGLIFLOZIN 25 MG PO TABS
25.0000 mg | ORAL_TABLET | Freq: Every day | ORAL | 1 refills | Status: DC
  Filled 2021-08-10 – 2021-08-12 (×2): qty 90, 90d supply, fill #0
  Filled 2021-10-28: qty 90, 90d supply, fill #1

## 2021-08-10 MED ORDER — ACCU-CHEK SOFTCLIX LANCETS MISC
2 refills | Status: AC
  Filled 2021-08-10: qty 100, 33d supply, fill #0

## 2021-08-10 MED ORDER — CLONIDINE HCL 0.1 MG PO TABS
0.2000 mg | ORAL_TABLET | Freq: Once | ORAL | Status: AC
  Administered 2021-08-10: 0.2 mg via ORAL

## 2021-08-10 MED ORDER — AMLODIPINE BESYLATE 10 MG PO TABS
10.0000 mg | ORAL_TABLET | Freq: Every day | ORAL | 1 refills | Status: DC
  Filled 2021-08-10 – 2021-09-07 (×2): qty 90, 90d supply, fill #0

## 2021-08-10 MED ORDER — LEVEMIR FLEXTOUCH 100 UNIT/ML ~~LOC~~ SOPN
10.0000 [IU] | PEN_INJECTOR | Freq: Every day | SUBCUTANEOUS | 11 refills | Status: DC
  Filled 2021-08-10: qty 9, 90d supply, fill #0
  Filled 2021-11-10: qty 9, 90d supply, fill #1
  Filled 2022-01-30: qty 9, 90d supply, fill #2

## 2021-08-10 MED ORDER — ACCU-CHEK GUIDE W/DEVICE KIT
PACK | 0 refills | Status: AC
  Filled 2021-08-10: qty 1, 30d supply, fill #0

## 2021-08-10 MED ORDER — PENTIPS 32G X 4 MM MISC
1 refills | Status: DC
  Filled 2021-08-10: qty 100, 90d supply, fill #0
  Filled 2021-11-11: qty 100, 90d supply, fill #1

## 2021-08-10 MED ORDER — METOPROLOL SUCCINATE ER 25 MG PO TB24
12.5000 mg | ORAL_TABLET | Freq: Every day | ORAL | 3 refills | Status: DC
  Filled 2021-08-10: qty 45, 90d supply, fill #0

## 2021-08-10 NOTE — Patient Instructions (Signed)
Hypertension, Adult High blood pressure (hypertension) is when the force of blood pumping through the arteries is too strong. The arteries are the blood vessels that carry blood from the heart throughout the body. Hypertension forces the heart to work harder to pump blood and may cause arteries to become narrow or stiff. Untreated or uncontrolled hypertension can lead to a heart attack, heart failure, a stroke, kidney disease, and other problems. A blood pressure reading consists of a higher number over a lower number. Ideally, your blood pressure should be below 120/80. The first ("top") number is called the systolic pressure. It is a measure of the pressure in your arteries as your heart beats. The second ("bottom") number is called the diastolic pressure. It is a measure of the pressure in your arteries as the heart relaxes. What are the causes? The exact cause of this condition is not known. There are some conditions that result in high blood pressure. What increases the risk? Certain factors may make you more likely to develop high blood pressure. Some of these risk factors are under your control, including: Smoking. Not getting enough exercise or physical activity. Being overweight. Having too much fat, sugar, calories, or salt (sodium) in your diet. Drinking too much alcohol. Other risk factors include: Having a personal history of heart disease, diabetes, high cholesterol, or kidney disease. Stress. Having a family history of high blood pressure and high cholesterol. Having obstructive sleep apnea. Age. The risk increases with age. What are the signs or symptoms? High blood pressure may not cause symptoms. Very high blood pressure (hypertensive crisis) may cause: Headache. Fast or irregular heartbeats (palpitations). Shortness of breath. Nosebleed. Nausea and vomiting. Vision changes. Severe chest pain, dizziness, and seizures. How is this diagnosed? This condition is diagnosed by  measuring your blood pressure while you are seated, with your arm resting on a flat surface, your legs uncrossed, and your feet flat on the floor. The cuff of the blood pressure monitor will be placed directly against the skin of your upper arm at the level of your heart. Blood pressure should be measured at least twice using the same arm. Certain conditions can cause a difference in blood pressure between your right and left arms. If you have a high blood pressure reading during one visit or you have normal blood pressure with other risk factors, you may be asked to: Return on a different day to have your blood pressure checked again. Monitor your blood pressure at home for 1 week or longer. If you are diagnosed with hypertension, you may have other blood or imaging tests to help your health care provider understand your overall risk for other conditions. How is this treated? This condition is treated by making healthy lifestyle changes, such as eating healthy foods, exercising more, and reducing your alcohol intake. You may be referred for counseling on a healthy diet and physical activity. Your health care provider may prescribe medicine if lifestyle changes are not enough to get your blood pressure under control and if: Your systolic blood pressure is above 130. Your diastolic blood pressure is above 80. Your personal target blood pressure may vary depending on your medical conditions, your age, and other factors. Follow these instructions at home: Eating and drinking  Eat a diet that is high in fiber and potassium, and low in sodium, added sugar, and fat. An example of this eating plan is called the DASH diet. DASH stands for Dietary Approaches to Stop Hypertension. To eat this way: Eat   plenty of fresh fruits and vegetables. Try to fill one half of your plate at each meal with fruits and vegetables. Eat whole grains, such as whole-wheat pasta, brown rice, or whole-grain bread. Fill about one  fourth of your plate with whole grains. Eat or drink low-fat dairy products, such as skim milk or low-fat yogurt. Avoid fatty cuts of meat, processed or cured meats, and poultry with skin. Fill about one fourth of your plate with lean proteins, such as fish, chicken without skin, beans, eggs, or tofu. Avoid pre-made and processed foods. These tend to be higher in sodium, added sugar, and fat. Reduce your daily sodium intake. Many people with hypertension should eat less than 1,500 mg of sodium a day. Do not drink alcohol if: Your health care provider tells you not to drink. You are pregnant, may be pregnant, or are planning to become pregnant. If you drink alcohol: Limit how much you have to: 0-1 drink a day for women. 0-2 drinks a day for men. Know how much alcohol is in your drink. In the [place] one drink equals one 12 oz bottle of beer (355 mL), one 5 oz glass of wine (148 mL), or one 1 oz glass of hard liquor (44 mL). Lifestyle  Work with your health care provider to maintain a healthy body weight or to lose weight. Ask what an ideal weight is for you. Get at least 30 minutes of exercise that causes your heart to beat faster (aerobic exercise) most days of the week. Activities may include walking, swimming, or biking. Include exercise to strengthen your muscles (resistance exercise), such as Pilates or lifting weights, as part of your weekly exercise routine. Try to do these types of exercises for 30 minutes at least 3 days a week. Do not use any products that contain nicotine or tobacco. These products include cigarettes, chewing tobacco, and vaping devices, such as e-cigarettes. If you need help quitting, ask your health care provider. Monitor your blood pressure at home as told by your health care provider. Keep all follow-up visits. This is important. Medicines Take over-the-counter and prescription medicines only as told by your health care provider. Follow directions carefully. Blood  pressure medicines must be taken as prescribed. Do not skip doses of blood pressure medicine. Doing this puts you at risk for problems and can make the medicine less effective. Ask your health care provider about side effects or reactions to medicines that you should watch for. Contact a health care provider if you: Think you are having a reaction to a medicine you are taking. Have headaches that keep coming back (recurring). Feel dizzy. Have swelling in your ankles. Have trouble with your vision. Get help right away if you: Develop a severe headache or confusion. Have unusual weakness or numbness. Feel faint. Have severe pain in your chest or abdomen. Vomit repeatedly. Have trouble breathing. These symptoms may be an emergency. Get help right away. Call 911. Do not wait to see if the symptoms will go away. Do not drive yourself to the hospital. Summary Hypertension is when the force of blood pumping through your arteries is too strong. If this condition is not controlled, it may put you at risk for serious complications. Your personal target blood pressure may vary depending on your medical conditions, your age, and other factors. For most people, a normal blood pressure is less than 120/80. Hypertension is treated with lifestyle changes, medicines, or a combination of both. Lifestyle changes include losing weight, eating a healthy,   low-sodium diet, exercising more, and limiting alcohol. This information is not intended to replace advice given to you by your health care provider. Make sure you discuss any questions you have with your health care provider. Document Revised: 11/09/2020 Document Reviewed: 11/09/2020 Elsevier Patient Education  2023 [employer]  

## 2021-08-11 ENCOUNTER — Other Ambulatory Visit: Payer: Self-pay

## 2021-08-12 ENCOUNTER — Other Ambulatory Visit: Payer: Self-pay

## 2021-08-15 ENCOUNTER — Other Ambulatory Visit: Payer: Self-pay

## 2021-08-15 ENCOUNTER — Telehealth (INDEPENDENT_AMBULATORY_CARE_PROVIDER_SITE_OTHER): Payer: Self-pay | Admitting: Primary Care

## 2021-08-15 NOTE — Telephone Encounter (Signed)
Pts significant other called and stated that pts insurance wont cover Rx #: [id number]  empagliflozin (JARDIANCE) 25 MG TABS tablet and if another medication can be sent to the pharmacy / please call and advise asap

## 2021-08-15 NOTE — Telephone Encounter (Signed)
Spoke with pharmacy tech. Medication is covered will cost $4. Patient aware

## 2021-08-16 ENCOUNTER — Other Ambulatory Visit: Payer: Self-pay

## 2021-08-22 NOTE — Progress Notes (Addendum)
Nathaniel Spears, is a 46 y.o. male  TZG:[id number]  PRF:[id number]  DOB - 03-27-1975  Chief Complaint  Patient presents with   Diabetes       Subjective:   Nathaniel Spears is a 46 y.o. male here today for a follow up visit for T2D, and hypertension. Patient has No headache, No chest pain, No abdominal pain - No Nausea, No new weakness tingling or numbness, No Cough - shortness of breath  No problems updated.  Allergies  Allergen Reactions   Milk-Related Compounds     Past Medical History:  Diagnosis Date   Hypertension    Stroke [facility])     Current Outpatient Medications on File Prior to Visit  Medication Sig Dispense Refill   acetaminophen (TYLENOL) 325 MG tablet Take 2 tablets (650 mg total) by mouth every 4 (four) hours as needed for mild pain (or temp > 37.5 C (99.5 F)). (Patient not taking: Reported on 08/10/2021) 20 tablet 0   aspirin EC 81 MG tablet Take 1 tablet by mouth daily. (Patient not taking: Reported on 08/10/2021)     clopidogrel (PLAVIX) 75 MG tablet Take 1 tablet by mouth daily. (Patient not taking: Reported on 08/10/2021)     clopidogrel (PLAVIX) 75 MG tablet Take 1 tablet (75 mg total) by mouth once daily. (Patient not taking: Reported on 08/10/2021) 30 tablet 3   erythromycin ophthalmic ointment Place a 1/2 inch ribbon of ointment into the lower eyelid twice daily for 7 days. (Patient not taking: Reported on 08/10/2021) 3.5 g 0   lidocaine (LIDODERM) 5 % Place 1 patch onto the skin daily. Remove & Discard patch within 12 hours or as directed by MD (Patient not taking: Reported on 08/10/2021) 30 patch 0   loperamide (IMODIUM) 2 MG capsule Take by mouth. (Patient not taking: Reported on 08/10/2021)     No current facility-administered medications on file prior to visit.    Objective:   Vitals:   08/10/21 1542 08/10/21 1558  BP: (!) 183/117 (!) 177/119  Pulse: 91 91  Temp: 98.2 F (36.8 C)   TempSrc: Oral   SpO2: 100%   Weight:  275 lb 12.8 oz (125.1 kg)   Height: '6\' 1"'  (1.854 m)     Exam General appearance : Awake, alert, not in any distress. Aphagia . Not toxic looking HEENT: Atraumatic and Normocephalic, pupils equally reactive to light and accomodation Neck: Supple, no JVD. No cervical lymphadenopathy.  Chest: Good air entry bilaterally, no added sounds  CVS: S1 S2 regular, no murmurs.  Abdomen: Bowel sounds present, Non tender and not distended with no gaurding, rigidity or rebound. Extremities: B/L Lower Ext shows no edema, both legs are warm to touch Neurology: Awake alert, and oriented X 3,  Non focal Skin: No Rash  Data Review Lab Results  Component Value Date   HGBA1C 10.3 (H) 05/31/2020   HGBA1C 11.1 (H) 04/20/2020    Assessment & Plan  [name] was seen today for diabetes.  Diagnoses and all orders for this visit:  Type 2 diabetes mellitus with hyperglycemia, with long-term current use of insulin (HCC) -     HgB A1c Discussed  co- morbidities with uncontrol diabetes  Complications -diabetic retinopathy, (close your eyes ? What do you see nothing) nephropathy decrease in kidney function- can lead to dialysis-on a machine 3 days a week to filter your kidney, neuropathy- numbness and tinging in your hands and feet,  increase risk of heart attack and stroke, and amputation  due to decrease wound healing and circulation. Decrease your risk by taking medication daily as prescribed, monitor carbohydrates- foods that are high in carbohydrates are the following rice, potatoes, breads, sugars, and pastas.  Reduction in the intake (eating) will assist in lowering your blood sugars. Exercise daily at least 30 minutes daily.   Depression, unspecified depression type -     traZODone (DESYREL) 50 MG tablet; Take 0.5-1 tablets (25-50 mg total) by mouth at bedtime as needed for sleep.  Hypertensive urgency -     cloNIDine (CATAPRES) tablet 0.2 mg  Other orders -     empagliflozin (JARDIANCE) 25 MG TABS tablet;  Take 1 tablet (25 mg total) by mouth daily before breakfast. -     metoprolol succinate (TOPROL-XL) 25 MG 24 hr tablet; Take 0.5 tablets (12.5 mg total) by mouth daily. -     lisinopril-hydrochlorothiazide (ZESTORETIC) 20-12.5 MG tablet; Take 1 tablet by mouth daily. -     Insulin Pen Needle (PENTIPS) 32G X 4 MM MISC; Use as directed -     insulin detemir (LEVEMIR FLEXTOUCH) 100 UNIT/ML FlexPen; Inject 10 Units into the skin at bedtime. -     amLODipine (NORVASC) 10 MG tablet; Take 1 tablet (10 mg total) by mouth daily. -     blood glucose meter kit and supplies; Dispense based on patient and insurance preference. Use up to four times daily as directed. (FOR ICD-10 E10.9, E11.9).   Patient have been counseled extensively about nutrition and exercise. Other issues discussed during this visit include: low cholesterol diet, weight control and daily exercise, foot care, annual eye examinations at Ophthalmology, importance of adherence with medications and regular follow-up. We also discussed long term complications of uncontrolled diabetes and hypertension.   Return in about 4 weeks (around 09/07/2021) for BP ck.  The patient was given clear instructions to go to ER or return to medical center if symptoms don't improve, worsen or new problems develop. The patient verbalized understanding. The patient was told to call to get lab results if they haven't heard anything in the next week.   This note has been created with Surveyor, quantity. Any transcriptional errors are unintentional.   Kerin Perna, NP 08/22/2021, 7:57 PM

## 2021-08-25 ENCOUNTER — Encounter (HOSPITAL_COMMUNITY): Payer: Self-pay

## 2021-08-25 ENCOUNTER — Other Ambulatory Visit: Payer: Self-pay

## 2021-08-25 ENCOUNTER — Emergency Department (HOSPITAL_COMMUNITY): Payer: Medicaid Other

## 2021-08-25 ENCOUNTER — Emergency Department (HOSPITAL_COMMUNITY)
Admission: EM | Admit: 2021-08-25 | Discharge: 2021-08-25 | Discharge status: Against Medical Advice | Payer: Medicaid Other | Attending: Emergency Medicine | Admitting: Emergency Medicine

## 2021-08-25 ENCOUNTER — Encounter (HOSPITAL_COMMUNITY): Payer: Self-pay | Admitting: Emergency Medicine

## 2021-08-25 ENCOUNTER — Emergency Department (HOSPITAL_COMMUNITY)
Admission: EM | Admit: 2021-08-25 | Discharge: 2021-08-25 | Disposition: A | Discharge status: Home / Self Care | Payer: Medicaid Other | Source: Home / Self Care | Attending: Emergency Medicine | Admitting: Emergency Medicine

## 2021-08-25 DIAGNOSIS — R519 Headache, unspecified: Secondary | ICD-10-CM | POA: Insufficient documentation

## 2021-08-25 DIAGNOSIS — Z7982 Long term (current) use of aspirin: Secondary | ICD-10-CM | POA: Insufficient documentation

## 2021-08-25 DIAGNOSIS — Z5321 Procedure and treatment not carried out due to patient leaving prior to being seen by health care provider: Secondary | ICD-10-CM | POA: Diagnosis not present

## 2021-08-25 DIAGNOSIS — Z794 Long term (current) use of insulin: Secondary | ICD-10-CM | POA: Insufficient documentation

## 2021-08-25 DIAGNOSIS — I10 Essential (primary) hypertension: Secondary | ICD-10-CM | POA: Insufficient documentation

## 2021-08-25 DIAGNOSIS — Z79899 Other long term (current) drug therapy: Secondary | ICD-10-CM | POA: Insufficient documentation

## 2021-08-25 LAB — BASIC METABOLIC PANEL
Anion gap: 9 (ref 5–15)
BUN: 13 mg/dL (ref 6–20)
CO2: 23 mmol/L (ref 22–32)
Calcium: 9 mg/dL (ref 8.9–10.3)
Chloride: 106 mmol/L (ref 98–111)
Creatinine, Ser: 1.16 mg/dL (ref 0.61–1.24)
GFR, Estimated: >60 mL/min (ref >60)
Glucose, Bld: 165 mg/dL — ABNORMAL HIGH (ref 70–99)
Potassium: 3.5 mmol/L (ref 3.5–5.1)
Sodium: 138 mmol/L (ref 135–145)

## 2021-08-25 LAB — CBC
HCT: 46 % (ref 39.0–52.0)
Hemoglobin: 14.4 g/dL (ref 13.0–17.0)
MCH: 25.6 pg — ABNORMAL LOW (ref 26.0–34.0)
MCHC: 31.3 g/dL (ref 30.0–36.0)
MCV: 81.9 fL (ref 80.0–100.0)
Platelets: 257 10*3/uL (ref 150–400)
RBC: 5.62 MIL/uL (ref 4.22–5.81)
RDW: 13.7 % (ref 11.5–15.5)
WBC: 6.9 10*3/uL (ref 4.0–10.5)
nRBC: 0 % (ref 0.0–0.2)

## 2021-08-25 LAB — POCT GLYCOSYLATED HEMOGLOBIN (HGB A1C): Hemoglobin A1C: 13.9 % — AB (ref 4.0–5.6)

## 2021-08-25 MED ORDER — KETOROLAC TROMETHAMINE 30 MG/ML IJ SOLN
15.0000 mg | Freq: Once | INTRAMUSCULAR | Status: AC
  Administered 2021-08-25: 15 mg via INTRAMUSCULAR
  Filled 2021-08-25: qty 1

## 2021-08-25 MED ORDER — DIPHENHYDRAMINE HCL 50 MG/ML IJ SOLN
50.0000 mg | Freq: Once | INTRAMUSCULAR | Status: AC
  Administered 2021-08-25: 50 mg via INTRAMUSCULAR
  Filled 2021-08-25: qty 1

## 2021-08-25 MED ORDER — PROCHLORPERAZINE EDISYLATE 10 MG/2ML IJ SOLN
10.0000 mg | Freq: Once | INTRAMUSCULAR | Status: AC
  Administered 2021-08-25: 10 mg via INTRAMUSCULAR
  Filled 2021-08-25: qty 2

## 2021-08-25 NOTE — ED Notes (Signed)
Patient left prior to getting discharge instructions. Unable to locate patient or family.

## 2021-08-25 NOTE — ED Provider Triage Note (Signed)
Emergency Medicine Provider Triage Evaluation Note  Nathaniel Spears , a 46 y.o. male  was evaluated in triage.  Pt complains of headache. Pt here with headache x 2 days.  Report hx of multiple strokes in the past therefore headaches concerns him.  No fever, chills, n/v/d, dysuria, cp, sob, neck pain, stiffness.  Was initially seen at [facility] today but left due to long wait  Review of Systems  Positive: As above Negative: As above  Physical Exam  BP (!) 158/105 (BP Location: Left Arm)   Pulse 79   Temp 98.7 F (37.1 C) (Oral)   Resp 14   Ht 6\' 1"  (1.854 m)   Wt 125.1 kg   SpO2 98%   BMI 36.39 kg/m  Gen:   Awake, no distress   Resp:  Normal effort  MSK:   Moves extremities without difficulty  Other:    Medical Decision Making  Medically screening exam initiated at 2:10 PM.  Appropriate orders placed.  Armond Montini was informed that the remainder of the evaluation will be completed by another provider, this initial triage assessment does not replace that evaluation, and the importance of remaining in the ED until their evaluation is complete.     Tiburcio Pea, PA-C 08/25/21 1416

## 2021-08-25 NOTE — ED Provider Notes (Signed)
[place] DEPT Provider Note   CSN: [id number] Arrival date & time: 08/25/21  1354     History  Chief Complaint  Patient presents with   Hypertension   Headache    Roger Kettles is a 46 y.o. male.  HPI Patient presents for evaluation of headache.  Headache started 4 days ago.  He presented at [facility] ED earlier today but left before evaluation.  He had labs done there which returned showing mild elevation of glucose, and elevated hemoglobin A 1C.  Patient with history of hypertension, diabetes and polysubstance abuse.  Has a history of stroke.    Home Medications Prior to Admission medications   Medication Sig Start Date End Date Taking? Authorizing Provider  Accu-Chek Softclix Lancets lancets Use to check blood sugar three times daily. 08/10/21   Charlott Rakes, MD  acetaminophen (TYLENOL) 325 MG tablet Take 2 tablets (650 mg total) by mouth every 4 (four) hours as needed for mild pain (or temp > 37.5 C (99.5 F)). Patient not taking: Reported on 08/10/2021 06/03/20   Raiford Noble Latif, DO  amLODipine (NORVASC) 10 MG tablet Take 1 tablet (10 mg total) by mouth daily. 08/10/21   Kerin Perna, NP  aspirin EC 81 MG tablet Take 1 tablet by mouth daily. Patient not taking: Reported on 08/10/2021 09/22/17   [provider]  blood glucose meter kit and supplies Dispense based on patient and insurance preference. Use up to four times daily as directed. (FOR ICD-10 E10.9, E11.9). 08/10/21   Kerin Perna, NP  Blood Glucose Monitoring Suppl (ACCU-CHEK GUIDE) w/Device KIT Use to check blood sugar three times daily. 08/10/21   Charlott Rakes, MD  clopidogrel (PLAVIX) 75 MG tablet Take 1 tablet by mouth daily. Patient not taking: Reported on 08/10/2021 09/22/17   [provider]  clopidogrel (PLAVIX) 75 MG tablet Take 1 tablet (75 mg total) by mouth once daily. Patient not taking: Reported on 08/10/2021 06/29/21   Izora Ribas,  MD  empagliflozin (JARDIANCE) 25 MG TABS tablet Take 1 tablet (25 mg total) by mouth daily before breakfast. 08/10/21   Kerin Perna, NP  erythromycin ophthalmic ointment Place a 1/2 inch ribbon of ointment into the lower eyelid twice daily for 7 days. Patient not taking: Reported on 08/10/2021 08/12/20   Teodora Medici, FNP  glucose blood (ACCU-CHEK GUIDE) test strip Use to check blood sugar three times daily. 08/10/21   Charlott Rakes, MD  insulin detemir (LEVEMIR FLEXTOUCH) 100 UNIT/ML FlexPen Inject 10 Units into the skin at bedtime. 08/10/21   Kerin Perna, NP  Insulin Pen Needle (PENTIPS) 32G X 4 MM MISC Use as directed 08/10/21   Kerin Perna, NP  lidocaine (LIDODERM) 5 % Place 1 patch onto the skin daily. Remove & Discard patch within 12 hours or as directed by MD Patient not taking: Reported on 08/10/2021 12/24/20   Henderly, Britni A, PA-C  lisinopril-hydrochlorothiazide (ZESTORETIC) 20-12.5 MG tablet Take 1 tablet by mouth daily. 08/10/21   Kerin Perna, NP  loperamide (IMODIUM) 2 MG capsule Take by mouth. Patient not taking: Reported on 08/10/2021 09/22/17   [provider]  metoprolol succinate (TOPROL-XL) 25 MG 24 hr tablet Take 0.5 tablets (12.5 mg total) by mouth daily. 08/10/21   Kerin Perna, NP  traZODone (DESYREL) 50 MG tablet Take 0.5-1 tablets (25-50 mg total) by mouth at bedtime as needed for sleep. 08/10/21   Kerin Perna, NP  Allergies    Milk-related compounds    Review of Systems   Review of Systems  Physical Exam Updated Vital Signs BP (!) 162/100   Pulse 70   Temp 98.6 F (37 C) (Oral)   Resp 18   Ht '6\' 1"'  (1.854 m)   Wt 125.1 kg   SpO2 98%   BMI 36.39 kg/m  Physical Exam Vitals and nursing note reviewed.  Constitutional:      General: He is not in acute distress.    Appearance: He is well-developed. He is not ill-appearing, toxic-appearing or diaphoretic.  HENT:     Head: Normocephalic and atraumatic.      Right Ear: External ear normal.     Left Ear: External ear normal.  Eyes:     Conjunctiva/sclera: Conjunctivae normal.     Pupils: Pupils are equal, round, and reactive to light.  Neck:     Trachea: Phonation normal.  Cardiovascular:     Rate and Rhythm: Normal rate.  Pulmonary:     Effort: Pulmonary effort is normal.  Abdominal:     General: There is no distension.  Musculoskeletal:        General: Normal range of motion.     Cervical back: Normal range of motion and neck supple.  Skin:    General: Skin is warm and dry.  Neurological:     Mental Status: He is alert and oriented to person, place, and time.     Cranial Nerves: No cranial nerve deficit.     Sensory: No sensory deficit.     Motor: No abnormal muscle tone.     Coordination: Coordination normal.  Psychiatric:        Mood and Affect: Mood normal.        Behavior: Behavior normal.        Thought Content: Thought content normal.        Judgment: Judgment normal.     ED Results / Procedures / Treatments   Labs (all labs ordered are listed, but only abnormal results are displayed) Labs Reviewed - No data to display  EKG None  Radiology CT Head Wo Contrast  Result Date: 08/25/2021 CLINICAL DATA:  Headache, sudden, severe headache, history of cva, patient hypertensive EXAM: CT HEAD WITHOUT CONTRAST TECHNIQUE: Contiguous axial images were obtained from the base of the skull through the vertex without intravenous contrast. RADIATION DOSE REDUCTION: This exam was performed according to the departmental dose-optimization program which includes automated exposure control, adjustment of the mA and/or kV according to patient size and/or use of iterative reconstruction technique. COMPARISON:  MRI head June 16, 2020. CT head Jun 15, 2020. FINDINGS: Brain: No evidence of acute large vascular territory infarction, hemorrhage, hydrocephalus, extra-axial collection or mass lesion/mass effect. Remote infarcts and chronic  microvascular ischemic disease, grossly similar to prior CT head. Vascular: No hyperdense vessel identified. Skull: No acute fracture. Sinuses/Orbits: Clear sinuses.  No acute orbital findings. Other: No mastoid effusions. IMPRESSION: 1. No evidence of acute intracranial abnormality. 2. Remote infarcts and chronic microvascular ischemic disease, grossly similar to prior CT head. MRI could provide more sensitive evaluation for acute infarct if clinically warranted. Electronically Signed   By: Margaretha Sheffield M.D.   On: 08/25/2021 12:05    Procedures Procedures    Medications Ordered in ED Medications  prochlorperazine (COMPAZINE) injection 10 mg (10 mg Intramuscular Given 08/25/21 1715)  ketorolac (TORADOL) 30 MG/ML injection 15 mg (15 mg Intramuscular Given 08/25/21 1717)  diphenhydrAMINE (BENADRYL) injection 50 mg (50  mg Intramuscular Given 08/25/21 1717)    ED Course/ Medical Decision Making/ A&P                           Medical Decision Making Patient presenting with persistent headache for 4 days.  He was worried about having a stroke.  Differential diagnosis includes tension headache, CVA, nonspecific headache, complications from chronic illness.  Problems Addressed: Nonintractable headache, unspecified chronicity pattern, unspecified headache type: complicated acute illness or injury  Amount and/or Complexity of Data Reviewed Independent Historian:     Details: He is a cogent historian  Risk Prescription drug management. Decision regarding hospitalization. Risk Details: Patient presenting with persistent headache.  He was worried about having a stroke.  He has a nonfocal neurologic exam.  No evidence for infectious process.  Patient was treated with medication with resolution of his headache.  No indication for further ED evaluation or hospitalization at this time.           Final Clinical Impression(s) / ED Diagnoses Final diagnoses:  Nonintractable headache,  unspecified chronicity pattern, unspecified headache type    Rx / DC Orders ED Discharge Orders     None         Daleen Bo, MD 08/25/21 2357

## 2021-08-25 NOTE — ED Notes (Signed)
Patient decided to leave stated that they couldn't wait any later

## 2021-08-25 NOTE — Discharge Instructions (Signed)
The testing did not show any serious problems today.  Make sure you are getting plenty of rest.  See your doctor as needed for problems.

## 2021-08-25 NOTE — ED Triage Notes (Signed)
Pt presents with headache x 2 days  Hx of hypertension.  States hx of stroke.  No neuro deficits at time of triage. NIH negative.

## 2021-08-25 NOTE — ED Triage Notes (Signed)
Patient reports that he he has been hving headaches x 2 weeks. Patient reports history of hypertension and strokes x 5. No c/ onumbness or tingling.

## 2021-08-25 NOTE — ED Provider Triage Note (Signed)
Emergency Medicine Provider Triage Evaluation Note  Nathaniel Spears , a 46 y.o. male  was evaluated in triage.  Pt complains of progressively worsening headache for the last 4 days.  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  Patient reports that his symptoms are typically expressive aphasia.  Patient is not displaying any expressive aphasia my examination.  The patient states that this headache is rated as a 7 out of 10, has been progressively worsening for the last 4 days.  Patient states that he has had 1 round of Advil which did not relieve his pain.  Patient denies this being a worsening of his life, denies any new or associated features of this headache.  The patient denies any lightheadedness, dizziness, weakness, unilateral numbness.  Review of Systems  Positive:  Negative:   Physical Exam  BP (!) 176/121 (BP Location: Right Arm)   Pulse 70   Temp 98.9 F (37.2 C) (Oral)   Resp 16   SpO2 100%  Gen:   Awake, no distress   Resp:  Normal effort  MSK:   Moves extremities without difficulty  Other:  No focal neurodeficits on examination  Medical Decision Making  Medically screening exam initiated at 11:39 AM.  Appropriate orders placed.  Gor Collard was informed that the remainder of the evaluation will be completed by another provider, this initial triage assessment does not replace that evaluation, and the importance of remaining in the ED until their evaluation is complete.     Al Decant, PA-C 08/25/21 1140

## 2021-08-31 ENCOUNTER — Other Ambulatory Visit: Payer: Self-pay

## 2021-09-07 ENCOUNTER — Encounter (INDEPENDENT_AMBULATORY_CARE_PROVIDER_SITE_OTHER): Payer: Self-pay | Admitting: Primary Care

## 2021-09-07 ENCOUNTER — Ambulatory Visit (INDEPENDENT_AMBULATORY_CARE_PROVIDER_SITE_OTHER): Payer: Medicaid Other | Admitting: Primary Care

## 2021-09-07 ENCOUNTER — Other Ambulatory Visit: Payer: Self-pay

## 2021-09-07 VITALS — BP 166/111 | HR 72 | Temp 98.0°F | Ht 73.0 in | Wt 277.4 lb

## 2021-09-07 DIAGNOSIS — G8929 Other chronic pain: Secondary | ICD-10-CM | POA: Diagnosis not present

## 2021-09-07 DIAGNOSIS — M545 Low back pain, unspecified: Secondary | ICD-10-CM

## 2021-09-07 DIAGNOSIS — Z23 Encounter for immunization: Secondary | ICD-10-CM

## 2021-09-07 DIAGNOSIS — I10 Essential (primary) hypertension: Secondary | ICD-10-CM

## 2021-09-07 NOTE — Progress Notes (Signed)
Nathaniel Spears is a 46 y.o. male presents for hypertension evaluation, Denies shortness of breath, headaches, chest pain or lower extremity edema, sudden onset, vision changes, unilateral weakness, dizziness, paresthesias.  Patient is pleased to provide his readings for his diabetes fasting  200-230.  He has not been experiencing any increase in urination, thirst, or hunger. Chronic back pain was question can this elevate your BP- yes  Patient denies adherence with medications.  Dietary habits include: No diet restrictions Exercise habits include: None  Family / Social history: Unknown   Past Medical History:  Diagnosis Date   Hypertension    Stroke [facility])    Past Surgical History:  Procedure Laterality Date   BUBBLE STUDY  06/01/2020   Procedure: BUBBLE STUDY;  Surgeon: Donato Heinz, MD;  Location: [place];  Service: Cardiovascular;;   IR ANGIO INTRA EXTRACRAN SEL COM CAROTID INNOMINATE BILAT MOD SED  06/02/2020   IR ANGIO VERTEBRAL SEL SUBCLAVIAN INNOMINATE BILAT MOD SED  06/02/2020   TEE WITHOUT CARDIOVERSION N/A 06/01/2020   Procedure: TRANSESOPHAGEAL ECHOCARDIOGRAM (TEE);  Surgeon: Donato Heinz, MD;  Location: [facility] ENDOSCOPY;  Service: Cardiovascular;  Laterality: N/A;   Allergies  Allergen Reactions   Milk-Related Compounds    Current Outpatient Medications on File Prior to Visit  Medication Sig Dispense Refill   Accu-Chek Softclix Lancets lancets Use to check blood sugar three times daily. 100 each 2   Blood Glucose Monitoring Suppl (ACCU-CHEK GUIDE) w/Device KIT Use to check blood sugar three times daily. 1 kit 0   empagliflozin (JARDIANCE) 25 MG TABS tablet Take 1 tablet (25 mg total) by mouth daily before breakfast. 90 tablet 1   erythromycin ophthalmic ointment Place a 1/2 inch ribbon of ointment into the lower eyelid twice daily for 7 days. 3.5 g 0   glucose blood (ACCU-CHEK GUIDE) test strip Use to check blood sugar  three times daily. 100 each 2   insulin detemir (LEVEMIR FLEXTOUCH) 100 UNIT/ML FlexPen Inject 10 Units into the skin at bedtime. 15 mL 11   Insulin Pen Needle (PENTIPS) 32G X 4 MM MISC Use as directed 100 each 1   lisinopril-hydrochlorothiazide (ZESTORETIC) 20-12.5 MG tablet Take 1 tablet by mouth daily. 90 tablet 1   metoprolol succinate (TOPROL-XL) 25 MG 24 hr tablet Take 0.5 tablets (12.5 mg total) by mouth daily. 45 tablet 3   traZODone (DESYREL) 50 MG tablet Take 0.5-1 tablets (25-50 mg total) by mouth at bedtime as needed for sleep. 30 tablet 3   amLODipine (NORVASC) 10 MG tablet Take 1 tablet (10 mg total) by mouth daily. (Patient not taking: Reported on 09/07/2021) 90 tablet 1   clopidogrel (PLAVIX) 75 MG tablet Take 1 tablet (75 mg total) by mouth once daily. (Patient not taking: Reported on 08/10/2021) 30 tablet 3   loperamide (IMODIUM) 2 MG capsule Take by mouth. (Patient not taking: Reported on 08/10/2021)     No current facility-administered medications on file prior to visit.   Social History   Socioeconomic History   Marital status: Single    Spouse name: Not on file   Number of children: Not on file   Years of education: Not on file   Highest education level: Not on file  Occupational History   Not on file  Tobacco Use   Smoking status: Former   Smokeless tobacco: Never  Vaping Use   Vaping Use: Never used  Substance and Sexual Activity   Alcohol use: Not Currently  Drug use: Never   Sexual activity: Not Currently  Other Topics Concern   Not on file  Social History Narrative   Not on file   Social Determinants of Health   Financial Resource Strain: Not on file  Food Insecurity: Not on file  Transportation Needs: Not on file  Physical Activity: Not on file  Stress: Not on file  Social Connections: Not on file  Intimate Partner Violence: Not on file   Family History  Problem Relation Age of Onset   Stroke Neg Hx      OBJECTIVE:  Vitals:   09/07/21  1546 09/07/21 1602  BP: (!) 175/116 (!) 166/111  Pulse: 72 72  Temp: 98 F (36.7 C)   TempSrc: Oral   SpO2: 93%   Weight: 277 lb 6.4 oz (125.8 kg)   Height: '6\' 1"'  (1.854 m)     Physical Exam Vitals reviewed.  Constitutional:      Appearance: He is obese.  HENT:     Head: Normocephalic.     Right Ear: Tympanic membrane and external ear normal.     Left Ear: Tympanic membrane and external ear normal.     Nose: Nose normal.  Eyes:     Extraocular Movements: Extraocular movements intact.  Cardiovascular:     Rate and Rhythm: Normal rate and regular rhythm.  Pulmonary:     Effort: Pulmonary effort is normal.     Breath sounds: Normal breath sounds.  Abdominal:     General: Bowel sounds are normal. There is distension.     Palpations: Abdomen is soft.  Musculoskeletal:        General: Normal range of motion.     Cervical back: Normal range of motion and neck supple.  Skin:    General: Skin is warm and dry.  Neurological:     Mental Status: He is alert and oriented to person, place, and time.  Psychiatric:        Mood and Affect: Mood normal.        Behavior: Behavior normal.    ROS Comprehensive ROS Pertinent positive and negative noted in HPI   Last 3 Office BP readings: BP Readings from Last 3 Encounters:  09/07/21 (!) 166/111  08/25/21 (!) 162/100  08/25/21 (!) 176/121    BMET    Component Value Date/Time   NA 138 08/25/2021 1240   K 3.5 08/25/2021 1240   CL 106 08/25/2021 1240   CO2 23 08/25/2021 1240   GLUCOSE 165 (H) 08/25/2021 1240   BUN 13 08/25/2021 1240   CREATININE 1.16 08/25/2021 1240   CALCIUM 9.0 08/25/2021 1240   GFRNONAA >60 08/25/2021 1240    Renal function: Estimated Creatinine Clearance: 110.6 mL/min (by C-G formula based on SCr of 1.16 mg/dL).  Clinical ASCVD:  The ASCVD Risk score (Arnett DK, et al., 2019) failed to calculate for the following reasons:   The patient has a prior MI or stroke diagnosis  ASCVD risk factors include-  [place]   ASSESSMENT & PLAN: [name] was seen today for blood pressure check and referral.  Diagnoses and all orders for this visit:  Need for immunization against influenza -     Flu Vaccine QUAD 21moIM (Fluarix, Fluzone & Alfiuria Quad PF)   Chronic midline low back pain without sciatica -     Ambulatory referral to Neurosurgery   Essential hypertension -Counseled on lifestyle modifications for blood pressure control including reduced dietary sodium, increased exercise, weight reduction and adequate sleep. Also, educated patient  about the risk for cardiovascular events, stroke and heart attack.  African-American obese male .also counseled patient about the importance of medication adherence. If you participate in smoking, it is important to stop using tobacco as this will increase the risks associated with uncontrolled blood pressure Goal BP:  For patients younger than 60: Goal BP < 130/80. For patients 60 and older: Goal BP < 140/90. For patients with diabetes: Goal BP < 130/80. Your most recent BP: 166/111  Minimize salt intake. Minimize alcohol intake    This note has been created with Surveyor, quantity. Any transcriptional errors are unintentional.   Kerin Perna, NP 09/07/2021, 4:14 PM

## 2021-09-07 NOTE — Patient Instructions (Signed)

## 2021-09-14 ENCOUNTER — Other Ambulatory Visit: Payer: Self-pay

## 2021-09-21 ENCOUNTER — Other Ambulatory Visit: Payer: Self-pay

## 2021-09-22 ENCOUNTER — Other Ambulatory Visit: Payer: Self-pay

## 2021-09-28 ENCOUNTER — Other Ambulatory Visit: Payer: Self-pay

## 2021-09-28 ENCOUNTER — Ambulatory Visit (INDEPENDENT_AMBULATORY_CARE_PROVIDER_SITE_OTHER): Payer: Medicaid Other | Admitting: Primary Care

## 2021-09-28 ENCOUNTER — Encounter (INDEPENDENT_AMBULATORY_CARE_PROVIDER_SITE_OTHER): Payer: Self-pay | Admitting: Primary Care

## 2021-09-28 VITALS — BP 148/95 | HR 77 | Resp 16 | Wt 275.6 lb

## 2021-09-28 DIAGNOSIS — I10 Essential (primary) hypertension: Secondary | ICD-10-CM

## 2021-09-28 DIAGNOSIS — I6603 Occlusion and stenosis of bilateral middle cerebral arteries: Secondary | ICD-10-CM | POA: Diagnosis not present

## 2021-09-28 MED ORDER — METOPROLOL TARTRATE 25 MG PO TABS
12.5000 mg | ORAL_TABLET | Freq: Two times a day (BID) | ORAL | 1 refills | Status: DC
  Filled 2021-09-28: qty 90, 90d supply, fill #0

## 2021-09-28 MED ORDER — LISINOPRIL-HYDROCHLOROTHIAZIDE 20-12.5 MG PO TABS
2.0000 | ORAL_TABLET | Freq: Every day | ORAL | 1 refills | Status: DC
  Filled 2021-09-28: qty 90, 45d supply, fill #0

## 2021-09-28 NOTE — Patient Instructions (Signed)
Calorie Counting for Weight Loss Calories are units of energy. Your body needs a certain number of calories from food to keep going throughout the day. When you eat or drink more calories than your body needs, your body stores the extra calories mostly as fat. When you eat or drink fewer calories than your body needs, your body burns fat to get the energy it needs. Calorie counting means keeping track of how many calories you eat and drink each day. Calorie counting can be helpful if you need to lose weight. If you eat fewer calories than your body needs, you should lose weight. Ask your health care provider what a healthy weight is for you. For calorie counting to work, you will need to eat the right number of calories each day to lose a healthy amount of weight per week. A dietitian can help you figure out how many calories you need in a day and will suggest ways to reach your calorie goal. A healthy amount of weight to lose each week is usually 1-2 lb (0.5-0.9 kg). This usually means that your daily calorie intake should be reduced by 500-750 calories. Eating 1,200-1,500 calories a day can help most women lose weight. Eating 1,500-1,800 calories a day can help most men lose weight. What do I need to know about calorie counting? Work with your health care provider or dietitian to determine how many calories you should get each day. To meet your daily calorie goal, you will need to: Find out how many calories are in each food that you would like to eat. Try to do this before you eat. Decide how much of the food you plan to eat. Keep a food log. Do this by writing down what you ate and how many calories it had. To successfully lose weight, it is important to balance calorie counting with a healthy lifestyle that includes regular activity. Where do I find calorie information?  The number of calories in a food can be found on a Nutrition Facts label. If a food does not have a Nutrition Facts label, try  to look up the calories online or ask your dietitian for help. Remember that calories are listed per serving. If you choose to have more than one serving of a food, you will have to multiply the calories per serving by the number of servings you plan to eat. For example, the label on a package of bread might say that a serving size is 1 slice and that there are 90 calories in a serving. If you eat 1 slice, you will have eaten 90 calories. If you eat 2 slices, you will have eaten 180 calories. How do I keep a food log? After each time that you eat, record the following in your food log as soon as possible: What you ate. Be sure to include toppings, sauces, and other extras on the food. How much you ate. This can be measured in cups, ounces, or number of items. How many calories were in each food and drink. The total number of calories in the food you ate. Keep your food log near you, such as in a pocket-sized notebook or on an app or website on your mobile phone. Some programs will calculate calories for you and show you how many calories you have left to meet your daily goal. What are some portion-control tips? Know how many calories are in a serving. This will help you know how many servings you can have of a certain   food. Use a measuring cup to measure serving sizes. You could also try weighing out portions on a kitchen scale. With time, you will be able to estimate serving sizes for some foods. Take time to put servings of different foods on your favorite plates or in your favorite bowls and cups so you know what a serving looks like. Try not to eat straight from a food's packaging, such as from a bag or box. Eating straight from the package makes it hard to see how much you are eating and can lead to overeating. Put the amount you would like to eat in a cup or on a plate to make sure you are eating the right portion. Use smaller plates, glasses, and bowls for smaller portions and to prevent  overeating. Try not to multitask. For example, avoid watching TV or using your computer while eating. If it is time to eat, sit down at a table and enjoy your food. This will help you recognize when you are full. It will also help you be more mindful of what and how much you are eating. What are tips for following this plan? Reading food labels Check the calorie count compared with the serving size. The serving size may be smaller than what you are used to eating. Check the source of the calories. Try to choose foods that are high in protein, fiber, and vitamins, and low in saturated fat, trans fat, and sodium. Shopping Read nutrition labels while you shop. This will help you make healthy decisions about which foods to buy. Pay attention to nutrition labels for low-fat or fat-free foods. These foods sometimes have the same number of calories or more calories than the full-fat versions. They also often have added sugar, starch, or salt to make up for flavor that was removed with the fat. Make a grocery list of lower-calorie foods and stick to it. Cooking Try to cook your favorite foods in a healthier way. For example, try baking instead of frying. Use low-fat dairy products. Meal planning Use more fruits and vegetables. One-half of your plate should be fruits and vegetables. Include lean proteins, such as chicken, turkey, and fish. Lifestyle Each week, aim to do one of the following: 150 minutes of moderate exercise, such as walking. 75 minutes of vigorous exercise, such as running. General information Know how many calories are in the foods you eat most often. This will help you calculate calorie counts faster. Find a way of tracking calories that works for you. Get creative. Try different apps or programs if writing down calories does not work for you. What foods should I eat?  Eat nutritious foods. It is better to have a nutritious, high-calorie food, such as an avocado, than a food with  few nutrients, such as a bag of potato chips. Use your calories on foods and drinks that will fill you up and will not leave you hungry soon after eating. Examples of foods that fill you up are nuts and nut butters, vegetables, lean proteins, and high-fiber foods such as whole grains. High-fiber foods are foods with more than 5 g of fiber per serving. Pay attention to calories in drinks. Low-calorie drinks include water and unsweetened drinks. The items listed above may not be a complete list of foods and beverages you can eat. Contact a dietitian for more information. What foods should I limit? Limit foods or drinks that are not good sources of vitamins, minerals, or protein or that are high in unhealthy fats. These   include: Candy. Other sweets. Sodas, specialty coffee drinks, alcohol, and juice. The items listed above may not be a complete list of foods and beverages you should avoid. Contact a dietitian for more information. How do I count calories when eating out? Pay attention to portions. Often, portions are much larger when eating out. Try these tips to keep portions smaller: Consider sharing a meal instead of getting your own. If you get your own meal, eat only half of it. Before you start eating, ask for a container and put half of your meal into it. When available, consider ordering smaller portions from the menu instead of full portions. Pay attention to your food and drink choices. Knowing the way food is cooked and what is included with the meal can help you eat fewer calories. If calories are listed on the menu, choose the lower-calorie options. Choose dishes that include vegetables, fruits, whole grains, low-fat dairy products, and lean proteins. Choose items that are boiled, broiled, grilled, or steamed. Avoid items that are buttered, battered, fried, or served with cream sauce. Items labeled as crispy are usually fried, unless stated otherwise. Choose water, low-fat milk,  unsweetened iced tea, or other drinks without added sugar. If you want an alcoholic beverage, choose a lower-calorie option, such as a glass of wine or light beer. Ask for dressings, sauces, and syrups on the side. These are usually high in calories, so you should limit the amount you eat. If you want a salad, choose a garden salad and ask for grilled meats. Avoid extra toppings such as bacon, cheese, or fried items. Ask for the dressing on the side, or ask for olive oil and vinegar or lemon to use as dressing. Estimate how many servings of a food you are given. Knowing serving sizes will help you be aware of how much food you are eating at restaurants. Where to find more information Centers for Disease Control and Prevention: www.cdc.gov U.[place]. Department of Agriculture: myplate.gov Summary Calorie counting means keeping track of how many calories you eat and drink each day. If you eat fewer calories than your body needs, you should lose weight. A healthy amount of weight to lose per week is usually 1-2 lb (0.5-0.9 kg). This usually means reducing your daily calorie intake by 500-750 calories. The number of calories in a food can be found on a Nutrition Facts label. If a food does not have a Nutrition Facts label, try to look up the calories online or ask your dietitian for help. Use smaller plates, glasses, and bowls for smaller portions and to prevent overeating. Use your calories on foods and drinks that will fill you up and not leave you hungry shortly after a meal. This information is not intended to replace advice given to you by your health care provider. Make sure you discuss any questions you have with your health care provider. Document Revised: 02/13/2019 Document Reviewed: 02/13/2019 Elsevier Patient Education  2023 Elsevier Inc.  

## 2021-09-28 NOTE — Progress Notes (Signed)
[place]   [name] is a 46 y.o. male presents for hypertension evaluation, Denies shortness of breath, headaches, chest pain or lower extremity edema, sudden onset, vision changes, unilateral weakness, dizziness, paresthesias   Patient reports adherence with medications.  Dietary habits include: monitoring sodium intake  Exercise habits include:none Family / Social history: None   Past Medical History:  Diagnosis Date   Hypertension    Stroke [facility])    Past Surgical History:  Procedure Laterality Date   BUBBLE STUDY  06/01/2020   Procedure: BUBBLE STUDY;  Surgeon: Donato Heinz, MD;  Location: [facility] ENDOSCOPY;  Service: Cardiovascular;;   IR ANGIO INTRA EXTRACRAN SEL COM CAROTID INNOMINATE BILAT MOD SED  06/02/2020   IR ANGIO VERTEBRAL SEL SUBCLAVIAN INNOMINATE BILAT MOD SED  06/02/2020   TEE WITHOUT CARDIOVERSION N/A 06/01/2020   Procedure: TRANSESOPHAGEAL ECHOCARDIOGRAM (TEE);  Surgeon: Donato Heinz, MD;  Location: [facility] ENDOSCOPY;  Service: Cardiovascular;  Laterality: N/A;   Allergies  Allergen Reactions   Milk-Related Compounds    Current Outpatient Medications on File Prior to Visit  Medication Sig Dispense Refill   Accu-Chek Softclix Lancets lancets Use to check blood sugar three times daily. 100 each 2   amLODipine (NORVASC) 10 MG tablet Take 1 tablet (10 mg total) by mouth daily. (Patient not taking: Reported on 09/07/2021) 90 tablet 1   Blood Glucose Monitoring Suppl (ACCU-CHEK GUIDE) w/Device KIT Use to check blood sugar three times daily. 1 kit 0   clopidogrel (PLAVIX) 75 MG tablet Take 1 tablet (75 mg total) by mouth once daily. (Patient not taking: Reported on 08/10/2021) 30 tablet 3   empagliflozin (JARDIANCE) 25 MG TABS tablet Take 1 tablet (25 mg total) by mouth daily before breakfast. 90 tablet 1   erythromycin ophthalmic ointment Place a 1/2 inch ribbon of ointment into the lower eyelid twice daily for 7 days. 3.5 g 0    glucose blood (ACCU-CHEK GUIDE) test strip Use to check blood sugar three times daily. 100 each 2   insulin detemir (LEVEMIR FLEXTOUCH) 100 UNIT/ML FlexPen Inject 10 Units into the skin at bedtime. 15 mL 11   Insulin Pen Needle (PENTIPS) 32G X 4 MM MISC Use as directed 100 each 1   lisinopril-hydrochlorothiazide (ZESTORETIC) 20-12.5 MG tablet Take 1 tablet by mouth daily. 90 tablet 1   loperamide (IMODIUM) 2 MG capsule Take by mouth. (Patient not taking: Reported on 08/10/2021)     traZODone (DESYREL) 50 MG tablet Take 0.5-1 tablets (25-50 mg total) by mouth at bedtime as needed for sleep. 30 tablet 3   No current facility-administered medications on file prior to visit.   Social History   Socioeconomic History   Marital status: Single    Spouse name: Not on file   Number of children: Not on file   Years of education: Not on file   Highest education level: Not on file  Occupational History   Not on file  Tobacco Use   Smoking status: Former   Smokeless tobacco: Never  Vaping Use   Vaping Use: Never used  Substance and Sexual Activity   Alcohol use: Not Currently   Drug use: Never   Sexual activity: Not Currently  Other Topics Concern   Not on file  Social History Narrative   Not on file   Social Determinants of Health   Financial Resource Strain: Not on file  Food Insecurity: Not on file  Transportation Needs: Not on file  Physical Activity: Not on file  Stress: Not on file  Social Connections: Not on file  Intimate Partner Violence: Not on file   Family History  Problem Relation Age of Onset   Stroke Neg Hx      OBJECTIVE:  Vitals:   09/28/21 1539 09/28/21 1548  BP: (!) 162/100 (!) 148/95  Pulse: 77   Resp: 16   SpO2: 96%   Weight: 275 lb 9.6 oz (125 kg)     Physical Exam General appearance : Awake, alert, not in any distress. Speech Clear. Not toxic looking HEENT: Atraumatic and Normocephalic, pupils equally reactive to light and accomodation Neck:  Supple, no JVD. No cervical lymphadenopathy.  Chest: Good air entry bilaterally, no added sounds  CVS: S1 S2 regular, no murmurs.  Abdomen: Bowel sounds present, Non tender and not distended with no gaurding, rigidity or rebound. Extremities: B/L Lower Ext shows no edema, both legs are warm to touch Neurology: Awake alert, and oriented X 3,  Non focal Skin: No Rash   ROS Comprehensive ROS Pertinent positive and negative noted in HPI   Last 3 Office BP readings: BP Readings from Last 3 Encounters:  09/28/21 (!) 148/95  09/07/21 (!) 166/111  08/25/21 (!) 162/100    BMET    Component Value Date/Time   NA 138 08/25/2021 1240   K 3.5 08/25/2021 1240   CL 106 08/25/2021 1240   CO2 23 08/25/2021 1240   GLUCOSE 165 (H) 08/25/2021 1240   BUN 13 08/25/2021 1240   CREATININE 1.16 08/25/2021 1240   CALCIUM 9.0 08/25/2021 1240   GFRNONAA >60 08/25/2021 1240    Renal function: CrCl cannot be calculated ([name] most recent lab result is older than the maximum 21 days allowed.).  Clinical ASCVD: Yes  The ASCVD Risk score (Arnett DK, et al., 2019) failed to calculate for the following reasons:   The patient has a prior MI or stroke diagnosis  ASCVD risk factors include- Nathaniel Spears   ASSESSMENT & PLAN:  Nathaniel Spears was seen today for hypertension.  Diagnoses and all orders for this visit:  -    Middle cerebral artery embolism, bilateral -     Ambulatory referral to Neurology  Other orders -     metoprolol tartrate (LOPRESSOR) 25 MG tablet; Take 0.5 tablets (12.5 mg total) by mouth 2 (two) times daily.   Essential hypertension -Counseled on lifestyle modifications for blood pressure control including reduced dietary sodium, increased exercise, weight reduction and adequate sleep. Also, educated patient about the risk for cardiovascular events, stroke and heart attack. Also counseled patient about the importance of medication adherence. If you participate in smoking, it is important to  stop using tobacco as this will increase the risks associated with uncontrolled blood pressure.  Goal BP:  For patients younger than 60: Goal BP < 130/80. For patients 60 and older: Goal BP < 140/90. For patients with diabetes: Goal BP < 130/80. Your most recent BP: 148/95  Minimize salt intake. Minimize alcohol intake Medication adjusted did not tolerate amlodipine discontinued added  lisinopril-hydrochlorothiazide (ZESTORETIC) 20-12.5 MG tablet; Take 2 tablets by mouth daily.  And metoprolol 12.5 mg twice daily   This note has been created with Surveyor, quantity. Any transcriptional errors are unintentional.   Kerin Perna, NP 09/28/2021, 3:50 PM

## 2021-09-29 ENCOUNTER — Other Ambulatory Visit: Payer: Self-pay

## 2021-09-30 ENCOUNTER — Other Ambulatory Visit: Payer: Self-pay

## 2021-10-12 DIAGNOSIS — G8929 Other chronic pain: Secondary | ICD-10-CM | POA: Diagnosis not present

## 2021-10-12 DIAGNOSIS — M542 Cervicalgia: Secondary | ICD-10-CM | POA: Diagnosis not present

## 2021-10-12 DIAGNOSIS — Z6836 Body mass index (BMI) 36.0-36.9, adult: Secondary | ICD-10-CM | POA: Diagnosis not present

## 2021-10-13 ENCOUNTER — Other Ambulatory Visit: Payer: Self-pay

## 2021-10-13 ENCOUNTER — Other Ambulatory Visit: Payer: Self-pay | Admitting: Pharmacist

## 2021-10-13 ENCOUNTER — Ambulatory Visit: Payer: Medicaid Other | Attending: Primary Care | Admitting: Pharmacist

## 2021-10-13 VITALS — BP 151/83 | HR 90

## 2021-10-13 DIAGNOSIS — I10 Essential (primary) hypertension: Secondary | ICD-10-CM

## 2021-10-13 MED ORDER — BLOOD PRESSURE CUFF MISC
0 refills | Status: AC
  Filled 2021-10-13: qty 1, fill #0

## 2021-10-13 MED ORDER — CARVEDILOL 6.25 MG PO TABS
6.2500 mg | ORAL_TABLET | Freq: Two times a day (BID) | ORAL | 3 refills | Status: DC
  Filled 2021-10-13: qty 60, 30d supply, fill #0
  Filled 2021-11-10: qty 60, 30d supply, fill #1
  Filled 2021-12-14 – 2021-12-23 (×2): qty 60, 30d supply, fill #2
  Filled 2022-02-01 (×2): qty 60, 30d supply, fill #3

## 2021-10-13 MED ORDER — VALSARTAN-HYDROCHLOROTHIAZIDE 320-25 MG PO TABS
1.0000 | ORAL_TABLET | Freq: Every day | ORAL | 3 refills | Status: DC
  Filled 2021-10-13: qty 90, 90d supply, fill #0
  Filled 2022-01-20: qty 90, 90d supply, fill #1
  Filled 2022-04-17: qty 90, 90d supply, fill #2
  Filled 2022-07-24: qty 90, 90d supply, fill #3

## 2021-10-13 NOTE — Progress Notes (Signed)
   S:    PCP: Sharyn Lull   No chief complaint on file.  Nathaniel Spears is a 46 y.o. male who presents for hypertension evaluation, education, and management.  PMH is significant for HTN, T2DM, acute CVA, Ischemic stroke (5 strokes last year).  Patient was referred and last seen by Primary Care Provider, Sharyn Lull, on 09/28/2021.   At last visit, BP was 148/95, amlodipine was stopped and Lisinopril/HCTZ 20-12.5 was started.   Today, patient arrives in good spirits and presents with his girl friend. Denies dizziness, headache, blurred vision, swelling.   Patient reports hypertension is longstanding.  Family/Social history:  -Fhx: none listed -Tobacco: former smoker  Medication adherence is appropriate. Patient has taken BP medications today.   Current antihypertensives include: lisinopril/HCTZ 20-12.5 mg twice daily, metoprolol 25 mg daily.   Antihypertensives tried in the past include: amlodipine 10 mg daily.  Patient doesn't have a BP cuff at home.  Patient reported dietary habits: Eats 2 meals/day -Patient is adherent with salt restrictions. -Caffeine: doesn't drink coffee, drinks 2-3 sodas daily   Patient-reported exercise habits:  -Patient doesn't have a current exercising regimen, but is planning to start working out.  O:  Vitals:   10/13/21 1557  BP: (!) 151/83  Pulse: 90   Last 3 Office BP readings: BP Readings from Last 3 Encounters:  10/13/21 (!) 151/83  09/28/21 (!) 148/95  09/07/21 (!) 166/111   BMET    Component Value Date/Time   NA 138 08/25/2021 1240   K 3.5 08/25/2021 1240   CL 106 08/25/2021 1240   CO2 23 08/25/2021 1240   GLUCOSE 165 (H) 08/25/2021 1240   BUN 13 08/25/2021 1240   CREATININE 1.16 08/25/2021 1240   CALCIUM 9.0 08/25/2021 1240   GFRNONAA >60 08/25/2021 1240   Renal function: CrCl cannot be calculated ([name] most recent lab result is older than the maximum 21 days allowed.).  Clinical ASCVD: Yes  The ASCVD Risk score  (Arnett DK, et al., 2019) failed to calculate for the following reasons:   The patient has a prior MI or stroke diagnosis  A/P: Hypertension longstanding currently uncontrolled on current medications. BP goal < 130/80 mmHg. Medication adherence appears appropriate.  -Discontinued Lisinopril/HCTZ 20-12.5 mg.  -Discontinued Metoprolol 25 mg. -Started Valsartan/HCTZ 320-25 mg once daily. -Started Carvedilol 6.25 mg twice daily. -Patient was advised to bring his glucometer next visit for diabetes management.  -Patient educated on purpose, proper use, and potential adverse effects of Valsartan/HCTZ and Carvedilol.  -F/u labs ordered - none -Counseled on lifestyle modifications for blood pressure control including reduced dietary sodium, increased exercise, adequate sleep. -Encouraged patient to check BP at home and bring log of readings to next visit. Counseled on proper use of home BP cuff.    Results reviewed and written information provided.    Written patient instructions provided. Patient verbalized understanding of treatment plan.  Total time in face to face counseling 30 minutes.    Follow-up:  Pharmacist in 1 month.   Patient seen with: Deirdre Evener, PharmD Candidate [employer]  Class of 2025    Pharmacist:  Benard Halsted, PharmD, [place], [place] [phone]

## 2021-10-14 ENCOUNTER — Other Ambulatory Visit: Payer: Self-pay

## 2021-10-17 ENCOUNTER — Ambulatory Visit (INDEPENDENT_AMBULATORY_CARE_PROVIDER_SITE_OTHER): Payer: Self-pay

## 2021-10-17 NOTE — Telephone Encounter (Signed)
  Chief Complaint: medication advice Symptoms: NA Frequency: today Pertinent Negatives: NA Disposition: [] ED /[] Urgent Care (no appt availability in office) / [] Appointment(In office/virtual)/ []  [place] [facility]/ [x] Home Care/ [] Refused Recommended Disposition /[] [place] [facility]/ []  Follow-up with PCP Additional Notes: spoke with [facility] after getting permission from pt. She wanting to make sure pt is taking correct BP meds. Advised her after reviewing OV notes on 10/13/21 pt is suppose to only take valsartan-hctz and carvedilol. She verbalized understanding and didn't need any further assistance.   Summary: medication confirmation    was prescribed Rx  valsartan-hydrochlorothiazide (DIOVAN-HCT) 320-25 MG tablet  and  carvedilol (COREG) 6.25 MG tablet for BP last week and before he was taking Metoprolol Tartrate , Amlodipine and Lisinopril Hctz / [name] want to make sure he is not suppose to be taking any of the previous medications and should just be taking the new ones prescribed on 9.28.23  / please advise harriet or call pt at [phone]      Reason for Disposition  Caller has medicine question only, adult not sick, AND triager answers question  Answer Assessment - Initial Assessment Questions 1. NAME of MEDICINE: "What medicine(s) are you calling about?"     Valsartan-hctz, carvedilol  2. QUESTION: "What is your question?" (e.g., double dose of medicine, side effect)     Wanting to make sure pt is suppose to be taking only those medications vs the others.  3. PRESCRIBER: "Who prescribed the medicine?" Reason: if prescribed by specialist, call should be referred to that group.     Sharyn Lull, NP  Protocols used: Medication Question Call-A-AH

## 2021-10-24 ENCOUNTER — Other Ambulatory Visit: Payer: Self-pay

## 2021-10-25 ENCOUNTER — Other Ambulatory Visit: Payer: Self-pay

## 2021-10-28 ENCOUNTER — Other Ambulatory Visit: Payer: Self-pay

## 2021-11-02 LAB — GLUCOSE, POCT (MANUAL RESULT ENTRY): POC Glucose: 189 mg/dl — AB (ref 70–99)

## 2021-11-10 ENCOUNTER — Other Ambulatory Visit: Payer: Self-pay

## 2021-11-11 ENCOUNTER — Other Ambulatory Visit: Payer: Self-pay

## 2021-11-16 ENCOUNTER — Ambulatory Visit (INDEPENDENT_AMBULATORY_CARE_PROVIDER_SITE_OTHER): Payer: Medicaid Other | Admitting: Primary Care

## 2021-11-16 ENCOUNTER — Encounter (INDEPENDENT_AMBULATORY_CARE_PROVIDER_SITE_OTHER): Payer: Self-pay | Admitting: Primary Care

## 2021-11-16 VITALS — BP 140/88 | HR 73 | Resp 16 | Wt 277.8 lb

## 2021-11-16 DIAGNOSIS — E1165 Type 2 diabetes mellitus with hyperglycemia: Secondary | ICD-10-CM | POA: Diagnosis not present

## 2021-11-16 DIAGNOSIS — I10 Essential (primary) hypertension: Secondary | ICD-10-CM

## 2021-11-16 DIAGNOSIS — Z794 Long term (current) use of insulin: Secondary | ICD-10-CM | POA: Diagnosis not present

## 2021-11-16 DIAGNOSIS — E876 Hypokalemia: Secondary | ICD-10-CM | POA: Diagnosis not present

## 2021-11-16 NOTE — Progress Notes (Addendum)
Nathaniel Spears is a 46 y.o. male who presents for hypertension follow up after being evaluated and medication changed by clinical pharmacist with improvement.  PMH is significant for HTN, T2DM, acute CVA, Ischemic stroke (5 strokes last year). Today he denies . Denies dizziness, headache, blurred vision, swelling. Chest pain or edema   Family/Social history:  -Fhx: none listed -Tobacco: former smoker   O:  BP (!) 140/88 (BP Location: Left Leg, Patient Position: Sitting, Cuff Size: Large)   Pulse 73   Resp 16   Wt 277 lb 12.8 oz (126 kg)   SpO2 97%   BMI 36.65 kg/m   Clinical ASCVD: Yes  The ASCVD Risk score (Arnett DK, et al., 2019) failed to calculate for the following reasons:   The patient has a prior MI or stroke diagnosis   Comprehensive ROS Pertinent positive and negative noted in HPI   Physical Exam General: No apparent distress. Eyes: Extraocular eye movements intact, pupils equal and round. Neck: Supple, trachea midline.Thick Thyroid: No enlargement, mobile without fixation, no tenderness. Cardiovascular: Regular rhythm and rate, no murmur, normal radial pulses. Respiratory: Normal respiratory effort, clear to auscultation. Gastrointestinal: Normal pitch active bowel sounds, nontender abdomen without distention or appreciable hepatomegaly. Skin: Appropriate warmth, no visible rash. Mental status: Alert,    [name] was seen today for blood pressure check.  Diagnoses and all orders for this visit:  Essential hypertension Hypertension longstanding currently uncontrolled on current medications. BP goal < 130/80 Explained that having normal blood pressure is the goal and medications are helping to get to goal and maintain normal blood pressure. DIET: Limit salt intake, read nutrition labels to check salt content, limit fried and high fatty foods  Avoid using multisymptom OTC cold preparations that generally contain sudafed which can  rise BP. Consult with pharmacist on best cold relief products to use for persons with HTN EXERCISE Discussed incorporating exercise such as walking - 30 minutes most days of the week and can do in 10 minute intervals    Continue   Valsartan/HCTZ 320-25 mg once daily. Carvedilol 6.25 mg twice daily.   Type 2 diabetes mellitus with hyperglycemia, with long-term current use of insulin (HCC) -     Lipid Panel  Hypokalemia -     CMP14+EGFR    This note has been created with Surveyor, quantity. Any transcriptional errors are unintentional.   Kerin Perna, NP 11/16/2021, 1:53 PM

## 2021-11-16 NOTE — Addendum Note (Signed)
Addended by: Juluis Mire on: 11/16/2021 02:04 PM   Modules accepted: Orders, Level of Service

## 2021-11-17 LAB — LIPID PANEL
Chol/HDL Ratio: 3.8 ratio (ref 0.0–5.0)
Cholesterol, Total: 109 mg/dL (ref 100–199)
HDL: 29 mg/dL — ABNORMAL LOW (ref >39)
LDL Chol Calc (NIH): 54 mg/dL (ref 0–99)
Triglycerides: 148 mg/dL (ref 0–149)
VLDL Cholesterol Cal: 26 mg/dL (ref 5–40)

## 2021-11-17 LAB — CMP14+EGFR
ALT: 15 IU/L (ref 0–44)
AST: 17 IU/L (ref 0–40)
Albumin/Globulin Ratio: 1.5 (ref 1.2–2.2)
Albumin: 4.3 g/dL (ref 4.1–5.1)
Alkaline Phosphatase: 102 IU/L (ref 44–121)
BUN/Creatinine Ratio: 12 (ref 9–20)
BUN: 16 mg/dL (ref 6–24)
Bilirubin Total: 0.3 mg/dL (ref 0.0–1.2)
CO2: 28 mmol/L (ref 20–29)
Calcium: 8.9 mg/dL (ref 8.7–10.2)
Chloride: 97 mmol/L (ref 96–106)
Creatinine, Ser: 1.3 mg/dL — ABNORMAL HIGH (ref 0.76–1.27)
Globulin, Total: 2.8 g/dL (ref 1.5–4.5)
Glucose: 161 mg/dL — ABNORMAL HIGH (ref 70–99)
Potassium: 3.8 mmol/L (ref 3.5–5.2)
Sodium: 140 mmol/L (ref 134–144)
Total Protein: 7.1 g/dL (ref 6.0–8.5)
eGFR: 69 mL/min/{1.73_m2} (ref >59)

## 2021-11-18 ENCOUNTER — Other Ambulatory Visit: Payer: Self-pay

## 2021-11-18 ENCOUNTER — Ambulatory Visit: Payer: Medicaid Other | Attending: Internal Medicine | Admitting: Pharmacist

## 2021-11-18 ENCOUNTER — Encounter: Payer: Self-pay | Admitting: Pharmacist

## 2021-11-18 ENCOUNTER — Telehealth: Payer: Self-pay | Admitting: Pharmacist

## 2021-11-18 VITALS — BP 151/101 | HR 81

## 2021-11-18 DIAGNOSIS — I10 Essential (primary) hypertension: Secondary | ICD-10-CM | POA: Diagnosis not present

## 2021-11-18 MED ORDER — AMLODIPINE BESYLATE 5 MG PO TABS
5.0000 mg | ORAL_TABLET | Freq: Every day | ORAL | 1 refills | Status: DC
Start: 2021-11-18 — End: 2022-04-25
  Filled 2021-11-18 – 2021-12-01 (×2): qty 90, 90d supply, fill #0
  Filled 2022-02-27: qty 90, 90d supply, fill #1

## 2021-11-18 NOTE — Telephone Encounter (Signed)
[place] friend,   Can we submit a referral to the Nutritionist for this patient?

## 2021-11-18 NOTE — Progress Notes (Signed)
   S:    PCP: Sharyn Lull   No chief complaint on file.  Nathaniel Spears is a 46 y.o. male who presents for hypertension evaluation, education, and management.   PMH is significant for HTN, T2DM, acute CVA, Ischemic stroke (5 strokes last year).  Patient was referred and last seen by Primary Care Provider, Sharyn Lull, on 11/16/2021. Of note, we saw him in September and changed his lisinopril/HCTZ to valsartan/HCTZ. We also changed his metoprolol to carvedilol. Of note, his amlodipine was stopped at a recent PCP visit. I confirmed with him today he has no contraindication to amlodipine and denies any side effects in the past while on amlodipine.   Today, patient arrives in good spirits and presents with his girl friend. Denies dizziness, headache, blurred vision, swelling.   Patient reports hypertension is longstanding.  Family/Social history:  -Fhx: none listed -Tobacco: former smoker  Medication adherence is appropriate. Patient has taken BP medications today.   Current antihypertensives include: valsartan/HCTZ 360-25 mg twice daily, carvedilol 6.25 mg BID.   Antihypertensives tried in the past include: amlodipine 10 mg daily, lisinopril/HCTZ (change in therapy), metoprolol (change in therapy)  Patient doesn't have a BP cuff at home.  Patient reported dietary habits: Eats 2 meals/day -Patient is adherent with salt restrictions. -Caffeine: doesn't drink coffee, drinks 2-3 sodas daily   Patient-reported exercise habits:  -Patient doesn't have a current exercising regimen, but is planning to start working out.  O:  Vitals:   11/18/21 1605  BP: (!) 151/101  Pulse: 81    Last 3 Office BP readings: BP Readings from Last 3 Encounters:  11/18/21 (!) 151/101  11/16/21 (!) 140/88  11/02/21 (!) 190/110   BMET    Component Value Date/Time   NA 140 11/16/2021 1428   K 3.8 11/16/2021 1428   CL 97 11/16/2021 1428   CO2 28 11/16/2021 1428   GLUCOSE 161 (H) 11/16/2021 1428   GLUCOSE  165 (H) 08/25/2021 1240   BUN 16 11/16/2021 1428   CREATININE 1.30 (H) 11/16/2021 1428   CALCIUM 8.9 11/16/2021 1428   GFRNONAA >60 08/25/2021 1240   Renal function: Estimated Creatinine Clearance: 98.7 mL/min (A) (by C-G formula based on SCr of 1.3 mg/dL (H)).  Clinical ASCVD: Yes  The ASCVD Risk score (Arnett DK, et al., 2019) failed to calculate for the following reasons:   The patient has a prior MI or stroke diagnosis  A/P: Hypertension longstanding currently uncontrolled on current medications. BP goal < 130/80 mmHg. Medication adherence appears appropriate.  -Continue Valsartan/HCTZ 320-25 mg once daily. -Continue Carvedilol 6.25 mg twice daily. -Start amlodipine 5 mg daily.  -Patient educated on purpose, proper use, and potential adverse effects of amlodipine.  -F/u labs ordered - none -Counseled on lifestyle modifications for blood pressure control including reduced dietary sodium, increased exercise, adequate sleep. -Encouraged patient to check BP at home and bring log of readings to next visit. Counseled on proper use of home BP cuff.    Results reviewed and written information provided.    Written patient instructions provided. Patient verbalized understanding of treatment plan.  Total time in face to face counseling 30 minutes.    Follow-up:  Pharmacist in 4-6 weeks.  Benard Halsted, PharmD, [place], [place] [phone]

## 2021-11-21 ENCOUNTER — Other Ambulatory Visit (INDEPENDENT_AMBULATORY_CARE_PROVIDER_SITE_OTHER): Payer: Self-pay | Admitting: Primary Care

## 2021-11-21 DIAGNOSIS — Z794 Long term (current) use of insulin: Secondary | ICD-10-CM

## 2021-11-22 ENCOUNTER — Other Ambulatory Visit: Payer: Self-pay

## 2021-11-25 ENCOUNTER — Other Ambulatory Visit: Payer: Self-pay

## 2021-11-26 ENCOUNTER — Other Ambulatory Visit (HOSPITAL_COMMUNITY): Payer: Self-pay

## 2021-11-28 ENCOUNTER — Other Ambulatory Visit: Payer: Self-pay

## 2021-12-01 ENCOUNTER — Other Ambulatory Visit: Payer: Self-pay

## 2021-12-02 ENCOUNTER — Other Ambulatory Visit: Payer: Self-pay

## 2021-12-14 ENCOUNTER — Other Ambulatory Visit (INDEPENDENT_AMBULATORY_CARE_PROVIDER_SITE_OTHER): Payer: Self-pay | Admitting: Primary Care

## 2021-12-14 DIAGNOSIS — F32A Depression, unspecified: Secondary | ICD-10-CM

## 2021-12-15 ENCOUNTER — Other Ambulatory Visit: Payer: Self-pay

## 2021-12-15 MED ORDER — TRAZODONE HCL 50 MG PO TABS
25.0000 mg | ORAL_TABLET | Freq: Every evening | ORAL | 3 refills | Status: DC | PRN
  Filled 2021-12-15 – 2021-12-23 (×2): qty 30, 30d supply, fill #0
  Filled 2022-01-20: qty 30, 30d supply, fill #1
  Filled 2022-02-27: qty 30, 30d supply, fill #2
  Filled 2022-03-31: qty 30, 30d supply, fill #3

## 2021-12-15 NOTE — Telephone Encounter (Signed)
Requested Prescriptions  Pending Prescriptions Disp Refills   traZODone (DESYREL) 50 MG tablet 30 tablet 3    Sig: Take 0.5-1 tablets (25-50 mg total) by mouth at bedtime as needed for sleep.     Psychiatry: Antidepressants - Serotonin Modulator Passed - 12/14/2021  9:36 PM      Passed - Valid encounter within last 6 months    Recent Outpatient Visits           3 weeks ago Essential hypertension   [place] Lois Huxley, Cornelius Moras, RPH-CPP   4 weeks ago Essential hypertension   [facility] Grayce Sessions, NP   2 months ago Essential hypertension   [place] [facility] Drucilla Chalet, RPH-CPP   2 months ago Essential hypertension   [facility] Grayce Sessions, NP   3 months ago Need for immunization against influenza   [facility] Grayce Sessions, NP       Future Appointments             In 2 weeks Drucilla Chalet, RPH-CPP [facility]

## 2021-12-21 ENCOUNTER — Other Ambulatory Visit: Payer: Self-pay

## 2021-12-23 ENCOUNTER — Other Ambulatory Visit: Payer: Self-pay

## 2021-12-27 ENCOUNTER — Other Ambulatory Visit: Payer: Self-pay

## 2021-12-27 MED ORDER — CHLORHEXIDINE GLUCONATE 0.12 % MT SOLN
15.0000 mL | Freq: Two times a day (BID) | OROMUCOSAL | 0 refills | Status: DC
  Filled 2021-12-27: qty 473, 16d supply, fill #0

## 2021-12-30 ENCOUNTER — Encounter: Payer: Self-pay | Admitting: Pharmacist

## 2021-12-30 ENCOUNTER — Ambulatory Visit: Payer: Medicaid Other | Attending: Family Medicine | Admitting: Pharmacist

## 2021-12-30 VITALS — BP 134/84

## 2021-12-30 DIAGNOSIS — I10 Essential (primary) hypertension: Secondary | ICD-10-CM | POA: Diagnosis not present

## 2021-12-30 NOTE — Progress Notes (Signed)
   S:    PCP: [name]   No chief complaint on file.  Nathaniel Spears is a 46 y.o. male who presents for hypertension evaluation, education, and management.   PMH is significant for HTN, T2DM, acute CVA, Ischemic stroke (5 strokes last year).  Patient was referred and last seen by Primary Care Provider, [name], on 11/16/2021. I saw him on 11/18/2021 and added amlodipine.  Today, patient arrives in good spirits and presents with his girl friend. Denies dizziness, headache, blurred vision, swelling. No side effects to any medications.  Patient reports hypertension is longstanding.  Family/Social history:  -Fhx: none listed -Tobacco: former smoker  Medication adherence is appropriate. Patient has taken BP medications today.   Current antihypertensives include: amlodipine 5 mg daily, valsartan/HCTZ 320-25 mg twice daily, carvedilol 6.25 mg BID.   Patient reported dietary habits: Eats 2 meals/day -Patient is adherent with salt restrictions. -Caffeine: doesn't drink coffee, drinks 2-3 sodas daily   Patient-reported exercise habits:  -Patient doesn't have a current exercising regimen, but is planning to start working out.  O:  Vitals:   12/30/21 1542  BP: 134/84   Last 3 Office BP readings: BP Readings from Last 3 Encounters:  12/30/21 134/84  11/18/21 (!) 151/101  11/16/21 (!) 140/88   BMET    Component Value Date/Time   NA 140 11/16/2021 1428   K 3.8 11/16/2021 1428   CL 97 11/16/2021 1428   CO2 28 11/16/2021 1428   GLUCOSE 161 (H) 11/16/2021 1428   GLUCOSE 165 (H) 08/25/2021 1240   BUN 16 11/16/2021 1428   CREATININE 1.30 (H) 11/16/2021 1428   CALCIUM 8.9 11/16/2021 1428   GFRNONAA >60 08/25/2021 1240   Renal function: CrCl cannot be calculated ([name] most recent lab result is older than the maximum 21 days allowed.).  Clinical ASCVD: Yes  The ASCVD Risk score (Arnett DK, et al., 2019) failed to calculate for the following reasons:   The patient has a prior  MI or stroke diagnosis  A/P: Hypertension longstanding currently close to goal on current medications. BP goal < 130/80 mmHg. Medication adherence appears appropriate.  -Continue Valsartan/HCTZ 320-25 mg once daily. -Continue Carvedilol 6.25 mg twice daily. -Continue amlodipine 5 mg daily.  -Patient educated on purpose, proper use, and potential adverse effects of amlodipine.  -F/u labs ordered - none -Counseled on lifestyle modifications for blood pressure control including reduced dietary sodium, increased exercise, adequate sleep. -Encouraged patient to check BP at home and bring log of readings to next visit. Counseled on proper use of home BP cuff.    Results reviewed and written information provided.    Written patient instructions provided. Patient verbalized understanding of treatment plan.  Total time in face to face counseling 30 minutes.    Follow-up:  Pharmacist in 4-6 weeks.  Butch Penny, PharmD, P[place]y [place], CPP Clinical Pharmacist [facility] [phone]

## 2022-01-02 ENCOUNTER — Other Ambulatory Visit: Payer: Self-pay

## 2022-01-20 ENCOUNTER — Other Ambulatory Visit: Payer: Self-pay

## 2022-01-22 ENCOUNTER — Ambulatory Visit (HOSPITAL_COMMUNITY)
Admission: EM | Admit: 2022-01-22 | Discharge: 2022-01-22 | Disposition: A | Discharge status: Home / Self Care | Payer: Medicaid Other | Attending: Internal Medicine | Admitting: Internal Medicine

## 2022-01-22 ENCOUNTER — Encounter (HOSPITAL_COMMUNITY): Payer: Self-pay | Admitting: *Deleted

## 2022-01-22 DIAGNOSIS — H1033 Unspecified acute conjunctivitis, bilateral: Secondary | ICD-10-CM | POA: Diagnosis not present

## 2022-01-22 HISTORY — DX: Type 2 diabetes mellitus without complications: E11.9

## 2022-01-22 MED ORDER — GENTAMICIN SULFATE 0.3 % OP SOLN: 2.0000 [drp] | OPHTHALMIC | 0 refills | Status: DC

## 2022-01-22 MED ORDER — TETRACAINE HCL 0.5 % OP SOLN
OPHTHALMIC | Status: AC
  Filled 2022-01-22: qty 4

## 2022-01-22 MED ORDER — EYE WASH OP SOLN
OPHTHALMIC | Status: AC
  Filled 2022-01-22: qty 118

## 2022-01-22 MED ORDER — FLUORESCEIN SODIUM 1 MG OP STRP
ORAL_STRIP | OPHTHALMIC | Status: AC
  Filled 2022-01-22: qty 3

## 2022-01-22 NOTE — ED Triage Notes (Signed)
Per pt and significant other, pt started with left eye pain, redness and irritation approx 2 wks ago; 2 days ago started with right eye pain and irritation also. Denies injury. Has been placing Visine eye gtts in both eyes. Reports tearing to bilat eyes. Denies any cold sxs and congestion.

## 2022-01-22 NOTE — Discharge Instructions (Signed)
You have pinkeye to both eyes.  Placed 2 drops of gentamicin eyedrops into both eyes every 4 hours for the next 7 days to treat pinkeye infection.   Change your pillowcase after 2 to 3 days to avoid reinfection.   You may take Tylenol every 6 hours as needed for any pain you may have.   Avoid scratching your eye.  Wash your hands frequently to avoid spread of infection to others.  Perform warm compresses to your eye before applying the eyedrops.   If you develop any new or worsening symptoms or do not improve in the next 2 to 3 days, please return.  If your symptoms are severe, please go to the emergency room.  Follow-up with your primary care provider for further evaluation and management of your symptoms as well as ongoing wellness visits.  I hope you feel better!

## 2022-01-22 NOTE — ED Provider Notes (Signed)
[facility]    CSN: [id number] Arrival date & time: 01/22/22  1038      History   Chief Complaint Chief Complaint  Patient presents with   Eye Pain    HPI Nathaniel Spears is a 47 y.o. male.   Patient presents urgent care for evaluation of left eye drainage, crusting, and slight discomfort upon waking.  Similar symptoms happened to the right eye approximately 1 week ago but he was able to manage the right eye discomfort and drainage/crusting with over-the-counter eyedrops.  Symptoms to the right eye never fully went away.  Denies itching to the eyes, blurry vision, dizziness, fever/chills, headache, sore throat, ear pain, and recent injury to the eyes.  No known foreign body/scratching to the eyes.  Denies history of allergic conjunctivitis.  No known sick contacts with similar symptoms.  He is a diabetic, states his sugars have been well-controlled recently.  He is not a contact lens user and does not use glasses for vision correction.   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   Acute ischemic stroke (HCC) 06/05/2020   Acute left-sided weakness 06/04/2020   Nicotine dependence 06/04/2020   Cocaine use 06/04/2020   Aphasia    Polysubstance abuse (HCC)    Type 2 diabetes mellitus with hyperglycemia, with long-term current use of insulin (HCC)    Hypokalemia    Acute CVA (cerebrovascular accident) (HCC) 04/20/2020   Essential hypertension 08/21/2019   Obesity 08/21/2019    Past Surgical History:  Procedure Laterality Date   BUBBLE STUDY  06/01/2020   Procedure: BUBBLE STUDY;  Surgeon: Little Ishikawa, MD;  Location: [facility] ENDOSCOPY;  Service: Cardiovascular;;   IR ANGIO INTRA EXTRACRAN SEL COM CAROTID INNOMINATE  BILAT MOD SED  06/02/2020   IR ANGIO VERTEBRAL SEL SUBCLAVIAN INNOMINATE BILAT MOD SED  06/02/2020   TEE WITHOUT CARDIOVERSION N/A 06/01/2020   Procedure: TRANSESOPHAGEAL ECHOCARDIOGRAM (TEE);  Surgeon: Little Ishikawa, MD;  Location: [facility] ENDOSCOPY;  Service: Cardiovascular;  Laterality: N/A;       Home Medications    Prior to Admission medications   Medication Sig Start Date End Date Taking? Authorizing Provider  amLODipine (NORVASC) 5 MG tablet Take 1 tablet (5 mg total) by mouth daily. 11/18/21  Yes Hoy Register, MD  carvedilol (COREG) 6.25 MG tablet Take 1 tablet (6.25 mg total) by mouth 2 (two) times daily with a meal. 10/13/21  Yes Newlin, Enobong, MD  empagliflozin (JARDIANCE) 25 MG TABS tablet Take 1 tablet (25 mg total) by mouth daily before breakfast. 08/10/21  Yes Grayce Sessions, NP  gentamicin (GARAMYCIN) 0.3 % ophthalmic solution Place 2 drops into both eyes every 4 (four) hours. 01/22/22  Yes Carlisle Beers, FNP  insulin detemir (LEVEMIR FLEXTOUCH) 100 UNIT/ML FlexPen Inject 10 Units into the skin at bedtime. 08/10/21  Yes Grayce Sessions, NP  traZODone (DESYREL) 50 MG tablet Take 0.5-1 tablets (25-50 mg total) by mouth at bedtime as needed for sleep. 12/15/21  Yes Grayce Sessions, NP  valsartan-hydrochlorothiazide (DIOVAN-HCT) 320-25 MG tablet Take 1 tablet by mouth daily. 10/13/21  Yes Hoy Register, MD  Accu-Chek Softclix Lancets lancets Use to check blood sugar three times daily. 08/10/21   Newlin,  Enobong, MD  Blood Glucose Monitoring Suppl (ACCU-CHEK GUIDE) w/Device KIT Use to check blood sugar three times daily. 08/10/21   Hoy Register, MD  Blood Pressure Monitoring (BLOOD PRESSURE CUFF) MISC Use to check blood pressure once daily. 10/13/21   Hoy Register, MD  chlorhexidine (PERIDEX) 0.12 % solution Swish 15 milliliters for 30 seconds 2 times per day after brushing teeth, then spit out for 4 days. 12/27/21     clopidogrel (PLAVIX) 75 MG tablet  Take 1 tablet (75 mg total) by mouth once daily. Patient not taking: Reported on 08/10/2021 06/29/21   Horton Chin, MD  erythromycin ophthalmic ointment Place a 1/2 inch ribbon of ointment into the lower eyelid twice daily for 7 days. 08/12/20   Gustavus Bryant, FNP  glucose blood (ACCU-CHEK GUIDE) test strip Use to check blood sugar three times daily. 08/10/21   Hoy Register, MD  Insulin Pen Needle (PENTIPS) 32G X 4 MM MISC Use as directed 08/10/21   Grayce Sessions, NP  loperamide (IMODIUM) 2 MG capsule Take by mouth. Patient not taking: Reported on 08/10/2021 09/22/17   [provider]    Family History Family History  Problem Relation Age of Onset   Stroke Neg Hx     Social History Social History   Tobacco Use   Smoking status: Former    Types: Cigarettes   Smokeless tobacco: Never  Vaping Use   Vaping Use: Never used  Substance Use Topics   Alcohol use: Not Currently   Drug use: Never     Allergies   Milk-related compounds   Review of Systems Review of Systems  Eyes:  Positive for pain.  Per HPI   Physical Exam Triage Vital Signs ED Triage Vitals [01/22/22 1204]  Enc Vitals Group     BP 138/83     Pulse Rate 78     Resp 18     Temp 98.3 F (36.8 C)     Temp Source Oral     SpO2 94 %     Weight      Height      Head Circumference      Peak Flow      Pain Score      Pain Loc      Pain Edu?      Excl. in GC?    No data found.  Updated Vital Signs BP 138/83   Pulse 78   Temp 98.3 F (36.8 C) (Oral)   Resp 18   SpO2 94%   Visual Acuity Right Eye Distance: 20/70 Left Eye Distance: 20/70 Bilateral Distance: 20/50 -1  Right Eye Near:   Left Eye Near:    Bilateral Near:     Physical Exam Vitals and nursing note reviewed.  Constitutional:      Appearance: He is not ill-appearing or toxic-appearing.  HENT:     Head: Normocephalic and atraumatic.     Right Ear: Hearing, tympanic membrane, ear canal and external ear normal.      Left Ear: Hearing, tympanic membrane, ear canal and external ear normal.     Nose: Nose normal.     Mouth/Throat:     Lips: Pink.     Mouth: Mucous membranes are moist.     Pharynx: No posterior oropharyngeal erythema.  Eyes:     General: Lids are normal. Vision grossly intact. Gaze aligned appropriately.        Right eye: No discharge.        Left  eye: Discharge present.    Extraocular Movements: Extraocular movements intact.     Conjunctiva/sclera:     Right eye: Right conjunctiva is not injected.     Left eye: Left conjunctiva is injected.  Cardiovascular:     Rate and Rhythm: Normal rate and regular rhythm.     Heart sounds: Normal heart sounds, S1 normal and S2 normal.  Pulmonary:     Effort: Pulmonary effort is normal. No respiratory distress.     Breath sounds: Normal breath sounds and air entry.  Musculoskeletal:     Cervical back: Neck supple.  Skin:    General: Skin is warm and dry.     Capillary Refill: Capillary refill takes less than 2 seconds.     Findings: No rash.  Neurological:     General: No focal deficit present.     Mental Status: He is alert and oriented to person, place, and time. Mental status is at baseline.     Cranial Nerves: No dysarthria or facial asymmetry.  Psychiatric:        Mood and Affect: Mood normal.        Speech: Speech normal.        Behavior: Behavior normal.        Thought Content: Thought content normal.        Judgment: Judgment normal.      UC Treatments / Results  Labs (all labs ordered are listed, but only abnormal results are displayed) Labs Reviewed - No data to display  EKG   Radiology No results found.  Procedures Procedures (including critical care time)  Medications Ordered in UC Medications - No data to display  Initial Impression / Assessment and Plan / UC Course  I have reviewed the triage vital signs and the nursing notes.  Pertinent labs & imaging results that were available during my care of the  patient were reviewed by me and considered in my medical decision making (see chart for details).  1.  Bacterial conjunctivitis bilaterally  Gentamicin eyedrops prescribed.  Patient to use 2 drops to both eyes every 4 hours for the next 7 days as infection will likely spread to the left eye due to itching and contact with infected material.  Denies allergies to antibiotics.  Advised patient to change pillowcase after 2 to 3 days of antibiotics to avoid reinfection.  Tylenol may be used every 6 hours as needed for pain and discomfort.  Advised patient to avoid scratching the eye and wash hands frequently.  Warm compresses to be used prior to administration of eyedrops to reduce inflammation and encourage drainage of infected material from the eye.  Follow-up with urgent care as needed if symptoms do not improve in the next 24 to 48 hours with antibiotic eyedrops or if patient develops blurry vision/worsening visual acuity.   Discussed physical exam and available lab work findings in clinic with patient.  Counseled patient regarding appropriate use of medications and potential side effects for all medications recommended or prescribed today. Discussed red flag signs and symptoms of worsening condition,when to call the PCP office, return to urgent care, and when to seek higher level of care in the emergency department. Patient verbalizes understanding and agreement with plan. All questions answered. Patient discharged in stable condition.    Final Clinical Impressions(s) / UC Diagnoses   Final diagnoses:  Acute bacterial conjunctivitis of both eyes     Discharge Instructions      You have pinkeye to both eyes.  Placed  2 drops of gentamicin eyedrops into both eyes every 4 hours for the next 7 days to treat pinkeye infection.   Change your pillowcase after 2 to 3 days to avoid reinfection.   You may take Tylenol every 6 hours as needed for any pain you may have.   Avoid scratching your eye.  Wash  your hands frequently to avoid spread of infection to others.  Perform warm compresses to your eye before applying the eyedrops.   If you develop any new or worsening symptoms or do not improve in the next 2 to 3 days, please return.  If your symptoms are severe, please go to the emergency room.  Follow-up with your primary care provider for further evaluation and management of your symptoms as well as ongoing wellness visits.  I hope you feel better!     ED Prescriptions     Medication Sig Dispense Auth. Provider   gentamicin (GARAMYCIN) 0.3 % ophthalmic solution Place 2 drops into both eyes every 4 (four) hours. 5 mL Talbot Grumbling, FNP      PDMP not reviewed this encounter.   Talbot Grumbling, [place] 01/22/22 1248

## 2022-01-30 ENCOUNTER — Other Ambulatory Visit: Payer: Self-pay

## 2022-02-01 ENCOUNTER — Other Ambulatory Visit: Payer: Self-pay

## 2022-02-27 ENCOUNTER — Other Ambulatory Visit: Payer: Self-pay | Admitting: Family Medicine

## 2022-02-27 ENCOUNTER — Other Ambulatory Visit: Payer: Self-pay

## 2022-02-27 DIAGNOSIS — I10 Essential (primary) hypertension: Secondary | ICD-10-CM

## 2022-02-28 ENCOUNTER — Other Ambulatory Visit: Payer: Self-pay

## 2022-02-28 MED ORDER — ACCU-CHEK GUIDE VI STRP
ORAL_STRIP | 2 refills | Status: DC
  Filled 2022-02-28: qty 100, 33d supply, fill #0
  Filled 2022-04-25: qty 100, 33d supply, fill #1
  Filled 2022-06-16: qty 100, 33d supply, fill #2

## 2022-02-28 MED ORDER — CARVEDILOL 6.25 MG PO TABS
6.2500 mg | ORAL_TABLET | Freq: Two times a day (BID) | ORAL | 1 refills | Status: DC
  Filled 2022-02-28: qty 180, 90d supply, fill #0
  Filled 2022-06-13: qty 180, 90d supply, fill #1

## 2022-02-28 NOTE — Telephone Encounter (Signed)
Requested Prescriptions  Pending Prescriptions Disp Refills   glucose blood (ACCU-CHEK GUIDE) test strip 100 each 2    Sig: Use to check blood sugar three times daily.     Endocrinology: Diabetes - Testing Supplies Passed - 02/27/2022 11:23 AM      Passed - Valid encounter within last 12 months    Recent Outpatient Visits           2 months ago Essential hypertension   [place], Jarome Matin, RPH-CPP   3 months ago Essential hypertension   [place], Jarome Matin, RPH-CPP   3 months ago Essential hypertension   [place], Michelle P, NP   4 months ago Essential hypertension   [place], Jarome Matin, RPH-CPP   5 months ago Essential hypertension   [place] [facility] Kerin Perna, NP               carvedilol (COREG) 6.25 MG tablet 180 tablet 1    Sig: Take 1 tablet (6.25 mg total) by mouth 2 (two) times daily with a meal.     Cardiovascular: Beta Blockers 3 Failed - 02/27/2022 11:23 AM      Failed - Cr in normal range and within 360 days    Creatinine, Ser  Date Value Ref Range Status  11/16/2021 1.30 (H) 0.76 - 1.27 mg/dL Final         Passed - AST in normal range and within 360 days    AST  Date Value Ref Range Status  11/16/2021 17 0 - 40 IU/L Final         Passed - ALT in normal range and within 360 days    ALT  Date Value Ref Range Status  11/16/2021 15 0 - 44 IU/L Final         Passed - Last BP in normal range    BP Readings from Last 1 Encounters:  01/22/22 138/83         Passed - Last Heart Rate in normal range    Pulse Readings from Last 1 Encounters:  01/22/22 78         Passed - Valid encounter within last 6 months    Recent Outpatient Visits           2 months ago Essential hypertension   [place], Jarome Matin, RPH-CPP   3 months ago Essential hypertension   [place], Jarome Matin, RPH-CPP   3 months ago Essential hypertension   [place], Michelle P, NP   4 months ago Essential hypertension   [place], Jarome Matin, RPH-CPP   5 months ago Essential hypertension   [place], Michelle P, NP

## 2022-03-02 ENCOUNTER — Other Ambulatory Visit: Payer: Self-pay

## 2022-03-03 ENCOUNTER — Other Ambulatory Visit: Payer: Self-pay

## 2022-03-15 ENCOUNTER — Other Ambulatory Visit (INDEPENDENT_AMBULATORY_CARE_PROVIDER_SITE_OTHER): Payer: Self-pay | Admitting: Primary Care

## 2022-03-15 ENCOUNTER — Other Ambulatory Visit: Payer: Self-pay

## 2022-03-16 ENCOUNTER — Other Ambulatory Visit: Payer: Self-pay

## 2022-03-16 MED ORDER — EMPAGLIFLOZIN 25 MG PO TABS
25.0000 mg | ORAL_TABLET | Freq: Every day | ORAL | 0 refills | Status: DC
  Filled 2022-03-16: qty 90, 90d supply, fill #0

## 2022-03-16 NOTE — Telephone Encounter (Signed)
Requested Prescriptions  Pending Prescriptions Disp Refills   empagliflozin (JARDIANCE) 25 MG TABS tablet 90 tablet 0    Sig: Take 1 tablet (25 mg total) by mouth daily before breakfast.     Endocrinology:  Diabetes - SGLT2 Inhibitors Failed - 03/15/2022  3:25 PM      Failed - Cr in normal range and within 360 days    Creatinine, Ser  Date Value Ref Range Status  11/16/2021 1.30 (H) 0.76 - 1.27 mg/dL Final         Failed - HBA1C is between 0 and 7.9 and within 180 days    Hemoglobin A1C  Date Value Ref Range Status  08/25/2021 13.9 (A) 4.0 - 5.6 % Final   Hgb A1c MFr Bld  Date Value Ref Range Status  05/31/2020 10.3 (H) 4.8 - 5.6 % Final    Comment:    (NOTE) Pre diabetes:          5.7%-6.4%  Diabetes:              >6.4%  Glycemic control for   <7.0% adults with diabetes          Passed - eGFR in normal range and within 360 days    GFR, Estimated  Date Value Ref Range Status  08/25/2021 >60 >60 mL/min Final    Comment:    (NOTE) Calculated using the CKD-EPI Creatinine Equation (2021)    eGFR  Date Value Ref Range Status  11/16/2021 69 >59 mL/min/1.73 Final         Passed - Valid encounter within last 6 months    Recent Outpatient Visits           2 months ago Essential hypertension   [place], Jarome Matin, RPH-CPP   3 months ago Essential hypertension   [place], Jarome Matin, RPH-CPP   4 months ago Essential hypertension   [place], Michelle P, NP   5 months ago Essential hypertension   [place], Jarome Matin, RPH-CPP   5 months ago Essential hypertension   [place], Michelle P, NP

## 2022-03-17 ENCOUNTER — Other Ambulatory Visit: Payer: Self-pay

## 2022-03-20 ENCOUNTER — Ambulatory Visit (INDEPENDENT_AMBULATORY_CARE_PROVIDER_SITE_OTHER): Payer: Medicaid Other | Admitting: Primary Care

## 2022-03-20 ENCOUNTER — Encounter (INDEPENDENT_AMBULATORY_CARE_PROVIDER_SITE_OTHER): Payer: Self-pay | Admitting: Primary Care

## 2022-03-20 VITALS — BP 136/91 | HR 77 | Resp 16 | Ht 73.0 in | Wt 269.4 lb

## 2022-03-20 DIAGNOSIS — Z794 Long term (current) use of insulin: Secondary | ICD-10-CM

## 2022-03-20 DIAGNOSIS — R413 Other amnesia: Secondary | ICD-10-CM

## 2022-03-20 DIAGNOSIS — I639 Cerebral infarction, unspecified: Secondary | ICD-10-CM | POA: Diagnosis not present

## 2022-03-20 DIAGNOSIS — E1165 Type 2 diabetes mellitus with hyperglycemia: Secondary | ICD-10-CM | POA: Diagnosis not present

## 2022-03-20 LAB — POCT GLYCOSYLATED HEMOGLOBIN (HGB A1C): HbA1c, POC (controlled diabetic range): 10.4 % — AB (ref 0.0–7.0)

## 2022-03-20 NOTE — Progress Notes (Signed)
[place], is a 47 y.o. male  [id number]  [id number]  DOB - [birth year]  Chief Complaint  Patient presents with   Hypertension   Diabetes       Subjective:   Nathaniel Spears is a 47 y.o. male here today for a follow up visit. Patient has No headache, No chest pain, No abdominal pain - No Nausea, No new weakness tingling or numbness, No Cough - shortness of breath  No problems updated.  Allergies  Allergen Reactions   Milk-Related Compounds     Past Medical History:  Diagnosis Date   Diabetes mellitus without complication ([place])    Hypertension    Stroke [facility])     Current Outpatient Medications on File Prior to Visit  Medication Sig Dispense Refill   Accu-Chek Softclix Lancets lancets Use to check blood sugar three times daily. 100 each 2   amLODipine (NORVASC) 5 MG tablet Take 1 tablet (5 mg total) by mouth daily. 90 tablet 1   Blood Glucose Monitoring Suppl (ACCU-CHEK GUIDE) w/Device KIT Use to check blood sugar three times daily. 1 kit 0   Blood Pressure Monitoring (BLOOD PRESSURE CUFF) MISC Use to check blood pressure once daily. 1 each 0   carvedilol (COREG) 6.25 MG tablet Take 1 tablet (6.25 mg total) by mouth 2 (two) times daily with a meal. 180 tablet 1   chlorhexidine (PERIDEX) 0.12 % solution Swish 15 milliliters for 30 seconds 2 times per day after brushing teeth, then spit out for 4 days. 473 mL 0   clopidogrel (PLAVIX) 75 MG tablet Take 1 tablet (75 mg total) by mouth once daily. (Patient not taking: Reported on 08/10/2021) 30 tablet 3   empagliflozin (JARDIANCE) 25 MG TABS tablet Take 1 tablet (25 mg total) by mouth daily before breakfast. 90 tablet 0   erythromycin ophthalmic ointment Place a 1/2 inch ribbon of ointment into the lower eyelid twice daily for 7 days. 3.5 g 0   gentamicin (GARAMYCIN) 0.3 % ophthalmic solution Place 2 drops into both eyes every 4 (four) hours. 5 mL 0   glucose blood (ACCU-CHEK GUIDE) test  strip Use to check blood sugar three times daily. 100 each 2   insulin detemir (LEVEMIR FLEXTOUCH) 100 UNIT/ML FlexPen Inject 10 Units into the skin at bedtime. 15 mL 11   Insulin Pen Needle (PENTIPS) 32G X 4 MM MISC Use as directed 100 each 1   loperamide (IMODIUM) 2 MG capsule Take by mouth. (Patient not taking: Reported on 08/10/2021)     traZODone (DESYREL) 50 MG tablet Take 0.5-1 tablets (25-50 mg total) by mouth at bedtime as needed for sleep. 30 tablet 3   valsartan-hydrochlorothiazide (DIOVAN-HCT) 320-25 MG tablet Take 1 tablet by mouth daily. 90 tablet 3   No current facility-administered medications on file prior to visit.    Objective:   Vitals:   03/20/22 1615 03/20/22 1616  BP: (Abnormal) 146/95 (Abnormal) 136/91  Pulse: 77   Resp: 16   SpO2: 95%   Weight: 269 lb 6.4 oz (122.2 kg)   Height: '6\' 1"'$  (1.854 m)     Comprehensive ROS Pertinent positive and negative noted in HPI   Exam General appearance : Awake, alert, not in any distress. Speech Clear. Not toxic looking HEENT: Atraumatic and Normocephalic, pupils equally reactive to light and accomodation Neck: Supple, no JVD. No cervical lymphadenopathy.  Chest: Good air entry bilaterally, no added sounds  CVS: S1 S2 regular, no murmurs.  Abdomen: Bowel sounds present,  Non tender and not distended with no gaurding, rigidity or rebound. Extremities: B/L Lower Ext shows no edema, both legs are warm to touch Neurology: Awake alert, and oriented X 3, CN II-XII intact, Non focal Skin: No Rash  Data Review Lab Results  Component Value Date   HGBA1C 10.4 (A) 03/20/2022   HGBA1C 13.9 (A) 08/25/2021   HGBA1C 10.3 (H) 05/31/2020    Assessment & [place] was seen today for hypertension and diabetes.  Diagnoses and all orders for this visit:  Type 2 diabetes mellitus with hyperglycemia, with long-term current use of insulin (HCC) -     POCT glycosylated hemoglobin (Hb A1C) -     Microalbumin / creatinine urine  ratio  Cerebrovascular accident (CVA), unspecified mechanism ([place]) 2/2 Memory loss -     Ambulatory referral to Neurology   Patient have been counseled extensively about nutrition and exercise. Other issues discussed during this visit include: low cholesterol diet, weight control and daily exercise, foot care, annual eye examinations at Ophthalmology, importance of adherence with medications and regular follow-up. We also discussed long term complications of uncontrolled diabetes and hypertension.   Return in about 3 months (around 06/20/2022) for DM.  The patient was given clear instructions to go to ER or return to medical center if symptoms don't improve, worsen or new problems develop. The patient verbalized understanding. The patient was told to call to get lab results if they haven't heard anything in the next week.   This note has been created with Surveyor, quantity. Any transcriptional errors are unintentional.   Kerin Perna, NP 03/20/2022, 9:33 PM

## 2022-03-29 ENCOUNTER — Encounter (INDEPENDENT_AMBULATORY_CARE_PROVIDER_SITE_OTHER): Payer: Self-pay

## 2022-03-31 ENCOUNTER — Other Ambulatory Visit: Payer: Self-pay

## 2022-04-12 DIAGNOSIS — Z6835 Body mass index (BMI) 35.0-35.9, adult: Secondary | ICD-10-CM | POA: Diagnosis not present

## 2022-04-12 DIAGNOSIS — M542 Cervicalgia: Secondary | ICD-10-CM | POA: Diagnosis not present

## 2022-04-17 ENCOUNTER — Other Ambulatory Visit: Payer: Self-pay

## 2022-04-19 ENCOUNTER — Other Ambulatory Visit: Payer: Self-pay

## 2022-04-25 ENCOUNTER — Ambulatory Visit: Payer: Medicaid Other | Attending: Family Medicine | Admitting: Pharmacist

## 2022-04-25 ENCOUNTER — Other Ambulatory Visit (INDEPENDENT_AMBULATORY_CARE_PROVIDER_SITE_OTHER): Payer: Self-pay | Admitting: Primary Care

## 2022-04-25 ENCOUNTER — Other Ambulatory Visit: Payer: Self-pay

## 2022-04-25 VITALS — BP 136/93 | HR 79

## 2022-04-25 DIAGNOSIS — I10 Essential (primary) hypertension: Secondary | ICD-10-CM

## 2022-04-25 DIAGNOSIS — F32A Depression, unspecified: Secondary | ICD-10-CM

## 2022-04-25 MED ORDER — AMLODIPINE BESYLATE 10 MG PO TABS
10.0000 mg | ORAL_TABLET | Freq: Every day | ORAL | 1 refills | Status: DC
  Filled 2022-04-25: qty 90, 90d supply, fill #0
  Filled 2022-08-15: qty 90, 90d supply, fill #1

## 2022-04-25 MED ORDER — PENTIPS 32G X 4 MM MISC
1 refills | Status: DC
  Filled 2022-04-25: qty 100, 90d supply, fill #0
  Filled 2023-01-18: qty 100, 90d supply, fill #1

## 2022-04-25 MED ORDER — LANTUS SOLOSTAR 100 UNIT/ML ~~LOC~~ SOPN
10.0000 [IU] | PEN_INJECTOR | Freq: Every day | SUBCUTANEOUS | 1 refills | Status: DC
  Filled 2022-04-25: qty 9, 90d supply, fill #0

## 2022-04-25 NOTE — Progress Notes (Signed)
   S:    PCP: Gwinda Passe, NP  No chief complaint on file.  Nathaniel Spears is a 47 y.o. male who presents for hypertension evaluation, education, and management.   PMH is significant for HTN, T2DM, acute CVA, Ischemic stroke (hx multiple strokes).  Patient was referred by Primary Care Provider, Gwinda Passe, on 11/16/2021. Last seen by pharmacy 12/30/2021. Since then has seen Primary Care Provider on 03/20/2022. BP at that visit was 136/91. No medication changes were made at that visit.   Today, patient arrives in good spirits. Denies dizziness, headache, blurred vision, swelling. No side effects to any medications.  Patient reports hypertension and diabetes are longstanding.  Family/Social history:  -Fhx: none listed -Tobacco: former smoker  Medication adherence is appropriate. Patient has taken BP medications today.   Current antihypertensives include: amlodipine 5 mg daily, valsartan/HCTZ 320-25 mg daily, carvedilol 6.25 mg BID.   Patient reported dietary habits: Eats 2 meals/day  -Patient is adherent with salt restrictions. -Caffeine: doesn't drink coffee  Patient-reported exercise habits:  -Patient doesn't have a current exercising regimen, but is planning to start working out.  O:  Vitals:   04/25/22 1556  BP: (!) 136/93  Pulse: 79    Last 3 Office BP readings: BP Readings from Last 3 Encounters:  04/25/22 (!) 136/93  03/20/22 (!) 136/91  01/22/22 138/83   BMET    Component Value Date/Time   NA 140 11/16/2021 1428   K 3.8 11/16/2021 1428   CL 97 11/16/2021 1428   CO2 28 11/16/2021 1428   GLUCOSE 161 (H) 11/16/2021 1428   GLUCOSE 165 (H) 08/25/2021 1240   BUN 16 11/16/2021 1428   CREATININE 1.30 (H) 11/16/2021 1428   CALCIUM 8.9 11/16/2021 1428   GFRNONAA >60 08/25/2021 1240   Renal function: CrCl cannot be calculated ([name] most recent lab result is older than the maximum 21 days allowed.).  Clinical ASCVD: Yes  The ASCVD Risk score (Arnett  DK, et al., 2019) failed to calculate for the following reasons:   The patient has a prior MI or stroke diagnosis  A/P: Hypertension longstanding currently close to goal on current medications. BP goal < 130/80 mmHg. Medication adherence appears appropriate.  -Continued Valsartan/HCTZ 320-25 mg once daily. -Continued Carvedilol 6.25 mg twice daily. -Increased amlodipine 10 mg daily.  -Patient educated on purpose, proper use, and potential adverse effects of amlodipine.  -F/u labs ordered - none -Counseled on lifestyle modifications for blood pressure control including reduced dietary sodium, increased exercise, adequate sleep. -Encouraged patient to check BP at home and bring log of readings to next visit. Counseled on proper use of home BP cuff.   Diabetes - Changed Levemir to Lantus 10 units daily.   Results reviewed and written information provided.    Written patient instructions provided. Patient verbalized understanding of treatment plan.  Total time in face to face counseling 30 minutes.    Follow-up:  Pharmacist in 4 weeks. Primary Care Physician on 06/20/2022  Georga Hacking, Pharm.D PGY1 Pharmacy Resident 04/25/2022 4:44 PM

## 2022-04-26 ENCOUNTER — Other Ambulatory Visit: Payer: Self-pay

## 2022-04-26 MED ORDER — TRAZODONE HCL 50 MG PO TABS
25.0000 mg | ORAL_TABLET | Freq: Every evening | ORAL | 0 refills | Status: DC | PRN
  Filled 2022-04-26 – 2022-11-08 (×2): qty 90, 90d supply, fill #0

## 2022-04-26 NOTE — Telephone Encounter (Signed)
Requested Prescriptions  Pending Prescriptions Disp Refills   traZODone (DESYREL) 50 MG tablet 90 tablet 0    Sig: Take 0.5-1 tablets (25-50 mg total) by mouth at bedtime as needed for sleep.     Psychiatry: Antidepressants - Serotonin Modulator Passed - 04/25/2022  8:35 AM      Passed - Valid encounter within last 6 months    Recent Outpatient Visits           Yesterday Essential hypertension   [place] [facility] [place], [place] L, RPH-CPP   1 month ago Type 2 diabetes mellitus with hyperglycemia, with long-term current use of insulin (HCC)   [place] [facility] Grayce Sessions, NP   3 months ago Essential hypertension   [place] [facility] [place], Cornelius Moras, RPH-CPP   5 months ago Essential hypertension   [facility] [place], Cornelius Moras, RPH-CPP   5 months ago Essential hypertension   [place] [facility] Grayce Sessions, NP       Future Appointments             In 1 month Lois Huxley, Cornelius Moras, RPH-CPP [facility]   In 1 month Randa Evens, Kinnie Scales, NP [place] [facility]

## 2022-05-03 ENCOUNTER — Other Ambulatory Visit: Payer: Self-pay

## 2022-05-08 ENCOUNTER — Telehealth: Payer: Self-pay | Admitting: Pharmacist

## 2022-05-08 NOTE — Progress Notes (Signed)
Patient attempted to be outreached by Natalie Elchert, PharmD Candidate on 05/08/2022 to discuss hypertension. Left voicemail for patient to return our call at their convenience at [phone].   Natalie Elchert, PharmD Candidate   Catie T. Laurelle Skiver, PharmD, BCACP, CPP [facility] [phone]  

## 2022-05-26 ENCOUNTER — Other Ambulatory Visit: Payer: Self-pay

## 2022-05-26 ENCOUNTER — Ambulatory Visit: Payer: Medicaid Other | Attending: Family Medicine | Admitting: Pharmacist

## 2022-05-26 ENCOUNTER — Encounter: Payer: Self-pay | Admitting: Pharmacist

## 2022-05-26 VITALS — BP 115/78 | HR 82

## 2022-05-26 DIAGNOSIS — I10 Essential (primary) hypertension: Secondary | ICD-10-CM | POA: Diagnosis not present

## 2022-05-26 MED ORDER — LANTUS SOLOSTAR 100 UNIT/ML ~~LOC~~ SOPN
32.0000 [IU] | PEN_INJECTOR | Freq: Every day | SUBCUTANEOUS | 1 refills | Status: DC
  Filled 2022-05-26 – 2022-05-29 (×2): qty 15, 46d supply, fill #0
  Filled 2022-07-07 – 2022-07-11 (×2): qty 15, 46d supply, fill #1

## 2022-05-26 NOTE — Progress Notes (Signed)
   S:    PCP: Gwinda Passe, NP  No chief complaint on file.  Nathaniel Spears is a 47 y.o. male who presents for hypertension evaluation, education, and management.   PMH is significant for HTN, T2DM, acute CVA, Ischemic stroke (hx multiple strokes).  Patient was referred by Primary Care Provider, Gwinda Passe, on 03/20/2022. Pharmacy saw him on 04/25/2022 and increased amlodipine dose.   Today, patient arrives in good spirits. Denies dizziness, headache, blurred vision, swelling. No side effects to any medications.  Patient reports hypertension and diabetes are longstanding.  Family/Social history:  -Fhx: none listed -Tobacco: former smoker  Medication adherence is appropriate. Patient has taken BP medications today.   Current antihypertensives include: amlodipine 10 mg daily, valsartan/HCTZ 320-25 mg daily, carvedilol 6.25 mg BID.   Patient reported dietary habits: Eats 2 meals/day  -Patient is adherent with salt restrictions. -Caffeine: doesn't drink coffee  Patient-reported exercise habits:  -Patient doesn't have a current exercising regimen, but is planning to start working out.  O:  Vitals:   05/26/22 1457  BP: 115/78  Pulse: 82    Last 3 Office BP readings: BP Readings from Last 3 Encounters:  05/26/22 115/78  04/25/22 (!) 136/93  03/20/22 (!) 136/91   BMET    Component Value Date/Time   NA 140 11/16/2021 1428   K 3.8 11/16/2021 1428   CL 97 11/16/2021 1428   CO2 28 11/16/2021 1428   GLUCOSE 161 (H) 11/16/2021 1428   GLUCOSE 165 (H) 08/25/2021 1240   BUN 16 11/16/2021 1428   CREATININE 1.30 (H) 11/16/2021 1428   CALCIUM 8.9 11/16/2021 1428   GFRNONAA >60 08/25/2021 1240   Renal function: CrCl cannot be calculated ([name] most recent lab result is older than the maximum 21 days allowed.).  Clinical ASCVD: Yes  The ASCVD Risk score (Arnett DK, et al., 2019) failed to calculate for the following reasons:   The patient has a prior MI or stroke  diagnosis  A/P: Hypertension longstanding currently at goal on current medications. BP goal < 130/80 mmHg. Medication adherence appears appropriate.  -Continued Valsartan/HCTZ 320-25 mg once daily. -Continued Carvedilol 6.25 mg twice daily. -Continued amlodipine 10 mg daily.  -Patient educated on purpose, proper use, and potential adverse effects of amlodipine.  -F/u labs ordered - none -Counseled on lifestyle modifications for blood pressure control including reduced dietary sodium, increased exercise, adequate sleep. -Encouraged patient to check BP at home and bring log of readings to next visit. Counseled on proper use of home BP cuff.    Results reviewed and written information provided.    Written patient instructions provided. Patient verbalized understanding of treatment plan.  Total time in face to face counseling 30 minutes.    Follow-up:  Pharmacist prn Primary Care Physician on 06/20/2022  Butch Penny, PharmD, [place], CPP Clinical Pharmacist [facility] [phone]

## 2022-05-29 ENCOUNTER — Other Ambulatory Visit: Payer: Self-pay

## 2022-06-13 ENCOUNTER — Other Ambulatory Visit: Payer: Self-pay

## 2022-06-16 ENCOUNTER — Other Ambulatory Visit: Payer: Self-pay

## 2022-06-19 ENCOUNTER — Telehealth: Payer: Self-pay

## 2022-06-19 NOTE — Telephone Encounter (Signed)
Patient attempted to be outreached by Luian Schumpert on 06/19/22 to discuss hypertension. Left voicemail for patient to return our call at their convenience at [phone].  Tabb Croghan, Student-PharmD 

## 2022-06-19 NOTE — Telephone Encounter (Signed)
Patient attempted to be outreached by Mack Guise on 06/19/22 to discuss hypertension. Left voicemail for patient to return our call at their convenience at [phone].  The phone number on file doesn't work. Tried phoning mother and significant other as well. Mack Guise, Student-PharmD

## 2022-06-20 ENCOUNTER — Encounter (INDEPENDENT_AMBULATORY_CARE_PROVIDER_SITE_OTHER): Payer: Self-pay | Admitting: Primary Care

## 2022-06-20 ENCOUNTER — Ambulatory Visit (INDEPENDENT_AMBULATORY_CARE_PROVIDER_SITE_OTHER): Payer: Medicaid Other | Admitting: Primary Care

## 2022-06-20 VITALS — BP 121/83 | HR 83 | Resp 16 | Wt 265.2 lb

## 2022-06-20 DIAGNOSIS — Z09 Encounter for follow-up examination after completed treatment for conditions other than malignant neoplasm: Secondary | ICD-10-CM | POA: Diagnosis not present

## 2022-06-20 DIAGNOSIS — Z0189 Encounter for other specified special examinations: Secondary | ICD-10-CM

## 2022-06-20 DIAGNOSIS — Z1211 Encounter for screening for malignant neoplasm of colon: Secondary | ICD-10-CM

## 2022-06-20 DIAGNOSIS — L602 Onychogryphosis: Secondary | ICD-10-CM

## 2022-06-20 DIAGNOSIS — E1165 Type 2 diabetes mellitus with hyperglycemia: Secondary | ICD-10-CM

## 2022-06-20 DIAGNOSIS — E119 Type 2 diabetes mellitus without complications: Secondary | ICD-10-CM

## 2022-06-20 DIAGNOSIS — I639 Cerebral infarction, unspecified: Secondary | ICD-10-CM | POA: Diagnosis not present

## 2022-06-20 DIAGNOSIS — Z794 Long term (current) use of insulin: Secondary | ICD-10-CM

## 2022-06-20 DIAGNOSIS — R4701 Aphasia: Secondary | ICD-10-CM

## 2022-06-20 LAB — POCT GLYCOSYLATED HEMOGLOBIN (HGB A1C): HbA1c, POC (controlled diabetic range): 8.8 % — AB (ref 0.0–7.0)

## 2022-06-20 NOTE — Progress Notes (Signed)
[facility]  Nathaniel Spears, is a 47 y.o. male  [id number]  [id number]  DOB - 12/27/1975  Chief Complaint  Patient presents with   Diabetes   Hypertension   Rash       Subjective:   [name] is a 47 y.o. male here today for a follow up visit HTN-.  Blood pressure is unremarkable.  Patient has No headache, No chest pain, No abdominal pain - No Nausea, No new weakness tingling or numbness, No Cough - shortness of breath.  He denies polyuria, polyphagia, polydipsia, or vision changes.  No problems updated.  Allergies  Allergen Reactions   Milk-Related Compounds     Past Medical History:  Diagnosis Date   Diabetes mellitus without complication (HCC)    Hypertension    Stroke [facility])     Current Outpatient Medications on File Prior to Visit  Medication Sig Dispense Refill   Accu-Chek Softclix Lancets lancets Use to check blood sugar three times daily. 100 each 2   amLODipine (NORVASC) 10 MG tablet Take 1 tablet (10 mg total) by mouth daily. 90 tablet 1   Blood Glucose Monitoring Suppl (ACCU-CHEK GUIDE) w/Device KIT Use to check blood sugar three times daily. 1 kit 0   Blood Pressure Monitoring (BLOOD PRESSURE CUFF) MISC Use to check blood pressure once daily. 1 each 0   carvedilol (COREG) 6.25 MG tablet Take 1 tablet (6.25 mg total) by mouth 2 (two) times daily with a meal. 180 tablet 1   chlorhexidine (PERIDEX) 0.12 % solution Swish 15 milliliters for 30 seconds 2 times per day after brushing teeth, then spit out for 4 days. 473 mL 0   clopidogrel (PLAVIX) 75 MG tablet Take 1 tablet (75 mg total) by mouth once daily. (Patient not taking: Reported on 08/10/2021) 30 tablet 3   empagliflozin (JARDIANCE) 25 MG TABS tablet Take 1 tablet (25 mg total) by mouth daily before breakfast. 90 tablet 0   erythromycin ophthalmic ointment Place a 1/2 inch ribbon of ointment into the lower eyelid twice daily for 7 days. 3.5 g 0   gentamicin (GARAMYCIN) 0.3 %  ophthalmic solution Place 2 drops into both eyes every 4 (four) hours. 5 mL 0   glucose blood (ACCU-CHEK GUIDE) test strip Use to check blood sugar three times daily. 100 each 2   insulin glargine (LANTUS SOLOSTAR) 100 UNIT/ML Solostar Pen Inject 32 Units into the skin daily. 15 mL 1   Insulin Pen Needle (PENTIPS) 32G X 4 MM MISC Use as directed 100 each 1   loperamide (IMODIUM) 2 MG capsule Take by mouth. (Patient not taking: Reported on 08/10/2021)     traZODone (DESYREL) 50 MG tablet Take 0.5-1 tablets (25-50 mg total) by mouth at bedtime as needed for sleep. 90 tablet 0   valsartan-hydrochlorothiazide (DIOVAN-HCT) 320-25 MG tablet Take 1 tablet by mouth daily. 90 tablet 3   No current facility-administered medications on file prior to visit.    Objective:   Vitals:   06/20/22 1611 06/20/22 1612  BP: 131/89 121/83  Pulse: 83   Resp: 16   SpO2: 100%   Weight: 265 lb 3.2 oz (120.3 kg)     Comprehensive ROS Pertinent positive and negative noted in HPI   Exam General appearance : Awake, alert, not in any distress. Speech Clear. Not toxic looking HEENT: Atraumatic and Normocephalic, pupils equally reactive to light and accomodation Neck: Supple, no JVD. No cervical lymphadenopathy.  Chest: Good air entry bilaterally, no added sounds  CVS: S1 S2 regular, no murmurs.  Abdomen: Bowel sounds present, Non tender and not distended with no gaurding, rigidity or rebound. Extremities: B/L Lower Ext shows no edema, both legs are warm to touch Neurology: Awake alert, and oriented X 3, CN II-XII intact, Non focal Skin: No Rash  Data Review Lab Results  Component Value Date   HGBA1C 8.8 (A) 06/20/2022   HGBA1C 10.4 (A) 03/20/2022   HGBA1C 13.9 (A) 08/25/2021    Assessment & Plan  [place] was seen today for diabetes, hypertension and rash.  Diagnoses and all orders for this visit:  Type 2 diabetes mellitus with hyperglycemia, with long-term current use of insulin (HCC) - educated on  lifestyle modifications, including but not limited to diet choices and adding exercise to daily routine.   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 Other issues discussed during this visit include: low cholesterol diet, weight control and daily exercise, foot care, annual eye examinations at Ophthalmology, importance of adherence with medications and regular follow-up. We also discussed long term complications of uncontrolled diabetes and hypertension.  The patient was given clear instructions to go to ER or return to medical center if symptoms don't improve, worsen or new problems develop. The patient verbalized understanding. The patient was told to call to get lab results if they haven't heard anything in the next week.   This note has been created with Education officer, environmental. Any transcriptional errors are unintentional.   Grayce Sessions, NP 06/20/2022, 4:48 PM

## 2022-06-21 LAB — MICROALBUMIN / CREATININE URINE RATIO
Creatinine, Urine: 114.1 mg/dL
Microalb/Creat Ratio: 6 mg/g creat (ref 0–29)
Microalbumin, Urine: 7.4 ug/mL

## 2022-06-23 ENCOUNTER — Other Ambulatory Visit: Payer: Self-pay

## 2022-06-23 ENCOUNTER — Other Ambulatory Visit (INDEPENDENT_AMBULATORY_CARE_PROVIDER_SITE_OTHER): Payer: Self-pay | Admitting: Primary Care

## 2022-06-23 MED ORDER — EMPAGLIFLOZIN 25 MG PO TABS
25.0000 mg | ORAL_TABLET | Freq: Every day | ORAL | 1 refills | Status: DC
  Filled 2022-06-23: qty 90, 90d supply, fill #0
  Filled 2022-09-22: qty 90, 90d supply, fill #1

## 2022-06-23 NOTE — Telephone Encounter (Signed)
Requested Prescriptions  Pending Prescriptions Disp Refills   empagliflozin (JARDIANCE) 25 MG TABS tablet 90 tablet 1    Sig: Take 1 tablet (25 mg total) by mouth daily before breakfast.     Endocrinology:  Diabetes - SGLT2 Inhibitors Failed - 06/23/2022  3:32 PM      Failed - Cr in normal range and within 360 days    Creatinine, Ser  Date Value Ref Range Status  11/16/2021 1.30 (H) 0.76 - 1.27 mg/dL Final         Failed - HBA1C is between 0 and 7.9 and within 180 days    HbA1c, POC (controlled diabetic range)  Date Value Ref Range Status  06/20/2022 8.8 (A) 0.0 - 7.0 % Final         Passed - eGFR in normal range and within 360 days    GFR, Estimated  Date Value Ref Range Status  08/25/2021 >60 >60 mL/min Final    Comment:    (NOTE) Calculated using the CKD-EPI Creatinine Equation (2021)    eGFR  Date Value Ref Range Status  11/16/2021 69 >59 mL/min/1.73 Final         Passed - Valid encounter within last 6 months    Recent Outpatient Visits           3 days ago Type 2 diabetes mellitus with hyperglycemia, with long-term current use of insulin (HCC)   [place] [facility] Grayce Sessions, NP   4 weeks ago Essential hypertension   [place] [facility] [place], Cornelius Moras, RPH-CPP   1 month ago Essential hypertension   [place] [facility] [place], [place] L, RPH-CPP   3 months ago Type 2 diabetes mellitus with hyperglycemia, with long-term current use of insulin [facility])   [place] [facility] Grayce Sessions, NP   5 months ago Essential hypertension   [place] [facility] [place], Cornelius Moras, RPH-CPP       Future Appointments             In 2 months Randa Evens, Kinnie Scales, NP [place] [facility]

## 2022-06-27 ENCOUNTER — Other Ambulatory Visit: Payer: Self-pay

## 2022-07-07 ENCOUNTER — Other Ambulatory Visit: Payer: Self-pay

## 2022-07-11 ENCOUNTER — Encounter: Payer: Self-pay | Admitting: Nurse Practitioner

## 2022-07-11 ENCOUNTER — Other Ambulatory Visit: Payer: Self-pay

## 2022-07-13 ENCOUNTER — Other Ambulatory Visit: Payer: Self-pay | Admitting: Primary Care

## 2022-07-13 ENCOUNTER — Other Ambulatory Visit: Payer: Self-pay

## 2022-07-14 ENCOUNTER — Other Ambulatory Visit: Payer: Self-pay

## 2022-07-14 MED ORDER — ACCU-CHEK GUIDE VI STRP
ORAL_STRIP | 2 refills | Status: DC
  Filled 2022-07-14 – 2022-08-08 (×2): qty 100, 33d supply, fill #0
  Filled 2022-10-04: qty 100, 33d supply, fill #1
  Filled 2022-11-10: qty 100, 33d supply, fill #2

## 2022-07-17 ENCOUNTER — Encounter: Payer: Self-pay | Admitting: Podiatry

## 2022-07-17 ENCOUNTER — Ambulatory Visit (INDEPENDENT_AMBULATORY_CARE_PROVIDER_SITE_OTHER): Payer: Medicaid Other | Admitting: Podiatry

## 2022-07-17 VITALS — BP 130/79 | HR 74

## 2022-07-17 DIAGNOSIS — E1140 Type 2 diabetes mellitus with diabetic neuropathy, unspecified: Secondary | ICD-10-CM | POA: Diagnosis not present

## 2022-07-17 DIAGNOSIS — Z794 Long term (current) use of insulin: Secondary | ICD-10-CM

## 2022-07-17 NOTE — Progress Notes (Signed)
This patient presents to the office for diabetic foot exam.  This patient says there is no pain or discomfort in his feet.  No history of infection or drainage.  This patient presents to the office for foot exam due to having a history of diabetes.  Vascular  Dorsalis pedis and posterior tibial pulses are palpable  B/L.  Capillary return  WNL.  Temperature gradient is  WNL.  Skin turgor  WNL  Sensorium  Senn Weinstein monofilament wire  WNL. Normal tactile sensation.  Nail Exam  Patient has normal nails with no evidence of bacterial or fungal infection.  Orthopedic  Exam  Muscle tone and muscle strength  WNL.  No limitations of motion feet  B/L.  No crepitus or joint effusion noted.  Foot type is unremarkable and digits show no abnormalities.  Bony prominences are unremarkable.  Skin  No open lesions.  Normal skin texture and turgor.   Diabetes with no complications  Diabetic foot exam was performed.  There is no evidence of vascular or neurologic pathology.  RTC   Helane Gunther DPM

## 2022-07-21 ENCOUNTER — Other Ambulatory Visit: Payer: Self-pay

## 2022-07-24 ENCOUNTER — Other Ambulatory Visit: Payer: Self-pay

## 2022-08-08 ENCOUNTER — Other Ambulatory Visit: Payer: Self-pay

## 2022-08-15 ENCOUNTER — Other Ambulatory Visit: Payer: Self-pay

## 2022-08-18 ENCOUNTER — Emergency Department (HOSPITAL_COMMUNITY)
Admission: EM | Admit: 2022-08-18 | Discharge: 2022-08-18 | Disposition: A | Discharge status: Home / Self Care | Payer: Medicaid Other | Attending: Emergency Medicine | Admitting: Emergency Medicine

## 2022-08-18 ENCOUNTER — Other Ambulatory Visit: Payer: Self-pay

## 2022-08-18 ENCOUNTER — Encounter (HOSPITAL_COMMUNITY): Payer: Self-pay | Admitting: Emergency Medicine

## 2022-08-18 DIAGNOSIS — Z8673 Personal history of transient ischemic attack (TIA), and cerebral infarction without residual deficits: Secondary | ICD-10-CM | POA: Insufficient documentation

## 2022-08-18 DIAGNOSIS — E119 Type 2 diabetes mellitus without complications: Secondary | ICD-10-CM | POA: Diagnosis not present

## 2022-08-18 DIAGNOSIS — Z7902 Long term (current) use of antithrombotics/antiplatelets: Secondary | ICD-10-CM | POA: Insufficient documentation

## 2022-08-18 DIAGNOSIS — I10 Essential (primary) hypertension: Secondary | ICD-10-CM | POA: Diagnosis not present

## 2022-08-18 DIAGNOSIS — H1031 Unspecified acute conjunctivitis, right eye: Secondary | ICD-10-CM | POA: Diagnosis not present

## 2022-08-18 DIAGNOSIS — Z794 Long term (current) use of insulin: Secondary | ICD-10-CM | POA: Insufficient documentation

## 2022-08-18 DIAGNOSIS — H5711 Ocular pain, right eye: Secondary | ICD-10-CM | POA: Diagnosis present

## 2022-08-18 DIAGNOSIS — Z79899 Other long term (current) drug therapy: Secondary | ICD-10-CM | POA: Diagnosis not present

## 2022-08-18 MED ORDER — GENTAMICIN SULFATE 0.3 % OP SOLN: 2.0000 [drp] | OPHTHALMIC | 0 refills | Status: AC

## 2022-08-18 MED ORDER — FLUORESCEIN SODIUM 1 MG OP STRP
1.0000 | ORAL_STRIP | Freq: Once | OPHTHALMIC | Status: AC
  Administered 2022-08-18: 1 via OPHTHALMIC
  Filled 2022-08-18: qty 1

## 2022-08-18 MED ORDER — TETRACAINE HCL 0.5 % OP SOLN
2.0000 [drp] | Freq: Once | OPHTHALMIC | Status: AC
  Administered 2022-08-18: 2 [drp] via OPHTHALMIC
  Filled 2022-08-18: qty 4

## 2022-08-18 NOTE — ED Triage Notes (Signed)
Pt with right eye pain.  Partner is here being seen for same. They are concerned since she had shingles in her eye previously or wonder if it is possibly pink eye.

## 2022-08-18 NOTE — ED Provider Notes (Signed)
[place] EMERGENCY DEPARTMENT AT [facility] Provider Note   CSN: [id number] Arrival date & time: 08/18/22  2009     History  Chief Complaint  Patient presents with   Eye Pain    Nathaniel Spears is a 47 y.o. male.  Patient presents the emergency department complaining of right eye discomfort.  He states he feels there is something in his eye.  He states that for the past 2 days he has woken up in the morning with crusty right eye.  His partner is here in the hospital being seen for the same complaint.  Patient denies vision changes, frank pain.  Past medical history significant for type II DM, hypertension, CVA, polysubstance abuse  HPI     Home Medications Prior to Admission medications   Medication Sig Start Date End Date Taking? Authorizing Provider  gentamicin (GARAMYCIN) 0.3 % ophthalmic solution Place 2 drops into both eyes every 4 (four) hours for 7 days. 08/18/22 08/25/22 Yes Darrick Grinder, PA-C  Accu-Chek Softclix Lancets lancets Use to check blood sugar three times daily. 08/10/21   Hoy Register, MD  amLODipine (NORVASC) 10 MG tablet Take 1 tablet (10 mg total) by mouth daily. 04/25/22   Hoy Register, MD  Blood Glucose Monitoring Suppl (ACCU-CHEK GUIDE) w/Device KIT Use to check blood sugar three times daily. 08/10/21   Hoy Register, MD  Blood Pressure Monitoring (BLOOD PRESSURE CUFF) MISC Use to check blood pressure once daily. 10/13/21   Hoy Register, MD  carvedilol (COREG) 6.25 MG tablet Take 1 tablet (6.25 mg total) by mouth 2 (two) times daily with a meal. 02/28/22   Grayce Sessions, NP  chlorhexidine (PERIDEX) 0.12 % solution Swish 15 milliliters for 30 seconds 2 times per day after brushing teeth, then spit out for 4 days. 12/27/21     clopidogrel (PLAVIX) 75 MG tablet Take 1 tablet (75 mg total) by mouth once daily. Patient not taking: Reported on 08/10/2021 06/29/21   Horton Chin, MD  empagliflozin (JARDIANCE) 25 MG TABS tablet Take 1  tablet (25 mg total) by mouth daily before breakfast. 06/23/22   Grayce Sessions, NP  erythromycin ophthalmic ointment Place a 1/2 inch ribbon of ointment into the lower eyelid twice daily for 7 days. 08/12/20   Gustavus Bryant, FNP  glucose blood (ACCU-CHEK GUIDE) test strip Use to check blood sugar three times daily. 07/14/22   Grayce Sessions, NP  insulin glargine (LANTUS SOLOSTAR) 100 UNIT/ML Solostar Pen Inject 32 Units into the skin daily. 05/26/22   Hoy Register, MD  Insulin Pen Needle (PENTIPS) 32G X 4 MM MISC Use as directed 04/25/22   Hoy Register, MD  loperamide (IMODIUM) 2 MG capsule Take by mouth. Patient not taking: Reported on 08/10/2021 09/22/17   [provider]  traZODone (DESYREL) 50 MG tablet Take 0.5-1 tablets (25-50 mg total) by mouth at bedtime as needed for sleep. 04/26/22   Grayce Sessions, NP  valsartan-hydrochlorothiazide (DIOVAN-HCT) 320-25 MG tablet Take 1 tablet by mouth daily. 10/13/21   Hoy Register, MD      Allergies    Milk-related compounds    Review of Systems   Review of Systems  Physical Exam Updated Vital Signs BP (!) 148/84 (BP Location: Right Arm)   Pulse 84   Temp 98.2 F (36.8 C) (Oral)   Resp 16   Ht 6' (1.829 m)   Wt 113.4 kg   SpO2 97%   BMI 33.91 kg/m  Physical Exam HENT:  Head: Normocephalic and atraumatic.  Eyes:     General: Vision grossly intact. No visual field deficit.       Right eye: Discharge present. No foreign body.        Left eye: No discharge.     Extraocular Movements: Extraocular movements intact.     Conjunctiva/sclera:     Right eye: Right conjunctiva is injected.     Left eye: Left conjunctiva is not injected.     Pupils: Pupils are equal, round, and reactive to light.     Right eye: No corneal abrasion or fluorescein uptake.  Pulmonary:     Effort: Pulmonary effort is normal. No respiratory distress.  Musculoskeletal:        General: No signs of injury.     Cervical back: Normal range  of motion.  Skin:    General: Skin is dry.  Neurological:     Mental Status: He is alert.  Psychiatric:        Speech: Speech normal.        Behavior: Behavior normal.     ED Results / Procedures / Treatments   Labs (all labs ordered are listed, but only abnormal results are displayed) Labs Reviewed - No data to display  EKG None  Radiology No results found.  Procedures Procedures    Medications Ordered in ED Medications  tetracaine (PONTOCAINE) 0.5 % ophthalmic solution 2 drop (2 drops Right Eye Given 08/18/22 2247)  fluorescein ophthalmic strip 1 strip (1 strip Right Eye Given 08/18/22 2248)    ED Course/ Medical Decision Making/ A&P                                 Medical Decision Making Risk Prescription drug management.   Patient presents to the emergency department with a chief complaint of right eye discomfort.  Differential diagnosis includes conjunctivitis, corneal abrasion, corneal ulcer, others  I performed an eye exam using fluorescein stain and Woods lamp.  No fluorescein uptake noted.  No Seidel sign.  No corneal ulcer or abrasion noted.  Patient does have discharge in the right eye and mild injection.  Symptoms seem most consistent with conjunctivitis.  Plan to discharge patient with prescription for antibiotic eyedrops.  Patient instructed to apply drops to both eyes as there is a good chance the infection will spread to the other eye.  Patient advised to change pillowcase after 2 to 3 days.  He may use Tylenol as needed for pain and discomfort.  Patient advised to use warm compresses in the morning and prior to administering eyedrops.  Return precautions provided.        Final Clinical Impression(s) / ED Diagnoses Final diagnoses:  Acute conjunctivitis of right eye, unspecified acute conjunctivitis type    Rx / DC Orders ED Discharge Orders          Ordered    gentamicin (GARAMYCIN) 0.3 % ophthalmic solution  Every 4 hours        08/18/22  2330              Pamala Duffel 08/18/22 2331    Palumbo, April, MD 08/18/22 2342

## 2022-08-18 NOTE — Discharge Instructions (Addendum)
You were evaluated today for right eye discomfort.  Your symptoms seem most consistent with conjunctivitis.  I have prescribed eyedrops to be taken as directed.  I recommend using warm compresses in the morning to break up the crusty areas in your eye and I also recommend using the warm compresses prior to using eyedrops.  Please apply the eyedrops to both eyes as the infection will likely spread from the right eye to the left eye.  After 2 to 3 days of antibiotic usage I recommend changing her pillowcase.  Please avoid touching your eye and wash her hands frequently.

## 2022-08-28 ENCOUNTER — Other Ambulatory Visit: Payer: Self-pay | Admitting: Family Medicine

## 2022-08-28 ENCOUNTER — Other Ambulatory Visit: Payer: Self-pay

## 2022-08-29 ENCOUNTER — Other Ambulatory Visit: Payer: Self-pay

## 2022-08-29 MED ORDER — LANTUS SOLOSTAR 100 UNIT/ML ~~LOC~~ SOPN
32.0000 [IU] | PEN_INJECTOR | Freq: Every day | SUBCUTANEOUS | 1 refills | Status: DC
  Filled 2022-08-29: qty 15, 46d supply, fill #0

## 2022-08-30 ENCOUNTER — Other Ambulatory Visit: Payer: Self-pay

## 2022-09-08 ENCOUNTER — Other Ambulatory Visit: Payer: Self-pay

## 2022-09-20 ENCOUNTER — Ambulatory Visit (INDEPENDENT_AMBULATORY_CARE_PROVIDER_SITE_OTHER): Payer: Medicaid Other | Admitting: Primary Care

## 2022-09-21 ENCOUNTER — Ambulatory Visit (INDEPENDENT_AMBULATORY_CARE_PROVIDER_SITE_OTHER): Payer: Medicaid Other | Admitting: Primary Care

## 2022-09-21 ENCOUNTER — Encounter (INDEPENDENT_AMBULATORY_CARE_PROVIDER_SITE_OTHER): Payer: Self-pay | Admitting: Primary Care

## 2022-09-21 ENCOUNTER — Other Ambulatory Visit: Payer: Self-pay

## 2022-09-21 VITALS — BP 113/78 | HR 75 | Resp 16 | Wt 258.4 lb

## 2022-09-21 DIAGNOSIS — Z794 Long term (current) use of insulin: Secondary | ICD-10-CM | POA: Diagnosis not present

## 2022-09-21 DIAGNOSIS — E1165 Type 2 diabetes mellitus with hyperglycemia: Secondary | ICD-10-CM

## 2022-09-21 DIAGNOSIS — Z23 Encounter for immunization: Secondary | ICD-10-CM | POA: Diagnosis not present

## 2022-09-21 LAB — POCT GLYCOSYLATED HEMOGLOBIN (HGB A1C): HbA1c, POC (controlled diabetic range): 8.6 % — AB (ref 0.0–7.0)

## 2022-09-21 MED ORDER — LANTUS SOLOSTAR 100 UNIT/ML ~~LOC~~ SOPN
36.0000 [IU] | PEN_INJECTOR | Freq: Every day | SUBCUTANEOUS | 1 refills | Status: DC
  Filled 2022-09-21: qty 15, 38d supply, fill #0
  Filled 2022-09-21 – 2022-10-04 (×2): qty 15, 41d supply, fill #0
  Filled 2022-11-21: qty 15, 41d supply, fill #1

## 2022-09-21 NOTE — Progress Notes (Signed)
Subjective:  Patient ID: Nathaniel Spears, male    DOB: 1975-09-14  Age: 47 y.o. MRN: [id number]  CC: Diabetes   HPI Nathaniel Spears presents forFollow-up of diabetes. Patient does  check blood sugar at home. Nathaniel Spears has a sweet feetish and will not give up sweets.  Compliant with meds - Yes Checking CBGs? Yes  Fasting avg - 110-220  Postprandial average -  Exercising regularly? - Yes Watching carbohydrate intake? - Yes Neuropathy ? - No Hypoglycemic events - No  - Recovers with :   Pertinent ROS:  Polyuria - No Polydipsia - No Vision problems - No  Medications as noted below. Taking them regularly without complication/adverse reaction being reported today.   History Nathaniel Spears has a past medical history of Diabetes mellitus without complication (HCC), Hypertension, and Stroke (HCC).   Nathaniel Spears has a past surgical history that includes TEE without cardioversion (N/A, 06/01/2020); Bubble study (06/01/2020); IR ANGIO INTRA EXTRACRAN SEL COM CAROTID INNOMINATE BILAT MOD SED (06/02/2020); and IR ANGIO VERTEBRAL SEL SUBCLAVIAN INNOMINATE BILAT MOD SED (06/02/2020).   His family history is not on file.Nathaniel Spears reports that Nathaniel Spears has quit smoking. His smoking use included cigarettes. Nathaniel Spears has never used smokeless tobacco. Nathaniel Spears reports that Nathaniel Spears does not currently use alcohol. Nathaniel Spears reports that Nathaniel Spears does not use drugs.  Current Outpatient Medications on File Prior to Visit  Medication Sig Dispense Refill   amLODipine (NORVASC) 10 MG tablet Take 1 tablet (10 mg total) by mouth daily. 90 tablet 1   empagliflozin (JARDIANCE) 25 MG TABS tablet Take 1 tablet (25 mg total) by mouth daily before breakfast. 90 tablet 1   erythromycin ophthalmic ointment Place a 1/2 inch ribbon of ointment into the lower eyelid twice daily for 7 days. (Patient not taking: Reported on 09/25/2022) 3.5 g 0   Accu-Chek Softclix Lancets lancets Use to check blood sugar three times daily. 100 each 2   Blood Glucose Monitoring Suppl (ACCU-CHEK GUIDE)  w/Device KIT Use to check blood sugar three times daily. 1 kit 0   Blood Pressure Monitoring (BLOOD PRESSURE CUFF) MISC Use to check blood pressure once daily. 1 each 0   chlorhexidine (PERIDEX) 0.12 % solution Swish 15 milliliters for 30 seconds 2 times per day after brushing teeth, then spit out for 4 days. 473 mL 0   clopidogrel (PLAVIX) 75 MG tablet Take 1 tablet (75 mg total) by mouth once daily. (Patient not taking: Reported on 08/10/2021) 30 tablet 3   glucose blood (ACCU-CHEK GUIDE) test strip Use to check blood sugar three times daily. 100 each 2   Insulin Pen Needle (PENTIPS) 32G X 4 MM MISC Use as directed (Patient not taking: Reported on 09/25/2022) 100 each 1   loperamide (IMODIUM) 2 MG capsule Take by mouth. (Patient not taking: Reported on 08/10/2021)     traZODone (DESYREL) 50 MG tablet Take 0.5-1 tablets (25-50 mg total) by mouth at bedtime as needed for sleep. (Patient not taking: Reported on 09/21/2022) 90 tablet 0   valsartan-hydrochlorothiazide (DIOVAN-HCT) 320-25 MG tablet Take 1 tablet by mouth daily. 90 tablet 3   No current facility-administered medications on file prior to visit.    ROS Comprehensive ROS Pertinent positive and negative noted in HPI    Objective:  BP 113/78 (BP Location: Right Arm, Patient Position: Sitting, Cuff Size: Normal)   Pulse 75   Resp 16   Wt 258 lb 6.4 oz (117.2 kg)   SpO2 98%   BMI 35.05 kg/m   BP Readings from Last  3 Encounters:  09/25/22 (!) 180/120  09/21/22 113/78  08/18/22 (!) 148/84    Wt Readings from Last 3 Encounters:  09/25/22 257 lb (116.6 kg)  09/21/22 258 lb 6.4 oz (117.2 kg)  08/18/22 250 lb (113.4 kg)    Physical Exam  Lab Results  Component Value Date   HGBA1C 8.6 (A) 09/21/2022   HGBA1C 8.8 (A) 06/20/2022   HGBA1C 10.4 (A) 03/20/2022    Lab Results  Component Value Date   WBC 6.9 08/25/2021   HGB 14.4 08/25/2021   HCT 46.0 08/25/2021   PLT 257 08/25/2021   GLUCOSE 161 (H) 11/16/2021   CHOL 109  11/16/2021   TRIG 148 11/16/2021   HDL 29 (L) 11/16/2021   LDLCALC 54 11/16/2021   ALT 15 11/16/2021   AST 17 11/16/2021   NA 140 11/16/2021   K 3.8 11/16/2021   CL 97 11/16/2021   CREATININE 1.30 (H) 11/16/2021   BUN 16 11/16/2021   CO2 28 11/16/2021   TSH 1.156 04/21/2020   INR 1.1 06/15/2020   HGBA1C 8.6 (A) 09/21/2022     Assessment & Plan:   Nathaniel Spears was seen today for diabetes.  Diagnoses and all orders for this visit:  Encounter for immunization -     Flu vaccine trivalent PF, 6mos and older(Flulaval,Afluria,Fluarix,Fluzone)  Type 2 diabetes mellitus with hyperglycemia, with long-term current use of insulin (HCC) -     POCT glycosylated hemoglobin (Hb A1C) -     Ambulatory referral to Ophthalmology   I have changed Nathaniel Spears's Lantus SoloStar. I am also having him maintain his erythromycin, loperamide, clopidogrel, Accu-Chek Guide, Accu-Chek Softclix Lancets, Blood Pressure Cuff, valsartan-hydrochlorothiazide, chlorhexidine, traZODone, amLODipine, Pentips, empagliflozin, and Accu-Chek Guide.  Meds ordered this encounter  Medications   insulin glargine (LANTUS SOLOSTAR) 100 UNIT/ML Solostar Pen    Sig: Inject 36 Units into the skin daily.    Dispense:  15 mL    Refill:  1    Order Specific Question:   Supervising Provider    Answer:   Quentin Angst [[id number]]     Follow-up:   Return in about 3 months (around 12/21/2022).  The above assessment and management plan was discussed with the patient. The patient verbalized understanding of and has agreed to the management plan. Patient is aware to call the clinic if symptoms fail to improve or worsen. Patient is aware when to return to the clinic for a follow-up visit. Patient educated on when it is appropriate to go to the emergency department.   Gwinda Passe, NP-C

## 2022-09-22 ENCOUNTER — Other Ambulatory Visit: Payer: Self-pay

## 2022-09-22 ENCOUNTER — Other Ambulatory Visit: Payer: Self-pay | Admitting: Primary Care

## 2022-09-22 DIAGNOSIS — I10 Essential (primary) hypertension: Secondary | ICD-10-CM

## 2022-09-22 NOTE — Telephone Encounter (Signed)
Will forward to provider  

## 2022-09-25 ENCOUNTER — Ambulatory Visit (INDEPENDENT_AMBULATORY_CARE_PROVIDER_SITE_OTHER): Payer: Medicaid Other | Admitting: Nurse Practitioner

## 2022-09-25 ENCOUNTER — Other Ambulatory Visit (INDEPENDENT_AMBULATORY_CARE_PROVIDER_SITE_OTHER): Payer: Self-pay | Admitting: Primary Care

## 2022-09-25 ENCOUNTER — Other Ambulatory Visit: Payer: Self-pay | Admitting: Primary Care

## 2022-09-25 ENCOUNTER — Other Ambulatory Visit: Payer: Self-pay

## 2022-09-25 ENCOUNTER — Encounter: Payer: Self-pay | Admitting: Nurse Practitioner

## 2022-09-25 ENCOUNTER — Ambulatory Visit: Payer: Self-pay | Admitting: *Deleted

## 2022-09-25 VITALS — BP 180/120 | HR 75 | Ht 73.0 in | Wt 257.0 lb

## 2022-09-25 DIAGNOSIS — I10 Essential (primary) hypertension: Secondary | ICD-10-CM | POA: Diagnosis not present

## 2022-09-25 DIAGNOSIS — Z1211 Encounter for screening for malignant neoplasm of colon: Secondary | ICD-10-CM | POA: Diagnosis not present

## 2022-09-25 MED ORDER — CARVEDILOL 6.25 MG PO TABS
6.2500 mg | ORAL_TABLET | Freq: Two times a day (BID) | ORAL | 0 refills | Status: DC
  Filled 2022-09-25: qty 180, 90d supply, fill #0

## 2022-09-25 NOTE — Telephone Encounter (Signed)
  Chief Complaint: Medication Refill, Hypertension Symptoms: BP 180/120 at practice today for colonoscopy.  SO at pharmacy for Coreg refill, not at pharmacy. Did refill medication per protocol. Frequency: Today Pertinent Negatives: Patient denies  Disposition: [x] ED /[] Urgent Care (no appt availability in office) / [] Appointment(In office/virtual)/ []  [facility]/ [] Home Care/ [] Refused Recommended Disposition /[] [facility]/ []  Follow-up with PCP Additional Notes:   [name] S/O calling for pt, pt present. NT called pt back. States 8/10 headache, still at pharmacy.  Pt has not missed any doses of med as initially thought. [name] states will recheck BP at home and if elevated will go to ED as advised.  Reason for Disposition  Systolic BP  >= 180 OR Diastolic >= 110  Answer Assessment - Initial Assessment Questions 1. NAME of MEDICINE: "What medicine(s) are you calling about?"     Coreg 2. QUESTION: "What is your question?" (e.g., double dose of medicine, side effect)     Not at pharmacy 3. PRESCRIBER: "Who prescribed the medicine?" Reason: if prescribed by specialist, call should be referred to that group.     PCP 4. SYMPTOMS: "Do you have any symptoms?" If Yes, ask: "What symptoms are you having?"  "How bad are the symptoms (e.g., mild, moderate, severe)     Hypertension  Answer Assessment - Initial Assessment Questions 1. BLOOD PRESSURE: "What is the blood pressure?" "Did you take at least two measurements 5 minutes apart?"     180/120 2. ONSET: "When did you take your blood pressure?"     This AM at GI [facility] for colonoscopy.  3. HOW: "How did you take your blood pressure?" (e.g., automatic home BP monitor, visiting nurse)     MD 4. HISTORY: "Do you have a history of high blood pressure?"     Yes 5. MEDICINES: "Are you taking any medicines for blood pressure?" "Have you missed any doses recently?"     Yes 6. OTHER SYMPTOMS: "Do you have any symptoms?" (e.g.,  blurred vision, chest pain, difficulty breathing, headache, weakness)     Headache  Protocols used: Medication Question Call-A-AH, Blood Pressure - High-A-AH

## 2022-09-25 NOTE — Telephone Encounter (Signed)
Requested Prescriptions  Pending Prescriptions Disp Refills   carvedilol (COREG) 6.25 MG tablet 180 tablet 0    Sig: Take 1 tablet (6.25 mg total) by mouth 2 (two) times daily with a meal.     Cardiovascular: Beta Blockers 3 Failed - 09/25/2022  4:49 PM      Failed - Cr in normal range and within 360 days    Creatinine, Ser  Date Value Ref Range Status  11/16/2021 1.30 (H) 0.76 - 1.27 mg/dL Final         Failed - Last BP in normal range    BP Readings from Last 1 Encounters:  09/25/22 (!) 180/120         Passed - AST in normal range and within 360 days    AST  Date Value Ref Range Status  11/16/2021 17 0 - 40 IU/L Final         Passed - ALT in normal range and within 360 days    ALT  Date Value Ref Range Status  11/16/2021 15 0 - 44 IU/L Final         Passed - Last Heart Rate in normal range    Pulse Readings from Last 1 Encounters:  09/25/22 75         Passed - Valid encounter within last 6 months    Recent Outpatient Visits           4 days ago Encounter for immunization   [facility] Grayce Sessions, NP   3 months ago Type 2 diabetes mellitus with hyperglycemia, with long-term current use of insulin (HCC)   [facility] Grayce Sessions, NP   4 months ago Essential hypertension   [facility] [place], Cornelius Moras, RPH-CPP   5 months ago Essential hypertension   [facility] [place], Alfarata L, RPH-CPP   6 months ago Type 2 diabetes mellitus with hyperglycemia, with long-term current use of insulin [facility])   [place] Grayce Sessions, NP       Future Appointments             In 2 months Randa Evens, Kinnie Scales, NP [facility]

## 2022-09-25 NOTE — Progress Notes (Signed)
ASSESSMENT & PLAN   47 y.o. yo male, new to the practice, referred for colon cancer screening  Colon cancer screening. No FMH of colon cancer. No alarm symptoms  --Unable to schedule for colonoscopy given elevated BP  (160-180  / 120 ).   --He will contact PCP today about BP. Will see him back at later date to reevaluate for screening colonoscopy.   History of CVA ( ~ 4 years ago), on plavix.   HTN. Elevated BP as above BP 160-180 / 120  ( Rechecked with large BP cuff)  History of DM.   See PMH for any additional medical history   HPI   Chief complaint : None.  Here for colon cancer screening  Echo gives a history of multiple CVAs, last 1 was reportedly 4 years ago.  On Plavix since stroke.   He has no GI concerns. No FMH of colon cancer. No bowel changes or blood in stool. Weight is stable.    Previous GI Studies   None  Labs      Latest Ref Rng & Units 08/25/2021   12:40 PM 06/24/2020    5:32 AM 06/23/2020    4:08 PM  CBC  WBC 4.0 - 10.5 K/uL 6.9  5.1  5.5   Hemoglobin 13.0 - 17.0 g/dL 45.4  09.8  11.9   Hematocrit 39.0 - 52.0 % 46.0  37.3  40.6   Platelets 150 - 400 K/uL 257  171  203     No results found for: "LIPASE"    Latest Ref Rng & Units 11/16/2021    2:28 PM 08/25/2021   12:40 PM 07/08/2020    6:59 AM  CMP  Glucose 70 - 99 mg/dL 147  829  562   BUN 6 - 24 mg/dL 16  13  16    Creatinine 0.76 - 1.27 mg/dL 1.30  8.65  7.84   Sodium 134 - 144 mmol/L 140  138  139   Potassium 3.5 - 5.2 mmol/L 3.8  3.5  3.5   Chloride 96 - 106 mmol/L 97  106  103   CO2 20 - 29 mmol/L 28  23  26    Calcium 8.7 - 10.2 mg/dL 8.9  9.0  9.3   Total Protein 6.0 - 8.5 g/dL 7.1     Total Bilirubin 0.0 - 1.2 mg/dL 0.3     Alkaline Phos 44 - 121 IU/L 102     AST 0 - 40 IU/L 17     ALT 0 - 44 IU/L 15         CT Head Wo Contrast CLINICAL DATA:  Headache, sudden, severe headache, history of cva, patient hypertensive  EXAM: CT HEAD WITHOUT CONTRAST  TECHNIQUE: Contiguous  axial images were obtained from the base of the skull through the vertex without intravenous contrast.  RADIATION DOSE REDUCTION: This exam was performed according to the departmental dose-optimization program which includes automated exposure control, adjustment of the mA and/or kV according to patient size and/or use of iterative reconstruction technique.  COMPARISON:  MRI head June 16, 2020. CT head Jun 15, 2020.  FINDINGS: Brain: No evidence of acute large vascular territory infarction, hemorrhage, hydrocephalus, extra-axial collection or mass lesion/mass effect. Remote infarcts and chronic microvascular ischemic disease, grossly similar to prior CT head.  Vascular: No hyperdense vessel identified.  Skull: No acute fracture.  Sinuses/Orbits: Clear sinuses.  No acute orbital findings.  Other: No mastoid effusions.  IMPRESSION: 1. No evidence of acute intracranial  abnormality. 2. Remote infarcts and chronic microvascular ischemic disease, grossly similar to prior CT head. MRI could provide more sensitive evaluation for acute infarct if clinically warranted.  Electronically Signed   By: Feliberto Harts M.D.   On: 08/25/2021 12:05    Past Medical History:  Diagnosis Date   Diabetes mellitus without complication (HCC)    Hypertension    Stroke [facility])    Past Surgical History:  Procedure Laterality Date   BUBBLE STUDY  06/01/2020   Procedure: BUBBLE STUDY;  Surgeon: Little Ishikawa, MD;  Location[facility] ENDOSCOPY;  Service: Cardiovascular;;   IR ANGIO INTRA EXTRACRAN SEL COM CAROTID INNOMINATE BILAT MOD SED  06/02/2020   IR ANGIO VERTEBRAL SEL SUBCLAVIAN INNOMINATE BILAT MOD SED  06/02/2020   TEE WITHOUT CARDIOVERSION N/A 06/01/2020   Procedure: TRANSESOPHAGEAL ECHOCARDIOGRAM (TEE);  Surgeon: Little Ishikawa, MD;  Location[facility] ENDOSCOPY;  Service: Cardiovascular;  Laterality: N/A;   Family History  Problem Relation Age of Onset   Stroke Neg Hx    Social  History   Tobacco Use   Smoking status: Former    Types: Cigarettes   Smokeless tobacco: Never  Vaping Use   Vaping status: Never Used  Substance Use Topics   Alcohol use: Not Currently   Drug use: Never   Current Outpatient Medications  Medication Sig Dispense Refill   Accu-Chek Softclix Lancets lancets Use to check blood sugar three times daily. 100 each 2   amLODipine (NORVASC) 10 MG tablet Take 1 tablet (10 mg total) by mouth daily. 90 tablet 1   Blood Glucose Monitoring Suppl (ACCU-CHEK GUIDE) w/Device KIT Use to check blood sugar three times daily. 1 kit 0   Blood Pressure Monitoring (BLOOD PRESSURE CUFF) MISC Use to check blood pressure once daily. 1 each 0   carvedilol (COREG) 6.25 MG tablet Take 1 tablet (6.25 mg total) by mouth 2 (two) times daily with a meal. 180 tablet 1   chlorhexidine (PERIDEX) 0.12 % solution Swish 15 milliliters for 30 seconds 2 times per day after brushing teeth, then spit out for 4 days. 473 mL 0   empagliflozin (JARDIANCE) 25 MG TABS tablet Take 1 tablet (25 mg total) by mouth daily before breakfast. 90 tablet 1   glucose blood (ACCU-CHEK GUIDE) test strip Use to check blood sugar three times daily. 100 each 2   valsartan-hydrochlorothiazide (DIOVAN-HCT) 320-25 MG tablet Take 1 tablet by mouth daily. 90 tablet 3   clopidogrel (PLAVIX) 75 MG tablet Take 1 tablet (75 mg total) by mouth once daily. (Patient not taking: Reported on 08/10/2021) 30 tablet 3   erythromycin ophthalmic ointment Place a 1/2 inch ribbon of ointment into the lower eyelid twice daily for 7 days. (Patient not taking: Reported on 09/25/2022) 3.5 g 0   insulin glargine (LANTUS SOLOSTAR) 100 UNIT/ML Solostar Pen Inject 36 Units into the skin daily. (Patient not taking: Reported on 09/25/2022) 15 mL 1   Insulin Pen Needle (PENTIPS) 32G X 4 MM MISC Use as directed (Patient not taking: Reported on 09/25/2022) 100 each 1   loperamide (IMODIUM) 2 MG capsule Take by mouth. (Patient not taking:  Reported on 08/10/2021)     traZODone (DESYREL) 50 MG tablet Take 0.5-1 tablets (25-50 mg total) by mouth at bedtime as needed for sleep. (Patient not taking: Reported on 09/21/2022) 90 tablet 0   No current facility-administered medications for this visit.   Allergies  Allergen Reactions   Milk-Related Compounds     Review of Systems: All  systems reviewed and negative except where noted in HPI.   Wt Readings from Last 3 Encounters:  09/25/22 257 lb (116.6 kg)  09/21/22 258 lb 6.4 oz (117.2 kg)  08/18/22 250 lb (113.4 kg)    Physical Exam:  BP (!) 160/120   Pulse 75   Ht 6\' 1"  (1.854 m)   Wt 257 lb (116.6 kg)   BMI 33.91 kg/m  Constitutional:  Pleasant, generally well appearing male in no acute distress.  Psychiatric:  Normal mood and affect. Behavior is normal. EENT: Pupils normal.  Conjunctivae are normal. No scleral icterus. Neck supple.  Cardiovascular: Normal rate, regular rhythm.  Pulmonary/chest: Effort normal and breath sounds normal. No wheezing, rales or rhonchi. Abdominal: Soft, nondistended, nontender. Bowel sounds active throughout. There are no masses palpable. No hepatomegaly. Neurological: Alert and oriented to person place and time. Extremities:  No edema   Willette Cluster, NP  09/25/2022, 9:58 AM  Cc:  Referring Provider Grayce Sessions, NP

## 2022-09-25 NOTE — Telephone Encounter (Signed)
Medication Refill - Medication: carvedilol (COREG) 6.25 MG tablet  Has the patient contacted their pharmacy? Yes.   (  Preferred Pharmacy (with phone number or street name):  [facility] [address], [address], [place] [address] Phone: [phone]  Fax: [phone] DEA #: [id number]         Has the patient been seen for an appointment in the last year OR does the patient have an upcoming appointment? Yes.    Agent: Please be advised that RX refills may take up to 3 business days. We ask that you follow-up with your pharmacy.

## 2022-09-25 NOTE — Patient Instructions (Signed)
Please contact your PCP today about your High blood pressure, we will reevaluate you once BP is under control when you follow back up to see Gunnar Fusi in November.   _______________________________________________________  If your blood pressure at your visit was 140/90 or greater, please contact your primary care physician to follow up on this.  _______________________________________________________  If you are age 47 or older, your body mass index should be between 23-30. Your Body mass index is 33.91 kg/m. If this is out of the aforementioned range listed, please consider follow up with your Primary Care Provider.  If you are age 21 or younger, your body mass index should be between 19-25. Your Body mass index is 33.91 kg/m. If this is out of the aformentioned range listed, please consider follow up with your Primary Care Provider.   ________________________________________________________  The [facility] providers would like to encourage you to use [facility] to communicate with providers for non-urgent requests or questions.  Due to long hold times on the telephone, sending your provider a message by [facility] may be a faster and more efficient way to get a response.  Please allow 48 business hours for a response.  Please remember that this is for non-urgent requests.  _______________________________________________________   I appreciate the  opportunity to care for you  Thank You   Midge Minium

## 2022-09-26 NOTE — Telephone Encounter (Signed)
FYI

## 2022-09-26 NOTE — Telephone Encounter (Signed)
Requested Prescriptions  Refused Prescriptions Disp Refills   carvedilol (COREG) 6.25 MG tablet 180 tablet 1    Sig: Take 1 tablet (6.25 mg total) by mouth 2 (two) times daily with a meal.     Cardiovascular: Beta Blockers 3 Failed - 09/25/2022  3:28 PM      Failed - Cr in normal range and within 360 days    Creatinine, Ser  Date Value Ref Range Status  11/16/2021 1.30 (H) 0.76 - 1.27 mg/dL Final         Failed - Last BP in normal range    BP Readings from Last 1 Encounters:  09/25/22 (!) 180/120         Passed - AST in normal range and within 360 days    AST  Date Value Ref Range Status  11/16/2021 17 0 - 40 IU/L Final         Passed - ALT in normal range and within 360 days    ALT  Date Value Ref Range Status  11/16/2021 15 0 - 44 IU/L Final         Passed - Last Heart Rate in normal range    Pulse Readings from Last 1 Encounters:  09/25/22 75         Passed - Valid encounter within last 6 months    Recent Outpatient Visits           5 days ago Encounter for immunization   [place] [facility] Grayce Sessions, NP   3 months ago Type 2 diabetes mellitus with hyperglycemia, with long-term current use of insulin (HCC)   [place] [facility] Grayce Sessions, NP   4 months ago Essential hypertension   [place] [facility] [place], Cornelius Moras, RPH-CPP   5 months ago Essential hypertension   [place] [facility] [place], Hays L, RPH-CPP   6 months ago Type 2 diabetes mellitus with hyperglycemia, with long-term current use of insulin [facility])   [place] [facility] Grayce Sessions, NP       Future Appointments             In 2 months Randa Evens, Kinnie Scales, NP [place] [facility]

## 2022-09-29 ENCOUNTER — Other Ambulatory Visit: Payer: Self-pay

## 2022-09-29 NOTE — Progress Notes (Cosign Needed Addendum)
Nathaniel Spears 11-13-1975 [id number]  Patient outreached by Thomasene Ripple , PharmD Candidate on 09/29/22. Called back and successfully answered. Patient reported that he was out of medications for one day due to refill issues and BP got uncontrolled. Patient now has refills and BP is back down.   Blood Pressure Readings: Last documented ambulatory systolic blood pressure: 180 Last documented ambulatory diastolic blood pressure: 120 Does the patient have a validated home blood pressure machine?: Yes They report home readings of 138/21mmHg  and 117/23mmHg.   Medication review was performed. Is the patient taking their medications as prescribed?: Yes   The following barriers to adherence were noted: Does the patient have cost concerns?: No Does the patient have transportation concerns?: No Does the patient need assistance obtaining refills?: No Does the patient occassionally forget to take some of their prescribed medications?: No Does the patient feel like one/some of their medications make them feel poorly?: No Does the patient have questions or concerns about their medications?: No Does the patient have a follow up scheduled with their primary care provider/cardiologist?: Yes   Interventions: Interventions Completed: Medications were reviewed  The patient has follow up scheduled:  PCP: Grayce Sessions, NP   Thomasene Ripple, Student-PharmD

## 2022-10-04 ENCOUNTER — Other Ambulatory Visit: Payer: Self-pay

## 2022-10-05 ENCOUNTER — Emergency Department (HOSPITAL_COMMUNITY)
Admission: EM | Admit: 2022-10-05 | Discharge: 2022-10-05 | Discharge status: Against Medical Advice | Payer: Medicaid Other | Attending: Emergency Medicine | Admitting: Emergency Medicine

## 2022-10-05 ENCOUNTER — Other Ambulatory Visit: Payer: Self-pay

## 2022-10-05 ENCOUNTER — Emergency Department (HOSPITAL_COMMUNITY): Payer: Medicaid Other

## 2022-10-05 DIAGNOSIS — M79642 Pain in left hand: Secondary | ICD-10-CM | POA: Insufficient documentation

## 2022-10-05 DIAGNOSIS — Z5321 Procedure and treatment not carried out due to patient leaving prior to being seen by health care provider: Secondary | ICD-10-CM | POA: Insufficient documentation

## 2022-10-05 NOTE — ED Triage Notes (Signed)
Patient in today reporting left hand pain, no trauma. Denies injury. Difficulty holding objects.

## 2022-10-23 ENCOUNTER — Other Ambulatory Visit: Payer: Self-pay | Admitting: Family Medicine

## 2022-10-23 DIAGNOSIS — I10 Essential (primary) hypertension: Secondary | ICD-10-CM

## 2022-10-24 NOTE — Telephone Encounter (Signed)
Requested medication (s) are due for refill today: yes  Requested medication (s) are on the active medication list: yes    Last refill: 10/13/21  #90  3 refills  Future visit scheduled yes 11/07/22  Notes to clinic:Historical provider, please review. Thank you.  Requested Prescriptions  Pending Prescriptions Disp Refills   valsartan-hydrochlorothiazide (DIOVAN-HCT) 320-25 MG tablet 90 tablet 3    Sig: Take 1 tablet by mouth daily.     Cardiovascular: ARB + Diuretic Combos Failed - 10/23/2022  8:50 PM      Failed - K in normal range and within 180 days    Potassium  Date Value Ref Range Status  11/16/2021 3.8 3.5 - 5.2 mmol/L Final         Failed - Na in normal range and within 180 days    Sodium  Date Value Ref Range Status  11/16/2021 140 134 - 144 mmol/L Final         Failed - Cr in normal range and within 180 days    Creatinine, Ser  Date Value Ref Range Status  11/16/2021 1.30 (H) 0.76 - 1.27 mg/dL Final         Failed - eGFR is 10 or above and within 180 days    GFR, Estimated  Date Value Ref Range Status  08/25/2021 >60 >60 mL/min Final    Comment:    (NOTE) Calculated using the CKD-EPI Creatinine Equation (2021)    eGFR  Date Value Ref Range Status  11/16/2021 69 >59 mL/min/1.73 Final         Passed - Patient is not pregnant      Passed - Last BP in normal range    BP Readings from Last 1 Encounters:  10/05/22 119/82         Passed - Valid encounter within last 6 months    Recent Outpatient Visits           1 month ago Encounter for immunization   [place] [facility] Grayce Sessions, NP   4 months ago Type 2 diabetes mellitus with hyperglycemia, with long-term current use of insulin (HCC)   [place] [facility] Grayce Sessions, NP   5 months ago Essential hypertension   [place] [facility] [place], Cornelius Moras, RPH-CPP   6 months ago Essential hypertension   [place]  [facility] [place], Wilmont L, RPH-CPP   7 months ago Type 2 diabetes mellitus with hyperglycemia, with long-term current use of insulin [facility])   [place] [facility] Grayce Sessions, NP       Future Appointments             In 2 weeks Randa Evens Kinnie Scales, NP [place] [facility]   In 1 month Randa Evens, Kinnie Scales, NP  [facility]

## 2022-10-27 ENCOUNTER — Other Ambulatory Visit (INDEPENDENT_AMBULATORY_CARE_PROVIDER_SITE_OTHER): Payer: Self-pay

## 2022-10-27 ENCOUNTER — Other Ambulatory Visit: Payer: Self-pay

## 2022-10-27 DIAGNOSIS — I10 Essential (primary) hypertension: Secondary | ICD-10-CM

## 2022-10-27 MED ORDER — VALSARTAN-HYDROCHLOROTHIAZIDE 320-25 MG PO TABS
1.0000 | ORAL_TABLET | Freq: Every day | ORAL | 1 refills | Status: DC
  Filled 2022-10-27: qty 90, 90d supply, fill #0
  Filled 2023-01-29: qty 90, 90d supply, fill #1

## 2022-11-07 ENCOUNTER — Ambulatory Visit (INDEPENDENT_AMBULATORY_CARE_PROVIDER_SITE_OTHER): Payer: Medicaid Other | Admitting: Primary Care

## 2022-11-07 ENCOUNTER — Encounter (INDEPENDENT_AMBULATORY_CARE_PROVIDER_SITE_OTHER): Payer: Self-pay | Admitting: Primary Care

## 2022-11-07 VITALS — BP 124/82 | HR 83 | Resp 16 | Wt 259.0 lb

## 2022-11-07 DIAGNOSIS — D229 Melanocytic nevi, unspecified: Secondary | ICD-10-CM

## 2022-11-07 DIAGNOSIS — L309 Dermatitis, unspecified: Secondary | ICD-10-CM

## 2022-11-07 NOTE — Progress Notes (Signed)
[facility]  Nathaniel Spears, is a 47 y.o. male  WUJ:[id number]  [id number]  DOB - 11/24/1975  Chief Complaint  Patient presents with   Rash   Nevus    Right side of chest  Noticed it years ago Not painful         Subjective:   Nathaniel Spears is a 47 y.o. male here today for an acute visit.  He voices concerns about a mole on his chest (ABCDE of cancer) and rash on right sacrum. See pictures . Patient has No headache, No chest pain, No abdominal pain - No Nausea, No new weakness tingling or numbness, No Cough - shortness of breath  No problems updated.  Allergies  Allergen Reactions   Milk-Related Compounds     Past Medical History:  Diagnosis Date   Diabetes mellitus without complication (HCC)    Hypertension    Stroke [facility])     Current Outpatient Medications on File Prior to Visit  Medication Sig Dispense Refill   Accu-Chek Softclix Lancets lancets Use to check blood sugar three times daily. 100 each 2   amLODipine (NORVASC) 10 MG tablet Take 1 tablet (10 mg total) by mouth daily. 90 tablet 1   Blood Glucose Monitoring Suppl (ACCU-CHEK GUIDE) w/Device KIT Use to check blood sugar three times daily. 1 kit 0   Blood Pressure Monitoring (BLOOD PRESSURE CUFF) MISC Use to check blood pressure once daily. 1 each 0   carvedilol (COREG) 6.25 MG tablet Take 1 tablet (6.25 mg total) by mouth 2 (two) times daily with a meal. 180 tablet 0   chlorhexidine (PERIDEX) 0.12 % solution Swish 15 milliliters for 30 seconds 2 times per day after brushing teeth, then spit out for 4 days. 473 mL 0   clopidogrel (PLAVIX) 75 MG tablet Take 1 tablet (75 mg total) by mouth once daily. (Patient not taking: Reported on 08/10/2021) 30 tablet 3   empagliflozin (JARDIANCE) 25 MG TABS tablet Take 1 tablet (25 mg total) by mouth daily before breakfast. 90 tablet 1   erythromycin ophthalmic ointment Place a 1/2 inch ribbon of ointment into the lower eyelid twice daily for 7 days.  (Patient not taking: Reported on 09/25/2022) 3.5 g 0   glucose blood (ACCU-CHEK GUIDE) test strip Use to check blood sugar three times daily. 100 each 2   insulin glargine (LANTUS SOLOSTAR) 100 UNIT/ML Solostar Pen Inject 36 Units into the skin daily. (Patient not taking: Reported on 09/25/2022) 15 mL 1   Insulin Pen Needle (PENTIPS) 32G X 4 MM MISC Use as directed (Patient not taking: Reported on 09/25/2022) 100 each 1   loperamide (IMODIUM) 2 MG capsule Take by mouth. (Patient not taking: Reported on 08/10/2021)     traZODone (DESYREL) 50 MG tablet Take 0.5-1 tablets (25-50 mg total) by mouth at bedtime as needed for sleep. (Patient not taking: Reported on 09/21/2022) 90 tablet 0   valsartan-hydrochlorothiazide (DIOVAN-HCT) 320-25 MG tablet Take 1 tablet by mouth daily. 90 tablet 1   No current facility-administered medications on file prior to visit.    Objective:   Vitals:   11/07/22 1613 11/07/22 1650  BP: (!) 141/89 124/82  Pulse: 83   Resp: 16   SpO2: 98%   Weight: 259 lb (117.5 kg)     Comprehensive ROS Pertinent positive and negative noted in HPI   Exam General appearance : Awake, alert, not in any distress. Speech Clear. Not toxic looking HEENT: Atraumatic and Normocephalic, pupils equally reactive to light and  accomodation Neck: Supple, no JVD. No cervical lymphadenopathy.  Chest: Good air entry bilaterally, no added sounds  CVS: S1 S2 regular, no murmurs.  Abdomen: Bowel sounds present, Non tender and not distended with no gaurding, rigidity or rebound. Extremities: B/L Lower Ext shows no edema, both legs are warm to touch Neurology: Awake alert, and oriented X 3, CN II-XII intact, Non focal Skin: No Rash  Data Review Lab Results  Component Value Date   HGBA1C 8.6 (A) 09/21/2022   HGBA1C 8.8 (A) 06/20/2022   HGBA1C 10.4 (A) 03/20/2022    Assessment & Plan   Nathaniel Spears was seen today for rash and nevus.  Diagnoses and all orders for this visit: Nathaniel Spears was seen today  for rash and nevus.  Diagnoses and all orders for this visit:  Change in color of skin mole  -     Ambulatory referral to Dermatology  Eczema, unspecified type Rash error in loading Use lotion cream moisturizing lotion     Patient have been counseled extensively about nutrition and exercise. Other issues discussed during this visit include: low cholesterol diet, weight control and daily exercise, foot care, annual eye examinations at Ophthalmology, importance of adherence with medications and regular follow-up. We also discussed long term complications of uncontrolled diabetes and hypertension.   Return in about 2 months (around 01/07/2023).  The patient was given clear instructions to go to ER or return to medical center if symptoms don't improve, worsen or new problems develop. The patient verbalized understanding. The patient was told to call to get lab results if they haven't heard anything in the next week.   This note has been created with Education officer, environmental. Any transcriptional errors are unintentional.   Grayce Sessions, NP 11/07/2022, 4:55 PM

## 2022-11-08 ENCOUNTER — Other Ambulatory Visit: Payer: Self-pay

## 2022-11-14 ENCOUNTER — Other Ambulatory Visit: Payer: Self-pay

## 2022-11-20 ENCOUNTER — Other Ambulatory Visit: Payer: Self-pay | Admitting: Family Medicine

## 2022-11-21 MED ORDER — AMLODIPINE BESYLATE 10 MG PO TABS
10.0000 mg | ORAL_TABLET | Freq: Every day | ORAL | 0 refills | Status: DC
  Filled 2022-11-21: qty 90, 90d supply, fill #0

## 2022-11-21 NOTE — Telephone Encounter (Signed)
Requested Prescriptions  Pending Prescriptions Disp Refills   amLODipine (NORVASC) 10 MG tablet 90 tablet 0    Sig: Take 1 tablet (10 mg total) by mouth daily.     Cardiovascular: Calcium Channel Blockers 2 Passed - 11/20/2022 10:24 PM      Passed - Last BP in normal range    BP Readings from Last 1 Encounters:  11/07/22 124/82         Passed - Last Heart Rate in normal range    Pulse Readings from Last 1 Encounters:  11/07/22 83         Passed - Valid encounter within last 6 months    Recent Outpatient Visits           2 weeks ago Change in color of skin mole   [place] [facility] Grayce Sessions, NP   2 months ago Encounter for immunization   [place] [facility] Grayce Sessions, NP   5 months ago Type 2 diabetes mellitus with hyperglycemia, with long-term current use of insulin [facility])   [place] [facility] Grayce Sessions, NP   5 months ago Essential hypertension   [facility] [place] Drucilla Chalet, RPH-CPP   7 months ago Essential hypertension   [facility] [place] Drucilla Chalet, RPH-CPP       Future Appointments             In 1 month Randa Evens, Kinnie Scales, NP [place] [facility]   In 1 month Randa Evens, Kinnie Scales, NP [place] [facility]

## 2022-11-22 ENCOUNTER — Other Ambulatory Visit: Payer: Self-pay

## 2022-11-23 ENCOUNTER — Other Ambulatory Visit: Payer: Self-pay

## 2022-12-08 ENCOUNTER — Ambulatory Visit (INDEPENDENT_AMBULATORY_CARE_PROVIDER_SITE_OTHER): Payer: Medicaid Other | Admitting: Nurse Practitioner

## 2022-12-08 ENCOUNTER — Encounter: Payer: Self-pay | Admitting: Nurse Practitioner

## 2022-12-08 ENCOUNTER — Other Ambulatory Visit: Payer: Self-pay

## 2022-12-08 VITALS — BP 106/64 | HR 77 | Ht 72.0 in | Wt 257.4 lb

## 2022-12-08 DIAGNOSIS — Z1211 Encounter for screening for malignant neoplasm of colon: Secondary | ICD-10-CM | POA: Diagnosis not present

## 2022-12-08 MED ORDER — NA SULFATE-K SULFATE-MG SULF 17.5-3.13-1.6 GM/177ML PO SOLN
1.0000 | Freq: Once | ORAL | 0 refills | Status: AC
Start: 2022-12-08 — End: 2022-12-19
  Filled 2022-12-08 – 2022-12-11 (×2): qty 354, 1d supply, fill #0

## 2022-12-08 NOTE — Progress Notes (Signed)
ASSESSMENT    Brief Narrative:  47 y.o.  male  with a past medical history not limited to multiple CVAs, DM2, obesity, HTN. He was seen in office early Sept 2024 for colon cancer screening but BP was too elevated to proceed. He was advised to follow up with PCP and return when BP improved  Colon cancer screening. No FMH of colon cancer. No alarm symptoms   HTN.  BP normal today after recent medication adjustments. 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 Girlfriend ( caretaker) here today and confirms he has not been on plavix for at least a year. Was also taken off asa.    History of DM.    On insulin /Jardiance  See PMH below for additional history  PLAN   --Schedule for a screening colonoscopy. The risks and benefits of colonoscopy with possible polypectomy / biopsies were discussed and the patient agrees to proceed.   HPI   Chief complaint : None. Needs screening colonoscopy   Nathaniel Spears was here in Sept to be evaluated for screening colonoscopy.  At the time of that visit his blood pressure was elevated.  We did not proceed with scheduling him for a colonoscopy.  He was advised to follow-up with PCP for elevated BP and return to here with BP improved . He did see PCP after our Sept visit and BP meds adjusted. Since then BP much better. At home BP ranging from 116 -120 / 80.    He has no GI complaints. Specifically, no constipation, diarrhea or blood in stool.   Patient accompanied by girlfriend ( caretaker). She confirms he has not taken plavix in at least a year.    Past Medical History:  Diagnosis Date   Diabetes mellitus without complication (HCC)    Hypertension    Stroke [facility])     Past Surgical History:  Procedure Laterality Date   BUBBLE STUDY  06/01/2020   Procedure: BUBBLE STUDY;  Surgeon: Little Ishikawa, MD;  Location: [facility] ENDOSCOPY;  Service: Cardiovascular;;   IR ANGIO INTRA EXTRACRAN SEL COM  CAROTID INNOMINATE BILAT MOD SED  06/02/2020   IR ANGIO VERTEBRAL SEL SUBCLAVIAN INNOMINATE BILAT MOD SED  06/02/2020   TEE WITHOUT CARDIOVERSION N/A 06/01/2020   Procedure: TRANSESOPHAGEAL ECHOCARDIOGRAM (TEE);  Surgeon: Little Ishikawa, MD;  Location[facility] ENDOSCOPY;  Service: Cardiovascular;  Laterality: N/A;    Family History  Problem Relation Age of Onset   Stroke Neg Hx     Current Medications, Allergies, Family History and Social History were reviewed in [employer] record.     Current Outpatient Medications  Medication Sig Dispense Refill   Accu-Chek Softclix Lancets lancets Use to check blood sugar three times daily. 100 each 2   amLODipine (NORVASC) 10 MG tablet Take 1 tablet (10 mg total) by mouth daily. 90 tablet 0   Blood Glucose Monitoring Suppl (ACCU-CHEK GUIDE) w/Device KIT Use to check blood sugar three times daily. 1 kit 0   Blood Pressure Monitoring (BLOOD PRESSURE CUFF) MISC Use to check blood pressure once daily. 1 each 0   carvedilol (COREG) 6.25 MG tablet Take 1 tablet (6.25 mg total) by mouth 2 (two) times daily with a meal. 180 tablet 0   empagliflozin (JARDIANCE) 25 MG TABS tablet Take 1 tablet (25 mg total) by mouth daily before breakfast. 90 tablet 1   glucose blood (ACCU-CHEK GUIDE) test strip Use to check  blood sugar three times daily. 100 each 2   insulin glargine (LANTUS SOLOSTAR) 100 UNIT/ML Solostar Pen Inject 36 Units into the skin daily. 15 mL 1   Insulin Pen Needle (PENTIPS) 32G X 4 MM MISC Use as directed 100 each 1   traZODone (DESYREL) 50 MG tablet Take 0.5-1 tablets (25-50 mg total) by mouth at bedtime as needed for sleep. 90 tablet 0   valsartan-hydrochlorothiazide (DIOVAN-HCT) 320-25 MG tablet Take 1 tablet by mouth daily. 90 tablet 1   No current facility-administered medications for this visit.    Review of Systems: No chest pain. No shortness of breath. No urinary complaints.    Physical Exam  Filed Weights    12/08/22 1013  Weight: 257 lb 6 oz (116.7 kg)   Wt Readings from Last 3 Encounters:  12/08/22 257 lb 6 oz (116.7 kg)  11/07/22 259 lb (117.5 kg)  10/05/22 259 lb (117.5 kg)    BP 106/64   Pulse 77   Ht 6' (1.829 m)   Wt 257 lb 6 oz (116.7 kg)   SpO2 96%   BMI 34.91 kg/m  Constitutional:  Pleasant, generally well appearing male in no acute distress. Psychiatric: Normal mood and affect. Behavior is normal. EENT: Pupils normal.  Conjunctivae are normal. No scleral icterus. Neck supple.  Cardiovascular: Normal rate, regular rhythm.  Pulmonary/chest: Effort normal and breath sounds normal. No wheezing, rales or rhonchi. Abdominal: Soft, nondistended, nontender. Bowel sounds active throughout. There are no masses palpable. No hepatomegaly. Neurological: Alert and oriented to person place and time.   Willette Cluster, NP  12/08/2022, 10:39 AM  Cc:  Grayce Sessions, NP

## 2022-12-08 NOTE — Progress Notes (Signed)
I agree with the assessment and plan as outlined by Ms. Guenther.

## 2022-12-08 NOTE — Patient Instructions (Addendum)
You have been scheduled for a colonoscopy. Please follow written instructions given to you at your visit today.   Please pick up your prep supplies at the pharmacy within the next 1-3 days.  If you use inhalers (even only as needed), please bring them with you on the day of your procedure.   _  _   ORAL DIABETIC MEDICATION INSTRUCTIONS  The day before your procedure:  Take your diabetic pill as you do normally  The day of your procedure:  Do not take your diabetic pill   We will check your blood sugar levels during the admission process and again in Recovery before discharging you home  ______________________________________________________________________  _  _   INSULIN (LONG ACTING) MEDICATION INSTRUCTIONS (Lantus, NPH, 70/30, Humulin, Novolin-N, Levemir, Toujeo, Johann Capers )   The day before your procedure:  Take  your regular evening dose    The day of your procedure:  Do not take your morning dose   _  _   INSULIN (SHORT ACTING) MEDICATION INSTRUCTIONS (Regular, Humulog, Novolog, Apidra, Novolin, Humulin)   The day before your procedure:  Do not take your evening dose   The day of your procedure:  Do not take your morning dose   Due to recent changes in healthcare laws, you may see the results of your imaging and laboratory studies on MyChart before your provider has had a chance to review them.  We understand that in some cases there may be results that are confusing or concerning to you. Not all laboratory results come back in the same time frame and the provider may be waiting for multiple results in order to interpret others.  Please give [place] 48 hours in order for your provider to thoroughly review all the results before contacting the office for clarification of your results.   Thank you for trusting me with your gastrointestinal care!

## 2022-12-11 ENCOUNTER — Other Ambulatory Visit: Payer: Self-pay

## 2022-12-18 ENCOUNTER — Other Ambulatory Visit: Payer: Self-pay

## 2022-12-20 ENCOUNTER — Encounter: Payer: Self-pay | Admitting: Internal Medicine

## 2022-12-20 ENCOUNTER — Telehealth (INDEPENDENT_AMBULATORY_CARE_PROVIDER_SITE_OTHER): Payer: Self-pay | Admitting: Primary Care

## 2022-12-20 ENCOUNTER — Ambulatory Visit: Payer: Medicaid Other | Admitting: Internal Medicine

## 2022-12-20 VITALS — BP 111/69 | HR 73 | Temp 97.9°F | Resp 14 | Ht 72.0 in | Wt 257.0 lb

## 2022-12-20 DIAGNOSIS — Z1211 Encounter for screening for malignant neoplasm of colon: Secondary | ICD-10-CM

## 2022-12-20 DIAGNOSIS — D125 Benign neoplasm of sigmoid colon: Secondary | ICD-10-CM | POA: Diagnosis not present

## 2022-12-20 DIAGNOSIS — K648 Other hemorrhoids: Secondary | ICD-10-CM

## 2022-12-20 DIAGNOSIS — I10 Essential (primary) hypertension: Secondary | ICD-10-CM | POA: Diagnosis not present

## 2022-12-20 DIAGNOSIS — E119 Type 2 diabetes mellitus without complications: Secondary | ICD-10-CM | POA: Diagnosis not present

## 2022-12-20 MED ORDER — SODIUM CHLORIDE 0.9 % IV SOLN: 500.0000 mL | Freq: Once | INTRAVENOUS | Status: DC

## 2022-12-20 NOTE — Op Note (Signed)
[facility] Patient Name: Nathaniel Spears Procedure Date: 12/20/2022 9:30 AM MRN: [id number] Endoscopist: Particia Lather , , [id number] Age: 47 Referring MD:  Date of Birth: 01-27-1975 Gender: Male Account #: [id number] Procedure:                Colonoscopy Indications:              Screening for colorectal malignant neoplasm, This                            is the patient's first colonoscopy Medicines:                Monitored Anesthesia Care Procedure:                Pre-Anesthesia Assessment:                           - Prior to the procedure, a History and Physical                            was performed, and patient medications and                            allergies were reviewed. The patient's tolerance of                            previous anesthesia was also reviewed. The risks                            and benefits of the procedure and the sedation                            options and risks were discussed with the patient.                            All questions were answered, and informed consent                            was obtained. Prior Anticoagulants: The patient has                            taken no anticoagulant or antiplatelet agents. ASA                            Grade Assessment: II - A patient with mild systemic                            disease. After reviewing the risks and benefits,                            the patient was deemed in satisfactory condition to                            undergo the procedure.  After obtaining informed consent, the colonoscope                            was passed under direct vision. Throughout the                            procedure, the patient's blood pressure, pulse, and                            oxygen saturations were monitored continuously. The                            CF [id number] #[id number] was introduced through the anus                            and advanced to the  the terminal ileum. The                            colonoscopy was performed without difficulty. The                            patient tolerated the procedure well. The quality                            of the bowel preparation was good. The terminal                            ileum, ileocecal valve, appendiceal orifice, and                            rectum were photographed. Scope In: 9:34:26 AM Scope Out: 9:50:58 AM Scope Withdrawal Time: 0 hours 14 minutes 7 seconds  Total Procedure Duration: 0 hours 16 minutes 32 seconds  Findings:                 The terminal ileum appeared normal.                           A 4 mm polyp was found in the sigmoid colon. The                            polyp was sessile. The polyp was removed with a                            cold snare. Resection and retrieval were complete.                           Non-bleeding internal hemorrhoids were found during                            retroflexion. Complications:            No immediate complications. Estimated Blood Loss:     Estimated blood loss was minimal. Impression:               -  The examined portion of the ileum was normal.                           - One 4 mm polyp in the sigmoid colon, removed with                            a cold snare. Resected and retrieved.                           - Non-bleeding internal hemorrhoids. Recommendation:           - Discharge patient to home (with escort).                           - Await pathology results.                           - The findings and recommendations were discussed                            with the patient. Dr Particia Lather "[name]" [name],  12/20/2022 9:54:43 AM

## 2022-12-20 NOTE — Progress Notes (Signed)
Called to room to assist during endoscopic procedure.  Patient ID and intended procedure confirmed with present staff. Received instructions for my participation in the procedure from the performing physician.  

## 2022-12-20 NOTE — Progress Notes (Signed)
Sedate, gd SR, tolerated procedure well, VSS, report to RN 

## 2022-12-20 NOTE — Patient Instructions (Signed)
Discharge instructions given. Handouts on polyps and Hemorrhoids. Resume previous medications. YOU HAD AN ENDOSCOPIC PROCEDURE TODAY AT THE [facility]:   Refer to the procedure report that was given to you for any specific questions about what was found during the examination.  If the procedure report does not answer your questions, please call your gastroenterologist to clarify.  If you requested that your care partner not be given the details of your procedure findings, then the procedure report has been included in a sealed envelope for you to review at your convenience later.  YOU SHOULD EXPECT: Some feelings of bloating in the abdomen. Passage of more gas than usual.  Walking can help get rid of the air that was put into your GI tract during the procedure and reduce the bloating. If you had a lower endoscopy (such as a colonoscopy or flexible sigmoidoscopy) you may notice spotting of blood in your stool or on the toilet paper. If you underwent a bowel prep for your procedure, you may not have a normal bowel movement for a few days.  Please Note:  You might notice some irritation and congestion in your nose or some drainage.  This is from the oxygen used during your procedure.  There is no need for concern and it should clear up in a day or so.  SYMPTOMS TO REPORT IMMEDIATELY:  Following lower endoscopy (colonoscopy or flexible sigmoidoscopy):  Excessive amounts of blood in the stool  Significant tenderness or worsening of abdominal pains  Swelling of the abdomen that is new, acute  Fever of 100F or higher   For urgent or emergent issues, a gastroenterologist can be reached at any hour by calling [phone]. Do not use MyChart messaging for urgent concerns.    DIET:  We do recommend a small meal at first, but then you may proceed to your regular diet.  Drink plenty of fluids but you should avoid alcoholic beverages for 24 hours.  ACTIVITY:  You should plan to take it  easy for the rest of today and you should NOT DRIVE or use heavy machinery until tomorrow (because of the sedation medicines used during the test).    FOLLOW UP: Our staff will call the number listed on your records the next business day following your procedure.  We will call around 7:15- 8:00 am to check on you and address any questions or concerns that you may have regarding the information given to you following your procedure. If we do not reach you, we will leave a message.     If any biopsies were taken you will be contacted by phone or by letter within the next 1-3 weeks.  Please call us at [phone] if you have not heard about the biopsies in 3 weeks.    SIGNATURES/CONFIDENTIALITY: You and/or your care partner have signed paperwork which will be entered into your electronic medical record.  These signatures attest to the fact that that the information above on your After Visit Summary has been reviewed and is understood.  Full responsibility of the confidentiality of this discharge information lies with you and/or your care-partner. 

## 2022-12-20 NOTE — Progress Notes (Signed)
GASTROENTEROLOGY PROCEDURE H&P NOTE   Primary Care Physician: Grayce Sessions, NP    Reason for Procedure:   Colon cancer screening  Plan:    Colonoscopy  Patient is appropriate for endoscopic procedure(s) in the ambulatory ([facility]) setting.  The nature of the procedure, as well as the risks, benefits, and alternatives were carefully and thoroughly reviewed with the patient. Ample time for discussion and questions allowed. The patient understood, was satisfied, and agreed to proceed.     HPI: Nathaniel Spears is a 47 y.o. male who presents for colonoscopy for colon cancer screening. Denies blood in stools, changes in bowel habits, or unintentional weight loss. Denies family history of colon cancer.  Past Medical History:  Diagnosis Date   Diabetes mellitus without complication (HCC)    Hypertension    Stroke [facility])     Past Surgical History:  Procedure Laterality Date   BUBBLE STUDY  06/01/2020   Procedure: BUBBLE STUDY;  Surgeon: Little Ishikawa, MD;  Location: [facility] ENDOSCOPY;  Service: Cardiovascular;;   IR ANGIO INTRA EXTRACRAN SEL COM CAROTID INNOMINATE BILAT MOD SED  06/02/2020   IR ANGIO VERTEBRAL SEL SUBCLAVIAN INNOMINATE BILAT MOD SED  06/02/2020   TEE WITHOUT CARDIOVERSION N/A 06/01/2020   Procedure: TRANSESOPHAGEAL ECHOCARDIOGRAM (TEE);  Surgeon: Little Ishikawa, MD;  Location: [facility] ENDOSCOPY;  Service: Cardiovascular;  Laterality: N/A;    Prior to Admission medications   Medication Sig Start Date End Date Taking? Authorizing Provider  Accu-Chek Softclix Lancets lancets Use to check blood sugar three times daily. 08/10/21   Hoy Register, MD  amLODipine (NORVASC) 10 MG tablet Take 1 tablet (10 mg total) by mouth daily. 11/21/22   Grayce Sessions, NP  Blood Glucose Monitoring Suppl (ACCU-CHEK GUIDE) w/Device KIT Use to check blood sugar three times daily. 08/10/21   Hoy Register, MD  Blood Pressure Monitoring (BLOOD PRESSURE CUFF) MISC Use to check  blood pressure once daily. 10/13/21   Hoy Register, MD  carvedilol (COREG) 6.25 MG tablet Take 1 tablet (6.25 mg total) by mouth 2 (two) times daily with a meal. 09/25/22   Grayce Sessions, NP  empagliflozin (JARDIANCE) 25 MG TABS tablet Take 1 tablet (25 mg total) by mouth daily before breakfast. 06/23/22   Grayce Sessions, NP  glucose blood (ACCU-CHEK GUIDE) test strip Use to check blood sugar three times daily. 07/14/22   Grayce Sessions, NP  insulin glargine (LANTUS SOLOSTAR) 100 UNIT/ML Solostar Pen Inject 36 Units into the skin daily. 09/21/22   Grayce Sessions, NP  Insulin Pen Needle (PENTIPS) 32G X 4 MM MISC Use as directed 04/25/22   Hoy Register, MD  traZODone (DESYREL) 50 MG tablet Take 0.5-1 tablets (25-50 mg total) by mouth at bedtime as needed for sleep. 04/26/22   Grayce Sessions, NP  valsartan-hydrochlorothiazide (DIOVAN-HCT) 320-25 MG tablet Take 1 tablet by mouth daily. 10/27/22   Grayce Sessions, NP    Current Outpatient Medications  Medication Sig Dispense Refill   Accu-Chek Softclix Lancets lancets Use to check blood sugar three times daily. 100 each 2   amLODipine (NORVASC) 10 MG tablet Take 1 tablet (10 mg total) by mouth daily. 90 tablet 0   Blood Glucose Monitoring Suppl (ACCU-CHEK GUIDE) w/Device KIT Use to check blood sugar three times daily. 1 kit 0   Blood Pressure Monitoring (BLOOD PRESSURE CUFF) MISC Use to check blood pressure once daily. 1 each 0   carvedilol (COREG) 6.25 MG tablet Take 1 tablet (6.25 mg  total) by mouth 2 (two) times daily with a meal. 180 tablet 0   empagliflozin (JARDIANCE) 25 MG TABS tablet Take 1 tablet (25 mg total) by mouth daily before breakfast. 90 tablet 1   glucose blood (ACCU-CHEK GUIDE) test strip Use to check blood sugar three times daily. 100 each 2   insulin glargine (LANTUS SOLOSTAR) 100 UNIT/ML Solostar Pen Inject 36 Units into the skin daily. 15 mL 1   Insulin Pen Needle (PENTIPS) 32G X 4 MM MISC Use as  directed 100 each 1   traZODone (DESYREL) 50 MG tablet Take 0.5-1 tablets (25-50 mg total) by mouth at bedtime as needed for sleep. 90 tablet 0   valsartan-hydrochlorothiazide (DIOVAN-HCT) 320-25 MG tablet Take 1 tablet by mouth daily. 90 tablet 1   Current Facility-Administered Medications  Medication Dose Route Frequency Provider Last Rate Last Admin   0.9 %  sodium chloride infusion  500 mL Intravenous Once Imogene Burn, MD        Allergies as of 12/20/2022 - Review Complete 12/20/2022  Allergen Reaction Noted   Milk-related compounds  06/24/2020    Family History  Problem Relation Age of Onset   Stroke Neg Hx     Social History   Socioeconomic History   Marital status: Single    Spouse name: Not on file   Number of children: 2   Years of education: Not on file   Highest education level: Bachelor's degree (e.g., BA, AB, BS)  Occupational History   Occupation: disable  Tobacco Use   Smoking status: Former    Types: Cigarettes   Smokeless tobacco: Never  Vaping Use   Vaping status: Never Used  Substance and Sexual Activity   Alcohol use: Not Currently   Drug use: Never   Sexual activity: Not on file  Other Topics Concern   Not on file  Social History Narrative   Not on file   Social Determinants of Health   Financial Resource Strain: Low Risk  (12/19/2022)   Overall Financial Resource Strain (CARDIA)    Difficulty of Paying Living Expenses: Not hard at all  Food Insecurity: No Food Insecurity (12/19/2022)   Hunger Vital Sign    Worried About Running Out of Food in the Last Year: Never true    Ran Out of Food in the Last Year: Never true  Transportation Needs: No Transportation Needs (12/19/2022)   PRAPARE - Administrator, Civil Service (Medical): No    Lack of Transportation (Non-Medical): No  Physical Activity: Inactive (12/19/2022)   Exercise Vital Sign    Days of Exercise per Week: 0 days    Minutes of Exercise per Session: 0 min  Stress: No  Stress Concern Present (12/19/2022)   [employer] of Occupational Health - Occupational Stress Questionnaire    Feeling of Stress : Only a little  Social Connections: Socially Integrated (12/19/2022)   Social Connection and Isolation Panel [NHANES]    Frequency of Communication with Friends and Family: Three times a week    Frequency of Social Gatherings with Friends and Family: Once a week    Attends Religious Services: More than 4 times per year    Active Member of [employer] or Organizations: Yes    Attends Banker Meetings: More than 4 times per year    Marital Status: Living with partner  Intimate Partner Violence: Not At Risk (05/26/2022)   Humiliation, Afraid, Rape, and Kick questionnaire    Fear of Current or Ex-Partner: No  Emotionally Abused: No    Physically Abused: No    Sexually Abused: No    Physical Exam: Vital signs in last 24 hours: BP 105/72   Pulse 72   Temp 97.9 F (36.6 C) (Temporal)   Ht 6' (1.829 m)   Wt 257 lb (116.6 kg)   SpO2 98%   BMI 34.86 kg/m  GEN: NAD EYE: Sclerae anicteric ENT: MMM CV: Non-tachycardic Pulm: No increased work of breathing GI: Soft, NT/ND NEURO:  Alert & Oriented   Eulah Pont, MD [facility]  12/20/2022 8:43 AM

## 2022-12-20 NOTE — Progress Notes (Signed)
[name] states no medical or surgical changes since previsit or office visit. 

## 2022-12-20 NOTE — Telephone Encounter (Signed)
Called pt to confirm apt. VM left with pt.

## 2022-12-21 ENCOUNTER — Encounter (INDEPENDENT_AMBULATORY_CARE_PROVIDER_SITE_OTHER): Payer: Self-pay | Admitting: Primary Care

## 2022-12-21 ENCOUNTER — Telehealth: Payer: Self-pay

## 2022-12-21 ENCOUNTER — Ambulatory Visit (INDEPENDENT_AMBULATORY_CARE_PROVIDER_SITE_OTHER): Payer: Medicaid Other | Admitting: Primary Care

## 2022-12-21 VITALS — BP 122/78 | HR 74 | Resp 16 | Wt 257.4 lb

## 2022-12-21 DIAGNOSIS — I10 Essential (primary) hypertension: Secondary | ICD-10-CM | POA: Diagnosis not present

## 2022-12-21 DIAGNOSIS — Z1159 Encounter for screening for other viral diseases: Secondary | ICD-10-CM

## 2022-12-21 DIAGNOSIS — E1165 Type 2 diabetes mellitus with hyperglycemia: Secondary | ICD-10-CM

## 2022-12-21 DIAGNOSIS — Z23 Encounter for immunization: Secondary | ICD-10-CM

## 2022-12-21 DIAGNOSIS — Z794 Long term (current) use of insulin: Secondary | ICD-10-CM

## 2022-12-21 LAB — POCT GLYCOSYLATED HEMOGLOBIN (HGB A1C): HbA1c, POC (controlled diabetic range): 7.9 % — AB (ref 0.0–7.0)

## 2022-12-21 NOTE — Progress Notes (Signed)
[facility]  Nathaniel Spears, is a 47 y.o. male  NFA:[id number]  ION:[id number]  DOB - June 24, 1975  Chief Complaint  Patient presents with   Diabetes   Hypertension       Subjective:   Nathaniel Spears is a 47 y.o. male here today for a follow up visit. Patient has No headache, No chest pain, No abdominal pain - No Nausea, No new weakness tingling or numbness, No Cough - shortness of breath Diabetes He presents for his follow-up diabetic visit. He has type 2 diabetes mellitus. His disease course has been improving. There are no hypoglycemic associated symptoms. There are no diabetic associated symptoms. There are no hypoglycemic complications. Symptoms are stable. Diabetic complications include a CVA. Current diabetic treatment includes insulin injections and oral agent (dual therapy). He is compliant with treatment all of the time. There is no change in his home blood glucose trend. His breakfast blood glucose is taken between 7-8 am. His breakfast blood glucose range is generally 110-130 mg/dl. His bedtime blood glucose is taken between 10-11 pm. His bedtime blood glucose range is generally 140-180 mg/dl.  Hypertension Hypertensive end-organ damage includes CVA.   No problems updated.  Comprehensive ROS Pertinent positive and negative noted in HPI   Allergies  Allergen Reactions   Milk-Related Compounds     Past Medical History:  Diagnosis Date   Diabetes mellitus without complication (HCC)    Hypertension    Stroke [facility])     Current Outpatient Medications on File Prior to Visit  Medication Sig Dispense Refill   Accu-Chek Softclix Lancets lancets Use to check blood sugar three times daily. 100 each 2   amLODipine (NORVASC) 10 MG tablet Take 1 tablet (10 mg total) by mouth daily. 90 tablet 0   Blood Glucose Monitoring Suppl (ACCU-CHEK GUIDE) w/Device KIT Use to check blood sugar three times daily. 1 kit 0   Blood Pressure Monitoring (BLOOD PRESSURE CUFF) MISC  Use to check blood pressure once daily. 1 each 0   carvedilol (COREG) 6.25 MG tablet Take 1 tablet (6.25 mg total) by mouth 2 (two) times daily with a meal. 180 tablet 0   empagliflozin (JARDIANCE) 25 MG TABS tablet Take 1 tablet (25 mg total) by mouth daily before breakfast. 90 tablet 1   glucose blood (ACCU-CHEK GUIDE) test strip Use to check blood sugar three times daily. 100 each 2   insulin glargine (LANTUS SOLOSTAR) 100 UNIT/ML Solostar Pen Inject 36 Units into the skin daily. 15 mL 1   Insulin Pen Needle (PENTIPS) 32G X 4 MM MISC Use as directed 100 each 1   traZODone (DESYREL) 50 MG tablet Take 0.5-1 tablets (25-50 mg total) by mouth at bedtime as needed for sleep. 90 tablet 0   valsartan-hydrochlorothiazide (DIOVAN-HCT) 320-25 MG tablet Take 1 tablet by mouth daily. 90 tablet 1   Current Facility-Administered Medications on File Prior to Visit  Medication Dose Route Frequency Provider Last Rate Last Admin   0.9 %  sodium chloride infusion  500 mL Intravenous Once Imogene Burn, MD       Health Maintenance  Topic Date Due   Eye exam for diabetics  Never done   COVID-19 Vaccine (3 - 2023-24 season) 09/17/2022   Yearly kidney health urinalysis for diabetes  06/20/2023   Complete foot exam   06/20/2023   Hemoglobin A1C  06/21/2023   Yearly kidney function blood test for diabetes  12/21/2023   Colon Cancer Screening  12/19/2032   DTaP/Tdap/Td vaccine (2 -  Td or Tdap) 12/20/2032   Flu Shot  Completed   Hepatitis C Screening  Completed   HIV Screening  Completed   HPV Vaccine  Aged Out    Objective:   Vitals:   12/21/22 1025  BP: 122/78  Pulse: 74  Resp: 16  SpO2: 98%  Weight: 257 lb 6.4 oz (116.8 kg)   BP Readings from Last 3 Encounters:  12/21/22 122/78  12/20/22 111/69  12/08/22 106/64      Physical Exam Vitals reviewed.  Constitutional:      Appearance: He is obese.  HENT:     Head: Normocephalic.     Right Ear: Tympanic membrane normal.     Left Ear:  Tympanic membrane normal.     Nose: Nose normal.  Eyes:     Extraocular Movements: Extraocular movements intact.  Cardiovascular:     Rate and Rhythm: Normal rate and regular rhythm.  Pulmonary:     Effort: Pulmonary effort is normal.     Breath sounds: Normal breath sounds.  Abdominal:     General: Bowel sounds are normal. There is distension.     Palpations: Abdomen is soft.  Musculoskeletal:        General: Normal range of motion.     Cervical back: Normal range of motion.  Skin:    General: Skin is warm and dry.  Neurological:     Mental Status: He is alert. Mental status is at baseline.  Psychiatric:        Mood and Affect: Mood normal.        Behavior: Behavior normal.     Assessment & Plan  [name] was seen today for diabetes and hypertension.  Diagnoses and all orders for this visit:  Type 2 diabetes mellitus with hyperglycemia, with long-term current use of insulin (HCC) -     POCT glycosylated hemoglobin (Hb A1C) -     CBC with Differential/Platelet -     Lipid panel  Encounter for immunization -     Tdap vaccine greater than or equal to 7yo IM  Encounter for HCV screening test for low risk patient -     HCV Ab w Reflex to Quant PCR -     Interpretation:  Essential hypertension -     Comprehensive metabolic panel    Patient have been counseled extensively about nutrition and exercise. Other issues discussed during this visit include: low cholesterol diet, weight control and daily exercise, foot care, annual eye examinations at Ophthalmology, importance of adherence with medications and regular follow-up. We also discussed long term complications of uncontrolled diabetes and hypertension.   Return for medical conditions, fasting labs.  The patient was given clear instructions to go to ER or return to medical center if symptoms don't improve, worsen or new problems develop. The patient verbalized understanding. The patient was told to call to get lab results  if they haven't heard anything in the next week.   This note has been created with Education officer, environmental. Any transcriptional errors are unintentional.   Grayce Sessions, NP 12/22/2022, 12:06 PM

## 2022-12-21 NOTE — Patient Instructions (Signed)
Td (Tetanus, Diphtheria) Vaccine: What You Need to Know Many vaccine information statements are available in Spanish and other languages. See PromoAge.com.br. 1. Why get vaccinated? Td vaccine can prevent tetanus and diphtheria. Tetanus enters the body through cuts or wounds. Diphtheria spreads from person to person. TETANUS (T) causes painful stiffening of the muscles. Tetanus can lead to serious health problems, including being unable to open the mouth, having trouble swallowing and breathing, or death. DIPHTHERIA (D) can lead to difficulty breathing, heart failure, paralysis, or death. 2. Td vaccine Td is only for children 7 years and older, adolescents, and adults.  Td is usually given as a booster dose every 10 years, or after 5 years in the case of a severe or dirty wound or burn. Another vaccine, called "Tdap," may be used instead of Td. Tdap protects against pertussis, also known as "whooping cough," in addition to tetanus and diphtheria. Td may be given at the same time as other vaccines. 3. Talk with your health care provider Tell your vaccination provider if the person getting the vaccine: Has had an allergic reaction after a previous dose of any vaccine that protects against tetanus or diphtheria, or has any severe, life-threatening allergies Has ever had Guillain-Barr Syndrome (also called "GBS") Has had severe pain or swelling after a previous dose of any vaccine that protects against tetanus or diphtheria In some cases, your health care provider may decide to postpone Td vaccination until a future visit. People with minor illnesses, such as a cold, may be vaccinated. People who are moderately or severely ill should usually wait until they recover before getting Td vaccine.  Your health care provider can give you more information. 4. Risks of a vaccine reaction Pain, redness, or swelling where the shot was given, mild fever, headache, feeling tired, and nausea, vomiting,  diarrhea, or stomachache sometimes happen after Td vaccination. People sometimes faint after medical procedures, including vaccination. Tell your provider if you feel dizzy or have vision changes or ringing in the ears.  As with any medicine, there is a very remote chance of a vaccine causing a severe allergic reaction, other serious injury, or death. 5. What if there is a serious problem? An allergic reaction could occur after the vaccinated person leaves the clinic. If you see signs of a severe allergic reaction (hives, swelling of the face and throat, difficulty breathing, a fast heartbeat, dizziness, or weakness), call 9-1-1 and get the person to the nearest hospital.  For other signs that concern you, call your health care provider.  Adverse reactions should be reported to the Vaccine Adverse Event Reporting System (VAERS). Your health care provider will usually file this report, or you can do it yourself. Visit the VAERS website at www.vaers.LAgents.no or call 581 417 1246. VAERS is only for reporting reactions, and VAERS staff members do not give medical advice. 6. The National Vaccine Injury Compensation Program The [employer] Vaccine Injury Compensation Program (VICP) is a federal program that was created to compensate people who may have been injured by certain vaccines. Claims regarding alleged injury or death due to vaccination have a time limit for filing, which may be as short as two years. Visit the VICP website at SpiritualWord.at or call (587)575-7293 to learn about the program and about filing a claim. 7. How can I learn more? Ask your health care provider. Call your local or state health department. Visit the website of the Food and Drug Administration (FDA) for vaccine package inserts and additional information at FinderList.no. Contact  the Centers for Disease Control and Prevention (CDC): Call 541-030-0300 (1-800-CDC-INFO) or Visit  CDC's website at PicCapture.uy. Source: CDC Vaccine Information Statement Td (Tetanus, Diphtheria) Vaccine (08/22/2019) This same material is available at FootballExhibition.com.br for no charge. This information is not intended to replace advice given to you by your health care provider. Make sure you discuss any questions you have with your health care provider. Document Revised: 04/19/2022 Document Reviewed: 02/17/2022 Elsevier Patient Education  2024 [employer].

## 2022-12-21 NOTE — Telephone Encounter (Signed)
  Follow up Call-     12/20/2022    8:20 AM  Call back number  Post procedure Call Back phone  # [phone]  Permission to leave phone message Yes     No answer

## 2022-12-22 LAB — COMPREHENSIVE METABOLIC PANEL
ALT: 17 [IU]/L (ref 0–44)
AST: 14 [IU]/L (ref 0–40)
Albumin: 4.4 g/dL (ref 4.1–5.1)
Alkaline Phosphatase: 106 [IU]/L (ref 44–121)
BUN/Creatinine Ratio: 12 (ref 9–20)
BUN: 14 mg/dL (ref 6–24)
Bilirubin Total: 0.4 mg/dL (ref 0.0–1.2)
CO2: 26 mmol/L (ref 20–29)
Calcium: 9.1 mg/dL (ref 8.7–10.2)
Chloride: 100 mmol/L (ref 96–106)
Creatinine, Ser: 1.14 mg/dL (ref 0.76–1.27)
Globulin, Total: 3.1 g/dL (ref 1.5–4.5)
Glucose: 193 mg/dL — ABNORMAL HIGH (ref 70–99)
Potassium: 3.6 mmol/L (ref 3.5–5.2)
Sodium: 140 mmol/L (ref 134–144)
Total Protein: 7.5 g/dL (ref 6.0–8.5)
eGFR: 80 mL/min/{1.73_m2} (ref >59)

## 2022-12-22 LAB — CBC WITH DIFFERENTIAL/PLATELET
Basophils Absolute: 0 10*3/uL (ref 0.0–0.2)
Basos: 1 %
EOS (ABSOLUTE): 0.1 10*3/uL (ref 0.0–0.4)
Eos: 2 %
Hematocrit: 43.7 % (ref 37.5–51.0)
Hemoglobin: 13.8 g/dL (ref 13.0–17.7)
Immature Grans (Abs): 0 10*3/uL (ref 0.0–0.1)
Immature Granulocytes: 0 %
Lymphocytes Absolute: 2.7 10*3/uL (ref 0.7–3.1)
Lymphs: 46 %
MCH: 25.2 pg — ABNORMAL LOW (ref 26.6–33.0)
MCHC: 31.6 g/dL (ref 31.5–35.7)
MCV: 80 fL (ref 79–97)
Monocytes Absolute: 0.5 10*3/uL (ref 0.1–0.9)
Monocytes: 8 %
Neutrophils Absolute: 2.5 10*3/uL (ref 1.4–7.0)
Neutrophils: 43 %
Platelets: 308 10*3/uL (ref 150–450)
RBC: 5.47 x10E6/uL (ref 4.14–5.80)
RDW: 13.3 % (ref 11.6–15.4)
WBC: 5.8 10*3/uL (ref 3.4–10.8)

## 2022-12-22 LAB — LIPID PANEL
Chol/HDL Ratio: 3.5 {ratio} (ref 0.0–5.0)
Cholesterol, Total: 115 mg/dL (ref 100–199)
HDL: 33 mg/dL — ABNORMAL LOW (ref >39)
LDL Chol Calc (NIH): 67 mg/dL (ref 0–99)
Triglycerides: 74 mg/dL (ref 0–149)
VLDL Cholesterol Cal: 15 mg/dL (ref 5–40)

## 2022-12-22 LAB — HCV AB W REFLEX TO QUANT PCR: HCV Ab: NONREACTIVE

## 2022-12-22 LAB — HCV INTERPRETATION

## 2022-12-22 LAB — SURGICAL PATHOLOGY

## 2022-12-23 ENCOUNTER — Encounter: Payer: Self-pay | Admitting: Internal Medicine

## 2022-12-27 ENCOUNTER — Other Ambulatory Visit: Payer: Self-pay

## 2022-12-27 ENCOUNTER — Other Ambulatory Visit (INDEPENDENT_AMBULATORY_CARE_PROVIDER_SITE_OTHER): Payer: Self-pay | Admitting: Primary Care

## 2022-12-27 ENCOUNTER — Other Ambulatory Visit: Payer: Self-pay | Admitting: Primary Care

## 2022-12-27 DIAGNOSIS — I10 Essential (primary) hypertension: Secondary | ICD-10-CM

## 2022-12-27 DIAGNOSIS — E1165 Type 2 diabetes mellitus with hyperglycemia: Secondary | ICD-10-CM

## 2022-12-28 ENCOUNTER — Other Ambulatory Visit: Payer: Self-pay

## 2022-12-28 MED ORDER — LANTUS SOLOSTAR 100 UNIT/ML ~~LOC~~ SOPN
36.0000 [IU] | PEN_INJECTOR | Freq: Every day | SUBCUTANEOUS | 1 refills | Status: DC
  Filled 2022-12-28 – 2023-01-15 (×2): qty 15, 41d supply, fill #0
  Filled 2023-03-21: qty 15, 41d supply, fill #1

## 2022-12-28 NOTE — Telephone Encounter (Signed)
Requested medication (s) are due for refill today: yes   Requested medication (s) are on the active medication list: yes  Last refill:  09/25/22 #180 0 refills   Future visit scheduled: no   Notes to clinic:  no refills remain . Do you want to refill Rx?     Requested Prescriptions  Pending Prescriptions Disp Refills   carvedilol (COREG) 6.25 MG tablet 180 tablet 0    Sig: Take 1 tablet (6.25 mg total) by mouth 2 (two) times daily with a meal.     Cardiovascular: Beta Blockers 3 Passed - 12/28/2022  9:01 AM      Passed - Cr in normal range and within 360 days    Creatinine, Ser  Date Value Ref Range Status  12/21/2022 1.14 0.76 - 1.27 mg/dL Final         Passed - AST in normal range and within 360 days    AST  Date Value Ref Range Status  12/21/2022 14 0 - 40 IU/L Final         Passed - ALT in normal range and within 360 days    ALT  Date Value Ref Range Status  12/21/2022 17 0 - 44 IU/L Final         Passed - Last BP in normal range    BP Readings from Last 1 Encounters:  12/21/22 122/78         Passed - Last Heart Rate in normal range    Pulse Readings from Last 1 Encounters:  12/21/22 74         Passed - Valid encounter within last 6 months    Recent Outpatient Visits           1 week ago Type 2 diabetes mellitus with hyperglycemia, with long-term current use of insulin (HCC)   [place] [facility] Grayce Sessions, NP   1 month ago Change in color of skin mole   [place] [facility] Grayce Sessions, NP   3 months ago Encounter for immunization   [place] [facility] Grayce Sessions, NP   6 months ago Type 2 diabetes mellitus with hyperglycemia, with long-term current use of insulin [facility])   [place] [facility] Grayce Sessions, NP   7 months ago Essential hypertension    [facility] [place]. [facility] Drucilla Chalet, RPH-CPP

## 2022-12-28 NOTE — Telephone Encounter (Signed)
Requested Prescriptions  Pending Prescriptions Disp Refills   insulin glargine (LANTUS SOLOSTAR) 100 UNIT/ML Solostar Pen 15 mL 1    Sig: Inject 36 Units into the skin daily.     Endocrinology:  Diabetes - Insulins Passed - 12/28/2022  8:55 AM      Passed - HBA1C is between 0 and 7.9 and within 180 days    HbA1c, POC (controlled diabetic range)  Date Value Ref Range Status  12/21/2022 7.9 (A) 0.0 - 7.0 % Final         Passed - Valid encounter within last 6 months    Recent Outpatient Visits           1 week ago Type 2 diabetes mellitus with hyperglycemia, with long-term current use of insulin (HCC)   [place] [facility] Grayce Sessions, NP   1 month ago Change in color of skin mole   [place] [facility] Grayce Sessions, NP   3 months ago Encounter for immunization   [place] [facility] Grayce Sessions, NP   6 months ago Type 2 diabetes mellitus with hyperglycemia, with long-term current use of insulin [facility])    [facility] Sessions, NP   7 months ago Essential hypertension    [facility] [place] [facility] Of [place]. [facility] Drucilla Chalet, RPH-CPP

## 2022-12-29 ENCOUNTER — Other Ambulatory Visit (INDEPENDENT_AMBULATORY_CARE_PROVIDER_SITE_OTHER): Payer: Self-pay

## 2022-12-29 ENCOUNTER — Other Ambulatory Visit: Payer: Self-pay

## 2022-12-29 MED ORDER — CARVEDILOL 6.25 MG PO TABS
6.2500 mg | ORAL_TABLET | Freq: Two times a day (BID) | ORAL | 0 refills | Status: DC
  Filled 2022-12-29: qty 180, 90d supply, fill #0

## 2023-01-08 ENCOUNTER — Ambulatory Visit (INDEPENDENT_AMBULATORY_CARE_PROVIDER_SITE_OTHER): Payer: Medicaid Other | Admitting: Primary Care

## 2023-01-08 ENCOUNTER — Other Ambulatory Visit: Payer: Self-pay

## 2023-01-08 ENCOUNTER — Other Ambulatory Visit: Payer: Self-pay | Admitting: Primary Care

## 2023-01-08 ENCOUNTER — Other Ambulatory Visit (INDEPENDENT_AMBULATORY_CARE_PROVIDER_SITE_OTHER): Payer: Self-pay

## 2023-01-08 ENCOUNTER — Other Ambulatory Visit (INDEPENDENT_AMBULATORY_CARE_PROVIDER_SITE_OTHER): Payer: Self-pay | Admitting: Primary Care

## 2023-01-08 DIAGNOSIS — I10 Essential (primary) hypertension: Secondary | ICD-10-CM

## 2023-01-08 MED ORDER — EMPAGLIFLOZIN 25 MG PO TABS
25.0000 mg | ORAL_TABLET | Freq: Every day | ORAL | 1 refills | Status: DC
  Filled 2023-01-08 – 2023-01-18 (×2): qty 90, 90d supply, fill #0
  Filled 2023-04-22: qty 90, 90d supply, fill #1

## 2023-01-12 ENCOUNTER — Other Ambulatory Visit: Payer: Self-pay

## 2023-01-12 ENCOUNTER — Other Ambulatory Visit (INDEPENDENT_AMBULATORY_CARE_PROVIDER_SITE_OTHER): Payer: Self-pay

## 2023-01-12 ENCOUNTER — Telehealth (INDEPENDENT_AMBULATORY_CARE_PROVIDER_SITE_OTHER): Payer: Self-pay | Admitting: Primary Care

## 2023-01-12 MED ORDER — ACCU-CHEK GUIDE TEST VI STRP
ORAL_STRIP | 6 refills | Status: DC
  Filled 2023-01-12: qty 100, 33d supply, fill #0
  Filled 2023-02-26: qty 100, 33d supply, fill #1
  Filled 2023-04-12: qty 100, 33d supply, fill #2
  Filled 2023-05-30: qty 100, 33d supply, fill #3
  Filled 2023-07-22: qty 100, 33d supply, fill #4
  Filled 2023-09-07: qty 100, 33d supply, fill #5
  Filled 2023-10-27: qty 100, 33d supply, fill #6

## 2023-01-12 NOTE — Telephone Encounter (Signed)
Yes ma'am, rxn sent. 

## 2023-01-12 NOTE — Telephone Encounter (Signed)
Noted  

## 2023-01-12 NOTE — Telephone Encounter (Signed)
Medication Refill -  Most Recent Primary Care Visit:  Provider: Grayce Sessions  Department: [facility]  Visit Type: OFFICE VISIT  Date: 12/21/2022  Medication: Accu Chek test strips.  Has the patient contacted their pharmacy? Yes (Agent: If no, request that the patient contact the pharmacy for the refill. If patient does not wish to contact the pharmacy document the reason why and proceed with request.) (Agent: If yes, when and what did the pharmacy advise?)  Is this the correct pharmacy for this prescription? Yes If no, delete pharmacy and type the correct one.  This is the patient's preferred pharmacy:   [facility]   Has the prescription been filled recently? No  Is the patient out of the medication? Yes  Has the patient been seen for an appointment in the last year OR does the patient have an upcoming appointment? Yes  Can we respond through MyChart? No  Agent: Please be advised that Rx refills may take up to 3 business days. We ask that you follow-up with your pharmacy.

## 2023-01-12 NOTE — Telephone Encounter (Signed)
Nathaniel Spears could you refill please tried to refill and not able to

## 2023-01-15 ENCOUNTER — Other Ambulatory Visit: Payer: Self-pay

## 2023-01-18 ENCOUNTER — Other Ambulatory Visit: Payer: Self-pay

## 2023-01-19 ENCOUNTER — Other Ambulatory Visit: Payer: Self-pay

## 2023-01-20 DIAGNOSIS — H5213 Myopia, bilateral: Secondary | ICD-10-CM | POA: Diagnosis not present

## 2023-01-29 ENCOUNTER — Other Ambulatory Visit: Payer: Self-pay

## 2023-02-08 ENCOUNTER — Other Ambulatory Visit (INDEPENDENT_AMBULATORY_CARE_PROVIDER_SITE_OTHER): Payer: Self-pay | Admitting: Primary Care

## 2023-02-08 DIAGNOSIS — F32A Depression, unspecified: Secondary | ICD-10-CM

## 2023-02-09 ENCOUNTER — Other Ambulatory Visit: Payer: Self-pay

## 2023-02-09 MED ORDER — TRAZODONE HCL 50 MG PO TABS
25.0000 mg | ORAL_TABLET | Freq: Every evening | ORAL | 0 refills | Status: DC | PRN
  Filled 2023-02-09: qty 90, 90d supply, fill #0

## 2023-02-09 NOTE — Telephone Encounter (Signed)
Requested Prescriptions  Pending Prescriptions Disp Refills   traZODone (DESYREL) 50 MG tablet 90 tablet 0    Sig: Take 0.5-1 tablets (25-50 mg total) by mouth at bedtime as needed for sleep.     Psychiatry: Antidepressants - Serotonin Modulator Passed - 02/09/2023 11:29 AM      Passed - Valid encounter within last 6 months    Recent Outpatient Visits           1 month ago Type 2 diabetes mellitus with hyperglycemia, with long-term current use of insulin (HCC)   [place] [facility] Grayce Sessions, NP   3 months ago Change in color of skin mole   [place] [facility] Grayce Sessions, NP   4 months ago Encounter for immunization   [place] [facility] Grayce Sessions, NP   7 months ago Type 2 diabetes mellitus with hyperglycemia, with long-term current use of insulin [facility])   [place] [facility] Grayce Sessions, NP   8 months ago Essential hypertension   [place] [facility] Of [place]. [facility] Drucilla Chalet, RPH-CPP

## 2023-02-19 ENCOUNTER — Other Ambulatory Visit: Payer: Self-pay | Admitting: Primary Care

## 2023-02-20 ENCOUNTER — Other Ambulatory Visit: Payer: Self-pay

## 2023-02-20 MED ORDER — AMLODIPINE BESYLATE 10 MG PO TABS
10.0000 mg | ORAL_TABLET | Freq: Every day | ORAL | 0 refills | Status: DC
  Filled 2023-02-20: qty 90, 90d supply, fill #0

## 2023-02-20 NOTE — Telephone Encounter (Signed)
 Requested Prescriptions  Pending Prescriptions Disp Refills   amLODipine  (NORVASC ) 10 MG tablet 90 tablet 0    Sig: Take 1 tablet (10 mg total) by mouth daily.     Cardiovascular: Calcium  Channel Blockers 2 Passed - 02/20/2023 12:13 PM      Passed - Last BP in normal range    BP Readings from Last 1 Encounters:  12/21/22 122/78         Passed - Last Heart Rate in normal range    Pulse Readings from Last 1 Encounters:  12/21/22 74         Passed - Valid encounter within last 6 months    Recent Outpatient Visits           2 months ago Type 2 diabetes mellitus with hyperglycemia, with long-term current use of insulin  (HCC)   [place] [facility] Celestia Rosaline SQUIBB, NP   3 months ago Change in color of skin mole   [place] [facility] Celestia Rosaline SQUIBB, NP   5 months ago Encounter for immunization   [place] [facility] Celestia Rosaline SQUIBB, NP   8 months ago Type 2 diabetes mellitus with hyperglycemia, with long-term current use of insulin  [facility])   [place] [facility] Celestia Rosaline SQUIBB, NP   9 months ago Essential hypertension   [place] [facility] [place]. [facility] Fleeta Tonia Garnette LITTIE, RPH-CPP

## 2023-02-23 DIAGNOSIS — D485 Neoplasm of uncertain behavior of skin: Secondary | ICD-10-CM | POA: Diagnosis not present

## 2023-02-26 ENCOUNTER — Other Ambulatory Visit: Payer: Self-pay

## 2023-03-22 ENCOUNTER — Other Ambulatory Visit: Payer: Self-pay

## 2023-03-27 ENCOUNTER — Other Ambulatory Visit: Payer: Self-pay | Admitting: Primary Care

## 2023-03-27 DIAGNOSIS — I10 Essential (primary) hypertension: Secondary | ICD-10-CM

## 2023-03-28 ENCOUNTER — Other Ambulatory Visit: Payer: Self-pay

## 2023-03-28 ENCOUNTER — Other Ambulatory Visit (INDEPENDENT_AMBULATORY_CARE_PROVIDER_SITE_OTHER): Payer: Self-pay

## 2023-03-28 DIAGNOSIS — I10 Essential (primary) hypertension: Secondary | ICD-10-CM

## 2023-03-28 MED ORDER — CARVEDILOL 6.25 MG PO TABS
6.2500 mg | ORAL_TABLET | Freq: Two times a day (BID) | ORAL | 0 refills | Status: DC
  Filled 2023-03-28: qty 180, 90d supply, fill #0

## 2023-03-28 MED ORDER — AMLODIPINE BESYLATE 10 MG PO TABS
10.0000 mg | ORAL_TABLET | Freq: Every day | ORAL | 0 refills | Status: DC
  Filled 2023-03-28 – 2023-05-20 (×2): qty 90, 90d supply, fill #0

## 2023-04-13 ENCOUNTER — Other Ambulatory Visit: Payer: Self-pay

## 2023-04-20 ENCOUNTER — Other Ambulatory Visit: Payer: Self-pay

## 2023-04-22 ENCOUNTER — Other Ambulatory Visit (INDEPENDENT_AMBULATORY_CARE_PROVIDER_SITE_OTHER): Payer: Self-pay | Admitting: Primary Care

## 2023-04-22 DIAGNOSIS — F32A Depression, unspecified: Secondary | ICD-10-CM

## 2023-04-23 ENCOUNTER — Other Ambulatory Visit: Payer: Self-pay

## 2023-04-24 ENCOUNTER — Other Ambulatory Visit: Payer: Self-pay

## 2023-04-24 ENCOUNTER — Other Ambulatory Visit (INDEPENDENT_AMBULATORY_CARE_PROVIDER_SITE_OTHER): Payer: Self-pay | Admitting: Primary Care

## 2023-04-24 DIAGNOSIS — I10 Essential (primary) hypertension: Secondary | ICD-10-CM

## 2023-04-24 MED ORDER — TRAZODONE HCL 50 MG PO TABS
25.0000 mg | ORAL_TABLET | Freq: Every evening | ORAL | 0 refills | Status: DC | PRN
  Filled 2023-04-24 – 2023-05-06 (×2): qty 90, 90d supply, fill #0

## 2023-04-24 NOTE — Telephone Encounter (Signed)
 Requested Prescriptions  Pending Prescriptions Disp Refills   traZODone (DESYREL) 50 MG tablet 90 tablet 0    Sig: Take 0.5-1 tablets (25-50 mg total) by mouth at bedtime as needed for sleep.     Psychiatry: Antidepressants - Serotonin Modulator Passed - 04/24/2023  9:33 AM      Passed - Valid encounter within last 6 months    Recent Outpatient Visits           4 months ago Type 2 diabetes mellitus with hyperglycemia, with long-term current use of insulin (HCC)   [place] [facility] Grayce Sessions, NP   5 months ago Change in color of skin mole   [place] [facility] Grayce Sessions, NP   7 months ago Encounter for immunization   [place] [facility] Grayce Sessions, NP   10 months ago Type 2 diabetes mellitus with hyperglycemia, with long-term current use of insulin [facility])   [place] [facility] Grayce Sessions, NP   11 months ago Essential hypertension    [facility] [place] [facility] Of [place] Drucilla Chalet, RPH-CPP

## 2023-04-25 ENCOUNTER — Other Ambulatory Visit: Payer: Self-pay

## 2023-04-25 MED ORDER — VALSARTAN-HYDROCHLOROTHIAZIDE 320-25 MG PO TABS
1.0000 | ORAL_TABLET | Freq: Every day | ORAL | 0 refills | Status: DC
Start: 2023-04-25 — End: 2023-07-05
  Filled 2023-04-25 – 2023-04-28 (×2): qty 90, 90d supply, fill #0

## 2023-04-25 NOTE — Telephone Encounter (Signed)
 Requested Prescriptions  Pending Prescriptions Disp Refills   valsartan-hydrochlorothiazide (DIOVAN-HCT) 320-25 MG tablet 90 tablet 0    Sig: Take 1 tablet by mouth daily.     Cardiovascular: ARB + Diuretic Combos Passed - 04/25/2023 12:41 PM      Passed - K in normal range and within 180 days    Potassium  Date Value Ref Range Status  12/21/2022 3.6 3.5 - 5.2 mmol/L Final         Passed - Na in normal range and within 180 days    Sodium  Date Value Ref Range Status  12/21/2022 140 134 - 144 mmol/L Final         Passed - Cr in normal range and within 180 days    Creatinine, Ser  Date Value Ref Range Status  12/21/2022 1.14 0.76 - 1.27 mg/dL Final         Passed - eGFR is 10 or above and within 180 days    GFR, Estimated  Date Value Ref Range Status  08/25/2021 >60 >60 mL/min Final    Comment:    (NOTE) Calculated using the CKD-EPI Creatinine Equation (2021)    eGFR  Date Value Ref Range Status  12/21/2022 80 >59 mL/min/1.73 Final         Passed - Patient is not pregnant      Passed - Last BP in normal range    BP Readings from Last 1 Encounters:  12/21/22 122/78         Passed - Valid encounter within last 6 months    Recent Outpatient Visits           4 months ago Type 2 diabetes mellitus with hyperglycemia, with long-term current use of insulin (HCC)   [place] [facility] Grayce Sessions, NP   5 months ago Change in color of skin mole   [place] [facility] Grayce Sessions, NP   7 months ago Encounter for immunization   [place] [facility] Grayce Sessions, NP   10 months ago Type 2 diabetes mellitus with hyperglycemia, with long-term current use of insulin [facility])   [place] [facility] Grayce Sessions, NP   11 months ago Essential hypertension   [place] [facility] Of [place]. [facility] Drucilla Chalet, RPH-CPP

## 2023-04-27 DIAGNOSIS — D485 Neoplasm of uncertain behavior of skin: Secondary | ICD-10-CM | POA: Diagnosis not present

## 2023-04-28 ENCOUNTER — Other Ambulatory Visit (HOSPITAL_BASED_OUTPATIENT_CLINIC_OR_DEPARTMENT_OTHER): Payer: Self-pay

## 2023-04-30 ENCOUNTER — Other Ambulatory Visit: Payer: Self-pay

## 2023-05-01 ENCOUNTER — Other Ambulatory Visit: Payer: Self-pay

## 2023-05-07 ENCOUNTER — Other Ambulatory Visit: Payer: Self-pay

## 2023-05-09 DIAGNOSIS — L821 Other seborrheic keratosis: Secondary | ICD-10-CM | POA: Diagnosis not present

## 2023-05-10 ENCOUNTER — Other Ambulatory Visit (INDEPENDENT_AMBULATORY_CARE_PROVIDER_SITE_OTHER): Payer: Self-pay | Admitting: Primary Care

## 2023-05-10 DIAGNOSIS — E1165 Type 2 diabetes mellitus with hyperglycemia: Secondary | ICD-10-CM

## 2023-05-11 ENCOUNTER — Other Ambulatory Visit: Payer: Self-pay

## 2023-05-11 MED ORDER — LANTUS SOLOSTAR 100 UNIT/ML ~~LOC~~ SOPN
36.0000 [IU] | PEN_INJECTOR | Freq: Every day | SUBCUTANEOUS | 1 refills | Status: DC
  Filled 2023-05-11: qty 15, 41d supply, fill #0
  Filled 2023-07-05: qty 15, 41d supply, fill #1

## 2023-05-14 ENCOUNTER — Other Ambulatory Visit: Payer: Self-pay

## 2023-05-16 ENCOUNTER — Telehealth (INDEPENDENT_AMBULATORY_CARE_PROVIDER_SITE_OTHER): Payer: Self-pay | Admitting: Primary Care

## 2023-05-16 NOTE — Telephone Encounter (Signed)
 Called pt to confirm appt. Pt will be present.

## 2023-05-21 ENCOUNTER — Other Ambulatory Visit: Payer: Self-pay

## 2023-05-22 ENCOUNTER — Other Ambulatory Visit: Payer: Self-pay

## 2023-05-24 ENCOUNTER — Encounter (INDEPENDENT_AMBULATORY_CARE_PROVIDER_SITE_OTHER): Payer: Self-pay | Admitting: Primary Care

## 2023-05-24 ENCOUNTER — Ambulatory Visit (INDEPENDENT_AMBULATORY_CARE_PROVIDER_SITE_OTHER): Admitting: Primary Care

## 2023-05-24 VITALS — BP 119/84 | HR 73 | Resp 16 | Ht 73.0 in | Wt 255.0 lb

## 2023-05-24 DIAGNOSIS — Z23 Encounter for immunization: Secondary | ICD-10-CM | POA: Diagnosis not present

## 2023-05-24 DIAGNOSIS — I10 Essential (primary) hypertension: Secondary | ICD-10-CM | POA: Diagnosis not present

## 2023-05-24 DIAGNOSIS — Z794 Long term (current) use of insulin: Secondary | ICD-10-CM | POA: Diagnosis not present

## 2023-05-24 DIAGNOSIS — I6603 Occlusion and stenosis of bilateral middle cerebral arteries: Secondary | ICD-10-CM

## 2023-05-24 DIAGNOSIS — E1165 Type 2 diabetes mellitus with hyperglycemia: Secondary | ICD-10-CM

## 2023-05-24 LAB — POCT GLYCOSYLATED HEMOGLOBIN (HGB A1C): HbA1c, POC (controlled diabetic range): 7.5 % — AB (ref 0.0–7.0)

## 2023-05-24 NOTE — Patient Instructions (Signed)
Pneumococcal Conjugate Vaccine: What You Need to Know Many vaccine information statements are available in Spanish and other languages. See PromoAge.com.br. 1. Why get vaccinated? Pneumococcal conjugate vaccine can prevent pneumococcal disease. Pneumococcal disease refers to any illness caused by pneumococcal bacteria. These bacteria can cause many types of illnesses, including pneumonia, which is an infection of the lungs. Pneumococcal bacteria are one of the most common causes of pneumonia. Besides pneumonia, pneumococcal bacteria can also cause: Ear infections Sinus infections Meningitis (infection of the tissue covering the brain and spinal cord) Bacteremia (infection of the blood) Anyone can get pneumococcal disease, but children under 13 years old, people with certain medical conditions or other risk factors, and adults 65 years or older are at the highest risk. Most pneumococcal infections are mild. However, some can result in long-term problems, such as brain damage or hearing loss. Meningitis, bacteremia, and pneumonia caused by pneumococcal disease can be fatal. 2. Pneumococcal conjugate vaccine Pneumococcal conjugate vaccine helps protect against bacteria that cause pneumococcal disease. There are three pneumococcal conjugate vaccines (PCV13, PCV15, and PCV20). The different vaccines are recommended for different people based on age and medical status. Your health care provider can help you determine which type of pneumococcal conjugate vaccine, and how many doses, you should receive. Infants and young children usually need 4 doses of pneumococcal conjugate vaccine. These doses are recommended at 2, 4, 6, and 58-29 months of age. Older children and adolescents might need pneumococcal conjugate vaccine depending on their age and medical conditions or other risk factors if they did not receive the recommended doses as infants or young children. Adults 19 through 11 years old with certain  medical conditions or other risk factors who have not already received pneumococcal conjugate vaccine should receive pneumococcal conjugate vaccine. Adults 65 years or older who have not previously received pneumococcal conjugate vaccine should receive pneumococcal conjugate vaccine. Some people with certain medical conditions are also recommended to receive pneumococcal polysaccharide vaccine (a different type of pneumococcal vaccine known as PPSV23). Some adults who have previously received a pneumococcal conjugate vaccine may be recommended to receive another pneumococcal conjugate vaccine. 3. Talk with your health care provider Tell your vaccination provider if the person getting the vaccine: Has had an allergic reaction after a previous dose of any type of pneumococcal conjugate vaccine (PCV13, PCV15, PCV20, or an earlier pneumococcal conjugate vaccine known as PCV7), or to any vaccine containing diphtheria toxoid (for example, DTaP), or has any severe, life-threatening allergies In some cases, your health care provider may decide to postpone pneumococcal conjugate vaccination until a future visit. People with minor illnesses, such as a cold, may be vaccinated. People who are moderately or severely ill should usually wait until they recover. Your health care provider can give you more information. 4. Risks of a vaccine reaction Redness, swelling, pain, or tenderness where the shot is given, and fever, loss of appetite, fussiness (irritability), feeling tired, headache, muscle aches, joint pain, and chills can happen after pneumococcal conjugate vaccination. Young children may be at increased risk for seizures caused by fever after a pneumococcal conjugate vaccine if it is administered at the same time as inactivated influenza vaccine. Ask your health care provider for more information. People sometimes faint after medical procedures, including vaccination. Tell your provider if you feel dizzy or  have vision changes or ringing in the ears. As with any medicine, there is a very remote chance of a vaccine causing a severe allergic reaction, other serious injury, or death. 5.  What if there is a serious problem? An allergic reaction could occur after the vaccinated person leaves the clinic. If you see signs of a severe allergic reaction (hives, swelling of the face and throat, difficulty breathing, a fast heartbeat, dizziness, or weakness), call 9-1-1 and get the person to the nearest hospital. For other signs that concern you, call your health care provider. Adverse reactions should be reported to the Vaccine Adverse Event Reporting System (VAERS). Your health care provider will usually file this report, or you can do it yourself. Visit the VAERS website at www.vaers.LAgents.no or call 913-439-3438. VAERS is only for reporting reactions, and VAERS staff members do not give medical advice. 6. The National Vaccine Injury Compensation Program The [employer] Vaccine Injury Compensation Program (VICP) is a federal program that was created to compensate people who may have been injured by certain vaccines. Claims regarding alleged injury or death due to vaccination have a time limit for filing, which may be as short as two years. Visit the VICP website at SpiritualWord.at or call 860 253 0829 to learn about the program and about filing a claim. 7. How can I learn more? Ask your health care provider. Call your local or state health department. Visit the website of the Food and Drug Administration (FDA) for vaccine package inserts and additional information at FinderList.no. Contact the Centers for Disease Control and Prevention (CDC): Call 4055360835 (1-800-CDC-INFO) or Visit CDC's website at PicCapture.uy. Source: CDC Vaccine Information Statement (Interim) Pneumococcal Conjugate Vaccine (05/27/2021) This same material is available at  FootballExhibition.com.br for no charge. This information is not intended to replace advice given to you by your health care provider. Make sure you discuss any questions you have with your health care provider. Document Revised: 04/19/2022 Document Reviewed: 01/23/2022 Elsevier Patient Education  2024 [employer].

## 2023-05-24 NOTE — Progress Notes (Signed)
 Subjective:  Patient ID: Nathaniel Spears, male    DOB: February 04, 1975  Age: 48 y.o. MRN: [id number]  CC:DM/HTN   Cache Jacoby presents for follow-up of diabetes. Patient does not check blood sugar at home HPI  Compliant with meds - Yes Checking CBGs? Yes  Fasting avg -   Postprandial average -  Exercising regularly? - Yes Watching carbohydrate intake? - Yes Neuropathy ? - No Hypoglycemic events - No  - Recovers with :   Pertinent ROS:  Polyuria - No Polydipsia - No Vision problems - No  Hypertension well control- Patient has No headache, No chest pain, No abdominal pain - No Nausea, No new weakness tingling or numbness, No Cough - shortness of breath  Medications as noted below. Taking them regularly without complication/adverse reaction being reported today.   History Nathaniel Spears has a past medical history of Diabetes mellitus without complication (HCC), Hypertension, and Stroke (HCC).   He has a past surgical history that includes TEE without cardioversion (N/A, 06/01/2020); Bubble study (06/01/2020); IR ANGIO INTRA EXTRACRAN SEL COM CAROTID INNOMINATE BILAT MOD SED (06/02/2020); and IR ANGIO VERTEBRAL SEL SUBCLAVIAN INNOMINATE BILAT MOD SED (06/02/2020).   His family history is not on file.He reports that he has quit smoking. His smoking use included cigarettes. He has never used smokeless tobacco. He reports that he does not currently use alcohol. He reports that he does not use drugs.  Current Outpatient Medications on File Prior to Visit  Medication Sig Dispense Refill   Accu-Chek Softclix Lancets lancets Use to check blood sugar three times daily. 100 each 2   amLODipine  (NORVASC ) 10 MG tablet Take 1 tablet (10 mg total) by mouth daily. 90 tablet 0   Blood Glucose Monitoring Suppl (ACCU-CHEK GUIDE) w/Device KIT Use to check blood sugar three times daily. 1 kit 0   Blood Pressure Monitoring (BLOOD PRESSURE CUFF) MISC Use to check blood pressure once daily. 1 each 0   carvedilol   (COREG ) 6.25 MG tablet Take 1 tablet (6.25 mg total) by mouth 2 (two) times daily with a meal. 180 tablet 0   empagliflozin  (JARDIANCE ) 25 MG TABS tablet Take 1 tablet (25 mg total) by mouth daily before breakfast. 90 tablet 1   glucose blood (ACCU-CHEK GUIDE TEST) test strip Use to check blood sugar 3 times daily. 100 each 6   insulin  glargine (LANTUS  SOLOSTAR) 100 UNIT/ML Solostar Pen Inject 36 Units into the skin daily. 15 mL 1   Insulin  Pen Needle (PENTIPS) 32G X 4 MM MISC Use as directed 100 each 1   traZODone  (DESYREL ) 50 MG tablet Take 0.5-1 tablets (25-50 mg total) by mouth at bedtime as needed for sleep. 90 tablet 0   valsartan -hydrochlorothiazide  (DIOVAN -HCT) 320-25 MG tablet Take 1 tablet by mouth daily. 90 tablet 0   Current Facility-Administered Medications on File Prior to Visit  Medication Dose Route Frequency Provider Last Rate Last Admin   0.9 %  sodium chloride  infusion  500 mL Intravenous Once Dorsey, Ying C, MD        Review of Systems Comprehensive ROS Pertinent positive and negative noted in HPI   Objective:  There were no vitals taken for this visit.  BP Readings from Last 3 Encounters:  12/21/22 122/78  12/20/22 111/69  12/08/22 106/64    Wt Readings from Last 3 Encounters:  12/21/22 257 lb 6.4 oz (116.8 kg)  12/20/22 257 lb (116.6 kg)  12/08/22 257 lb 6 oz (116.7 kg)    Physical Exam Vitals reviewed.  Constitutional:  Appearance: He is obese.  HENT:     Head: Normocephalic.     Right Ear: Tympanic membrane and external ear normal.     Left Ear: Tympanic membrane and external ear normal.     Nose: Nose normal.  Eyes:     Extraocular Movements: Extraocular movements intact.  Cardiovascular:     Rate and Rhythm: Normal rate and regular rhythm.  Pulmonary:     Effort: Pulmonary effort is normal.     Breath sounds: Normal breath sounds.  Abdominal:     General: Bowel sounds are normal. There is distension.     Palpations: Abdomen is soft.   Musculoskeletal:        General: Normal range of motion.     Cervical back: Normal range of motion and neck supple.  Skin:    General: Skin is warm and dry.  Neurological:     Mental Status: Mental status is at baseline.  Psychiatric:        Mood and Affect: Mood normal.        Behavior: Behavior normal.        Thought Content: Thought content normal.        Judgment: Judgment normal.    Lab Results  Component Value Date   HGBA1C 7.5 (A) 05/24/2023   HGBA1C 7.9 (A) 12/21/2022   HGBA1C 8.6 (A) 09/21/2022    Lab Results  Component Value Date   WBC 5.8 12/21/2022   HGB 13.8 12/21/2022   HCT 43.7 12/21/2022   PLT 308 12/21/2022   GLUCOSE 193 (H) 12/21/2022   CHOL 115 12/21/2022   TRIG 74 12/21/2022   HDL 33 (L) 12/21/2022   LDLCALC 67 12/21/2022   ALT 17 12/21/2022   AST 14 12/21/2022   NA 140 12/21/2022   K 3.6 12/21/2022   CL 100 12/21/2022   CREATININE 1.14 12/21/2022   BUN 14 12/21/2022   CO2 26 12/21/2022   TSH 1.156 04/21/2020   INR 1.1 06/15/2020   HGBA1C 7.5 (A) 05/24/2023     Assessment & Plan:  Diagnoses and all orders for this visit:  Essential hypertension -     CMP14+EGFR  Type 2 diabetes mellitus with hyperglycemia, with long-term current use of insulin  (HCC) -     POCT glycosylated hemoglobin (Hb A1C) -     CBC with Differential/Platelet -     Lipid panel  Middle cerebral artery embolism, bilateral -     Lipid panel    Follow-up:  Return in about 3 months (around 08/24/2023).  The above assessment and management plan was discussed with the patient. The patient verbalized understanding of and has agreed to the management plan. Patient is aware to call the clinic if symptoms fail to improve or worsen. Patient is aware when to return to the clinic for a follow-up visit. Patient educated on when it is appropriate to go to the emergency department.   Madelyn Schick, NP-C

## 2023-05-25 LAB — CBC WITH DIFFERENTIAL/PLATELET
Basophils Absolute: 0.1 10*3/uL (ref 0.0–0.2)
Basos: 1 %
EOS (ABSOLUTE): 0.2 10*3/uL (ref 0.0–0.4)
Eos: 3 %
Hematocrit: 47.1 % (ref 37.5–51.0)
Hemoglobin: 14.9 g/dL (ref 13.0–17.7)
Immature Grans (Abs): 0 10*3/uL (ref 0.0–0.1)
Immature Granulocytes: 0 %
Lymphocytes Absolute: 2.5 10*3/uL (ref 0.7–3.1)
Lymphs: 44 %
MCH: 24.7 pg — ABNORMAL LOW (ref 26.6–33.0)
MCHC: 31.6 g/dL (ref 31.5–35.7)
MCV: 78 fL — ABNORMAL LOW (ref 79–97)
Monocytes Absolute: 0.5 10*3/uL (ref 0.1–0.9)
Monocytes: 9 %
Neutrophils Absolute: 2.5 10*3/uL (ref 1.4–7.0)
Neutrophils: 43 %
Platelets: 274 10*3/uL (ref 150–450)
RBC: 6.04 x10E6/uL — ABNORMAL HIGH (ref 4.14–5.80)
RDW: 12.9 % (ref 11.6–15.4)
WBC: 5.7 10*3/uL (ref 3.4–10.8)

## 2023-05-25 LAB — CMP14+EGFR
ALT: 19 IU/L (ref 0–44)
AST: 20 IU/L (ref 0–40)
Albumin: 4.4 g/dL (ref 4.1–5.1)
Alkaline Phosphatase: 113 IU/L (ref 44–121)
BUN/Creatinine Ratio: 15 (ref 9–20)
BUN: 18 mg/dL (ref 6–24)
Bilirubin Total: 0.3 mg/dL (ref 0.0–1.2)
CO2: 24 mmol/L (ref 20–29)
Calcium: 9.1 mg/dL (ref 8.7–10.2)
Chloride: 97 mmol/L (ref 96–106)
Creatinine, Ser: 1.22 mg/dL (ref 0.76–1.27)
Globulin, Total: 3.1 g/dL (ref 1.5–4.5)
Sodium: 141 mmol/L (ref 134–144)
Total Protein: 7.5 g/dL (ref 6.0–8.5)
eGFR: 73 mL/min/{1.73_m2} (ref >59)

## 2023-05-25 LAB — LIPID PANEL
Chol/HDL Ratio: 2.9 ratio (ref 0.0–5.0)
Cholesterol, Total: 101 mg/dL (ref 100–199)
HDL: 35 mg/dL — ABNORMAL LOW (ref >39)
LDL Chol Calc (NIH): 47 mg/dL (ref 0–99)
Triglycerides: 102 mg/dL (ref 0–149)
VLDL Cholesterol Cal: 19 mg/dL (ref 5–40)

## 2023-05-25 LAB — MICROALBUMIN / CREATININE URINE RATIO
Creatinine, Urine: 80 mg/dL
Microalb/Creat Ratio: <4 mg/g{creat} (ref 0–29)
Microalbumin, Urine: <3 ug/mL

## 2023-05-26 ENCOUNTER — Encounter (INDEPENDENT_AMBULATORY_CARE_PROVIDER_SITE_OTHER): Payer: Self-pay | Admitting: Primary Care

## 2023-05-31 ENCOUNTER — Other Ambulatory Visit: Payer: Self-pay

## 2023-06-19 ENCOUNTER — Other Ambulatory Visit: Payer: Self-pay

## 2023-07-05 ENCOUNTER — Other Ambulatory Visit (INDEPENDENT_AMBULATORY_CARE_PROVIDER_SITE_OTHER): Payer: Self-pay | Admitting: Primary Care

## 2023-07-05 ENCOUNTER — Other Ambulatory Visit: Payer: Self-pay

## 2023-07-05 DIAGNOSIS — F32A Depression, unspecified: Secondary | ICD-10-CM

## 2023-07-05 DIAGNOSIS — I10 Essential (primary) hypertension: Secondary | ICD-10-CM

## 2023-07-06 ENCOUNTER — Other Ambulatory Visit: Payer: Self-pay

## 2023-07-06 MED ORDER — TRAZODONE HCL 50 MG PO TABS
25.0000 mg | ORAL_TABLET | Freq: Every evening | ORAL | 0 refills | Status: DC | PRN
  Filled 2023-07-06 – 2023-07-20 (×2): qty 90, 90d supply, fill #0

## 2023-07-06 MED ORDER — VALSARTAN-HYDROCHLOROTHIAZIDE 320-25 MG PO TABS
1.0000 | ORAL_TABLET | Freq: Every day | ORAL | 0 refills | Status: DC
  Filled 2023-07-06 – 2023-07-22 (×2): qty 90, 90d supply, fill #0

## 2023-07-06 MED ORDER — CARVEDILOL 6.25 MG PO TABS
6.2500 mg | ORAL_TABLET | Freq: Two times a day (BID) | ORAL | 0 refills | Status: DC
Start: 2023-07-06 — End: 2023-10-02
  Filled 2023-07-06: qty 180, 90d supply, fill #0

## 2023-07-06 MED ORDER — AMLODIPINE BESYLATE 10 MG PO TABS
10.0000 mg | ORAL_TABLET | Freq: Every day | ORAL | 0 refills | Status: DC
  Filled 2023-07-06 – 2023-08-17 (×3): qty 90, 90d supply, fill #0

## 2023-07-23 ENCOUNTER — Other Ambulatory Visit: Payer: Self-pay

## 2023-08-17 ENCOUNTER — Other Ambulatory Visit: Payer: Self-pay

## 2023-08-18 ENCOUNTER — Ambulatory Visit (HOSPITAL_COMMUNITY)
Admission: EM | Admit: 2023-08-18 | Discharge: 2023-08-18 | Disposition: A | Discharge status: Home / Self Care | Attending: Family Medicine | Admitting: Family Medicine

## 2023-08-18 ENCOUNTER — Encounter (HOSPITAL_COMMUNITY): Payer: Self-pay

## 2023-08-18 DIAGNOSIS — H109 Unspecified conjunctivitis: Secondary | ICD-10-CM

## 2023-08-18 MED ORDER — GENTAMICIN SULFATE 0.3 % OP SOLN: 2.0000 [drp] | OPHTHALMIC | 0 refills | Status: AC

## 2023-08-18 NOTE — Discharge Instructions (Signed)
You have bacterial conjunctivitis (pink eye) which is an eye infection.    - Use antibiotic eye medication sent to pharmacy as directed.  - Change your pillowcase after 2 to 3 days to avoid reinfection.  - You may take Tylenol every 6 hours as needed for any pain you may have.  - Avoid scratching your eye.  Wash your hands frequently to avoid spread of infection to others.  Perform warm compresses to your eye before applying the eye medication.  If you develop any new or worsening symptoms or do not improve in the next 2 to 3 days, please return.  If your symptoms are severe, please go to the emergency room.  Follow-up with your primary care provider for further evaluation and management of your symptoms as well as ongoing wellness visits.  I hope you feel better!  

## 2023-08-18 NOTE — ED Provider Notes (Signed)
 [facility]    CSN: [id number] Arrival date & time: 08/18/23  1202      History   Chief Complaint Chief Complaint  Patient presents with   Eye Pain    HPI Nathaniel Spears is a 48 y.o. male.   Nathaniel Spears is a 48 y.o. male presenting for chief complaint of Eye Pain, redness, swelling, and crusty drainage that started yesterday and worsened this morning upon waking this morning.  He wears glasses for vision correction, denies contact lens use.  Denies visual disturbance.  No cough, congestion, rhinorrhea, fever, chills, or recent sick contacts with similar symptoms.  Denies photophobia and watery drainage.  No recent traumas or injuries to the eye.  Right eye is asymptomatic currently.  He has been using lubricating eyedrops with minimal relief.   Eye Pain    Past Medical History:  Diagnosis Date   Diabetes mellitus without complication (HCC)    Hypertension    Stroke [facility])     Patient Active Problem List   Diagnosis Date Noted   AKI (acute kidney injury) (HCC) 07/07/2020   Middle cerebral artery embolism, 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   Acute ischemic stroke (HCC) 06/05/2020   Acute left-sided weakness 06/04/2020   Nicotine  dependence 06/04/2020   Cocaine use 06/04/2020   Aphasia    Polysubstance abuse (HCC)    Type 2 diabetes mellitus with hyperglycemia, with long-term current use of insulin  (HCC)    Hypokalemia    Acute CVA (cerebrovascular accident) (HCC) 04/20/2020   Essential hypertension 08/21/2019   Obesity 08/21/2019    Past Surgical History:  Procedure Laterality Date   BUBBLE STUDY  06/01/2020   Procedure: BUBBLE STUDY;  Surgeon: Kate Lonni CROME, MD;  Location: [facility] END[facility]COPY;  Service: Cardiovascular;;   IR ANGIO INTRA EXTRACRAN SEL COM CAROTID INNOMINATE BILAT MOD SED  06/02/2020   IR ANGIO VERTEBRAL SEL SUBCLAVIAN INNOMINATE BILAT MOD SED  06/02/2020   TEE WITHOUT CARDIOVERSION N/A 06/01/2020   Procedure: TRANSESOPHAGEAL ECHOCARDIOGRAM (TEE);   Surgeon: Kate Lonni CROME, MD;  Location: [facility] ENDOSCOPY;  Service: Cardiovascular;  Laterality: N/A;       Home Medications    Prior to Admission medications   Medication Sig Start Date End Date Taking? Authorizing Provider  amLODipine  (NORVASC ) 10 MG tablet Take 1 tablet (10 mg total) by mouth daily. 07/06/23  Yes Celestia Rosaline SQUIBB, NP  carvedilol  (COREG ) 6.25 MG tablet Take 1 tablet (6.25 mg total) by mouth 2 (two) times daily with a meal. 07/06/23  Yes Celestia Rosaline SQUIBB, NP  empagliflozin  (JARDIANCE ) 25 MG TABS tablet Take 1 tablet (25 mg total) by mouth daily before breakfast. 01/08/23  Yes Celestia Rosaline SQUIBB, NP  gentamicin  (GARAMYCIN ) 0.3 % ophthalmic solution Place 2 drops into both eyes every 4 (four) hours. 08/18/23  Yes Enedelia Dorna HERO, FNP  insulin  glargine (LANTUS  SOLOSTAR) 100 UNIT/ML Solostar Pen Inject 36 Units into the skin daily. 05/11/23  Yes Celestia Rosaline SQUIBB, NP  traZODone  (DESYREL ) 50 MG tablet Take 0.5-1 tablets (25-50 mg total) by mouth at bedtime as needed for sleep. 07/06/23  Yes Celestia Rosaline SQUIBB, NP  valsartan -hydrochlorothiazide  (DIOVAN -HCT) 320-25 MG tablet Take 1 tablet by mouth daily. 07/06/23  Yes Celestia Rosaline SQUIBB, NP  Accu-Chek Softclix Lancets lancets Use to check blood sugar three times daily. 08/10/21   Newlin, Enobong, MD  Blood Glucose Monitoring Suppl (ACCU-CHEK GUIDE) w/Device KIT Use to check blood sugar three times daily. 08/10/21   Newlin, Enobong, MD  Blood Pressure Monitoring (BLOOD PRESSURE  CUFF) MISC Use to check blood pressure once daily. 10/13/21   Newlin, Enobong, MD  glucose blood (ACCU-CHEK GUIDE TEST) test strip Use to check blood sugar 3 times daily. 01/12/23   Delbert Clam, MD  Insulin  Pen Needle (PENTIPS) 32G X 4 MM MISC Use as directed 04/25/22   Delbert Clam, MD    Family History Family History  Problem Relation Age of Onset   Stroke Neg Hx    Colon cancer Neg Hx    Esophageal cancer Neg Hx    Rectal cancer Neg  Hx    Stomach cancer Neg Hx     Social History Social History   Tobacco Use   Smoking status: Former    Types: Cigarettes   Smokeless tobacco: Never  Vaping Use   Vaping status: Never Used  Substance Use Topics   Alcohol use: Not Currently   Drug use: Never     Allergies   Milk-related compounds   Review of Systems Review of Systems  Eyes:  Positive for pain.  Per HPI  Physical Exam Triage Vital Signs ED Triage Vitals  Encounter Vitals Group     BP 08/18/23 1231 114/76     Girls Systolic BP Percentile --      Girls Diastolic BP Percentile --      Boys Systolic BP Percentile --      Boys Diastolic BP Percentile --      Pulse Rate 08/18/23 1231 62     Resp 08/18/23 1231 18     Temp 08/18/23 1231 98.2 F (36.8 C)     Temp Source 08/18/23 1231 Oral     SpO2 08/18/23 1231 96 %     Weight 08/18/23 1231 240 lb (108.9 kg)     Height 08/18/23 1231 6' 1 (1.854 m)     Head Circumference --      Peak Flow --      Pain Score 08/18/23 1230 3     Pain Loc --      Pain Education --      Exclude from Growth Chart --    No data found.  Updated Vital Signs BP 114/76 (BP Location: Left Arm)   Pulse 62   Temp 98.2 F (36.8 C) (Oral)   Resp 18   Ht 6' 1 (1.854 m)   Wt 240 lb (108.9 kg)   SpO2 96%   BMI 31.66 kg/m   Visual Acuity Right Eye Distance:   Left Eye Distance:   Bilateral Distance:    Right Eye Near:   Left Eye Near:    Bilateral Near:     Physical Exam Vitals and nursing note reviewed.  Constitutional:      Appearance: He is not ill-appearing or toxic-appearing.  HENT:     Head: Normocephalic and atraumatic.     Right Ear: Hearing and external ear normal.     Left Ear: Hearing and external ear normal.     Nose: Nose normal.     Mouth/Throat:     Lips: Pink.  Eyes:     General: Lids are normal. Vision grossly intact. Gaze aligned appropriately.        Right eye: No foreign body, discharge or hordeolum.        Left eye: Discharge (Crusty  white/clear discharge from the left eye) present.No foreign body or hordeolum.     Extraocular Movements: Extraocular movements intact.     Conjunctiva/sclera:     Right eye: Right conjunctiva is not injected.  No chemosis, exudate or hemorrhage.    Left eye: Left conjunctiva is injected. No chemosis, exudate or hemorrhage.    Pupils: Pupils are equal, round, and reactive to light.  Pulmonary:     Effort: Pulmonary effort is normal.  Musculoskeletal:     Cervical back: Neck supple.  Lymphadenopathy:     Cervical: No cervical adenopathy.  Skin:    General: Skin is warm and dry.     Capillary Refill: Capillary refill takes less than 2 seconds.     Findings: No rash.  Neurological:     General: No focal deficit present.     Mental Status: He is alert and oriented to person, place, and time. Mental status is at baseline.     Cranial Nerves: No dysarthria or facial asymmetry.  Psychiatric:        Mood and Affect: Mood normal.        Speech: Speech normal.        Behavior: Behavior normal.        Thought Content: Thought content normal.        Judgment: Judgment normal.      UC Treatments / Results  Labs (all labs ordered are listed, but only abnormal results are displayed) Labs Reviewed - No data to display  EKG   Radiology No results found.  Procedures Procedures (including critical care time)  Medications Ordered in UC Medications - No data to display  Initial Impression / Assessment and Plan / UC Course  I have reviewed the triage vital signs and the nursing notes.  Pertinent labs & imaging results that were available during my care of the patient were reviewed by me and considered in my medical decision making (see chart for details).   1.  Bacterial conjunctivitis of left eye Presentation consistent with acute bacterial conjunctivitis.  HEENT exam stable, low suspicion for ocular emergency.  Ophthalmic medication as prescribed for the next 7 days.  Warm compress  recommended frequently.  Over the counter medications as needed for aches/pains. Hand hygiene discussed to prevent spread of infection to others.  Advised to change pillowcase after 2 to 3 days of antibiotics to avoid reinfection.   Counseled patient on potential for adverse effects with medications prescribed/recommended today, strict ER and return-to-clinic precautions discussed, patient verbalized understanding.    Final Clinical Impressions(s) / UC Diagnoses   Final diagnoses:  Bacterial conjunctivitis of left eye     Discharge Instructions      You have bacterial conjunctivitis (pink eye) which is an eye infection.    - Use antibiotic eye medication sent to pharmacy as directed.  - Change your pillowcase after 2 to 3 days to avoid reinfection.  - You may take Tylenol  every 6 hours as needed for any pain you may have.  - Avoid scratching your eye.  Wash your hands frequently to avoid spread of infection to others.  Perform warm compresses to your eye before applying the eye medication.  If you develop any new or worsening symptoms or do not improve in the next 2 to 3 days, please return.  If your symptoms are severe, please go to the emergency room.  Follow-up with your primary care provider for further evaluation and management of your symptoms as well as ongoing wellness visits.  I hope you feel better!    ED Prescriptions     Medication Sig Dispense Auth. Provider   gentamicin  (GARAMYCIN ) 0.3 % ophthalmic solution Place 2 drops into both eyes every 4 (  four) hours. 5 mL Enedelia Dorna HERO, FNP      PDMP not reviewed this encounter.   Enedelia Dorna HERO, [place] 08/18/23 1340

## 2023-08-18 NOTE — ED Triage Notes (Signed)
 Patient presenting with left eye pain, redness, and itching onset 2 days ago. No one else with similar symptoms. No new products used recently.  Prescriptions or OTC medications tried: Yes- otc eye drops    with no relief.

## 2023-08-27 ENCOUNTER — Ambulatory Visit (INDEPENDENT_AMBULATORY_CARE_PROVIDER_SITE_OTHER): Admitting: Primary Care

## 2023-08-27 ENCOUNTER — Other Ambulatory Visit: Payer: Self-pay

## 2023-08-27 VITALS — BP 109/74 | HR 65 | Wt 252.4 lb

## 2023-08-27 DIAGNOSIS — Z794 Long term (current) use of insulin: Secondary | ICD-10-CM

## 2023-08-27 DIAGNOSIS — K219 Gastro-esophageal reflux disease without esophagitis: Secondary | ICD-10-CM

## 2023-08-27 DIAGNOSIS — I10 Essential (primary) hypertension: Secondary | ICD-10-CM

## 2023-08-27 DIAGNOSIS — F32A Depression, unspecified: Secondary | ICD-10-CM | POA: Diagnosis not present

## 2023-08-27 DIAGNOSIS — E1165 Type 2 diabetes mellitus with hyperglycemia: Secondary | ICD-10-CM

## 2023-08-27 DIAGNOSIS — R0981 Nasal congestion: Secondary | ICD-10-CM

## 2023-08-27 MED ORDER — FLUTICASONE PROPIONATE 50 MCG/ACT NA SUSP
2.0000 | Freq: Every day | NASAL | 6 refills | Status: AC
Start: 2023-08-27 — End: ?
  Filled 2023-08-27: qty 16, 30d supply, fill #0
  Filled 2023-10-02: qty 16, 30d supply, fill #1
  Filled 2023-11-13: qty 16, 30d supply, fill #2
  Filled 2024-01-04: qty 16, 30d supply, fill #3

## 2023-08-27 MED ORDER — LORATADINE 10 MG PO TABS
10.0000 mg | ORAL_TABLET | Freq: Every day | ORAL | 1 refills | Status: AC
Start: 2023-08-27 — End: ?
  Filled 2023-08-27: qty 30, 30d supply, fill #0
  Filled 2023-10-02: qty 30, 30d supply, fill #1
  Filled 2023-10-17 – 2023-10-28 (×3): qty 30, 30d supply, fill #2
  Filled 2023-11-13 – 2023-11-20 (×3): qty 30, 30d supply, fill #3
  Filled 2024-01-04: qty 30, 30d supply, fill #4

## 2023-08-27 NOTE — Progress Notes (Signed)
 [facility]  Nathaniel Spears, is a 48 y.o. male  RDW:[id number]  FMW:[id number]  DOB - 06-23-1975   Congestion, rhinitis,  indigestion       Subjective:   Nathaniel Spears is a 48 y.o. male here today for an acute visit.  Patient is in today for complaints of nasal congestion, rhinitis, watery with itching and indigestion.    No problems updated.  Comprehensive ROS Pertinent positive and negative noted in HPI   Allergies  Allergen Reactions   Milk-Related Compounds     Past Medical History:  Diagnosis Date   Diabetes mellitus without complication (HCC)    Hypertension    Stroke [facility])     Current Outpatient Medications on File Prior to Visit  Medication Sig Dispense Refill   Accu-Chek Softclix Lancets lancets Use to check blood sugar three times daily. 100 each 2   amLODipine  (NORVASC ) 10 MG tablet Take 1 tablet (10 mg total) by mouth daily. 90 tablet 0   Blood Glucose Monitoring Suppl (ACCU-CHEK GUIDE) w/Device KIT Use to check blood sugar three times daily. 1 kit 0   Blood Pressure Monitoring (BLOOD PRESSURE CUFF) MISC Use to check blood pressure once daily. 1 each 0   carvedilol  (COREG ) 6.25 MG tablet Take 1 tablet (6.25 mg total) by mouth 2 (two) times daily with a meal. 180 tablet 0   empagliflozin  (JARDIANCE ) 25 MG TABS tablet Take 1 tablet (25 mg total) by mouth daily before breakfast. 90 tablet 1   gentamicin  (GARAMYCIN ) 0.3 % ophthalmic solution Place 2 drops into both eyes every 4 (four) hours. 5 mL 0   glucose blood (ACCU-CHEK GUIDE TEST) test strip Use to check blood sugar 3 times daily. 100 each 6   insulin  glargine (LANTUS  SOLOSTAR) 100 UNIT/ML Solostar Pen Inject 36 Units into the skin daily. 15 mL 1   Insulin  Pen Needle (PENTIPS) 32G X 4 MM MISC Use as directed 100 each 1   traZODone  (DESYREL ) 50 MG tablet Take 0.5-1 tablets (25-50 mg total) by mouth at bedtime as needed for sleep. 90 tablet 0   valsartan -hydrochlorothiazide  (DIOVAN -HCT) 320-25  MG tablet Take 1 tablet by mouth daily. 90 tablet 0   Current Facility-Administered Medications on File Prior to Visit  Medication Dose Route Frequency Provider Last Rate Last Admin   0.9 %  sodium chloride  infusion  500 mL Intravenous Once Federico Rosario BROCKS, MD       Health Maintenance  Topic Date Due   Eye exam for diabetics  Never done   Hepatitis B Vaccine (1 of 3 - 19+ 3-dose series) Never done   COVID-19 Vaccine (3 - 2024-25 season) 09/17/2022   Complete foot exam   06/20/2023   Flu Shot  08/17/2023   Hemoglobin A1C  11/24/2023   Yearly kidney function blood test for diabetes  05/23/2024   Yearly kidney health urinalysis for diabetes  05/23/2024   Colon Cancer Screening  12/19/2029   DTaP/Tdap/Td vaccine (2 - Td or Tdap) 12/20/2032   Pneumococcal Vaccine for high risk medical condition  Completed   Hepatitis C Screening  Completed   HIV Screening  Completed   HPV Vaccine  Aged Out   Meningitis B Vaccine  Aged Out    Objective:   Vitals:   08/27/23 1152  BP: 109/74  Pulse: 65  SpO2: 98%  Weight: 252 lb 6.4 oz (114.5 kg)    Physical Exam Vitals reviewed.  Constitutional:      Appearance: He is obese.  HENT:  Head: Normocephalic.     Right Ear: Tympanic membrane, ear canal and external ear normal.     Left Ear: Tympanic membrane, ear canal and external ear normal.     Nose: Congestion present.     Comments: Red boggy swollen turbinates right side greater than left Cardiovascular:     Rate and Rhythm: Normal rate and regular rhythm.  Pulmonary:     Effort: Pulmonary effort is normal.     Breath sounds: Normal breath sounds.  Abdominal:     General: Bowel sounds are normal. There is distension.     Palpations: Abdomen is soft.  Musculoskeletal:     Cervical back: Normal range of motion and neck supple.  Skin:    General: Skin is warm and dry.  Neurological:     Mental Status: He is alert and oriented to person, place, and time.  Psychiatric:        Mood and  Affect: Mood normal.        Behavior: Behavior normal.     Assessment & Plan  Diagnoses and all orders for this visit:  Nasal congestion -     fluticasone  (FLONASE ) 50 MCG/ACT nasal spray; Place 2 sprays into both nostrils daily. -     loratadine  (CLARITIN ) 10 MG tablet; Take 1 tablet (10 mg total) by mouth daily.  Essential hypertension Well controlled  -     valsartan -hydrochlorothiazide  (DIOVAN -HCT) 320-25 MG tablet; Take 1 tablet by mouth daily.  Depression, unspecified depression type -     traZODone  (DESYREL ) 50 MG tablet; Take 0.5-1 tablets (25-50 mg total) by mouth at bedtime as needed for sleep.  Type 2 diabetes mellitus with hyperglycemia, with long-term current use of insulin  (HCC) -     insulin  glargine (LANTUS  SOLOSTAR) 100 UNIT/ML Solostar Pen; Inject 36 Units into the skin daily.  Other orders -     amLODipine  (NORVASC ) 10 MG tablet; Take 1 tablet (10 mg total) by mouth daily.  Gastroesophageal reflux disease without esophagitis Discussed eating small frequent meal, reduction in acidic foods, fried foods ,spicy foods, alcohol caffeine and tobacco and certain medications. Avoid laying down after eating 2mins-1hour, elevated head of the bed.      Patient have been counseled extensively about nutrition and exercise. Other issues discussed during this visit include: low cholesterol diet, weight control and daily exercise, foot care, annual eye examinations at Ophthalmology, importance of adherence with medications and regular follow-up. We also discussed long term complications of uncontrolled diabetes and hypertension.   Keep schedule appt  The patient was given clear instructions to go to ER or return to medical center if symptoms don't improve, worsen or new problems develop. The patient verbalized understanding. The patient was told to call to get lab results if they haven't heard anything in the next week.   This note has been created with Engineer, agricultural. Any transcriptional errors are unintentional.   Rosaline SHAUNNA Bohr, NP 08/27/2023, 12:40 PM

## 2023-08-29 ENCOUNTER — Encounter (INDEPENDENT_AMBULATORY_CARE_PROVIDER_SITE_OTHER): Payer: Self-pay | Admitting: Primary Care

## 2023-08-29 ENCOUNTER — Ambulatory Visit (INDEPENDENT_AMBULATORY_CARE_PROVIDER_SITE_OTHER): Admitting: Primary Care

## 2023-08-29 VITALS — BP 133/85 | HR 79 | Resp 19 | Wt 256.6 lb

## 2023-08-29 DIAGNOSIS — Z794 Long term (current) use of insulin: Secondary | ICD-10-CM

## 2023-08-29 DIAGNOSIS — E1165 Type 2 diabetes mellitus with hyperglycemia: Secondary | ICD-10-CM | POA: Diagnosis not present

## 2023-08-29 DIAGNOSIS — I10 Essential (primary) hypertension: Secondary | ICD-10-CM

## 2023-08-29 DIAGNOSIS — K219 Gastro-esophageal reflux disease without esophagitis: Secondary | ICD-10-CM

## 2023-08-29 LAB — POCT GLYCOSYLATED HEMOGLOBIN (HGB A1C): Hemoglobin A1C: 7.6 % — AB (ref 4.0–5.6)

## 2023-08-30 ENCOUNTER — Other Ambulatory Visit: Payer: Self-pay

## 2023-08-30 MED ORDER — OMEPRAZOLE 20 MG PO CPDR
20.0000 mg | DELAYED_RELEASE_CAPSULE | Freq: Every day | ORAL | 3 refills | Status: AC
  Filled 2023-08-30: qty 30, 30d supply, fill #0
  Filled 2023-10-02: qty 30, 30d supply, fill #1
  Filled 2024-01-22 – 2024-01-23 (×2): qty 30, 30d supply, fill #2

## 2023-08-31 ENCOUNTER — Other Ambulatory Visit (INDEPENDENT_AMBULATORY_CARE_PROVIDER_SITE_OTHER): Payer: Self-pay | Admitting: Primary Care

## 2023-08-31 ENCOUNTER — Other Ambulatory Visit: Payer: Self-pay

## 2023-08-31 DIAGNOSIS — F32A Depression, unspecified: Secondary | ICD-10-CM

## 2023-09-01 ENCOUNTER — Encounter (INDEPENDENT_AMBULATORY_CARE_PROVIDER_SITE_OTHER): Payer: Self-pay | Admitting: Primary Care

## 2023-09-01 MED ORDER — VALSARTAN-HYDROCHLOROTHIAZIDE 320-25 MG PO TABS
1.0000 | ORAL_TABLET | Freq: Every day | ORAL | 1 refills | Status: AC
  Filled 2023-09-01 – 2023-10-23 (×2): qty 90, 90d supply, fill #0
  Filled 2024-01-22 – 2024-01-23 (×2): qty 90, 90d supply, fill #1

## 2023-09-01 MED ORDER — AMLODIPINE BESYLATE 10 MG PO TABS
10.0000 mg | ORAL_TABLET | Freq: Every day | ORAL | 1 refills | Status: AC
  Filled 2023-09-01 – 2023-11-13 (×2): qty 90, 90d supply, fill #0
  Filled 2024-01-14 – 2024-01-15 (×2): qty 90, 90d supply, fill #1

## 2023-09-01 MED ORDER — TRAZODONE HCL 50 MG PO TABS
25.0000 mg | ORAL_TABLET | Freq: Every evening | ORAL | 1 refills | Status: DC | PRN
  Filled 2023-09-01: qty 90, 90d supply, fill #0
  Filled 2023-09-04: qty 30, 30d supply, fill #0
  Filled 2023-10-02 – 2023-10-03 (×3): qty 30, 30d supply, fill #1
  Filled 2023-10-17 – 2023-10-23 (×3): qty 30, 30d supply, fill #2
  Filled 2023-11-22: qty 30, 30d supply, fill #3
  Filled 2023-12-20: qty 30, 30d supply, fill #4
  Filled 2024-01-18: qty 30, 30d supply, fill #5

## 2023-09-01 MED ORDER — LANTUS SOLOSTAR 100 UNIT/ML ~~LOC~~ SOPN
36.0000 [IU] | PEN_INJECTOR | Freq: Every day | SUBCUTANEOUS | 1 refills | Status: DC
  Filled 2023-09-01: qty 15, 41d supply, fill #0
  Filled 2023-10-23: qty 15, 41d supply, fill #1

## 2023-09-02 NOTE — Progress Notes (Signed)
 [facility]  Nathaniel Spears, is a 48 y.o. male  RDW:[id number]  FMW:[id number]  DOB - 12/07/1975  Chief Complaint  Patient presents with   Hypertension       Subjective:   Nathaniel Spears is a 48 y.o. male here today for management of  T2D.Denies polyuria, polydipsia, polyphasia or vision changes.  Does not check blood sugars at home. HTN-Patient has No headache, No chest pain, No abdominal pain - No Nausea, No new weakness tingling or numbness, No Cough - shortness of breath . He c/o GERD.-  Comprehensive ROS Pertinent positive and negative noted in HPI   Allergies  Allergen Reactions   Milk-Related Compounds     Past Medical History:  Diagnosis Date   Diabetes mellitus without complication (HCC)    Hypertension    Stroke [facility]     Current Outpatient Medications on File Prior to Visit  Medication Sig Dispense Refill   Accu-Chek Softclix Lancets lancets Use to check blood sugar three times daily. 100 each 2   Blood Glucose Monitoring Suppl (ACCU-CHEK GUIDE) w/Device KIT Use to check blood sugar three times daily. 1 kit 0   Blood Pressure Monitoring (BLOOD PRESSURE CUFF) MISC Use to check blood pressure once daily. 1 each 0   carvedilol  (COREG ) 6.25 MG tablet Take 1 tablet (6.25 mg total) by mouth 2 (two) times daily with a meal. 180 tablet 0   empagliflozin  (JARDIANCE ) 25 MG TABS tablet Take 1 tablet (25 mg total) by mouth daily before breakfast. 90 tablet 1   fluticasone  (FLONASE ) 50 MCG/ACT nasal spray Place 2 sprays into both nostrils daily. 16 g 6   glucose blood (ACCU-CHEK GUIDE TEST) test strip Use to check blood sugar 3 times daily. 100 each 6   Insulin  Pen Needle (PENTIPS) 32G X 4 MM MISC Use as directed 100 each 1   loratadine  (CLARITIN ) 10 MG tablet Take 1 tablet (10 mg total) by mouth daily. 90 tablet 1   amLODipine  (NORVASC ) 10 MG tablet Take 1 tablet (10 mg total) by mouth daily. 90 tablet 1   gentamicin  (GARAMYCIN ) 0.3 % ophthalmic solution Place  2 drops into both eyes every 4 (four) hours. (Patient not taking: Reported on 08/29/2023) 5 mL 0   insulin  glargine (LANTUS  SOLOSTAR) 100 UNIT/ML Solostar Pen Inject 36 Units into the skin daily. 15 mL 1   traZODone  (DESYREL ) 50 MG tablet Take 0.5-1 tablets (25-50 mg total) by mouth at bedtime as needed for sleep. 90 tablet 1   valsartan -hydrochlorothiazide  (DIOVAN -HCT) 320-25 MG tablet Take 1 tablet by mouth daily. 90 tablet 1   No current facility-administered medications on file prior to visit.   Health Maintenance  Topic Date Due   Eye exam for diabetics  Never done   COVID-19 Vaccine (3 - 2024-25 season) 09/17/2022   Complete foot exam   06/20/2023   Flu Shot  08/17/2023   Hepatitis B Vaccine (1 of 3 - 19+ 3-dose series) 08/28/2024*   Hemoglobin A1C  02/29/2024   Yearly kidney function blood test for diabetes  05/23/2024   Yearly kidney health urinalysis for diabetes  05/23/2024   Colon Cancer Screening  12/19/2029   DTaP/Tdap/Td vaccine (2 - Td or Tdap) 12/20/2032   Pneumococcal Vaccine  Completed   Hepatitis C Screening  Completed   HIV Screening  Completed   HPV Vaccine  Aged Out   Meningitis B Vaccine  Aged Out  *Topic was postponed. The date shown is not the original due date.  Objective:   Vitals:   08/29/23 1620  BP: 133/85  Pulse: 79  Resp: 19  SpO2: 100%  Weight: 256 lb 9.6 oz (116.4 kg)   BP Readings from Last 3 Encounters:  08/29/23 133/85  08/27/23 109/74  08/18/23 114/76      Physical Exam Vitals reviewed.  Constitutional:      Appearance: He is obese.  HENT:     Head: Normocephalic.     Right Ear: Tympanic membrane, ear canal and external ear normal.     Left Ear: Tympanic membrane, ear canal and external ear normal.     Nose: Nose normal.  Eyes:     Extraocular Movements: Extraocular movements intact.  Cardiovascular:     Rate and Rhythm: Normal rate and regular rhythm.  Pulmonary:     Effort: Pulmonary effort is normal.     Breath  sounds: Normal breath sounds.  Abdominal:     General: Bowel sounds are normal. There is distension.     Palpations: Abdomen is soft.  Musculoskeletal:        General: Normal range of motion.     Cervical back: Normal range of motion and neck supple.  Skin:    General: Skin is warm and dry.  Neurological:     Mental Status: He is oriented to person, place, and time.  Psychiatric:        Mood and Affect: Mood normal.        Behavior: Behavior normal.     Assessment & Plan    Brenn was seen today for hypertension.  Diagnoses and all orders for this visit:  Type 2 diabetes mellitus with hyperglycemia, with long-term current use of insulin  (HCC) -     POCT glycosylated hemoglobin (Hb A1C) -     Ambulatory referral to Ophthalmology  Gastroesophageal reflux disease without esophagitis -     omeprazole  (PRILOSEC) 20 MG capsule; Take 1 capsule (20 mg total) by mouth daily.  Essential hypertension BP goal - < 130/80 Explained that having normal blood pressure is the goal and medications are helping to get to goal and maintain normal blood pressure. DIET: Limit salt intake, read nutrition labels to check salt content, limit fried and high fatty foods  Avoid using multisymptom OTC cold preparations that generally contain sudafed which can rise BP. Consult with pharmacist on best cold relief products to use for persons with HTN EXERCISE Discussed incorporating exercise such as walking - 30 minutes most days of the week and can do in 10 minute intervals      Patient have been counseled extensively about nutrition and exercise. Other issues discussed during this visit include: low cholesterol diet, weight control and daily exercise, foot care, annual eye examinations at Ophthalmology, importance of adherence with medications and regular follow-up. We also discussed long term complications of uncontrolled diabetes and hypertension.   Return in about 3 months (around 11/29/2023).  The  patient was given clear instructions to go to ER or return to medical center if symptoms don't improve, worsen or new problems develop. The patient verbalized understanding. The patient was told to call to get lab results if they haven't heard anything in the next week.   This note has been created with Education officer, environmental. Any transcriptional errors are unintentional.   Rosaline SHAUNNA Bohr, NP 09/02/2023, 12:53 AM

## 2023-09-03 ENCOUNTER — Other Ambulatory Visit: Payer: Self-pay

## 2023-09-04 ENCOUNTER — Other Ambulatory Visit: Payer: Self-pay

## 2023-09-07 ENCOUNTER — Other Ambulatory Visit: Payer: Self-pay

## 2023-09-07 ENCOUNTER — Other Ambulatory Visit (INDEPENDENT_AMBULATORY_CARE_PROVIDER_SITE_OTHER): Payer: Self-pay | Admitting: Primary Care

## 2023-09-07 MED ORDER — EMPAGLIFLOZIN 25 MG PO TABS
25.0000 mg | ORAL_TABLET | Freq: Every day | ORAL | 1 refills | Status: AC
  Filled 2023-09-07: qty 90, 90d supply, fill #0
  Filled 2024-01-04: qty 90, 90d supply, fill #1

## 2023-09-10 ENCOUNTER — Telehealth: Payer: Self-pay

## 2023-09-10 ENCOUNTER — Other Ambulatory Visit: Payer: Self-pay

## 2023-09-10 NOTE — Telephone Encounter (Signed)
 Pharmacy Patient Advocate Encounter  Received notification from [facility] MEDICAID that Prior Authorization for JARDIANCE  has been APPROVED from 09/07/2023 to 09/06/2024   PA #/Case ID/Reference #: [id number]

## 2023-09-12 ENCOUNTER — Other Ambulatory Visit: Payer: Self-pay

## 2023-10-02 ENCOUNTER — Other Ambulatory Visit (INDEPENDENT_AMBULATORY_CARE_PROVIDER_SITE_OTHER): Payer: Self-pay | Admitting: Primary Care

## 2023-10-02 ENCOUNTER — Other Ambulatory Visit: Payer: Self-pay

## 2023-10-02 DIAGNOSIS — I10 Essential (primary) hypertension: Secondary | ICD-10-CM

## 2023-10-02 MED ORDER — CARVEDILOL 6.25 MG PO TABS
6.2500 mg | ORAL_TABLET | Freq: Two times a day (BID) | ORAL | 0 refills | Status: DC
  Filled 2023-10-02: qty 180, 90d supply, fill #0

## 2023-10-03 ENCOUNTER — Other Ambulatory Visit: Payer: Self-pay

## 2023-10-04 ENCOUNTER — Other Ambulatory Visit: Payer: Self-pay

## 2023-10-16 ENCOUNTER — Other Ambulatory Visit: Payer: Self-pay

## 2023-10-17 ENCOUNTER — Other Ambulatory Visit: Payer: Self-pay

## 2023-10-18 ENCOUNTER — Other Ambulatory Visit: Payer: Self-pay

## 2023-10-23 ENCOUNTER — Other Ambulatory Visit: Payer: Self-pay

## 2023-10-29 ENCOUNTER — Other Ambulatory Visit: Payer: Self-pay

## 2023-11-05 ENCOUNTER — Ambulatory Visit (INDEPENDENT_AMBULATORY_CARE_PROVIDER_SITE_OTHER)

## 2023-11-05 ENCOUNTER — Other Ambulatory Visit (INDEPENDENT_AMBULATORY_CARE_PROVIDER_SITE_OTHER)

## 2023-11-05 DIAGNOSIS — Z23 Encounter for immunization: Secondary | ICD-10-CM | POA: Diagnosis not present

## 2023-11-05 NOTE — Patient Instructions (Signed)

## 2023-11-05 NOTE — Progress Notes (Signed)
Patient presents for vaccine injection today. Patient tolerated injection well and was observed without any concerns.  

## 2023-11-13 ENCOUNTER — Other Ambulatory Visit: Payer: Self-pay

## 2023-11-23 ENCOUNTER — Other Ambulatory Visit: Payer: Self-pay

## 2023-11-30 ENCOUNTER — Other Ambulatory Visit: Payer: Self-pay

## 2023-11-30 ENCOUNTER — Other Ambulatory Visit: Payer: Self-pay | Admitting: Family Medicine

## 2023-11-30 MED ORDER — TECHLITE PLUS PEN NEEDLES 32G X 4 MM MISC
0 refills | Status: AC
  Filled 2023-11-30: qty 100, 90d supply, fill #0

## 2023-12-03 ENCOUNTER — Encounter (INDEPENDENT_AMBULATORY_CARE_PROVIDER_SITE_OTHER): Payer: Self-pay | Admitting: Primary Care

## 2023-12-03 ENCOUNTER — Ambulatory Visit (INDEPENDENT_AMBULATORY_CARE_PROVIDER_SITE_OTHER): Admitting: Primary Care

## 2023-12-03 VITALS — BP 142/89 | HR 86 | Resp 16 | Wt 266.0 lb

## 2023-12-03 DIAGNOSIS — E1165 Type 2 diabetes mellitus with hyperglycemia: Secondary | ICD-10-CM | POA: Diagnosis not present

## 2023-12-03 DIAGNOSIS — Z794 Long term (current) use of insulin: Secondary | ICD-10-CM

## 2023-12-03 DIAGNOSIS — I10 Essential (primary) hypertension: Secondary | ICD-10-CM

## 2023-12-06 ENCOUNTER — Encounter (INDEPENDENT_AMBULATORY_CARE_PROVIDER_SITE_OTHER): Payer: Self-pay | Admitting: Primary Care

## 2023-12-06 NOTE — Progress Notes (Signed)
 Subjective:  Patient ID: Nathaniel Spears, male    DOB: 29-Jul-1975  Age: 48 y.o. MRN: [id number]  CC: Diabetes and Hypertension   Eithan Beagle presents for Follow-up of diabetes. Patient does check blood sugar at home Hypertension This is a chronic problem. The current episode started more than 1 year ago. The problem has been waxing and waning since onset. The problem is uncontrolled. There are no associated agents to hypertension. Risk factors for coronary artery disease include obesity, male gender, diabetes mellitus and dyslipidemia. Past treatments include calcium  channel blockers, ACE inhibitors and diuretics. The current treatment provides mild improvement. Compliance problems include exercise, diet and psychosocial issues.  Hypertensive end-organ damage includes CVA.    Compliant with meds - Yes Checking CBGs? Yes  Fasting avg -   Postprandial average -  Exercising regularly? - Yes Watching carbohydrate intake? - Yes Neuropathy ? - Yes Hypoglycemic events - No  - Recovers with :   Pertinent ROS:  Polyuria - No Polydipsia - No Vision problems - No  Medications as noted below. Taking them regularly without complication/adverse reaction being reported today.   History Maurion has a past medical history of Diabetes mellitus without complication (HCC), Hypertension, and Stroke (HCC).   He has a past surgical history that includes TEE without cardioversion (N/A, 06/01/2020); Bubble study (06/01/2020); IR ANGIO INTRA EXTRACRAN SEL COM CAROTID INNOMINATE BILAT MOD SED (06/02/2020); and IR ANGIO VERTEBRAL SEL SUBCLAVIAN INNOMINATE BILAT MOD SED (06/02/2020).   His family history is not on file.He reports that he has quit smoking. His smoking use included cigarettes. He has never used smokeless tobacco. He reports that he does not currently use alcohol. He reports that he does not use drugs.  Current Outpatient Medications on File Prior to Visit  Medication Sig Dispense Refill    Accu-Chek Softclix Lancets lancets Use to check blood sugar three times daily. 100 each 2   amLODipine  (NORVASC ) 10 MG tablet Take 1 tablet (10 mg total) by mouth daily. 90 tablet 1   Blood Glucose Monitoring Suppl (ACCU-CHEK GUIDE) w/Device KIT Use to check blood sugar three times daily. 1 kit 0   Blood Pressure Monitoring (BLOOD PRESSURE CUFF) MISC Use to check blood pressure once daily. 1 each 0   carvedilol  (COREG ) 6.25 MG tablet Take 1 tablet (6.25 mg total) by mouth 2 (two) times daily with a meal. 180 tablet 0   empagliflozin  (JARDIANCE ) 25 MG TABS tablet Take 1 tablet (25 mg total) by mouth daily before breakfast. 90 tablet 1   fluticasone  (FLONASE ) 50 MCG/ACT nasal spray Place 2 sprays into both nostrils daily. 16 g 6   gentamicin  (GARAMYCIN ) 0.3 % ophthalmic solution Place 2 drops into both eyes every 4 (four) hours. (Patient not taking: Reported on 08/29/2023) 5 mL 0   glucose blood (ACCU-CHEK GUIDE TEST) test strip Use to check blood sugar 3 times daily. 100 each 6   insulin  glargine (LANTUS  SOLOSTAR) 100 UNIT/ML Solostar Pen Inject 36 Units into the skin daily. 15 mL 1   Insulin  Pen Needle (TECHLITE PLUS PEN NEEDLES) 32G X 4 MM MISC Use to inject insulin  once daily. 100 each 0   loratadine  (CLARITIN ) 10 MG tablet Take 1 tablet (10 mg total) by mouth daily. 90 tablet 1   omeprazole  (PRILOSEC) 20 MG capsule Take 1 capsule (20 mg total) by mouth daily. 30 capsule 3   traZODone  (DESYREL ) 50 MG tablet Take 0.5-1 tablets (25-50 mg total) by mouth at bedtime as needed for sleep. 90  tablet 1   valsartan -hydrochlorothiazide  (DIOVAN -HCT) 320-25 MG tablet Take 1 tablet by mouth daily. 90 tablet 1   No current facility-administered medications on file prior to visit.    Review of Systems Comprehensive ROS Pertinent positive and negative noted in HPI   Objective:  BP (!) 142/89   Pulse 86   Resp 16   Wt 266 lb (120.7 kg)   SpO2 97%   BMI 35.09 kg/m   BP Readings from Last 3 Encounters:   12/03/23 (!) 142/89  08/29/23 133/85  08/27/23 109/74    Wt Readings from Last 3 Encounters:  12/03/23 266 lb (120.7 kg)  08/29/23 256 lb 9.6 oz (116.4 kg)  08/27/23 252 lb 6.4 oz (114.5 kg)    Physical Exam Vitals reviewed.  Constitutional:      Appearance: He is obese.  HENT:     Head: Normocephalic.     Right Ear: Tympanic membrane and external ear normal.     Left Ear: Tympanic membrane and external ear normal.     Nose: Nose normal.  Eyes:     Extraocular Movements: Extraocular movements intact.     Pupils: Pupils are equal, round, and reactive to light.  Cardiovascular:     Rate and Rhythm: Normal rate and regular rhythm.  Pulmonary:     Effort: Pulmonary effort is normal.     Breath sounds: Normal breath sounds.  Abdominal:     General: Bowel sounds are normal. There is distension.     Palpations: Abdomen is soft.  Musculoskeletal:        General: Normal range of motion.  Skin:    General: Skin is warm and dry.  Neurological:     Mental Status: He is oriented to person, place, and time.  Psychiatric:        Mood and Affect: Mood normal.        Behavior: Behavior normal.        Thought Content: Thought content normal.        Judgment: Judgment normal.    Lab Results  Component Value Date   HGBA1C 7.6 (A) 08/29/2023   HGBA1C 7.5 (A) 05/24/2023   HGBA1C 7.9 (A) 12/21/2022    Lab Results  Component Value Date   WBC 5.7 05/24/2023   HGB 14.9 05/24/2023   HCT 47.1 05/24/2023   PLT 274 05/24/2023   GLUCOSE CANCELED 05/24/2023   CHOL 101 05/24/2023   TRIG 102 05/24/2023   HDL 35 (L) 05/24/2023   LDLCALC 47 05/24/2023   ALT 19 05/24/2023   AST 20 05/24/2023   NA 141 05/24/2023   K CANCELED 05/24/2023   CL 97 05/24/2023   CREATININE 1.22 05/24/2023   BUN 18 05/24/2023   CO2 24 05/24/2023   TSH 1.156 04/21/2020   INR 1.1 06/15/2020   HGBA1C 7.6 (A) 08/29/2023    Title   Diabetic Foot Exam - detailed    Semmes-Weinstein Monofilament Test +  means has sensation and - means no sensation      Image components are not supported.   Image components are not supported. Image components are not supported.  [place] Comments      Assessment & Plan:  Greycen was seen today for diabetes and hypertension.  Diagnoses and all orders for this visit:  Type 2 diabetes mellitus with hyperglycemia, with long-term current use of insulin  (HCC) - educated on lifestyle modifications, including but not limited to diet choices and adding exercise to daily routine.   -  POCT glycosylated hemoglobin (Hb A1C)  Essential hypertension BP goal - < 130/80 Explained that having normal blood pressure is the goal and medications are helping to get to goal and maintain normal blood pressure. DIET: Limit salt intake, read nutrition labels to check salt content, limit fried and high fatty foods  Avoid using multisymptom OTC cold preparations that generally contain sudafed which can rise BP. Consult with pharmacist on best cold relief products to use for persons with HTN EXERCISE Discussed incorporating exercise such as walking - 30 minutes most days of the week and can do in 10 minute intervals       Follow-up:  Return in about 3 months (around 03/04/2024).  The above assessment and management plan was discussed with the patient. The patient verbalized understanding of and has agreed to the management plan. Patient is aware to call the clinic if symptoms fail to improve or worsen. Patient is aware when to return to the clinic for a follow-up visit. Patient educated on when it is appropriate to go to the emergency department.   Rosaline Bohr, NP-C

## 2023-12-07 LAB — POCT GLYCOSYLATED HEMOGLOBIN (HGB A1C): HbA1c, POC (controlled diabetic range): 7.4 % — AB (ref 0.0–7.0)

## 2023-12-11 ENCOUNTER — Other Ambulatory Visit: Payer: Self-pay

## 2023-12-11 ENCOUNTER — Other Ambulatory Visit (INDEPENDENT_AMBULATORY_CARE_PROVIDER_SITE_OTHER): Payer: Self-pay | Admitting: Primary Care

## 2023-12-11 DIAGNOSIS — E1165 Type 2 diabetes mellitus with hyperglycemia: Secondary | ICD-10-CM

## 2023-12-12 ENCOUNTER — Other Ambulatory Visit: Payer: Self-pay

## 2023-12-12 MED ORDER — LANTUS SOLOSTAR 100 UNIT/ML ~~LOC~~ SOPN
36.0000 [IU] | PEN_INJECTOR | Freq: Every day | SUBCUTANEOUS | 1 refills | Status: AC
  Filled 2023-12-12: qty 15, 41d supply, fill #0
  Filled 2024-02-04: qty 15, 41d supply, fill #1

## 2023-12-20 ENCOUNTER — Other Ambulatory Visit: Payer: Self-pay

## 2023-12-21 ENCOUNTER — Other Ambulatory Visit (INDEPENDENT_AMBULATORY_CARE_PROVIDER_SITE_OTHER): Payer: Self-pay | Admitting: Primary Care

## 2023-12-21 ENCOUNTER — Other Ambulatory Visit: Payer: Self-pay

## 2023-12-21 MED ORDER — ACCU-CHEK GUIDE TEST VI STRP
ORAL_STRIP | 6 refills | Status: AC
  Filled 2023-12-21: qty 100, 33d supply, fill #0
  Filled 2024-02-04: qty 100, 33d supply, fill #1

## 2023-12-21 NOTE — Telephone Encounter (Signed)
 Pt is completely out of his current supply, please advise. [name] Coffer called to report this.

## 2024-01-04 ENCOUNTER — Other Ambulatory Visit: Payer: Self-pay

## 2024-01-09 ENCOUNTER — Ambulatory Visit: Admitting: Podiatry

## 2024-01-09 ENCOUNTER — Encounter: Payer: Self-pay | Admitting: Podiatry

## 2024-01-09 ENCOUNTER — Other Ambulatory Visit: Payer: Self-pay

## 2024-01-09 DIAGNOSIS — B351 Tinea unguium: Secondary | ICD-10-CM

## 2024-01-09 DIAGNOSIS — M79674 Pain in right toe(s): Secondary | ICD-10-CM | POA: Diagnosis not present

## 2024-01-09 DIAGNOSIS — M79675 Pain in left toe(s): Secondary | ICD-10-CM

## 2024-01-09 DIAGNOSIS — E1165 Type 2 diabetes mellitus with hyperglycemia: Secondary | ICD-10-CM

## 2024-01-09 DIAGNOSIS — Z794 Long term (current) use of insulin: Secondary | ICD-10-CM

## 2024-01-09 NOTE — Progress Notes (Signed)
This patient returns to my office for at risk foot care.  This patient requires this care by a professional since this patient will be at risk due to having diabetes.  This patient is unable to cut nails himself since the patient cannot reach his nails.These nails are painful walking and wearing shoes.  This patient presents for at risk foot care today.  General Appearance  Alert, conversant and in no acute stress.  Vascular  Dorsalis pedis and posterior tibial  pulses are palpable  bilaterally.  Capillary return is within normal limits  bilaterally. Temperature is within normal limits  bilaterally.  Neurologic  Senn-Weinstein monofilament wire test within normal limits  bilaterally. Muscle power within normal limits bilaterally.  Nails Thick disfigured discolored nails with subungual debris  from hallux to fifth toes bilaterally. No evidence of bacterial infection or drainage bilaterally.  Orthopedic  No limitations of motion  feet .  No crepitus or effusions noted.  No bony pathology or digital deformities noted.  Skin  normotropic skin with no porokeratosis noted bilaterally.  No signs of infections or ulcers noted.     Onychomycosis  Pain in right toes  Pain in left toes  Consent was obtained for treatment procedures.   Mechanical debridement of nails 1-5  bilaterally performed with a nail nipper.  Filed with dremel without incident.    Return office visit    6 months                  Told patient to return for periodic foot care and evaluation due to potential at risk complications.   Gardiner Barefoot DPM

## 2024-01-15 ENCOUNTER — Other Ambulatory Visit: Payer: Self-pay

## 2024-01-18 ENCOUNTER — Other Ambulatory Visit: Payer: Self-pay

## 2024-01-23 ENCOUNTER — Other Ambulatory Visit: Payer: Self-pay

## 2024-01-24 ENCOUNTER — Other Ambulatory Visit: Payer: Self-pay

## 2024-01-31 ENCOUNTER — Encounter (HOSPITAL_COMMUNITY): Payer: Self-pay

## 2024-01-31 ENCOUNTER — Ambulatory Visit (HOSPITAL_COMMUNITY)
Admission: EM | Admit: 2024-01-31 | Discharge: 2024-01-31 | Disposition: A | Discharge status: Home / Self Care | Attending: Family Medicine | Admitting: Family Medicine

## 2024-01-31 DIAGNOSIS — S61210A Laceration without foreign body of right index finger without damage to nail, initial encounter: Secondary | ICD-10-CM | POA: Diagnosis not present

## 2024-01-31 DIAGNOSIS — Z23 Encounter for immunization: Secondary | ICD-10-CM

## 2024-01-31 MED ORDER — TETANUS-DIPHTH-ACELL PERTUSSIS 5-2-15.5 LF-MCG/0.5 IM SUSP
INTRAMUSCULAR | Status: AC
  Filled 2024-01-31: qty 0.5

## 2024-01-31 MED ORDER — TETANUS-DIPHTH-ACELL PERTUSSIS 5-2-15.5 LF-MCG/0.5 IM SUSP
0.5000 mL | Freq: Once | INTRAMUSCULAR | Status: AC
  Administered 2024-01-31: 0.5 mL via INTRAMUSCULAR

## 2024-01-31 NOTE — ED Provider Notes (Signed)
 " [facility]   [id number] 01/31/24 Arrival Time: 1733  ASSESSMENT & PLAN:  1. Laceration of right index finger without foreign body without damage to nail, initial encounter     Meds ordered this encounter  Medications   Tdap (ADACEL ) injection 0.5 mL   No repair needed.  Reviewed expectations re: course of current medical issues. Questions answered. Outlined signs and symptoms indicating need for more acute intervention. Patient verbalized understanding. After Visit Summary given.   SUBJECTIVE:  Rhonda Vangieson is a 49 y.o. male who presents with a laceration of R index finger; at tip next to nail edge; minimal bleeding. Unsure of Td. Denies extremity sensation changes or weakness.   Health Maintenance Due  Topic Date Due   OPHTHALMOLOGY EXAM  Never done   FOOT EXAM  06/20/2023   COVID-19 Vaccine (3 - 2025-26 season) 09/17/2023    OBJECTIVE:  Vitals:   01/31/24 1750  BP: (!) 143/92  Pulse: 94  Resp: 16  Temp: 99.1 F (37.3 C)  TempSrc: Oral  SpO2: 92%     General appearance: alert; no distress RUE: v-shaped laceration of tip of index finger at nail edge; size: approx 1-40mm; clean wound edges, no foreign bodies; without active bleeding Psychological: alert and cooperative; normal mood and affect    Labs Reviewed - No data to display  No results found.  Allergies[1]  Past Medical History:  Diagnosis Date   Diabetes mellitus without complication (HCC)    Hypertension    Stroke [facility])    Social History   Socioeconomic History   Marital status: Single    Spouse name: Not on file   Number of children: 2   Years of education: Not on file   Highest education level: Bachelor's degree (e.g., BA, AB, BS)  Occupational History   Occupation: disable  Tobacco Use   Smoking status: Former    Types: Cigarettes   Smokeless tobacco: Never  Vaping Use   Vaping status: Never Used  Substance and Sexual Activity   Alcohol use: Yes    Comment:  Occasionally   Drug use: Never   Sexual activity: Not Currently  Other Topics Concern   Not on file  Social History Narrative   Not on file   Social Drivers of Health   Tobacco Use: Medium Risk (01/31/2024)   Patient History    Smoking Tobacco Use: Former    Smokeless Tobacco Use: Never    Passive Exposure: Not on Actuary Strain: Low Risk (12/19/2022)   Overall Financial Resource Strain (CARDIA)    Difficulty of Paying Living Expenses: Not hard at all  Food Insecurity: No Food Insecurity (05/24/2023)   Hunger Vital Sign    Worried About Running Out of Food in the Last Year: Never true    Ran Out of Food in the Last Year: Never true  Transportation Needs: No Transportation Needs (05/24/2023)   PRAPARE - Administrator, Civil Service (Medical): No    Lack of Transportation (Non-Medical): No  Physical Activity: Unknown (12/19/2022)   Exercise Vital Sign    Days of Exercise per Week: 0 days    Minutes of Exercise per Session: Not on file  Recent Concern: Physical Activity - Inactive (12/19/2022)   Exercise Vital Sign    Days of Exercise per Week: 0 days    Minutes of Exercise per Session: 0 min  Stress: No Stress Concern Present (12/19/2022)   [employer] of Occupational Health - Occupational Stress  Questionnaire    Feeling of Stress : Only a little  Social Connections: Socially Integrated (12/19/2022)   Social Connection and Isolation Panel    Frequency of Communication with Friends and Family: Three times a week    Frequency of Social Gatherings with Friends and Family: Once a week    Attends Religious Services: More than 4 times per year    Active Member of Clubs or Organizations: Yes    Attends Banker Meetings: More than 4 times per year    Marital Status: Living with partner  Depression (PHQ2-9): Low Risk (05/24/2023)   Depression (PHQ2-9)    PHQ-2 Score: 1  Alcohol Screen: Low Risk (12/19/2022)   Alcohol Screen    Last Alcohol  Screening Score (AUDIT): 3  Housing: Low Risk (05/24/2023)   Housing Stability Vital Sign    Unable to Pay for Housing in the Last Year: No    Number of Times Moved in the Last Year: 0    Homeless in the Last Year: No  Utilities: Not At Risk (05/24/2023)   [employer] Utilities    Threatened with loss of utilities: No  Health Literacy: Not on file           [1]  Allergies Allergen Reactions   Milk-Related Compounds      Rolinda Rogue, MD 01/31/24 1921  "

## 2024-01-31 NOTE — ED Triage Notes (Addendum)
 Patient states he was opening a package with a knife and cut his right index finger. Still bleeding during triage.  Patient has not had any medication prior to arrival to the UC.   Patient does not remember when his last Tetanus was.

## 2024-02-04 ENCOUNTER — Other Ambulatory Visit (INDEPENDENT_AMBULATORY_CARE_PROVIDER_SITE_OTHER): Payer: Self-pay | Admitting: Primary Care

## 2024-02-04 DIAGNOSIS — I10 Essential (primary) hypertension: Secondary | ICD-10-CM

## 2024-02-05 ENCOUNTER — Other Ambulatory Visit: Payer: Self-pay

## 2024-02-05 MED ORDER — CARVEDILOL 6.25 MG PO TABS
6.2500 mg | ORAL_TABLET | Freq: Two times a day (BID) | ORAL | 0 refills | Status: AC
  Filled 2024-02-05: qty 180, 90d supply, fill #0

## 2024-02-17 ENCOUNTER — Other Ambulatory Visit (INDEPENDENT_AMBULATORY_CARE_PROVIDER_SITE_OTHER): Payer: Self-pay | Admitting: Primary Care

## 2024-02-17 DIAGNOSIS — F32A Depression, unspecified: Secondary | ICD-10-CM

## 2024-02-18 NOTE — Telephone Encounter (Signed)
 Will forward to provider

## 2024-02-19 ENCOUNTER — Other Ambulatory Visit: Payer: Self-pay

## 2024-02-20 ENCOUNTER — Ambulatory Visit: Payer: Self-pay

## 2024-02-20 NOTE — Telephone Encounter (Signed)
 FYI Only or Action Required?: Action required by provider: clinical question for provider.  Patient was last seen in primary care on 12/03/2023 by Nathaniel Rosaline SQUIBB, NP.  Called Nurse Triage reporting Medication Problem.   Triage Disposition: Duplicate Contact Calls  Patient/caregiver understands and will follow disposition?: Yes    Copied from CRM [id number]. Topic: Clinical - Medication Question >> Feb 20, 2024 12:21 PM Nathaniel Spears wrote: Reason for CRM: patients girlfriend called to make sure his traZODone  (DESYREL ) 50 MG tablet is filled today as he took his last dosage on last night. Please f/u with patient   Reason for Disposition  Caller has already spoken with the doctor (or NP/PA, pharmacist) and has no further questions.  Answer Assessment - Initial Assessment Questions Rx is pending from clinic message 02/18/24. Will route to clinic as staff is already aware of rx request.  Protocols used: No Contact or Duplicate Contact Call-A-AH

## 2024-02-22 ENCOUNTER — Other Ambulatory Visit: Payer: Self-pay

## 2024-02-22 ENCOUNTER — Other Ambulatory Visit (INDEPENDENT_AMBULATORY_CARE_PROVIDER_SITE_OTHER): Payer: Self-pay | Admitting: Primary Care

## 2024-02-22 ENCOUNTER — Telehealth (INDEPENDENT_AMBULATORY_CARE_PROVIDER_SITE_OTHER): Payer: Self-pay | Admitting: Primary Care

## 2024-02-22 ENCOUNTER — Other Ambulatory Visit (INDEPENDENT_AMBULATORY_CARE_PROVIDER_SITE_OTHER): Payer: Self-pay

## 2024-02-22 DIAGNOSIS — F32A Depression, unspecified: Secondary | ICD-10-CM

## 2024-02-22 MED ORDER — TRAZODONE HCL 50 MG PO TABS: 25.0000 mg | ORAL_TABLET | Freq: Every evening | ORAL | 1 refills | Status: AC | PRN

## 2024-02-22 NOTE — Telephone Encounter (Signed)
 Please reach pt and make aware rx has been sent

## 2024-02-22 NOTE — Telephone Encounter (Signed)
 Pt came in stating that he needed a refill on medication (Trazodone  50 MG). Pt is having a hard time sleeping at night. Please advise

## 2024-03-04 ENCOUNTER — Ambulatory Visit (INDEPENDENT_AMBULATORY_CARE_PROVIDER_SITE_OTHER): Admitting: Primary Care

## 2024-04-08 ENCOUNTER — Ambulatory Visit: Admitting: Podiatry
